# Patient Record
Sex: Female | Born: 1945
Health system: Southern US, Community
[De-identification: ages and names within clinical notes are randomized; demographics above are authoritative.]

## PROBLEM LIST (undated history)

## (undated) DIAGNOSIS — L309 Dermatitis, unspecified: Secondary | ICD-10-CM

## (undated) DIAGNOSIS — K219 Gastro-esophageal reflux disease without esophagitis: Secondary | ICD-10-CM

## (undated) DIAGNOSIS — A4151 Sepsis due to Escherichia coli [E. coli]: Secondary | ICD-10-CM

## (undated) DIAGNOSIS — Z86010 Personal history of colonic polyps: Secondary | ICD-10-CM

## (undated) DIAGNOSIS — K760 Fatty (change of) liver, not elsewhere classified: Secondary | ICD-10-CM

## (undated) DIAGNOSIS — Z9289 Personal history of other medical treatment: Secondary | ICD-10-CM

## (undated) DIAGNOSIS — E119 Type 2 diabetes mellitus without complications: Secondary | ICD-10-CM

## (undated) DIAGNOSIS — M1990 Unspecified osteoarthritis, unspecified site: Secondary | ICD-10-CM

## (undated) DIAGNOSIS — C801 Malignant (primary) neoplasm, unspecified: Secondary | ICD-10-CM

## (undated) DIAGNOSIS — I639 Cerebral infarction, unspecified: Secondary | ICD-10-CM

## (undated) DIAGNOSIS — I10 Essential (primary) hypertension: Secondary | ICD-10-CM

## (undated) HISTORY — DX: Fatty (change of) liver, not elsewhere classified: K76.0

## (undated) HISTORY — PX: CATARACT EXTRACTION: SUR2

## (undated) HISTORY — DX: Type 2 diabetes mellitus without complications: E11.9

## (undated) HISTORY — DX: Essential (primary) hypertension: I10

## (undated) HISTORY — DX: Malignant (primary) neoplasm, unspecified: C80.1

## (undated) HISTORY — PX: MASTECTOMY: SHX3

## (undated) HISTORY — DX: Cerebral infarction, unspecified: I63.9

## (undated) HISTORY — DX: Personal history of colonic polyps: Z86.010

## (undated) HISTORY — PX: TUBAL LIGATION: SHX77

## (undated) HISTORY — DX: Dermatitis, unspecified: L30.9

---

## 1999-10-29 ENCOUNTER — Encounter (INDEPENDENT_AMBULATORY_CARE_PROVIDER_SITE_OTHER): Payer: Self-pay | Admitting: Specialist

## 1999-10-29 ENCOUNTER — Other Ambulatory Visit: Admission: RE | Admit: 1999-10-29 | Discharge: 1999-10-29 | Payer: Self-pay | Admitting: Obstetrics and Gynecology

## 1999-10-31 ENCOUNTER — Encounter: Payer: Self-pay | Admitting: Obstetrics and Gynecology

## 1999-10-31 ENCOUNTER — Encounter: Admission: RE | Admit: 1999-10-31 | Discharge: 1999-10-31 | Payer: Self-pay | Admitting: Obstetrics and Gynecology

## 2002-03-22 ENCOUNTER — Other Ambulatory Visit: Admission: RE | Admit: 2002-03-22 | Discharge: 2002-03-22 | Payer: Self-pay | Admitting: Internal Medicine

## 2003-11-04 ENCOUNTER — Emergency Department (HOSPITAL_COMMUNITY): Admission: EM | Admit: 2003-11-04 | Discharge: 2003-11-05 | Payer: Self-pay | Admitting: Emergency Medicine

## 2004-04-09 ENCOUNTER — Encounter: Admission: RE | Admit: 2004-04-09 | Discharge: 2004-04-09 | Payer: Self-pay | Admitting: Internal Medicine

## 2004-06-14 ENCOUNTER — Ambulatory Visit: Payer: Self-pay | Admitting: Internal Medicine

## 2004-08-16 ENCOUNTER — Ambulatory Visit: Payer: Self-pay | Admitting: Internal Medicine

## 2004-12-13 ENCOUNTER — Ambulatory Visit: Payer: Self-pay | Admitting: Internal Medicine

## 2005-03-13 ENCOUNTER — Other Ambulatory Visit: Admission: RE | Admit: 2005-03-13 | Discharge: 2005-03-13 | Payer: Self-pay | Admitting: Internal Medicine

## 2005-03-13 ENCOUNTER — Ambulatory Visit: Payer: Self-pay | Admitting: Internal Medicine

## 2005-03-17 ENCOUNTER — Ambulatory Visit: Payer: Self-pay | Admitting: Internal Medicine

## 2005-03-20 ENCOUNTER — Ambulatory Visit: Payer: Self-pay | Admitting: Internal Medicine

## 2005-05-06 ENCOUNTER — Ambulatory Visit: Payer: Self-pay | Admitting: Internal Medicine

## 2005-06-17 ENCOUNTER — Ambulatory Visit: Payer: Self-pay | Admitting: Internal Medicine

## 2005-07-11 ENCOUNTER — Ambulatory Visit: Payer: Self-pay | Admitting: Internal Medicine

## 2005-07-28 LAB — HM COLONOSCOPY

## 2005-11-10 ENCOUNTER — Ambulatory Visit: Payer: Self-pay | Admitting: Internal Medicine

## 2006-02-10 ENCOUNTER — Ambulatory Visit: Payer: Self-pay | Admitting: Family Medicine

## 2006-04-02 ENCOUNTER — Encounter (INDEPENDENT_AMBULATORY_CARE_PROVIDER_SITE_OTHER): Payer: Self-pay | Admitting: Specialist

## 2006-04-02 ENCOUNTER — Other Ambulatory Visit: Admission: RE | Admit: 2006-04-02 | Discharge: 2006-04-02 | Payer: Self-pay | Admitting: Internal Medicine

## 2006-04-02 ENCOUNTER — Ambulatory Visit: Payer: Self-pay | Admitting: Internal Medicine

## 2006-04-02 LAB — CONVERTED CEMR LAB: Pap Smear: NORMAL

## 2006-04-02 LAB — HM PAP SMEAR: HM Pap smear: NORMAL

## 2006-04-13 ENCOUNTER — Ambulatory Visit: Payer: Self-pay | Admitting: Family Medicine

## 2006-07-02 ENCOUNTER — Ambulatory Visit: Payer: Self-pay | Admitting: Internal Medicine

## 2006-07-02 LAB — CONVERTED CEMR LAB
ALT: 27 units/L (ref 0–40)
AST: 27 units/L (ref 0–37)
Albumin: 3.9 g/dL (ref 3.5–5.2)
Alkaline Phosphatase: 87 units/L (ref 39–117)
BUN: 11 mg/dL (ref 6–23)
Bilirubin, Direct: 0.1 mg/dL (ref 0.0–0.3)
CO2: 27 meq/L (ref 19–32)
Calcium: 9.7 mg/dL (ref 8.4–10.5)
Chloride: 102 meq/L (ref 96–112)
Chol/HDL Ratio, serum: 3.2
Cholesterol: 170 mg/dL (ref 0–200)
Creatinine, Ser: 1.3 mg/dL — ABNORMAL HIGH (ref 0.4–1.2)
GFR calc non Af Amer: 45 mL/min
Glomerular Filtration Rate, Af Am: 54 mL/min/{1.73_m2}
Glucose, Bld: 328 mg/dL — ABNORMAL HIGH (ref 70–99)
HDL: 52.9 mg/dL (ref >39.0)
Hgb A1c MFr Bld: 8.5 % — ABNORMAL HIGH (ref 4.6–6.0)
LDL DIRECT: 95.8 mg/dL
Potassium: 4.5 meq/L (ref 3.5–5.1)
Sodium: 137 meq/L (ref 135–145)
Total Bilirubin: 0.9 mg/dL (ref 0.3–1.2)
Total Protein: 6.2 g/dL (ref 6.0–8.3)
Triglyceride fasting, serum: 202 mg/dL (ref 0–149)
VLDL: 40 mg/dL (ref 0–40)

## 2006-07-08 ENCOUNTER — Ambulatory Visit: Payer: Self-pay | Admitting: Internal Medicine

## 2006-08-19 ENCOUNTER — Ambulatory Visit: Payer: Self-pay | Admitting: Internal Medicine

## 2006-10-08 ENCOUNTER — Ambulatory Visit: Payer: Self-pay | Admitting: Internal Medicine

## 2006-10-08 LAB — CONVERTED CEMR LAB
ALT: 21 units/L (ref 0–40)
AST: 25 units/L (ref 0–37)
Albumin: 3.7 g/dL (ref 3.5–5.2)
Alkaline Phosphatase: 79 units/L (ref 39–117)
BUN: 10 mg/dL (ref 6–23)
Bilirubin, Direct: 0.1 mg/dL (ref 0.0–0.3)
CO2: 33 meq/L — ABNORMAL HIGH (ref 19–32)
Calcium: 9.6 mg/dL (ref 8.4–10.5)
Chloride: 105 meq/L (ref 96–112)
Cholesterol: 165 mg/dL (ref 0–200)
Creatinine, Ser: 1 mg/dL (ref 0.4–1.2)
GFR calc Af Amer: 73 mL/min
GFR calc non Af Amer: 60 mL/min
Glucose, Bld: 129 mg/dL — ABNORMAL HIGH (ref 70–99)
HDL: 62.6 mg/dL (ref >39.0)
Hgb A1c MFr Bld: 6.7 % — ABNORMAL HIGH (ref 4.6–6.0)
LDL Cholesterol: 77 mg/dL (ref 0–99)
Potassium: 4.5 meq/L (ref 3.5–5.1)
Sodium: 145 meq/L (ref 135–145)
Total Bilirubin: 0.9 mg/dL (ref 0.3–1.2)
Total CHOL/HDL Ratio: 2.6
Total Protein: 7 g/dL (ref 6.0–8.3)
Triglycerides: 128 mg/dL (ref 0–149)
VLDL: 26 mg/dL (ref 0–40)

## 2006-10-29 ENCOUNTER — Ambulatory Visit: Payer: Self-pay | Admitting: Internal Medicine

## 2006-12-10 ENCOUNTER — Encounter: Payer: Self-pay | Admitting: Internal Medicine

## 2006-12-10 ENCOUNTER — Ambulatory Visit: Payer: Self-pay | Admitting: Internal Medicine

## 2006-12-10 DIAGNOSIS — Z853 Personal history of malignant neoplasm of breast: Secondary | ICD-10-CM | POA: Insufficient documentation

## 2006-12-10 DIAGNOSIS — E1129 Type 2 diabetes mellitus with other diabetic kidney complication: Secondary | ICD-10-CM | POA: Insufficient documentation

## 2006-12-10 DIAGNOSIS — Z860100 Personal history of colon polyps, unspecified: Secondary | ICD-10-CM

## 2006-12-10 DIAGNOSIS — E119 Type 2 diabetes mellitus without complications: Secondary | ICD-10-CM

## 2006-12-10 DIAGNOSIS — I10 Essential (primary) hypertension: Secondary | ICD-10-CM | POA: Insufficient documentation

## 2006-12-10 HISTORY — DX: Personal history of colon polyps, unspecified: Z86.0100

## 2006-12-10 HISTORY — DX: Type 2 diabetes mellitus without complications: E11.9

## 2006-12-10 HISTORY — DX: Personal history of colonic polyps: Z86.010

## 2006-12-31 ENCOUNTER — Encounter: Payer: Self-pay | Admitting: Internal Medicine

## 2006-12-31 ENCOUNTER — Ambulatory Visit: Payer: Self-pay | Admitting: Internal Medicine

## 2007-02-01 ENCOUNTER — Encounter: Payer: Self-pay | Admitting: Internal Medicine

## 2007-03-09 ENCOUNTER — Ambulatory Visit: Payer: Self-pay | Admitting: Internal Medicine

## 2007-03-09 LAB — CONVERTED CEMR LAB
ALT: 24 units/L (ref 0–35)
AST: 25 units/L (ref 0–37)
Albumin: 3.7 g/dL (ref 3.5–5.2)
Alkaline Phosphatase: 72 units/L (ref 39–117)
BUN: 11 mg/dL (ref 6–23)
Bilirubin, Direct: 0.2 mg/dL (ref 0.0–0.3)
CO2: 29 meq/L (ref 19–32)
Calcium: 9.7 mg/dL (ref 8.4–10.5)
Chloride: 106 meq/L (ref 96–112)
Cholesterol: 163 mg/dL (ref 0–200)
Creatinine, Ser: 0.9 mg/dL (ref 0.4–1.2)
GFR calc Af Amer: 82 mL/min
GFR calc non Af Amer: 68 mL/min
Glucose, Bld: 172 mg/dL — ABNORMAL HIGH (ref 70–99)
HDL: 60.2 mg/dL (ref >39.0)
Hgb A1c MFr Bld: 6.3 % — ABNORMAL HIGH (ref 4.6–6.0)
LDL Cholesterol: 84 mg/dL (ref 0–99)
Potassium: 4.4 meq/L (ref 3.5–5.1)
Sodium: 141 meq/L (ref 135–145)
Total Bilirubin: 0.9 mg/dL (ref 0.3–1.2)
Total CHOL/HDL Ratio: 2.7
Total Protein: 6.5 g/dL (ref 6.0–8.3)
Triglycerides: 95 mg/dL (ref 0–149)
VLDL: 19 mg/dL (ref 0–40)

## 2007-03-16 ENCOUNTER — Ambulatory Visit: Payer: Self-pay | Admitting: Internal Medicine

## 2007-04-02 ENCOUNTER — Encounter: Payer: Self-pay | Admitting: Internal Medicine

## 2007-05-17 ENCOUNTER — Telehealth: Payer: Self-pay | Admitting: Internal Medicine

## 2007-05-27 ENCOUNTER — Telehealth: Payer: Self-pay | Admitting: Internal Medicine

## 2007-06-01 ENCOUNTER — Ambulatory Visit: Payer: Self-pay | Admitting: Internal Medicine

## 2007-06-17 ENCOUNTER — Encounter: Payer: Self-pay | Admitting: Internal Medicine

## 2007-06-21 ENCOUNTER — Telehealth: Payer: Self-pay | Admitting: Internal Medicine

## 2007-06-30 ENCOUNTER — Ambulatory Visit: Payer: Self-pay | Admitting: Internal Medicine

## 2007-06-30 LAB — CONVERTED CEMR LAB
ALT: 33 units/L (ref 0–35)
AST: 31 units/L (ref 0–37)
Albumin: 3.8 g/dL (ref 3.5–5.2)
Alkaline Phosphatase: 73 units/L (ref 39–117)
BUN: 8 mg/dL (ref 6–23)
Bilirubin, Direct: 0.2 mg/dL (ref 0.0–0.3)
CO2: 28 meq/L (ref 19–32)
Calcium: 9.9 mg/dL (ref 8.4–10.5)
Chloride: 104 meq/L (ref 96–112)
Cholesterol: 148 mg/dL (ref 0–200)
Creatinine, Ser: 0.9 mg/dL (ref 0.4–1.2)
Creatinine,U: 45.4 mg/dL
GFR calc Af Amer: 82 mL/min
GFR calc non Af Amer: 68 mL/min
Glucose, Bld: 126 mg/dL — ABNORMAL HIGH (ref 70–99)
HDL: 51.3 mg/dL (ref >39.0)
Hgb A1c MFr Bld: 7.4 % — ABNORMAL HIGH (ref 4.6–6.0)
LDL Cholesterol: 72 mg/dL (ref 0–99)
Microalb Creat Ratio: 13.2 mg/g (ref 0.0–30.0)
Microalb, Ur: 0.6 mg/dL (ref 0.0–1.9)
Potassium: 4.5 meq/L (ref 3.5–5.1)
Sodium: 141 meq/L (ref 135–145)
Total Bilirubin: 0.5 mg/dL (ref 0.3–1.2)
Total CHOL/HDL Ratio: 2.9
Total Protein: 6.6 g/dL (ref 6.0–8.3)
Triglycerides: 124 mg/dL (ref 0–149)
VLDL: 25 mg/dL (ref 0–40)

## 2007-07-07 ENCOUNTER — Ambulatory Visit: Payer: Self-pay | Admitting: Internal Medicine

## 2007-07-07 LAB — HM DIABETES FOOT EXAM

## 2007-10-18 ENCOUNTER — Ambulatory Visit: Payer: Self-pay | Admitting: Cardiology

## 2007-10-18 ENCOUNTER — Ambulatory Visit: Payer: Self-pay | Admitting: Internal Medicine

## 2007-10-18 ENCOUNTER — Inpatient Hospital Stay (HOSPITAL_COMMUNITY): Admission: EM | Admit: 2007-10-18 | Discharge: 2007-10-19 | Payer: Self-pay | Admitting: Emergency Medicine

## 2007-10-19 ENCOUNTER — Encounter: Payer: Self-pay | Admitting: Internal Medicine

## 2007-10-19 ENCOUNTER — Ambulatory Visit: Payer: Self-pay | Admitting: Vascular Surgery

## 2007-10-20 ENCOUNTER — Ambulatory Visit: Payer: Self-pay | Admitting: Internal Medicine

## 2007-10-20 DIAGNOSIS — Z8673 Personal history of transient ischemic attack (TIA), and cerebral infarction without residual deficits: Secondary | ICD-10-CM | POA: Insufficient documentation

## 2007-10-26 ENCOUNTER — Telehealth: Payer: Self-pay | Admitting: Internal Medicine

## 2007-10-27 ENCOUNTER — Encounter: Payer: Self-pay | Admitting: Internal Medicine

## 2007-11-03 ENCOUNTER — Ambulatory Visit: Payer: Self-pay | Admitting: Internal Medicine

## 2007-11-03 DIAGNOSIS — E785 Hyperlipidemia, unspecified: Secondary | ICD-10-CM | POA: Insufficient documentation

## 2007-11-03 LAB — CONVERTED CEMR LAB
ALT: 29 units/L (ref 0–35)
Calcium: 10.1 mg/dL (ref 8.4–10.5)
Creatinine, Ser: 1.1 mg/dL (ref 0.4–1.2)
GFR calc Af Amer: 65 mL/min
GFR calc non Af Amer: 54 mL/min
HDL: 57.5 mg/dL (ref >39.0)
Hgb A1c MFr Bld: 7.1 % — ABNORMAL HIGH (ref 4.6–6.0)
LDL Cholesterol: 85 mg/dL (ref 0–99)
Total Bilirubin: 1 mg/dL (ref 0.3–1.2)
Total CHOL/HDL Ratio: 2.8
Triglycerides: 101 mg/dL (ref 0–149)
VLDL: 20 mg/dL (ref 0–40)

## 2007-11-10 ENCOUNTER — Ambulatory Visit: Payer: Self-pay | Admitting: Internal Medicine

## 2008-02-02 ENCOUNTER — Telehealth: Payer: Self-pay | Admitting: Internal Medicine

## 2008-03-03 ENCOUNTER — Ambulatory Visit: Payer: Self-pay | Admitting: Internal Medicine

## 2008-03-03 LAB — CONVERTED CEMR LAB
Albumin: 3.9 g/dL (ref 3.5–5.2)
Alkaline Phosphatase: 70 units/L (ref 39–117)
BUN: 12 mg/dL (ref 6–23)
Cholesterol: 154 mg/dL (ref 0–200)
GFR calc Af Amer: 72 mL/min
Glucose, Bld: 164 mg/dL — ABNORMAL HIGH (ref 70–99)
HDL: 54.3 mg/dL (ref >39.0)
Potassium: 4.6 meq/L (ref 3.5–5.1)
Total Protein: 7 g/dL (ref 6.0–8.3)
Triglycerides: 100 mg/dL (ref 0–149)
VLDL: 20 mg/dL (ref 0–40)

## 2008-03-14 ENCOUNTER — Ambulatory Visit: Payer: Self-pay | Admitting: Internal Medicine

## 2008-04-27 ENCOUNTER — Ambulatory Visit: Payer: Self-pay | Admitting: Internal Medicine

## 2008-05-04 ENCOUNTER — Encounter: Payer: Self-pay | Admitting: Internal Medicine

## 2008-05-23 ENCOUNTER — Ambulatory Visit: Payer: Self-pay | Admitting: Family Medicine

## 2008-05-23 ENCOUNTER — Encounter: Payer: Self-pay | Admitting: Internal Medicine

## 2008-06-28 ENCOUNTER — Encounter: Payer: Self-pay | Admitting: Internal Medicine

## 2008-06-30 ENCOUNTER — Ambulatory Visit: Payer: Self-pay | Admitting: Internal Medicine

## 2008-07-03 LAB — CONVERTED CEMR LAB
ALT: 33 units/L (ref 0–35)
AST: 30 units/L (ref 0–37)
Alkaline Phosphatase: 71 units/L (ref 39–117)
Bilirubin, Direct: 0.1 mg/dL (ref 0.0–0.3)
CO2: 28 meq/L (ref 19–32)
Chloride: 104 meq/L (ref 96–112)
Creatinine, Ser: 0.9 mg/dL (ref 0.4–1.2)
Sodium: 140 meq/L (ref 135–145)
Total Bilirubin: 0.7 mg/dL (ref 0.3–1.2)
Total CHOL/HDL Ratio: 3.3
Triglycerides: 99 mg/dL (ref 0–149)

## 2008-07-07 ENCOUNTER — Ambulatory Visit: Payer: Self-pay | Admitting: Internal Medicine

## 2008-10-26 ENCOUNTER — Ambulatory Visit: Payer: Self-pay | Admitting: Internal Medicine

## 2008-10-26 LAB — CONVERTED CEMR LAB
BUN: 11 mg/dL (ref 6–23)
Bilirubin, Direct: 0 mg/dL (ref 0.0–0.3)
Chloride: 102 meq/L (ref 96–112)
Glucose, Bld: 234 mg/dL — ABNORMAL HIGH (ref 70–99)
Hgb A1c MFr Bld: 7.4 % — ABNORMAL HIGH (ref 4.6–6.5)
LDL Cholesterol: 92 mg/dL (ref 0–99)
Potassium: 4.2 meq/L (ref 3.5–5.1)
Total Bilirubin: 0.9 mg/dL (ref 0.3–1.2)
VLDL: 18.8 mg/dL (ref 0.0–40.0)

## 2008-11-23 ENCOUNTER — Ambulatory Visit: Payer: Self-pay | Admitting: Internal Medicine

## 2009-03-14 ENCOUNTER — Ambulatory Visit: Payer: Self-pay | Admitting: Internal Medicine

## 2009-03-14 LAB — CONVERTED CEMR LAB
ALT: 31 units/L (ref 0–35)
Albumin: 3.7 g/dL (ref 3.5–5.2)
GFR calc non Af Amer: 93.36 mL/min (ref >60)
Glucose, Bld: 179 mg/dL — ABNORMAL HIGH (ref 70–99)
HDL: 57.4 mg/dL (ref >39.00)
Potassium: 3.9 meq/L (ref 3.5–5.1)
Sodium: 146 meq/L — ABNORMAL HIGH (ref 135–145)
TSH: 0.96 microintl units/mL (ref 0.35–5.50)
Total Bilirubin: 0.7 mg/dL (ref 0.3–1.2)
Total CHOL/HDL Ratio: 3
VLDL: 27.6 mg/dL (ref 0.0–40.0)

## 2009-03-21 ENCOUNTER — Ambulatory Visit: Payer: Self-pay | Admitting: Internal Medicine

## 2009-05-07 ENCOUNTER — Encounter: Payer: Self-pay | Admitting: Internal Medicine

## 2009-06-04 ENCOUNTER — Encounter (INDEPENDENT_AMBULATORY_CARE_PROVIDER_SITE_OTHER): Payer: Self-pay | Admitting: *Deleted

## 2009-06-05 ENCOUNTER — Ambulatory Visit: Payer: Self-pay | Admitting: Internal Medicine

## 2009-07-05 ENCOUNTER — Ambulatory Visit: Payer: Self-pay | Admitting: Internal Medicine

## 2009-07-06 ENCOUNTER — Encounter: Payer: Self-pay | Admitting: Internal Medicine

## 2009-07-09 LAB — CONVERTED CEMR LAB
Alkaline Phosphatase: 65 units/L (ref 39–117)
Bilirubin, Direct: 0.2 mg/dL (ref 0.0–0.3)
Calcium: 9.6 mg/dL (ref 8.4–10.5)
Creatinine, Ser: 0.9 mg/dL (ref 0.4–1.2)
GFR calc non Af Amer: 81.41 mL/min (ref >60)
HDL: 57.6 mg/dL (ref >39.00)
LDL Cholesterol: 80 mg/dL (ref 0–99)
Sodium: 142 meq/L (ref 135–145)
Total Bilirubin: 0.9 mg/dL (ref 0.3–1.2)
Total CHOL/HDL Ratio: 3
Triglycerides: 119 mg/dL (ref 0.0–149.0)
VLDL: 23.8 mg/dL (ref 0.0–40.0)

## 2009-07-13 ENCOUNTER — Ambulatory Visit: Payer: Self-pay | Admitting: Internal Medicine

## 2009-07-13 DIAGNOSIS — R740 Nonspecific elevation of levels of transaminase and lactic acid dehydrogenase [LDH]: Secondary | ICD-10-CM

## 2009-07-13 DIAGNOSIS — R7401 Elevation of levels of liver transaminase levels: Secondary | ICD-10-CM | POA: Insufficient documentation

## 2009-08-08 ENCOUNTER — Encounter: Payer: Self-pay | Admitting: Internal Medicine

## 2009-08-10 ENCOUNTER — Encounter: Payer: Self-pay | Admitting: Internal Medicine

## 2009-11-01 ENCOUNTER — Ambulatory Visit: Payer: Self-pay | Admitting: Internal Medicine

## 2009-11-01 LAB — CONVERTED CEMR LAB
ALT: 50 units/L — ABNORMAL HIGH (ref 0–35)
AST: 52 units/L — ABNORMAL HIGH (ref 0–37)
Albumin: 4.3 g/dL (ref 3.5–5.2)
BUN: 14 mg/dL (ref 6–23)
Chloride: 104 meq/L (ref 96–112)
Cholesterol: 178 mg/dL (ref 0–200)
Creatinine, Ser: 1 mg/dL (ref 0.4–1.2)
GFR calc non Af Amer: 72.01 mL/min (ref >60)
Glucose, Bld: 152 mg/dL — ABNORMAL HIGH (ref 70–99)
HDL: 75.1 mg/dL (ref >39.00)
LDL Cholesterol: 79 mg/dL (ref 0–99)
Potassium: 4 meq/L (ref 3.5–5.1)
Total Bilirubin: 1.3 mg/dL — ABNORMAL HIGH (ref 0.3–1.2)
Total CHOL/HDL Ratio: 2
Triglycerides: 119 mg/dL (ref 0.0–149.0)

## 2009-11-09 ENCOUNTER — Ambulatory Visit: Payer: Self-pay | Admitting: Internal Medicine

## 2010-01-17 ENCOUNTER — Encounter: Payer: Self-pay | Admitting: Internal Medicine

## 2010-03-01 ENCOUNTER — Ambulatory Visit: Payer: Self-pay | Admitting: Internal Medicine

## 2010-03-01 LAB — CONVERTED CEMR LAB
ALT: 63 units/L — ABNORMAL HIGH (ref 0–35)
Albumin: 4.1 g/dL (ref 3.5–5.2)
Alkaline Phosphatase: 77 units/L (ref 39–117)
BUN: 12 mg/dL (ref 6–23)
Bilirubin, Direct: 0.2 mg/dL (ref 0.0–0.3)
Calcium: 9.7 mg/dL (ref 8.4–10.5)
Cholesterol: 174 mg/dL (ref 0–200)
Creatinine, Ser: 1 mg/dL (ref 0.4–1.2)
GFR calc non Af Amer: 73.64 mL/min (ref >60)
Hgb A1c MFr Bld: 7.1 % — ABNORMAL HIGH (ref 4.6–6.5)
LDL Cholesterol: 82 mg/dL (ref 0–99)
Total Protein: 7.2 g/dL (ref 6.0–8.3)
Triglycerides: 105 mg/dL (ref 0.0–149.0)

## 2010-03-08 ENCOUNTER — Ambulatory Visit: Payer: Self-pay | Admitting: Internal Medicine

## 2010-05-02 ENCOUNTER — Ambulatory Visit: Payer: Self-pay | Admitting: Internal Medicine

## 2010-07-02 ENCOUNTER — Ambulatory Visit: Payer: Self-pay | Admitting: Internal Medicine

## 2010-07-02 ENCOUNTER — Telehealth: Payer: Self-pay | Admitting: Internal Medicine

## 2010-07-02 DIAGNOSIS — L259 Unspecified contact dermatitis, unspecified cause: Secondary | ICD-10-CM | POA: Insufficient documentation

## 2010-07-05 ENCOUNTER — Ambulatory Visit: Payer: Self-pay | Admitting: Internal Medicine

## 2010-07-05 LAB — CONVERTED CEMR LAB
AST: 49 units/L — ABNORMAL HIGH (ref 0–37)
Albumin: 4.2 g/dL (ref 3.5–5.2)
CO2: 26 meq/L (ref 19–32)
Chloride: 105 meq/L (ref 96–112)
Glucose, Bld: 151 mg/dL — ABNORMAL HIGH (ref 70–99)
HDL: 72.7 mg/dL (ref >39.00)
LDL Cholesterol: 107 mg/dL — ABNORMAL HIGH (ref 0–99)
Sodium: 144 meq/L (ref 135–145)
Total Bilirubin: 0.7 mg/dL (ref 0.3–1.2)
Total CHOL/HDL Ratio: 3
Triglycerides: 96 mg/dL (ref 0.0–149.0)

## 2010-07-12 ENCOUNTER — Ambulatory Visit: Payer: Self-pay | Admitting: Internal Medicine

## 2010-08-27 NOTE — Assessment & Plan Note (Signed)
Summary: 4 month rov/njr   Vital Signs:  Patient profile:   65 year old female Weight:      180 pounds Temp:     98.9 degrees F oral Pulse rate:   78 / minute BP sitting:   100 / 60  (left arm) Cuff size:   regular  Vitals Entered By: Romualdo Bolk, CMA (AAMA) (March 08, 2010 11:03 AM) CC: follow-up visit   CC:  follow-up visit.  History of Present Illness:  Follow-Up Visit      This is a 65 year old woman who presents for Follow-up visit.  The patient denies chest pain and palpitations.  Since the last visit the patient notes no new problems or concerns.  The patient reports taking meds as prescribed, not monitoring BP, monitoring blood sugars, and dietary noncompliance.  home cbgs 120-140  All other systems reviewed and were negative   Preventive Screening-Counseling & Management  Alcohol-Tobacco     Alcohol drinks/day: 1     Smoking Status: current     Smoking Cessation Counseling: yes     Packs/Day: <0.25     Year Quit: 2009  Caffeine-Diet-Exercise     Does Patient Exercise: no  Current Problems (verified): 1)  Transaminases, Serum, Elevated  (ICD-790.4) 2)  Tobacco Use-  (ICD-305.1) 3)  Hyperlipidemia  (ICD-272.4) 4)  Cva  (ICD-434.91) 5)  Hypertension  (ICD-401.9) 6)  Diabetes Mellitus, Type II  (ICD-250.00) 7)  Colonic Polyps, Hx of  (ICD-V12.72) 8)  Breast Cancer, Hx of  (ICD-V10.3)  Current Medications (verified): 1)  Atenolol 100 Mg Tabs (Atenolol) .... Take 1 Tablet By Mouth Two Times A Day 2)  Betamethasone Dipropionate 0.05 % Oint (Betamethasone Dipropionate) .... Apply Two Times A Day As Needed To Affected Area 3)  Glipizide 5 Mg Tb24 (Glipizide) .... Take 1 Tablet By Mouth Two Times A Day 4)  Januvia 100 Mg Tabs (Sitagliptin Phosphate) .... Take 1 Tablet By Mouth Once A Day 5)  Micardis 80 Mg Tabs (Telmisartan) .... Take 1 Tablet By Mouth Once A Day 6)  Quinapril Hcl 20 Mg Tabs (Quinapril Hcl) .... One Bid 7)  Aspirin 325 Mg  Tabs (Aspirin)  .... Once Daily 8)  Onetouch Test   Strp (Glucose Blood) .... Two Times A Day As Directed 9)  Mucinex Dm 30-600 Mg Xr12h-Tab (Dextromethorphan-Guaifenesin) .... Two Times A Day As Needed 10)  Calcium Plus Vitamin D 600-100 Mg-Unit Caps (Calcium Carbonate-Vitamin D) .... Take One Tab Twice Daily 11)  Hydroxyzine Hcl 25 Mg Tabs (Hydroxyzine Hcl) .... Take 1 Tablet By Mouth At Bedtime As Needed Itching  Allergies (verified): 1)  Thalitone (Chlorthalidone) 2)  Metformin Hcl (Metformin Hcl) 3)  Penicillin G Potassium (Penicillin G Potassium) 4)  Sulfamethoxazole (Sulfamethoxazole)  Past History:  Past Medical History: Last updated: 10/20/2007 Breast cancer, hx of--no adjunctive Rx (no chemo, no RT) Colonic polyps, hx of Diabetes mellitus, type II Hypertension Cerebrovascular accident, hx of  Past Surgical History: Last updated: 02/22/2007 Mastectomy   ~1980  Family History: Last updated: 12/10/2006 mother-stroke father prostate CA Family History of Prostate CA 1st degree relative <65  Social History: Last updated: 12/10/2006 Married Current Smoker Alcohol use-yes  Risk Factors: Alcohol Use: 1 (03/08/2010) Exercise: no (03/08/2010)  Risk Factors: Smoking Status: current (03/08/2010) Packs/Day: <0.25 (03/08/2010)  Physical Exam  General:  alert and well-developed.   Head:  normocephalic and atraumatic.   Eyes:  pupils equal and pupils round.   Ears:  R ear normal and L  ear normal.   Neck:  No deformities, masses, or tenderness noted. Chest Wall:  No deformities, masses, or tenderness noted. Lungs:  normal respiratory effort and no intercostal retractions.   Heart:  normal rate and regular rhythm.   Abdomen:  Bowel sounds positive,abdomen soft and non-tender without masses, organomegaly or hernias noted. Msk:  No deformity or scoliosis noted of thoracic or lumbar spine.   Neurologic:  cranial nerves II-XII intact and gait normal.     Impression &  Recommendations:  Problem # 1:  DIABETES MELLITUS, TYPE II (ICD-250.00) controlled continue current medications  Her updated medication list for this problem includes:    Glipizide 5 Mg Tb24 (Glipizide) .[name]... Take 1 tablet by mouth two times a day    Januvia 100 Mg Tabs (Sitagliptin phosphate) .[name]... Take 1 tablet by mouth once a day    Micardis 80 Mg Tabs (Telmisartan) .[name]... Take 1 tablet by mouth once a day    Quinapril Hcl 20 Mg Tabs (Quinapril hcl) ..... One bid    Aspirin 325 Mg Tabs (Aspirin) ..... Once daily  Labs Reviewed: Creat: 1.0 (03/01/2010)     Last Eye Exam: normalpt's report (06/27/2009) Reviewed HgBA1c results: 7.1 (03/01/2010)  7.0 (11/01/2009)  Problem # 2:  TOBACCO USE- (ICD-305.1)  Encouraged smoking cessation and discussed different methods for smoking cessation.   Problem # 3:  HYPERLIPIDEMIA (ICD-272.4) not on any statin no need to treat Labs Reviewed: SGOT: 61 (03/01/2010)   SGPT: 63 (03/01/2010)  Prior 10 Yr Risk Heart Disease: Not enough information (11/10/2007)   HDL:70.80 (03/01/2010), 75.10 (11/01/2009)  LDL:82 (03/01/2010), 79 (11/01/2009)  Chol:174 (03/01/2010), 178 (11/01/2009)  Trig:105.0 (03/01/2010), 119.0 (11/01/2009)  Problem # 4:  TRANSAMINASES, SERUM, ELEVATED (ICD-790.4) minimally elevated weight loss is key  Problem # 5:  BREAST CANCER, HX OF (ICD-V10.3) no known recurrence  Complete Medication List: 1)  Atenolol 100 Mg Tabs (Atenolol) .... Take 1 tablet by mouth two times a day 2)  Betamethasone Dipropionate 0.05 % Oint (Betamethasone dipropionate) .... Apply two times a day as needed to affected area 3)  Glipizide 5 Mg Tb24 (Glipizide) .... Take 1 tablet by mouth two times a day 4)  Januvia 100 Mg Tabs (Sitagliptin phosphate) .... Take 1 tablet by mouth once a day 5)  Micardis 80 Mg Tabs (Telmisartan) .... Take 1 tablet by mouth once a day 6)  Quinapril Hcl 20 Mg Tabs (Quinapril hcl) .... One bid 7)  Aspirin 325 Mg Tabs  (Aspirin) .... Once daily 8)  Onetouch Test Strp (Glucose blood) .... Two times a day as directed 9)  Mucinex Dm 30-600 Mg Xr12h-tab (Dextromethorphan-guaifenesin) .... Two times a day as needed 10)  Calcium Plus Vitamin D 600-100 Mg-unit Caps (Calcium carbonate-vitamin d) .... Take one tab twice daily 11)  Hydroxyzine Hcl 25 Mg Tabs (Hydroxyzine hcl) .... Take 1 tablet by mouth at bedtime as needed itching  Patient Instructions: 1)  Please schedule a follow-up appointment in 4 months. 2)  labs one week prior to visit 3)  lipids---272.4 4)  lfts-995.2 5)  bmet-995.2 6)  A1C-250.02 7)

## 2010-08-27 NOTE — Assessment & Plan Note (Signed)
Summary: FLU-SHOT/RCD  Nurse Visit          Flu Vaccine Consent Questions     Do you have a history of severe allergic reactions to this vaccine? no    Any prior history of allergic reactions to egg and/or gelatin? no    Do you have a sensitivity to the preservative Thimersol? no    Do you have a past history of Guillan-Barre Syndrome? no    Do you currently have an acute febrile illness? no    Have you ever had a severe reaction to latex? no    Vaccine information given and explained to patient? yes    Are you currently pregnant? no    Lot Number:[id number]   Exp Date:01/25/2011   Site Given  Left Deltoid IM   Allergies: 1)  Thalitone (Chlorthalidone) 2)  Metformin Hcl (Metformin Hcl) 3)  Penicillin G Potassium (Penicillin G Potassium) 4)  Sulfamethoxazole (Sulfamethoxazole)  Orders Added: 1)  Admin 1st Vaccine [90471] 2)  Flu Vaccine 84yrs + [27253]

## 2010-08-27 NOTE — Letter (Signed)
Summary: [facility]   Imported By: Maryln Gottron 08/30/2009 14:37:52  _____________________________________________________________________  External Attachment:    Type:   Image     Comment:   External Document

## 2010-08-27 NOTE — Assessment & Plan Note (Signed)
Summary: 6 month rov/njr   Vital Signs:  Patient profile:   65 year old female Weight:      181 pounds Temp:     97.9 degrees F oral Pulse rate:   84 / minute Pulse rhythm:   regular Resp:     12 per minute BP sitting:   118 / 66  (right arm) Cuff size:   regular  Vitals Entered By: Gladis Riffle, RN (November 09, 2009 11:28 AM) CC: 6 month rov, labs done Is Patient Diabetic? No   CC:  6 month rov and labs done.  History of Present Illness:  Follow-Up Visit      This is a Anita Mccormick who presents for Follow-up visit.  The patient denies chest pain and palpitations.  Since the last visit the patient notes no new problems or concerns.  The patient reports taking meds as prescribed, dietary noncompliance, and not exercising.  When questioned about possible medication side effects, the patient notes none.    All other systems reviewed and were negative   Preventive Screening-Counseling & Management  Alcohol-Tobacco     Alcohol drinks/day: 1     Smoking Status: current     Smoking Cessation Counseling: yes     Packs/Day: <0.25  Current Problems (verified): 1)  Transaminases, Serum, Elevated  (ICD-790.4) 2)  Tobacco Use-  (ICD-305.1) 3)  Hyperlipidemia  (ICD-272.4) 4)  Cva  (ICD-434.91) 5)  Hypertension  (ICD-401.9) 6)  Diabetes Mellitus, Type II  (ICD-250.00) 7)  Colonic Polyps, Hx of  (ICD-V12.72) 8)  Breast Cancer, Hx of  (ICD-V10.3)  Current Medications (verified): 1)  Atenolol 100 Mg Tabs (Atenolol) .... Take 1 Tablet By Mouth Two Times A Day 2)  Betamethasone Dipropionate 0.05 % Oint (Betamethasone Dipropionate) .... Apply Two Times A Day As Needed To Affected Area 3)  Glipizide 5 Mg Tb24 (Glipizide) .... Take 1 Tablet By Mouth Two Times A Day 4)  Januvia 100 Mg Tabs (Sitagliptin Phosphate) .... Take 1 Tablet By Mouth Once A Day 5)  Micardis 80 Mg Tabs (Telmisartan) .... Take 1 Tablet By Mouth Once A Day 6)  Quinapril Hcl 20 Mg Tabs (Quinapril Hcl) .... One Bid 7)   Aspirin 325 Mg  Tabs (Aspirin) .... Once Daily 8)  Onetouch Test   Strp (Glucose Blood) .... Two Times A Day As Directed 9)  Mucinex Dm 30-600 Mg Xr12h-Tab (Dextromethorphan-Guaifenesin) .... Two Times A Day As Needed 10)  Calcium Plus Vitamin D 600-100 Mg-Unit Caps (Calcium Carbonate-Vitamin D) .... Take One Tab Twice Daily 11)  Hydroxyzine Hcl 25 Mg Tabs (Hydroxyzine Hcl) .... Take 1 Tablet By Mouth At Bedtime As Needed Itching  Allergies: 1)  Thalitone (Chlorthalidone) 2)  Metformin Hcl (Metformin Hcl) 3)  Penicillin G Potassium (Penicillin G Potassium) 4)  Sulfamethoxazole (Sulfamethoxazole)  Review of Systems       All other systems reviewed and were negative   Physical Exam  General:  alert and well-developed.   Head:  normocephalic and atraumatic.   Eyes:  pupils equal and pupils round.   Ears:  R ear normal and L ear normal.   Neck:  No deformities, masses, or tenderness noted. Chest Wall:  No deformities, masses, or tenderness noted. Lungs:  normal respiratory effort and no intercostal retractions.   Heart:  normal rate and regular rhythm.   Abdomen:  Bowel sounds positive,abdomen soft and non-tender without masses, organomegaly or hernias noted. Msk:  No deformity or scoliosis noted of thoracic or lumbar  spine.   Extremities:  No clubbing, cyanosis, edema, or deformity noted  Neurologic:  cranial nerves II-XII intact and gait normal.   Skin:  turgor normal and color normal.   Psych:  memory intact for recent and remote and good eye contact.     Impression & Recommendations:  Problem # 1:  TRANSAMINASES, SERUM, ELEVATED (ICD-790.4) Assessment New minor elevation weight loss is key  Problem # 2:  HYPERLIPIDEMIA (ICD-272.4) resolved continue current medications  Labs Reviewed: SGOT: 52 (11/01/2009)   SGPT: 50 (11/01/2009)  Prior 10 Yr Risk Heart Disease: Not enough information (11/10/2007)   HDL:75.10 (11/01/2009), 57.60 (07/05/2009)  LDL:79 (11/01/2009), 80  (56/21/3086)  Chol:178 (11/01/2009), 161 (07/05/2009)  Trig:119.0 (11/01/2009), 119.0 (07/05/2009)  Problem # 3:  DIABETES MELLITUS, TYPE II (ICD-250.00) fair control  continue current medications  Her updated medication list for this problem includes:    Glipizide 5 Mg Tb24 (Glipizide) .[name]... Take 1 tablet by mouth two times a day    Januvia 100 Mg Tabs (Sitagliptin phosphate) .[name]... Take 1 tablet by mouth once a day    Micardis 80 Mg Tabs (Telmisartan) .[name]... Take 1 tablet by mouth once a day    Quinapril Hcl 20 Mg Tabs (Quinapril hcl) ..... One bid    Aspirin 325 Mg Tabs (Aspirin) ..... Once daily  Labs Reviewed: Creat: 1.0 (11/01/2009)     Last Eye Exam: normalpt's report (06/27/2009) Reviewed HgBA1c results: 7.0 (11/01/2009)  6.9 (07/05/2009)  Problem # 4:  HYPERTENSION (ICD-401.9) controlled continue current medications  Her updated medication list for this problem includes:    Atenolol 100 Mg Tabs (Atenolol) .[name]... Take 1 tablet by mouth two times a day    Micardis 80 Mg Tabs (Telmisartan) .[name]... Take 1 tablet by mouth once a day    Quinapril Hcl 20 Mg Tabs (Quinapril hcl) ..... One bid  BP today: 118/66 Prior BP: 102/64 (07/13/2009)  Prior 10 Yr Risk Heart Disease: Not enough information (11/10/2007)  Labs Reviewed: K+: 4.0 (11/01/2009) Creat: : 1.0 (11/01/2009)   Chol: 178 (11/01/2009)   HDL: 75.10 (11/01/2009)   LDL: 79 (11/01/2009)   TG: 119.0 (11/01/2009)  Complete Medication List: 1)  Atenolol 100 Mg Tabs (Atenolol) .... Take 1 tablet by mouth two times a day 2)  Betamethasone Dipropionate 0.05 % Oint (Betamethasone dipropionate) .... Apply two times a day as needed to affected area 3)  Glipizide 5 Mg Tb24 (Glipizide) .... Take 1 tablet by mouth two times a day 4)  Januvia 100 Mg Tabs (Sitagliptin phosphate) .... Take 1 tablet by mouth once a day 5)  Micardis 80 Mg Tabs (Telmisartan) .... Take 1 tablet by mouth once a day 6)  Quinapril Hcl 20 Mg Tabs (Quinapril hcl)  .... One bid 7)  Aspirin 325 Mg Tabs (Aspirin) .... Once daily 8)  Onetouch Test Strp (Glucose blood) .... Two times a day as directed 9)  Mucinex Dm 30-600 Mg Xr12h-tab (Dextromethorphan-guaifenesin) .... Two times a day as needed 10)  Calcium Plus Vitamin D 600-100 Mg-unit Caps (Calcium carbonate-vitamin d) .... Take one tab twice daily 11)  Hydroxyzine Hcl 25 Mg Tabs (Hydroxyzine hcl) .... Take 1 tablet by mouth at bedtime as needed itching  Patient Instructions: 1)  Please schedule a follow-up appointment in 4 months. 2)  labs one week prior to visit 3)  lipids---272.4 4)  lfts-995.2 5)  bmet-995.2 6)  A1C-250.02 7)     Prescriptions: MICARDIS 80 MG TABS (TELMISARTAN) Take 1 tablet by mouth once a day  #  90 x 3   Entered and Authorized by:   Birdie Sons MD   Signed by:   Birdie Sons MD on 11/09/2009   Method used:   Electronically to        [facility] [address] [id number]* (retail)       [address]       [place], [place]  [address]       Ph: [phone] or [phone]       Fax: [phone]   RxID:   [id number] JANUVIA 100 MG TABS (SITAGLIPTIN PHOSPHATE) Take 1 tablet by mouth once a day  #90 x 3   Entered and Authorized by:   Birdie Sons MD   Signed by:   Birdie Sons MD on 11/09/2009   Method used:   [facility] to        [facility] [address] [id number]* (retail)       [address]       [place], [place]  [address]       Ph: [phone] or [phone]       Fax: [phone]   RxID:   [id number]

## 2010-08-27 NOTE — Assessment & Plan Note (Signed)
Summary: Rash/ok per debby/cb   Vital Signs:  Patient profile:   65 year old female Weight:      176 pounds Temp:     98.7 degrees F oral BP sitting:   120 / 80  (right arm)  Vitals Entered By: Duard Brady LPN (July 02, 2010 1:49 PM) CC: itchy rash    bs-197 Is Patient Diabetic? Yes   CC:  itchy rash    bs-197.  History of Present Illness: 65 -year-old patient who has a history of eczema and is being followed by dermatology.  She has been on topical steroids as well as hydroxyzine for itching.  For the past 3 days.  She has had increase in her rash involving the volar aspects of both arms.  She also describes some generalized itching.  She did see dermatology approximately 3 weeks ago.  She has type 2 diabetes and hypertension.  She has not been using her topical steroids  Allergies: 1)  Thalitone (Chlorthalidone) 2)  Metformin Hcl (Metformin Hcl) 3)  Penicillin G Potassium (Penicillin G Potassium) 4)  Sulfamethoxazole (Sulfamethoxazole)  Past History:  Past Medical History: Breast cancer, hx of--no adjunctive Rx (no chemo, no RT) Colonic polyps, hx of Diabetes mellitus, type II Hypertension Cerebrovascular accident, hx of eczema  Review of Systems       The patient complains of suspicious skin lesions.  The patient denies anorexia, fever, weight loss, weight gain, vision loss, decreased hearing, hoarseness, chest pain, syncope, dyspnea on exertion, peripheral edema, prolonged cough, headaches, hemoptysis, abdominal pain, melena, hematochezia, severe indigestion/heartburn, hematuria, incontinence, genital sores, muscle weakness, transient blindness, difficulty walking, depression, unusual weight change, abnormal bleeding, enlarged lymph nodes, angioedema, and breast masses.    Physical Exam  General:  overweight-appearing.  no distressoverweight-appearing.   Neck:  No deformities, masses, or tenderness noted. Lungs:  Normal respiratory effort, chest expands  symmetrically. Lungs are clear to auscultation, no crackles or wheezes. Heart:  Normal rate and regular rhythm. S1 and S2 normal without gallop, murmur, click, rub or other extra sounds. Skin:  erythematous slightly, dry rash involving the inner aspects of both arms.  She has scattered areas of facial hyperpigmentation as well as hyperpigmentation involving areas of the back and chronic that appear to be related to excoriations or postinflammatory   Impression & Recommendations:  Problem # 1:  ECZEMA (ICD-692.9)  Her updated medication list for this problem includes:    Betamethasone Dipropionate 0.05 % Oint (Betamethasone dipropionate) .[name]... Apply two times a day as needed to affected area  Her updated medication list for this problem includes:    Betamethasone Dipropionate 0.05 % Oint (Betamethasone dipropionate) .[name]... Apply two times a day as needed to affected area  Problem # 2:  HYPERTENSION (ICD-401.9)  Her updated medication list for this problem includes:    Atenolol 100 Mg Tabs (Atenolol) .[name]... Take 1 tablet by mouth two times a day    Micardis 80 Mg Tabs (Telmisartan) .[name]... Take 1 tablet by mouth once a day    Quinapril Hcl 20 Mg Tabs (Quinapril hcl) ..... One bid  Her updated medication list for this problem includes:    Atenolol 100 Mg Tabs (Atenolol) .[name]... Take 1 tablet by mouth two times a day    Micardis 80 Mg Tabs (Telmisartan) .[name]... Take 1 tablet by mouth once a day    Quinapril Hcl 20 Mg Tabs (Quinapril hcl) ..... One bid  Complete Medication List: 1)  Atenolol 100 Mg Tabs (Atenolol) .... Take  1 tablet by mouth two times a day 2)  Betamethasone Dipropionate 0.05 % Oint (Betamethasone dipropionate) .... Apply two times a day as needed to affected area 3)  Glipizide 5 Mg Tb24 (Glipizide) .... Take 1 tablet by mouth two times a day 4)  Januvia 100 Mg Tabs (Sitagliptin phosphate) .... Take 1 tablet by mouth once a day 5)  Micardis 80 Mg Tabs (Telmisartan) .... Take 1  tablet by mouth once a day 6)  Quinapril Hcl 20 Mg Tabs (Quinapril hcl) .... One bid 7)  Aspirin 325 Mg Tabs (Aspirin) .... Once daily 8)  Onetouch Test Strp (Glucose blood) .... Two times a day as directed 9)  Mucinex Dm 30-600 Mg Xr12h-tab (Dextromethorphan-guaifenesin) .... Two times a day as needed 10)  Calcium Plus Vitamin D 600-100 Mg-unit Caps (Calcium carbonate-vitamin d) .... Take one tab twice daily 11)  Hydroxyzine Hcl 25 Mg Tabs (Hydroxyzine hcl) .... Take 1 tablet by mouth at bedtime as needed itching  Patient Instructions: 1)  Please schedule a follow-up appointment in 3 months. 2)  Limit your Sodium (Salt) to less than 2 grams a day(slightly less than 1/2 a teaspoon) to prevent fluid retention, swelling, or worsening of symptoms. 3)  follow-up dermatology if unimproved   Orders Added: 1)  Est. Patient Level III [16109]

## 2010-08-27 NOTE — Medication Information (Signed)
Summary: Order for Mastectomy Supplies  Order for Mastectomy Supplies   Imported By: Maryln Gottron 01/21/2010 14:05:03  _____________________________________________________________________  External Attachment:    Type:   Image     Comment:   External Document

## 2010-08-27 NOTE — Progress Notes (Signed)
  Phone Note Call from Patient   Caller: Patient Call For: Birdie Sons MD Summary of Call: Pt walks in office with diffuse red, itchy rash.  Has been taking Benadryl for the itching, with little relief.  Will work in schedule. Initial call taken by: [facility] CMA AAMA,  July 02, 2010 1:54 PM

## 2010-08-29 NOTE — Assessment & Plan Note (Signed)
Summary: 4 MONTH ROV/NJR   Vital Signs:  Patient profile:   65 year old female Weight:      178 pounds Temp:     99.2 degrees F oral Pulse rate:   72 / minute Pulse rhythm:   regular BP sitting:   136 / 78  (right arm) Cuff size:   regular  Vitals Entered By: Alfred Levins, CMA (July 12, 2010 10:28 AM) CC: f/u   CC:  f/u.  History of Present Illness:  Follow-Up Visit      This is a 65 year old woman who presents for Follow-up visit.  The patient denies chest pain and palpitations.  Since the last visit the patient notes no new problems or concerns.  The patient reports taking meds as prescribed.  When questioned about possible medication side effects, the patient notes none.    All other systems reviewed and were negative   Current Medications (verified): 1)  Atenolol 100 Mg Tabs (Atenolol) .... Take 1 Tablet By Mouth Two Times A Day 2)  Glipizide 5 Mg Tb24 (Glipizide) .... Take 1 Tablet By Mouth Two Times A Day 3)  Januvia 100 Mg Tabs (Sitagliptin Phosphate) .... Take 1 Tablet By Mouth Once A Day 4)  Micardis 80 Mg Tabs (Telmisartan) .... Take 1 Tablet By Mouth Once A Day 5)  Quinapril Hcl 20 Mg Tabs (Quinapril Hcl) .... One Bid 6)  Aspirin 325 Mg  Tabs (Aspirin) .... Once Daily 7)  Onetouch Test   Strp (Glucose Blood) .... Two Times A Day As Directed 8)  Calcium Plus Vitamin D 600-100 Mg-Unit Caps (Calcium Carbonate-Vitamin D) .... Take One Tab Twice Daily 9)  Hydroxyzine Hcl 25 Mg Tabs (Hydroxyzine Hcl) .... Take 1 Tablet By Mouth At Bedtime As Needed Itching  Allergies (verified): 1)  Thalitone (Chlorthalidone) 2)  Metformin Hcl (Metformin Hcl) 3)  Penicillin G Potassium (Penicillin G Potassium) 4)  Sulfamethoxazole (Sulfamethoxazole)  Physical Exam  General:  overweight female in no acute distress. HEENT exam atraumatic, normocephalic symmetric her muscles are intact. Neck is supple without jugular venous distention. Chest her auscultation cardiac exam S1-S2 are  regular. Abdominal exam obese, to bowel sounds, soft her extremities no edema.   Impression & Recommendations:  Problem # 1:  ECZEMA (ICD-692.9) improved  Problem # 2:  TOBACCO USE- (ICD-305.1)  Encouraged smoking cessation and discussed different methods for smoking cessation.   Problem # 3:  HYPERLIPIDEMIA (ICD-272.4) note HDL Labs Reviewed: SGOT: 49 (07/05/2010)   SGPT: 58 (07/05/2010)  Prior 10 Yr Risk Heart Disease: Not enough information (11/10/2007)   HDL:72.70 (07/05/2010), 70.80 (03/01/2010)  LDL:107 (07/05/2010), 82 (21/30/8657)  Chol:199 (07/05/2010), 174 (03/01/2010)  Trig:96.0 (07/05/2010), 105.0 (03/01/2010)  Problem # 4:  DIABETES MELLITUS, TYPE II (ICD-250.00)  Her updated medication list for this problem includes:    Glipizide 5 Mg Tb24 (Glipizide) .[name]... Take 1 tablet by mouth two times a day    Januvia 100 Mg Tabs (Sitagliptin phosphate) .[name]... Take 1 tablet by mouth once a day    Micardis 80 Mg Tabs (Telmisartan) .[name]... Take 1 tablet by mouth once a day    Quinapril Hcl 20 Mg Tabs (Quinapril hcl) ..... One bid    Aspirin 325 Mg Tabs (Aspirin) ..... Once daily  Labs Reviewed: Creat: 1.0 (07/05/2010)     Last Eye Exam: normalpt's report (06/27/2009) Reviewed HgBA1c results: 6.5 (07/05/2010)  7.1 (03/01/2010)  Problem # 5:  HYPERTENSION (ICD-401.9) repeat bp 136/78 Her updated medication list for this problem includes:  Atenolol 100 Mg Tabs (Atenolol) .[name]... Take 1 tablet by mouth two times a day    Micardis 80 Mg Tabs (Telmisartan) .[name]... Take 1 tablet by mouth once a day    Quinapril Hcl 20 Mg Tabs (Quinapril hcl) ..... One bid  Complete Medication List: 1)  Atenolol 100 Mg Tabs (Atenolol) .... Take 1 tablet by mouth two times a day 2)  Glipizide 5 Mg Tb24 (Glipizide) .... Take 1 tablet by mouth two times a day 3)  Januvia 100 Mg Tabs (Sitagliptin phosphate) .... Take 1 tablet by mouth once a day 4)  Micardis 80 Mg Tabs (Telmisartan) .... Take 1 tablet  by mouth once a day 5)  Quinapril Hcl 20 Mg Tabs (Quinapril hcl) .... One bid 6)  Aspirin 325 Mg Tabs (Aspirin) .... Once daily 7)  Onetouch Test Strp (Glucose blood) .... Two times a day as directed 8)  Calcium Plus Vitamin D 600-100 Mg-unit Caps (Calcium carbonate-vitamin d) .... Take one tab twice daily 9)  Hydroxyzine Hcl 25 Mg Tabs (Hydroxyzine hcl) .... Take 1 tablet by mouth at bedtime as needed itching   Patient Instructions: 1)  Please schedule a follow-up appointment in 4 months. 2)  labs one week prior to visit 3)  lipids---272.4 4)  lfts-995.2 5)  bmet-995.2 6)  A1C-250.02 7)       Orders Added: 1)  Est. Patient Level IV [08657]

## 2010-09-02 ENCOUNTER — Other Ambulatory Visit: Payer: Self-pay | Admitting: Internal Medicine

## 2010-09-02 DIAGNOSIS — E119 Type 2 diabetes mellitus without complications: Secondary | ICD-10-CM

## 2010-09-18 ENCOUNTER — Encounter: Payer: Self-pay | Admitting: Internal Medicine

## 2010-09-19 ENCOUNTER — Ambulatory Visit (INDEPENDENT_AMBULATORY_CARE_PROVIDER_SITE_OTHER): Payer: 59 | Admitting: Internal Medicine

## 2010-09-19 ENCOUNTER — Encounter: Payer: Self-pay | Admitting: Internal Medicine

## 2010-09-19 DIAGNOSIS — M79609 Pain in unspecified limb: Secondary | ICD-10-CM

## 2010-09-19 DIAGNOSIS — M79605 Pain in left leg: Secondary | ICD-10-CM

## 2010-09-19 NOTE — Progress Notes (Signed)
  Subjective:    Patient ID: Anita Mccormick, female    DOB: 06/13/1946, 65 y.o.   MRN: [id number]  HPI  Left leg---she has noted some discoloration and aching of left leg.   duration: 2 weeks. She denies trauma. No associated symptoms. No significant swelling.  Past Medical History  Diagnosis Date  . Cancer     history of no adjunctive RX( no chemo no RT)  . Hx of colonic polyps   . Diabetes mellitus     Type 2  . Hypertension   . Cerebrovascular accident   . Eczema    Past Surgical History  Procedure Date  . Mastectomy     reports that she has been smoking Cigarettes.  She has been smoking about .5 packs per day. She does not have any smokeless tobacco history on file. She reports that she drinks alcohol. Her drug history not on file. family history includes Cancer in her father and Stroke in her mother.     Review of Systems Call patient. Laboratory work okay.     Objective:   Physical Exam  Well-developed female in no acute distress. HEENT exam atraumatic, normocephalic, neck supple. Chest clear to auscultation cardiac exam S1-S2 are regular. Extremities no clubbing cyanosis or edema. No Homans sign.       Assessment & Plan:  Left leg pain---ultrasound leg

## 2010-09-20 ENCOUNTER — Encounter (INDEPENDENT_AMBULATORY_CARE_PROVIDER_SITE_OTHER): Payer: 59

## 2010-09-20 DIAGNOSIS — M79609 Pain in unspecified limb: Secondary | ICD-10-CM

## 2010-09-24 NOTE — Miscellaneous (Signed)
Summary: Orders Update  Clinical Lists Changes  Problems: Added new problem of LEG PAIN, LEFT (ICD-729.5) Orders: Added new Test order of Venous Duplex Lower Extremity (Venous Duplex Lower) - Signed 

## 2010-11-06 ENCOUNTER — Other Ambulatory Visit (INDEPENDENT_AMBULATORY_CARE_PROVIDER_SITE_OTHER): Payer: 59 | Admitting: Internal Medicine

## 2010-11-06 DIAGNOSIS — E119 Type 2 diabetes mellitus without complications: Secondary | ICD-10-CM

## 2010-11-06 DIAGNOSIS — E785 Hyperlipidemia, unspecified: Secondary | ICD-10-CM

## 2010-11-06 DIAGNOSIS — T887XXA Unspecified adverse effect of drug or medicament, initial encounter: Secondary | ICD-10-CM

## 2010-11-06 LAB — LIPID PANEL
LDL Cholesterol: 75 mg/dL (ref 0–99)
Total CHOL/HDL Ratio: 2
VLDL: 21 mg/dL (ref 0.0–40.0)

## 2010-11-06 LAB — HEPATIC FUNCTION PANEL
Bilirubin, Direct: 0.1 mg/dL (ref 0.0–0.3)
Total Bilirubin: 0.7 mg/dL (ref 0.3–1.2)

## 2010-11-06 LAB — BASIC METABOLIC PANEL
BUN: 16 mg/dL (ref 6–23)
CO2: 29 mEq/L (ref 19–32)
Chloride: 108 mEq/L (ref 96–112)
Glucose, Bld: 75 mg/dL (ref 70–99)
Potassium: 4.4 mEq/L (ref 3.5–5.1)

## 2010-11-13 ENCOUNTER — Ambulatory Visit (INDEPENDENT_AMBULATORY_CARE_PROVIDER_SITE_OTHER): Payer: 59 | Admitting: Internal Medicine

## 2010-11-13 ENCOUNTER — Encounter: Payer: Self-pay | Admitting: Internal Medicine

## 2010-11-13 VITALS — BP 124/74 | HR 72 | Temp 98.3°F | Ht 62.0 in | Wt 174.0 lb

## 2010-11-13 DIAGNOSIS — M79609 Pain in unspecified limb: Secondary | ICD-10-CM

## 2010-11-13 DIAGNOSIS — E119 Type 2 diabetes mellitus without complications: Secondary | ICD-10-CM

## 2010-11-13 MED ORDER — GLIPIZIDE ER 5 MG PO TB24: 5.0000 mg | ORAL_TABLET | Freq: Every day | ORAL | Status: DC

## 2010-11-13 NOTE — Assessment & Plan Note (Signed)
Suspect radiculopathy Needs further eval given duration and severity

## 2010-11-13 NOTE — Progress Notes (Signed)
  Subjective:    Patient ID: Anita Mccormick, female    DOB: May 12, 1946, 65 y.o.   MRN: [id number]  HPI  Recent dx of folliculitis---treated by derm with doxycycline  Continues to have left leg discomfort and numbness.   HTN---tolerating meds  DM---tolerating meds. Home CBGs 120s generally  Past Medical History  Diagnosis Date  . Cancer     history of no adjunctive RX( no chemo no RT)  . Hx of colonic polyps   . Diabetes mellitus     Type 2  . Hypertension   . Cerebrovascular accident   . Eczema    Past Surgical History  Procedure Date  . Mastectomy     reports that she has been smoking Cigarettes.  She has been smoking about .5 packs per day. She does not have any smokeless tobacco history on file. She reports that she drinks alcohol. Her drug history not on file. family history includes Cancer in her father and Stroke in her mother. Allergies  Allergen Reactions  . Chlorthalidone     REACTION: unspecified  . Metformin     REACTION: gi side effects  . Penicillins     REACTION: urticaria (hives)  . Sulfamethoxazole     REACTION: questionable     Review of Systems  patient denies chest pain, shortness of breath, orthopnea. Denies lower extremity edema, abdominal pain, change in appetite, change in bowel movements. Patient denies rashes, musculoskeletal complaints (except for that listed in HPI). No other specific complaints in a complete review of systems.      Objective:   Physical Exam  Well-developed well-nourished female in no acute distress. HEENT exam atraumatic, normocephalic, extraocular muscles are intact. Neck is supple. No jugular venous distention no thyromegaly. Chest clear to auscultation without increased work of breathing. Cardiac exam S1 and S2 are regular. Abdominal exam active bowel sounds, soft, nontender, overweight. Extremities no edema. Neurologic exam she is alert without any motor sensory deficits. Gait is normal.        Assessment & Plan:

## 2010-11-19 ENCOUNTER — Ambulatory Visit
Admission: RE | Admit: 2010-11-19 | Discharge: 2010-11-19 | Disposition: A | Discharge status: Home / Self Care | Payer: 59 | Source: Ambulatory Visit | Attending: Internal Medicine | Admitting: Internal Medicine

## 2010-11-19 DIAGNOSIS — M79609 Pain in unspecified limb: Secondary | ICD-10-CM

## 2010-11-22 ENCOUNTER — Other Ambulatory Visit: Payer: Self-pay | Admitting: Internal Medicine

## 2010-11-22 DIAGNOSIS — M47816 Spondylosis without myelopathy or radiculopathy, lumbar region: Secondary | ICD-10-CM

## 2010-12-12 ENCOUNTER — Other Ambulatory Visit: Payer: Self-pay | Admitting: Internal Medicine

## 2010-12-12 MED ORDER — SITAGLIPTIN PHOSPHATE 100 MG PO TABS: 100.0000 mg | ORAL_TABLET | Freq: Every day | ORAL | Status: DC

## 2010-12-12 NOTE — Telephone Encounter (Signed)
Pt needs refill januvia 100mg  call into [facility] [phone]

## 2010-12-13 NOTE — Discharge Summary (Signed)
Anita Mccormick, Anita Mccormick                ACCOUNT NO.:  [id number]   MEDICAL RECORD NO.:  [id number]          PATIENT TYPE:  INP   LOCATION:  3018                         FACILITY:  [facility]   PHYSICIAN:  Willow Ora, MD           DATE OF BIRTH:  [birth year]   DATE OF ADMISSION:  10/18/2007  DATE OF DISCHARGE:  10/19/2007                               DISCHARGE SUMMARY   DISCHARGE DIAGNOSES:  1. Acute left thalamic infarct.  2. Tobacco abuse.  3. Urinary tract infection with positive UA on admit.  4. Hypertension.  5. Lower back pain with bilateral leg pain.   HISTORY OF PRESENT ILLNESS:  Ms. Stanfill is a 65 year old black female  who presented to the emergency room on day of admission, reporting  waking up on 10/16/2007, with right facial numbness, shoulder and right  arm numbness that have persisted at time of admission.  The patient also  noted later onset of subtle right facial weakness.  She denied any  slurred speech or other motor deficits and no mental status changes.  Evaluation in the ER revealed the patient to be alert and oriented  x3  in no acute distress and cooperative with mild right facial droop.  CT  of the head revealed hypodensity lesions at right central white matter  and left pons, and in addition a tiny area of remote with inner  infarction within the left thalamus.  The patient was admitted at that  time for complete stroke workup.   PAST MEDICAL HISTORY:  1. Type 2 diabetes.  2. Hypertension.  3. History of breast cancer.  4. Status post mastectomy in 1980s.  5. Tobacco abuse.   HOSPITALIZATION COURSE:  1. The patient admitted from ER to telemetry unit for complete stroke      workup.  MRI was obtained, which revealed an acute subcentimeter      left thalamic infarct, non hemorrhagic.  In addition, carotid      Dopplers were performed, which revealed no ICA stenosis      bilaterally.  A 2-D echo revealed  55-65% LVEF with mild mitral      calcification.  No  cardiac etiology of embolic event was noted.      The patient continued on full dose aspirin throughout her      hospitalization.  Importance of smoking cessation discussed with      the patient.  The patient's right facial droop had resolved upon      examination on October 19, 2007, with negative workup.  The patient      felt medically stable for discharge home.  2. UTI with positive UA on admit.  The patient started on Cipro during      this hospitalization.  She is to complete a total of 5 days      treatment.  Urine culture positive for greater than 100,000      colonies of E-coli, sensitive to Cipro.  3. Hypertension.  The patient with elevated blood pressure on admit      and throughout her hospitalization.  The patient's Micardis dose      was increased to b.i.d. with close outpatient followup.   MEDICATIONS AT THAT TIME OF DISCHARGE:  1. aspirin 325 mg p.o. daily.  2. Cipro 500 mg p.o. b.i.d. until gone.  3. Micardis 80 mg p.o. b.i.d.  4. Atenolol 100 mg p.o. b.i.d.  5. Glipizide 5 mg p.o. daily.  6. Januvia 100 mg p.o. daily.  7. Lisinopril 20 mg p.o. b.i.d.   PERTINENT LAB WORK AT THAT TIME OF DISCHARGE:  White cell count 7.3,  platelets 188, hemoglobin 13.9, and hematocrit 40.8.  Sodium 139,  potassium 5.2, BUN 17, creatinine 1.1, and A1c 6.6.   DISPOSITION:  The patient felt medically stable for discharge home at  that time.  She is instructed to follow up with her primary care  physician Dr. Birdie Sons as previously scheduled on March 25th.      Cordelia Pen, NP      Willow Ora, MD  Electronically Signed    LE/MEDQ  D:  11/19/2007  T:  11/20/2007  Job:  [id number]

## 2011-01-09 ENCOUNTER — Encounter: Payer: Self-pay | Admitting: Internal Medicine

## 2011-01-09 ENCOUNTER — Ambulatory Visit (INDEPENDENT_AMBULATORY_CARE_PROVIDER_SITE_OTHER): Payer: 59 | Admitting: Internal Medicine

## 2011-01-09 VITALS — BP 160/80 | HR 76 | Temp 98.2°F | Resp 16 | Ht 62.5 in | Wt 174.0 lb

## 2011-01-09 DIAGNOSIS — N390 Urinary tract infection, site not specified: Secondary | ICD-10-CM

## 2011-01-09 DIAGNOSIS — R928 Other abnormal and inconclusive findings on diagnostic imaging of breast: Secondary | ICD-10-CM

## 2011-01-09 DIAGNOSIS — E119 Type 2 diabetes mellitus without complications: Secondary | ICD-10-CM

## 2011-01-09 LAB — POCT URINALYSIS DIPSTICK
Bilirubin, UA: NEGATIVE
Blood, UA: NEGATIVE
Glucose, UA: NEGATIVE
Nitrite, UA: NEGATIVE
Spec Grav, UA: 1.025

## 2011-01-09 NOTE — Assessment & Plan Note (Signed)
[facility] 01/08/11 Axillary adenopathy - right Needs further evaluation

## 2011-01-09 NOTE — Progress Notes (Signed)
  Subjective:    Patient ID: Anita Mccormick, female    DOB: 10/16/1945, 65 y.o.   MRN: [id number]  HPI  Patient comes in for abnormal ultrasound. Patient has a history of breast cancer. She comes in after mammogram and ultrasound of her right breast documenting right axillary adenopathy. Patient has no concerns or complaints. She feels well. She denies any weight loss, change in appetite.  Past Medical History  Diagnosis Date  . Cancer     history of no adjunctive RX( no chemo no RT)  . Hx of colonic polyps   . Diabetes mellitus     Type 2  . Hypertension   . Cerebrovascular accident   . Eczema    Past Surgical History  Procedure Date  . Mastectomy     reports that she has been smoking Cigarettes.  She does not have any smokeless tobacco history on file. She reports that she drinks alcohol. Her drug history not on file. family history includes Cancer in her father and Stroke in her mother. Allergies  Allergen Reactions  . Chlorthalidone     REACTION: unspecified  . Metformin     REACTION: gi side effects  . Penicillins     REACTION: urticaria (hives)  . Sulfamethoxazole     REACTION: questionable     Review of Systems  patient denies chest pain, shortness of breath, orthopnea. Denies lower extremity edema, abdominal pain, change in appetite, change in bowel movements. Patient denies rashes, musculoskeletal complaints. No other specific complaints in a complete review of systems.      Objective:   Physical Exam Well-developed female in no acute distress. Neck is supple without lymphadenopathy or thyromegaly. Breast exam without masses. I am unable to palpate any axillary adenopathy. Abdominal exam bowel sounds, soft. Extremities no edema.       Assessment & Plan:

## 2011-01-09 NOTE — Patient Instructions (Signed)
We will call you about a biopsy of the lymph node seen on ultrasound

## 2011-01-10 ENCOUNTER — Encounter: Payer: Self-pay | Admitting: Internal Medicine

## 2011-01-13 ENCOUNTER — Other Ambulatory Visit: Payer: Self-pay | Admitting: Internal Medicine

## 2011-01-15 ENCOUNTER — Other Ambulatory Visit: Payer: Self-pay | Admitting: Radiology

## 2011-01-20 ENCOUNTER — Encounter: Payer: Self-pay | Admitting: Internal Medicine

## 2011-01-22 ENCOUNTER — Telehealth: Payer: Self-pay | Admitting: Internal Medicine

## 2011-01-22 MED ORDER — TELMISARTAN 80 MG PO TABS: 80.0000 mg | ORAL_TABLET | Freq: Every day | ORAL | Status: DC

## 2011-01-22 NOTE — Telephone Encounter (Signed)
rx sent in 

## 2011-01-22 NOTE — Telephone Encounter (Signed)
Requesting a new rx for Micardis with [facility]ills [facility].

## 2011-02-05 ENCOUNTER — Other Ambulatory Visit: Payer: Self-pay | Admitting: *Deleted

## 2011-02-05 MED ORDER — SITAGLIPTIN PHOSPHATE 100 MG PO TABS: 100.0000 mg | ORAL_TABLET | Freq: Every day | ORAL | Status: DC

## 2011-02-10 ENCOUNTER — Other Ambulatory Visit: Payer: Self-pay | Admitting: *Deleted

## 2011-02-10 DIAGNOSIS — E119 Type 2 diabetes mellitus without complications: Secondary | ICD-10-CM

## 2011-02-10 MED ORDER — GLIPIZIDE ER 5 MG PO TB24: 5.0000 mg | ORAL_TABLET | Freq: Every day | ORAL | Status: DC

## 2011-02-14 ENCOUNTER — Telehealth: Payer: Self-pay | Admitting: *Deleted

## 2011-02-14 ENCOUNTER — Other Ambulatory Visit: Payer: Self-pay | Admitting: *Deleted

## 2011-02-14 MED ORDER — ATENOLOL 100 MG PO TABS: 100.0000 mg | ORAL_TABLET | Freq: Every day | ORAL | Status: DC

## 2011-02-14 NOTE — Telephone Encounter (Signed)
done

## 2011-02-16 ENCOUNTER — Other Ambulatory Visit: Payer: Self-pay | Admitting: Internal Medicine

## 2011-02-17 ENCOUNTER — Encounter: Payer: Self-pay | Admitting: Podiatry

## 2011-03-12 ENCOUNTER — Other Ambulatory Visit (INDEPENDENT_AMBULATORY_CARE_PROVIDER_SITE_OTHER): Payer: 59

## 2011-03-12 DIAGNOSIS — E119 Type 2 diabetes mellitus without complications: Secondary | ICD-10-CM

## 2011-03-12 LAB — HEPATIC FUNCTION PANEL
ALT: 44 U/L — ABNORMAL HIGH (ref 0–35)
AST: 57 U/L — ABNORMAL HIGH (ref 0–37)
Total Bilirubin: 0.8 mg/dL (ref 0.3–1.2)

## 2011-03-12 LAB — BASIC METABOLIC PANEL
BUN: 14 mg/dL (ref 6–23)
Chloride: 110 mEq/L (ref 96–112)
GFR: 78.95 mL/min (ref >60.00)
Glucose, Bld: 152 mg/dL — ABNORMAL HIGH (ref 70–99)
Potassium: 4 mEq/L (ref 3.5–5.1)
Sodium: 146 mEq/L — ABNORMAL HIGH (ref 135–145)

## 2011-03-12 LAB — LIPID PANEL
Cholesterol: 169 mg/dL (ref 0–200)
LDL Cholesterol: 71 mg/dL (ref 0–99)
Total CHOL/HDL Ratio: 2

## 2011-03-12 LAB — HEMOGLOBIN A1C: Hgb A1c MFr Bld: 6 % (ref 4.6–6.5)

## 2011-03-19 ENCOUNTER — Encounter: Payer: Self-pay | Admitting: Internal Medicine

## 2011-03-19 ENCOUNTER — Ambulatory Visit (INDEPENDENT_AMBULATORY_CARE_PROVIDER_SITE_OTHER): Payer: 59 | Admitting: Internal Medicine

## 2011-03-19 DIAGNOSIS — E119 Type 2 diabetes mellitus without complications: Secondary | ICD-10-CM

## 2011-03-19 DIAGNOSIS — I6350 Cerebral infarction due to unspecified occlusion or stenosis of unspecified cerebral artery: Secondary | ICD-10-CM

## 2011-03-19 DIAGNOSIS — Z0000 Encounter for general adult medical examination without abnormal findings: Secondary | ICD-10-CM

## 2011-03-19 DIAGNOSIS — E785 Hyperlipidemia, unspecified: Secondary | ICD-10-CM

## 2011-03-19 DIAGNOSIS — Z2911 Encounter for prophylactic immunotherapy for respiratory syncytial virus (RSV): Secondary | ICD-10-CM

## 2011-03-19 MED ORDER — ATENOLOL 100 MG PO TABS: 100.0000 mg | ORAL_TABLET | Freq: Two times a day (BID) | ORAL | Status: DC

## 2011-03-19 MED ORDER — HYDROXYZINE HCL 25 MG PO TABS: 25.0000 mg | ORAL_TABLET | Freq: Every evening | ORAL | Status: DC | PRN

## 2011-03-19 NOTE — Assessment & Plan Note (Signed)
Lab Results  Component Value Date   HGBA1C 6.0 03/12/2011   Continue same meds DM is controlled

## 2011-03-19 NOTE — Assessment & Plan Note (Signed)
No recurrence. 

## 2011-03-19 NOTE — Assessment & Plan Note (Signed)
Controlled Continue current meds 

## 2011-03-19 NOTE — Progress Notes (Signed)
  Subjective:    Patient ID: Anita Mccormick, female    DOB: 03-05-1946, 65 y.o.   MRN: [id number]  HPI  patient comes in for followup of multiple medical problems including type 2 diabetes, hyperlipidemia, hypertension. The patient does not check blood sugar or blood pressure at home. The patetient does not follow an exercise or diet program. The patient denies any polyuria, polydipsia.  In the past the patient has gone to diabetic treatment center. The patient is tolerating medications  Without difficulty. The patient does admit to medication compliance.   Past Medical History  Diagnosis Date  . Cancer     history of no adjunctive RX( no chemo no RT)  . Hx of colonic polyps   . Diabetes mellitus     Type 2  . Hypertension   . Cerebrovascular accident   . Eczema    Past Surgical History  Procedure Date  . Mastectomy     reports that she has been smoking Cigarettes.  She has been smoking about .3 packs per day. She does not have any smokeless tobacco history on file. She reports that she drinks alcohol. She reports that she does not use illicit drugs. family history includes Cancer in her father and Stroke in her mother. Allergies  Allergen Reactions  . Chlorthalidone     REACTION: unspecified  . Metformin     REACTION: gi side effects  . Penicillins     REACTION: urticaria (hives)  . Sulfamethoxazole     REACTION: questionable   Review of Systems  patient denies chest pain, shortness of breath, orthopnea. Denies lower extremity edema, abdominal pain, change in appetite, change in bowel movements. Patient denies rashes, musculoskeletal complaints. No other specific complaints in a complete review of systems.      Objective:   Physical Exam  Well-developed well-nourished female in no acute distress. HEENT exam atraumatic, normocephalic, extraocular muscles are intact. Neck is supple. No jugular venous distention no thyromegaly. Chest clear to auscultation without increased work of  breathing. Cardiac exam S1 and S2 are regular. Abdominal exam active bowel sounds, soft, nontender. Extremities no edema. Neurologic exam she is alert without any motor sensory deficits. Gait is normal.        Assessment & Plan:

## 2011-04-18 ENCOUNTER — Telehealth: Payer: Self-pay | Admitting: *Deleted

## 2011-04-18 NOTE — Telephone Encounter (Signed)
Pt is asking RX for vaginal yeast infection....Marland Kitchenredness and itching.  No discharge.

## 2011-04-18 NOTE — Telephone Encounter (Signed)
Notified pt. 

## 2011-04-18 NOTE — Telephone Encounter (Signed)
Can use OTC monistat

## 2011-04-21 LAB — CBC
HCT: 40.8
Hemoglobin: 13.9
MCHC: 34.2
MCV: 104.4 — ABNORMAL HIGH
Platelets: 188
RBC: 3.91
RDW: 14.1
WBC: 7.3

## 2011-04-21 LAB — DIFFERENTIAL
Basophils Absolute: 0
Basophils Relative: 1
Eosinophils Absolute: 0.7
Eosinophils Relative: 10 — ABNORMAL HIGH
Lymphocytes Relative: 29
Lymphs Abs: 2.1
Monocytes Absolute: 0.6
Monocytes Relative: 9
Neutro Abs: 3.7
Neutrophils Relative %: 52

## 2011-04-21 LAB — URINE CULTURE: Colony Count: >=100000

## 2011-04-21 LAB — URINALYSIS, ROUTINE W REFLEX MICROSCOPIC
Bilirubin Urine: NEGATIVE
Glucose, UA: NEGATIVE
Hgb urine dipstick: NEGATIVE
Ketones, ur: NEGATIVE
Nitrite: POSITIVE — AB
Protein, ur: NEGATIVE
Specific Gravity, Urine: 1.017
Urobilinogen, UA: 0.2
pH: 6

## 2011-04-21 LAB — URINE MICROSCOPIC-ADD ON

## 2011-04-21 LAB — POCT CARDIAC MARKERS: Operator id: 151321

## 2011-04-21 LAB — POCT I-STAT, CHEM 8
BUN: 17
Calcium, Ion: 1.22
Glucose, Bld: 140 — ABNORMAL HIGH
HCT: 43
TCO2: 30

## 2011-06-05 ENCOUNTER — Other Ambulatory Visit: Payer: Self-pay | Admitting: *Deleted

## 2011-06-05 MED ORDER — QUINAPRIL HCL 20 MG PO TABS: 20.0000 mg | ORAL_TABLET | Freq: Every day | ORAL | Status: DC

## 2011-07-14 ENCOUNTER — Other Ambulatory Visit: Payer: Self-pay | Admitting: *Deleted

## 2011-07-14 MED ORDER — QUINAPRIL HCL 20 MG PO TABS: 20.0000 mg | ORAL_TABLET | Freq: Two times a day (BID) | ORAL | Status: DC

## 2011-07-20 ENCOUNTER — Other Ambulatory Visit: Payer: Self-pay | Admitting: Internal Medicine

## 2011-07-21 ENCOUNTER — Encounter: Payer: Self-pay | Admitting: Internal Medicine

## 2011-08-06 ENCOUNTER — Other Ambulatory Visit: Payer: Self-pay | Admitting: *Deleted

## 2011-08-06 MED ORDER — SITAGLIPTIN PHOSPHATE 100 MG PO TABS: 100.0000 mg | ORAL_TABLET | Freq: Every day | ORAL | Status: DC

## 2011-09-15 ENCOUNTER — Other Ambulatory Visit (INDEPENDENT_AMBULATORY_CARE_PROVIDER_SITE_OTHER): Payer: 59

## 2011-09-15 DIAGNOSIS — E119 Type 2 diabetes mellitus without complications: Secondary | ICD-10-CM

## 2011-09-15 LAB — HEPATIC FUNCTION PANEL
AST: 69 U/L — ABNORMAL HIGH (ref 0–37)
Albumin: 3.9 g/dL (ref 3.5–5.2)
Total Protein: 7.5 g/dL (ref 6.0–8.3)

## 2011-09-15 LAB — BASIC METABOLIC PANEL
CO2: 25 mEq/L (ref 19–32)
Glucose, Bld: 88 mg/dL (ref 70–99)
Potassium: 4.2 mEq/L (ref 3.5–5.1)
Sodium: 143 mEq/L (ref 135–145)

## 2011-09-15 LAB — LIPID PANEL
HDL: 86.9 mg/dL (ref >39.00)
Total CHOL/HDL Ratio: 2
Triglycerides: 71 mg/dL (ref 0.0–149.0)
VLDL: 14.2 mg/dL (ref 0.0–40.0)

## 2011-09-15 LAB — HEMOGLOBIN A1C: Hgb A1c MFr Bld: 6.5 % (ref 4.6–6.5)

## 2011-09-22 ENCOUNTER — Ambulatory Visit (INDEPENDENT_AMBULATORY_CARE_PROVIDER_SITE_OTHER): Payer: 59 | Admitting: Internal Medicine

## 2011-09-22 DIAGNOSIS — I10 Essential (primary) hypertension: Secondary | ICD-10-CM

## 2011-09-22 DIAGNOSIS — R7401 Elevation of levels of liver transaminase levels: Secondary | ICD-10-CM

## 2011-09-22 DIAGNOSIS — E119 Type 2 diabetes mellitus without complications: Secondary | ICD-10-CM

## 2011-09-22 DIAGNOSIS — Z23 Encounter for immunization: Secondary | ICD-10-CM

## 2011-09-22 MED ORDER — SITAGLIPTIN PHOSPHATE 100 MG PO TABS: 50.0000 mg | ORAL_TABLET | Freq: Every day | ORAL | Status: DC

## 2011-09-22 MED ORDER — LOSARTAN POTASSIUM 100 MG PO TABS: 100.0000 mg | ORAL_TABLET | Freq: Every day | ORAL | Status: DC

## 2011-09-22 NOTE — Assessment & Plan Note (Signed)
Chronic problem Likely weight related Ok to continue statin

## 2011-09-22 NOTE — Assessment & Plan Note (Signed)
BP Readings from Last 3 Encounters:  09/22/11 164/86  03/19/11 140/80  01/09/11 160/80   i've asked her to monitor bp at home Will change meds based on expense

## 2011-09-22 NOTE — Assessment & Plan Note (Signed)
Reasonable control Will decrease januvia in hopes of helping with cost

## 2011-09-22 NOTE — Progress Notes (Signed)
Patient ID: Anita Mccormick, female   DOB: July 31, 1945, 66 y.o.   MRN: [id number]  patient comes in for followup of multiple medical problems including type 2 diabetes, hyperlipidemia, hypertension. The patient does not check blood sugar or blood pressure at home. The patetient does not follow an exercise or diet program. The patient denies any polyuria, polydipsia.  In the past the patient has gone to diabetic treatment center. The patient is tolerating medications  Without difficulty. The patient does admit to medication compliance.  She has questions about generic meds (would like to switch micardis and [place])  Past Medical History  Diagnosis Date  . Cancer     history of no adjunctive RX( no chemo no RT)  . Hx of colonic polyps   . Diabetes mellitus     Type 2  . Hypertension   . Cerebrovascular accident   . Eczema     History   Social History  . Marital Status: Married    Spouse Name: N/A    Number of Children: N/A  . Years of Education: N/A   Occupational History  . Not on file.   Social History Main Topics  . Smoking status: Current Everyday Smoker -- 0.3 packs/day    Types: Cigarettes  . Smokeless tobacco: Not on file   Comment: 3 cigs a day  . Alcohol Use: Yes  . Drug Use: No  . Sexually Active: Not on file   Other Topics Concern  . Not on file   Social History Narrative  . No narrative on file    Past Surgical History  Procedure Date  . Mastectomy     Family History  Problem Relation Age of Onset  . Stroke Mother   . Cancer Father     prostate    Allergies  Allergen Reactions  . Chlorthalidone     REACTION: unspecified  . Metformin     REACTION: gi side effects  . Penicillins     REACTION: urticaria (hives)  . Sulfamethoxazole     REACTION: questionable    Current Outpatient Prescriptions on File Prior to Visit  Medication Sig Dispense Refill  . aspirin 325 MG tablet Take 325 mg by mouth daily.        [name] atenolol (TENORMIN) 100 MG tablet  Take 1 tablet (100 mg total) by mouth 2 (two) times daily.  180 tablet  3  . Calcium Carbonate-Vitamin D (CALCIUM PLUS VITAMIN D) 600-100 MG-UNIT CAPS Take by mouth 2 (two) times daily.        . diclofenac (VOLTAREN) 75 MG EC tablet Take 75 mg by mouth 2 (two) times daily.        [name] glipiZIDE (GLUCOTROL XL) 5 MG 24 hr tablet Take 1 tablet (5 mg total) by mouth daily.  60 tablet  5  . glucose blood test strip 1 each by Other route 2 (two) times daily. Use as instructed       . hydrOXYzine (ATARAX) 25 MG tablet Take 1 tablet (25 mg total) by mouth at bedtime as needed. For itching   30 tablet  1  . MICARDIS 80 MG tablet TAKE 1 TABLET BY MOUTH EVERY DAY  30 tablet  5  . quinapril (ACCUPRIL) 20 MG tablet Take 1 tablet (20 mg total) by mouth 2 (two) times daily.  60 tablet  4  . sitaGLIPtin (JANUVIA) 100 MG tablet Take 1 tablet (100 mg total) by mouth daily.  30 tablet  5  patient denies chest pain, shortness of breath, orthopnea. Denies lower extremity edema, abdominal pain, change in appetite, change in bowel movements. Patient denies rashes, musculoskeletal complaints. No other specific complaints in a complete review of systems.   BP 164/86  Pulse 84  Temp(Src) 97.8 F (36.6 C) (Oral)  Wt 170 lb (77.111 kg)  Well-developed well-nourished female in no acute distress. HEENT exam atraumatic, normocephalic, extraocular muscles are intact. Neck is supple. No jugular venous distention no thyromegaly. Chest clear to auscultation without increased work of breathing. Cardiac exam S1 and S2 are regular. Abdominal exam active bowel sounds, soft, nontender, overweight. Extremities no edema.

## 2011-12-01 ENCOUNTER — Other Ambulatory Visit: Payer: Self-pay | Admitting: *Deleted

## 2011-12-01 MED ORDER — QUINAPRIL HCL 20 MG PO TABS: 20.0000 mg | ORAL_TABLET | Freq: Two times a day (BID) | ORAL | Status: DC

## 2012-01-28 ENCOUNTER — Other Ambulatory Visit: Payer: Self-pay | Admitting: Internal Medicine

## 2012-02-26 ENCOUNTER — Encounter: Payer: Self-pay | Admitting: Internal Medicine

## 2012-03-07 ENCOUNTER — Other Ambulatory Visit: Payer: Self-pay | Admitting: Internal Medicine

## 2012-03-15 ENCOUNTER — Other Ambulatory Visit (INDEPENDENT_AMBULATORY_CARE_PROVIDER_SITE_OTHER): Payer: 59

## 2012-03-15 ENCOUNTER — Other Ambulatory Visit: Payer: 59

## 2012-03-15 DIAGNOSIS — E119 Type 2 diabetes mellitus without complications: Secondary | ICD-10-CM

## 2012-03-15 LAB — HEPATIC FUNCTION PANEL
ALT: 67 U/L — ABNORMAL HIGH (ref 0–35)
Albumin: 4 g/dL (ref 3.5–5.2)
Alkaline Phosphatase: 92 U/L (ref 39–117)
Bilirubin, Direct: 0.2 mg/dL (ref 0.0–0.3)
Total Protein: 7.4 g/dL (ref 6.0–8.3)

## 2012-03-15 LAB — LIPID PANEL
Cholesterol: 172 mg/dL (ref 0–200)
LDL Cholesterol: 77 mg/dL (ref 0–99)
Triglycerides: 94 mg/dL (ref 0.0–149.0)

## 2012-03-15 LAB — BASIC METABOLIC PANEL
CO2: 28 mEq/L (ref 19–32)
Calcium: 9.9 mg/dL (ref 8.4–10.5)
Chloride: 100 mEq/L (ref 96–112)
Sodium: 139 mEq/L (ref 135–145)

## 2012-03-15 LAB — HEMOGLOBIN A1C: Hgb A1c MFr Bld: 6.7 % — ABNORMAL HIGH (ref 4.6–6.5)

## 2012-03-22 ENCOUNTER — Ambulatory Visit (INDEPENDENT_AMBULATORY_CARE_PROVIDER_SITE_OTHER): Payer: 59 | Admitting: Internal Medicine

## 2012-03-22 ENCOUNTER — Encounter: Payer: Self-pay | Admitting: Internal Medicine

## 2012-03-22 ENCOUNTER — Ambulatory Visit: Payer: 59 | Admitting: Internal Medicine

## 2012-03-22 VITALS — BP 138/86 | HR 76 | Temp 98.5°F | Wt 168.0 lb

## 2012-03-22 DIAGNOSIS — I10 Essential (primary) hypertension: Secondary | ICD-10-CM

## 2012-03-22 DIAGNOSIS — I6350 Cerebral infarction due to unspecified occlusion or stenosis of unspecified cerebral artery: Secondary | ICD-10-CM

## 2012-03-22 DIAGNOSIS — E119 Type 2 diabetes mellitus without complications: Secondary | ICD-10-CM

## 2012-03-22 NOTE — Assessment & Plan Note (Signed)
fiar control Note ACE-I and ARB

## 2012-03-22 NOTE — Assessment & Plan Note (Signed)
No recurrence-  Continue same meds (risk factor modification)

## 2012-03-22 NOTE — Assessment & Plan Note (Signed)
Reasonable control Continue same meds Discussed need for weight loss and daily exercise

## 2012-03-22 NOTE — Progress Notes (Signed)
patient comes in for followup of multiple medical problems including type 2 diabetes, hyperlipidemia, hypertension. The patient does not check blood sugar or blood pressure at home. The patetient does not follow an exercise or diet program. The patient denies any polyuria, polydipsia.  In the past the patient has gone to diabetic treatment center. The patient is tolerating medications  Without difficulty. The patient does admit to medication compliance.  Past Medical History  Diagnosis Date  . Cancer     history of no adjunctive RX( no chemo no RT)  . Hx of colonic polyps   . Diabetes mellitus     Type 2  . Hypertension   . Cerebrovascular accident   . Eczema     History   Social History  . Marital Status: Married    Spouse Name: N/A    Number of Children: N/A  . Years of Education: N/A   Occupational History  . Not on file.   Social History Main Topics  . Smoking status: Current Everyday Smoker -- 0.3 packs/day    Types: Cigarettes  . Smokeless tobacco: Not on file   Comment: 3 cigs a day  . Alcohol Use: Yes  . Drug Use: No  . Sexually Active: Not on file   Other Topics Concern  . Not on file   Social History Narrative  . No narrative on file    Past Surgical History  Procedure Date  . Mastectomy     Family History  Problem Relation Age of Onset  . Stroke Mother   . Cancer Father     prostate    Allergies  Allergen Reactions  . Chlorthalidone     REACTION: unspecified  . Metformin     REACTION: gi side effects  . Penicillins     REACTION: urticaria (hives)  . Sulfamethoxazole     REACTION: questionable    Current Outpatient Prescriptions on File Prior to Visit  Medication Sig Dispense Refill  . aspirin 325 MG tablet Take 325 mg by mouth daily.        [name] atenolol (TENORMIN) 100 MG tablet TAKE 1 TABLET BY MOUTH TWICE A DAY  180 tablet  3  . Calcium Carbonate-Vitamin D (CALCIUM PLUS VITAMIN D) 600-100 MG-UNIT CAPS Take by mouth 2 (two) times daily.         [name] glipiZIDE (GLUCOTROL XL) 5 MG 24 hr tablet TAKE 1 TABLET EVERY DAY  60 tablet  5  . glucose blood test strip 1 each by Other route 2 (two) times daily. Use as instructed       . losartan (COZAAR) 100 MG tablet Take 1 tablet (100 mg total) by mouth daily.  90 tablet  3  . quinapril (ACCUPRIL) 20 MG tablet Take 1 tablet (20 mg total) by mouth 2 (two) times daily.  60 tablet  5  . sitaGLIPtin (JANUVIA) 100 MG tablet Take 0.5 tablets (50 mg total) by mouth daily.  30 tablet  5     patient denies chest pain, shortness of breath, orthopnea. Denies lower extremity edema, abdominal pain, change in appetite, change in bowel movements. Patient denies rashes, musculoskeletal complaints. No other specific complaints in a complete review of systems.   BP 138/86  Pulse 76  Temp 98.5 F (36.9 C) (Oral)  Wt 168 lb (76.204 kg)  Well-developed well-nourished female in no acute distress. HEENT exam atraumatic, normocephalic, extraocular muscles are intact. Neck is supple. No jugular venous distention no thyromegaly. Chest clear to auscultation without  increased work of breathing. Cardiac exam S1 and S2 are regular. Abdominal exam active bowel sounds, soft, nontender. Extremities no edema. Neurologic exam she is alert without any motor sensory deficits. Gait is normal.

## 2012-04-30 ENCOUNTER — Ambulatory Visit (INDEPENDENT_AMBULATORY_CARE_PROVIDER_SITE_OTHER): Payer: 59 | Admitting: *Deleted

## 2012-04-30 DIAGNOSIS — Z23 Encounter for immunization: Secondary | ICD-10-CM

## 2012-05-04 ENCOUNTER — Other Ambulatory Visit: Payer: Self-pay | Admitting: Internal Medicine

## 2012-05-11 ENCOUNTER — Ambulatory Visit: Payer: 59

## 2012-06-04 ENCOUNTER — Other Ambulatory Visit: Payer: Self-pay | Admitting: Internal Medicine

## 2012-06-07 ENCOUNTER — Other Ambulatory Visit: Payer: Self-pay | Admitting: Internal Medicine

## 2012-09-22 ENCOUNTER — Ambulatory Visit: Payer: 59 | Admitting: Internal Medicine

## 2012-09-23 ENCOUNTER — Ambulatory Visit (INDEPENDENT_AMBULATORY_CARE_PROVIDER_SITE_OTHER): Payer: Medicare Other | Admitting: Internal Medicine

## 2012-09-23 ENCOUNTER — Encounter: Payer: Self-pay | Admitting: Internal Medicine

## 2012-09-23 VITALS — BP 140/80 | Temp 98.2°F | Wt 166.0 lb

## 2012-09-23 DIAGNOSIS — I10 Essential (primary) hypertension: Secondary | ICD-10-CM

## 2012-09-23 DIAGNOSIS — E119 Type 2 diabetes mellitus without complications: Secondary | ICD-10-CM

## 2012-09-23 DIAGNOSIS — E785 Hyperlipidemia, unspecified: Secondary | ICD-10-CM

## 2012-09-23 LAB — BASIC METABOLIC PANEL
BUN: 11 mg/dL (ref 6–23)
Calcium: 10.3 mg/dL (ref 8.4–10.5)
Chloride: 103 mEq/L (ref 96–112)
Creatinine, Ser: 1 mg/dL (ref 0.4–1.2)
GFR: 71.36 mL/min (ref >60.00)

## 2012-09-23 LAB — HEPATIC FUNCTION PANEL
Bilirubin, Direct: 0.2 mg/dL (ref 0.0–0.3)
Total Bilirubin: 1 mg/dL (ref 0.3–1.2)

## 2012-09-23 LAB — LIPID PANEL
Cholesterol: 186 mg/dL (ref 0–200)
HDL: 74.9 mg/dL (ref >39.00)
LDL Cholesterol: 88 mg/dL (ref 0–99)
Total CHOL/HDL Ratio: 2
Triglycerides: 116 mg/dL (ref 0.0–149.0)
VLDL: 23.2 mg/dL (ref 0.0–40.0)

## 2012-09-26 NOTE — Assessment & Plan Note (Signed)
Will check labs today She needs to lose weight and exercise everyday  Needs to stop somking

## 2012-09-26 NOTE — Assessment & Plan Note (Signed)
BP Readings from Last 3 Encounters:  09/23/12 140/80  03/22/12 138/86  09/22/11 164/86   Fair control for now i've asked her to monitor home bps.  Goal bp< 130/80

## 2012-09-26 NOTE — Progress Notes (Signed)
Patient ID: Anita Mccormick, female   DOB: 1946-04-27, 67 y.o.   MRN: [id number]  patient comes in for followup of multiple medical problems including type 2 diabetes, hyperlipidemia, hypertension. The patient does not check blood sugar or blood pressure at home. The patetient does not follow an exercise or diet program. The patient denies any polyuria, polydipsia.  In the past the patient has gone to diabetic treatment center. The patient is tolerating medications  Without difficulty. The patient does admit to medication compliance.   Past Medical History  Diagnosis Date  . Cancer     history of no adjunctive RX( no chemo no RT)  . Hx of colonic polyps   . Diabetes mellitus     Type 2  . Hypertension   . Cerebrovascular accident   . Eczema   . COLONIC POLYPS, HX OF 12/10/2006    Qualifier: Diagnosis of  By: Cato Mulligan MD, Bruce      History   Social History  . Marital Status: Married    Spouse Name: N/A    Number of Children: N/A  . Years of Education: N/A   Occupational History  . Not on file.   Social History Main Topics  . Smoking status: Current Every Day Smoker -- 0.30 packs/day    Types: Cigarettes  . Smokeless tobacco: Not on file     Comment: 3 cigs a day  . Alcohol Use: Yes  . Drug Use: No  . Sexually Active: Not on file   Other Topics Concern  . Not on file   Social History Narrative  . No narrative on file    Past Surgical History  Procedure Laterality Date  . Mastectomy      Family History  Problem Relation Age of Onset  . Stroke Mother   . Cancer Father     prostate    Allergies  Allergen Reactions  . Chlorthalidone     REACTION: unspecified  . Metformin     REACTION: gi side effects  . Penicillins     REACTION: urticaria (hives)  . Sulfamethoxazole     REACTION: questionable    Current Outpatient Prescriptions on File Prior to Visit  Medication Sig Dispense Refill  . aspirin 325 MG tablet Take 325 mg by mouth daily.        [name] atenolol  (TENORMIN) 100 MG tablet TAKE 1 TABLET BY MOUTH TWICE A DAY  180 tablet  3  . Calcium Carbonate-Vitamin D (CALCIUM PLUS VITAMIN D) 600-100 MG-UNIT CAPS Take by mouth 2 (two) times daily.        [name] glipiZIDE (GLUCOTROL XL) 5 MG 24 hr tablet TAKE 1 TABLET EVERY DAY  60 tablet  5  . glucose blood test strip 1 each by Other route 2 (two) times daily. Use as instructed       . hydrOXYzine (ATARAX/VISTARIL) 10 MG tablet Take 1 tablet by mouth daily.      [name] JANUVIA 100 MG tablet TAKE 1 TABLET (100 MG TOTAL) BY MOUTH DAILY.  30 tablet  5  . quinapril (ACCUPRIL) 20 MG tablet TAKE 1 TABLET (20 MG TOTAL) BY MOUTH 2 (TWO) TIMES DAILY.  60 tablet  5  . quinapril (ACCUPRIL) 20 MG tablet TAKE 1 TABLET (20 MG TOTAL) BY MOUTH 2 (TWO) TIMES DAILY.  60 tablet  5  . sitaGLIPtin (JANUVIA) 100 MG tablet Take 0.5 tablets (50 mg total) by mouth daily.  30 tablet  5  . losartan (COZAAR) 100 MG tablet  Take 1 tablet (100 mg total) by mouth daily.  90 tablet  3  . triamcinolone cream (KENALOG) 0.1 % as needed.       No current facility-administered medications on file prior to visit.     patient denies chest pain, shortness of breath, orthopnea. Denies lower extremity edema, abdominal pain, change in appetite, change in bowel movements. Patient denies rashes, musculoskeletal complaints. No other specific complaints in a complete review of systems.   BP 140/80  Temp(Src) 98.2 F (36.8 C) (Oral)  Wt 166 lb (75.297 kg)  BMI 30.35 kg/m2  Well-developed well-nourished female in no acute distress. HEENT exam atraumatic, normocephalic, extraocular muscles are intact. Neck is supple. No jugular venous distention no thyromegaly. Chest clear to auscultation without increased work of breathing. Cardiac exam S1 and S2 are regular. Abdominal exam active bowel sounds, soft, nontender. Extremities no edema. Neurologic exam she is alert without any motor sensory deficits. Gait is normal.

## 2012-09-26 NOTE — Assessment & Plan Note (Signed)
Check [facility] today

## 2012-09-27 ENCOUNTER — Other Ambulatory Visit: Payer: Self-pay | Admitting: Internal Medicine

## 2012-09-27 DIAGNOSIS — R7989 Other specified abnormal findings of blood chemistry: Secondary | ICD-10-CM

## 2012-09-28 ENCOUNTER — Other Ambulatory Visit (INDEPENDENT_AMBULATORY_CARE_PROVIDER_SITE_OTHER): Payer: Medicare Other

## 2012-09-28 DIAGNOSIS — R7401 Elevation of levels of liver transaminase levels: Secondary | ICD-10-CM

## 2012-09-29 LAB — HEPATITIS C ANTIBODY: HCV Ab: NEGATIVE

## 2012-09-29 LAB — HEPATITIS B CORE ANTIBODY, TOTAL: Hep B Core Total Ab: NEGATIVE

## 2012-09-30 ENCOUNTER — Telehealth: Payer: Self-pay | Admitting: Internal Medicine

## 2012-09-30 NOTE — Telephone Encounter (Signed)
Caller: Anita Mccormick/Patient; Phone: [phone]; Reason for Call: Patient had to get her liver enzymes redrawn due to abnormal results.  She was also scheduled for an ultrasound for tomorrow 10/01/12 of her liver.  The repeat liver results were normal, and patient is now wanting to know if she still needs to have the ultrasound?  Please call her back and let her know.  It is scheduled for 9: 00am tomorrow morning.  You may leave a message with her husband if she is not at home.

## 2012-10-01 ENCOUNTER — Other Ambulatory Visit: Payer: Medicare Other

## 2012-10-02 NOTE — Telephone Encounter (Signed)
Pt did not go to ultrasound for the liver cause of the weather.  Pt wants to know if she should go Monday?

## 2012-10-03 NOTE — Telephone Encounter (Signed)
Ok to reschedule

## 2012-10-04 NOTE — Telephone Encounter (Signed)
appt was scheduled 

## 2012-10-06 ENCOUNTER — Ambulatory Visit
Admission: RE | Admit: 2012-10-06 | Discharge: 2012-10-06 | Disposition: A | Discharge status: Home / Self Care | Payer: Medicare Other | Source: Ambulatory Visit | Attending: Internal Medicine | Admitting: Internal Medicine

## 2012-10-06 DIAGNOSIS — R7989 Other specified abnormal findings of blood chemistry: Secondary | ICD-10-CM

## 2012-11-10 ENCOUNTER — Other Ambulatory Visit: Payer: Self-pay | Admitting: *Deleted

## 2012-11-10 MED ORDER — LOSARTAN POTASSIUM 100 MG PO TABS: 100.0000 mg | ORAL_TABLET | Freq: Every day | ORAL | Status: DC

## 2012-11-15 ENCOUNTER — Encounter: Payer: Self-pay | Admitting: *Deleted

## 2012-12-02 ENCOUNTER — Other Ambulatory Visit: Payer: Self-pay | Admitting: Internal Medicine

## 2013-01-29 ENCOUNTER — Other Ambulatory Visit: Payer: Self-pay | Admitting: Internal Medicine

## 2013-01-31 ENCOUNTER — Other Ambulatory Visit: Payer: Self-pay | Admitting: Internal Medicine

## 2013-02-23 ENCOUNTER — Other Ambulatory Visit: Payer: Self-pay | Admitting: Internal Medicine

## 2013-03-16 ENCOUNTER — Other Ambulatory Visit (INDEPENDENT_AMBULATORY_CARE_PROVIDER_SITE_OTHER): Payer: Medicare Other

## 2013-03-16 DIAGNOSIS — I10 Essential (primary) hypertension: Secondary | ICD-10-CM

## 2013-03-16 DIAGNOSIS — IMO0001 Reserved for inherently not codable concepts without codable children: Secondary | ICD-10-CM

## 2013-03-16 DIAGNOSIS — E785 Hyperlipidemia, unspecified: Secondary | ICD-10-CM

## 2013-03-16 LAB — HEPATIC FUNCTION PANEL
ALT: 65 U/L — ABNORMAL HIGH (ref 0–35)
AST: 142 U/L — ABNORMAL HIGH (ref 0–37)
Albumin: 3.7 g/dL (ref 3.5–5.2)
Alkaline Phosphatase: 73 U/L (ref 39–117)

## 2013-03-16 LAB — CBC WITH DIFFERENTIAL/PLATELET
Basophils Relative: 0.8 % (ref 0.0–3.0)
Eosinophils Relative: 7.4 % — ABNORMAL HIGH (ref 0.0–5.0)
HCT: 41.1 % (ref 36.0–46.0)
Hemoglobin: 14.1 g/dL (ref 12.0–15.0)
Lymphs Abs: 0.9 10*3/uL (ref 0.7–4.0)
Monocytes Relative: 9.2 % (ref 3.0–12.0)
Neutro Abs: 2.7 10*3/uL (ref 1.4–7.7)
RBC: 3.8 Mil/uL — ABNORMAL LOW (ref 3.87–5.11)
WBC: 4.4 10*3/uL — ABNORMAL LOW (ref 4.5–10.5)

## 2013-03-16 LAB — BASIC METABOLIC PANEL
GFR: 78.45 mL/min (ref >60.00)
Glucose, Bld: 147 mg/dL — ABNORMAL HIGH (ref 70–99)
Potassium: 3.8 mEq/L (ref 3.5–5.1)
Sodium: 140 mEq/L (ref 135–145)

## 2013-03-16 LAB — LIPID PANEL: Total CHOL/HDL Ratio: 2

## 2013-03-16 LAB — HEMOGLOBIN A1C: Hgb A1c MFr Bld: 7 % — ABNORMAL HIGH (ref 4.6–6.5)

## 2013-03-16 LAB — MICROALBUMIN / CREATININE URINE RATIO: Microalb, Ur: 0.9 mg/dL (ref 0.0–1.9)

## 2013-03-22 ENCOUNTER — Encounter: Payer: Self-pay | Admitting: Internal Medicine

## 2013-03-23 ENCOUNTER — Encounter: Payer: Self-pay | Admitting: Internal Medicine

## 2013-03-23 ENCOUNTER — Ambulatory Visit (INDEPENDENT_AMBULATORY_CARE_PROVIDER_SITE_OTHER): Payer: Medicare Other | Admitting: Internal Medicine

## 2013-03-23 VITALS — BP 154/86 | HR 68 | Temp 98.7°F | Wt 163.0 lb

## 2013-03-23 DIAGNOSIS — E785 Hyperlipidemia, unspecified: Secondary | ICD-10-CM

## 2013-03-23 DIAGNOSIS — I10 Essential (primary) hypertension: Secondary | ICD-10-CM

## 2013-03-23 DIAGNOSIS — I6350 Cerebral infarction due to unspecified occlusion or stenosis of unspecified cerebral artery: Secondary | ICD-10-CM

## 2013-03-23 DIAGNOSIS — E1159 Type 2 diabetes mellitus with other circulatory complications: Secondary | ICD-10-CM

## 2013-03-23 DIAGNOSIS — Z23 Encounter for immunization: Secondary | ICD-10-CM

## 2013-03-23 DIAGNOSIS — E119 Type 2 diabetes mellitus without complications: Secondary | ICD-10-CM

## 2013-03-23 MED ORDER — AMLODIPINE BESYLATE 5 MG PO TABS: 5.0000 mg | ORAL_TABLET | Freq: Every day | ORAL | Status: DC

## 2013-03-23 NOTE — Progress Notes (Signed)
patient comes in for followup of multiple medical problems including type 2 diabetes, hyperlipidemia, hypertension. The patient does not check blood sugar or blood pressure at home. The patetient does not follow an exercise or diet program. The patient denies any polyuria, polydipsia.  In the past the patient has gone to diabetic treatment center. The patient is tolerating medications  Without difficulty. The patient does admit to medication compliance.  She complains of cough and tickle in throat  Past Medical History  Diagnosis Date  . Cancer     history of no adjunctive RX( no chemo no RT)  . Hx of colonic polyps   . Diabetes mellitus     Type 2  . Hypertension   . Cerebrovascular accident   . Eczema   . COLONIC POLYPS, HX OF 12/10/2006    Qualifier: Diagnosis of  By: Cato Mulligan MD, Mamoru Takeshita      History   Social History  . Marital Status: Married    Spouse Name: N/A    Number of Children: N/A  . Years of Education: N/A   Occupational History  . Not on file.   Social History Main Topics  . Smoking status: Current Every Day Smoker -- 0.30 packs/day    Types: Cigarettes  . Smokeless tobacco: Not on file     Comment: 3 cigs a day  . Alcohol Use: Yes  . Drug Use: No  . Sexual Activity: Not on file   Other Topics Concern  . Not on file   Social History Narrative  . No narrative on file    Past Surgical History  Procedure Laterality Date  . Mastectomy      Family History  Problem Relation Age of Onset  . Stroke Mother   . Cancer Father     prostate    Allergies  Allergen Reactions  . Chlorthalidone     REACTION: unspecified  . Metformin     REACTION: gi side effects  . Penicillins     REACTION: urticaria (hives)  . Sulfamethoxazole     REACTION: questionable    Current Outpatient Prescriptions on File Prior to Visit  Medication Sig Dispense Refill  . aspirin 325 MG tablet Take 325 mg by mouth daily.        [name] atenolol (TENORMIN) 100 MG tablet TAKE 1 TABLET  BY MOUTH TWICE A DAY  180 tablet  3  . Calcium Carbonate-Vitamin D (CALCIUM PLUS VITAMIN D) 600-100 MG-UNIT CAPS Take by mouth 2 (two) times daily.        [name] glipiZIDE (GLUCOTROL XL) 5 MG 24 hr tablet TAKE 1 TABLET EVERY DAY  60 tablet  5  . glucose blood test strip 1 each by Other route 2 (two) times daily. Use as instructed       . hydrOXYzine (ATARAX/VISTARIL) 10 MG tablet Take 1 tablet by mouth daily.      [name] JANUVIA 100 MG tablet TAKE 1 TABLET (100 MG TOTAL) BY MOUTH DAILY.  30 tablet  5  . losartan (COZAAR) 100 MG tablet Take 1 tablet (100 mg total) by mouth daily.  90 tablet  3  . quinapril (ACCUPRIL) 20 MG tablet TAKE 1 TABLET (20 MG TOTAL) BY MOUTH 2 (TWO) TIMES DAILY.  60 tablet  5  . triamcinolone cream (KENALOG) 0.1 % as needed.       No current facility-administered medications on file prior to visit.     patient denies chest pain, shortness of breath, orthopnea. Denies lower extremity edema,  abdominal pain, change in appetite, change in bowel movements. Patient denies rashes, musculoskeletal complaints. No other specific complaints in a complete review of systems.   BP 154/86  Pulse 68  Temp(Src) 98.7 F (37.1 C) (Oral)  Wt 163 lb (73.936 kg)  BMI 29.81 kg/m2  Well-developed well-nourished female in no acute distress. HEENT exam atraumatic, normocephalic, extraocular muscles are intact. Neck is supple. No jugular venous distention no thyromegaly. Chest clear to auscultation without increased work of breathing. Cardiac exam S1 and S2 are regular. Abdominal exam active bowel sounds, soft, nontender. Extremities no edema. Neurologic exam she is alert without any motor sensory deficits. Gait is normal.

## 2013-03-23 NOTE — Assessment & Plan Note (Signed)
Continue meds- Note A1C

## 2013-03-23 NOTE — Assessment & Plan Note (Signed)
Not controlled and I'm concerned with side effect of ACE-I Also on AB Will d/c quinapril

## 2013-03-23 NOTE — Assessment & Plan Note (Signed)
Lipid Panel     Component Value Date/Time   CHOL 168 03/16/2013 0931   TRIG 121.0 03/16/2013 0931   HDL 72.00 03/16/2013 0931   CHOLHDL 2 03/16/2013 0931   VLDL 24.2 03/16/2013 0931   LDLCALC 72 03/16/2013 0931   Controlled Continue same meds-- note lfts

## 2013-03-23 NOTE — Assessment & Plan Note (Signed)
No recurrence- risk factor modification is key She needs to QUIT all smoking

## 2013-03-23 NOTE — Patient Instructions (Signed)
Monitor your blood pressure at home- if BP is over 140 consistently call for further instructions

## 2013-04-28 ENCOUNTER — Other Ambulatory Visit: Payer: Self-pay | Admitting: Internal Medicine

## 2013-06-29 ENCOUNTER — Ambulatory Visit: Payer: Self-pay | Admitting: Podiatry

## 2013-06-30 ENCOUNTER — Other Ambulatory Visit: Payer: Self-pay | Admitting: *Deleted

## 2013-06-30 MED ORDER — GLUCOSE BLOOD VI STRP: 1.0000 | ORAL_STRIP | Freq: Two times a day (BID) | Status: DC

## 2013-07-06 ENCOUNTER — Ambulatory Visit (INDEPENDENT_AMBULATORY_CARE_PROVIDER_SITE_OTHER): Payer: Medicare Other | Admitting: Podiatry

## 2013-07-06 ENCOUNTER — Encounter: Payer: Self-pay | Admitting: Podiatry

## 2013-07-06 VITALS — BP 124/68 | HR 74 | Resp 16 | Ht 62.0 in | Wt 175.0 lb

## 2013-07-06 DIAGNOSIS — B351 Tinea unguium: Secondary | ICD-10-CM

## 2013-07-06 DIAGNOSIS — M79609 Pain in unspecified limb: Secondary | ICD-10-CM

## 2013-07-06 NOTE — Progress Notes (Signed)
Patient ID: Anita Mccormick, female   DOB: [birth year], 67 y.o.   MRN: [id number]   Subjective: 67 year old black female presents for ongoing debridement of painful mycotic toenails. She is a type II diabetic.  Objective: Orientated x3 patient presents with hypertrophic, discolored, incurvated toenails x10 that are tender to palpation.  Assessment: Neglected symptomatic onychomycoses x10  Plan: All 10 toenails are debrided back without a bleeding. Reappoint at three-month intervals

## 2013-07-28 LAB — HM DIABETES EYE EXAM

## 2013-07-28 LAB — HM MAMMOGRAPHY

## 2013-07-28 LAB — HM DIABETES FOOT EXAM

## 2013-09-27 ENCOUNTER — Telehealth: Payer: Self-pay | Admitting: Internal Medicine

## 2013-09-27 ENCOUNTER — Ambulatory Visit (INDEPENDENT_AMBULATORY_CARE_PROVIDER_SITE_OTHER): Payer: Medicare Other | Admitting: Internal Medicine

## 2013-09-27 ENCOUNTER — Encounter: Payer: Self-pay | Admitting: Internal Medicine

## 2013-09-27 VITALS — BP 114/66 | HR 68 | Temp 98.7°F | Ht 62.0 in | Wt 165.0 lb

## 2013-09-27 DIAGNOSIS — E785 Hyperlipidemia, unspecified: Secondary | ICD-10-CM

## 2013-09-27 DIAGNOSIS — E1159 Type 2 diabetes mellitus with other circulatory complications: Secondary | ICD-10-CM

## 2013-09-27 DIAGNOSIS — I10 Essential (primary) hypertension: Secondary | ICD-10-CM

## 2013-09-27 DIAGNOSIS — E119 Type 2 diabetes mellitus without complications: Secondary | ICD-10-CM

## 2013-09-27 LAB — BASIC METABOLIC PANEL
BUN: 11 mg/dL (ref 6–23)
CO2: 27 meq/L (ref 19–32)
Calcium: 9.5 mg/dL (ref 8.4–10.5)
Chloride: 96 mEq/L (ref 96–112)
Creatinine, Ser: 1.1 mg/dL (ref 0.4–1.2)
GFR: 65.8 mL/min (ref >60.00)
Glucose, Bld: 289 mg/dL — ABNORMAL HIGH (ref 70–99)
POTASSIUM: 4 meq/L (ref 3.5–5.1)
SODIUM: 134 meq/L — AB (ref 135–145)

## 2013-09-27 LAB — HEPATIC FUNCTION PANEL
ALBUMIN: 3.4 g/dL — AB (ref 3.5–5.2)
ALT: 31 U/L (ref 0–35)
AST: 55 U/L — ABNORMAL HIGH (ref 0–37)
Alkaline Phosphatase: 104 U/L (ref 39–117)
Bilirubin, Direct: 0.1 mg/dL (ref 0.0–0.3)
TOTAL PROTEIN: 6.9 g/dL (ref 6.0–8.3)
Total Bilirubin: 0.7 mg/dL (ref 0.3–1.2)

## 2013-09-27 LAB — MICROALBUMIN / CREATININE URINE RATIO
CREATININE, U: 76 mg/dL
Microalb Creat Ratio: 0.1 mg/g (ref 0.0–30.0)
Microalb, Ur: 0.1 mg/dL (ref 0.0–1.9)

## 2013-09-27 LAB — LIPID PANEL
CHOL/HDL RATIO: 2
Cholesterol: 148 mg/dL (ref 0–200)
HDL: 60.8 mg/dL (ref >39.00)
LDL Cholesterol: 59 mg/dL (ref 0–99)
Triglycerides: 142 mg/dL (ref 0.0–149.0)
VLDL: 28.4 mg/dL (ref 0.0–40.0)

## 2013-09-27 LAB — HEMOGLOBIN A1C: Hgb A1c MFr Bld: 8 % — ABNORMAL HIGH (ref 4.6–6.5)

## 2013-09-27 NOTE — Telephone Encounter (Signed)
Relevant patient education mailed to patient.  

## 2013-09-27 NOTE — Progress Notes (Signed)
Pre visit review using our clinic review tool, if applicable. No additional management support is needed unless otherwise documented below in the visit note. 

## 2013-09-28 ENCOUNTER — Ambulatory Visit: Payer: Medicare Other | Admitting: Internal Medicine

## 2013-09-28 NOTE — Assessment & Plan Note (Signed)
Needs laboratory work. We'll check today.

## 2013-09-28 NOTE — Assessment & Plan Note (Signed)
BP Readings from Last 4 Encounters:  09/27/13 114/66  07/06/13 124/68  03/23/13 154/86  09/23/12 140/80   Adequate control. Continue current medications.

## 2013-09-28 NOTE — Progress Notes (Signed)
patient comes in for followup of multiple medical problems including type 2 diabetes, hyperlipidemia, hypertension. The patient does not check blood sugar or blood pressure at home. The patetient does not follow an exercise or diet program. The patient denies any polyuria, polydipsia.  In the past the patient has gone to diabetic treatment center. The patient is tolerating medications  Without difficulty. The patient does admit to medication compliance.   Past Medical History  Diagnosis Date  . Cancer     history of no adjunctive RX( no chemo no RT)  . Hx of colonic polyps   . Diabetes mellitus     Type 2  . Hypertension   . Cerebrovascular accident   . Eczema   . COLONIC POLYPS, HX OF 12/10/2006    Qualifier: Diagnosis of  By: Leanne Chang MD, Bruce      History   Social History  . Marital Status: Married    Spouse Name: N/A    Number of Children: N/A  . Years of Education: N/A   Occupational History  . Not on file.   Social History Main Topics  . Smoking status: Current Every Day Smoker -- 0.25 packs/day    Types: Cigarettes  . Smokeless tobacco: Never Used     Comment: 3 cigs a day  . Alcohol Use: Yes     Comment: a drink every now and again  . Drug Use: No  . Sexual Activity: Not on file   Other Topics Concern  . Not on file   Social History Narrative  . No narrative on file    Past Surgical History  Procedure Laterality Date  . Mastectomy      Family History  Problem Relation Age of Onset  . Stroke Mother   . Cancer Father     prostate    Allergies  Allergen Reactions  . Ace Inhibitors Cough    ????  . Chlorthalidone     REACTION: unspecified  . Metformin     REACTION: gi side effects  . Penicillins     REACTION: urticaria (hives)  . Sulfamethoxazole     REACTION: questionable    Current Outpatient Prescriptions on File Prior to Visit  Medication Sig Dispense Refill  . amLODipine (NORVASC) 5 MG tablet Take 1 tablet (5 mg total) by mouth daily.   90 tablet  3  . aspirin 325 MG tablet Take 325 mg by mouth daily.        [name] atenolol (TENORMIN) 100 MG tablet TAKE 1 TABLET BY MOUTH TWICE A DAY  180 tablet  3  . Calcium Carbonate-Vitamin D (CALCIUM PLUS VITAMIN D) 600-100 MG-UNIT CAPS Take by mouth 2 (two) times daily.        [name] glipiZIDE (GLUCOTROL XL) 5 MG 24 hr tablet TAKE 1 TABLET EVERY DAY  60 tablet  5  . glucose blood (ONE TOUCH ULTRA TEST) test strip 1 each by Other route 2 (two) times daily. Use as instructed  100 each  3  . losartan (COZAAR) 100 MG tablet Take 1 tablet (100 mg total) by mouth daily.  90 tablet  3  . triamcinolone cream (KENALOG) 0.1 % as needed.       No current facility-administered medications on file prior to visit.     patient denies chest pain, shortness of breath, orthopnea. Denies lower extremity edema, abdominal pain, change in appetite, change in bowel movements. Patient denies rashes, musculoskeletal complaints. No other specific complaints in a complete review of systems.  BP 114/66  Pulse 68  Temp(Src) 98.7 F (37.1 C) (Oral)  Ht 5\' 2"  (1.575 m)  Wt 165 lb (74.844 kg)  BMI 30.17 kg/m2  Well-developed well-nourished female in no acute distress. HEENT exam atraumatic, normocephalic, extraocular muscles are intact. Neck is supple. No jugular venous distention no thyromegaly. Chest clear to auscultation without increased work of breathing. Cardiac exam S1 and S2 are regular. Abdominal exam active bowel sounds, soft, nontender. Extremities no edema. Neurologic exam she is alert without any motor sensory deficits. Gait is normal.

## 2013-09-28 NOTE — Assessment & Plan Note (Signed)
Needs laboratory work. We'll check today. 

## 2013-10-03 ENCOUNTER — Encounter: Payer: Self-pay | Admitting: Podiatry

## 2013-10-03 ENCOUNTER — Ambulatory Visit (INDEPENDENT_AMBULATORY_CARE_PROVIDER_SITE_OTHER): Payer: Medicare Other | Admitting: Podiatry

## 2013-10-03 VITALS — BP 143/74 | HR 83 | Resp 16

## 2013-10-03 DIAGNOSIS — Q828 Other specified congenital malformations of skin: Secondary | ICD-10-CM

## 2013-10-03 DIAGNOSIS — B351 Tinea unguium: Secondary | ICD-10-CM

## 2013-10-03 DIAGNOSIS — M79609 Pain in unspecified limb: Secondary | ICD-10-CM

## 2013-10-03 NOTE — Patient Instructions (Signed)
Diabetes and Foot Care Diabetes may cause you to have problems because of poor blood supply (circulation) to your feet and legs. This may cause the skin on your feet to become thinner, break easier, and heal more slowly. Your skin may become dry, and the skin may peel and crack. You may also have nerve damage in your legs and feet causing decreased feeling in them. You may not notice minor injuries to your feet that could lead to infections or more serious problems. Taking care of your feet is one of the most important things you can do for yourself.  HOME CARE INSTRUCTIONS  Wear shoes at all times, even in the house. Do not go barefoot. Bare feet are easily injured.  Check your feet daily for blisters, cuts, and redness. If you cannot see the bottom of your feet, use a mirror or ask someone for help.  Wash your feet with warm water (do not use hot water) and mild soap. Then pat your feet and the areas between your toes until they are completely dry. Do not soak your feet as this can dry your skin.  Apply a moisturizing lotion or petroleum jelly (that does not contain alcohol and is unscented) to the skin on your feet and to dry, brittle toenails. Do not apply lotion between your toes.  Trim your toenails straight across. Do not dig under them or around the cuticle. File the edges of your nails with an emery board or nail file.  Do not cut corns or calluses or try to remove them with medicine.  Wear clean socks or stockings every day. Make sure they are not too tight. Do not wear knee-high stockings since they may decrease blood flow to your legs.  Wear shoes that fit properly and have enough cushioning. To break in new shoes, wear them for just a few hours a day. This prevents you from injuring your feet. Always look in your shoes before you put them on to be sure there are no objects inside.  Do not cross your legs. This may decrease the blood flow to your feet.  If you find a minor scrape,  cut, or break in the skin on your feet, keep it and the skin around it clean and dry. These areas may be cleansed with mild soap and water. Do not cleanse the area with peroxide, alcohol, or iodine.  When you remove an adhesive bandage, be sure not to damage the skin around it.  If you have a wound, look at it several times a day to make sure it is healing.  Do not use heating pads or hot water bottles. They may burn your skin. If you have lost feeling in your feet or legs, you may not know it is happening until it is too late.  Make sure your health care provider performs a complete foot exam at least annually or more often if you have foot problems. Report any cuts, sores, or bruises to your health care provider immediately. SEEK MEDICAL CARE IF:   You have an injury that is not healing.  You have cuts or breaks in the skin.  You have an ingrown nail.  You notice redness on your legs or feet.  You feel burning or tingling in your legs or feet.  You have pain or cramps in your legs and feet.  Your legs or feet are numb.  Your feet always feel cold. SEEK IMMEDIATE MEDICAL CARE IF:   There is increasing redness,   swelling, or pain in or around a wound.  There is a red line that goes up your leg.  Pus is coming from a wound.  You develop a fever or as directed by your health care provider.  You notice a bad smell coming from an ulcer or wound. Document Released: 07/11/2000 Document Revised: 03/16/2013 Document Reviewed: 12/21/2012 [employer] Patient Information 2014 [employer]  

## 2013-10-04 NOTE — Progress Notes (Signed)
Patient ID: Anita Mccormick, female   DOB: 1946/05/24, 68 y.o.   MRN: [id number]  Subjective: This diabetic female presents for ongoing debridement of painful mycotic toenails and keratoses.  Objective: Elongated, hypertrophic, discolored toenails x10. Hyperkeratoses on fifth digits bilaterally and nucleated plantar keratoses bilaterally.  Assessment: Symptomatic onychomycoses x10 Keratoses and porokeratoses total of 4 lesions  Plan: Nails and keratoses debrided back without any leading. Reappoint at three-month intervals.

## 2013-10-25 ENCOUNTER — Ambulatory Visit (INDEPENDENT_AMBULATORY_CARE_PROVIDER_SITE_OTHER): Payer: Medicare Other | Admitting: Family

## 2013-10-25 ENCOUNTER — Encounter: Payer: Self-pay | Admitting: Family

## 2013-10-25 VITALS — BP 132/64 | HR 82 | Temp 99.0°F | Wt 161.0 lb

## 2013-10-25 DIAGNOSIS — J309 Allergic rhinitis, unspecified: Secondary | ICD-10-CM

## 2013-10-25 DIAGNOSIS — R059 Cough, unspecified: Secondary | ICD-10-CM

## 2013-10-25 DIAGNOSIS — R05 Cough: Secondary | ICD-10-CM

## 2013-10-25 MED ORDER — GUAIFENESIN-CODEINE 100-10 MG/5ML PO SYRP: 5.0000 mL | ORAL_SOLUTION | Freq: Three times a day (TID) | ORAL | Status: DC | PRN

## 2013-10-25 NOTE — Progress Notes (Signed)
Pre visit review using our clinic review tool, if applicable. No additional management support is needed unless otherwise documented below in the visit note. 

## 2013-10-25 NOTE — Progress Notes (Signed)
Subjective:    Patient ID: Anita Mccormick, female    DOB: 19-Jun-1946, 68 y.o.   MRN: [id number]  HPI  68 year old African American female, nonsmoker is in today with complaints of cough, tickle in her throat, scratchy throat, itchy ears x2 weeks. She's been taking Mucinex DM without much relief. Cough is productive of clear phlegm. Denies fever muscle aches or pain.  Review of Systems  Constitutional: Negative.   HENT: Positive for congestion and postnasal drip.   Respiratory: Positive for cough. Negative for shortness of breath and wheezing.   Cardiovascular: Negative.   Gastrointestinal: Negative.   Musculoskeletal: Negative.   Skin: Negative.   Allergic/Immunologic: Negative.   Psychiatric/Behavioral: Negative.    Past Medical History  Diagnosis Date  . Cancer     history of no adjunctive RX( no chemo no RT)  . Hx of colonic polyps   . Diabetes mellitus     Type 2  . Hypertension   . Cerebrovascular accident   . Eczema   . COLONIC POLYPS, HX OF 12/10/2006    Qualifier: Diagnosis of  By: Leanne Chang MD, Bruce      History   Social History  . Marital Status: Married    Spouse Name: N/A    Number of Children: N/A  . Years of Education: N/A   Occupational History  . Not on file.   Social History Main Topics  . Smoking status: Current Every Day Smoker -- 0.25 packs/day    Types: Cigarettes  . Smokeless tobacco: Never Used     Comment: 3 cigs a day  . Alcohol Use: Yes     Comment: a drink every now and again  . Drug Use: No  . Sexual Activity: Not on file   Other Topics Concern  . Not on file   Social History Narrative  . No narrative on file    Past Surgical History  Procedure Laterality Date  . Mastectomy      Family History  Problem Relation Age of Onset  . Stroke Mother   . Cancer Father     prostate    Allergies  Allergen Reactions  . Ace Inhibitors Cough    ????  . Chlorthalidone     REACTION: unspecified  . Metformin     REACTION: gi side  effects  . Penicillins     REACTION: urticaria (hives)  . Sulfamethoxazole     REACTION: questionable    Current Outpatient Prescriptions on File Prior to Visit  Medication Sig Dispense Refill  . amLODipine (NORVASC) 5 MG tablet Take 1 tablet (5 mg total) by mouth daily.  90 tablet  3  . aspirin 325 MG tablet Take 325 mg by mouth daily.        [name] atenolol (TENORMIN) 100 MG tablet TAKE 1 TABLET BY MOUTH TWICE A DAY  180 tablet  3  . Calcium Carbonate-Vitamin D (CALCIUM PLUS VITAMIN D) 600-100 MG-UNIT CAPS Take by mouth 2 (two) times daily.        [name] glipiZIDE (GLUCOTROL XL) 5 MG 24 hr tablet TAKE 1 TABLET EVERY DAY  60 tablet  5  . glucose blood (ONE TOUCH ULTRA TEST) test strip 1 each by Other route 2 (two) times daily. Use as instructed  100 each  3  . losartan (COZAAR) 100 MG tablet Take 1 tablet (100 mg total) by mouth daily.  90 tablet  3  . sitaGLIPtin (JANUVIA) 100 MG tablet TAKE 1/2 TABLET (50 MG TOTAL) BY  MOUTH DAILY.      [name] triamcinolone cream (KENALOG) 0.1 % as needed.       No current facility-administered medications on file prior to visit.    BP 132/64  Pulse 82  Temp(Src) 99 F (37.2 C) (Oral)  Wt 161 lb (73.029 kg)  SpO2 99%chart    Objective:   Physical Exam  Constitutional: She appears well-developed and well-nourished.  HENT:  Right Ear: External ear normal.  Left Ear: External ear normal.  Nose: Nose normal.  Mouth/Throat: Oropharynx is clear and moist.  Neck: Normal range of motion.  Cardiovascular: Normal rate, regular rhythm and normal heart sounds.   Pulmonary/Chest: Effort normal.  Musculoskeletal: Normal range of motion.  Neurological: She is alert.  Skin: Skin is warm and dry.  Psychiatric: She has a normal mood and affect.          Assessment & Plan:  Anita Mccormick was seen today for cough.  Diagnoses and associated orders for this visit:  Cough  Allergic rhinitis  Other Orders - guaiFENesin-codeine (CHERATUSSIN AC) 100-10 MG/5ML syrup; Take  5 mLs by mouth 3 (three) times daily as needed.    Zyrtec 10 mg once daily. Call the office if symptoms worsen or persist.

## 2013-10-25 NOTE — Patient Instructions (Signed)
Zyrtec 10 mg Once a day   Hay Fever Hay fever is an allergic reaction to particles in the air. It cannot be passed from person to person. It cannot be cured, but it can be controlled. CAUSES  Hay fever is caused by something that triggers an allergic reaction (allergens). The following are examples of allergens:  Ragweed.  Feathers.  Animal dander.  Grass and tree pollens.  Cigarette smoke.  House dust.  Pollution. SYMPTOMS   Sneezing.  Runny or stuffy nose.  Tearing eyes.  Itchy eyes, nose, mouth, throat, skin, or other area.  Sore throat.  Headache.  Decreased sense of smell or taste. DIAGNOSIS Your caregiver will perform a physical exam and ask questions about the symptoms you are having.Allergy testing may be done to determine exactly what triggers your hay fever.  TREATMENT   Over-the-counter medicines may help symptoms. These include:  Antihistamines.  Decongestants. These may help with nasal congestion.  Your caregiver may prescribe medicines if over-the-counter medicines do not work.  Some people benefit from allergy shots when other medicines are not helpful. HOME CARE INSTRUCTIONS   Avoid the allergen that is causing your symptoms, if possible.  Take all medicine as told by your caregiver. SEEK MEDICAL CARE IF:   You have severe allergy symptoms and your current medicines are not helping.  Your treatment was working at one time, but you are now experiencing symptoms.  You have sinus congestion and pressure.  You develop a fever or headache.  You have thick nasal discharge.  You have asthma and have a worsening cough and wheezing. SEEK IMMEDIATE MEDICAL CARE IF:   You have swelling of your tongue or lips.  You have trouble breathing.  You feel lightheaded or like you are going to faint.  You have cold sweats.  You have a fever. Document Released: 07/14/2005 Document Revised: 10/06/2011 Document Reviewed: 10/09/2010 [facility]  Patient Information 2014 [place].

## 2013-11-02 ENCOUNTER — Other Ambulatory Visit: Payer: Self-pay | Admitting: Internal Medicine

## 2013-11-04 ENCOUNTER — Other Ambulatory Visit: Payer: Self-pay | Admitting: Internal Medicine

## 2014-01-11 ENCOUNTER — Ambulatory Visit: Payer: Medicare Other | Admitting: Podiatry

## 2014-02-06 ENCOUNTER — Encounter: Payer: Self-pay | Admitting: Podiatry

## 2014-02-06 ENCOUNTER — Ambulatory Visit (INDEPENDENT_AMBULATORY_CARE_PROVIDER_SITE_OTHER): Payer: Medicare Other | Admitting: Podiatry

## 2014-02-06 VITALS — BP 146/76 | HR 78 | Resp 18

## 2014-02-06 DIAGNOSIS — B351 Tinea unguium: Secondary | ICD-10-CM

## 2014-02-06 DIAGNOSIS — M79673 Pain in unspecified foot: Secondary | ICD-10-CM

## 2014-02-06 DIAGNOSIS — M79609 Pain in unspecified limb: Secondary | ICD-10-CM

## 2014-02-06 DIAGNOSIS — Q828 Other specified congenital malformations of skin: Secondary | ICD-10-CM

## 2014-02-06 NOTE — Patient Instructions (Signed)
Remove the bandage on the fifth left toe in 24 hours Apply topical antibiotic ointment to the fifth toe daily, cover with gauze until a scab forms

## 2014-02-07 NOTE — Progress Notes (Signed)
Patient ID: Anita Mccormick, female   DOB: 10/06/1945, 68 y.o.   MRN: [id number]  Subjective: This patient presents for ongoing debridement of painful toenails and keratoses  Objective: Hypertrophic, elongated, discolored toenails 6-10 Keratoses lateral fifth nail groove  Assessment: Symptomatic onychomycoses 6-10 Keratoses x1  Plan: Nails x10 debrided without a bleeding Keratoses lateral fifth toe debrided with slight bleeding. The area was dressed with about ointment and gauze dressing  Patient was advised to apply topical antibiotic ointment to the fifth toe daily until skin forms  Reappoint at three-month intervals or sooner if patient has concern

## 2014-02-24 ENCOUNTER — Other Ambulatory Visit: Payer: Self-pay | Admitting: Internal Medicine

## 2014-02-24 DIAGNOSIS — E119 Type 2 diabetes mellitus without complications: Secondary | ICD-10-CM

## 2014-03-02 ENCOUNTER — Other Ambulatory Visit: Payer: Self-pay | Admitting: Internal Medicine

## 2014-03-13 ENCOUNTER — Other Ambulatory Visit: Payer: Self-pay | Admitting: Internal Medicine

## 2014-03-15 ENCOUNTER — Other Ambulatory Visit: Payer: Self-pay | Admitting: Internal Medicine

## 2014-03-27 ENCOUNTER — Other Ambulatory Visit: Payer: Self-pay | Admitting: Internal Medicine

## 2014-03-28 ENCOUNTER — Other Ambulatory Visit: Payer: Self-pay | Admitting: Internal Medicine

## 2014-03-29 ENCOUNTER — Ambulatory Visit: Payer: Medicare Other | Admitting: Internal Medicine

## 2014-04-04 ENCOUNTER — Encounter: Payer: Self-pay | Admitting: Family

## 2014-04-11 ENCOUNTER — Ambulatory Visit (INDEPENDENT_AMBULATORY_CARE_PROVIDER_SITE_OTHER): Payer: Medicare Other | Admitting: Family

## 2014-04-11 ENCOUNTER — Telehealth: Payer: Self-pay | Admitting: Family

## 2014-04-11 ENCOUNTER — Encounter: Payer: Self-pay | Admitting: Family

## 2014-04-11 ENCOUNTER — Ambulatory Visit: Payer: Medicare Other | Admitting: Internal Medicine

## 2014-04-11 VITALS — BP 112/60 | HR 61 | Ht 62.0 in | Wt 157.9 lb

## 2014-04-11 DIAGNOSIS — I10 Essential (primary) hypertension: Secondary | ICD-10-CM

## 2014-04-11 DIAGNOSIS — E7800 Pure hypercholesterolemia, unspecified: Secondary | ICD-10-CM

## 2014-04-11 DIAGNOSIS — E119 Type 2 diabetes mellitus without complications: Secondary | ICD-10-CM

## 2014-04-11 LAB — HEMOGLOBIN A1C: Hgb A1c MFr Bld: 8.5 % — ABNORMAL HIGH (ref 4.6–6.5)

## 2014-04-11 LAB — HEPATIC FUNCTION PANEL
ALT: 54 U/L — AB (ref 0–35)
AST: 78 U/L — ABNORMAL HIGH (ref 0–37)
Albumin: 3.8 g/dL (ref 3.5–5.2)
Alkaline Phosphatase: 93 U/L (ref 39–117)
BILIRUBIN TOTAL: 1.3 mg/dL — AB (ref 0.2–1.2)
Bilirubin, Direct: 0.3 mg/dL (ref 0.0–0.3)
Total Protein: 7.9 g/dL (ref 6.0–8.3)

## 2014-04-11 LAB — LIPID PANEL
Cholesterol: 175 mg/dL (ref 0–200)
HDL: 73 mg/dL (ref >39.00)
LDL CALC: 80 mg/dL (ref 0–99)
NonHDL: 102
Total CHOL/HDL Ratio: 2
Triglycerides: 111 mg/dL (ref 0.0–149.0)
VLDL: 22.2 mg/dL (ref 0.0–40.0)

## 2014-04-11 LAB — BASIC METABOLIC PANEL
BUN: 13 mg/dL (ref 6–23)
CALCIUM: 10.1 mg/dL (ref 8.4–10.5)
CO2: 24 mEq/L (ref 19–32)
Chloride: 99 mEq/L (ref 96–112)
Creatinine, Ser: 1 mg/dL (ref 0.4–1.2)
GFR: 73.57 mL/min (ref >60.00)
GLUCOSE: 300 mg/dL — AB (ref 70–99)
Potassium: 4 mEq/L (ref 3.5–5.1)
Sodium: 137 mEq/L (ref 135–145)

## 2014-04-11 LAB — TSH: TSH: 1.32 u[IU]/mL (ref 0.35–4.50)

## 2014-04-11 MED ORDER — ATENOLOL 100 MG PO TABS: ORAL_TABLET | ORAL | Status: DC

## 2014-04-11 MED ORDER — GLIPIZIDE ER 5 MG PO TB24: ORAL_TABLET | ORAL | Status: DC

## 2014-04-11 MED ORDER — SITAGLIPTIN PHOSPHATE 100 MG PO TABS: ORAL_TABLET | ORAL | Status: DC

## 2014-04-11 NOTE — Progress Notes (Signed)
Subjective:    Patient ID: Anita Mccormick, female    DOB: Jan 29, 1946, 68 y.o.   MRN: [id number]  HPI 68 year old African American female, nonsmoker with a history of type 2 diabetes, hypertension, hyperlipidemia 10 today to establish with me. She is an active patient. Does not routinely exercise.   Review of Systems  Constitutional: Negative.   HENT: Negative.   Respiratory: Negative.   Cardiovascular: Negative.   Gastrointestinal: Negative.   Endocrine: Negative.   Genitourinary: Negative.   Musculoskeletal: Negative.   Skin: Negative.   Allergic/Immunologic: Negative.   Neurological: Negative.   Hematological: Negative.   Psychiatric/Behavioral: Negative.    Past Medical History  Diagnosis Date  . Cancer     history of no adjunctive RX( no chemo no RT)  . Hx of colonic polyps   . Diabetes mellitus     Type 2  . Hypertension   . Cerebrovascular accident   . Eczema   . COLONIC POLYPS, HX OF 12/10/2006    Qualifier: Diagnosis of  By: Leanne Chang MD, Bruce      History   Social History  . Marital Status: Married    Spouse Name: N/A    Number of Children: N/A  . Years of Education: N/A   Occupational History  . Not on file.   Social History Main Topics  . Smoking status: Current Every Day Smoker -- 0.25 packs/day    Types: Cigarettes  . Smokeless tobacco: Never Used     Comment: 3 cigs a day  . Alcohol Use: Yes     Comment: a drink every now and again  . Drug Use: No  . Sexual Activity: Not on file   Other Topics Concern  . Not on file   Social History Narrative  . No narrative on file    Past Surgical History  Procedure Laterality Date  . Mastectomy      Family History  Problem Relation Age of Onset  . Stroke Mother   . Cancer Father     prostate    Allergies  Allergen Reactions  . Ace Inhibitors Cough    ????  . Chlorthalidone     REACTION: unspecified  . Metformin     REACTION: gi side effects  . Penicillins     REACTION: urticaria  (hives)  . Sulfamethoxazole     REACTION: questionable    Current Outpatient Prescriptions on File Prior to Visit  Medication Sig Dispense Refill  . aspirin 325 MG tablet Take 325 mg by mouth daily.        . Calcium Carbonate-Vitamin D (CALCIUM PLUS VITAMIN D) 600-100 MG-UNIT CAPS Take by mouth 2 (two) times daily.        [name] glucose blood (ONE TOUCH ULTRA TEST) test strip 1 each by Other route 2 (two) times daily. Use as instructed  100 each  3  . losartan (COZAAR) 100 MG tablet TAKE 1 TABLET (100 MG TOTAL) BY MOUTH DAILY.  90 tablet  3  . triamcinolone cream (KENALOG) 0.1 % as needed.      [name] amLODipine (NORVASC) 5 MG tablet TAKE 1 TABLET (5 MG TOTAL) BY MOUTH DAILY.  90 tablet  1   No current facility-administered medications on file prior to visit.    BP 112/60  Pulse 61  Ht 5\' 2"  (1.575 m)  Wt 157 lb 14.4 oz (71.623 kg)  BMI 28.87 kg/m2  SpO2 96%chart    Objective:   Physical Exam  Constitutional: She is  oriented to person, place, and time. She appears well-developed and well-nourished.  HENT:  Right Ear: External ear normal.  Left Ear: External ear normal.  Nose: Nose normal.  Mouth/Throat: Oropharynx is clear and moist.  Neck: Normal range of motion. Neck supple.  Cardiovascular: Normal rate, regular rhythm and normal heart sounds.   Pulmonary/Chest: Effort normal and breath sounds normal.  Abdominal: Soft. Bowel sounds are normal.  Musculoskeletal: Normal range of motion.  Neurological: She is alert and oriented to person, place, and time.  Skin: Skin is warm and dry.  Psychiatric: She has a normal mood and affect.          Assessment & Plan:  [name] was seen today for establish care.  Diagnoses and associated orders for this visit:  DIABETES MELLITUS, TYPE II - Hemoglobin X2K - Basic metabolic panel - Microalbumin/Creatinine Ratio, Urine - TSH  Unspecified essential hypertension - Lipid Panel - Hepatic Function Panel - TSH  Pure  hypercholesterolemia - Lipid Panel - Hepatic Function Panel - TSH  Other Orders - sitaGLIPtin (JANUVIA) 100 MG tablet; TAKE 1/2 TABLET (50 MG TOTAL) BY MOUTH DAILY. - glipiZIDE (GLUCOTROL XL) 5 MG 24 hr tablet; TAKE 1 TABLET EVERY DAY - atenolol (TENORMIN) 100 MG tablet; TAKE 1 TABLET BY MOUTH TWICE A DAY   Call the office with any questions or concerns. Recheck as scheduled and as needed.

## 2014-04-11 NOTE — Telephone Encounter (Signed)
Pt is calling back to let tamesha know her med list is correct

## 2014-04-12 ENCOUNTER — Other Ambulatory Visit: Payer: Self-pay | Admitting: Family

## 2014-04-12 LAB — MICROALBUMIN / CREATININE URINE RATIO
Creatinine,U: 92.9 mg/dL
Microalb Creat Ratio: 0.2 mg/g (ref 0.0–30.0)
Microalb, Ur: 0.2 mg/dL (ref 0.0–1.9)

## 2014-04-12 MED ORDER — GLIPIZIDE ER 10 MG PO TB24: ORAL_TABLET | ORAL | Status: DC

## 2014-04-12 NOTE — Telephone Encounter (Signed)
Noted  

## 2014-05-08 ENCOUNTER — Ambulatory Visit: Payer: Medicare Other | Admitting: Podiatry

## 2014-05-20 ENCOUNTER — Telehealth: Payer: Self-pay

## 2014-05-20 NOTE — Telephone Encounter (Signed)
LVM for pt to call back.    RE: scheduling AWV for 2015 with NP or PA if pt allows.  

## 2014-06-17 ENCOUNTER — Telehealth: Payer: Self-pay

## 2014-06-17 NOTE — Telephone Encounter (Signed)
Spoke to pt to schedule AWV and pt stated that she would call back.

## 2014-06-30 ENCOUNTER — Other Ambulatory Visit (HOSPITAL_COMMUNITY)
Admission: RE | Admit: 2014-06-30 | Discharge: 2014-06-30 | Disposition: A | Discharge status: Home / Self Care | Payer: Medicare Other | Source: Ambulatory Visit | Attending: Family | Admitting: Family

## 2014-06-30 ENCOUNTER — Ambulatory Visit (INDEPENDENT_AMBULATORY_CARE_PROVIDER_SITE_OTHER): Payer: Medicare Other | Admitting: Family

## 2014-06-30 ENCOUNTER — Encounter: Payer: Self-pay | Admitting: Family

## 2014-06-30 VITALS — BP 112/60 | HR 71 | Ht 62.0 in | Wt 160.0 lb

## 2014-06-30 DIAGNOSIS — Z113 Encounter for screening for infections with a predominantly sexual mode of transmission: Secondary | ICD-10-CM | POA: Insufficient documentation

## 2014-06-30 DIAGNOSIS — I10 Essential (primary) hypertension: Secondary | ICD-10-CM

## 2014-06-30 DIAGNOSIS — N760 Acute vaginitis: Secondary | ICD-10-CM | POA: Diagnosis present

## 2014-06-30 DIAGNOSIS — Z124 Encounter for screening for malignant neoplasm of cervix: Secondary | ICD-10-CM | POA: Insufficient documentation

## 2014-06-30 DIAGNOSIS — Z0000 Encounter for general adult medical examination without abnormal findings: Secondary | ICD-10-CM

## 2014-06-30 DIAGNOSIS — E1165 Type 2 diabetes mellitus with hyperglycemia: Secondary | ICD-10-CM

## 2014-06-30 DIAGNOSIS — IMO0002 Reserved for concepts with insufficient information to code with codable children: Secondary | ICD-10-CM

## 2014-06-30 DIAGNOSIS — Z23 Encounter for immunization: Secondary | ICD-10-CM

## 2014-06-30 NOTE — Progress Notes (Signed)
Pre visit review using our clinic review tool, if applicable. No additional management support is needed unless otherwise documented below in the visit note. 

## 2014-06-30 NOTE — Patient Instructions (Signed)
Exercise to Stay Healthy Exercise helps you become and stay healthy. EXERCISE IDEAS AND TIPS Choose exercises that:  You enjoy.  Fit into your day. You do not need to exercise really hard to be healthy. You can do exercises at a slow or medium level and stay healthy. You can:  Stretch before and after working out.  Try yoga, Pilates, or tai chi.  Lift weights.  Walk fast, swim, jog, run, climb stairs, bicycle, dance, or rollerskate.  Take aerobic classes. Exercises that burn about 150 calories:  Running 1  miles in 15 minutes.  Playing volleyball for 45 to 60 minutes.  Washing and waxing a car for 45 to 60 minutes.  Playing touch football for 45 minutes.  Walking 1  miles in 35 minutes.  Pushing a stroller 1  miles in 30 minutes.  Playing basketball for 30 minutes.  Raking leaves for 30 minutes.  Bicycling 5 miles in 30 minutes.  Walking 2 miles in 30 minutes.  Dancing for 30 minutes.  Shoveling snow for 15 minutes.  Swimming laps for 20 minutes.  Walking up stairs for 15 minutes.  Bicycling 4 miles in 15 minutes.  Gardening for 30 to 45 minutes.  Jumping rope for 15 minutes.  Washing windows or floors for 45 to 60 minutes. Document Released: 08/16/2010 Document Revised: 10/06/2011 Document Reviewed: 08/16/2010 [employer] Patient Information 2015 [employer]. This information is not intended to replace advice given to you by your health care provider. Make sure you discuss any questions you have with your health care provider.  

## 2014-06-30 NOTE — Progress Notes (Signed)
Subjective:    Patient ID: Anita Mccormick, female    DOB: Dec 12, 1945, 67 y.o.   MRN: [id number]  HPI 68 year old African-American female, smoker is in today for complete physical exam. She has a history of type 2 diabetes, hypertension, hyperlipidemia, history of breast cancer. Had a left mastectomy. Reports doing well. Stable on all medications. Does not routinely check blood glucose.   I reviewed all health maintenance protocols including mammography, colonoscopy, bone density Needed referrals were placed. Age and diagnosis  appropriate screening labs were ordered. Her immunization history was reviewed and appropriate vaccinations were ordered. Her current medications and allergies were reviewed and needed refills of her chronic medications were ordered. The plan for yearly health maintenance was discussed all orders and referrals were made as appropriate.   Review of Systems  Constitutional: Negative.   HENT: Negative.   Eyes: Negative.   Respiratory: Negative.   Cardiovascular: Negative.   Gastrointestinal: Negative.   Endocrine: Negative.   Genitourinary: Negative.   Musculoskeletal: Negative.   Skin: Negative.   Allergic/Immunologic: Negative.   Neurological: Negative.   Hematological: Negative.   Psychiatric/Behavioral: Negative.    Past Medical History  Diagnosis Date  . Cancer     history of no adjunctive RX( no chemo no RT)  . Hx of colonic polyps   . Diabetes mellitus     Type 2  . Hypertension   . Cerebrovascular accident   . Eczema   . COLONIC POLYPS, HX OF 12/10/2006    Qualifier: Diagnosis of  By: Leanne Chang MD, Bruce      History   Social History  . Marital Status: Married    Spouse Name: N/A    Number of Children: N/A  . Years of Education: N/A   Occupational History  . Not on file.   Social History Main Topics  . Smoking status: Current Every Day Smoker -- 0.25 packs/day    Types: Cigarettes  . Smokeless tobacco: Never Used     Comment: 3 cigs a  day  . Alcohol Use: Yes     Comment: a drink every now and again  . Drug Use: No  . Sexual Activity: Not on file   Other Topics Concern  . Not on file   Social History Narrative    Past Surgical History  Procedure Laterality Date  . Mastectomy      Family History  Problem Relation Age of Onset  . Stroke Mother   . Cancer Father     prostate    Allergies  Allergen Reactions  . Ace Inhibitors Cough    ????  . Chlorthalidone     REACTION: unspecified  . Metformin     REACTION: gi side effects  . Penicillins     REACTION: urticaria (hives)  . Sulfamethoxazole     REACTION: questionable    Current Outpatient Prescriptions on File Prior to Visit  Medication Sig Dispense Refill  . amLODipine (NORVASC) 5 MG tablet TAKE 1 TABLET (5 MG TOTAL) BY MOUTH DAILY. 90 tablet 1  . aspirin 325 MG tablet Take 325 mg by mouth daily.      [name] atenolol (TENORMIN) 100 MG tablet TAKE 1 TABLET BY MOUTH TWICE A DAY 180 tablet 1  . Calcium Carbonate-Vitamin D (CALCIUM PLUS VITAMIN D) 600-100 MG-UNIT CAPS Take by mouth 2 (two) times daily.      [name] glipiZIDE (GLUCOTROL XL) 10 MG 24 hr tablet TAKE 1 TABLET EVERY DAY 90 tablet 1  . glucose blood (  ONE TOUCH ULTRA TEST) test strip 1 each by Other route 2 (two) times daily. Use as instructed 100 each 3  . losartan (COZAAR) 100 MG tablet TAKE 1 TABLET (100 MG TOTAL) BY MOUTH DAILY. 90 tablet 3  . sitaGLIPtin (JANUVIA) 100 MG tablet TAKE 1/2 TABLET (50 MG TOTAL) BY MOUTH DAILY. 45 tablet 1  . triamcinolone cream (KENALOG) 0.1 % as needed.     No current facility-administered medications on file prior to visit.    BP 112/60 mmHg  Pulse 71  Ht 5\' 2"  (1.575 m)  Wt 160 lb (72.576 kg)  BMI 29.26 kg/m2chart    Objective:   Physical Exam  Constitutional: She is oriented to person, place, and time. She appears well-developed and well-nourished.  HENT:  Head: Normocephalic.  Right Ear: External ear normal.  Left Ear: External ear normal.  Nose: Nose  normal.  Mouth/Throat: Oropharynx is clear and moist.  Eyes: Conjunctivae are normal. Pupils are equal, round, and reactive to light.  Neck: Normal range of motion. Neck supple. No thyromegaly present.  Cardiovascular: Normal rate, regular rhythm and normal heart sounds.   Pulmonary/Chest: Effort normal and breath sounds normal.    Abdominal: Soft. Bowel sounds are normal.  Musculoskeletal: Normal range of motion.  Neurological: She is alert and oriented to person, place, and time.  Skin: Skin is warm and dry.  Psychiatric: She has a normal mood and affect.          Assessment & Plan:  Anita Mccormick was seen today for annual exam.  Diagnoses and associated orders for this visit:  Diabetes type 2, uncontrolled - Basic Metabolic Panel; Future - Hepatic Function Panel; Future - CBC with Differential; Future - POC Urinalysis Dipstick - Lipid Panel; Future - Hemoglobin A1c; Future  Essential hypertension - EKG 99-IPJA - Basic Metabolic Panel; Future - Hepatic Function Panel; Future - CBC with Differential; Future - POC Urinalysis Dipstick - Lipid Panel; Future - Hemoglobin A1c; Future  Need for prophylactic vaccination against Streptococcus pneumoniae (pneumococcus) - Pneumococcal conjugate vaccine 13-valent  Screening for malignant neoplasm of cervix - Cancel: PAP [[place]] - Cytology - PAP - Basic Metabolic Panel; Future - Hepatic Function Panel; Future - CBC with Differential; Future - POC Urinalysis Dipstick - Lipid Panel; Future - Hemoglobin A1c; Future  Medicare annual wellness visit, subsequent - Basic Metabolic Panel; Future - Hepatic Function Panel; Future - CBC with Differential; Future - POC Urinalysis Dipstick - Lipid Panel; Future - Hemoglobin A1c; Future   encouraged healthy diet, exercise and monthly self breast exams. Colonoscopy screening currently up-to-date. Mammogram as scheduled next year. Recheck in 3-4 months and sooner as needed.

## 2014-07-03 ENCOUNTER — Telehealth: Payer: Self-pay | Admitting: Family

## 2014-07-03 LAB — CYTOLOGY - PAP

## 2014-07-03 NOTE — Telephone Encounter (Signed)
emmi emailed °

## 2014-07-04 LAB — CERVICOVAGINAL ANCILLARY ONLY
Bacterial vaginitis: POSITIVE — AB
Candida vaginitis: POSITIVE — AB

## 2014-07-10 ENCOUNTER — Ambulatory Visit (INDEPENDENT_AMBULATORY_CARE_PROVIDER_SITE_OTHER): Payer: Medicare Other | Admitting: Podiatry

## 2014-07-10 DIAGNOSIS — B351 Tinea unguium: Secondary | ICD-10-CM

## 2014-07-10 DIAGNOSIS — M79676 Pain in unspecified toe(s): Secondary | ICD-10-CM

## 2014-07-11 NOTE — Progress Notes (Signed)
Patient ID: Anita Mccormick, female   DOB: 1945-11-07, 68 y.o.   MRN: [id number]  Subjective: Patient presents again complaining of painful toenails and walking wearing shoes and painful listers corns  Objective: The toenails are elongated, hypertrophic, discolored 6-10 Callusing lateral nail grooves fifth digits bilaterally  Assessment: Symptomatic onychomycoses 6-10 Keratoses 2  Plan: Debrided toenails 10 keratoses 2 without a bleeding  Reappoint 3 months

## 2014-07-14 ENCOUNTER — Other Ambulatory Visit (INDEPENDENT_AMBULATORY_CARE_PROVIDER_SITE_OTHER): Payer: Medicare Other

## 2014-07-14 ENCOUNTER — Encounter: Payer: Self-pay | Admitting: Family

## 2014-07-14 DIAGNOSIS — K760 Fatty (change of) liver, not elsewhere classified: Secondary | ICD-10-CM

## 2014-07-14 DIAGNOSIS — E1165 Type 2 diabetes mellitus with hyperglycemia: Secondary | ICD-10-CM

## 2014-07-14 DIAGNOSIS — Z124 Encounter for screening for malignant neoplasm of cervix: Secondary | ICD-10-CM

## 2014-07-14 DIAGNOSIS — Z0000 Encounter for general adult medical examination without abnormal findings: Secondary | ICD-10-CM

## 2014-07-14 DIAGNOSIS — IMO0002 Reserved for concepts with insufficient information to code with codable children: Secondary | ICD-10-CM

## 2014-07-14 DIAGNOSIS — I10 Essential (primary) hypertension: Secondary | ICD-10-CM

## 2014-07-14 HISTORY — DX: Fatty (change of) liver, not elsewhere classified: K76.0

## 2014-07-14 LAB — LIPID PANEL
CHOLESTEROL: 167 mg/dL (ref 0–200)
HDL: 62.9 mg/dL (ref >39.00)
LDL CALC: 81 mg/dL (ref 0–99)
NonHDL: 104.1
Total CHOL/HDL Ratio: 3
Triglycerides: 115 mg/dL (ref 0.0–149.0)
VLDL: 23 mg/dL (ref 0.0–40.0)

## 2014-07-14 LAB — CBC WITH DIFFERENTIAL/PLATELET
Basophils Absolute: 0.1 10*3/uL (ref 0.0–0.1)
Basophils Relative: 1.4 % (ref 0.0–3.0)
EOS PCT: 8.5 % — AB (ref 0.0–5.0)
Eosinophils Absolute: 0.4 10*3/uL (ref 0.0–0.7)
HCT: 42.7 % (ref 36.0–46.0)
Hemoglobin: 14.2 g/dL (ref 12.0–15.0)
Lymphocytes Relative: 27.2 % (ref 12.0–46.0)
Lymphs Abs: 1.3 10*3/uL (ref 0.7–4.0)
MCHC: 33.3 g/dL (ref 30.0–36.0)
MCV: 108.7 fl — ABNORMAL HIGH (ref 78.0–100.0)
MONO ABS: 0.4 10*3/uL (ref 0.1–1.0)
MONOS PCT: 9.4 % (ref 3.0–12.0)
NEUTROS PCT: 53.5 % (ref 43.0–77.0)
Neutro Abs: 2.5 10*3/uL (ref 1.4–7.7)
PLATELETS: 151 10*3/uL (ref 150.0–400.0)
RBC: 3.93 Mil/uL (ref 3.87–5.11)
RDW: 13.7 % (ref 11.5–15.5)
WBC: 4.6 10*3/uL (ref 4.0–10.5)

## 2014-07-14 LAB — BASIC METABOLIC PANEL
BUN: 11 mg/dL (ref 6–23)
CALCIUM: 9.8 mg/dL (ref 8.4–10.5)
CHLORIDE: 101 meq/L (ref 96–112)
CO2: 28 mEq/L (ref 19–32)
CREATININE: 1 mg/dL (ref 0.4–1.2)
GFR: 72.64 mL/min (ref >60.00)
Glucose, Bld: 196 mg/dL — ABNORMAL HIGH (ref 70–99)
Potassium: 4 mEq/L (ref 3.5–5.1)
SODIUM: 139 meq/L (ref 135–145)

## 2014-07-14 LAB — HEMOGLOBIN A1C: Hgb A1c MFr Bld: 9 % — ABNORMAL HIGH (ref 4.6–6.5)

## 2014-07-14 LAB — HEPATIC FUNCTION PANEL
ALBUMIN: 3.5 g/dL (ref 3.5–5.2)
ALT: 54 U/L — AB (ref 0–35)
AST: 104 U/L — ABNORMAL HIGH (ref 0–37)
Alkaline Phosphatase: 94 U/L (ref 39–117)
Bilirubin, Direct: 0.2 mg/dL (ref 0.0–0.3)
Total Bilirubin: 1 mg/dL (ref 0.2–1.2)
Total Protein: 7.4 g/dL (ref 6.0–8.3)

## 2014-07-17 ENCOUNTER — Other Ambulatory Visit: Payer: Self-pay

## 2014-07-17 MED ORDER — SITAGLIPTIN PHOSPHATE 100 MG PO TABS: ORAL_TABLET | ORAL | Status: DC

## 2014-07-17 MED ORDER — SITAGLIPTIN PHOSPHATE 100 MG PO TABS: 100.0000 mg | ORAL_TABLET | Freq: Every day | ORAL | Status: DC

## 2014-08-23 DIAGNOSIS — L309 Dermatitis, unspecified: Secondary | ICD-10-CM | POA: Diagnosis not present

## 2014-10-09 ENCOUNTER — Encounter: Payer: Self-pay | Admitting: Podiatry

## 2014-10-09 ENCOUNTER — Ambulatory Visit (INDEPENDENT_AMBULATORY_CARE_PROVIDER_SITE_OTHER): Payer: Medicare Other | Admitting: Podiatry

## 2014-10-09 DIAGNOSIS — M79676 Pain in unspecified toe(s): Secondary | ICD-10-CM | POA: Diagnosis not present

## 2014-10-09 DIAGNOSIS — B351 Tinea unguium: Secondary | ICD-10-CM | POA: Diagnosis not present

## 2014-10-09 NOTE — Patient Instructions (Signed)
Diabetes and Foot Care Diabetes may cause you to have problems because of poor blood supply (circulation) to your feet and legs. This may cause the skin on your feet to become thinner, break easier, and heal more slowly. Your skin may become dry, and the skin may peel and crack. You may also have nerve damage in your legs and feet causing decreased feeling in them. You may not notice minor injuries to your feet that could lead to infections or more serious problems. Taking care of your feet is one of the most important things you can do for yourself.  HOME CARE INSTRUCTIONS  Wear shoes at all times, even in the house. Do not go barefoot. Bare feet are easily injured.  Check your feet daily for blisters, cuts, and redness. If you cannot see the bottom of your feet, use a mirror or ask someone for help.  Wash your feet with warm water (do not use hot water) and mild soap. Then pat your feet and the areas between your toes until they are completely dry. Do not soak your feet as this can dry your skin.  Apply a moisturizing lotion or petroleum jelly (that does not contain alcohol and is unscented) to the skin on your feet and to dry, brittle toenails. Do not apply lotion between your toes.  Trim your toenails straight across. Do not dig under them or around the cuticle. File the edges of your nails with an emery board or nail file.  Do not cut corns or calluses or try to remove them with medicine.  Wear clean socks or stockings every day. Make sure they are not too tight. Do not wear knee-high stockings since they may decrease blood flow to your legs.  Wear shoes that fit properly and have enough cushioning. To break in new shoes, wear them for just a few hours a day. This prevents you from injuring your feet. Always look in your shoes before you put them on to be sure there are no objects inside.  Do not cross your legs. This may decrease the blood flow to your feet.  If you find a minor scrape,  cut, or break in the skin on your feet, keep it and the skin around it clean and dry. These areas may be cleansed with mild soap and water. Do not cleanse the area with peroxide, alcohol, or iodine.  When you remove an adhesive bandage, be sure not to damage the skin around it.  If you have a wound, look at it several times a day to make sure it is healing.  Do not use heating pads or hot water bottles. They may burn your skin. If you have lost feeling in your feet or legs, you may not know it is happening until it is too late.  Make sure your health care provider performs a complete foot exam at least annually or more often if you have foot problems. Report any cuts, sores, or bruises to your health care provider immediately. SEEK MEDICAL CARE IF:   You have an injury that is not healing.  You have cuts or breaks in the skin.  You have an ingrown nail.  You notice redness on your legs or feet.  You feel burning or tingling in your legs or feet.  You have pain or cramps in your legs and feet.  Your legs or feet are numb.  Your feet always feel cold. SEEK IMMEDIATE MEDICAL CARE IF:   There is increasing redness,   swelling, or pain in or around a wound.  There is a red line that goes up your leg.  Pus is coming from a wound.  You develop a fever or as directed by your health care provider.  You notice a bad smell coming from an ulcer or wound. Document Released: 07/11/2000 Document Revised: 03/16/2013 Document Reviewed: 12/21/2012 [employer] Patient Information 2015 [employer]. This information is not intended to replace advice given to you by your health care provider. Make sure you discuss any questions you have with your health care provider.  

## 2014-10-10 NOTE — Progress Notes (Signed)
Patient ID: Anita Mccormick, female   DOB: Sep 02, 1945, 69 y.o.   MRN: [id number]  Subjective: Patient presents again complaining of painful toenails and requests nail debridement  Objective: The toenails are hypertrophic, elongated, discolored and tender to palpation 6-10 Keratoses lateral fifth nail grooves bilaterally  Assessment: Symptomatic onychomycoses 6-10 Listers corns fifth toes bilaterally Type 2 diabetes  Plan: Debridement of toenails 10 and keratoses 2 without any bleeding  Reappoint 3 months

## 2014-10-17 DIAGNOSIS — E119 Type 2 diabetes mellitus without complications: Secondary | ICD-10-CM | POA: Diagnosis not present

## 2014-10-23 ENCOUNTER — Other Ambulatory Visit: Payer: Self-pay | Admitting: Family

## 2014-10-30 ENCOUNTER — Ambulatory Visit: Payer: Medicare Other | Admitting: Family

## 2014-11-02 ENCOUNTER — Ambulatory Visit (INDEPENDENT_AMBULATORY_CARE_PROVIDER_SITE_OTHER): Payer: Medicare Other | Admitting: Family

## 2014-11-02 ENCOUNTER — Encounter: Payer: Self-pay | Admitting: Family

## 2014-11-02 VITALS — BP 102/62 | HR 74 | Temp 98.4°F | Ht 62.0 in | Wt 159.9 lb

## 2014-11-02 DIAGNOSIS — E119 Type 2 diabetes mellitus without complications: Secondary | ICD-10-CM

## 2014-11-02 DIAGNOSIS — J309 Allergic rhinitis, unspecified: Secondary | ICD-10-CM

## 2014-11-02 DIAGNOSIS — E785 Hyperlipidemia, unspecified: Secondary | ICD-10-CM | POA: Diagnosis not present

## 2014-11-02 DIAGNOSIS — I10 Essential (primary) hypertension: Secondary | ICD-10-CM | POA: Diagnosis not present

## 2014-11-02 LAB — BASIC METABOLIC PANEL
BUN: 9 mg/dL (ref 6–23)
CO2: 31 mEq/L (ref 19–32)
Calcium: 10.5 mg/dL (ref 8.4–10.5)
Chloride: 98 mEq/L (ref 96–112)
Creatinine, Ser: 0.89 mg/dL (ref 0.40–1.20)
GFR: 81.11 mL/min (ref >60.00)
Glucose, Bld: 163 mg/dL — ABNORMAL HIGH (ref 70–99)
POTASSIUM: 4.1 meq/L (ref 3.5–5.1)
Sodium: 137 mEq/L (ref 135–145)

## 2014-11-02 LAB — CBC WITH DIFFERENTIAL/PLATELET
Basophils Absolute: 0.1 10*3/uL (ref 0.0–0.1)
Basophils Relative: 0.9 % (ref 0.0–3.0)
EOS PCT: 6.8 % — AB (ref 0.0–5.0)
Eosinophils Absolute: 0.4 10*3/uL (ref 0.0–0.7)
HCT: 38.7 % (ref 36.0–46.0)
HEMOGLOBIN: 13.1 g/dL (ref 12.0–15.0)
Lymphocytes Relative: 15.2 % (ref 12.0–46.0)
Lymphs Abs: 1 10*3/uL (ref 0.7–4.0)
MCHC: 33.9 g/dL (ref 30.0–36.0)
MCV: 107.2 fl — AB (ref 78.0–100.0)
Monocytes Absolute: 0.7 10*3/uL (ref 0.1–1.0)
Monocytes Relative: 11 % (ref 3.0–12.0)
Neutro Abs: 4.3 10*3/uL (ref 1.4–7.7)
Neutrophils Relative %: 66.1 % (ref 43.0–77.0)
PLATELETS: 150 10*3/uL (ref 150.0–400.0)
RBC: 3.61 Mil/uL — AB (ref 3.87–5.11)
RDW: 14.6 % (ref 11.5–15.5)
WBC: 6.5 10*3/uL (ref 4.0–10.5)

## 2014-11-02 LAB — HEPATIC FUNCTION PANEL
ALBUMIN: 3.7 g/dL (ref 3.5–5.2)
ALT: 31 U/L (ref 0–35)
AST: 57 U/L — ABNORMAL HIGH (ref 0–37)
Alkaline Phosphatase: 104 U/L (ref 39–117)
Bilirubin, Direct: 0.4 mg/dL — ABNORMAL HIGH (ref 0.0–0.3)
Total Bilirubin: 1.1 mg/dL (ref 0.2–1.2)
Total Protein: 7.6 g/dL (ref 6.0–8.3)

## 2014-11-02 LAB — LIPID PANEL
CHOL/HDL RATIO: 2
CHOLESTEROL: 153 mg/dL (ref 0–200)
HDL: 67.1 mg/dL (ref >39.00)
LDL CALC: 68 mg/dL (ref 0–99)
NonHDL: 85.9
Triglycerides: 89 mg/dL (ref 0.0–149.0)
VLDL: 17.8 mg/dL (ref 0.0–40.0)

## 2014-11-02 LAB — HEMOGLOBIN A1C: Hgb A1c MFr Bld: 8.3 % — ABNORMAL HIGH (ref 4.6–6.5)

## 2014-11-02 MED ORDER — SITAGLIPTIN PHOSPHATE 100 MG PO TABS: 100.0000 mg | ORAL_TABLET | Freq: Every day | ORAL | Status: DC

## 2014-11-02 MED ORDER — LOSARTAN POTASSIUM 100 MG PO TABS
ORAL_TABLET | ORAL | Status: DC
Start: 2014-11-02 — End: 2015-01-25

## 2014-11-02 MED ORDER — GLIPIZIDE ER 10 MG PO TB24: 10.0000 mg | ORAL_TABLET | Freq: Every day | ORAL | Status: DC

## 2014-11-02 MED ORDER — ATENOLOL 100 MG PO TABS: ORAL_TABLET | ORAL | Status: DC

## 2014-11-02 MED ORDER — GUAIFENESIN-CODEINE 100-10 MG/5ML PO SYRP: 5.0000 mL | ORAL_SOLUTION | Freq: Three times a day (TID) | ORAL | Status: DC | PRN

## 2014-11-02 MED ORDER — AMLODIPINE BESYLATE 5 MG PO TABS: ORAL_TABLET | ORAL | Status: DC

## 2014-11-02 NOTE — Progress Notes (Signed)
Pre visit review using our clinic review tool, if applicable. No additional management support is needed unless otherwise documented below in the visit note. 

## 2014-11-02 NOTE — Progress Notes (Signed)
Subjective:    Patient ID: Anita Mccormick, female    DOB: 07-26-1946, 69 y.o.   MRN: [id number]  HPI  69 year old African-American female, smoker with a history of type 2 diabetes, hyperlipidemia, hypertension. Reports doing well. Denies any concerns. Has not had a diabetic eye exam this year. Does not routinely check her blood glucose. Continues to smoke about a half a pack of cigarettes per day.  Review of Systems  Constitutional: Negative.   HENT: Negative.   Respiratory: Negative.   Cardiovascular: Negative.   Gastrointestinal: Negative.   Endocrine: Negative.   Genitourinary: Negative.   Musculoskeletal: Negative.   Skin: Negative.   Allergic/Immunologic: Negative.   Hematological: Negative.   Psychiatric/Behavioral: Negative.    Past Medical History  Diagnosis Date  . Cancer     history of no adjunctive RX( no chemo no RT)  . Hx of colonic polyps   . Diabetes mellitus     Type 2  . Hypertension   . Cerebrovascular accident   . Eczema   . COLONIC POLYPS, HX OF 12/10/2006    Qualifier: Diagnosis of  By: Leanne Chang MD, Bruce      History   Social History  . Marital Status: Married    Spouse Name: N/A  . Number of Children: N/A  . Years of Education: N/A   Occupational History  . Not on file.   Social History Main Topics  . Smoking status: Current Every Day Smoker -- 0.25 packs/day    Types: Cigarettes  . Smokeless tobacco: Never Used     Comment: 3 cigs a day  . Alcohol Use: Yes     Comment: a drink every now and again  . Drug Use: No  . Sexual Activity: Not on file   Other Topics Concern  . Not on file   Social History Narrative    Past Surgical History  Procedure Laterality Date  . Mastectomy      Family History  Problem Relation Age of Onset  . Stroke Mother   . Cancer Father     prostate    Allergies  Allergen Reactions  . Ace Inhibitors Cough    ????  . Chlorthalidone     REACTION: unspecified  . Metformin     REACTION: gi side  effects  . Penicillins     REACTION: urticaria (hives)  . Sulfamethoxazole     REACTION: questionable    Current Outpatient Prescriptions on File Prior to Visit  Medication Sig Dispense Refill  . aspirin 325 MG tablet Take 325 mg by mouth daily.      . Calcium Carbonate-Vitamin D (CALCIUM PLUS VITAMIN D) 600-100 MG-UNIT CAPS Take by mouth 2 (two) times daily.      [name] glucose blood (ONE TOUCH ULTRA TEST) test strip 1 each by Other route 2 (two) times daily. Use as instructed 100 each 3  . hydrOXYzine (ATARAX/VISTARIL) 10 MG tablet Take 10 mg by mouth as needed.   2  . fluocinonide cream (LIDEX) 0.05 %   2  . triamcinolone cream (KENALOG) 0.1 % as needed.     No current facility-administered medications on file prior to visit.    BP 102/62 mmHg  Pulse 74  Temp(Src) 98.4 F (36.9 C) (Oral)  Ht 5\' 2"  (1.575 m)  Wt 159 lb 14.4 oz (72.53 kg)  BMI 29.24 kg/m2chart    Objective:   Physical Exam  Constitutional: She is oriented to person, place, and time. She appears well-developed and well-nourished.  HENT:  Right Ear: External ear normal.  Left Ear: External ear normal.  Nose: Nose normal.  Mouth/Throat: Oropharynx is clear and moist.  Neck: Normal range of motion. Neck supple. No thyromegaly present.  Cardiovascular: Normal rate, regular rhythm and normal heart sounds.   Pulmonary/Chest: Effort normal and breath sounds normal.  Abdominal: Soft. Bowel sounds are normal.  Musculoskeletal: Normal range of motion.  Neurological: She is alert and oriented to person, place, and time.  Skin: Skin is warm and dry.  Psychiatric: She has a normal mood and affect.          Assessment & Plan:  Dereonna was seen today for follow-up.  Diagnoses and all orders for this visit:  Essential hypertension Orders: -     Basic Metabolic Panel -     Hepatic Function Panel  Type 2 diabetes mellitus without complication Orders: -     Hemoglobin A1c  Allergic rhinitis, unspecified allergic  rhinitis type Orders: -     CBC with Differential  Hyperlipemia Orders: -     Lipid Panel  Other orders -     sitaGLIPtin (JANUVIA) 100 MG tablet; Take 1 tablet (100 mg total) by mouth daily. -     losartan (COZAAR) 100 MG tablet; TAKE 1 TABLET (100 MG TOTAL) BY MOUTH DAILY. -     glipiZIDE (GLUCOTROL XL) 10 MG 24 hr tablet; Take 1 tablet (10 mg total) by mouth daily. -     atenolol (TENORMIN) 100 MG tablet; TAKE 1 TABLET BY MOUTH TWICE A DAY -     amLODipine (NORVASC) 5 MG tablet; TAKE 1 TABLET (5 MG TOTAL) BY MOUTH DAILY. -     guaiFENesin-codeine (CHERATUSSIN AC) 100-10 MG/5ML syrup; Take 5 mLs by mouth 3 (three) times daily as needed.   Strongly encourage smoking cessation. Diabetic eye exam. Follow up with podiatrist as scheduled. Continue current medication regimen. Exercise 3-4 days a week. Recheck here in 4 months and sooner as needed.

## 2014-11-02 NOTE — Patient Instructions (Signed)
Diabetes and Standards of Medical Care Diabetes is complicated. You may find that your diabetes team includes a dietitian, nurse, diabetes educator, eye doctor, and more. To help everyone know what is going on and to help you get the care you deserve, the following schedule of care was developed to help keep you on track. Below are the tests, exams, vaccines, medicines, education, and plans you will need. HbA1c test This test shows how well you have controlled your glucose over the past 2-3 months. It is used to see if your diabetes management plan needs to be adjusted.   It is performed at least 2 times a year if you are meeting treatment goals.  It is performed 4 times a year if therapy has changed or if you are not meeting treatment goals. Blood pressure test  This test is performed at every routine medical visit. The goal is less than 140/90 mm Hg for most people, but 130/80 mm Hg in some cases. Ask your health care provider about your goal. Dental exam  Follow up with the dentist regularly. Eye exam  If you are diagnosed with type 1 diabetes as a child, get an exam upon reaching the age of 37 years or older and have had diabetes for 3-5 years. Yearly eye exams are recommended after that initial eye exam.  If you are diagnosed with type 1 diabetes as an adult, get an exam within 5 years of diagnosis and then yearly.  If you are diagnosed with type 2 diabetes, get an exam as soon as possible after the diagnosis and then yearly. Foot care exam  Visual foot exams are performed at every routine medical visit. The exams check for cuts, injuries, or other problems with the feet.  A comprehensive foot exam should be done yearly. This includes visual inspection as well as assessing foot pulses and testing for loss of sensation.  Check your feet nightly for cuts, injuries, or other problems with your feet. Tell your health care provider if anything is not healing. Kidney function test (urine  microalbumin)  This test is performed once a year.  Type 1 diabetes: The first test is performed 5 years after diagnosis.  Type 2 diabetes: The first test is performed at the time of diagnosis.  A serum creatinine and estimated glomerular filtration rate (eGFR) test is done once a year to assess the level of chronic kidney disease (CKD), if present. Lipid profile (cholesterol, HDL, LDL, triglycerides)  Performed every 5 years for most people.  The goal for LDL is less than 100 mg/dL. If you are at high risk, the goal is less than 70 mg/dL.  The goal for HDL is 40 mg/dL-50 mg/dL for men and 50 mg/dL-60 mg/dL for women. An HDL cholesterol of 60 mg/dL or higher gives some protection against heart disease.  The goal for triglycerides is less than 150 mg/dL. Influenza vaccine, pneumococcal vaccine, and hepatitis B vaccine  The influenza vaccine is recommended yearly.  It is recommended that people with diabetes who are over 24 years old get the pneumonia vaccine. In some cases, two separate shots may be given. Ask your health care provider if your pneumonia vaccination is up to date.  The hepatitis B vaccine is also recommended for adults with diabetes. Diabetes self-management education  Education is recommended at diagnosis and ongoing as needed. Treatment plan  Your treatment plan is reviewed at every medical visit. Document Released: 05/11/2009 Document Revised: 11/28/2013 Document Reviewed: 12/14/2012 [facility] Patient Information 2015 [place],  [employer]. This information is not intended to replace advice given to you by your health care provider. Make sure you discuss any questions you have with your health care provider.  

## 2015-01-01 ENCOUNTER — Ambulatory Visit: Payer: Medicare Other | Admitting: Podiatry

## 2015-01-25 ENCOUNTER — Other Ambulatory Visit: Payer: Self-pay | Admitting: Internal Medicine

## 2015-02-06 ENCOUNTER — Encounter: Payer: Self-pay | Admitting: Podiatry

## 2015-02-06 ENCOUNTER — Ambulatory Visit (INDEPENDENT_AMBULATORY_CARE_PROVIDER_SITE_OTHER): Payer: Medicare Other | Admitting: Podiatry

## 2015-02-06 DIAGNOSIS — M79676 Pain in unspecified toe(s): Secondary | ICD-10-CM

## 2015-02-06 DIAGNOSIS — B351 Tinea unguium: Secondary | ICD-10-CM | POA: Diagnosis not present

## 2015-02-06 NOTE — Progress Notes (Signed)
Patient ID: Anita Mccormick, female   DOB: [birth year], 69 y.o.   MRN: [id number]  Subjective: This patient presents for a scheduled visit complaining of painful toenails  Objective: The toenails are brittle, incurvated, discolored, hypertrophic and tender to direct palpation 6-10 Keratoses lateral nail groove fifth toes bilaterally  Assessment: Symptomatic onychomycoses 6-10 Listers corn fifth toes bilaterally Type II diabetic  Plan: Debridement of toenails mechanically and electrically without any bleeding Debrided keratoses 2 without a bleeding  Reappoint 3 months

## 2015-04-24 ENCOUNTER — Other Ambulatory Visit: Payer: Self-pay | Admitting: Family

## 2015-04-26 ENCOUNTER — Other Ambulatory Visit: Payer: Self-pay | Admitting: Family

## 2015-04-30 ENCOUNTER — Other Ambulatory Visit: Payer: Self-pay | Admitting: Family

## 2015-05-01 ENCOUNTER — Telehealth: Payer: Self-pay | Admitting: Family

## 2015-05-01 ENCOUNTER — Other Ambulatory Visit: Payer: Self-pay | Admitting: *Deleted

## 2015-05-01 MED ORDER — LOSARTAN POTASSIUM 100 MG PO TABS: ORAL_TABLET | ORAL | Status: DC

## 2015-05-01 NOTE — Telephone Encounter (Signed)
Rx sent in. Called and left voicemail to call office back to make patient aware.

## 2015-05-01 NOTE — Telephone Encounter (Signed)
Rx sent in. Called and left voicemail for patient to call back to make aware.

## 2015-05-01 NOTE — Telephone Encounter (Signed)
Need refill on losartan potassium 100

## 2015-05-09 ENCOUNTER — Ambulatory Visit: Payer: Medicare Other | Admitting: Podiatry

## 2015-05-13 ENCOUNTER — Other Ambulatory Visit: Payer: Self-pay | Admitting: Family

## 2015-05-15 ENCOUNTER — Other Ambulatory Visit: Payer: Self-pay | Admitting: Family

## 2015-05-21 ENCOUNTER — Ambulatory Visit (INDEPENDENT_AMBULATORY_CARE_PROVIDER_SITE_OTHER): Payer: Medicare Other | Admitting: Family Medicine

## 2015-05-21 ENCOUNTER — Encounter: Payer: Self-pay | Admitting: Family Medicine

## 2015-05-21 VITALS — BP 118/60 | HR 72 | Temp 98.2°F | Ht 62.0 in | Wt 159.5 lb

## 2015-05-21 DIAGNOSIS — I10 Essential (primary) hypertension: Secondary | ICD-10-CM

## 2015-05-21 DIAGNOSIS — Z720 Tobacco use: Secondary | ICD-10-CM

## 2015-05-21 DIAGNOSIS — E119 Type 2 diabetes mellitus without complications: Secondary | ICD-10-CM | POA: Diagnosis not present

## 2015-05-21 DIAGNOSIS — K760 Fatty (change of) liver, not elsewhere classified: Secondary | ICD-10-CM | POA: Diagnosis not present

## 2015-05-21 DIAGNOSIS — L309 Dermatitis, unspecified: Secondary | ICD-10-CM | POA: Insufficient documentation

## 2015-05-21 DIAGNOSIS — F17200 Nicotine dependence, unspecified, uncomplicated: Secondary | ICD-10-CM | POA: Insufficient documentation

## 2015-05-21 LAB — COMPREHENSIVE METABOLIC PANEL
ALK PHOS: 118 U/L — AB (ref 39–117)
ALT: 38 U/L — ABNORMAL HIGH (ref 0–35)
AST: 58 U/L — ABNORMAL HIGH (ref 0–37)
Albumin: 3.7 g/dL (ref 3.5–5.2)
BUN: 10 mg/dL (ref 6–23)
CO2: 27 mEq/L (ref 19–32)
Calcium: 10 mg/dL (ref 8.4–10.5)
Chloride: 99 mEq/L (ref 96–112)
Creatinine, Ser: 0.94 mg/dL (ref 0.40–1.20)
GFR: 76.03 mL/min (ref >60.00)
GLUCOSE: 267 mg/dL — AB (ref 70–99)
POTASSIUM: 4.1 meq/L (ref 3.5–5.1)
Sodium: 136 mEq/L (ref 135–145)
TOTAL PROTEIN: 7.5 g/dL (ref 6.0–8.3)
Total Bilirubin: 0.7 mg/dL (ref 0.2–1.2)

## 2015-05-21 LAB — HEMOGLOBIN A1C: HEMOGLOBIN A1C: 7.7 % — AB (ref 4.6–6.5)

## 2015-05-21 NOTE — Assessment & Plan Note (Signed)
S: controlled. On Amlodipine 5mg , atenolol 100mg  BID, losartan 100mg  A/P:Continue current meds

## 2015-05-21 NOTE — Assessment & Plan Note (Signed)
S: [place] c/w fatty liver in 2014. Hep C negative. Not immunized for Hep B on labs. Denies history Hep A immunization. Has lost 10 lbs over 4 years and LFTs have improved-  Lab Results  Component Value Date   ALT 31 11/02/2014   AST 57* 11/02/2014   ALKPHOS 104 11/02/2014   BILITOT 1.1 11/02/2014  A/P: check LFTs today, advised continued weight loss efforts, increase exercise

## 2015-05-21 NOTE — Progress Notes (Signed)
Pre visit review using our clinic review tool, if applicable. No additional management support is needed unless otherwise documented below in the visit note. 

## 2015-05-21 NOTE — Assessment & Plan Note (Addendum)
S: prior good control, worsening over last 1.5 years. Titrated up to Januvia 100mg  and glipizide 10mg  XL. Rare checks. No lows.  Lab Results  Component Value Date   HGBA1C 8.3* 11/02/2014  A/P: check a1c today, suspect titrate up glipizide if >8. If <8, lifestyle change only. Exercising once a week- advised at least 5x a week. Discussed diabetes education if 7-8 and cannot get below 7 with exercise changes

## 2015-05-21 NOTE — Patient Instructions (Addendum)
Sign release of information at the front desk for your eye doctor for your summer eye exam  Labs before you go. If a1c is below 8, we will plan on not adding medication. We did make a plan to start exercising 5 days a week for at least 30 minutes as this can reduce your need for another medicine. If a1c above 8, may need to increase medicine AND add exercise  Quit smoking- will reduce risk of having another stroke  Return in 3-4 months

## 2015-05-21 NOTE — Assessment & Plan Note (Signed)
S: discussed increased risk of cancer and stroke with smoking, advised cessation A/P: patient states had not thought about those risks- this increases her desire to stop the 1-2 cigarettes a day she is using

## 2015-05-21 NOTE — Progress Notes (Signed)
Garret Reddish, MD  Subjective:  Anita Mccormick is a 69 y.o. year old very pleasant female patient who presents for/with See problem oriented charting ROS- no chest pain, shortness of breath, headache, blurry vision  Past Medical History-  Patient Active Problem List   Diagnosis Date Noted  . Smoker 05/21/2015    Priority: High  . Fatty liver disease, nonalcoholic 98/92/1194    Priority: High  . History of CVA (cerebrovascular accident) 10/20/2007    Priority: High  . Diabetes mellitus type II, controlled ([place]) 12/10/2006    Priority: High  . BREAST CANCER, HX OF 12/10/2006    Priority: High  . Hyperlipidemia 11/03/2007    Priority: Medium  . Essential hypertension 12/10/2006    Priority: Medium  . Eczema 05/21/2015    Priority: Low   Medications- reviewed and updated Current Outpatient Prescriptions  Medication Sig Dispense Refill  . amLODipine (NORVASC) 5 MG tablet TAKE 1 TABLET (5 MG TOTAL) BY MOUTH DAILY. 90 tablet 1  . aspirin 325 MG tablet Take 325 mg by mouth daily.      [name] atenolol (TENORMIN) 100 MG tablet TAKE 1 TABLET BY MOUTH TWICE A DAY 180 tablet 1  . Calcium Carbonate-Vitamin D (CALCIUM PLUS VITAMIN D) 600-100 MG-UNIT CAPS Take by mouth 2 (two) times daily.      . fluocinonide cream (LIDEX) 0.05 %   2  . glipiZIDE (GLUCOTROL XL) 10 MG 24 hr tablet Take 1 tablet (10 mg total) by mouth daily. 90 tablet 1  . glucose blood (ONE TOUCH ULTRA TEST) test strip 1 each by Other route 2 (two) times daily. Use as instructed 100 each 3  . guaiFENesin-codeine (CHERATUSSIN AC) 100-10 MG/5ML syrup Take 5 mLs by mouth 3 (three) times daily as needed. 120 mL 0  . hydrOXYzine (ATARAX/VISTARIL) 10 MG tablet Take 10 mg by mouth as needed.   2  . JANUVIA 100 MG tablet TAKE 1 TABLET BY MOUTH EVERY DAY 90 tablet 1  . losartan (COZAAR) 100 MG tablet TAKE 1 TABLET (100 MG TOTAL) BY MOUTH DAILY. 90 tablet 0  . triamcinolone cream (KENALOG) 0.1 % as needed.     No current  facility-administered medications for this visit.    Objective: BP 118/60 mmHg  Pulse 72  Temp(Src) 98.2 F (36.8 C) (Oral)  Ht 5\' 2"  (1.575 m)  Wt 159 lb 8 oz (72.349 kg)  BMI 29.17 kg/m2  SpO2 92% Gen: NAD, resting comfortably CV: RRR no murmurs rubs or gallops Lungs: CTAB no crackles, wheeze, rhonchi Abdomen: soft/nontender/nondistended/normal bowel sounds. No rebound or guarding.  Ext: no edema Skin: warm, dry Neuro: grossly normal, moves all extremities  Assessment/Plan:  Diabetes mellitus type II, controlled ([place]) S: prior good control, worsening over last 1.5 years. Titrated up to Januvia 100mg  and glipizide 10mg  XL. Rare checks. No lows.  Lab Results  Component Value Date   HGBA1C 8.3* 11/02/2014  A/P: check a1c today, suspect titrate up glipizide if >8. If <8, lifestyle change only. Exercising once a week- advised at least 5x a week. Discussed diabetes education if 7-8 and cannot get below 7 with exercise changes   Smoker S: discussed increased risk of cancer and stroke with smoking, advised cessation A/P: patient states had not thought about those risks- this increases her desire to stop the 1-2 cigarettes a day she is using   Fatty liver disease, nonalcoholic S: [place] c/w fatty liver in 2014. Hep C negative. Not immunized for Hep B on labs. Denies  history Hep A immunization. Has lost 10 lbs over 4 years and LFTs have improved-  Lab Results  Component Value Date   ALT 31 11/02/2014   AST 57* 11/02/2014   ALKPHOS 104 11/02/2014   BILITOT 1.1 11/02/2014  A/P: check LFTs today, advised continued weight loss efforts, increase exercise   Essential hypertension S: controlled. On Amlodipine 5mg , atenolol 100mg  BID, losartan 100mg  A/P:Continue current meds   3-4 months  Orders Placed This Encounter  Procedures  . Hemoglobin A1c    [place]  . Comprehensive metabolic panel    [place]

## 2015-06-05 ENCOUNTER — Ambulatory Visit: Payer: Medicare Other | Admitting: Podiatry

## 2015-06-06 ENCOUNTER — Other Ambulatory Visit: Payer: Self-pay | Admitting: Family

## 2015-07-04 ENCOUNTER — Ambulatory Visit (INDEPENDENT_AMBULATORY_CARE_PROVIDER_SITE_OTHER): Payer: Medicare Other | Admitting: Podiatry

## 2015-07-04 ENCOUNTER — Encounter: Payer: Self-pay | Admitting: Podiatry

## 2015-07-04 DIAGNOSIS — M79676 Pain in unspecified toe(s): Secondary | ICD-10-CM | POA: Diagnosis not present

## 2015-07-04 DIAGNOSIS — B351 Tinea unguium: Secondary | ICD-10-CM | POA: Diagnosis not present

## 2015-07-04 NOTE — Patient Instructions (Signed)
Diabetes and Foot Care Diabetes may cause you to have problems because of poor blood supply (circulation) to your feet and legs. This may cause the skin on your feet to become thinner, break easier, and heal more slowly. Your skin may become dry, and the skin may peel and crack. You may also have nerve damage in your legs and feet causing decreased feeling in them. You may not notice minor injuries to your feet that could lead to infections or more serious problems. Taking care of your feet is one of the most important things you can do for yourself.  HOME CARE INSTRUCTIONS  Wear shoes at all times, even in the house. Do not go barefoot. Bare feet are easily injured.  Check your feet daily for blisters, cuts, and redness. If you cannot see the bottom of your feet, use a mirror or ask someone for help.  Wash your feet with warm water (do not use hot water) and mild soap. Then pat your feet and the areas between your toes until they are completely dry. Do not soak your feet as this can dry your skin.  Apply a moisturizing lotion or petroleum jelly (that does not contain alcohol and is unscented) to the skin on your feet and to dry, brittle toenails. Do not apply lotion between your toes.  Trim your toenails straight across. Do not dig under them or around the cuticle. File the edges of your nails with an emery board or nail file.  Do not cut corns or calluses or try to remove them with medicine.  Wear clean socks or stockings every day. Make sure they are not too tight. Do not wear knee-high stockings since they may decrease blood flow to your legs.  Wear shoes that fit properly and have enough cushioning. To break in new shoes, wear them for just a few hours a day. This prevents you from injuring your feet. Always look in your shoes before you put them on to be sure there are no objects inside.  Do not cross your legs. This may decrease the blood flow to your feet.  If you find a minor scrape,  cut, or break in the skin on your feet, keep it and the skin around it clean and dry. These areas may be cleansed with mild soap and water. Do not cleanse the area with peroxide, alcohol, or iodine.  When you remove an adhesive bandage, be sure not to damage the skin around it.  If you have a wound, look at it several times a day to make sure it is healing.  Do not use heating pads or hot water bottles. They may burn your skin. If you have lost feeling in your feet or legs, you may not know it is happening until it is too late.  Make sure your health care provider performs a complete foot exam at least annually or more often if you have foot problems. Report any cuts, sores, or bruises to your health care provider immediately. SEEK MEDICAL CARE IF:   You have an injury that is not healing.  You have cuts or breaks in the skin.  You have an ingrown nail.  You notice redness on your legs or feet.  You feel burning or tingling in your legs or feet.  You have pain or cramps in your legs and feet.  Your legs or feet are numb.  Your feet always feel cold. SEEK IMMEDIATE MEDICAL CARE IF:   There is increasing redness,   swelling, or pain in or around a wound.  There is a red line that goes up your leg.  Pus is coming from a wound.  You develop a fever or as directed by your health care provider.  You notice a bad smell coming from an ulcer or wound.   This information is not intended to replace advice given to you by your health care provider. Make sure you discuss any questions you have with your health care provider.   Document Released: 07/11/2000 Document Revised: 03/16/2013 Document Reviewed: 12/21/2012 Elsevier Interactive Patient Education 2016 [employer]  

## 2015-07-05 NOTE — Progress Notes (Signed)
Patient ID: Anita Mccormick, female   DOB: 07/16/1946, 69 y.o.   MRN: [id number]  Subjective: This patient presents again for scheduled visit complaining thick and long toenails which uncomfortable walking wearing shoes. Also, patient complaining of painful callus in the lateral fifth nail grooves bilaterally  Objective: No open skin lesions bilaterally The toenails are elongated, brittle, incurvated, deformed and tender direct palpation 6-10 Adductovarus rotated fifth toes with callus lateral nail grooves, bilaterally  Assessment: Type II diabetic Symptomatic onychomycoses 6-10 Listers corn is fifth toes bilaterally  Plan: Debridement toenails 10 mechanically electrically without any bleeding Debrided keratoses fifth toes bilaterally without any bleeding  Reappoint 3 months

## 2015-07-21 ENCOUNTER — Other Ambulatory Visit: Payer: Self-pay | Admitting: Family

## 2015-07-26 ENCOUNTER — Other Ambulatory Visit: Payer: Self-pay | Admitting: Family

## 2015-07-28 ENCOUNTER — Other Ambulatory Visit: Payer: Self-pay | Admitting: Family Medicine

## 2015-07-31 ENCOUNTER — Other Ambulatory Visit: Payer: Self-pay | Admitting: Family Medicine

## 2015-08-17 LAB — HM MAMMOGRAPHY

## 2015-08-20 ENCOUNTER — Encounter: Payer: Self-pay | Admitting: Family Medicine

## 2015-08-21 ENCOUNTER — Ambulatory Visit (INDEPENDENT_AMBULATORY_CARE_PROVIDER_SITE_OTHER): Payer: Medicare Other | Admitting: Family Medicine

## 2015-08-21 ENCOUNTER — Encounter: Payer: Self-pay | Admitting: Family Medicine

## 2015-08-21 VITALS — BP 110/64 | HR 70 | Temp 98.7°F | Wt 160.0 lb

## 2015-08-21 DIAGNOSIS — G4700 Insomnia, unspecified: Secondary | ICD-10-CM | POA: Insufficient documentation

## 2015-08-21 DIAGNOSIS — Z720 Tobacco use: Secondary | ICD-10-CM

## 2015-08-21 DIAGNOSIS — I10 Essential (primary) hypertension: Secondary | ICD-10-CM

## 2015-08-21 DIAGNOSIS — E119 Type 2 diabetes mellitus without complications: Secondary | ICD-10-CM | POA: Diagnosis not present

## 2015-08-21 DIAGNOSIS — D7589 Other specified diseases of blood and blood-forming organs: Secondary | ICD-10-CM

## 2015-08-21 DIAGNOSIS — F17200 Nicotine dependence, unspecified, uncomplicated: Secondary | ICD-10-CM

## 2015-08-21 DIAGNOSIS — Z23 Encounter for immunization: Secondary | ICD-10-CM

## 2015-08-21 DIAGNOSIS — K760 Fatty (change of) liver, not elsewhere classified: Secondary | ICD-10-CM

## 2015-08-21 LAB — CBC
HCT: 39.1 % (ref 36.0–46.0)
Hemoglobin: 13.1 g/dL (ref 12.0–15.0)
MCHC: 33.5 g/dL (ref 30.0–36.0)
MCV: 107.3 fl — AB (ref 78.0–100.0)
PLATELETS: 134 10*3/uL — AB (ref 150.0–400.0)
RBC: 3.65 Mil/uL — AB (ref 3.87–5.11)
RDW: 14.1 % (ref 11.5–15.5)
WBC: 4.3 10*3/uL (ref 4.0–10.5)

## 2015-08-21 LAB — COMPREHENSIVE METABOLIC PANEL
ALT: 34 U/L (ref 0–35)
AST: 62 U/L — AB (ref 0–37)
Albumin: 3.6 g/dL (ref 3.5–5.2)
Alkaline Phosphatase: 105 U/L (ref 39–117)
BILIRUBIN TOTAL: 0.7 mg/dL (ref 0.2–1.2)
BUN: 16 mg/dL (ref 6–23)
CHLORIDE: 99 meq/L (ref 96–112)
CO2: 25 meq/L (ref 19–32)
CREATININE: 1.11 mg/dL (ref 0.40–1.20)
Calcium: 9.9 mg/dL (ref 8.4–10.5)
GFR: 62.71 mL/min (ref >60.00)
GLUCOSE: 242 mg/dL — AB (ref 70–99)
Potassium: 4 mEq/L (ref 3.5–5.1)
SODIUM: 136 meq/L (ref 135–145)
Total Protein: 7.2 g/dL (ref 6.0–8.3)

## 2015-08-21 LAB — HEMOGLOBIN A1C: Hgb A1c MFr Bld: 8.9 % — ABNORMAL HIGH (ref 4.6–6.5)

## 2015-08-21 LAB — VITAMIN B12: VITAMIN B 12: 458 pg/mL (ref 211–911)

## 2015-08-21 MED ORDER — AMLODIPINE BESYLATE 5 MG PO TABS: ORAL_TABLET | ORAL | Status: DC

## 2015-08-21 MED ORDER — SITAGLIPTIN PHOSPHATE 100 MG PO TABS: 100.0000 mg | ORAL_TABLET | Freq: Every day | ORAL | Status: DC

## 2015-08-21 MED ORDER — TRAZODONE HCL 50 MG PO TABS: 25.0000 mg | ORAL_TABLET | Freq: Every evening | ORAL | Status: DC | PRN

## 2015-08-21 MED ORDER — ATENOLOL 100 MG PO TABS: 100.0000 mg | ORAL_TABLET | Freq: Two times a day (BID) | ORAL | Status: DC

## 2015-08-21 NOTE — Progress Notes (Signed)
Anita Reddish, MD  Subjective:  Anita Mccormick is a 70 y.o. year old very pleasant female patient who presents for/with See problem oriented charting ROS- No chest pain or shortness of breath. No headache or blurry vision. No hypoglycemia.   Past Medical History-  Patient Active Problem List   Diagnosis Date Noted  . Smoker 05/21/2015    Priority: High  . Fatty liver disease, nonalcoholic 123456    Priority: High  . History of CVA (cerebrovascular accident) 10/20/2007    Priority: High  . Diabetes mellitus type II, controlled ([place]) 12/10/2006    Priority: High  . BREAST CANCER, HX OF 12/10/2006    Priority: High  . Hyperlipidemia 11/03/2007    Priority: Medium  . Essential hypertension 12/10/2006    Priority: Medium  . Eczema 05/21/2015    Priority: Low  . Insomnia 08/21/2015    Medications- reviewed and updated Current Outpatient Prescriptions  Medication Sig Dispense Refill  . amLODipine (NORVASC) 5 MG tablet TAKE 1 TABLET (5 MG TOTAL) BY MOUTH DAILY. 90 tablet 3  . aspirin 325 MG tablet Take 325 mg by mouth daily.      [name] atenolol (TENORMIN) 100 MG tablet Take 1 tablet (100 mg total) by mouth 2 (two) times daily. 180 tablet 3  . Calcium Carbonate-Vitamin D (CALCIUM PLUS VITAMIN D) 600-100 MG-UNIT CAPS Take by mouth 2 (two) times daily.      . fluocinonide cream (LIDEX) 0.05 %   2  . glipiZIDE (GLUCOTROL XL) 10 MG 24 hr tablet TAKE 1 TABLET BY MOUTH EVERY DAY 90 tablet 1  . glucose blood (ONE TOUCH ULTRA TEST) test strip 1 each by Other route 2 (two) times daily. Use as instructed 100 each 3  . hydrOXYzine (ATARAX/VISTARIL) 10 MG tablet Take 10 mg by mouth as needed.   2  . losartan (COZAAR) 100 MG tablet TAKE 1 TABLET (100 MG TOTAL) BY MOUTH DAILY. 90 tablet 3  . sitaGLIPtin (JANUVIA) 100 MG tablet Take 1 tablet (100 mg total) by mouth daily. 90 tablet 3  . triamcinolone cream (KENALOG) 0.1 % as needed.    . traZODone (DESYREL) 50 MG tablet Take 0.5-1 tablets (25-50  mg total) by mouth at bedtime as needed for sleep. 30 tablet 3   No current facility-administered medications for this visit.    Objective: BP 110/64 mmHg  Pulse 70  Temp(Src) 98.7 F (37.1 C)  Wt 160 lb (72.576 kg) Gen: NAD, resting comfortably CV: RRR no murmurs rubs or gallops Lungs: CTAB no crackles, wheeze, rhonchi Abdomen: soft/nontender/nondistended/normal bowel sounds. No rebound or guarding.  Ext: no edema Skin: warm, dry Neuro: grossly normal, moves all extremities  Assessment/Plan:  Diabetes mellitus type II, controlled ([place]) S: poorly controlled. On januvia 100mg  and glipizide 10mg  XL previously but had improving control CBGs- denies hypoglycemia Exercise and diet- increased from once a week to 2-3x a week  Lab Results  Component Value Date   HGBA1C 7.7* 05/21/2015   HGBA1C 8.3* 11/02/2014   HGBA1C 9.0* 07/14/2014   A/P: hopeful trend continues to improve. Discussed if >8 increase glipizide to BID and continue [place]. If 7.5-8 add diabetes education. If 7-7.5 continue current course and just try to bump exercise.    Smoker Advised complete cessation. Patient not ready.   Fatty liver disease, nonalcoholic S: last LFTs showed mild worsening. Has gained 1 lb over last 7 months. execise improved but lacking. Not makign best dietary choices A/P: Encouraged need for healthy eating, regular exercise,  weight loss. Plan weight in 130-140 range long run. Check LFTs= if ast/alt progress to >100 then consider deeper investigation and vaccinations.    Essential hypertension S: controlled. On Amlodipine 5mg , atenolol 100mg  BID, losartan 100mg   BP Readings from Last 3 Encounters:  08/21/15 110/64  05/21/15 118/60  11/02/14 102/62  A/P:Continue current meds:  No change   Insomnia S: patient has been having trouble sleeping for several months. She has been having an alcoholic beverage before bed which helps her get to sleep but does not have restful sleep.  A/P: trial  trazodone 25-50mg  instead- advised no alcohol before bed  for macrocytosis- also check b12, folate, cbc  Return in about 4 months (around 12/19/2015). Return precautions advised.   Orders Placed This Encounter  Procedures  . Pneumococcal polysaccharide vaccine 23-valent greater than or equal to 2yo subcutaneous/IM  . Comprehensive metabolic panel    [place]  . Hemoglobin A1c    [place]  . Vitamin B12  . Folate RBC  . CBC    [place]    Meds ordered this encounter  Medications  . traZODone (DESYREL) 50 MG tablet    Sig: Take 0.5-1 tablets (25-50 mg total) by mouth at bedtime as needed for sleep.    Dispense:  30 tablet    Refill:  3  . atenolol (TENORMIN) 100 MG tablet    Sig: Take 1 tablet (100 mg total) by mouth 2 (two) times daily.    Dispense:  180 tablet    Refill:  3  . amLODipine (NORVASC) 5 MG tablet    Sig: TAKE 1 TABLET (5 MG TOTAL) BY MOUTH DAILY.    Dispense:  90 tablet    Refill:  3  . sitaGLIPtin (JANUVIA) 100 MG tablet    Sig: Take 1 tablet (100 mg total) by mouth daily.    Dispense:  90 tablet    Refill:  3

## 2015-08-21 NOTE — Assessment & Plan Note (Signed)
Advised complete cessation. Patient not ready.

## 2015-08-21 NOTE — Assessment & Plan Note (Signed)
S: poorly controlled. On januvia 100mg  and glipizide 10mg  XL previously but had improving control CBGs- denies hypoglycemia Exercise and diet- increased from once a week to 2-3x a week  Lab Results  Component Value Date   HGBA1C 7.7* 05/21/2015   HGBA1C 8.3* 11/02/2014   HGBA1C 9.0* 07/14/2014   A/P: hopeful trend continues to improve. Discussed if >8 increase glipizide to BID and continue [place]. If 7.5-8 add diabetes education. If 7-7.5 continue current course and just try to bump exercise.

## 2015-08-21 NOTE — Assessment & Plan Note (Signed)
S: last LFTs showed mild worsening. Has gained 1 lb over last 7 months. execise improved but lacking. Not makign best dietary choices A/P: Encouraged need for healthy eating, regular exercise, weight loss. Plan weight in 130-140 range long run. Check LFTs= if ast/alt progress to >100 then consider deeper investigation and vaccinations.

## 2015-08-21 NOTE — Patient Instructions (Addendum)
Final pneumonia shot received today Pneumovax23.  Get eye exam scheduled and have record faxed to [place] at [phone].  Meds ordered this encounter  Medications  . traZODone (DESYREL) 50 MG tablet NEW MEDICINE FOR SLEEP    Sig: Take 0.5-1 tablets (25-50 mg total) by mouth at bedtime as needed for sleep.    Dispense:  30 tablet    Refill:  3  . atenolol (TENORMIN) 100 MG tablet    Sig: Take 1 tablet (100 mg total) by mouth 2 (two) times daily.    Dispense:  180 tablet    Refill:  3  . amLODipine (NORVASC) 5 MG tablet    Sig: TAKE 1 TABLET (5 MG TOTAL) BY MOUTH DAILY.    Dispense:  90 tablet    Refill:  3  . sitaGLIPtin (JANUVIA) 100 MG tablet    Sig: Take 1 tablet (100 mg total) by mouth daily.    Dispense:  90 tablet    Refill:  3   Quit smoking! Best thing you can do for your health  Update Labs  Work on getting weight down to 140 over next year. Bump up exercise if you can. If a1c above 7.5- refer to diabetes education

## 2015-08-21 NOTE — Assessment & Plan Note (Signed)
S: patient has been having trouble sleeping for several months. She has been having an alcoholic beverage before bed which helps her get to sleep but does not have restful sleep.  A/P: trial trazodone 25-50mg  instead- advised no alcohol before bed

## 2015-08-21 NOTE — Assessment & Plan Note (Signed)
S: controlled. On Amlodipine 5mg , atenolol 100mg  BID, losartan 100mg   BP Readings from Last 3 Encounters:  08/21/15 110/64  05/21/15 118/60  11/02/14 102/62  A/P:Continue current meds:  No change

## 2015-08-22 ENCOUNTER — Other Ambulatory Visit: Payer: Self-pay

## 2015-08-22 DIAGNOSIS — E119 Type 2 diabetes mellitus without complications: Secondary | ICD-10-CM

## 2015-08-22 LAB — FOLATE RBC: RBC Folate: 457 ng/mL (ref >280)

## 2015-08-22 MED ORDER — GLIPIZIDE ER 10 MG PO TB24: 10.0000 mg | ORAL_TABLET | Freq: Two times a day (BID) | ORAL | Status: DC

## 2015-09-14 ENCOUNTER — Encounter: Payer: Medicare Other | Attending: Family Medicine | Admitting: Dietician

## 2015-09-14 ENCOUNTER — Encounter: Payer: Self-pay | Admitting: Dietician

## 2015-09-14 VITALS — Ht 62.0 in | Wt 160.0 lb

## 2015-09-14 DIAGNOSIS — E119 Type 2 diabetes mellitus without complications: Secondary | ICD-10-CM | POA: Insufficient documentation

## 2015-09-14 DIAGNOSIS — E118 Type 2 diabetes mellitus with unspecified complications: Secondary | ICD-10-CM

## 2015-09-14 NOTE — Patient Instructions (Signed)
Plan:  Avoid snacking in front of the TV and if not hungry.  Reduce fat intake.  Choose whole wheat English Muffins or bread rather than Croissants or biscuits. Resume the exercise habit.  Start with 5 minutes a couple times per day and increase slowly to goal of 30 minutes most days. Aim for 2-3 Carb Choices per meal (30-45 grams) +/- 1 either way  Aim for 0-1 Carbs per snack if hungry  Include protein in moderation with your meals and snacks Consider reading food labels for Total Carbohydrate and Fat Grams of foods Consider checking BG at alternate times per day as directed by MD  Consider taking medication as directed by MD

## 2015-09-14 NOTE — Progress Notes (Signed)
Diabetes Self-Management Education  Visit Type: First/Initial  Appt. Start Time: 1115 Appt. End Time: 1215 Late appointment due to epic not arriving patient.  09/14/2015  [name], identified by name and date of birth, is a 70 y.o. female with a diagnosis of Diabetes: Type 2.   Primary concerns today: Patient is here  alone.  She has been referred for type 2 diabetes which she has had about 10 years.  She has never seen an RD. Hx includes CVA, HTN, h/o breast cancer with prosthesis (1980), smokes, sleep problems which is improved currently.    HgbA1C 8.9% 08/21/15 which has increased from 7.7% 05/21/15.  GFR 62.7.  160 lbs currently down from 170 lbs 7 months ago.    Patient lives with her husband and son.  She does the shopping and cooking.  ASSESSMENT  Height 5\' 2"  (1.575 m), weight 160 lb (72.576 kg). Body mass index is 29.26 kg/(m^2).      Diabetes Self-Management Education - 09/14/15 1134    Visit Information   Visit Type First/Initial   Initial Visit   Diabetes Type Type 2   Are you currently following a meal plan? Yes   What type of meal plan do you follow? modified carbohydrate   Are you taking your medications as prescribed? Yes   Date Diagnosed around 10 years ago   Health Coping   How would you rate your overall health? Good   Psychosocial Assessment   Patient Belief/Attitude about Diabetes Motivated to manage diabetes   Self-care barriers None   Self-management support Doctor's office;Family   Other persons present Patient   Patient Concerns Glycemic Control;Nutrition/Meal planning;Weight Control   Special Needs None   Preferred Learning Style No preference indicated   Learning Readiness Ready   How often do you need to have someone help you when you read instructions, pamphlets, or other written materials from your doctor or pharmacy? 1 - Never   What is the last grade level you completed in school? [id number] grade   Complications   Last HgB A1C per  patient/outside source 8.9 %  08/01/15 increased from 7.7% 08/20/14   How often do you check your blood sugar? 0 times/day (not testing)  not checking blood sugar regularly   Have you had a dilated eye exam in the past 12 months? Yes   Have you had a dental exam in the past 12 months? Yes   Are you checking your feet? Yes   How many days per week are you checking your feet? 1   Dietary Intake   Breakfast decaf coffee with splenda and non dairy creamer, crousant, egg, cheese, and bacon sandwich from [employer]  10   Snack (morning) none   Lunch salad without protein or italina sausage in lite bread  1:00   Snack (afternoon) none   Dinner cube steak, rice, gravy, and peas  5:00   Snack (evening) doritoes, potatoe chips even when not hungry   Beverage(s) diet caffeine free pepsi or diet coke (12 ounce), water, coffee with splenda and non dairy creamer   Exercise   Exercise Type ADL's  has a treadmill   Patient Education   Previous Diabetes Education No   Disease state  Definition of diabetes, type 1 and 2, and the diagnosis of diabetes   Nutrition management  Role of diet in the treatment of diabetes and the relationship between the three main macronutrients and blood glucose level;Food label reading, portion sizes and measuring food.;Information on  hints to eating out and maintain blood glucose control.;Meal options for control of blood glucose level and chronic complications.   Physical activity and exercise  Role of exercise on diabetes management, blood pressure control and cardiac health.;Helped patient identify appropriate exercises in relation to his/her diabetes, diabetes complications and other health issue.   Monitoring Purpose and frequency of SMBG.;Identified appropriate SMBG and/or A1C goals.;Yearly dilated eye exam;Daily foot exams   Chronic complications Assessed and discussed foot care and prevention of foot problems;Retinopathy and reason for yearly dilated eye  exams;Relationship between chronic complications and blood glucose control;Dental care   Psychosocial adjustment Worked with patient to identify barriers to care and solutions;Role of stress on diabetes;Identified and addressed patients feelings and concerns about diabetes   Individualized Goals (developed by patient)   Nutrition General guidelines for healthy choices and portions discussed   Physical Activity Exercise 5-7 days per week;15 minutes per day   Medications take my medication as prescribed   Monitoring  test my blood glucose as discussed   Reducing Risk stop smoking   Outcomes   Expected Outcomes Demonstrated interest in learning. Expect positive outcomes   Future DMSE PRN   Program Status Completed      Individualized Plan for Diabetes Self-Management Training:   Learning Objective:  Patient will have a greater understanding of diabetes self-management. Patient education plan is to attend individual and/or group sessions per assessed needs and concerns.   Plan:   Patient Instructions  Plan:  Avoid snacking in front of the TV and if not hungry.  Reduce fat intake.  Choose whole wheat English Muffins or bread rather than Croissants or biscuits. Resume the exercise habit.  Start with 5 minutes a couple times per day and increase slowly to goal of 30 minutes most days. Aim for 2-3 Carb Choices per meal (30-45 grams) +/- 1 either way  Aim for 0-1 Carbs per snack if hungry  Include protein in moderation with your meals and snacks Consider reading food labels for Total Carbohydrate and Fat Grams of foods Consider checking BG at alternate times per day as directed by MD  Consider taking medication as directed by MD    Expected Outcomes:  Demonstrated interest in learning. Expect positive outcomes  Education material provided: Living Well with Diabetes, Food label handouts, A1C conversion sheet, Meal plan card, My Plate and Snack sheet  If problems or questions, patient to  contact team via:  Phone and Email  Future DSME appointment: PRN

## 2015-09-26 ENCOUNTER — Telehealth: Payer: Self-pay | Admitting: Family Medicine

## 2015-09-26 NOTE — Telephone Encounter (Signed)
Pt has deep cough, runny nose, would like an appointment thurs or Friday with Dr Yong Channel. Ok to work in?

## 2015-09-26 NOTE — Telephone Encounter (Signed)
yes

## 2015-09-27 NOTE — Telephone Encounter (Signed)
Pt has been scheduled.  °

## 2015-09-28 ENCOUNTER — Ambulatory Visit (INDEPENDENT_AMBULATORY_CARE_PROVIDER_SITE_OTHER): Payer: Medicare Other | Admitting: Family Medicine

## 2015-09-28 ENCOUNTER — Encounter: Payer: Self-pay | Admitting: Family Medicine

## 2015-09-28 VITALS — BP 118/60 | HR 78 | Temp 99.2°F | Wt 161.0 lb

## 2015-09-28 DIAGNOSIS — J069 Acute upper respiratory infection, unspecified: Secondary | ICD-10-CM

## 2015-09-28 MED ORDER — GUAIFENESIN-CODEINE 100-10 MG/5ML PO SOLN: 5.0000 mL | Freq: Four times a day (QID) | ORAL | Status: DC | PRN

## 2015-09-28 NOTE — Patient Instructions (Signed)
Upper Respiratory infection History and exam today are suggestive of viral URI.  We discussed that a serious infection or illness is unlikely. We also discussed reasons why current illness does not meet criteria for bacterial illness and therefore no antibiotics indicated. Also educated on signs that bacterial infection may have developed (fever, shortness of breath, sinus pressure, symptoms longer than another week). We discussed treatment side effects, likely course, antibiotic misuse, transmission, and signs of developing a serious illness.  Symptomatic treatment with: codeine cough syrup. Hold off on mucinex dm while taking. Do not drive for 8 hours after codeine cough syrup  Finally, we reviewed reasons to return to care including if symptoms worsen or persist or new concerns arise.  Meds ordered this encounter  Medications  . guaiFENesin-codeine 100-10 MG/5ML syrup    Sig: Take 5 mLs by mouth every 6 (six) hours as needed for cough.    Dispense:  180 mL    Refill:  0

## 2015-09-28 NOTE — Progress Notes (Signed)
PCP: Garret Reddish, MD  Subjective:  Anita Mccormick is a 70 y.o. year old very pleasant female patient who presents with Upper Respiratory infection symptoms including nasal congestion, runny nose, dry cough which feels deep. Sometimes coughs until her back gets sore. Has responded well in past with codeine cough syrup -started: 1 week ago - symptoms show no change -previous treatments: mucinex DM.  -sick contacts/travel/risks: denies flu exposure.   ROS-denies fever, SOB, NVD, tooth pain  Pertinent Past Medical History- smoker 1-2 cigarrettes per day, history of CVA, diabetes  Poor control last check, history breast cancer, HTN, HLD  Medications- reviewed  Current Outpatient Prescriptions  Medication Sig Dispense Refill  . amLODipine (NORVASC) 5 MG tablet TAKE 1 TABLET (5 MG TOTAL) BY MOUTH DAILY. 90 tablet 3  . aspirin 325 MG tablet Take 325 mg by mouth daily.      [name] atenolol (TENORMIN) 100 MG tablet Take 1 tablet (100 mg total) by mouth 2 (two) times daily. 180 tablet 3  . Calcium Carbonate-Vitamin D (CALCIUM PLUS VITAMIN D) 600-100 MG-UNIT CAPS Take by mouth 2 (two) times daily.      [name] glipiZIDE (GLUCOTROL XL) 10 MG 24 hr tablet Take 1 tablet (10 mg total) by mouth 2 (two) times daily. 90 tablet 1  . glucose blood (ONE TOUCH ULTRA TEST) test strip 1 each by Other route 2 (two) times daily. Use as instructed 100 each 3  . hydrOXYzine (ATARAX/VISTARIL) 10 MG tablet Take 10 mg by mouth as needed.   2  . losartan (COZAAR) 100 MG tablet TAKE 1 TABLET (100 MG TOTAL) BY MOUTH DAILY. 90 tablet 3  . sitaGLIPtin (JANUVIA) 100 MG tablet Take 1 tablet (100 mg total) by mouth daily. 90 tablet 3  . fluocinonide cream (LIDEX) 0.05 % Reported on 09/28/2015  2  . traZODone (DESYREL) 50 MG tablet Take 0.5-1 tablets (25-50 mg total) by mouth at bedtime as needed for sleep. (Patient not taking: Reported on 09/14/2015) 30 tablet 3  . triamcinolone cream (KENALOG) 0.1 % as needed. Reported on 09/28/2015      Objective: BP 118/60 mmHg  Pulse 78  Temp(Src) 99.2 F (37.3 C)  Wt 161 lb (73.029 kg)  SpO2 94% Gen: NAD, resting comfortably, intermittent cough, well appearing otherwise HEENT: Turbinates erythematous with clear rhinorrhea, TM normal, pharynx mildly erythematous with no tonsilar exudate or edema, no sinus tenderness CV: RRR no murmurs rubs or gallops Lungs: CTAB no crackles, wheeze, rhonchi Abdomen: soft/nontender/nondistended/normal bowel sounds. No rebound or guarding.  Ext: no edema Skin: warm, dry, no rash Neuro: grossly normal, moves all extremities  Assessment/Plan:  Upper Respiratory infection History and exam today are suggestive of viral URI.  We discussed that a serious infection or illness is unlikely. We also discussed reasons why current illness does not meet criteria for bacterial illness and therefore no antibiotics indicated. Also educated on signs that bacterial infection may have developed (fever, shortness of breath, sinus pressure, symptoms longer than another week). We discussed treatment side effects, likely course, antibiotic misuse, transmission, and signs of developing a serious illness.  Symptomatic treatment with: codeine cough syrup. Hold off on mucinex dm while taking. Do not drive for 8 hours after codeine cough syrup  Strongly advised quitting smoking as well  Finally, we reviewed reasons to return to care including if symptoms worsen or persist or new concerns arise.  Meds ordered this encounter  Medications  . guaiFENesin-codeine 100-10 MG/5ML syrup    Sig: Take 5 mLs  by mouth every 6 (six) hours as needed for cough.    Dispense:  180 mL    Refill:  0

## 2015-10-03 ENCOUNTER — Encounter: Payer: Self-pay | Admitting: Podiatry

## 2015-10-03 ENCOUNTER — Ambulatory Visit (INDEPENDENT_AMBULATORY_CARE_PROVIDER_SITE_OTHER): Payer: Medicare Other | Admitting: Podiatry

## 2015-10-03 DIAGNOSIS — M79676 Pain in unspecified toe(s): Secondary | ICD-10-CM | POA: Diagnosis not present

## 2015-10-03 DIAGNOSIS — B351 Tinea unguium: Secondary | ICD-10-CM

## 2015-10-03 NOTE — Patient Instructions (Signed)
Diabetes and Foot Care Diabetes may cause you to have problems because of poor blood supply (circulation) to your feet and legs. This may cause the skin on your feet to become thinner, break easier, and heal more slowly. Your skin may become dry, and the skin may peel and crack. You may also have nerve damage in your legs and feet causing decreased feeling in them. You may not notice minor injuries to your feet that could lead to infections or more serious problems. Taking care of your feet is one of the most important things you can do for yourself.  HOME CARE INSTRUCTIONS  Wear shoes at all times, even in the house. Do not go barefoot. Bare feet are easily injured.  Check your feet daily for blisters, cuts, and redness. If you cannot see the bottom of your feet, use a mirror or ask someone for help.  Wash your feet with warm water (do not use hot water) and mild soap. Then pat your feet and the areas between your toes until they are completely dry. Do not soak your feet as this can dry your skin.  Apply a moisturizing lotion or petroleum jelly (that does not contain alcohol and is unscented) to the skin on your feet and to dry, brittle toenails. Do not apply lotion between your toes.  Trim your toenails straight across. Do not dig under them or around the cuticle. File the edges of your nails with an emery board or nail file.  Do not cut corns or calluses or try to remove them with medicine.  Wear clean socks or stockings every day. Make sure they are not too tight. Do not wear knee-high stockings since they may decrease blood flow to your legs.  Wear shoes that fit properly and have enough cushioning. To break in new shoes, wear them for just a few hours a day. This prevents you from injuring your feet. Always look in your shoes before you put them on to be sure there are no objects inside.  Do not cross your legs. This may decrease the blood flow to your feet.  If you find a minor scrape,  cut, or break in the skin on your feet, keep it and the skin around it clean and dry. These areas may be cleansed with mild soap and water. Do not cleanse the area with peroxide, alcohol, or iodine.  When you remove an adhesive bandage, be sure not to damage the skin around it.  If you have a wound, look at it several times a day to make sure it is healing.  Do not use heating pads or hot water bottles. They may burn your skin. If you have lost feeling in your feet or legs, you may not know it is happening until it is too late.  Make sure your health care provider performs a complete foot exam at least annually or more often if you have foot problems. Report any cuts, sores, or bruises to your health care provider immediately. SEEK MEDICAL CARE IF:   You have an injury that is not healing.  You have cuts or breaks in the skin.  You have an ingrown nail.  You notice redness on your legs or feet.  You feel burning or tingling in your legs or feet.  You have pain or cramps in your legs and feet.  Your legs or feet are numb.  Your feet always feel cold. SEEK IMMEDIATE MEDICAL CARE IF:   There is increasing redness,   swelling, or pain in or around a wound.  There is a red line that goes up your leg.  Pus is coming from a wound.  You develop a fever or as directed by your health care provider.  You notice a bad smell coming from an ulcer or wound.   This information is not intended to replace advice given to you by your health care provider. Make sure you discuss any questions you have with your health care provider.   Document Released: 07/11/2000 Document Revised: 03/16/2013 Document Reviewed: 12/21/2012 Elsevier Interactive Patient Education 2016 [employer]  

## 2015-10-04 NOTE — Progress Notes (Signed)
Patient ID: Anita Mccormick, female   DOB: 09-02-1945, 70 y.o.   MRN: [id number]  Subjective: This patient presents again for scheduled visit complaining thick and long toenails which uncomfortable walking wearing shoes. Also, patient complaining of painful callus in the lateral fifth nail grooves bilaterally  Objective: No open skin lesions bilaterally The toenails are elongated, brittle, incurvated, deformed and tender direct palpation 6-10 Adductovarus rotated fifth toes with callus lateral nail grooves, bilaterally  Assessment: Type II diabetic Symptomatic onychomycoses 6-10 Listers corn is fifth toes bilaterally  Plan: Debridement toenails 10 mechanically electrically without any bleeding Debrided keratoses fifth toes bilaterally without any bleeding  Reappoint 3 months

## 2015-11-26 ENCOUNTER — Ambulatory Visit: Payer: Self-pay | Admitting: Family Medicine

## 2015-12-19 ENCOUNTER — Encounter: Payer: Self-pay | Admitting: Family Medicine

## 2015-12-19 ENCOUNTER — Ambulatory Visit (INDEPENDENT_AMBULATORY_CARE_PROVIDER_SITE_OTHER): Payer: Medicare Other | Admitting: Family Medicine

## 2015-12-19 VITALS — BP 120/70 | HR 70 | Temp 97.8°F | Wt 158.0 lb

## 2015-12-19 DIAGNOSIS — I10 Essential (primary) hypertension: Secondary | ICD-10-CM

## 2015-12-19 DIAGNOSIS — F17200 Nicotine dependence, unspecified, uncomplicated: Secondary | ICD-10-CM

## 2015-12-19 DIAGNOSIS — K760 Fatty (change of) liver, not elsewhere classified: Secondary | ICD-10-CM

## 2015-12-19 DIAGNOSIS — D696 Thrombocytopenia, unspecified: Secondary | ICD-10-CM

## 2015-12-19 DIAGNOSIS — E119 Type 2 diabetes mellitus without complications: Secondary | ICD-10-CM | POA: Diagnosis not present

## 2015-12-19 DIAGNOSIS — Z720 Tobacco use: Secondary | ICD-10-CM

## 2015-12-19 LAB — COMPREHENSIVE METABOLIC PANEL
ALBUMIN: 3.6 g/dL (ref 3.5–5.2)
ALK PHOS: 127 U/L — AB (ref 39–117)
ALT: 31 U/L (ref 0–35)
AST: 66 U/L — ABNORMAL HIGH (ref 0–37)
BILIRUBIN TOTAL: 0.7 mg/dL (ref 0.2–1.2)
BUN: 9 mg/dL (ref 6–23)
CALCIUM: 9.5 mg/dL (ref 8.4–10.5)
CO2: 27 mEq/L (ref 19–32)
Chloride: 103 mEq/L (ref 96–112)
Creatinine, Ser: 0.82 mg/dL (ref 0.40–1.20)
GFR: 88.86 mL/min (ref >60.00)
Glucose, Bld: 237 mg/dL — ABNORMAL HIGH (ref 70–99)
Potassium: 4.6 mEq/L (ref 3.5–5.1)
Sodium: 139 mEq/L (ref 135–145)
TOTAL PROTEIN: 6.7 g/dL (ref 6.0–8.3)

## 2015-12-19 LAB — LIPID PANEL
CHOLESTEROL: 160 mg/dL (ref 0–200)
HDL: 56.9 mg/dL (ref >39.00)
LDL Cholesterol: 82 mg/dL (ref 0–99)
NonHDL: 103.09
TRIGLYCERIDES: 107 mg/dL (ref 0.0–149.0)
Total CHOL/HDL Ratio: 3
VLDL: 21.4 mg/dL (ref 0.0–40.0)

## 2015-12-19 LAB — CBC
HCT: 38.7 % (ref 36.0–46.0)
HEMOGLOBIN: 13.1 g/dL (ref 12.0–15.0)
MCHC: 33.9 g/dL (ref 30.0–36.0)
MCV: 106.3 fl — ABNORMAL HIGH (ref 78.0–100.0)
PLATELETS: 137 10*3/uL — AB (ref 150.0–400.0)
RBC: 3.64 Mil/uL — ABNORMAL LOW (ref 3.87–5.11)
RDW: 13.8 % (ref 11.5–15.5)
WBC: 4.7 10*3/uL (ref 4.0–10.5)

## 2015-12-19 LAB — HEMOGLOBIN A1C: Hgb A1c MFr Bld: 8.1 % — ABNORMAL HIGH (ref 4.6–6.5)

## 2015-12-19 NOTE — Assessment & Plan Note (Signed)
Smoker- not ready to quit now. Son's significant other left the family with 3 kids 3,4,5 and having to help grandkids. She had been considering quitting before this change.

## 2015-12-19 NOTE — Assessment & Plan Note (Signed)
S: controlled. On amlodipine 5mg , atenolol 100mg  BID, losartan 100mg   BP Readings from Last 3 Encounters:  12/19/15 120/70  09/28/15 118/60  08/21/15 110/64  A/P:Continue current medications

## 2015-12-19 NOTE — Assessment & Plan Note (Signed)
S: patient has lost 3 lbs- goal weight still 130-140 and far off from that. Her AST is up slightly and alk phos is up slightly as well A/P: continue to stress need for weight loss and follow up each visit- already ordered for next visit

## 2015-12-19 NOTE — Progress Notes (Signed)
Subjective:  Anita Mccormick is a 70 y.o. year old very pleasant female patient who presents for/with See problem oriented charting ROS- No chest pain or shortness of breath. No headache or blurry vision. .see any ROS included in HPI as well.   Past Medical History-  Patient Active Problem List   Diagnosis Date Noted  . Smoker 05/21/2015    Priority: High  . Fatty liver disease, nonalcoholic 27/78/2423    Priority: High  . History of CVA (cerebrovascular accident) 10/20/2007    Priority: High  . Diabetes mellitus type II, controlled ([place]) 12/10/2006    Priority: High  . BREAST CANCER, HX OF 12/10/2006    Priority: High  . Thrombocytopenia ([place]) 12/19/2015    Priority: Medium  . Insomnia 08/21/2015    Priority: Medium  . Hyperlipidemia 11/03/2007    Priority: Medium  . Essential hypertension 12/10/2006    Priority: Medium  . Eczema 05/21/2015    Priority: Low    Medications- reviewed and updated Current Outpatient Prescriptions  Medication Sig Dispense Refill  . amLODipine (NORVASC) 5 MG tablet TAKE 1 TABLET (5 MG TOTAL) BY MOUTH DAILY. 90 tablet 3  . aspirin 325 MG tablet Take 325 mg by mouth daily.      [name] atenolol (TENORMIN) 100 MG tablet Take 1 tablet (100 mg total) by mouth 2 (two) times daily. 180 tablet 3  . Calcium Carbonate-Vitamin D (CALCIUM PLUS VITAMIN D) 600-100 MG-UNIT CAPS Take by mouth 2 (two) times daily.      . fluocinonide cream (LIDEX) 0.05 % Reported on 09/28/2015  2  . glucose blood (ONE TOUCH ULTRA TEST) test strip 1 each by Other route 2 (two) times daily. Use as instructed 100 each 3  . losartan (COZAAR) 100 MG tablet TAKE 1 TABLET (100 MG TOTAL) BY MOUTH DAILY. 90 tablet 3  . sitaGLIPtin (JANUVIA) 100 MG tablet Take 1 tablet (100 mg total) by mouth daily. 90 tablet 3  . traZODone (DESYREL) 50 MG tablet Take 0.5-1 tablets (25-50 mg total) by mouth at bedtime as needed for sleep. 30 tablet 3  . triamcinolone cream (KENALOG) 0.1 % as needed. Reported on  09/28/2015    . glipiZIDE (GLUCOTROL XL) 10 MG 24 hr tablet Take 1 tablet (10 mg total) by mouth 2 (two) times daily. (Patient not taking: Reported on 12/19/2015) 90 tablet 1  . hydrOXYzine (ATARAX/VISTARIL) 10 MG tablet Take 10 mg by mouth as needed. Reported on 12/19/2015  2   No current facility-administered medications for this visit.    Objective: BP 120/70 mmHg  Pulse 70  Temp(Src) 97.8 F (36.6 C) (Oral)  Wt 158 lb (71.668 kg)  SpO2 96% Gen: NAD, resting comfortably CV: RRR no murmurs rubs or gallops Lungs: CTAB no crackles, wheeze, rhonchi Ext: no edema Skin: warm, dry  Wants to defer foot exam to next visit  Assessment/Plan:  Diabetes mellitus type II, controlled ([place]) S: poorly controlled last visit. Increased glipizide to 83m twice a day but patient is only taking once a day- states her bottle says this, referred to diabetes education which she found veyr helpful. GI side effects on metformin in past so off Already on januvia 1058meducation.  CBGs- last check 145 in the morning- pretty typical Lab Results  Component Value Date   HGBA1C 8.1* 12/19/2015   HGBA1C 8.9* 08/21/2015   HGBA1C 7.7* 05/21/2015   A/P: a1c returned after visit. Patient has made some significant changes with diet alone. We will increase her glipizide  to twice a day and hope for a1c under 7.5. Next step would be metformin XL potentially- may not be able to tolerate GI wise. eye exam next month- will send records.   Fatty liver disease, nonalcoholic S: patient has lost 3 lbs- goal weight still 130-140 and far off from that. Her AST is up slightly and alk phos is up slightly as well A/P: continue to stress need for weight loss and follow up each visit- already ordered for next visit   Smoker Smoker- not ready to quit now. Son's significant other left the family with 3 kids 3,4,5 and having to help grandkids. She had been considering quitting before this change.   Essential hypertension S:  controlled. On amlodipine 33m, atenolol 1055mBID, losartan 10078mBP Readings from Last 3 Encounters:  12/19/15 120/70  09/28/15 118/60  08/21/15 110/64  A/P:Continue current medications  Thrombocytopenia ([facility]) S: also with macrocytosis. b12 normal. Folate too. Seems to fluctuate but not progressive A/P: consider hematology referral if any worsening. Could consider peripheral smear in future as well    Return in about 3 months (around 03/28/2016) for schedule labs 3 months and a day after today. . Return precautions advised.   Orders Placed This Encounter  Procedures  . CBC    [place]  . Comprehensive metabolic panel    [place]  . Hemoglobin A1c    [place]  . CBC    Standing Status: Future     Number of Occurrences:      Standing Expiration Date: 12/18/2016  . Comprehensive metabolic panel    [place]    Standing Status: Future     Number of Occurrences:      Standing Expiration Date: 12/18/2016  . Hemoglobin A1c    [place]    Standing Status: Future     Number of Occurrences:      Standing Expiration Date: 12/18/2016  . Lipid panel    Standing Status: Future     Number of Occurrences: 1     Standing Expiration Date: 12/18/2016    No orders of the defined types were placed in this encounter.    SteGarret ReddishD

## 2015-12-19 NOTE — Assessment & Plan Note (Signed)
S: also with macrocytosis. b12 normal. Folate too. Seems to fluctuate but not progressive A/P: consider hematology referral if any worsening. Could consider peripheral smear in future as well

## 2015-12-19 NOTE — Patient Instructions (Signed)
Labs today  Likely we will reconfirm need to take 2 glipizide a day and I may add a low dose extended release metformin  Great job with weight loss down 3 lbs. Let's get another 3 lbs or more by next visit  As always, quit smoking

## 2015-12-19 NOTE — Assessment & Plan Note (Signed)
S: poorly controlled last visit. Increased glipizide to 10mg  twice a day but patient is only taking once a day- states her bottle says this, referred to diabetes education which she found veyr helpful. GI side effects on metformin in past so off Already on januvia 100mg  education.  CBGs- last check 145 in the morning- pretty typical Lab Results  Component Value Date   HGBA1C 8.1* 12/19/2015   HGBA1C 8.9* 08/21/2015   HGBA1C 7.7* 05/21/2015   A/P: a1c returned after visit. Patient has made some significant changes with diet alone. We will increase her glipizide to twice a day and hope for a1c under 7.5. Next step would be metformin XL potentially- may not be able to tolerate GI wise. eye exam next month- will send records.

## 2015-12-20 ENCOUNTER — Other Ambulatory Visit: Payer: Self-pay | Admitting: Pediatrics

## 2015-12-20 MED ORDER — GLIPIZIDE ER 10 MG PO TB24: 10.0000 mg | ORAL_TABLET | Freq: Two times a day (BID) | ORAL | Status: DC

## 2015-12-20 NOTE — Telephone Encounter (Signed)
Pt states current gliizide bottle says take one tab daily, the rx sent does state BID. I am sending a new rx in w/ BID instruction per Dr. Ansel Bong order.

## 2016-01-09 ENCOUNTER — Encounter: Payer: Self-pay | Admitting: Podiatry

## 2016-01-09 ENCOUNTER — Ambulatory Visit (INDEPENDENT_AMBULATORY_CARE_PROVIDER_SITE_OTHER): Payer: Medicare Other | Admitting: Podiatry

## 2016-01-09 DIAGNOSIS — B351 Tinea unguium: Secondary | ICD-10-CM

## 2016-01-09 DIAGNOSIS — M79676 Pain in unspecified toe(s): Secondary | ICD-10-CM | POA: Diagnosis not present

## 2016-01-09 NOTE — Progress Notes (Signed)
Patient ID: Anita Mccormick, female   DOB: [birth year], 70 y.o.   MRN: [id number]  Subjective: This patient presents again for scheduled visit complaining thick and long toenails which uncomfortable walking wearing shoes. Also, patient complaining of painful callus in the lateral fifth nail grooves bilaterally  Objective: No open skin lesions bilaterally The toenails are elongated, brittle, incurvated, deformed and tender direct palpation 6-10 Adductovarus rotated fifth toes with callus lateral nail grooves, bilaterally  Assessment: Type II diabetic Symptomatic onychomycoses 6-10 Listers corn is fifth toes bilaterally  Plan: Debridement toenails 10 mechanically electrically without any bleeding Debrided keratoses fifth toes bilaterally with slight bleeding fifth left nail bed. Area was treated with antibiotic ointment and Band-Aid. Patient advised removed Band-Aid 1-3 days and to continue applying topical antibiotic ointment and Band-Aid daily until a scab forms  Reappoint 3 months

## 2016-01-09 NOTE — Patient Instructions (Signed)
Removed Band-Aid on the fifth left toe 1-3 days and continue to apply topical antibiotic ointment and a Band-Aid daily until a scab forms   Diabetes and Foot Care Diabetes may cause you to have problems because of poor blood supply (circulation) to your feet and legs. This may cause the skin on your feet to become thinner, break easier, and heal more slowly. Your skin may become dry, and the skin may peel and crack. You may also have nerve damage in your legs and feet causing decreased feeling in them. You may not notice minor injuries to your feet that could lead to infections or more serious problems. Taking care of your feet is one of the most important things you can do for yourself.  HOME CARE INSTRUCTIONS  Wear shoes at all times, even in the house. Do not go barefoot. Bare feet are easily injured.  Check your feet daily for blisters, cuts, and redness. If you cannot see the bottom of your feet, use a mirror or ask someone for help.  Wash your feet with warm water (do not use hot water) and mild soap. Then pat your feet and the areas between your toes until they are completely dry. Do not soak your feet as this can dry your skin.  Apply a moisturizing lotion or petroleum jelly (that does not contain alcohol and is unscented) to the skin on your feet and to dry, brittle toenails. Do not apply lotion between your toes.  Trim your toenails straight across. Do not dig under them or around the cuticle. File the edges of your nails with an emery board or nail file.  Do not cut corns or calluses or try to remove them with medicine.  Wear clean socks or stockings every day. Make sure they are not too tight. Do not wear knee-high stockings since they may decrease blood flow to your legs.  Wear shoes that fit properly and have enough cushioning. To break in new shoes, wear them for just a few hours a day. This prevents you from injuring your feet. Always look in your shoes before you put them on to  be sure there are no objects inside.  Do not cross your legs. This may decrease the blood flow to your feet.  If you find a minor scrape, cut, or break in the skin on your feet, keep it and the skin around it clean and dry. These areas may be cleansed with mild soap and water. Do not cleanse the area with peroxide, alcohol, or iodine.  When you remove an adhesive bandage, be sure not to damage the skin around it.  If you have a wound, look at it several times a day to make sure it is healing.  Do not use heating pads or hot water bottles. They may burn your skin. If you have lost feeling in your feet or legs, you may not know it is happening until it is too late.  Make sure your health care provider performs a complete foot exam at least annually or more often if you have foot problems. Report any cuts, sores, or bruises to your health care provider immediately. SEEK MEDICAL CARE IF:   You have an injury that is not healing.  You have cuts or breaks in the skin.  You have an ingrown nail.  You notice redness on your legs or feet.  You feel burning or tingling in your legs or feet.  You have pain or cramps in your legs  and feet.  Your legs or feet are numb.  Your feet always feel cold. SEEK IMMEDIATE MEDICAL CARE IF:   There is increasing redness, swelling, or pain in or around a wound.  There is a red line that goes up your leg.  Pus is coming from a wound.  You develop a fever or as directed by your health care provider.  You notice a bad smell coming from an ulcer or wound.   This information is not intended to replace advice given to you by your health care provider. Make sure you discuss any questions you have with your health care provider.   Document Released: 07/11/2000 Document Revised: 03/16/2013 Document Reviewed: 12/21/2012 Elsevier Interactive Patient Education [employer].

## 2016-01-14 LAB — HM DIABETES EYE EXAM

## 2016-01-17 ENCOUNTER — Other Ambulatory Visit: Payer: Self-pay | Admitting: Family Medicine

## 2016-01-24 ENCOUNTER — Encounter: Payer: Self-pay | Admitting: Family Medicine

## 2016-03-20 ENCOUNTER — Ambulatory Visit: Payer: Medicare Other | Admitting: Family Medicine

## 2016-03-24 ENCOUNTER — Other Ambulatory Visit (INDEPENDENT_AMBULATORY_CARE_PROVIDER_SITE_OTHER): Payer: Medicare Other

## 2016-03-24 DIAGNOSIS — E119 Type 2 diabetes mellitus without complications: Secondary | ICD-10-CM | POA: Diagnosis not present

## 2016-03-24 LAB — CBC
HCT: 39.3 % (ref 36.0–46.0)
Hemoglobin: 13.6 g/dL (ref 12.0–15.0)
MCHC: 34.7 g/dL (ref 30.0–36.0)
MCV: 107.1 fl — ABNORMAL HIGH (ref 78.0–100.0)
Platelets: 137 10*3/uL — ABNORMAL LOW (ref 150.0–400.0)
RBC: 3.67 Mil/uL — AB (ref 3.87–5.11)
RDW: 14.4 % (ref 11.5–15.5)
WBC: 5 10*3/uL (ref 4.0–10.5)

## 2016-03-24 LAB — COMPREHENSIVE METABOLIC PANEL
ALBUMIN: 3.6 g/dL (ref 3.5–5.2)
ALT: 36 U/L — AB (ref 0–35)
AST: 69 U/L — AB (ref 0–37)
Alkaline Phosphatase: 135 U/L — ABNORMAL HIGH (ref 39–117)
BUN: 9 mg/dL (ref 6–23)
CO2: 29 mEq/L (ref 19–32)
CREATININE: 0.91 mg/dL (ref 0.40–1.20)
Calcium: 9.3 mg/dL (ref 8.4–10.5)
Chloride: 102 mEq/L (ref 96–112)
GFR: 78.74 mL/min (ref >60.00)
GLUCOSE: 142 mg/dL — AB (ref 70–99)
Potassium: 3.6 mEq/L (ref 3.5–5.1)
SODIUM: 140 meq/L (ref 135–145)
TOTAL PROTEIN: 7.5 g/dL (ref 6.0–8.3)
Total Bilirubin: 0.7 mg/dL (ref 0.2–1.2)

## 2016-03-24 LAB — HEMOGLOBIN A1C: HEMOGLOBIN A1C: 7.4 % — AB (ref 4.6–6.5)

## 2016-03-28 ENCOUNTER — Ambulatory Visit: Payer: Medicare Other | Admitting: Family Medicine

## 2016-04-04 ENCOUNTER — Encounter: Payer: Self-pay | Admitting: Family Medicine

## 2016-04-04 ENCOUNTER — Ambulatory Visit (INDEPENDENT_AMBULATORY_CARE_PROVIDER_SITE_OTHER): Payer: Medicare Other | Admitting: Family Medicine

## 2016-04-04 VITALS — BP 124/70 | HR 84 | Temp 98.4°F | Wt 156.8 lb

## 2016-04-04 DIAGNOSIS — I10 Essential (primary) hypertension: Secondary | ICD-10-CM

## 2016-04-04 DIAGNOSIS — Z23 Encounter for immunization: Secondary | ICD-10-CM | POA: Diagnosis not present

## 2016-04-04 DIAGNOSIS — E119 Type 2 diabetes mellitus without complications: Secondary | ICD-10-CM | POA: Diagnosis not present

## 2016-04-04 DIAGNOSIS — D696 Thrombocytopenia, unspecified: Secondary | ICD-10-CM | POA: Diagnosis not present

## 2016-04-04 DIAGNOSIS — K760 Fatty (change of) liver, not elsewhere classified: Secondary | ICD-10-CM | POA: Diagnosis not present

## 2016-04-04 DIAGNOSIS — Z720 Tobacco use: Secondary | ICD-10-CM

## 2016-04-04 DIAGNOSIS — F17200 Nicotine dependence, unspecified, uncomplicated: Secondary | ICD-10-CM

## 2016-04-04 MED ORDER — METFORMIN HCL ER 500 MG PO TB24: 500.0000 mg | ORAL_TABLET | Freq: Every day | ORAL | 5 refills | Status: DC

## 2016-04-04 NOTE — Assessment & Plan Note (Signed)
S: improved controlled. On Januvia 100mg  and glipizide 10mg  XL BID Lab Results  Component Value Date   HGBA1C 7.4 (H) 03/24/2016   HGBA1C 8.1 (H) 12/19/2015   HGBA1C 8.9 (H) 08/21/2015   A/P: try to add in metformin XR if she can tolerate and affordable- hopeful this would get her to a1c 7 or less

## 2016-04-04 NOTE — Progress Notes (Signed)
Pre visit review using our clinic review tool, if applicable. No additional management support is needed unless otherwise documented below in the visit note. 

## 2016-04-04 NOTE — Assessment & Plan Note (Signed)
S: stable on labs A/P: get cbc before next visit and add peripheral smear especially with macrocytosis but suspect macrocytosis is due to smoking

## 2016-04-04 NOTE — Progress Notes (Signed)
Subjective:  Anita Mccormick is a 70 y.o. year old very pleasant female patient who presents for/with See problem oriented charting ROS- No chest pain or shortness of breath. No headache or blurry vision. No hypoglycemia.see any ROS included in HPI as well.   Past Medical History-  Patient Active Problem List   Diagnosis Date Noted  . Smoker 05/21/2015    Priority: High  . Fatty liver disease, nonalcoholic 123456    Priority: High  . History of CVA (cerebrovascular accident) 10/20/2007    Priority: High  . Diabetes mellitus type II, controlled ([place]) 12/10/2006    Priority: High  . BREAST CANCER, HX OF 12/10/2006    Priority: High  . Thrombocytopenia ([place]) 12/19/2015    Priority: Medium  . Insomnia 08/21/2015    Priority: Medium  . Hyperlipidemia 11/03/2007    Priority: Medium  . Essential hypertension 12/10/2006    Priority: Medium  . Eczema 05/21/2015    Priority: Low    Medications- reviewed and updated Current Outpatient Prescriptions  Medication Sig Dispense Refill  . amLODipine (NORVASC) 5 MG tablet TAKE 1 TABLET (5 MG TOTAL) BY MOUTH DAILY. 90 tablet 3  . aspirin 325 MG tablet Take 325 mg by mouth daily.      [name] atenolol (TENORMIN) 100 MG tablet Take 1 tablet (100 mg total) by mouth 2 (two) times daily. 180 tablet 3  . Calcium Carbonate-Vitamin D (CALCIUM PLUS VITAMIN D) 600-100 MG-UNIT CAPS Take by mouth 2 (two) times daily.      . fluocinonide cream (LIDEX) 0.05 % Reported on 09/28/2015  2  . glipiZIDE (GLUCOTROL XL) 10 MG 24 hr tablet Take 1 tablet (10 mg total) by mouth 2 (two) times daily. 60 tablet 5  . glipiZIDE (GLUCOTROL XL) 10 MG 24 hr tablet Take 1 tablet (10 mg total) by mouth 2 (two) times daily. 90 tablet 3  . glucose blood (ONE TOUCH ULTRA TEST) test strip 1 each by Other route 2 (two) times daily. Use as instructed 100 each 3  . hydrOXYzine (ATARAX/VISTARIL) 10 MG tablet Take 10 mg by mouth as needed. Reported on 12/19/2015  2  . losartan (COZAAR) 100 MG  tablet TAKE 1 TABLET (100 MG TOTAL) BY MOUTH DAILY. 90 tablet 3  . sitaGLIPtin (JANUVIA) 100 MG tablet Take 1 tablet (100 mg total) by mouth daily. 90 tablet 3  . traZODone (DESYREL) 50 MG tablet Take 0.5-1 tablets (25-50 mg total) by mouth at bedtime as needed for sleep. 30 tablet 3  . triamcinolone cream (KENALOG) 0.1 % as needed. Reported on 09/28/2015    . metFORMIN (GLUCOPHAGE-XR) 500 MG 24 hr tablet Take 1 tablet (500 mg total) by mouth daily with breakfast. 30 tablet 5   No current facility-administered medications for this visit.     Objective: BP 124/70 (BP Location: Right Arm, Patient Position: Sitting, Cuff Size: Normal)   Pulse 84   Temp 98.4 F (36.9 C) (Oral)   Wt 156 lb 12.8 oz (71.1 kg)   SpO2 99%   BMI 28.68 kg/m  Gen: NAD, resting comfortably CV: RRR no murmurs rubs or gallops Lungs: CTAB no crackles, wheeze, rhonchi Abdomen: soft/nontender/nondistended/normal bowel sounds. Overweight but improving. No obvious hepatomegaly Ext: no edema Skin: warm, dry Neuro: grossly normal, moves all extremities  Assessment/Plan:   Smoker 5 cigarettes a day- trying to quit completely. Encouraged complete cessation -she wants to continue to just try to cut down. needs root canal- they want no smoking and that is motivation  for her  Diabetes mellitus type II, controlled ([place]) S: improved controlled. On Januvia 100mg  and glipizide 10mg  XL BID Lab Results  Component Value Date   HGBA1C 7.4 (H) 03/24/2016   HGBA1C 8.1 (H) 12/19/2015   HGBA1C 8.9 (H) 08/21/2015   A/P: try to add in metformin XR if she can tolerate and affordable- hopeful this would get her to a1c 7 or less   Fatty liver disease, nonalcoholic S: surprised she lost 2 lbs but very pleased. Weight loss is goal A/P: LFTs stable- will repeat before follow up to trend.    Essential hypertension S: controlled on Amlodipine 5mg , atenolol 100mg  BID, losartan 100mg  BP Readings from Last 3 Encounters:  04/04/16 124/70   12/19/15 120/70  09/28/15 118/60  A/P:Continue current meds:  Well controlled   Thrombocytopenia ([place]) S: stable on labs A/P: get cbc before next visit and add peripheral smear especially with macrocytosis but suspect macrocytosis is due to smoking    Return in about 14 weeks (around 07/11/2016).  Orders Placed This Encounter  Procedures  . Flu vaccine HIGH DOSE PF (Fluzone High dose)  . Comprehensive metabolic panel    [place]    Standing Status:   Future    Standing Expiration Date:   04/04/2017  . Hemoglobin A1c    [employer]    Standing Status:   Future    Standing Expiration Date:   04/04/2017  . CBC    Standing Status:   Future    Standing Expiration Date:   04/04/2017  . Pathologist smear review    Standing Status:   Future    Standing Expiration Date:   04/04/2017    Meds ordered this encounter  Medications  . metFORMIN (GLUCOPHAGE-XR) 500 MG 24 hr tablet    Sig: Take 1 tablet (500 mg total) by mouth daily with breakfast.    Dispense:  30 tablet    Refill:  5    Return precautions advised.  Garret Reddish, MD

## 2016-04-04 NOTE — Patient Instructions (Addendum)
High dose flu shot  If affordable and does not cause stomach effects- want you to take metformin 500mg  exteneded release. This has much lower chance of causing diarrhea than previous metformin pill but it is still possible.   Schedule lab visit before next visit

## 2016-04-04 NOTE — Assessment & Plan Note (Signed)
S: controlled on Amlodipine 5mg , atenolol 100mg  BID, losartan 100mg  BP Readings from Last 3 Encounters:  04/04/16 124/70  12/19/15 120/70  09/28/15 118/60  A/P:Continue current meds:  Well controlled

## 2016-04-04 NOTE — Assessment & Plan Note (Signed)
S: surprised she lost 2 lbs but very pleased. Weight loss is goal A/P: LFTs stable- will repeat before follow up to trend.

## 2016-04-04 NOTE — Assessment & Plan Note (Signed)
5 cigarettes a day- trying to quit completely. Encouraged complete cessation -she wants to continue to just try to cut down. needs root canal- they want no smoking and that is motivation for her

## 2016-04-09 ENCOUNTER — Ambulatory Visit (INDEPENDENT_AMBULATORY_CARE_PROVIDER_SITE_OTHER): Payer: Medicare Other | Admitting: Podiatry

## 2016-04-09 ENCOUNTER — Encounter: Payer: Self-pay | Admitting: Podiatry

## 2016-04-09 VITALS — BP 122/65 | HR 79 | Resp 18

## 2016-04-09 DIAGNOSIS — B351 Tinea unguium: Secondary | ICD-10-CM | POA: Diagnosis not present

## 2016-04-09 DIAGNOSIS — M79676 Pain in unspecified toe(s): Secondary | ICD-10-CM

## 2016-04-09 NOTE — Patient Instructions (Signed)
Diabetes and Foot Care Diabetes may cause you to have problems because of poor blood supply (circulation) to your feet and legs. This may cause the skin on your feet to become thinner, break easier, and heal more slowly. Your skin may become dry, and the skin may peel and crack. You may also have nerve damage in your legs and feet causing decreased feeling in them. You may not notice minor injuries to your feet that could lead to infections or more serious problems. Taking care of your feet is one of the most important things you can do for yourself.  HOME CARE INSTRUCTIONS  Wear shoes at all times, even in the house. Do not go barefoot. Bare feet are easily injured.  Check your feet daily for blisters, cuts, and redness. If you cannot see the bottom of your feet, use a mirror or ask someone for help.  Wash your feet with warm water (do not use hot water) and mild soap. Then pat your feet and the areas between your toes until they are completely dry. Do not soak your feet as this can dry your skin.  Apply a moisturizing lotion or petroleum jelly (that does not contain alcohol and is unscented) to the skin on your feet and to dry, brittle toenails. Do not apply lotion between your toes.  Trim your toenails straight across. Do not dig under them or around the cuticle. File the edges of your nails with an emery board or nail file.  Do not cut corns or calluses or try to remove them with medicine.  Wear clean socks or stockings every day. Make sure they are not too tight. Do not wear knee-high stockings since they may decrease blood flow to your legs.  Wear shoes that fit properly and have enough cushioning. To break in new shoes, wear them for just a few hours a day. This prevents you from injuring your feet. Always look in your shoes before you put them on to be sure there are no objects inside.  Do not cross your legs. This may decrease the blood flow to your feet.  If you find a minor scrape,  cut, or break in the skin on your feet, keep it and the skin around it clean and dry. These areas may be cleansed with mild soap and water. Do not cleanse the area with peroxide, alcohol, or iodine.  When you remove an adhesive bandage, be sure not to damage the skin around it.  If you have a wound, look at it several times a day to make sure it is healing.  Do not use heating pads or hot water bottles. They may burn your skin. If you have lost feeling in your feet or legs, you may not know it is happening until it is too late.  Make sure your health care provider performs a complete foot exam at least annually or more often if you have foot problems. Report any cuts, sores, or bruises to your health care provider immediately. SEEK MEDICAL CARE IF:   You have an injury that is not healing.  You have cuts or breaks in the skin.  You have an ingrown nail.  You notice redness on your legs or feet.  You feel burning or tingling in your legs or feet.  You have pain or cramps in your legs and feet.  Your legs or feet are numb.  Your feet always feel cold. SEEK IMMEDIATE MEDICAL CARE IF:   There is increasing redness,   swelling, or pain in or around a wound.  There is a red line that goes up your leg.  Pus is coming from a wound.  You develop a fever or as directed by your health care provider.  You notice a bad smell coming from an ulcer or wound.   This information is not intended to replace advice given to you by your health care provider. Make sure you discuss any questions you have with your health care provider.   Document Released: 07/11/2000 Document Revised: 03/16/2013 Document Reviewed: 12/21/2012 Elsevier Interactive Patient Education 2016 [employer]  

## 2016-04-10 NOTE — Progress Notes (Addendum)
Patient ID: Anita Mccormick, female   DOB: [birth year], 70 y.o.   MRN: [id number]    Subjective: This patient presents again for scheduled visit complaining thick and long toenails which uncomfortable walking wearing shoes. Also, patient complaining of painful callus in the lateral fifth nail grooves bilaterally  Objective: No open skin lesions bilaterally DP and PT pulses 2/4 bilaterally Sensation to 10 g monofilament wire intact 3/5 right 2/5 left Vibratory sensation reactive bilaterally Ankle reflexes reactive bilaterally Small corn distal fourth right toe The toenails are elongated, brittle, incurvated, deformed and tender direct palpation 6-10 Adductovarus rotated fifth toes with callus lateral nail grooves, bilaterally  Assessment: Type II diabetic Symptomatic onychomycoses 6-10 Listers corn  fifth toes bilaterally  Plan: Debridement toenails 10 mechanically electrically without any bleeding Debrided keratoses fifth toes bilaterally without any bleeding  Reappoint 3 months

## 2016-07-11 ENCOUNTER — Encounter: Payer: Self-pay | Admitting: Family Medicine

## 2016-07-11 ENCOUNTER — Ambulatory Visit (INDEPENDENT_AMBULATORY_CARE_PROVIDER_SITE_OTHER): Payer: Medicare Other | Admitting: Family Medicine

## 2016-07-11 VITALS — BP 128/68 | HR 80 | Temp 98.4°F | Ht 62.0 in | Wt 153.4 lb

## 2016-07-11 DIAGNOSIS — E119 Type 2 diabetes mellitus without complications: Secondary | ICD-10-CM | POA: Diagnosis not present

## 2016-07-11 DIAGNOSIS — I10 Essential (primary) hypertension: Secondary | ICD-10-CM | POA: Diagnosis not present

## 2016-07-11 DIAGNOSIS — F17200 Nicotine dependence, unspecified, uncomplicated: Secondary | ICD-10-CM

## 2016-07-11 DIAGNOSIS — D696 Thrombocytopenia, unspecified: Secondary | ICD-10-CM | POA: Diagnosis not present

## 2016-07-11 DIAGNOSIS — K760 Fatty (change of) liver, not elsewhere classified: Secondary | ICD-10-CM

## 2016-07-11 LAB — COMPREHENSIVE METABOLIC PANEL
ALBUMIN: 3.7 g/dL (ref 3.6–5.1)
ALK PHOS: 130 U/L (ref 33–130)
ALT: 28 U/L (ref 6–29)
AST: 58 U/L — ABNORMAL HIGH (ref 10–35)
BUN: 12 mg/dL (ref 7–25)
CALCIUM: 9.7 mg/dL (ref 8.6–10.4)
CHLORIDE: 103 mmol/L (ref 98–110)
CO2: 25 mmol/L (ref 20–31)
Creat: 0.85 mg/dL (ref 0.50–0.99)
Glucose, Bld: 262 mg/dL — ABNORMAL HIGH (ref 65–99)
POTASSIUM: 4.3 mmol/L (ref 3.5–5.3)
Sodium: 137 mmol/L (ref 135–146)
TOTAL PROTEIN: 7.2 g/dL (ref 6.1–8.1)
Total Bilirubin: 0.9 mg/dL (ref 0.2–1.2)

## 2016-07-11 LAB — CBC
HCT: 37.6 % (ref 35.0–45.0)
Hemoglobin: 12.7 g/dL (ref 11.7–15.5)
MCH: 36.8 pg — ABNORMAL HIGH (ref 27.0–33.0)
MCHC: 33.8 g/dL (ref 32.0–36.0)
MCV: 109 fL — AB (ref 80.0–100.0)
MPV: 10.9 fL (ref 7.5–12.5)
PLATELETS: 132 10*3/uL — AB (ref 140–400)
RBC: 3.45 MIL/uL — AB (ref 3.80–5.10)
RDW: 13.4 % (ref 11.0–15.0)
WBC: 3.7 10*3/uL — AB (ref 3.8–10.8)

## 2016-07-11 LAB — HEMOGLOBIN A1C
HEMOGLOBIN A1C: 6.6 % — AB (ref <5.7)
MEAN PLASMA GLUCOSE: 143 mg/dL

## 2016-07-11 NOTE — Assessment & Plan Note (Signed)
S: controlled on amlodipine 5, atenolol 100 BID, losartan 100. HR ok as well.  BP Readings from Last 3 Encounters:  07/11/16 128/68  04/09/16 122/65  04/04/16 124/70  A/P:Continue current medications

## 2016-07-11 NOTE — Progress Notes (Signed)
Subjective:  Anita Mccormick is a 70 y.o. year old very pleasant female patient who presents for/with See problem oriented charting ROS-  Did have one hypoglycemia episode. No chest pain or shortness of breath. No fever.   Past Medical History-  Patient Active Problem List   Diagnosis Date Noted  . Smoker 05/21/2015    Priority: High  . Fatty liver disease, nonalcoholic 123456    Priority: High  . History of CVA (cerebrovascular accident) 10/20/2007    Priority: High  . Diabetes mellitus type II, controlled ([place]) 12/10/2006    Priority: High  . BREAST CANCER, HX OF 12/10/2006    Priority: High  . Thrombocytopenia ([place]) 12/19/2015    Priority: Medium  . Insomnia 08/21/2015    Priority: Medium  . Hyperlipidemia 11/03/2007    Priority: Medium  . Essential hypertension 12/10/2006    Priority: Medium  . Eczema 05/21/2015    Priority: Low    Medications- reviewed and updated Current Outpatient Prescriptions  Medication Sig Dispense Refill  . amLODipine (NORVASC) 5 MG tablet TAKE 1 TABLET (5 MG TOTAL) BY MOUTH DAILY. 90 tablet 3  . aspirin 325 MG tablet Take 325 mg by mouth daily.      [name] atenolol (TENORMIN) 100 MG tablet Take 1 tablet (100 mg total) by mouth 2 (two) times daily. 180 tablet 3  . Calcium Carbonate-Vitamin D (CALCIUM PLUS VITAMIN D) 600-100 MG-UNIT CAPS Take by mouth 2 (two) times daily.      . fluocinonide cream (LIDEX) 0.05 % Reported on 09/28/2015  2  . glipiZIDE (GLUCOTROL XL) 10 MG 24 hr tablet Take 1 tablet (10 mg total) by mouth 2 (two) times daily. 60 tablet 5  . glipiZIDE (GLUCOTROL XL) 10 MG 24 hr tablet Take 1 tablet (10 mg total) by mouth 2 (two) times daily. 90 tablet 3  . glucose blood (ONE TOUCH ULTRA TEST) test strip 1 each by Other route 2 (two) times daily. Use as instructed 100 each 3  . hydrOXYzine (ATARAX/VISTARIL) 10 MG tablet Take 10 mg by mouth as needed. Reported on 12/19/2015  2  . losartan (COZAAR) 100 MG tablet TAKE 1 TABLET (100 MG TOTAL)  BY MOUTH DAILY. 90 tablet 3  . metFORMIN (GLUCOPHAGE-XR) 500 MG 24 hr tablet Take 1 tablet (500 mg total) by mouth daily with breakfast. 30 tablet 5  . sitaGLIPtin (JANUVIA) 100 MG tablet Take 1 tablet (100 mg total) by mouth daily. 90 tablet 3  . traZODone (DESYREL) 50 MG tablet Take 0.5-1 tablets (25-50 mg total) by mouth at bedtime as needed for sleep. 30 tablet 3  . triamcinolone cream (KENALOG) 0.1 % as needed. Reported on 09/28/2015     No current facility-administered medications for this visit.     Objective: BP 128/68 (BP Location: Right Arm, Patient Position: Sitting, Cuff Size: Normal)   Pulse 80   Temp 98.4 F (36.9 C) (Oral)   Ht 5\' 2"  (1.575 m)   Wt 153 lb 6.4 oz (69.6 kg)   SpO2 98%   BMI 28.06 kg/m  Gen: NAD, resting comfortably in chair CV: RRR no murmurs rubs or gallops Lungs: CTAB no crackles, wheeze, rhonchi Abdomen: overweight but improved Ext: no edema Skin: warm, dry  Assessment/Plan:  Essential hypertension S: controlled on amlodipine 5, atenolol 100 BID, losartan 100. HR ok as well.  BP Readings from Last 3 Encounters:  07/11/16 128/68  04/09/16 122/65  04/04/16 124/70  A/P:Continue current medications   Fatty liver disease, nonalcoholic S:  lost another 3 lbs. Couldn't eat during thanksgiving due to dental work A/P: update LFTs today  Diabetes mellitus type II, controlled ([place]) S: reasonably controlled on last check with a1c 7.4 on januvia 100mg  and glipizide 10mg  Xl BID. Tried to add metformin 500mg  XR for goal less than 7.  CBGs- did have one low into 40s and EMS called but quickly responded and not hospitalized Lab Results  Component Value Date   HGBA1C 7.4 (H) 03/24/2016   HGBA1C 8.1 (H) 12/19/2015   HGBA1C 8.9 (H) 08/21/2015   A/P:  check a1c, did foot exam, stop glipizide in the evening. Get new battery so can check morning sugars and let me know if any below 80  Smoker 5 cigs a day, down to 4. Continue current efforts to cut down-  encouraged complete cessation  Return in about 4 months (around 11/09/2016) for follow up- or sooner if needed.  Return precautions advised.  Garret Reddish, MD

## 2016-07-11 NOTE — Addendum Note (Signed)
Addended by: Elmer Picker on: 07/11/2016 03:31 PM   Modules accepted: Orders

## 2016-07-11 NOTE — Assessment & Plan Note (Signed)
S: reasonably controlled on last check with a1c 7.4 on januvia 100mg  and glipizide 10mg  Xl BID. Tried to add metformin 500mg  XR for goal less than 7.  CBGs- did have one low into 40s and EMS called but quickly responded and not hospitalized Lab Results  Component Value Date   HGBA1C 7.4 (H) 03/24/2016   HGBA1C 8.1 (H) 12/19/2015   HGBA1C 8.9 (H) 08/21/2015   A/P:  check a1c, did foot exam, stop glipizide in the evening. Get new battery so can check morning sugars and let me know if any below 80

## 2016-07-11 NOTE — Assessment & Plan Note (Signed)
S: lost another 3 lbs. Couldn't eat during thanksgiving due to dental work A/P: update LFTs today

## 2016-07-11 NOTE — Progress Notes (Signed)
Pre visit review using our clinic review tool, if applicable. No additional management support is needed unless otherwise documented below in the visit note. 

## 2016-07-11 NOTE — Addendum Note (Signed)
Addended by: Elmer Picker on: 07/11/2016 03:32 PM   Modules accepted: Orders

## 2016-07-11 NOTE — Patient Instructions (Addendum)
Stop nighttime glipizide- continue all other medications. New a1c goal <7.5 in hopes to avoid low blood sugars  Great job cutting down on cigarettes! Advise quitting completely  Labs before you leave

## 2016-07-11 NOTE — Assessment & Plan Note (Signed)
5 cigs a day, down to 4. Continue current efforts to cut down- encouraged complete cessation

## 2016-07-14 LAB — PATHOLOGIST SMEAR REVIEW

## 2016-07-16 ENCOUNTER — Encounter: Payer: Self-pay | Admitting: Podiatry

## 2016-07-16 ENCOUNTER — Ambulatory Visit (INDEPENDENT_AMBULATORY_CARE_PROVIDER_SITE_OTHER): Payer: Medicare Other | Admitting: Podiatry

## 2016-07-16 VITALS — BP 141/71 | HR 80

## 2016-07-16 DIAGNOSIS — B351 Tinea unguium: Secondary | ICD-10-CM

## 2016-07-16 DIAGNOSIS — M79676 Pain in unspecified toe(s): Secondary | ICD-10-CM

## 2016-07-16 NOTE — Patient Instructions (Signed)

## 2016-07-17 NOTE — Progress Notes (Signed)
Patient ID: Anita Mccormick, female   DOB: [birth year], 70 y.o.   MRN: [id number]    Subjective: This patient presents again for scheduled visit complaining thick and long toenails which uncomfortable walking wearing shoes. Also, patient complaining of painful callus in the lateral fifth nail grooves bilaterally  Objective: No open skin lesions bilaterally DP and PT pulses 2/4 bilaterally Sensation to 10 g monofilament wire intact 3/5 right 2/5 left Vibratory sensation reactive bilaterally Ankle reflexes reactive bilaterally Small corn distal fourth right toe The toenails are elongated, brittle, incurvated, deformed and tender direct palpation 6-10 Adductovarus rotated fifth toes with callus lateral nail grooves, bilaterally Manual more of your testing dorsi flexion, plantar flexion, inversion, eversion 5/5 bilaterally  Assessment: Type II diabetic Mild Decrease of protective sensation Symptomatic onychomycoses 6-10 Listers corn  fifth toes bilaterally  Plan: Debridement toenails 10 mechanically electrically without any bleeding Debrided keratoses fifth toes bilaterally without any bleeding  Reappoint 3 months

## 2016-08-25 DIAGNOSIS — E119 Type 2 diabetes mellitus without complications: Secondary | ICD-10-CM | POA: Diagnosis not present

## 2016-08-26 ENCOUNTER — Other Ambulatory Visit: Payer: Self-pay | Admitting: Family Medicine

## 2016-09-02 ENCOUNTER — Other Ambulatory Visit: Payer: Self-pay | Admitting: Family Medicine

## 2016-09-03 ENCOUNTER — Other Ambulatory Visit: Payer: Self-pay

## 2016-09-03 MED ORDER — METFORMIN HCL ER 500 MG PO TB24: 500.0000 mg | ORAL_TABLET | Freq: Every day | ORAL | 1 refills | Status: DC

## 2016-09-04 ENCOUNTER — Other Ambulatory Visit: Payer: Self-pay | Admitting: Family Medicine

## 2016-09-10 DIAGNOSIS — Z1231 Encounter for screening mammogram for malignant neoplasm of breast: Secondary | ICD-10-CM | POA: Diagnosis not present

## 2016-09-10 DIAGNOSIS — Z853 Personal history of malignant neoplasm of breast: Secondary | ICD-10-CM | POA: Diagnosis not present

## 2016-09-10 LAB — HM MAMMOGRAPHY

## 2016-09-15 ENCOUNTER — Encounter: Payer: Self-pay | Admitting: Family Medicine

## 2016-10-15 ENCOUNTER — Ambulatory Visit: Payer: Medicare Other | Admitting: Podiatry

## 2016-10-20 ENCOUNTER — Other Ambulatory Visit: Payer: Self-pay | Admitting: Family Medicine

## 2016-10-23 ENCOUNTER — Other Ambulatory Visit: Payer: Self-pay | Admitting: Family Medicine

## 2016-10-23 ENCOUNTER — Ambulatory Visit: Payer: Medicare Other | Admitting: Podiatry

## 2016-10-27 ENCOUNTER — Encounter: Payer: Self-pay | Admitting: Podiatry

## 2016-10-27 ENCOUNTER — Ambulatory Visit (INDEPENDENT_AMBULATORY_CARE_PROVIDER_SITE_OTHER): Payer: Medicare Other | Admitting: Podiatry

## 2016-10-27 ENCOUNTER — Telehealth: Payer: Self-pay | Admitting: *Deleted

## 2016-10-27 DIAGNOSIS — B351 Tinea unguium: Secondary | ICD-10-CM | POA: Diagnosis not present

## 2016-10-27 DIAGNOSIS — R0989 Other specified symptoms and signs involving the circulatory and respiratory systems: Secondary | ICD-10-CM | POA: Diagnosis not present

## 2016-10-27 DIAGNOSIS — M79676 Pain in unspecified toe(s): Secondary | ICD-10-CM | POA: Diagnosis not present

## 2016-10-27 DIAGNOSIS — L608 Other nail disorders: Secondary | ICD-10-CM

## 2016-10-27 DIAGNOSIS — R238 Other skin changes: Secondary | ICD-10-CM | POA: Diagnosis not present

## 2016-10-27 DIAGNOSIS — L609 Nail disorder, unspecified: Secondary | ICD-10-CM | POA: Diagnosis not present

## 2016-10-27 NOTE — Telephone Encounter (Addendum)
-----   Message from [name], DPM sent at 10/27/2016  1:10 PM EDT ----- Can you please order arterial duplex for decreased pulses bilateral? Thanks. Orders faxed to [facility].

## 2016-10-27 NOTE — Progress Notes (Signed)
Subjective: 71 y.o. returns the office today for painful, elongated, thickened toenails which she cannot trim herself. Denies any redness or drainage around the nails. Denies any acute changes since last appointment and no new complaints today. Denies any systemic complaints such as fevers, chills, nausea, vomiting.   She also had ABIs performed by home health nurse. On the left side was 0.84 on the right 0.93. She denies any claudication symptoms.  Objective: AAO 3, NAD DP/PT pulses palpable with mild decrease, CRT less than 3 seconds Nails hypertrophic, dystrophic, elongated, brittle, discolored 10. There is tenderness overlying the nails 1-5 bilaterally. There is no surrounding erythema or drainage along the nail sites. On the right hallux toenail is a longitudinal hyperpigmented lesion. She is unsure calluses been there for probably a couple months she states. There is no extension of hyperpigmentation into the surrounding skin and upon debridement did not appear to be entering the nailbed. No open lesions or pre-ulcerative lesions are identified. No other areas of tenderness bilateral lower extremities. No overlying edema, erythema, increased warmth. No pain with calf compression, swelling, warmth, erythema.  Assessment: Patient presents with symptomatic onychomycosis; hyperpigmented toenail lesion; PVD  Plan: -Treatment options including alternatives, risks, complications were discussed -Nails sharply debrided 10 without complication/bleeding. -I sent a biopsy for the right fourth toenail after debridement this was sent to [facility] labs -Will order arterial duplex to further evaluate circulation.  -Discussed daily foot inspection. If there are any changes, to call the office immediately.  -Follow-up in 3 months or sooner if any problems are to arise. In the meantime, encouraged to call the office with any questions, concerns, changes symptoms.  Celesta Gentile, DPM

## 2016-10-28 ENCOUNTER — Other Ambulatory Visit: Payer: Self-pay | Admitting: Family Medicine

## 2016-10-28 NOTE — Addendum Note (Signed)
Addended by: Cranford Mon R on: 10/28/2016 11:41 AM   Modules accepted: Orders

## 2016-10-31 ENCOUNTER — Other Ambulatory Visit: Payer: Self-pay | Admitting: Family Medicine

## 2016-10-31 MED ORDER — LOSARTAN POTASSIUM 100 MG PO TABS: 100.0000 mg | ORAL_TABLET | Freq: Every day | ORAL | 2 refills | Status: DC

## 2016-11-10 ENCOUNTER — Other Ambulatory Visit: Payer: Self-pay | Admitting: Podiatry

## 2016-11-10 DIAGNOSIS — R0989 Other specified symptoms and signs involving the circulatory and respiratory systems: Secondary | ICD-10-CM

## 2016-11-13 ENCOUNTER — Ambulatory Visit: Payer: Medicare Other | Admitting: Family Medicine

## 2016-11-17 ENCOUNTER — Ambulatory Visit (HOSPITAL_COMMUNITY)
Admission: RE | Admit: 2016-11-17 | Discharge: 2016-11-17 | Disposition: A | Discharge status: Home / Self Care | Payer: Medicare Other | Source: Ambulatory Visit | Attending: Cardiology | Admitting: Cardiology

## 2016-11-17 DIAGNOSIS — R0989 Other specified symptoms and signs involving the circulatory and respiratory systems: Secondary | ICD-10-CM

## 2016-11-17 DIAGNOSIS — Z87891 Personal history of nicotine dependence: Secondary | ICD-10-CM | POA: Insufficient documentation

## 2016-11-17 DIAGNOSIS — I10 Essential (primary) hypertension: Secondary | ICD-10-CM | POA: Diagnosis not present

## 2016-11-17 DIAGNOSIS — E785 Hyperlipidemia, unspecified: Secondary | ICD-10-CM | POA: Diagnosis not present

## 2016-11-17 DIAGNOSIS — E119 Type 2 diabetes mellitus without complications: Secondary | ICD-10-CM | POA: Diagnosis not present

## 2016-11-21 ENCOUNTER — Ambulatory Visit (INDEPENDENT_AMBULATORY_CARE_PROVIDER_SITE_OTHER): Payer: Medicare Other | Admitting: Family Medicine

## 2016-11-21 ENCOUNTER — Encounter: Payer: Self-pay | Admitting: Family Medicine

## 2016-11-21 VITALS — BP 122/60 | HR 78 | Temp 98.5°F | Ht 62.0 in | Wt 163.2 lb

## 2016-11-21 DIAGNOSIS — I10 Essential (primary) hypertension: Secondary | ICD-10-CM

## 2016-11-21 DIAGNOSIS — F17200 Nicotine dependence, unspecified, uncomplicated: Secondary | ICD-10-CM

## 2016-11-21 DIAGNOSIS — D696 Thrombocytopenia, unspecified: Secondary | ICD-10-CM | POA: Diagnosis not present

## 2016-11-21 DIAGNOSIS — E119 Type 2 diabetes mellitus without complications: Secondary | ICD-10-CM

## 2016-11-21 DIAGNOSIS — K760 Fatty (change of) liver, not elsewhere classified: Secondary | ICD-10-CM | POA: Diagnosis not present

## 2016-11-21 LAB — CBC WITH DIFFERENTIAL/PLATELET
BASOS ABS: 0.1 10*3/uL (ref 0.0–0.1)
Basophils Relative: 1.6 % (ref 0.0–3.0)
EOS ABS: 0.2 10*3/uL (ref 0.0–0.7)
Eosinophils Relative: 6.4 % — ABNORMAL HIGH (ref 0.0–5.0)
HCT: 37.7 % (ref 36.0–46.0)
Hemoglobin: 12.8 g/dL (ref 12.0–15.0)
LYMPHS ABS: 0.9 10*3/uL (ref 0.7–4.0)
Lymphocytes Relative: 24 % (ref 12.0–46.0)
MCHC: 33.8 g/dL (ref 30.0–36.0)
MCV: 110.5 fl — ABNORMAL HIGH (ref 78.0–100.0)
MONO ABS: 0.6 10*3/uL (ref 0.1–1.0)
Monocytes Relative: 15.3 % — ABNORMAL HIGH (ref 3.0–12.0)
NEUTROS PCT: 52.7 % (ref 43.0–77.0)
Neutro Abs: 2 10*3/uL (ref 1.4–7.7)
Platelets: 136 10*3/uL — ABNORMAL LOW (ref 150.0–400.0)
RBC: 3.41 Mil/uL — AB (ref 3.87–5.11)
RDW: 14.3 % (ref 11.5–15.5)
WBC: 3.8 10*3/uL — ABNORMAL LOW (ref 4.0–10.5)

## 2016-11-21 LAB — COMPREHENSIVE METABOLIC PANEL
ALBUMIN: 3.5 g/dL (ref 3.5–5.2)
ALT: 26 U/L (ref 0–35)
AST: 52 U/L — ABNORMAL HIGH (ref 0–37)
Alkaline Phosphatase: 140 U/L — ABNORMAL HIGH (ref 39–117)
BILIRUBIN TOTAL: 0.8 mg/dL (ref 0.2–1.2)
BUN: 9 mg/dL (ref 6–23)
CO2: 27 mEq/L (ref 19–32)
CREATININE: 0.89 mg/dL (ref 0.40–1.20)
Calcium: 9.6 mg/dL (ref 8.4–10.5)
Chloride: 105 mEq/L (ref 96–112)
GFR: 80.62 mL/min (ref >60.00)
Glucose, Bld: 263 mg/dL — ABNORMAL HIGH (ref 70–99)
Potassium: 4.1 mEq/L (ref 3.5–5.1)
SODIUM: 140 meq/L (ref 135–145)
TOTAL PROTEIN: 6.6 g/dL (ref 6.0–8.3)

## 2016-11-21 LAB — HEMOGLOBIN A1C: Hgb A1c MFr Bld: 7.3 % — ABNORMAL HIGH (ref 4.6–6.5)

## 2016-11-21 NOTE — Assessment & Plan Note (Signed)
S:  4 cigarettes a day last visit. Stable today A/P: not ready to quit- strongly encouraged. We discussed follow up each visit

## 2016-11-21 NOTE — Assessment & Plan Note (Signed)
S: suspect mild poorly controlled- home check was 7.1 up from 6.6 with insurance. On januvia 100mg , glipizide 10mg  AM only, metformin 500mg  XR CBGs- last visit had evening low into 40s- we stopped glipizide.  Lab Results  Component Value Date   HGBA1C 6.6 (H) 07/11/2016   HGBA1C 7.4 (H) 03/24/2016   HGBA1C 8.1 (H) 12/19/2015   A/P: will update a1c, discussed importance of weight loss. Thrilled no low blood sugars

## 2016-11-21 NOTE — Progress Notes (Signed)
Pre visit review using our clinic review tool, if applicable. No additional management support is needed unless otherwise documented below in the visit note. 

## 2016-11-21 NOTE — Assessment & Plan Note (Signed)
S: weight trending up. Discussed risks to her with fatty liver and fact could turn into cirrhosis of liver.  Wt Readings from Last 3 Encounters:  11/21/16 163 lb 3.2 oz (74 kg)  07/11/16 153 lb 6.4 oz (69.6 kg)  04/04/16 156 lb 12.8 oz (71.1 kg)  A/P: Encouraged need for healthy eating, regular exercise, weight loss.  Update LFTs

## 2016-11-21 NOTE — Assessment & Plan Note (Signed)
Update cbc. Slightly low but stable. Continue to update every 6 months Lab Results  Component Value Date   WBC 3.7 (L) 07/11/2016   HGB 12.7 07/11/2016   HCT 37.6 07/11/2016   MCV 109.0 (H) 07/11/2016   PLT 132 (L) 07/11/2016

## 2016-11-21 NOTE — Patient Instructions (Addendum)
Weight has crept up. Have to get restarted walking. And would consider weight watchers. I could also refer you to diabetes education if you would like.   Please stop by lab before you go  ______________________________________________________________________  Starting October 1st 2018, I will be transferring to our new location: [place] (corner of [address] from [employer]) [place], [place] Phone: [phone]  I would love to have you remain my patient at this new location as long as it remains convenient for you. I am excited about the opportunity to have x-ray and sports medicine in the new building but will really miss the awesome staff and physicians at [facility]. Continue to schedule appointments at [facility] and we will automatically transfer them to the [place] location starting October 1st.

## 2016-11-21 NOTE — Progress Notes (Signed)
Subjective:  Anita Mccormick is a 71 y.o. year old very pleasant female patient who presents for/with See problem oriented charting ROS- No chest pain or shortness of breath. No headache or blurry vision.  No low blodo sugars   Past Medical History-  Patient Active Problem List   Diagnosis Date Noted  . Smoker 05/21/2015    Priority: High  . Fatty liver disease, nonalcoholic 96/10/5407    Priority: High  . History of CVA (cerebrovascular accident) 10/20/2007    Priority: High  . Diabetes mellitus type II, controlled ([place]) 12/10/2006    Priority: High  . BREAST CANCER, HX OF 12/10/2006    Priority: High  . Thrombocytopenia ([place]) 12/19/2015    Priority: Medium  . Insomnia 08/21/2015    Priority: Medium  . Hyperlipidemia 11/03/2007    Priority: Medium  . Essential hypertension 12/10/2006    Priority: Medium  . Eczema 05/21/2015    Priority: Low    Medications- reviewed and updated Current Outpatient Prescriptions  Medication Sig Dispense Refill  . amLODipine (NORVASC) 5 MG tablet TAKE 1 TABLET (5 MG TOTAL) BY MOUTH DAILY. 90 tablet 3  . aspirin 325 MG tablet Take 325 mg by mouth daily.      [name] atenolol (TENORMIN) 100 MG tablet TAKE 1 TABLET BY MOUTH TWICE A DAY 180 tablet 3  . Calcium Carbonate-Vitamin D (CALCIUM PLUS VITAMIN D) 600-100 MG-UNIT CAPS Take by mouth 2 (two) times daily.      . fluocinonide cream (LIDEX) 0.05 % Reported on 09/28/2015  2  . glipiZIDE (GLUCOTROL XL) 10 MG 24 hr tablet Take 1 tablet (10 mg total) by mouth 2 (two) times daily. 90 tablet 3  . glucose blood (ONE TOUCH ULTRA TEST) test strip 1 each by Other route 2 (two) times daily. Use as instructed 100 each 3  . hydrOXYzine (ATARAX/VISTARIL) 10 MG tablet Take 10 mg by mouth as needed. Reported on 12/19/2015  2  . JANUVIA 100 MG tablet TAKE 1 TABLET BY MOUTH EVERY DAY 90 tablet 3  . losartan (COZAAR) 100 MG tablet Take 1 tablet (100 mg total) by mouth daily. 90 tablet 2  . metFORMIN (GLUCOPHAGE-XR) 500 MG 24  hr tablet Take 1 tablet (500 mg total) by mouth daily with breakfast. 90 tablet 1  . traZODone (DESYREL) 50 MG tablet Take 0.5-1 tablets (25-50 mg total) by mouth at bedtime as needed for sleep. 30 tablet 3  . triamcinolone cream (KENALOG) 0.1 % as needed. Reported on 09/28/2015     No current facility-administered medications for this visit.     Objective: BP 122/60 (BP Location: Right Arm, Patient Position: Sitting, Cuff Size: Normal)   Pulse 78   Temp 98.5 F (36.9 C) (Oral)   Ht 5\' 2"  (1.575 m)   Wt 163 lb 3.2 oz (74 kg)   SpO2 97%   BMI 29.85 kg/m  Gen: NAD, resting comfortably Slight amount of wax left ear canal CV: RRR no murmurs rubs or gallops Lungs: CTAB no crackles, wheeze, rhonchi Abdomen: soft/nontender/nondistended/normal bowel sounds. overweight  Ext: trace edema Skin: warm, dry  Assessment/Plan:  Hypertension S: controlled on amlodipine 5mg , atenolol 100 BID, losartan 100mg .  BP Readings from Last 3 Encounters:  11/21/16 122/60  07/16/16 (!) 141/71  07/11/16 128/68  A/P:Continue current meds  Smoker S:  4 cigarettes a day last visit. Stable today A/P: not ready to quit- strongly encouraged. We discussed follow up each visit  Fatty liver disease, nonalcoholic S: weight trending up.  Discussed risks to her with fatty liver and fact could turn into cirrhosis of liver.  Wt Readings from Last 3 Encounters:  11/21/16 163 lb 3.2 oz (74 kg)  07/11/16 153 lb 6.4 oz (69.6 kg)  04/04/16 156 lb 12.8 oz (71.1 kg)  A/P: Encouraged need for healthy eating, regular exercise, weight loss.  Update LFTs  Diabetes mellitus type II, controlled ([place]) S: suspect mild poorly controlled- home check was 7.1 up from 6.6 with insurance. On januvia 100mg , glipizide 10mg  AM only, metformin 500mg  XR CBGs- last visit had evening low into 40s- we stopped glipizide.  Lab Results  Component Value Date   HGBA1C 6.6 (H) 07/11/2016   HGBA1C 7.4 (H) 03/24/2016   HGBA1C 8.1 (H) 12/19/2015    A/P: will update a1c, discussed importance of weight loss. Thrilled no low blood sugars  Thrombocytopenia ([facility]) Update cbc. Slightly low but stable. Continue to update every 6 months Lab Results  Component Value Date   WBC 3.7 (L) 07/11/2016   HGB 12.7 07/11/2016   HCT 37.6 07/11/2016   MCV 109.0 (H) 07/11/2016   PLT 132 (L) 07/11/2016     Return in about 14 weeks (around 02/27/2017) for physical.  Orders Placed This Encounter  Procedures  . CBC with Differential/Platelet  . Comprehensive metabolic panel    [place]  . Hemoglobin A1c    [place]   Return precautions advised.  Garret Reddish, MD

## 2016-11-24 ENCOUNTER — Ambulatory Visit (INDEPENDENT_AMBULATORY_CARE_PROVIDER_SITE_OTHER): Payer: Medicare Other | Admitting: Podiatry

## 2016-11-24 ENCOUNTER — Encounter: Payer: Self-pay | Admitting: Podiatry

## 2016-11-24 DIAGNOSIS — L608 Other nail disorders: Secondary | ICD-10-CM | POA: Diagnosis not present

## 2016-11-24 DIAGNOSIS — L603 Nail dystrophy: Secondary | ICD-10-CM | POA: Diagnosis not present

## 2016-11-28 NOTE — Progress Notes (Signed)
Subjective: 71 year old female presents the office status discussed culture results as well as vascular studies. She states that she is doing well she has no concerns today. Denies any pain to the toenails denies any redness or swelling or any drainage. Denies any systemic complaints such as fevers, chills, nausea, vomiting. No acute changes since last appointment, and no other complaints at this time.   Objective: AAO x3, NAD DP/PT pulses palpable bilaterally, CRT less than 3 seconds Nails appear to be dystrophic, discolored, hypertrophic. There is no pain in the toenails because redness or drainage. On the right fourth toe on the biopsy site there does appear to be some dark discoloration of the nail but there is no surrounding hyperpigmentation into the skin and upon debridement of the nail there is no underlying hyperpigmentation into the nail bed. Remainder the toes also have a darkened discoloration as well and they all appear to be similar. She states this been ongoing for some time and has not changed and she has not noticed any worsening. She states that she's had a nail trims done previously that this is also been mentioned to her. No open lesions or pre-ulcerative lesions.  No pain with calf compression, swelling, warmth, erythema  Assessment: Onychodystrophy, melanin debridement  Plan: -All treatment options discussed with the patient including all alternatives, risks, complications.  -I discussed the nail culture results of the patient which reveal melanin pigment within the nail. This is unchanged has been ongoing for some time. Discussed with her this is likely due to a normal in her case however we'll continue to monitor the area closely. There is no extension of any hyperpigmentation appears to be localized only within the toenail. Although the right fourth toe appears to be mildly worse in the other toenails she does have the same color change the other nails as well. No continue to  monitor this any worsening or further biopsy but we'll hold off for now. -Discussed the vascular studies as well as the patient. -Patient encouraged to call the office with any questions, concerns, change in symptoms.   Celesta Gentile, DPM

## 2017-01-17 ENCOUNTER — Ambulatory Visit (INDEPENDENT_AMBULATORY_CARE_PROVIDER_SITE_OTHER): Payer: Medicare Other | Admitting: Family Medicine

## 2017-01-17 ENCOUNTER — Encounter: Payer: Self-pay | Admitting: Family Medicine

## 2017-01-17 DIAGNOSIS — J069 Acute upper respiratory infection, unspecified: Secondary | ICD-10-CM | POA: Diagnosis not present

## 2017-01-17 DIAGNOSIS — B9789 Other viral agents as the cause of diseases classified elsewhere: Secondary | ICD-10-CM

## 2017-01-17 MED ORDER — GUAIFENESIN-CODEINE 100-10 MG/5ML PO SOLN: 10.0000 mL | Freq: Three times a day (TID) | ORAL | 0 refills | Status: DC | PRN

## 2017-01-17 NOTE — Progress Notes (Signed)
  Tommi Rumps, MD Phone: [phone]  Anita Mccormick is a 71 y.o. female who presents today for same-day visit.  Patient reports a couple of weeks of symptoms. Had upper respiratory congestion in her sinuses and nose that has started to improve and clear out. Has had some postnasal drip and chest congestion. Has had some cough. Coughing up clear mucus. No fevers. No shortness of breath. Notes some soreness related to the cough. She's tried Mucinex DM. Overall feels as though she is improving. She notes the cough is thing that is bothering her the most.  PMH: Smoker though no history of COPD. ROS see history of present illness  Objective  Physical Exam Vitals:   01/17/17 1034  BP: 128/62  Pulse: 70  Temp: 98.8 F (37.1 C)    BP Readings from Last 3 Encounters:  01/17/17 128/62  11/21/16 122/60  07/16/16 (!) 141/71   Wt Readings from Last 3 Encounters:  01/17/17 158 lb 12 oz (72 kg)  11/21/16 163 lb 3.2 oz (74 kg)  07/11/16 153 lb 6.4 oz (69.6 kg)    Physical Exam  Constitutional: No distress.  HENT:  Head: Normocephalic and atraumatic.  Mouth/Throat: Oropharynx is clear and moist. No oropharyngeal exudate.  Normal TMs  Eyes: Conjunctivae are normal. Pupils are equal, round, and reactive to light.  Neck: Neck supple.  Cardiovascular: Normal rate, regular rhythm and normal heart sounds.   Pulmonary/Chest: Effort normal and breath sounds normal.  Lymphadenopathy:    She has no cervical adenopathy.  Skin: She is not diaphoretic.     Assessment/Plan: Please see individual problem list.  Viral URI with cough Symptoms most consistent with viral upper respiratory illness given that she has been improving. Doubt bacterial illness. Benign exam with no focal findings indicate bacterial illness. Discussed no need for an antibiotic at this time. She has responded well to codeine cough syrup in the past. This was prescribed to her today. Advised this could make her drowsy and  she should not drive while taking this. She was given return precautions.   No orders of the defined types were placed in this encounter.   Meds ordered this encounter  Medications  . guaiFENesin-codeine 100-10 MG/5ML syrup    Sig: Take 10 mLs by mouth 3 (three) times daily as needed for cough.    Dispense:  120 mL    Refill:  0   Tommi Rumps, MD [place]

## 2017-01-17 NOTE — Patient Instructions (Signed)
Nice to meet you. You likely have a viral illness given that you're improving. Your lungs sound great. We will treat your cough with codeine cough syrup. Please be aware as this may make you drowsy so do not drive with this. If you develop fevers, cough productive of blood, shortness of breath, or any new or changing symptoms please seek medical attention immediately.

## 2017-01-17 NOTE — Assessment & Plan Note (Signed)
Symptoms most consistent with viral upper respiratory illness given that she has been improving. Doubt bacterial illness. Benign exam with no focal findings indicate bacterial illness. Discussed no need for an antibiotic at this time. She has responded well to codeine cough syrup in the past. This was prescribed to her today. Advised this could make her drowsy and she should not drive while taking this. She was given return precautions.

## 2017-02-03 ENCOUNTER — Ambulatory Visit: Payer: Medicare Other | Admitting: Podiatry

## 2017-03-10 ENCOUNTER — Ambulatory Visit: Payer: Medicare Other | Admitting: Podiatry

## 2017-03-18 ENCOUNTER — Ambulatory Visit (INDEPENDENT_AMBULATORY_CARE_PROVIDER_SITE_OTHER): Payer: Medicare Other | Admitting: Family Medicine

## 2017-03-18 ENCOUNTER — Encounter: Payer: Self-pay | Admitting: Family Medicine

## 2017-03-18 VITALS — BP 104/52 | HR 71 | Temp 98.1°F | Ht 62.0 in | Wt 157.2 lb

## 2017-03-18 DIAGNOSIS — Z853 Personal history of malignant neoplasm of breast: Secondary | ICD-10-CM

## 2017-03-18 DIAGNOSIS — Z0000 Encounter for general adult medical examination without abnormal findings: Secondary | ICD-10-CM

## 2017-03-18 DIAGNOSIS — F17200 Nicotine dependence, unspecified, uncomplicated: Secondary | ICD-10-CM | POA: Diagnosis not present

## 2017-03-18 DIAGNOSIS — I10 Essential (primary) hypertension: Secondary | ICD-10-CM

## 2017-03-18 DIAGNOSIS — E785 Hyperlipidemia, unspecified: Secondary | ICD-10-CM | POA: Diagnosis not present

## 2017-03-18 DIAGNOSIS — Z86010 Personal history of colonic polyps: Secondary | ICD-10-CM | POA: Diagnosis not present

## 2017-03-18 DIAGNOSIS — E119 Type 2 diabetes mellitus without complications: Secondary | ICD-10-CM | POA: Diagnosis not present

## 2017-03-18 DIAGNOSIS — D696 Thrombocytopenia, unspecified: Secondary | ICD-10-CM

## 2017-03-18 DIAGNOSIS — K760 Fatty (change of) liver, not elsewhere classified: Secondary | ICD-10-CM

## 2017-03-18 LAB — COMPREHENSIVE METABOLIC PANEL
ALBUMIN: 3.4 g/dL — AB (ref 3.5–5.2)
ALK PHOS: 130 U/L — AB (ref 39–117)
ALT: 36 U/L — ABNORMAL HIGH (ref 0–35)
AST: 87 U/L — ABNORMAL HIGH (ref 0–37)
BUN: 7 mg/dL (ref 6–23)
CALCIUM: 10.1 mg/dL (ref 8.4–10.5)
CHLORIDE: 99 meq/L (ref 96–112)
CO2: 28 mEq/L (ref 19–32)
Creatinine, Ser: 0.99 mg/dL (ref 0.40–1.20)
GFR: 71.24 mL/min (ref >60.00)
Glucose, Bld: 126 mg/dL — ABNORMAL HIGH (ref 70–99)
POTASSIUM: 3.9 meq/L (ref 3.5–5.1)
SODIUM: 138 meq/L (ref 135–145)
TOTAL PROTEIN: 7.3 g/dL (ref 6.0–8.3)
Total Bilirubin: 1 mg/dL (ref 0.2–1.2)

## 2017-03-18 LAB — CBC
HEMATOCRIT: 37.3 % (ref 36.0–46.0)
HEMOGLOBIN: 12.3 g/dL (ref 12.0–15.0)
MCHC: 33 g/dL (ref 30.0–36.0)
PLATELETS: 175 10*3/uL (ref 150.0–400.0)
RBC: 3.32 Mil/uL — AB (ref 3.87–5.11)
RDW: 14.1 % (ref 11.5–15.5)
WBC: 5.6 10*3/uL (ref 4.0–10.5)

## 2017-03-18 LAB — LIPID PANEL
CHOLESTEROL: 146 mg/dL (ref 0–200)
HDL: 49.8 mg/dL (ref >39.00)
LDL CALC: 65 mg/dL (ref 0–99)
NonHDL: 96.07
TRIGLYCERIDES: 154 mg/dL — AB (ref 0.0–149.0)
Total CHOL/HDL Ratio: 3
VLDL: 30.8 mg/dL (ref 0.0–40.0)

## 2017-03-18 LAB — HEMOGLOBIN A1C: Hgb A1c MFr Bld: 6.8 % — ABNORMAL HIGH (ref 4.6–6.5)

## 2017-03-18 NOTE — Progress Notes (Signed)
Phone: [phone]  Subjective:  Patient presents today for their annual physical. Chief complaint-noted.   See problem oriented charting- ROS- full  review of systems was completed and negative including No chest pain or shortness of breath. No headache or blurry vision. No low blood sugars recently  The following were reviewed and entered/updated in epic: Past Medical History:  Diagnosis Date  . Cancer [facility] [place])    history of no adjunctive RX( no chemo no RT)  . Cerebrovascular accident ([place])   . COLONIC POLYPS, HX OF 12/10/2006   Qualifier: Diagnosis of  By: Leanne Chang MD, Bruce    . Diabetes mellitus    Type 2  . Diabetes mellitus type II, controlled ([place]) 12/10/2006   Poor control on Januvia 100mg  and glipizide 10mg  XL Lab Results  Component Value Date   HGBA1C 8.3* 11/02/2014       . Eczema   . Fatty liver disease, nonalcoholic 82/50/5397   Per Dr. Leanne Chang Lab Results  Component Value Date   ALT 31 11/02/2014   AST 57* 11/02/2014   ALKPHOS 104 11/02/2014   BILITOT 1.1 11/02/2014      . Hypertension    Patient Active Problem List   Diagnosis Date Noted  . Smoker 05/21/2015    Priority: High  . Fatty liver disease, nonalcoholic 67/34/1937    Priority: High  . History of CVA (cerebrovascular accident) 10/20/2007    Priority: High  . Diabetes mellitus type II, controlled ([place]) 12/10/2006    Priority: High  . BREAST CANCER, HX OF 12/10/2006    Priority: High  . Thrombocytopenia ([place]) 12/19/2015    Priority: Medium  . Insomnia 08/21/2015    Priority: Medium  . Hyperlipidemia 11/03/2007    Priority: Medium  . Essential hypertension 12/10/2006    Priority: Medium  . Eczema 05/21/2015    Priority: Low  . History of adenomatous polyp of colon 03/18/2017  . Viral URI with cough 01/17/2017   Past Surgical History:  Procedure Laterality Date  . MASTECTOMY    . TUBAL LIGATION      Family History  Problem Relation Age of Onset  . Stroke Mother   . Cancer Father    prostate  . Pancreatic cancer Sister     Medications- reviewed and updated Current Outpatient Prescriptions  Medication Sig Dispense Refill  . amLODipine (NORVASC) 5 MG tablet TAKE 1 TABLET (5 MG TOTAL) BY MOUTH DAILY. 90 tablet 3  . aspirin 325 MG tablet Take 325 mg by mouth daily.      [name] atenolol (TENORMIN) 100 MG tablet TAKE 1 TABLET BY MOUTH TWICE A DAY 180 tablet 3  . Calcium Carbonate-Vitamin D (CALCIUM PLUS VITAMIN D) 600-100 MG-UNIT CAPS Take by mouth 2 (two) times daily.      . fluocinonide cream (LIDEX) 0.05 % Reported on 09/28/2015  2  . glipiZIDE (GLUCOTROL XL) 10 MG 24 hr tablet Take 1 tablet (10 mg total) by mouth 2 (two) times daily. 90 tablet 3  . glucose blood (ONE TOUCH ULTRA TEST) test strip 1 each by Other route 2 (two) times daily. Use as instructed 100 each 3  . hydrOXYzine (ATARAX/VISTARIL) 10 MG tablet Take 10 mg by mouth as needed. Reported on 12/19/2015  2  . JANUVIA 100 MG tablet TAKE 1 TABLET BY MOUTH EVERY DAY 90 tablet 3  . losartan (COZAAR) 100 MG tablet Take 1 tablet (100 mg total) by mouth daily. 90 tablet 2  . metFORMIN (GLUCOPHAGE-XR) 500 MG 24 hr tablet Take  1 tablet (500 mg total) by mouth daily with breakfast. 90 tablet 1  . traZODone (DESYREL) 50 MG tablet Take 0.5-1 tablets (25-50 mg total) by mouth at bedtime as needed for sleep. 30 tablet 3  . triamcinolone cream (KENALOG) 0.1 % as needed. Reported on 09/28/2015     No current facility-administered medications for this visit.     Allergies-reviewed and updated Allergies  Allergen Reactions  . Ace Inhibitors Cough    ????  . Chlorthalidone     REACTION: unspecified  . Metformin     REACTION: gi side effects  . Penicillins     REACTION: urticaria (hives)  . Sulfamethoxazole     REACTION: questionable    Social History   Social History  . Marital status: Married    Spouse name: N/A  . Number of children: N/A  . Years of education: N/A   Social History Main Topics  . Smoking status:  Current Every Day Smoker    Packs/day: 0.10    Types: Cigarettes  . Smokeless tobacco: Never Used     Comment: 3 cigs a day :not smoking due to illness (01/17/17)  . Alcohol use 0.0 oz/week     Comment: a drink every now and again  . Drug use: No  . Sexual activity: Not Asked   Other Topics Concern  . None   Social History Narrative   Married (husband Lynann Bologna). 4 children. 11 grandkids. 1 greatgrandchild.       Still working as Radiation protection practitioner: playing cards, board games, cooking, entertaning    Objective: BP (!) 104/52 (BP Location: Right Arm, Patient Position: Sitting, Cuff Size: Large)   Pulse 71   Temp 98.1 F (36.7 C) (Oral)   Ht 5\' 2"  (1.575 m)   Wt 157 lb 3.2 oz (71.3 kg)   SpO2 99%   BMI 28.75 kg/m  Gen: NAD, resting comfortably HEENT: Mucous membranes are moist. Oropharynx normal Neck: no thyromegaly CV: RRR no murmurs rubs or gallops Lungs: CTAB no crackles, wheeze, rhonchi Abdomen: soft/nontender/nondistended/normal bowel sounds. No rebound or guarding.  Ext: no edema Skin: warm, dry Neuro: grossly normal, moves all extremities, PERRLA  Assessment/Plan:  71 y.o. female presenting for annual physical.  Health Maintenance counseling: 1. Anticipatory guidance: Patient counseled regarding regular dental exams -q6 months at least- sometimes q3, eye exams needs to get updated, wearing seatbelts.  2. Risk factor reduction:  Advised patient of need for regular exercise and diet rich and fruits and vegetables to reduce risk of heart attack and stroke. Exercise- moved clothes off home exercise equipment- now just hast to use it. Diet- has cut down on portion size and down 6 lbs Wt Readings from Last 3 Encounters:  03/18/17 157 lb 3.2 oz (71.3 kg)  01/17/17 158 lb 12 oz (72 kg)  11/21/16 163 lb 3.2 oz (74 kg)  3. Immunizations/screenings/ancillary studies- flu shot - plans to do in October or november Immunization History  Administered Date(s) Administered  .  Influenza Split 04/30/2012  . Influenza Whole 06/01/2007, 04/27/2008, 06/05/2009, 05/02/2010, 04/28/2011  . Influenza, High Dose Seasonal PF 03/14/2015, 04/04/2016  . Influenza,inj,Quad PF,6+ Mos 03/23/2013  . Influenza-Unspecified 04/11/2014  . Pneumococcal Conjugate-13 06/30/2014  . Pneumococcal Polysaccharide-23 03/21/2009, 08/21/2015  . Tdap 09/22/2011  . Zoster 03/19/2011  4. Cervical cancer screening- aged out 63. Breast cancer screening (history mastectomy 2009 left)-  breast exam declined- does all follow up with breast center- and mammogram 09/10/16 6. Colon cancer screening -  06/26/11 with 5 year follow up per Dr. Earlean Shawl due to adenoma history  Status of chronic or acute concerns   HTN- controlled on amlodipine 5mg  and atenolol 100mg , losartan 100mg   DM- reasonable control with a1c <7.5 on glipizide 10mg  BID , metformin XR 500mg  only d/t loose stools, januvia 100mg . Needs updated eye exam. Down 6 lbs.  Lab Results  Component Value Date   HGBA1C 7.3 (H) 11/21/2016   INsomnia- controlled with prn trazodone  Smoking- advised cessation  from 1-2 cigarettes a day. Down to 1 a day.   Fatty liver- advised weight loss, update LFTs. We have not used statin for Hyperlipidemia due to LFT elevation Lab Results  Component Value Date   ALT 26 11/21/2016   AST 52 (H) 11/21/2016   ALKPHOS 140 (H) 11/21/2016   BILITOT 0.8 11/21/2016   History CVA- on aspirin, minimal residual from stroke 2009- right shoulder droop  Eczema- prn triamcinolone  Thrombocytopenia- update CBC  Macrocytosis- likely due to smoking  4 months follow up  Labs:  Preventative health care  Controlled type 2 diabetes mellitus without complication, without long-term current use of insulin ([place]) - Plan: CBC, Comprehensive metabolic panel, Lipid panel, Hemoglobin A1c  Essential hypertension - Plan: CBC, Comprehensive metabolic panel  Hyperlipidemia, unspecified hyperlipidemia type - Plan: CBC, Comprehensive  metabolic panel, Lipid panel  BREAST CANCER, HX OF  Smoker  Fatty liver disease, nonalcoholic - Plan: Comprehensive metabolic panel  History of adenomatous polyp of colon - Plan: Ambulatory referral to Gastroenterology  Thrombocytopenia ([place]) - Plan: CBC  Return precautions advised.  Garret Reddish, MD

## 2017-03-18 NOTE — Patient Instructions (Addendum)
If you have had your eye exam within the last year, please sign release of information at check out desk. If you have not had an eye exam within a year, please get one at this time as this is important for your diabetes care  Call in for nurse visit in October or November for flu shot  Please stop by lab before you go

## 2017-03-22 ENCOUNTER — Other Ambulatory Visit: Payer: Self-pay | Admitting: Family Medicine

## 2017-03-25 ENCOUNTER — Other Ambulatory Visit: Payer: Self-pay | Admitting: Family Medicine

## 2017-04-10 ENCOUNTER — Ambulatory Visit: Payer: Medicare Other

## 2017-04-28 ENCOUNTER — Other Ambulatory Visit: Payer: Self-pay | Admitting: Family Medicine

## 2017-04-30 ENCOUNTER — Ambulatory Visit (INDEPENDENT_AMBULATORY_CARE_PROVIDER_SITE_OTHER): Payer: Medicare Other

## 2017-04-30 DIAGNOSIS — Z23 Encounter for immunization: Secondary | ICD-10-CM

## 2017-06-12 ENCOUNTER — Ambulatory Visit: Payer: Medicare Other | Admitting: Family Medicine

## 2017-06-12 ENCOUNTER — Telehealth: Payer: Self-pay | Admitting: Family Medicine

## 2017-06-12 ENCOUNTER — Encounter: Payer: Self-pay | Admitting: Family Medicine

## 2017-06-12 VITALS — BP 110/58 | HR 76 | Temp 98.2°F | Ht 62.0 in | Wt 161.1 lb

## 2017-06-12 DIAGNOSIS — E119 Type 2 diabetes mellitus without complications: Secondary | ICD-10-CM | POA: Diagnosis not present

## 2017-06-12 DIAGNOSIS — I10 Essential (primary) hypertension: Secondary | ICD-10-CM

## 2017-06-12 DIAGNOSIS — K719 Toxic liver disease, unspecified: Secondary | ICD-10-CM | POA: Insufficient documentation

## 2017-06-12 DIAGNOSIS — Z720 Tobacco use: Secondary | ICD-10-CM | POA: Diagnosis not present

## 2017-06-12 DIAGNOSIS — R600 Localized edema: Secondary | ICD-10-CM

## 2017-06-12 DIAGNOSIS — T466X5A Adverse effect of antihyperlipidemic and antiarteriosclerotic drugs, initial encounter: Secondary | ICD-10-CM | POA: Insufficient documentation

## 2017-06-12 DIAGNOSIS — Z5309 Procedure and treatment not carried out because of other contraindication: Secondary | ICD-10-CM | POA: Insufficient documentation

## 2017-06-12 NOTE — Telephone Encounter (Signed)
[employer] stating patient failing metric for statin use in diabetic Lab Results  Component Value Date   ALT 36 (H) 03/18/2017   AST 87 (H) 03/18/2017   ALKPHOS 130 (H) 03/18/2017   BILITOT 1.0 03/18/2017   Patient has had LFT elevation and thus is not a candidate for statin at present due to potential hepatotoxicity

## 2017-06-12 NOTE — Progress Notes (Signed)
Patient presents to clinic today to establish care.  SUBJECTIVE: Pt seen for f/u on HTN and DM.  Had a CPE in August 2018.  HTN: -Taking Norvasc 5 mg and losartan 100 mg daily -Patient does not check BP at home currently she needs a new battery for her blood pressure monitor -Patient denies headaches, chest pain, changes in vision -Patient has been eating more salt  DM type II: -Patient taking glipizide extended release 10 mg, Januvia 100 mg, metformin extended release 500 mg -Patient is not checking blood sugar at home.  Also needs a new battery for meter and does not like sticking herself as her fingers get sore -Patient endorses one episode of low blood sugar in the a.m.  EMS was called FSBS was 46.  Tobacco use: -Patient endorses smoking 2 cigarettes/day. -Patient states she has been trying to quit.  States her husband does anyone know she still smokes.  Lower extremity edema: -Times a few months or more -Patient endorses being on her feet/doing a lot of sitting. -Patient does have a small stool she will use to elevate her feet -Patient wearing compression socks. -States right leg is always worse than left.   Past Medical History:  Diagnosis Date  . Cancer [facility])    history of no adjunctive RX( no chemo no RT)  . Cerebrovascular accident ([place])   . COLONIC POLYPS, HX OF 12/10/2006   Qualifier: Diagnosis of  By: Leanne Chang MD, Bruce    . Diabetes mellitus    Type 2  . Diabetes mellitus type II, controlled ([place]) 12/10/2006   Poor control on Januvia 100mg  and glipizide 10mg  XL Lab Results  Component Value Date   HGBA1C 8.3* 11/02/2014       . Eczema   . Fatty liver disease, nonalcoholic 16/04/9603   Per Dr. Leanne Chang Lab Results  Component Value Date   ALT 31 11/02/2014   AST 57* 11/02/2014   ALKPHOS 104 11/02/2014   BILITOT 1.1 11/02/2014      . Hypertension     Past Surgical History:  Procedure Laterality Date  . CATARACT EXTRACTION Left   . MASTECTOMY    . TUBAL  LIGATION      Current Outpatient Medications on File Prior to Visit  Medication Sig Dispense Refill  . aspirin 325 MG tablet Take 325 mg by mouth daily.      [name] atenolol (TENORMIN) 100 MG tablet TAKE 1 TABLET BY MOUTH TWICE A DAY 180 tablet 3  . Calcium Carbonate-Vitamin D (CALCIUM PLUS VITAMIN D) 600-100 MG-UNIT CAPS Take by mouth 2 (two) times daily.      . fluocinonide cream (LIDEX) 0.05 % Reported on 09/28/2015  2  . glipiZIDE (GLUCOTROL XL) 10 MG 24 hr tablet TAKE 1 TABLET (10 MG TOTAL) BY MOUTH 2 (TWO) TIMES DAILY. 90 tablet 2  . hydrOXYzine (ATARAX/VISTARIL) 10 MG tablet Take 10 mg by mouth as needed. Reported on 12/19/2015  2  . JANUVIA 100 MG tablet TAKE 1 TABLET BY MOUTH EVERY DAY 90 tablet 3  . losartan (COZAAR) 100 MG tablet Take 1 tablet (100 mg total) by mouth daily. 90 tablet 2  . metFORMIN (GLUCOPHAGE-XR) 500 MG 24 hr tablet TAKE 1 TABLET (500 MG TOTAL) BY MOUTH DAILY WITH BREAKFAST. 90 tablet 1  . traZODone (DESYREL) 50 MG tablet Take 0.5-1 tablets (25-50 mg total) by mouth at bedtime as needed for sleep. 30 tablet 3  . triamcinolone cream (KENALOG) 0.1 % as needed. Reported on 09/28/2015    .  glucose blood (ONE TOUCH ULTRA TEST) test strip 1 each by Other route 2 (two) times daily. Use as instructed (Patient not taking: Reported on 06/12/2017) 100 each 3   No current facility-administered medications on file prior to visit.     Allergies  Allergen Reactions  . Ace Inhibitors Cough    ????  . Chlorthalidone     REACTION: unspecified  . Metformin     REACTION: gi side effects  . Penicillins     REACTION: urticaria (hives)  . Sulfamethoxazole     REACTION: questionable    Family History  Problem Relation Age of Onset  . Stroke Mother   . Cancer Father        prostate  . Pancreatic cancer Sister     Social History   Socioeconomic History  . Marital status: Married    Spouse name: Not on file  . Number of children: Not on file  . Years of education: Not on file   . Highest education level: Not on file  Social Needs  . Financial resource strain: Not on file  . Food insecurity - worry: Not on file  . Food insecurity - inability: Not on file  . Transportation needs - medical: Not on file  . Transportation needs - non-medical: Not on file  Occupational History  . Not on file  Tobacco Use  . Smoking status: Current Every Day Smoker    Packs/day: 0.10    Types: Cigarettes  . Smokeless tobacco: Never Used  . Tobacco comment: 3 cigs a day :not smoking due to illness (01/17/17)  Substance and Sexual Activity  . Alcohol use: Yes    Alcohol/week: 0.0 oz    Comment: a drink every now and again  . Drug use: No  . Sexual activity: Not on file  Other Topics Concern  . Not on file  Social History Narrative   Married (husband Lynann Bologna). 4 children. 11 grandkids. 1 greatgrandchild.       Still working as Radiation protection practitioner: playing cards, board games, cooking, entertaning    ROS General: Denies fever, chills, night sweats, changes in weight, changes in appetite HEENT: Denies headaches, ear pain, changes in vision, rhinorrhea, sore throat CV: Denies CP, palpitations, SOB, orthopnea Pulm: Denies SOB, cough, wheezing GI: Denies abdominal pain, nausea, vomiting, diarrhea, constipation GU: Denies dysuria, hematuria, frequency, vaginal discharge Msk: Denies muscle cramps, joint pains Neuro: Denies weakness, numbness, tingling Skin: Denies rashes, bruising.  + H/o eczema Psych: Denies depression, anxiety, hallucinations  BP (!) 110/58 (BP Location: Right Arm, Patient Position: Sitting, Cuff Size: Normal)   Pulse 76   Temp 98.2 F (36.8 C) (Oral)   Ht 5\' 2"  (1.575 m)   Wt 161 lb 1.6 oz (73.1 kg)   BMI 29.47 kg/m   Physical Exam Gen. Pleasant, well developed, well-nourished, in NAD HEENT - [place]/AT, PERRL, conjunctive clear, no scleral icterus, no nasal drainage, pharynx without erythema or exudate. Lungs: no use of accessory muscles, CTAB, no  wheezes, rales or rhonchi Cardiovascular: RRR, No r/g/m, 1+ pitting edema in LEs Abdomen: BS present, soft, nontender,nondistended Musculoskeletal: No deformities, moves all four extremities, no cyanosis or clubbing, normal tone Neuro:  A&Ox3, CN II-XII intact, normal gait Skin:  Warm, dry, intact, no lesions Psych: normal affect, mood appropriate  Recent Results (from the past 2160 hour(s))  CBC     Status: Abnormal   Collection Time: 03/18/17  8:51 AM  Result Value Ref Range  WBC 5.6 4.0 - 10.5 K/uL   RBC 3.32 (L) 3.87 - 5.11 Mil/uL   Platelets 175.0 150.0 - 400.0 K/uL   Hemoglobin 12.3 12.0 - 15.0 g/dL   HCT 37.3 36.0 - 46.0 %   MCV 112.4 Repeated and verified X2. (H) 78.0 - 100.0 fl   MCHC 33.0 30.0 - 36.0 g/dL   RDW 14.1 11.5 - 15.5 %  Comprehensive metabolic panel     Status: Abnormal   Collection Time: 03/18/17  8:51 AM  Result Value Ref Range   Sodium 138 135 - 145 mEq/L   Potassium 3.9 3.5 - 5.1 mEq/L   Chloride 99 96 - 112 mEq/L   CO2 28 19 - 32 mEq/L   Glucose, Bld 126 (H) 70 - 99 mg/dL   BUN 7 6 - 23 mg/dL   Creatinine, Ser 0.99 0.40 - 1.20 mg/dL   Total Bilirubin 1.0 0.2 - 1.2 mg/dL   Alkaline Phosphatase 130 (H) 39 - 117 U/L   AST 87 (H) 0 - 37 U/L   ALT 36 (H) 0 - 35 U/L   Total Protein 7.3 6.0 - 8.3 g/dL   Albumin 3.4 (L) 3.5 - 5.2 g/dL   Calcium 10.1 8.4 - 10.5 mg/dL   GFR 71.24 >60.00 mL/min  Lipid panel     Status: Abnormal   Collection Time: 03/18/17  8:51 AM  Result Value Ref Range   Cholesterol 146 0 - 200 mg/dL    Comment: ATP III Classification       Desirable:  < 200 mg/dL               Borderline High:  200 - 239 mg/dL          High:  > = 240 mg/dL   Triglycerides 154.0 (H) 0.0 - 149.0 mg/dL    Comment: Normal:  <150 mg/dLBorderline High:  150 - 199 mg/dL   HDL 49.80 >39.00 mg/dL   VLDL 30.8 0.0 - 40.0 mg/dL   LDL Cholesterol 65 0 - 99 mg/dL   Total CHOL/HDL Ratio 3     Comment:                Men          Women1/2 Average Risk     3.4           3.3Average Risk          5.0          4.42X Average Risk          9.6          7.13X Average Risk          15.0          11.0                       NonHDL 96.07     Comment: NOTE:  Non-HDL goal should be 30 mg/dL higher than patient's LDL goal (i.e. LDL goal of < 70 mg/dL, would have non-HDL goal of < 100 mg/dL)  Hemoglobin A1c     Status: Abnormal   Collection Time: 03/18/17  8:51 AM  Result Value Ref Range   Hgb A1c MFr Bld 6.8 (H) 4.6 - 6.5 %    Comment: Glycemic Control Guidelines for People with Diabetes:Non Diabetic:  <6%Goal of Therapy: <7%Additional Action Suggested:  >8%    BP Readings from Last 3 Encounters:  06/12/17 (!) 110/58  03/18/17 (!) 104/52  01/17/17  128/62    Assessment/Plan: Essential hypertension -Currently controlled -We will DC Norvasc 5 mg daily to see lower extremity edema improves -We will continue taking losartan 100 mg, atenolol 100 mg -Patient encouraged to obtain battery for a blood pressure cuff and to check daily.  Controlled type 2 diabetes mellitus without complication, without long-term current use of insulin ([facility]) -Currently at goal -Last hemoglobin A1c 03/18/17 was 6.8%. -Given handouts on signs of hypo-and hyper glycemia. -Patient encouraged to check fingerstick blood sugar in a.m. -Patient encouraged to get battery for glucometer.  Tobacco use -Smoking cessation counseling greater than 3 minutes, less than 10 minutes. -Patient smoking 2 cigarettes/day -Not ready to quit -Discussed that each visit  Lower extremity edema: -Ongoing concern -Discussed elevating feet while sitting and decreasing sodium intake -Also discussed Norvasc may be a contributor. -We will discontinue Norvasc 5 mg daily to see if lower extremity edema improves. -Patient to continue wearing compression stockings.    Follow-up in 1 month for BP and DM

## 2017-06-12 NOTE — Patient Instructions (Addendum)
DASH Eating Plan DASH stands for "Dietary Approaches to Stop Hypertension." The DASH eating plan is a healthy eating plan that has been shown to reduce high blood pressure (hypertension). It may also reduce your risk for type 2 diabetes, heart disease, and stroke. The DASH eating plan may also help with weight loss. What are tips for following this plan? General guidelines  Avoid eating more than 2,300 mg (milligrams) of salt (sodium) a day. If you have hypertension, you may need to reduce your sodium intake to 1,500 mg a day.  Limit alcohol intake to no more than 1 drink a day for nonpregnant women and 2 drinks a day for men. One drink equals 12 oz of beer, 5 oz of wine, or 1 oz of hard liquor.  Work with your health care provider to maintain a healthy body weight or to lose weight. Ask what an ideal weight is for you.  Get at least 30 minutes of exercise that causes your heart to beat faster (aerobic exercise) most days of the week. Activities may include walking, swimming, or biking.  Work with your health care provider or diet and nutrition specialist (dietitian) to adjust your eating plan to your individual calorie needs. Reading food labels  Check food labels for the amount of sodium per serving. Choose foods with less than 5 percent of the Daily Value of sodium. Generally, foods with less than 300 mg of sodium per serving fit into this eating plan.  To find whole grains, look for the word "whole" as the first word in the ingredient list. Shopping  Buy products labeled as "low-sodium" or "no salt added."  Buy fresh foods. Avoid canned foods and premade or frozen meals. Cooking  Avoid adding salt when cooking. Use salt-free seasonings or herbs instead of table salt or sea salt. Check with your health care provider or pharmacist before using salt substitutes.  Do not fry foods. Cook foods using healthy methods such as baking, boiling, grilling, and broiling instead.  Cook with  heart-healthy oils, such as olive, canola, soybean, or sunflower oil. Meal planning   Eat a balanced diet that includes: ? 5 or more servings of fruits and vegetables each day. At each meal, try to fill half of your plate with fruits and vegetables. ? Up to 6-8 servings of whole grains each day. ? Less than 6 oz of lean meat, poultry, or fish each day. A 3-oz serving of meat is about the same size as a deck of cards. One egg equals 1 oz. ? 2 servings of low-fat dairy each day. ? A serving of nuts, seeds, or beans 5 times each week. ? Heart-healthy fats. Healthy fats called Omega-3 fatty acids are found in foods such as flaxseeds and coldwater fish, like sardines, salmon, and mackerel.  Limit how much you eat of the following: ? Canned or prepackaged foods. ? Food that is high in trans fat, such as fried foods. ? Food that is high in saturated fat, such as fatty meat. ? Sweets, desserts, sugary drinks, and other foods with added sugar. ? Full-fat dairy products.  Do not salt foods before eating.  Try to eat at least 2 vegetarian meals each week.  Eat more home-cooked food and less restaurant, buffet, and fast food.  When eating at a restaurant, ask that your food be prepared with less salt or no salt, if possible. What foods are recommended? The items listed may not be a complete list. Talk with your dietitian about what   dietary choices are best for you. Grains Whole-grain or whole-wheat bread. Whole-grain or whole-wheat pasta. Brown rice. Oatmeal. Quinoa. Bulgur. Whole-grain and low-sodium cereals. Pita bread. Low-fat, low-sodium crackers. Whole-wheat flour tortillas. Vegetables Fresh or frozen vegetables (raw, steamed, roasted, or grilled). Low-sodium or reduced-sodium tomato and vegetable juice. Low-sodium or reduced-sodium tomato sauce and tomato paste. Low-sodium or reduced-sodium canned vegetables. Fruits All fresh, dried, or frozen fruit. Canned fruit in natural juice (without  added sugar). Meat and other protein foods Skinless chicken or turkey. Ground chicken or turkey. Pork with fat trimmed off. Fish and seafood. Egg whites. Dried beans, peas, or lentils. Unsalted nuts, nut butters, and seeds. Unsalted canned beans. Lean cuts of beef with fat trimmed off. Low-sodium, lean deli meat. Dairy Low-fat (1%) or fat-free (skim) milk. Fat-free, low-fat, or reduced-fat cheeses. Nonfat, low-sodium ricotta or cottage cheese. Low-fat or nonfat yogurt. Low-fat, low-sodium cheese. Fats and oils Soft margarine without trans fats. Vegetable oil. Low-fat, reduced-fat, or light mayonnaise and salad dressings (reduced-sodium). Canola, safflower, olive, soybean, and sunflower oils. Avocado. Seasoning and other foods Herbs. Spices. Seasoning mixes without salt. Unsalted popcorn and pretzels. Fat-free sweets. What foods are not recommended? The items listed may not be a complete list. Talk with your dietitian about what dietary choices are best for you. Grains Baked goods made with fat, such as croissants, muffins, or some breads. Dry pasta or rice meal packs. Vegetables Creamed or fried vegetables. Vegetables in a cheese sauce. Regular canned vegetables (not low-sodium or reduced-sodium). Regular canned tomato sauce and paste (not low-sodium or reduced-sodium). Regular tomato and vegetable juice (not low-sodium or reduced-sodium). Pickles. Olives. Fruits Canned fruit in a light or heavy syrup. Fried fruit. Fruit in cream or butter sauce. Meat and other protein foods Fatty cuts of meat. Ribs. Fried meat. Bacon. Sausage. Bologna and other processed lunch meats. Salami. Fatback. Hotdogs. Bratwurst. Salted nuts and seeds. Canned beans with added salt. Canned or smoked fish. Whole eggs or egg yolks. Chicken or turkey with skin. Dairy Whole or 2% milk, cream, and half-and-half. Whole or full-fat cream cheese. Whole-fat or sweetened yogurt. Full-fat cheese. Nondairy creamers. Whipped toppings.  Processed cheese and cheese spreads. Fats and oils Butter. Stick margarine. Lard. Shortening. Ghee. Bacon fat. Tropical oils, such as coconut, palm kernel, or palm oil. Seasoning and other foods Salted popcorn and pretzels. Onion salt, garlic salt, seasoned salt, table salt, and sea salt. Worcestershire sauce. Tartar sauce. Barbecue sauce. Teriyaki sauce. Soy sauce, including reduced-sodium. Steak sauce. Canned and packaged gravies. Fish sauce. Oyster sauce. Cocktail sauce. Horseradish that you find on the shelf. Ketchup. Mustard. Meat flavorings and tenderizers. Bouillon cubes. Hot sauce and Tabasco sauce. Premade or packaged marinades. Premade or packaged taco seasonings. Relishes. Regular salad dressings. Where to find more information:  National Heart, Lung, and Blood Institute: www.nhlbi.nih.gov  American Heart Association: www.heart.org Summary  The DASH eating plan is a healthy eating plan that has been shown to reduce high blood pressure (hypertension). It may also reduce your risk for type 2 diabetes, heart disease, and stroke.  With the DASH eating plan, you should limit salt (sodium) intake to 2,300 mg a day. If you have hypertension, you may need to reduce your sodium intake to 1,500 mg a day.  When on the DASH eating plan, aim to eat more fresh fruits and vegetables, whole grains, lean proteins, low-fat dairy, and heart-healthy fats.  Work with your health care provider or diet and nutrition specialist (dietitian) to adjust your eating plan to your individual   calorie needs. This information is not intended to replace advice given to you by your health care provider. Make sure you discuss any questions you have with your health care provider. Document Released: 07/03/2011 Document Revised: 07/07/2016 Document Reviewed: 07/07/2016 Elsevier Interactive Patient Education  2017 [employer].  How to Avoid Diabetes Mellitus Problems You can take action to prevent or slow down problems  that are caused by diabetes (diabetes mellitus). Following your diabetes plan and taking care of yourself can reduce your risk of serious or life-threatening complications. Manage your diabetes  Follow instructions from your health care providers about managing your diabetes. Your diabetes may be managed by a team of health care providers who can teach you how to care for yourself and can answer questions that you have.  Educate yourself about your condition so you can make healthy choices about eating and physical activity.  Check your blood sugar (glucose) levels as often as directed. Your health care provider will help you decide how often to check your blood glucose level depending on your treatment goals and how well you are meeting them.  Ask your health care provider if you should take low-dose aspirin daily and what dose is recommended for you. Taking low-dose aspirin daily is recommended to help prevent cardiovascular disease. Do not use nicotine or tobacco Do not use any products that contain nicotine or tobacco, such as cigarettes and e-cigarettes. If you need help quitting, ask your health care provider. Nicotine raises your risk for diabetes problems. If you quit using nicotine:  You will lower your risk for heart attack, stroke, nerve disease, and kidney disease.  Your cholesterol and blood pressure may improve.  Your blood circulation will improve.  Keep your blood pressure under control To control your blood pressure:  Follow instructions from your health care provider about meal planning, exercise, and medicines.  Make sure your health care provider checks your blood pressure at every medical visit.  A blood pressure reading consists of two numbers. Generally, the goal is to keep your top number (systolic pressure) at or below 130, and your bottom number (diastolic pressure) at or below 80. Your health care provider may recommend a lower target blood pressure. Your  individualized target blood pressure is determined based on:  Your age.  Your medicines.  How long you have had diabetes.  Any other medical conditions you have.  Keep your cholesterol under control To control your cholesterol:  Follow instructions from your health care provider about meal planning, exercise, and medicines.  Have your cholesterol checked at least once a year.  You may be prescribed medicine to lower cholesterol (statin). If you are not taking a statin, ask your health care provider if you should be.  Controlling your cholesterol may:  Help prevent heart disease and stroke. These are the most common health problems for people with diabetes.  Improve your blood flow.  Schedule and keep yearly physical exams and eye exams Your health care provider will tell you how often you need medical visits depending on your diabetes management plan. Keep all follow-up visits as directed. This is important so possible problems can be identified early and complications can be avoided or treated.  Every visit with your health care provider should include measuring your: ? Weight. ? Blood pressure. ? Blood glucose control.  Your A1c (hemoglobin A1c) level should be checked: ? At least 2 times a year, if you are meeting your treatment goals. ? 4 times a year, if you  are not meeting treatment goals or if your treatment goals have changed.  Your blood lipids (lipid profile) should be checked yearly. You should also be checked yearly for protein in your urine (urine microalbumin).  If you have type 1 diabetes, get an eye exam 3-5 years after you are diagnosed, and then once a year after your first exam.  If you have type 2 diabetes, get an eye exam as soon as you are diagnosed, and then once a year after your first exam.  Keep your vaccines current It is recommended that you receive:  pneumonia (pneumococcal) vaccine and a hepatitis B vaccine. If you are age 33 or older, you  may get the pneumonia vaccine as a series of two separate shots.  Ask your health care provider which other vaccines may be recommended. Take care of your feet Diabetes may cause you to have poor blood circulation to your legs and feet. Because of this, taking care of your feet is very important. Diabetes can cause:  The skin on the feet to get thinner, break more easily, and heal more slowly.  Nerve damage in your legs and feet, which results in decreased feeling. You may not notice minor injuries that could lead to serious problems.  To avoid foot problems:  Check your skin and feet every day for cuts, bruises, redness, blisters, or sores.  Schedule a foot exam with your health care provider once every year. This exam includes: ? Inspecting of the structure and skin of your feet. ? Checking the pulses and sensation in your feet.  Make sure that your health care provider performs a visual foot exam at every medical visit.  Take care of your teeth People with poorly controlled diabetes are more likely to have gum (periodontal) disease. Diabetes can make periodontal diseases harder to control. If not treated, periodontal diseases can lead to tooth loss. To prevent this:  Brush your teeth twice a day.  Floss at least once a day.  Visit your dentist 2 times a year.  Drink responsibly Limit alcohol intake to no more than 1 drink a day for nonpregnant women and 2 drinks a day for men. One drink equals 12 oz of beer, 5 oz of wine, or 1 oz of hard liquor. It is important to eat food when you drink alcohol to avoid low blood glucose (hypoglycemia). Avoid alcohol if you:  Have a history of alcohol abuse or dependence.  Are pregnant.  Have liver disease, pancreatitis, advanced neuropathy, or severe hypertriglyceridemia.  Lessen stress Living with diabetes can be stressful. When you are experiencing stress, your blood glucose may be affected in two ways:  Stress hormones may cause your  blood glucose to rise.  You may be distracted from taking good care of yourself.  Be aware of your stress level and make changes to help you manage challenging situations. To lower your stress levels:  Consider joining a support group.  Do planned relaxation or meditation.  Do a hobby that you enjoy.  Maintain healthy relationships.  Exercise regularly.  Work with your health care provider or a mental health professional.  Summary  You can take action to prevent or slow down problems that are caused by diabetes (diabetes mellitus). Following your diabetes plan and taking care of yourself can reduce your risk of serious or life-threatening complications.  Follow instructions from your health care providers about managing your diabetes. Your diabetes may be managed by a team of health care providers who can teach  you how to care for yourself and can answer questions that you have.  Your health care provider will tell you how often you need medical visits depending on your diabetes management plan. Keep all follow-up visits as directed. This is important so possible problems can be identified early and complications can be avoided or treated. This information is not intended to replace advice given to you by your health care provider. Make sure you discuss any questions you have with your health care provider. Document Released: 04/01/2011 Document Revised: 04/12/2016 Document Reviewed: 04/12/2016 Elsevier Interactive Patient Education  [employer].

## 2017-06-12 NOTE — Assessment & Plan Note (Signed)
[employer] stating patient failing metric for statin use in diabetic Lab Results  Component Value Date   ALT 36 (H) 03/18/2017   AST 87 (H) 03/18/2017   ALKPHOS 130 (H) 03/18/2017   BILITOT 1.0 03/18/2017   Patient has had LFT elevation and thus is not a candidate for statin at present due to potential hepatotoxicity

## 2017-07-16 ENCOUNTER — Ambulatory Visit: Payer: Medicare Other | Admitting: Family Medicine

## 2017-08-04 ENCOUNTER — Encounter: Payer: Self-pay | Admitting: Family Medicine

## 2017-08-04 ENCOUNTER — Ambulatory Visit: Payer: Medicare Other | Admitting: Family Medicine

## 2017-08-04 VITALS — BP 150/68 | HR 88 | Temp 99.3°F | Wt 167.9 lb

## 2017-08-04 DIAGNOSIS — R600 Localized edema: Secondary | ICD-10-CM

## 2017-08-04 MED ORDER — POTASSIUM CHLORIDE CRYS ER 20 MEQ PO TBCR: 20.0000 meq | EXTENDED_RELEASE_TABLET | Freq: Every day | ORAL | 0 refills | Status: DC

## 2017-08-04 MED ORDER — FUROSEMIDE 20 MG PO TABS: 20.0000 mg | ORAL_TABLET | Freq: Every day | ORAL | 0 refills | Status: DC

## 2017-08-04 NOTE — Progress Notes (Signed)
Subjective:    Patient ID: Anita Mccormick, female    DOB: [birth year], 72 y.o.   MRN: [id number]  Chief Complaint  Patient presents with  . Leg Swelling    both    HPI Patient was seen today for acute concern. B/l LE edema,  worse than normal, duration several days.  Pt endorses slight pain and tingling in legs.  Pt has been trying to wear compression hose, but notices the swelling above the point the compression hose stops.  Pt has been elevating legs at home and decreasing intake of sodium.  Pt is not on a diuretic.  She denies dizziness, SOB, CP, abdominal tightness.  Pt does endorse BP being elevated (systolic [id number]) at home but attributes it to her grandchildren.  Past Medical History:  Diagnosis Date  . Cancer [facility])    history of no adjunctive RX( no chemo no RT)  . Cerebrovascular accident ([place])   . COLONIC POLYPS, HX OF 12/10/2006   Qualifier: Diagnosis of  By: Leanne Chang MD, Bruce    . Diabetes mellitus    Type 2  . Diabetes mellitus type II, controlled ([place]) 12/10/2006   Poor control on Januvia 100mg  and glipizide 10mg  XL Lab Results  Component Value Date   HGBA1C 8.3* 11/02/2014       . Eczema   . Fatty liver disease, nonalcoholic 33/00/7622   Per Dr. Leanne Chang Lab Results  Component Value Date   ALT 31 11/02/2014   AST 57* 11/02/2014   ALKPHOS 104 11/02/2014   BILITOT 1.1 11/02/2014      . Hypertension     Allergies  Allergen Reactions  . Ace Inhibitors Cough    ????  . Chlorthalidone     REACTION: unspecified  . Metformin     REACTION: gi side effects  . Penicillins     REACTION: urticaria (hives)  . Sulfamethoxazole     REACTION: questionable    ROS General: Denies fever, chills, night sweats, changes in weight, changes in appetite HEENT: Denies headaches, ear pain, changes in vision, rhinorrhea, sore throat CV: Denies CP, palpitations, SOB, orthopnea  +LE edema Pulm: Denies SOB, cough, wheezing GI: Denies abdominal pain, nausea, vomiting, diarrhea, constipation GU:  Denies dysuria, hematuria, frequency, vaginal discharge Msk: Denies muscle cramps, joint pains Neuro: Denies weakness, numbness, tingling Skin: Denies rashes, bruising Psych: Denies depression, anxiety, hallucinations     Objective:    Blood pressure (!) 150/68, pulse 88, temperature 99.3 F (37.4 C), temperature source Oral, weight 167 lb 14.4 oz (76.2 kg).   Gen. Pleasant, well-nourished, in no distress, normal affect  HEENT: /AT, face symmetric, conjunctiva clear, no scleral icterus, PERRLA, EOMI, nares patent without drainage, pharynx without erythema or exudate. Neck: No JVD, no thyromegaly, no carotid bruits Lungs: no accessory muscle use, CTAB, no wheezes or rales Cardiovascular: RRR, no m/r/g.  B/l LEs with 2+ pitting edema in feet and ankles, 1+ to trace at knees. Abdomen: BS present, soft, NT/ND, no hepatosplenomegaly. Musculoskeletal: No deformities, no cyanosis or clubbing, normal tone Neuro:  A&Ox3, CN II-XII intact, normal gait Skin:  Warm, skin of lower extremities bilateral tight and shiny in appearance.   Wt Readings from Last 3 Encounters:  08/04/17 167 lb 14.4 oz (76.2 kg)  06/12/17 161 lb 1.6 oz (73.1 kg)  03/18/17 157 lb 3.2 oz (71.3 kg)    Lab Results  Component Value Date   WBC 5.6 03/18/2017   HGB 12.3 03/18/2017   HCT 37.3 03/18/2017   PLT  175.0 03/18/2017   GLUCOSE 126 (H) 03/18/2017   CHOL 146 03/18/2017   TRIG 154.0 (H) 03/18/2017   HDL 49.80 03/18/2017   LDLDIRECT 95.8 07/02/2006   LDLCALC 65 03/18/2017   ALT 36 (H) 03/18/2017   AST 87 (H) 03/18/2017   NA 138 03/18/2017   K 3.9 03/18/2017   CL 99 03/18/2017   CREATININE 0.99 03/18/2017   BUN 7 03/18/2017   CO2 28 03/18/2017   TSH 1.32 04/11/2014   INR 1.0 10/18/2007   HGBA1C 6.8 (H) 03/18/2017   MICROALBUR 0.2 04/11/2014    Assessment/Plan:  Lower extremity edema  -worse than typical edema. -Continue elevating extremities. -We will start Lasix 20 mg  -We will obtain labs.   Depending on labs will consider need for ECHO.  After edema stabilized will consider adding HCTZ or chlorthalidone to help with blood pressure and history of edema. - Plan: Brain Natriuretic Peptide, Comprehensive metabolic panel, Urinalysis, furosemide (LASIX) 20 MG tablet, potassium chloride SA (K-DUR,KLOR-CON) 20 MEQ tablet -Patient to follow-up in 2 days to reassess.  Patient given RTC or ED precautions.    [name], MD

## 2017-08-05 LAB — COMPREHENSIVE METABOLIC PANEL
ALT: 25 U/L (ref 0–35)
AST: 65 U/L — ABNORMAL HIGH (ref 0–37)
Albumin: 3 g/dL — ABNORMAL LOW (ref 3.5–5.2)
Alkaline Phosphatase: 150 U/L — ABNORMAL HIGH (ref 39–117)
BUN: 9 mg/dL (ref 6–23)
CO2: 26 mEq/L (ref 19–32)
Calcium: 9.3 mg/dL (ref 8.4–10.5)
Chloride: 104 mEq/L (ref 96–112)
Creatinine, Ser: 0.87 mg/dL (ref 0.40–1.20)
GFR: 82.6 mL/min (ref >60.00)
Glucose, Bld: 252 mg/dL — ABNORMAL HIGH (ref 70–99)
Potassium: 4.5 mEq/L (ref 3.5–5.1)
Sodium: 139 mEq/L (ref 135–145)
Total Bilirubin: 1 mg/dL (ref 0.2–1.2)
Total Protein: 6.6 g/dL (ref 6.0–8.3)

## 2017-08-05 LAB — URINALYSIS
Bilirubin Urine: NEGATIVE
Hgb urine dipstick: NEGATIVE
Ketones, ur: NEGATIVE
LEUKOCYTES UA: NEGATIVE
Nitrite: NEGATIVE
Specific Gravity, Urine: 1.015 (ref 1.000–1.030)
Total Protein, Urine: NEGATIVE
UROBILINOGEN UA: 0.2 (ref 0.0–1.0)
Urine Glucose: >=1000 — AB
pH: 7 (ref 5.0–8.0)

## 2017-08-05 LAB — BRAIN NATRIURETIC PEPTIDE: Pro B Natriuretic peptide (BNP): 257 pg/mL — ABNORMAL HIGH (ref 0.0–100.0)

## 2017-08-06 ENCOUNTER — Telehealth: Payer: Self-pay | Admitting: Emergency Medicine

## 2017-08-06 NOTE — Telephone Encounter (Signed)
Spoke with patient regarding leg swelling and patient states that she is doing good and the swelling has gone down. Explained to patient that if the swelling gets worse Dr. Volanda Napoleon would like her to go to the ED. Patient states that she has an appointment tomorrow with PCP and she will be in. Nothing further needed.

## 2017-08-07 ENCOUNTER — Encounter: Payer: Self-pay | Admitting: Family Medicine

## 2017-08-07 ENCOUNTER — Ambulatory Visit: Payer: Medicare Other | Admitting: Family Medicine

## 2017-08-07 VITALS — BP 110/80 | HR 68 | Temp 98.6°F | Wt 161.0 lb

## 2017-08-07 DIAGNOSIS — R600 Localized edema: Secondary | ICD-10-CM

## 2017-08-07 NOTE — Progress Notes (Signed)
Subjective:    Patient ID: Anita Mccormick, female    DOB: 25-Aug-1945, 72 y.o.   MRN: [id number]  No chief complaint on file.   HPI Patient was seen today for follow-up on bilateral lower extremity edema.  Patient was seen earlier this week for lower extremity edema.  She was started on Lasix 20 mg daily and K Dur 20 mEq daily.  Labs were obtained prior to starting this.  Since last visit patient states her lower extremity edema has been improving.  She states her legs do not feel as tight/heavy as they did on 08/04/17.  Patient denies headaches, dizziness, nausea vomiting, shortness of breath, chest pain, myalgias.  Patient states she does not want to have to go to the hospital.  Of note BMP obtained was elevated at 257.  Past Medical History:  Diagnosis Date  . Cancer [facility])    history of no adjunctive RX( no chemo no RT)  . Cerebrovascular accident ([place])   . COLONIC POLYPS, HX OF 12/10/2006   Qualifier: Diagnosis of  By: Leanne Chang MD, [place]    . Diabetes mellitus    Type 2  . Diabetes mellitus type II, controlled ([place]) 12/10/2006   Poor control on Januvia 100mg  and glipizide 10mg  XL Lab Results  Component Value Date   HGBA1C 8.3* 11/02/2014       . Eczema   . Fatty liver disease, nonalcoholic 57/32/2025   Per Dr. Leanne Chang Lab Results  Component Value Date   ALT 31 11/02/2014   AST 57* 11/02/2014   ALKPHOS 104 11/02/2014   BILITOT 1.1 11/02/2014      . Hypertension     Allergies  Allergen Reactions  . Ace Inhibitors Cough    ????  . Chlorthalidone     REACTION: unspecified  . Metformin     REACTION: gi side effects  . Penicillins     REACTION: urticaria (hives)  . Sulfamethoxazole     REACTION: questionable    ROS General: Denies fever, chills, night sweats, changes in weight, changes in appetite HEENT: Denies headaches, ear pain, changes in vision, rhinorrhea, sore throat CV: Denies CP, palpitations, SOB, orthopnea + lower extremity edema Pulm: Denies SOB, cough, wheezing GI:  Denies abdominal pain, nausea, vomiting, diarrhea, constipation GU: Denies dysuria, hematuria, frequency, vaginal discharge Msk: Denies muscle cramps, joint pains Neuro: Denies weakness, numbness, tingling Skin: Denies rashes, bruising Psych: Denies depression, anxiety, hallucinations     Objective:    Blood pressure 110/80, pulse 68, temperature 98.6 F (37 C), temperature source Oral, weight 161 lb (73 kg).   Gen. Pleasant, well-nourished, in no distress, normal affect   HEENT: [place]/AT, face symmetric, conjunctiva clear, no scleral icterus, PERRLA,  nares patent without drainage,  Neck: No JVD, no thyromegaly, no carotid bruits Lungs: no accessory muscle use, CTAB, no wheezes or rales Cardiovascular: RRR, no m/r/g, trace pitting edema at the knees and 1+ pitting edema at ankle/foot Neuro:  A&Ox3, CN II-XII intact, normal gait Skin:  Warm, no lesions/ rash   Wt Readings from Last 3 Encounters:  08/07/17 161 lb (73 kg)  08/04/17 167 lb 14.4 oz (76.2 kg)  06/12/17 161 lb 1.6 oz (73.1 kg)    Lab Results  Component Value Date   WBC 5.6 03/18/2017   HGB 12.3 03/18/2017   HCT 37.3 03/18/2017   PLT 175.0 03/18/2017   GLUCOSE 252 (H) 08/04/2017   CHOL 146 03/18/2017   TRIG 154.0 (H) 03/18/2017   HDL 49.80 03/18/2017  LDLDIRECT 95.8 07/02/2006   LDLCALC 65 03/18/2017   ALT 25 08/04/2017   AST 65 (H) 08/04/2017   NA 139 08/04/2017   K 4.5 08/04/2017   CL 104 08/04/2017   CREATININE 0.87 08/04/2017   BUN 9 08/04/2017   CO2 26 08/04/2017   TSH 1.32 04/11/2014   INR 1.0 10/18/2007   HGBA1C 6.8 (H) 03/18/2017   MICROALBUR 0.2 04/11/2014    Assessment/Plan:  Bilateral lower extremity edema  -Continue home management.  Patient given strict precautions but would like to avoid the ED if possible. -We will obtain repeat stat BMP and CMP -If creatinine and potassium appropriate we will continue diuresis at home. -Given elevated BNP will order echo -Patient to follow-up next  Monday or Tuesday. - Plan: ECHOCARDIOGRAM COMPLETE, Comprehensive metabolic panel, Brain natriuretic peptide, CANCELED: Comprehensive metabolic panel, CANCELED: Brain Natriuretic Peptide  [name], MD

## 2017-08-07 NOTE — Patient Instructions (Addendum)
Edema Edema is an abnormal buildup of fluids in your bodytissues. Edema is somewhatdependent on gravity to pull the fluid to the lowest place in your body. That makes the condition more common in the legs and thighs (lower extremities). Painless swelling of the feet and ankles is common and becomes more likely as you get older. It is also common in looser tissues, like around your eyes. When the affected area is squeezed, the fluid may move out of that spot and leave a dent for a few moments. This dent is called pitting. What are the causes? There are many possible causes of edema. Eating too much salt and being on your feet or sitting for a long time can cause edema in your legs and ankles. Hot weather may make edema worse. Common medical causes of edema include:  Heart failure.  Liver disease.  Kidney disease.  Weak blood vessels in your legs.  Cancer.  An injury.  Pregnancy.  Some medications.  Obesity.  What are the signs or symptoms? Edema is usually painless.Your skin may look swollen or shiny. How is this diagnosed? Your health care provider may be able to diagnose edema by asking about your medical history and doing a physical exam. You may need to have tests such as X-rays, an electrocardiogram, or blood tests to check for medical conditions that may cause edema. How is this treated? Edema treatment depends on the cause. If you have heart, liver, or kidney disease, you need the treatment appropriate for these conditions. General treatment may include:  Elevation of the affected body part above the level of your heart.  Compression of the affected body part. Pressure from elastic bandages or support stockings squeezes the tissues and forces fluid back into the blood vessels. This keeps fluid from entering the tissues.  Restriction of fluid and salt intake.  Use of a water pill (diuretic). These medications are appropriate only for some types of edema. They pull fluid  out of your body and make you urinate more often. This gets rid of fluid and reduces swelling, but diuretics can have side effects. Only use diuretics as directed by your health care provider.  Follow these instructions at home:  Keep the affected body part above the level of your heart when you are lying down.  Do not sit still or stand for prolonged periods.  Do not put anything directly under your knees when lying down.  Do not wear constricting clothing or garters on your upper legs.  Exercise your legs to work the fluid back into your blood vessels. This may help the swelling go down.  Wear elastic bandages or support stockings to reduce ankle swelling as directed by your health care provider.  Eat a low-salt diet to reduce fluid if your health care provider recommends it.  Only take medicines as directed by your health care provider. Contact a health care provider if:  Your edema is not responding to treatment.  You have heart, liver, or kidney disease and notice symptoms of edema.  You have edema in your legs that does not improve after elevating them.  You have sudden and unexplained weight gain. Get help right away if:  You develop shortness of breath or chest pain.  You cannot breathe when you lie down.  You develop pain, redness, or warmth in the swollen areas.  You have heart, liver, or kidney disease and suddenly get edema.  You have a fever and your symptoms suddenly get worse. This information is   not intended to replace advice given to you by your health care provider. Make sure you discuss any questions you have with your health care provider. Document Released: 07/14/2005 Document Revised: 12/20/2015 Document Reviewed: 05/06/2013 Elsevier Interactive Patient Education  2017 Elsevier Inc.  

## 2017-08-08 ENCOUNTER — Other Ambulatory Visit: Payer: Self-pay | Admitting: Family Medicine

## 2017-08-08 DIAGNOSIS — R600 Localized edema: Secondary | ICD-10-CM

## 2017-08-08 LAB — COMPREHENSIVE METABOLIC PANEL
AG Ratio: 0.9 (calc) — ABNORMAL LOW (ref 1.0–2.5)
ALBUMIN MSPROF: 3.5 g/dL — AB (ref 3.6–5.1)
ALT: 25 U/L (ref 6–29)
AST: 60 U/L — ABNORMAL HIGH (ref 10–35)
Alkaline phosphatase (APISO): 192 U/L — ABNORMAL HIGH (ref 33–130)
BUN: 12 mg/dL (ref 7–25)
CALCIUM: 9.6 mg/dL (ref 8.6–10.4)
CO2: 28 mmol/L (ref 20–32)
CREATININE: 0.91 mg/dL (ref 0.60–0.93)
Chloride: 102 mmol/L (ref 98–110)
GLUCOSE: 248 mg/dL — AB (ref 65–99)
Globulin: 4 g/dL (calc) — ABNORMAL HIGH (ref 1.9–3.7)
POTASSIUM: 4.4 mmol/L (ref 3.5–5.3)
SODIUM: 137 mmol/L (ref 135–146)
TOTAL PROTEIN: 7.5 g/dL (ref 6.1–8.1)
Total Bilirubin: 1 mg/dL (ref 0.2–1.2)

## 2017-08-08 LAB — EXTRA LAV TOP TUBE

## 2017-08-08 LAB — BRAIN NATRIURETIC PEPTIDE: BRAIN NATRIURETIC PEPTIDE: 160 pg/mL — AB (ref <100)

## 2017-08-08 MED ORDER — FUROSEMIDE 20 MG PO TABS: 20.0000 mg | ORAL_TABLET | Freq: Every day | ORAL | 0 refills | Status: DC

## 2017-08-08 MED ORDER — POTASSIUM CHLORIDE CRYS ER 20 MEQ PO TBCR: 20.0000 meq | EXTENDED_RELEASE_TABLET | Freq: Every day | ORAL | 0 refills | Status: DC

## 2017-08-08 NOTE — Progress Notes (Signed)
Patient called.  Patient aware of results from [facility].  As creatinine is still within pt's normal range will continue with lasix 20 mg for 3 more days (takes last dose on Monday). Pt is to continue elevating her legs/wearing compression hose.  Pt has an appt on Thursday, however will attempt to have this appt moved up to earlier in the wk.    Also discussed pt's h/o NAFLD.  Wt loss advised.  Will continue to monitor.   Pt given ED precautions if feeling SOB, CP, increased LE edema, etc.

## 2017-08-11 ENCOUNTER — Ambulatory Visit (INDEPENDENT_AMBULATORY_CARE_PROVIDER_SITE_OTHER): Payer: Medicare Other | Admitting: Family Medicine

## 2017-08-11 ENCOUNTER — Other Ambulatory Visit: Payer: Self-pay

## 2017-08-11 ENCOUNTER — Ambulatory Visit (HOSPITAL_COMMUNITY): Payer: Medicare Other | Attending: Cardiology

## 2017-08-11 ENCOUNTER — Encounter: Payer: Self-pay | Admitting: Family Medicine

## 2017-08-11 VITALS — BP 112/50 | HR 85 | Temp 98.1°F | Wt 161.9 lb

## 2017-08-11 DIAGNOSIS — R600 Localized edema: Secondary | ICD-10-CM

## 2017-08-11 DIAGNOSIS — I5031 Acute diastolic (congestive) heart failure: Secondary | ICD-10-CM | POA: Diagnosis not present

## 2017-08-11 DIAGNOSIS — I5030 Unspecified diastolic (congestive) heart failure: Secondary | ICD-10-CM | POA: Diagnosis not present

## 2017-08-11 DIAGNOSIS — I420 Dilated cardiomyopathy: Secondary | ICD-10-CM | POA: Diagnosis not present

## 2017-08-11 NOTE — Progress Notes (Signed)
Subjective:    Patient ID: Anita Mccormick, female    DOB: April 02, 1946, 72 y.o.   MRN: [id number]  No chief complaint on file.   HPI Patient was seen today for f/u on b/l LE edema.  Pt states she is continuing to improve.  She still have LE edema but not as much as before.  Pt had an ECHO done this am.  Pt took a dose of lasix and K-dur this afternoon.  Pt denies SOB, CP, HAs, abdominal swelling.  Pt states she has not really been checking her bp at home like she should.  Past Medical History:  Diagnosis Date  . Cancer [facility])    history of no adjunctive RX( no chemo no RT)  . Cerebrovascular accident ([place])   . COLONIC POLYPS, HX OF 12/10/2006   Qualifier: Diagnosis of  By: Leanne Chang MD, Bruce    . Diabetes mellitus    Type 2  . Diabetes mellitus type II, controlled ([place]) 12/10/2006   Poor control on Januvia 100mg  and glipizide 10mg  XL Lab Results  Component Value Date   HGBA1C 8.3* 11/02/2014       . Eczema   . Fatty liver disease, nonalcoholic 09/73/5329   Per Dr. Leanne Chang Lab Results  Component Value Date   ALT 31 11/02/2014   AST 57* 11/02/2014   ALKPHOS 104 11/02/2014   BILITOT 1.1 11/02/2014      . Hypertension     Allergies  Allergen Reactions  . Ace Inhibitors Cough    ????  . Chlorthalidone     REACTION: unspecified  . Metformin     REACTION: gi side effects  . Penicillins     REACTION: urticaria (hives)  . Sulfamethoxazole     REACTION: questionable    ROS General: Denies fever, chills, night sweats, changes in weight, changes in appetite HEENT: Denies headaches, ear pain, changes in vision, rhinorrhea, sore throat CV: Denies CP, palpitations, SOB, orthopnea Pulm: Denies SOB, cough, wheezing GI: Denies abdominal pain, nausea, vomiting, diarrhea, constipation GU: Denies dysuria, hematuria, frequency, vaginal discharge Msk: Denies muscle cramps, joint pains   +b/l LE edema Neuro: Denies weakness, numbness, tingling Skin: Denies rashes, bruising Psych: Denies depression,  anxiety, hallucinations     Objective:    Blood pressure (!) 112/50, pulse 85, temperature 98.1 F (36.7 C), temperature source Oral, weight 161 lb 14.4 oz (73.4 kg).   Gen. Pleasant, well-nourished, in no distress, normal affect   HEENT: [place]/AT, face symmetric, conjunctiva clear, no scleral icterus, PERRLA, EOMI, nares patent without drainage, pharynx without erythema or exudate. Neck: No JVD, no thyromegaly, no carotid bruits Lungs: no accessory muscle use, CTAB, no wheezes or rales Cardiovascular: RRR, no m/r/g,  1 +pitting edema in LEs b/l below knee. Neuro:  A&Ox3, CN II-XII intact, normal gait   Wt Readings from Last 3 Encounters:  08/07/17 161 lb (73 kg)  08/04/17 167 lb 14.4 oz (76.2 kg)  06/12/17 161 lb 1.6 oz (73.1 kg)    Lab Results  Component Value Date   WBC 5.6 03/18/2017   HGB 12.3 03/18/2017   HCT 37.3 03/18/2017   PLT 175.0 03/18/2017   GLUCOSE 248 (H) 08/07/2017   CHOL 146 03/18/2017   TRIG 154.0 (H) 03/18/2017   HDL 49.80 03/18/2017   LDLDIRECT 95.8 07/02/2006   LDLCALC 65 03/18/2017   ALT 25 08/07/2017   AST 60 (H) 08/07/2017   NA 137 08/07/2017   K 4.4 08/07/2017   CL 102 08/07/2017   CREATININE  0.91 08/07/2017   BUN 12 08/07/2017   CO2 28 08/07/2017   TSH 1.32 04/11/2014   INR 1.0 10/18/2007   HGBA1C 6.8 (H) 03/18/2017   MICROALBUR 0.2 04/11/2014    Assessment/Plan:  Bilateral lower extremity edema -Will recheck BMP to assess K+ and creatinine. -Given continued edema and ECHO results will refer to Cardiology. -No medications added to pt's regimen this visit given hypotension. -Pt advised to check bp at home -Will make med adjustments based on results of labs today.  If creatinine elevated above baseline will d/c lasix daily. - Plan: Ambulatory referral to Cardiology, Basic metabolic panel, Brain Natriuretic Peptide, CBC with Differential/Platelet  Acute diastolic heart failure (HCC)  -BNP continues to improve on lasix 20 mg  daily -Reviewed ECHO results with pt.  EF 49-67% Grade I diastolic dysfunction, abnormal  LV relaxation and mild LVH. - Plan: Ambulatory referral to Cardiology -Pt given RTC or ED precautions including SOB or increased edema.  F/u in 1 wk.   [name], MD

## 2017-08-11 NOTE — Patient Instructions (Addendum)
Heart Failure Heart failure is a condition in which the heart has trouble pumping blood because it has become weak or stiff. This means that the heart does not pump blood efficiently for the body to work well. For some people with heart failure, fluid may back up into the lungs and there may be swelling (edema) in the lower legs. Heart failure is usually a long-term (chronic) condition. It is important for you to take good care of yourself and follow the treatment plan from your health care provider. What are the causes? This condition is caused by some health problems, including:  High blood pressure (hypertension). Hypertension causes the heart muscle to work harder than normal. High blood pressure eventually causes the heart to become stiff and weak.  Coronary artery disease (CAD). CAD is the buildup of cholesterol and fat (plaques) in the arteries of the heart.  Heart attack (myocardial infarction). Injured tissue, which is caused by the heart attack, does not contract as well and the heart's ability to pump blood is weakened.  Abnormal heart valves. When the heart valves do not open and close properly, the heart muscle must pump harder to keep the blood flowing.  Heart muscle disease (cardiomyopathy or myocarditis). Heart muscle disease is damage to the heart muscle from a variety of causes, such as drug or alcohol abuse, infections, or unknown causes. These can increase the risk of heart failure.  Lung disease. When the lungs do not work properly, the heart must work harder.  What increases the risk? Risk of heart failure increases as a person ages. This condition is also more likely to develop in people who:  Are overweight.  Are female.  Smoke or chew tobacco.  Abuse alcohol or illegal drugs.  Have taken medicines that can damage the heart, such as chemotherapy drugs.  Have diabetes. ? High blood sugar (glucose) is associated with high fat (lipid) levels in the blood. ? Diabetes  can also damage tiny blood vessels that carry nutrients to the heart muscle.  Have abnormal heart rhythms.  Have thyroid problems.  Have low blood counts (anemia).  What are the signs or symptoms? Symptoms of this condition include:  Shortness of breath with activity, such as when climbing stairs.  Persistent cough.  Swelling of the feet, ankles, legs, or abdomen.  Unexplained weight gain.  Difficulty breathing when lying flat (orthopnea).  Waking from sleep because of the need to sit up and get more air.  Rapid heartbeat.  Fatigue and loss of energy.  Feeling light-headed, dizzy, or close to fainting.  Loss of appetite.  Nausea.  Increased urination during the night (nocturia).  Confusion.  How is this diagnosed? This condition is diagnosed based on:  Medical history, symptoms, and a physical exam.  Diagnostic tests, which may include: ? Echocardiogram. ? Electrocardiogram (ECG). ? Chest X-ray. ? Blood tests. ? Exercise stress test. ? Radionuclide scans. ? Cardiac catheterization and angiogram.  How is this treated? Treatment for this condition is aimed at managing the symptoms of heart failure. Medicines, behavioral changes, or other treatments may be necessary to treat heart failure. Medicines These may include:  Angiotensin-converting enzyme (ACE) inhibitors. This type of medicine blocks the effects of a blood protein called angiotensin-converting enzyme. ACE inhibitors relax (dilate) the blood vessels and help to lower blood pressure.  Angiotensin receptor blockers (ARBs). This type of medicine blocks the actions of a blood protein called angiotensin. ARBs dilate the blood vessels and help to lower blood pressure.  Water   pills (diuretics). Diuretics cause the kidneys to remove salt and water from the blood. The extra fluid is removed through urination, leaving a lower volume of blood that the heart has to pump.  Beta blockers. These improve heart  muscle strength and they prevent the heart from beating too quickly.  Digoxin. This increases the force of the heartbeat.  Healthy behavior changes These may include:  Reaching and maintaining a healthy weight.  Stopping smoking or chewing tobacco.  Eating heart-healthy foods.  Limiting or avoiding alcohol.  Stopping use of street drugs (illegal drugs).  Physical activity.  Other treatments These may include:  Surgery to open blocked coronary arteries or repair damaged heart valves.  Placement of a biventricular pacemaker to improve heart muscle function (cardiac resynchronization therapy). This device paces both the right ventricle and left ventricle.  Placement of a device to treat serious abnormal heart rhythms (implantable cardioverter defibrillator, or ICD).  Placement of a device to improve the pumping ability of the heart (left ventricular assist device, or LVAD).  Heart transplant. This can cure heart failure, and it is considered for certain patients who do not improve with other therapies.  Follow these instructions at home: Medicines  Take over-the-counter and prescription medicines only as told by your health care provider. Medicines are important in reducing the workload of your heart, slowing the progression of heart failure, and improving your symptoms. ? Do not stop taking your medicine unless your health care provider told you to do that. ? Do not skip any dose of medicine. ? Refill your prescriptions before you run out of medicine. You need your medicines every day. Eating and drinking   Eat heart-healthy foods. Talk with a dietitian to make an eating plan that is right for you. ? Choose foods that contain no trans fat and are low in saturated fat and cholesterol. Healthy choices include fresh or frozen fruits and vegetables, fish, lean meats, legumes, fat-free or low-fat dairy products, and whole-grain or high-fiber foods. ? Limit salt (sodium) if  directed by your health care provider. Sodium restriction may reduce symptoms of heart failure. Ask a dietitian to recommend heart-healthy seasonings. ? Use healthy cooking methods instead of frying. Healthy methods include roasting, grilling, broiling, baking, poaching, steaming, and stir-frying.  Limit your fluid intake if directed by your health care provider. Fluid restriction may reduce symptoms of heart failure. Lifestyle  Stop smoking or using chewing tobacco. Nicotine and tobacco can damage your heart and your blood vessels. Do not use nicotine gum or patches before talking to your health care provider.  Limit alcohol intake to no more than 1 drink per day for non-pregnant women and 2 drinks per day for men. One drink equals 12 oz of beer, 5 oz of wine, or 1 oz of hard liquor. ? Drinking more than that is harmful to your heart. Tell your health care provider if you drink alcohol several times a week. ? Talk with your health care provider about whether any level of alcohol use is safe for you. ? If your heart has already been damaged by alcohol or you have severe heart failure, drinking alcohol should be stopped completely.  Stop use of illegal drugs.  Lose weight if directed by your health care provider. Weight loss may reduce symptoms of heart failure.  Do moderate physical activity if directed by your health care provider. People who are elderly and people with severe heart failure should consult with a health care provider for physical activity recommendations.   Monitor important information  Weigh yourself every day. Keeping track of your weight daily helps you to notice excess fluid sooner. ? Weigh yourself every morning after you urinate and before you eat breakfast. ? Wear the same amount of clothing each time you weigh yourself. ? Record your daily weight. Provide your health care provider with your weight record.  Monitor and record your blood pressure as told by your health  care provider.  Check your pulse as told by your health care provider. Dealing with extreme temperatures  If the weather is extremely hot: ? Avoid vigorous physical activity. ? Use air conditioning or fans or seek a cooler location. ? Avoid caffeine and alcohol. ? Wear loose-fitting, lightweight, and light-colored clothing.  If the weather is extremely cold: ? Avoid vigorous physical activity. ? Layer your clothes. ? Wear mittens or gloves, a hat, and a scarf when you go outside. ? Avoid alcohol. General instructions  Manage other health conditions such as hypertension, diabetes, thyroid disease, or abnormal heart rhythms as told by your health care provider.  Learn to manage stress. If you need help to do this, ask your health care provider.  Plan rest periods when fatigued.  Get ongoing education and support as needed.  Participate in or seek rehabilitation as needed to maintain or improve independence and quality of life.  Stay up to date with immunizations. Keeping current on pneumococcal and influenza immunizations is especially important to prevent respiratory infections.  Keep all follow-up visits as told by your health care provider. This is important. Contact a health care provider if:  You have a rapid weight gain.  You have increasing shortness of breath that is unusual for you.  You are unable to participate in your usual physical activities.  You tire easily.  You cough more than normal, especially with physical activity.  You have any swelling or more swelling in areas such as your hands, feet, ankles, or abdomen.  You are unable to sleep because it is hard to breathe.  You feel like your heart is beating quickly (palpitations).  You become dizzy or light-headed when you stand up. Get help right away if:  You have difficulty breathing.  You notice or your family notices a change in your awareness, such as having trouble staying awake or having  difficulty with concentration.  You have pain or discomfort in your chest.  You have an episode of fainting (syncope). This information is not intended to replace advice given to you by your health care provider. Make sure you discuss any questions you have with your health care provider. Document Released: 07/14/2005 Document Revised: 03/18/2016 Document Reviewed: 02/06/2016 Elsevier Interactive Patient Education  2018 Elsevier Inc.  

## 2017-08-12 LAB — CBC WITH DIFFERENTIAL/PLATELET
Basophils Absolute: 0 10*3/uL (ref 0.0–0.1)
Basophils Relative: 0.9 % (ref 0.0–3.0)
EOS PCT: 6 % — AB (ref 0.0–5.0)
Eosinophils Absolute: 0.3 10*3/uL (ref 0.0–0.7)
HCT: 25.7 % — ABNORMAL LOW (ref 36.0–46.0)
Hemoglobin: 8.1 g/dL — ABNORMAL LOW (ref 12.0–15.0)
Lymphocytes Relative: 18 % (ref 12.0–46.0)
Lymphs Abs: 0.9 10*3/uL (ref 0.7–4.0)
MCHC: 31.6 g/dL (ref 30.0–36.0)
MCV: 101.6 fl — AB (ref 78.0–100.0)
MONO ABS: 0.8 10*3/uL (ref 0.1–1.0)
Monocytes Relative: 15.5 % — ABNORMAL HIGH (ref 3.0–12.0)
NEUTROS ABS: 3 10*3/uL (ref 1.4–7.7)
NEUTROS PCT: 59.6 % (ref 43.0–77.0)
PLATELETS: 137 10*3/uL — AB (ref 150.0–400.0)
RDW: 17.5 % — AB (ref 11.5–15.5)
WBC: 5 10*3/uL (ref 4.0–10.5)

## 2017-08-12 LAB — BASIC METABOLIC PANEL
BUN: 13 mg/dL (ref 6–23)
CHLORIDE: 99 meq/L (ref 96–112)
CO2: 27 mEq/L (ref 19–32)
CREATININE: 1.16 mg/dL (ref 0.40–1.20)
Calcium: 9.2 mg/dL (ref 8.4–10.5)
GFR: 59.26 mL/min — ABNORMAL LOW (ref >60.00)
GLUCOSE: 280 mg/dL — AB (ref 70–99)
Potassium: 4.9 mEq/L (ref 3.5–5.1)
Sodium: 134 mEq/L — ABNORMAL LOW (ref 135–145)

## 2017-08-12 LAB — BRAIN NATRIURETIC PEPTIDE: PRO B NATRI PEPTIDE: 137 pg/mL — AB (ref 0.0–100.0)

## 2017-08-13 ENCOUNTER — Telehealth: Payer: Self-pay | Admitting: Emergency Medicine

## 2017-08-13 ENCOUNTER — Ambulatory Visit: Payer: Medicare Other | Admitting: Family Medicine

## 2017-08-13 NOTE — Telephone Encounter (Signed)
Spoke with patient regarding appointment being schedule with  Cardiology Monday morning at 9:40am with Domenick Gong. Patient was given address, time, and phone number. Nothing further needed.

## 2017-08-14 ENCOUNTER — Encounter: Payer: Self-pay | Admitting: *Deleted

## 2017-08-14 ENCOUNTER — Telehealth: Payer: Self-pay | Admitting: Family Medicine

## 2017-08-14 NOTE — Telephone Encounter (Signed)
Copied from [place] [id number]. Topic: Inquiry >> Aug 14, 2017  3:31 PM Oliver Pila B wrote: Reason for CRM: pt called and asked to be called b/c she wanted to be informed about the appt she has on Monday w/ a specialist and wants her daughter informed about it as well

## 2017-08-14 NOTE — Telephone Encounter (Signed)
Spoke with patient and she wanted to know what was going to happen at her appointment with Beckie Busing Monday. I explain to the patient that I wasn't sure and that NP would break everything down and tell her what was going to happen. Patient states she wants to add her daughter to her DPR and she will come in  Monday to fill out the paperwork.

## 2017-08-17 ENCOUNTER — Ambulatory Visit (INDEPENDENT_AMBULATORY_CARE_PROVIDER_SITE_OTHER): Payer: Medicare Other | Admitting: Adult Health

## 2017-08-17 ENCOUNTER — Encounter: Payer: Self-pay | Admitting: Adult Health

## 2017-08-17 VITALS — BP 98/50 | HR 78 | Ht 62.0 in | Wt 160.2 lb

## 2017-08-17 DIAGNOSIS — R0600 Dyspnea, unspecified: Secondary | ICD-10-CM | POA: Diagnosis not present

## 2017-08-17 DIAGNOSIS — R0609 Other forms of dyspnea: Secondary | ICD-10-CM

## 2017-08-17 DIAGNOSIS — R0989 Other specified symptoms and signs involving the circulatory and respiratory systems: Secondary | ICD-10-CM

## 2017-08-17 DIAGNOSIS — M79606 Pain in leg, unspecified: Secondary | ICD-10-CM | POA: Diagnosis not present

## 2017-08-17 NOTE — Patient Instructions (Signed)
Medication Instructions:  HOLD LASIX FOR NOW  If you need a refill on your cardiac medications before your next appointment, please call your pharmacy.  Testing/Procedures: Your physician has requested that you have a lexiscan myoview. Carlton Adam is a prescription medication used in a cardiac nuclear stress test. ... Carlton Adam is given by IV in preparation for a myocardial perfusion imaging (MPI) test.   For further information please visit [email or web address]. Please follow instruction sheet, as given.  Your physician has requested that you have an bilateral ankle brachial index (ABI). During this test an ultrasound and blood pressure cuff are used to evaluate the arteries that supply the arms and legs with blood. Allow thirty minutes for this exam. There are no restrictions or special instructions.  Follow-Up: Your physician wants you to follow-up in: 1 [place].   Thank you for choosing [facility] at [facility]!!

## 2017-08-17 NOTE — Progress Notes (Signed)
Cardiology Office Note   Date:  08/17/2017   ID:  Anita Mccormick, DOB 1946-06-21, MRN [id number]  PCP:  Billie Ruddy, MD  Cardiologist:  New Established with Dr. Martinique   Chief Complaint  Patient presents with  . Edema  . Shortness of Breath  . Claudication     History of Present Illness: Anita Mccormick is a 72 y.o. female who presents for evaluation of CHF, with chronic lower extremity edema, abdominal distention.  At the request of Dr. [name].  The patient has a history of CVA, hypertension, type 2 diabetes, eczema, she is allergic to ACE inhibitors, chlorthalidone, metformin, penicillins, and sulfa.  The patient did have an echocardiogram on 08/11/2017 which revealed normal LV systolic function EF of 38% to 65%, wall thickness was increased with mild LVH, with grade 1 diastolic dysfunction.  The patient had high ventricular filling pressures.  Mild LAE.  The mitral valve had a calcified annulus.  The patient had been placed on Lasix 20 mg daily along with potassium replacement.  This is caused her blood pressure to be very low.  She is symptomatic with dizziness.  Her main complaint is pain and swelling in her lower extremities and dyspnea on exertion.  She denies chest pain.  Past Medical History:  Diagnosis Date  . Cancer [facility])    history of no adjunctive RX( no chemo no RT)  . Cerebrovascular accident ([place])   . COLONIC POLYPS, HX OF 12/10/2006   Qualifier: Diagnosis of  By: Leanne Chang MD, Bruce    . Diabetes mellitus    Type 2  . Diabetes mellitus type II, controlled ([place]) 12/10/2006   Poor control on Januvia 100mg  and glipizide 10mg  XL Lab Results  Component Value Date   HGBA1C 8.3* 11/02/2014       . Eczema   . Fatty liver disease, nonalcoholic 18/29/9371   Per Dr. Leanne Chang Lab Results  Component Value Date   ALT 31 11/02/2014   AST 57* 11/02/2014   ALKPHOS 104 11/02/2014   BILITOT 1.1 11/02/2014      . Hypertension     Past Surgical History:  Procedure Laterality Date   . CATARACT EXTRACTION Left   . MASTECTOMY Left   . TUBAL LIGATION       Current Outpatient Medications  Medication Sig Dispense Refill  . aspirin 325 MG tablet Take 325 mg by mouth daily.      [name] atenolol (TENORMIN) 100 MG tablet TAKE 1 TABLET BY MOUTH TWICE A DAY 180 tablet 3  . Calcium Carbonate-Vitamin D (CALCIUM PLUS VITAMIN D) 600-100 MG-UNIT CAPS Take by mouth 2 (two) times daily.      . fluocinonide cream (LIDEX) 0.05 % Reported on 09/28/2015  2  . furosemide (LASIX) 20 MG tablet Take 1 tablet (20 mg total) by mouth daily. 30 tablet 0  . glipiZIDE (GLUCOTROL XL) 10 MG 24 hr tablet TAKE 1 TABLET (10 MG TOTAL) BY MOUTH 2 (TWO) TIMES DAILY. 90 tablet 2  . glucose blood (ONE TOUCH ULTRA TEST) test strip 1 each by Other route 2 (two) times daily. Use as instructed 100 each 3  . hydrOXYzine (ATARAX/VISTARIL) 10 MG tablet Take 10 mg by mouth as needed. Reported on 12/19/2015  2  . JANUVIA 100 MG tablet TAKE 1 TABLET BY MOUTH EVERY DAY 90 tablet 3  . losartan (COZAAR) 100 MG tablet Take 1 tablet (100 mg total) by mouth daily. 90 tablet 2  . potassium chloride SA (K-DUR,KLOR-CON) 20  MEQ tablet Take 1 tablet (20 mEq total) by mouth daily. 30 tablet 0  . traZODone (DESYREL) 50 MG tablet Take 0.5-1 tablets (25-50 mg total) by mouth at bedtime as needed for sleep. 30 tablet 3  . triamcinolone cream (KENALOG) 0.1 % as needed. Reported on 09/28/2015     No current facility-administered medications for this visit.     Allergies:   Ace inhibitors; Chlorthalidone; Metformin; Penicillins; and Sulfamethoxazole    Social History:  The patient  reports that she has been smoking cigarettes.  She has been smoking about 0.10 packs per day. she has never used smokeless tobacco. She reports that she drinks alcohol. She reports that she does not use drugs.   Family History:  The patient's family history includes Cancer in her father; Pancreatic cancer in her sister; Stroke in her mother.    ROS: All other  systems are reviewed and negative. Unless otherwise mentioned in H&P    PHYSICAL EXAM: VS:  BP (!) 98/50   Pulse 78   Ht 5\' 2"  (1.575 m)   Wt 160 lb 3.2 oz (72.7 kg)   BMI 29.30 kg/m  , BMI Body mass index is 29.3 kg/m. GEN: Well nourished, well developed, in no acute distress  HEENT: normal  Neck: no JVD, carotid bruits, or masses Cardiac: RRR; soft systolic murmurs, rubs, or gallops,no edema  Respiratory:  clear to auscultation bilaterally, normal work of breathing GI: soft, nontender, nondistended, + BS MS: no deformity or atrophy absent pulses posterior tibial, dorsalis pedis, diminished popliteal.  No femoral bruits are auscultated. Skin: warm and dry, no rash Neuro:  Strength and sensation are intact Psych: euthymic mood, full affect   EKG: Normal sinus rhythm heart rate of 78 bpm.  Recent Labs: 08/07/2017: ALT 25; Brain Natriuretic Peptide 160 08/11/2017: BUN 13; Creatinine, Ser 1.16; Hemoglobin 8.1; Platelets 137.0; Potassium 4.9; Pro B Natriuretic peptide (BNP) 137.0; Sodium 134    Lipid Panel    Component Value Date/Time   CHOL 146 03/18/2017 0851   TRIG 154.0 (H) 03/18/2017 0851   TRIG 202 (HH) 07/02/2006 1110   HDL 49.80 03/18/2017 0851   CHOLHDL 3 03/18/2017 0851   VLDL 30.8 03/18/2017 0851   LDLCALC 65 03/18/2017 0851   LDLDIRECT 95.8 07/02/2006 1110      Wt Readings from Last 3 Encounters:  08/17/17 160 lb 3.2 oz (72.7 kg)  08/11/17 161 lb 14.4 oz (73.4 kg)  08/07/17 161 lb (73 kg)      ASSESSMENT AND PLAN:  1.  Chronic leg pain with lower extremity edema: She appears to have evidence of PAD, with possible venous insufficiency.  She does have some mild 1+ edema in the dependent position.  She complains of pain in her legs when she walks along with neuropathy pain with numbness and tingling in the bottoms of her feet.  I am going to order bilateral ABIs to evaluate further as I am unable to palpate pulses below the knee.  With her history of  hypertension and diabetes she has high likelihood of peripheral vascular disease.  Due to hypotension, I have asked her not to take the Lasix daily.  She is having some dizziness as well after taking it.  2.  Dyspnea: The patient often has dyspnea on exertion.  She denies any chest pressure however she has noticed that her energy has decreased somewhat.  I am going to schedule her for a [place] with cardiovascular risk factors of age, diabetes, hypertension, and unknown  lipid status.  3.  History of hypertension: She is on 2 antihypertensive medications now.  Stopping Lasix will help to normalize her blood pressure currently she is hypotensive.  4.  Diabetes: Followed by PCP.  Current medicines are reviewed at length with the patient today.    Labs/ tests ordered today include: Bilateral ABIs, Lexiscan Myoview.  I have discussed this plan with Dr. Martinique, he has reviewed the chart and EKG along with medications.  He agrees with my assessment and plan.  Phill Myron. West Pugh, ANP, AACC   08/17/2017 11:03 AM    [facility] [address] [place], [place] [address] Phone: [phone]; Fax: [phone]

## 2017-08-20 ENCOUNTER — Other Ambulatory Visit: Payer: Self-pay | Admitting: Adult Health

## 2017-08-20 DIAGNOSIS — R0989 Other specified symptoms and signs involving the circulatory and respiratory systems: Secondary | ICD-10-CM

## 2017-08-20 DIAGNOSIS — M79606 Pain in leg, unspecified: Secondary | ICD-10-CM

## 2017-08-21 ENCOUNTER — Telehealth (HOSPITAL_COMMUNITY): Payer: Self-pay

## 2017-08-21 ENCOUNTER — Other Ambulatory Visit: Payer: Self-pay | Admitting: Adult Health

## 2017-08-21 DIAGNOSIS — R0989 Other specified symptoms and signs involving the circulatory and respiratory systems: Secondary | ICD-10-CM

## 2017-08-21 DIAGNOSIS — M79606 Pain in leg, unspecified: Secondary | ICD-10-CM

## 2017-08-21 NOTE — Telephone Encounter (Signed)
Encounter complete. 

## 2017-08-24 ENCOUNTER — Other Ambulatory Visit: Payer: Self-pay | Admitting: Family Medicine

## 2017-08-26 ENCOUNTER — Ambulatory Visit (HOSPITAL_COMMUNITY)
Admission: RE | Admit: 2017-08-26 | Discharge: 2017-08-26 | Disposition: A | Discharge status: Home / Self Care | Payer: Medicare Other | Source: Ambulatory Visit | Attending: Cardiovascular Disease | Admitting: Cardiovascular Disease

## 2017-08-26 DIAGNOSIS — R0600 Dyspnea, unspecified: Secondary | ICD-10-CM | POA: Diagnosis not present

## 2017-08-26 DIAGNOSIS — R0609 Other forms of dyspnea: Secondary | ICD-10-CM | POA: Diagnosis not present

## 2017-08-26 LAB — MYOCARDIAL PERFUSION IMAGING
CHL CUP NUCLEAR SDS: 1
LV dias vol: 76 mL (ref 46–106)
LVSYSVOL: 22 mL
NUC STRESS TID: 1.28
Peak HR: 89 {beats}/min
Rest HR: 78 {beats}/min
SRS: 1
SSS: 2

## 2017-08-26 MED ORDER — REGADENOSON 0.4 MG/5ML IV SOLN
0.4000 mg | Freq: Once | INTRAVENOUS | Status: AC
  Administered 2017-08-26: 0.4 mg via INTRAVENOUS

## 2017-08-26 MED ORDER — TECHNETIUM TC 99M TETROFOSMIN IV KIT
32.0000 | PACK | Freq: Once | INTRAVENOUS | Status: AC | PRN
  Administered 2017-08-26: 32 via INTRAVENOUS
  Filled 2017-08-26: qty 32

## 2017-08-26 MED ORDER — TECHNETIUM TC 99M TETROFOSMIN IV KIT
10.6000 | PACK | Freq: Once | INTRAVENOUS | Status: AC | PRN
  Administered 2017-08-26: 10.6 via INTRAVENOUS
  Filled 2017-08-26: qty 11

## 2017-08-28 ENCOUNTER — Ambulatory Visit (HOSPITAL_COMMUNITY)
Admission: RE | Admit: 2017-08-28 | Payer: Medicare Other | Source: Ambulatory Visit | Attending: Adult Health | Admitting: Adult Health

## 2017-09-02 ENCOUNTER — Other Ambulatory Visit: Payer: Self-pay | Admitting: Family Medicine

## 2017-09-04 ENCOUNTER — Ambulatory Visit (HOSPITAL_COMMUNITY)
Admission: RE | Admit: 2017-09-04 | Discharge: 2017-09-04 | Disposition: A | Discharge status: Home / Self Care | Payer: Medicare Other | Source: Ambulatory Visit | Attending: Cardiology | Admitting: Cardiology

## 2017-09-04 DIAGNOSIS — E119 Type 2 diabetes mellitus without complications: Secondary | ICD-10-CM | POA: Diagnosis not present

## 2017-09-04 DIAGNOSIS — R9389 Abnormal findings on diagnostic imaging of other specified body structures: Secondary | ICD-10-CM | POA: Insufficient documentation

## 2017-09-04 DIAGNOSIS — I10 Essential (primary) hypertension: Secondary | ICD-10-CM | POA: Diagnosis not present

## 2017-09-04 DIAGNOSIS — R0989 Other specified symptoms and signs involving the circulatory and respiratory systems: Secondary | ICD-10-CM | POA: Diagnosis not present

## 2017-09-04 DIAGNOSIS — M79606 Pain in leg, unspecified: Secondary | ICD-10-CM | POA: Insufficient documentation

## 2017-09-04 DIAGNOSIS — Z8673 Personal history of transient ischemic attack (TIA), and cerebral infarction without residual deficits: Secondary | ICD-10-CM | POA: Diagnosis not present

## 2017-09-04 DIAGNOSIS — E785 Hyperlipidemia, unspecified: Secondary | ICD-10-CM | POA: Diagnosis not present

## 2017-09-07 ENCOUNTER — Encounter: Payer: Self-pay | Admitting: Adult Health

## 2017-09-07 ENCOUNTER — Ambulatory Visit (INDEPENDENT_AMBULATORY_CARE_PROVIDER_SITE_OTHER): Payer: Medicare Other | Admitting: Adult Health

## 2017-09-07 VITALS — BP 102/50 | HR 87 | Ht 62.0 in | Wt 167.0 lb

## 2017-09-07 DIAGNOSIS — Z79899 Other long term (current) drug therapy: Secondary | ICD-10-CM | POA: Diagnosis not present

## 2017-09-07 DIAGNOSIS — R600 Localized edema: Secondary | ICD-10-CM | POA: Diagnosis not present

## 2017-09-07 DIAGNOSIS — E118 Type 2 diabetes mellitus with unspecified complications: Secondary | ICD-10-CM | POA: Diagnosis not present

## 2017-09-07 DIAGNOSIS — I10 Essential (primary) hypertension: Secondary | ICD-10-CM | POA: Diagnosis not present

## 2017-09-07 MED ORDER — FUROSEMIDE 20 MG PO TABS
20.0000 mg | ORAL_TABLET | ORAL | 3 refills | Status: DC
Start: 2017-09-07 — End: 2017-11-04

## 2017-09-07 MED ORDER — LOSARTAN POTASSIUM 50 MG PO TABS: 50.0000 mg | ORAL_TABLET | Freq: Every day | ORAL | 2 refills | Status: DC

## 2017-09-07 NOTE — Progress Notes (Signed)
Cardiology Office Note   Date:  09/07/2017   ID:  Anita Mccormick, DOB Jul 20, 1946, MRN [id number]  PCP:  Billie Ruddy, MD  Cardiologist: Dr. Martinique (has not actually seen patient yet). Chief Complaint  Patient presents with  . Follow-up    Post testing.  . Edema    Goes down but swells again.  [name] Shortness of Breath    Since she has been swelling.     History of Present Illness: Anita Mccormick is a 72 y.o. female who presents for ongoing assessment and management of CHF, chronic lower extremity edema, and abdominal distention.  Note, the patient is allergic to ACE inhibitors chlorthalidone metformin penicillins and sulfa.   When seen last on 08/17/2017 as a new patient, she complained of symptomatic dizziness, lower extremity edema and dyspnea on exertion.  She denied any chest pain.  We reviewed her echocardiogram on 08/11/2017 which revealed normal LV systolic function EF of 35% to 65%, wall thickness was increased with mild LVH, with grade 1 diastolic dysfunction.  The patient had high ventricular filling pressures.  Mild LAE.  The mitral valve had a calcified annulus  On that last office visit due to diminished pulses in her legs and pain with walking, we ordered ABIs.  We reduced her Lasix to as needed as opposed to daily due to hypotension, and dizziness after taking diuretic she is planned for a Lexiscan Myoview with cardiovascular risk factors of age diabetes hypertension and unknown lipid status.  Nuclear medicine stress test dated 08/26/2017:  The left ventricular ejection fraction is hyperdynamic (>65%).  Nuclear stress EF: 70%.  There was no ST segment deviation noted during stress.  No T wave inversion was noted during stress.  The study is normal.  This is a low risk study.  ABIs were completed on 09/04/2017: This revealed normal indices with no further testing planned.   Past Medical History:  Diagnosis Date  . Cancer [facility])    history of no adjunctive RX( no  chemo no RT)  . Cerebrovascular accident ([place])   . COLONIC POLYPS, HX OF 12/10/2006   Qualifier: Diagnosis of  By: Leanne Chang MD, Bruce    . Diabetes mellitus    Type 2  . Diabetes mellitus type II, controlled ([place]) 12/10/2006   Poor control on Januvia 100mg  and glipizide 10mg  XL Lab Results  Component Value Date   HGBA1C 8.3* 11/02/2014       . Eczema   . Fatty liver disease, nonalcoholic 46/56/8127   Per Dr. Leanne Chang Lab Results  Component Value Date   ALT 31 11/02/2014   AST 57* 11/02/2014   ALKPHOS 104 11/02/2014   BILITOT 1.1 11/02/2014      . Hypertension     Past Surgical History:  Procedure Laterality Date  . CATARACT EXTRACTION Left   . MASTECTOMY Left   . TUBAL LIGATION       Current Outpatient Medications  Medication Sig Dispense Refill  . aspirin 325 MG tablet Take 325 mg by mouth daily.      [name] atenolol (TENORMIN) 100 MG tablet TAKE 1 TABLET BY MOUTH TWICE A DAY 180 tablet 3  . Calcium Carbonate-Vitamin D (CALCIUM PLUS VITAMIN D) 600-100 MG-UNIT CAPS Take by mouth 2 (two) times daily.      . fluocinonide cream (LIDEX) 0.05 % Reported on 09/28/2015  2  . furosemide (LASIX) 20 MG tablet Take 1 tablet (20 mg total) by mouth every other day. AND HOLD IF NO SWELLING  30 tablet 3  . glipiZIDE (GLUCOTROL XL) 10 MG 24 hr tablet TAKE 1 TABLET (10 MG TOTAL) BY MOUTH 2 (TWO) TIMES DAILY. 90 tablet 2  . glucose blood (ONE TOUCH ULTRA TEST) test strip 1 each by Other route 2 (two) times daily. Use as instructed 100 each 3  . hydrOXYzine (ATARAX/VISTARIL) 10 MG tablet Take 10 mg by mouth as needed. Reported on 12/19/2015  2  . JANUVIA 100 MG tablet TAKE 1 TABLET BY MOUTH EVERY DAY 90 tablet 1  . losartan (COZAAR) 50 MG tablet Take 1 tablet (50 mg total) by mouth daily. 30 tablet 2  . potassium chloride SA (K-DUR,KLOR-CON) 20 MEQ tablet Take 1 tablet (20 mEq total) by mouth daily. 30 tablet 0  . traZODone (DESYREL) 50 MG tablet Take 0.5-1 tablets (25-50 mg total) by mouth at bedtime as needed for  sleep. 30 tablet 3  . triamcinolone cream (KENALOG) 0.1 % as needed. Reported on 09/28/2015     No current facility-administered medications for this visit.     Allergies:   Ace inhibitors; Chlorthalidone; Metformin; Penicillins; and Sulfamethoxazole    Social History:  The patient  reports that she has been smoking cigarettes.  She has been smoking about 0.10 packs per day. she has never used smokeless tobacco. She reports that she drinks alcohol. She reports that she does not use drugs.   Family History:  The patient's family history includes Cancer in her father; Pancreatic cancer in her sister; Stroke in her mother.    ROS: All other systems are reviewed and negative. Unless otherwise mentioned in H&P    PHYSICAL EXAM: VS:  BP (!) 102/50   Pulse 87   Ht 5\' 2"  (1.575 m)   Wt 167 lb (75.8 kg)   BMI 30.54 kg/m  , BMI Body mass index is 30.54 kg/m. GEN: Well nourished, well developed, in no acute distress  HEENT: normal  Neck: no JVD, carotid bruits, or masses Cardiac: RRR; no significant murmurs, rubs, or gallops, 1+ dependent pretibial edema bilateral lower extremities Respiratory:  clear to auscultation bilaterally, normal work of breathing GI: soft, nontender, nondistended, + BS MS: no deformity or atrophy  Skin: warm and dry, no rash Neuro:  Strength and sensation are intact Psych: euthymic mood, full affect   Recent Labs: 08/07/2017: ALT 25; Brain Natriuretic Peptide 160 08/11/2017: BUN 13; Creatinine, Ser 1.16; Hemoglobin 8.1; Platelets 137.0; Potassium 4.9; Pro B Natriuretic peptide (BNP) 137.0; Sodium 134    Lipid Panel    Component Value Date/Time   CHOL 146 03/18/2017 0851   TRIG 154.0 (H) 03/18/2017 0851   TRIG 202 (HH) 07/02/2006 1110   HDL 49.80 03/18/2017 0851   CHOLHDL 3 03/18/2017 0851   VLDL 30.8 03/18/2017 0851   LDLCALC 65 03/18/2017 0851   LDLDIRECT 95.8 07/02/2006 1110      Wt Readings from Last 3 Encounters:  09/07/17 167 lb (75.8 kg)    08/26/17 160 lb (72.6 kg)  08/17/17 160 lb 3.2 oz (72.7 kg)    ASSESSMENT AND PLAN:  1. Chronic lower extremity edema: The patient does not taken any of her medications today. I've advised her to begin Lasix every other day until edema subsides and then taking when necessary. She is to weigh herself daily and avoid salty foods.  2. Leg pain: ABIs are normal. May be related to neuralgia in the setting of diabetes.  3. Hypertension: I'm going to decrease losartan to 50 mg daily from 100 mg daily as  she is beginning back on Lasix. I want to avoid hypotension in this elderly woman.   Current medicines are reviewed at length with the patient today.    Labs/ tests ordered today include: BMET  Phill Myron. West Pugh, ANP, AACC   09/07/2017 12:08 PM    [facility] [address], [place], [place] [address] Phone: [phone]; Fax: [phone]

## 2017-09-07 NOTE — Patient Instructions (Signed)
Medication Instructions:  DECREASE LOSARTAN 50MG  DAILY  TAKE LASIX EVER-OTHER-DAY AND HOLD IF NO SWELLING  If you need a refill on your cardiac medications before your next appointment, please call your pharmacy.  Labwork: BMET IN 2 WEEKS (~09-21-2017) HERE IN OUR OFFICE AT [facility]  Take the provided lab slips for you to take with you to the lab for you blood draw.   You will NOT need to fast   You may go to any [facility] lab that is convenient for you however, we do have a lab in our office that is able to assist you. You do NOT need an appointment for our lab. Once in our office lobby there is a podium to the right of the check-in desk where you are to sign-in and ring a doorbell to alert [place] you are here. Lab is open Monday-Friday from 8:00am to 4:00pm; and is closed for lunch from 12:45p-1:45pm   Follow-Up: Your physician wants you to follow-up in: 1 MONTH WITH DR Martinique -OR- KATHRYN LAWRENCE, [place].   Thank you for choosing [facility] at [facility]!!

## 2017-09-14 DIAGNOSIS — E119 Type 2 diabetes mellitus without complications: Secondary | ICD-10-CM | POA: Diagnosis not present

## 2017-09-16 ENCOUNTER — Other Ambulatory Visit: Payer: Self-pay

## 2017-09-16 MED ORDER — GLIPIZIDE ER 10 MG PO TB24: 10.0000 mg | ORAL_TABLET | Freq: Two times a day (BID) | ORAL | 2 refills | Status: DC

## 2017-09-18 ENCOUNTER — Ambulatory Visit: Payer: Medicare Other | Admitting: Podiatry

## 2017-09-19 ENCOUNTER — Other Ambulatory Visit: Payer: Self-pay

## 2017-09-19 ENCOUNTER — Encounter (HOSPITAL_COMMUNITY): Payer: Self-pay | Admitting: Emergency Medicine

## 2017-09-19 ENCOUNTER — Inpatient Hospital Stay (HOSPITAL_COMMUNITY)
Admission: EM | Admit: 2017-09-19 | Discharge: 2017-09-23 | DRG: 872 | Disposition: A | Discharge status: Home / Self Care | Payer: Medicare Other | Attending: Internal Medicine | Admitting: Internal Medicine

## 2017-09-19 ENCOUNTER — Emergency Department (HOSPITAL_COMMUNITY): Payer: Medicare Other

## 2017-09-19 DIAGNOSIS — Z9842 Cataract extraction status, left eye: Secondary | ICD-10-CM

## 2017-09-19 DIAGNOSIS — Z8673 Personal history of transient ischemic attack (TIA), and cerebral infarction without residual deficits: Secondary | ICD-10-CM

## 2017-09-19 DIAGNOSIS — I130 Hypertensive heart and chronic kidney disease with heart failure and stage 1 through stage 4 chronic kidney disease, or unspecified chronic kidney disease: Secondary | ICD-10-CM | POA: Diagnosis not present

## 2017-09-19 DIAGNOSIS — E1129 Type 2 diabetes mellitus with other diabetic kidney complication: Secondary | ICD-10-CM | POA: Diagnosis present

## 2017-09-19 DIAGNOSIS — D649 Anemia, unspecified: Secondary | ICD-10-CM

## 2017-09-19 DIAGNOSIS — I5032 Chronic diastolic (congestive) heart failure: Secondary | ICD-10-CM | POA: Diagnosis not present

## 2017-09-19 DIAGNOSIS — Z79899 Other long term (current) drug therapy: Secondary | ICD-10-CM | POA: Diagnosis not present

## 2017-09-19 DIAGNOSIS — F17210 Nicotine dependence, cigarettes, uncomplicated: Secondary | ICD-10-CM | POA: Diagnosis present

## 2017-09-19 DIAGNOSIS — Z7984 Long term (current) use of oral hypoglycemic drugs: Secondary | ICD-10-CM

## 2017-09-19 DIAGNOSIS — E785 Hyperlipidemia, unspecified: Secondary | ICD-10-CM | POA: Diagnosis present

## 2017-09-19 DIAGNOSIS — N179 Acute kidney failure, unspecified: Secondary | ICD-10-CM

## 2017-09-19 DIAGNOSIS — K649 Unspecified hemorrhoids: Secondary | ICD-10-CM | POA: Diagnosis present

## 2017-09-19 DIAGNOSIS — I10 Essential (primary) hypertension: Secondary | ICD-10-CM | POA: Diagnosis present

## 2017-09-19 DIAGNOSIS — Z7982 Long term (current) use of aspirin: Secondary | ICD-10-CM

## 2017-09-19 DIAGNOSIS — Z880 Allergy status to penicillin: Secondary | ICD-10-CM

## 2017-09-19 DIAGNOSIS — N390 Urinary tract infection, site not specified: Secondary | ICD-10-CM | POA: Diagnosis present

## 2017-09-19 DIAGNOSIS — Z9012 Acquired absence of left breast and nipple: Secondary | ICD-10-CM | POA: Diagnosis not present

## 2017-09-19 DIAGNOSIS — A419 Sepsis, unspecified organism: Secondary | ICD-10-CM | POA: Diagnosis not present

## 2017-09-19 DIAGNOSIS — D703 Neutropenia due to infection: Secondary | ICD-10-CM | POA: Diagnosis not present

## 2017-09-19 DIAGNOSIS — N182 Chronic kidney disease, stage 2 (mild): Secondary | ICD-10-CM | POA: Diagnosis present

## 2017-09-19 DIAGNOSIS — R5081 Fever presenting with conditions classified elsewhere: Secondary | ICD-10-CM | POA: Diagnosis not present

## 2017-09-19 DIAGNOSIS — R000 Tachycardia, unspecified: Secondary | ICD-10-CM | POA: Diagnosis not present

## 2017-09-19 DIAGNOSIS — D72819 Decreased white blood cell count, unspecified: Secondary | ICD-10-CM

## 2017-09-19 DIAGNOSIS — Z1611 Resistance to penicillins: Secondary | ICD-10-CM | POA: Diagnosis present

## 2017-09-19 DIAGNOSIS — Z853 Personal history of malignant neoplasm of breast: Secondary | ICD-10-CM

## 2017-09-19 DIAGNOSIS — I5033 Acute on chronic diastolic (congestive) heart failure: Secondary | ICD-10-CM | POA: Diagnosis present

## 2017-09-19 DIAGNOSIS — N3000 Acute cystitis without hematuria: Secondary | ICD-10-CM

## 2017-09-19 DIAGNOSIS — F17200 Nicotine dependence, unspecified, uncomplicated: Secondary | ICD-10-CM | POA: Diagnosis present

## 2017-09-19 DIAGNOSIS — E118 Type 2 diabetes mellitus with unspecified complications: Secondary | ICD-10-CM | POA: Diagnosis not present

## 2017-09-19 DIAGNOSIS — D696 Thrombocytopenia, unspecified: Secondary | ICD-10-CM | POA: Diagnosis not present

## 2017-09-19 DIAGNOSIS — E1122 Type 2 diabetes mellitus with diabetic chronic kidney disease: Secondary | ICD-10-CM | POA: Diagnosis present

## 2017-09-19 DIAGNOSIS — A4151 Sepsis due to Escherichia coli [E. coli]: Secondary | ICD-10-CM | POA: Diagnosis not present

## 2017-09-19 DIAGNOSIS — K760 Fatty (change of) liver, not elsewhere classified: Secondary | ICD-10-CM | POA: Diagnosis not present

## 2017-09-19 DIAGNOSIS — D61818 Other pancytopenia: Secondary | ICD-10-CM | POA: Diagnosis present

## 2017-09-19 HISTORY — DX: Sepsis, unspecified organism: A41.9

## 2017-09-19 HISTORY — DX: Urinary tract infection, site not specified: N39.0

## 2017-09-19 LAB — SAVE SMEAR

## 2017-09-19 LAB — URINALYSIS, ROUTINE W REFLEX MICROSCOPIC
Bilirubin Urine: NEGATIVE
Ketones, ur: NEGATIVE mg/dL
NITRITE: POSITIVE — AB
PH: 5 (ref 5.0–8.0)
PROTEIN: NEGATIVE mg/dL
SPECIFIC GRAVITY, URINE: 1.006 (ref 1.005–1.030)

## 2017-09-19 LAB — COMPREHENSIVE METABOLIC PANEL
ALK PHOS: 194 U/L — AB (ref 38–126)
ALT: 25 U/L (ref 14–54)
AST: 68 U/L — AB (ref 15–41)
Albumin: 2.7 g/dL — ABNORMAL LOW (ref 3.5–5.0)
Anion gap: 14 (ref 5–15)
BUN: 11 mg/dL (ref 6–20)
CALCIUM: 9.6 mg/dL (ref 8.9–10.3)
CHLORIDE: 102 mmol/L (ref 101–111)
CO2: 20 mmol/L — AB (ref 22–32)
CREATININE: 1.12 mg/dL — AB (ref 0.44–1.00)
GFR calc non Af Amer: 49 mL/min — ABNORMAL LOW (ref >60)
GFR, EST AFRICAN AMERICAN: 56 mL/min — AB (ref >60)
Glucose, Bld: 301 mg/dL — ABNORMAL HIGH (ref 65–99)
Potassium: 4.5 mmol/L (ref 3.5–5.1)
SODIUM: 136 mmol/L (ref 135–145)
Total Bilirubin: 1.1 mg/dL (ref 0.3–1.2)
Total Protein: 7.2 g/dL (ref 6.5–8.1)

## 2017-09-19 LAB — CBC WITH DIFFERENTIAL/PLATELET
BASOS PCT: 1 %
Basophils Absolute: 0 10*3/uL (ref 0.0–0.1)
EOS ABS: 0 10*3/uL (ref 0.0–0.7)
EOS PCT: 1 %
HCT: 25.4 % — ABNORMAL LOW (ref 36.0–46.0)
Hemoglobin: 7.6 g/dL — ABNORMAL LOW (ref 12.0–15.0)
LYMPHS ABS: 0.3 10*3/uL — AB (ref 0.7–4.0)
Lymphocytes Relative: 13 %
MCH: 28.6 pg (ref 26.0–34.0)
MCHC: 29.9 g/dL — AB (ref 30.0–36.0)
MCV: 95.5 fL (ref 78.0–100.0)
MONOS PCT: 8 %
Monocytes Absolute: 0.2 10*3/uL (ref 0.1–1.0)
Neutro Abs: 1.6 10*3/uL — ABNORMAL LOW (ref 1.7–7.7)
Neutrophils Relative %: 77 %
PLATELETS: 113 10*3/uL — AB (ref 150–400)
RBC: 2.66 MIL/uL — ABNORMAL LOW (ref 3.87–5.11)
RDW: 17.2 % — AB (ref 11.5–15.5)
WBC: 2 10*3/uL — ABNORMAL LOW (ref 4.0–10.5)

## 2017-09-19 LAB — PROCALCITONIN: PROCALCITONIN: 1.58 ng/mL

## 2017-09-19 LAB — INFLUENZA PANEL BY PCR (TYPE A & B)
Influenza A By PCR: NEGATIVE
Influenza B By PCR: NEGATIVE

## 2017-09-19 LAB — I-STAT CG4 LACTIC ACID, ED
LACTIC ACID, VENOUS: 5.49 mmol/L — AB (ref 0.5–1.9)
Lactic Acid, Venous: 7.28 mmol/L (ref 0.5–1.9)

## 2017-09-19 LAB — BRAIN NATRIURETIC PEPTIDE: B NATRIURETIC PEPTIDE 5: 334.9 pg/mL — AB (ref 0.0–100.0)

## 2017-09-19 LAB — CBG MONITORING, ED: Glucose-Capillary: 269 mg/dL — ABNORMAL HIGH (ref 65–99)

## 2017-09-19 MED ORDER — ONDANSETRON HCL 4 MG PO TABS: 4.0000 mg | ORAL_TABLET | Freq: Four times a day (QID) | ORAL | Status: DC | PRN

## 2017-09-19 MED ORDER — SODIUM CHLORIDE 0.9 % IV BOLUS (SEPSIS)
1000.0000 mL | Freq: Once | INTRAVENOUS | Status: AC
  Administered 2017-09-19: 1000 mL via INTRAVENOUS

## 2017-09-19 MED ORDER — ATENOLOL 100 MG PO TABS
100.0000 mg | ORAL_TABLET | Freq: Two times a day (BID) | ORAL | Status: DC
  Administered 2017-09-20 – 2017-09-23 (×7): 100 mg via ORAL
  Filled 2017-09-19 (×5): qty 1
  Filled 2017-09-19: qty 2
  Filled 2017-09-19 (×2): qty 1

## 2017-09-19 MED ORDER — LEVOFLOXACIN IN D5W 750 MG/150ML IV SOLN
750.0000 mg | Freq: Once | INTRAVENOUS | Status: AC
  Administered 2017-09-19: 750 mg via INTRAVENOUS
  Filled 2017-09-19: qty 150

## 2017-09-19 MED ORDER — ACETAMINOPHEN 325 MG PO TABS
650.0000 mg | ORAL_TABLET | Freq: Once | ORAL | Status: AC
  Administered 2017-09-19: 650 mg via ORAL
  Filled 2017-09-19: qty 2

## 2017-09-19 MED ORDER — SODIUM CHLORIDE 0.9 % IV BOLUS (SEPSIS): 1000.0000 mL | Freq: Once | INTRAVENOUS | Status: DC

## 2017-09-19 MED ORDER — SODIUM CHLORIDE 0.9 % IV SOLN
1.0000 g | Freq: Three times a day (TID) | INTRAVENOUS | Status: DC
  Administered 2017-09-20: 1 g via INTRAVENOUS
  Filled 2017-09-19 (×2): qty 1

## 2017-09-19 MED ORDER — ONDANSETRON HCL 4 MG/2ML IJ SOLN: 4.0000 mg | Freq: Four times a day (QID) | INTRAMUSCULAR | Status: DC | PRN

## 2017-09-19 MED ORDER — SODIUM CHLORIDE 0.9 % IV SOLN
2.0000 g | Freq: Once | INTRAVENOUS | Status: AC
  Administered 2017-09-19: 2 g via INTRAVENOUS
  Filled 2017-09-19: qty 2

## 2017-09-19 MED ORDER — NICOTINE 21 MG/24HR TD PT24
21.0000 mg | MEDICATED_PATCH | Freq: Every day | TRANSDERMAL | Status: DC
  Administered 2017-09-19 – 2017-09-23 (×5): 21 mg via TRANSDERMAL
  Filled 2017-09-19 (×5): qty 1

## 2017-09-19 MED ORDER — INSULIN ASPART 100 UNIT/ML ~~LOC~~ SOLN
0.0000 [IU] | Freq: Three times a day (TID) | SUBCUTANEOUS | Status: DC
  Administered 2017-09-20: 5 [IU] via SUBCUTANEOUS
  Administered 2017-09-20: 7 [IU] via SUBCUTANEOUS
  Administered 2017-09-20 – 2017-09-21 (×2): 3 [IU] via SUBCUTANEOUS

## 2017-09-19 MED ORDER — INSULIN ASPART 100 UNIT/ML ~~LOC~~ SOLN
0.0000 [IU] | Freq: Every day | SUBCUTANEOUS | Status: DC
  Administered 2017-09-19: 3 [IU] via SUBCUTANEOUS
  Administered 2017-09-20: 2 [IU] via SUBCUTANEOUS
  Filled 2017-09-19: qty 1

## 2017-09-19 MED ORDER — SODIUM CHLORIDE 0.9 % IV SOLN
INTRAVENOUS | Status: DC
  Administered 2017-09-19 – 2017-09-20 (×2): via INTRAVENOUS

## 2017-09-19 MED ORDER — ZOLPIDEM TARTRATE 5 MG PO TABS
5.0000 mg | ORAL_TABLET | Freq: Every evening | ORAL | Status: DC | PRN
  Administered 2017-09-19 – 2017-09-22 (×3): 5 mg via ORAL
  Filled 2017-09-19 (×3): qty 1

## 2017-09-19 MED ORDER — VANCOMYCIN HCL IN DEXTROSE 750-5 MG/150ML-% IV SOLN
750.0000 mg | Freq: Two times a day (BID) | INTRAVENOUS | Status: DC
  Filled 2017-09-19: qty 150

## 2017-09-19 MED ORDER — VANCOMYCIN HCL IN DEXTROSE 1-5 GM/200ML-% IV SOLN
1000.0000 mg | Freq: Once | INTRAVENOUS | Status: DC
  Filled 2017-09-19: qty 200

## 2017-09-19 MED ORDER — VANCOMYCIN HCL 10 G IV SOLR
1500.0000 mg | Freq: Once | INTRAVENOUS | Status: AC
  Administered 2017-09-19: 1500 mg via INTRAVENOUS
  Filled 2017-09-19: qty 1500

## 2017-09-19 MED ORDER — ACETAMINOPHEN 325 MG PO TABS
650.0000 mg | ORAL_TABLET | Freq: Four times a day (QID) | ORAL | Status: DC | PRN
  Administered 2017-09-21: 650 mg via ORAL
  Filled 2017-09-19 (×2): qty 2

## 2017-09-19 MED ORDER — LEVOFLOXACIN IN D5W 750 MG/150ML IV SOLN: 750.0000 mg | INTRAVENOUS | Status: DC

## 2017-09-19 MED ORDER — CALCIUM CARBONATE-VITAMIN D 500-200 MG-UNIT PO TABS
1.0000 | ORAL_TABLET | Freq: Two times a day (BID) | ORAL | Status: DC
  Administered 2017-09-20 – 2017-09-23 (×8): 1 via ORAL
  Filled 2017-09-19 (×8): qty 1

## 2017-09-19 MED ORDER — HYDRALAZINE HCL 20 MG/ML IJ SOLN: 5.0000 mg | INTRAMUSCULAR | Status: DC | PRN

## 2017-09-19 MED ORDER — ENOXAPARIN SODIUM 40 MG/0.4ML ~~LOC~~ SOLN
40.0000 mg | SUBCUTANEOUS | Status: DC
  Administered 2017-09-20: 40 mg via SUBCUTANEOUS
  Filled 2017-09-19 (×2): qty 0.4

## 2017-09-19 MED ORDER — ASPIRIN 325 MG PO TABS
325.0000 mg | ORAL_TABLET | Freq: Every day | ORAL | Status: DC
  Administered 2017-09-20 – 2017-09-23 (×4): 325 mg via ORAL
  Filled 2017-09-19 (×4): qty 1

## 2017-09-19 MED ORDER — SODIUM CHLORIDE 0.9 % IV BOLUS (SEPSIS)
500.0000 mL | Freq: Once | INTRAVENOUS | Status: AC
Start: 2017-09-19 — End: 2017-09-19
  Administered 2017-09-19: 500 mL via INTRAVENOUS

## 2017-09-19 NOTE — ED Notes (Signed)
Alex-PA notified of elevated CG-4

## 2017-09-19 NOTE — ED Notes (Signed)
Pt placed on pure wick at this time.  

## 2017-09-19 NOTE — H&P (Signed)
History and Physical    Anita Mccormick SFK:[id number] DOB: 1946/02/23 DOA: 09/19/2017  Referring MD/NP/PA:   PCP: Billie Ruddy, MD   Patient coming from:  The patient is coming from home.  At baseline, pt is independent for most of ADL.  Chief Complaint: Vver, chills, dysuria and urinary frequency.  HPI: Anita Mccormick is a 72 y.o. female with medical history significant of Hypertension, hyperlipidemia, diabetes mellitus, stroke, breast cancer in remission, tobacco abuse, CKD-2, dCHF, who presents with fever, chills, dysuria and urinary frequency.  Pt state that she has been feeling bad in the past several days. She has fever and chill. She reports dysuria, urgency and frequency. No flank pain. She also reports a headache. Denies neck rigidity. No unilateral weakness. Patient has nausea and vomited twice, but no diarrhea or abdominal pain. Patient does not have chest pain, shortness breath, cough, runny nose or sore throat. Patient states that her blood pressure has also been elevated recently. She has bilateral leg edema.  ED Course: pt was found to have positive urinalysis with large amount of leukocytes and positive nitrite, pancytopenia (WBC 2.0, hemoglobin 7.6<--8.1 on 08/14/17, platelets 113), lactic acid 7.28-->5.49, negative flu PCR, slightly worsening renal function, temperature 103.1, tachycardia, tachypnea, oxygen saturation 100% on room air, negative chest x-ray. Patient is admitted to telemetry bed as inpatient.  Review of Systems:   General: has fevers, chills, no body weight gain, has poor appetite, has fatigue HEENT: no blurry vision, hearing changes or sore throat Respiratory: no dyspnea, coughing, wheezing CV: no chest pain, no palpitations GI: has nausea, vomiting, no abdominal pain, diarrhea, constipation GU: no dysuria, burning on urination, increased urinary frequency, hematuria  Ext: has leg edema Neuro: no unilateral weakness, numbness, or tingling, no vision  change or hearing loss Skin: no rash, no skin tear. MSK: No muscle spasm, no deformity, no limitation of range of movement in spin Heme: No easy bruising.  Travel history: No recent long distant travel.  Allergy:  Allergies  Allergen Reactions  . Ace Inhibitors Cough    ????  . Chlorthalidone     REACTION: unspecified  . Metformin     REACTION: gi side effects  . Penicillins     REACTION: urticaria (hives)  . Sulfamethoxazole     REACTION: questionable    Past Medical History:  Diagnosis Date  . Cancer [facility])    history of no adjunctive RX( no chemo no RT)  . Cerebrovascular accident ([place])   . COLONIC POLYPS, HX OF 12/10/2006   Qualifier: Diagnosis of  By: Leanne Chang MD, Bruce    . Diabetes mellitus    Type 2  . Diabetes mellitus type II, controlled ([place]) 12/10/2006   Poor control on Januvia 100mg  and glipizide 10mg  XL Lab Results  Component Value Date   HGBA1C 8.3* 11/02/2014       . Eczema   . Fatty liver disease, nonalcoholic 17/49/4496   Per Dr. Leanne Chang Lab Results  Component Value Date   ALT 31 11/02/2014   AST 57* 11/02/2014   ALKPHOS 104 11/02/2014   BILITOT 1.1 11/02/2014      . Hypertension     Past Surgical History:  Procedure Laterality Date  . CATARACT EXTRACTION Left   . MASTECTOMY Left   . TUBAL LIGATION      Social History:  reports that she has been smoking cigarettes.  She has been smoking about 0.10 packs per day. she has never used smokeless tobacco. She reports that she  drinks alcohol. She reports that she does not use drugs.  Family History:  Family History  Problem Relation Age of Onset  . Stroke Mother   . Cancer Father        prostate  . Pancreatic cancer Sister      Prior to Admission medications   Medication Sig Start Date End Date Taking? Authorizing Provider  aspirin 325 MG tablet Take 325 mg by mouth daily.     Yes [provider]  atenolol (TENORMIN) 100 MG tablet TAKE 1 TABLET BY MOUTH TWICE A DAY Patient taking differently:  TAKE 1 TABLET (100 MG) BY MOUTH TWICE A DAY 10/20/16  Yes Marin Olp, MD  Calcium Carbonate-Vitamin D (CALCIUM PLUS VITAMIN D) 600-100 MG-UNIT CAPS Take by mouth 2 (two) times daily.     Yes [provider]  furosemide (LASIX) 20 MG tablet Take 1 tablet (20 mg total) by mouth every other day. AND HOLD IF NO SWELLING 09/07/17  Yes Lendon Colonel, NP  glipiZIDE (GLUCOTROL XL) 10 MG 24 hr tablet Take 1 tablet (10 mg total) by mouth 2 (two) times daily. 09/16/17  Yes Marin Olp, MD  JANUVIA 100 MG tablet TAKE 1 TABLET BY MOUTH EVERY DAY Patient taking differently: TAKE 1 TABLET (100 MG)  BY MOUTH EVERY DAY 09/02/17  Yes Billie Ruddy, MD  losartan (COZAAR) 50 MG tablet Take 1 tablet (50 mg total) by mouth daily. 09/07/17  Yes Lendon Colonel, NP  naproxen sodium (ALEVE) 220 MG tablet Take 220 mg by mouth daily as needed (for pain).   Yes [provider]  glucose blood (ONE TOUCH ULTRA TEST) test strip 1 each by Other route 2 (two) times daily. Use as instructed 06/30/13   Swords, Darrick Penna, MD  potassium chloride SA (K-DUR,KLOR-CON) 20 MEQ tablet Take 1 tablet (20 mEq total) by mouth daily. Patient not taking: Reported on 09/19/2017 08/08/17   Billie Ruddy, MD  traZODone (DESYREL) 50 MG tablet Take 0.5-1 tablets (25-50 mg total) by mouth at bedtime as needed for sleep. Patient not taking: Reported on 09/19/2017 08/21/15   Marin Olp, MD    Physical Exam: Vitals:   09/19/17 1823 09/19/17 2000 09/19/17 2002 09/19/17 2030  BP:  (!) 112/51  (!) 103/54  Pulse:  98  96  Resp:  16  (!) 25  Temp:   100.1 F (37.8 C)   TempSrc:   Oral   SpO2:  100%  100%  Weight: 77.1 kg (170 lb)     Height: 5\' 2"  (1.575 m)      General: Not in acute distress HEENT:       Eyes: PERRL, EOMI, no scleral icterus.       ENT: No discharge from the ears and nose, no pharynx injection, no tonsillar enlargement.        Neck: No JVD, no bruit, no mass felt. Heme: No neck lymph  node enlargement. Cardiac: S1/S2, RRR, No murmurs, No gallops or rubs. Respiratory: No rales, wheezing, rhonchi or rubs. GI: Soft, nondistended, nontender, no rebound pain, no organomegaly, BS present. GU: No hematuria Ext: 2+ pitting leg edema bilaterally. 2+DP/PT pulse bilaterally. Musculoskeletal: No joint deformities, No joint redness or warmth, no limitation of ROM in spin. Skin: No rashes.  Neuro: Alert, oriented X3, cranial nerves II-XII grossly intact, moves all extremities normally.   Psych: Patient is not psychotic, no suicidal or hemocidal ideation.  Labs on Admission: I have personally reviewed following labs  and imaging studies  CBC: Recent Labs  Lab 09/19/17 1650  WBC 2.0*  NEUTROABS 1.6*  HGB 7.6*  HCT 25.4*  MCV 95.5  PLT 409*   Basic Metabolic Panel: Recent Labs  Lab 09/19/17 1650  NA 136  K 4.5  CL 102  CO2 20*  GLUCOSE 301*  BUN 11  CREATININE 1.12*  CALCIUM 9.6   GFR: Estimated Creatinine Clearance: 44.9 mL/min (A) (by C-G formula based on SCr of 1.12 mg/dL (H)). Liver Function Tests: Recent Labs  Lab 09/19/17 1650  AST 68*  ALT 25  ALKPHOS 194*  BILITOT 1.1  PROT 7.2  ALBUMIN 2.7*   No results for input(s): LIPASE, AMYLASE in the last 168 hours. No results for input(s): AMMONIA in the last 168 hours. Coagulation Profile: No results for input(s): INR, PROTIME in the last 168 hours. Cardiac Enzymes: No results for input(s): CKTOTAL, CKMB, CKMBINDEX, TROPONINI in the last 168 hours. BNP (last 3 results) Recent Labs    08/04/17 1622 08/11/17 1604  PROBNP 257.0* 137.0*   HbA1C: No results for input(s): HGBA1C in the last 72 hours. CBG: Recent Labs  Lab 09/19/17 2115  GLUCAP 269*   Lipid Profile: No results for input(s): CHOL, HDL, LDLCALC, TRIG, CHOLHDL, LDLDIRECT in the last 72 hours. Thyroid Function Tests: No results for input(s): TSH, T4TOTAL, FREET4, T3FREE, THYROIDAB in the last 72 hours. Anemia Panel: No results for  input(s): VITAMINB12, FOLATE, FERRITIN, TIBC, IRON, RETICCTPCT in the last 72 hours. Urine analysis:    Component Value Date/Time   COLORURINE YELLOW 09/19/2017 1753   APPEARANCEUR HAZY (A) 09/19/2017 1753   LABSPEC 1.006 09/19/2017 1753   PHURINE 5.0 09/19/2017 1753   GLUCOSEU >=500 (A) 09/19/2017 1753   GLUCOSEU >=1000 (A) 08/04/2017 1622   HGBUR SMALL (A) 09/19/2017 1753   BILIRUBINUR NEGATIVE 09/19/2017 1753   BILIRUBINUR n 01/09/2011 1012   KETONESUR NEGATIVE 09/19/2017 1753   PROTEINUR NEGATIVE 09/19/2017 1753   UROBILINOGEN 0.2 08/04/2017 1622   NITRITE POSITIVE (A) 09/19/2017 1753   LEUKOCYTESUR LARGE (A) 09/19/2017 1753   Sepsis Labs: @LABRCNTIP (procalcitonin:4,lacticidven:4) )No results found for this or any previous visit (from the past 240 hour(s)).   Radiological Exams on Admission: Dg Chest 2 View  Result Date: 09/19/2017 CLINICAL DATA:  Fever, hypertension and headache. EXAM: CHEST  2 VIEW COMPARISON:  None. FINDINGS: The cardiomediastinal silhouette is unremarkable. There is no evidence of focal airspace disease, pulmonary edema, suspicious pulmonary nodule/mass, pleural effusion, or pneumothorax. No acute bony abnormalities are identified. IMPRESSION: No active cardiopulmonary disease. Electronically Signed   By: Margarette Canada M.D.   On: 09/19/2017 17:58     EKG: Independently reviewed.  Sinus rhythm, tachycardia, QTC 413.   Assessment/Plan Principal Problem:   UTI (urinary tract infection) Active Problems:   Type II diabetes mellitus with renal manifestations (HCC)   Essential hypertension   History of CVA (cerebrovascular accident)   Smoker   Sepsis ([place])   Chronic kidney disease (CKD), stage II (mild)   Pancytopenia (HCC)   Chronic diastolic CHF (congestive heart failure) ([place])    Sepsis due to UTI: Patient is septic with neutropenia, fever, tachycardia and tachypnea. Lactic acid is elevated 7.28, 5.49, currently hemodynamically stable.   - Admit to  telemetry bed as inpt -  Pt was initially started with vancomycin, Levaquin and aztreonam. Will d/c vancomycin and Levaquin. Continue aztreonam - Follow up results of urine and blood cx and amend antibiotic regimen if needed per sensitivity results - prn Zofran for  nausea - will get Procalcitonin and trend lactic acid levels per sepsis protocol. - IVF: 2.5 L of NS bolus in ED, followed by  75 cc/h  - f/u Bx and Ux  Type II diabetes mellitus with renal manifestations-CKD-II ([place]): Last A1c 6.8 on 03/18/17, well controled. Patient is taking  glipizide and Januvia at home -SSI  Essential hypertension: -hold Cozaar and Lasix due to worsening renal function and high risk of developing hypotension -Continue atenolol -When necessary IV hydralazine  History of CVA (cerebrovascular accident): -ASA  Tobacco abuse: -Did counseling about importance of quitting smoking -Nicotine patch  Chronic kidney disease (CKD), stage II (mild): Baseline creatinine 1.0. Currently creatinine 1.12, BUN 11, slightly worsening -Follow up renal function. BMP  Chronic diastolic CHF: 2-D echo 8/46/65 showed EF 60-65% with grade 1 diastolic dysfunction. Patient has 2+ leg edema, but no JVD. Cxr has no pulmonary edema. Patient does not have SOB. CHF seems to be compensated. -Hold her Lasix due to sepsis  -Check BNP  Pancytopenia ([place]): WBC 2.0, hemoglobin 7.6<--8.1 on 08/14/17, platelets 113. Partially related to the ongoing infection and sepsis. -anemia panel -Peripheral smear   DVT ppx:  SQ Lovenox Code Status: Full code Family Communication: None at bed side.     Disposition Plan:  Anticipate discharge back to previous home environment Consults called:  none Admission status:  Inpatient/tele      Date of Service 09/19/2017    Ivor Costa [facility] Pager [phone]  If 7PM-7AM, please contact night-coverage [email or web address] Password [facility] 09/19/2017, 9:55 PM

## 2017-09-19 NOTE — Progress Notes (Signed)
Pharmacy Antibiotic Note  Anita Mccormick is a 72 y.o. female admitted on 09/19/2017 with sepsis.  Pharmacy has been consulted for Aztreonam, Levaquin and Vancomycin dosing. WBC low at 2. LA 7.28>5.49. SCr 1.12. CrCl ~ 40 mL/min   Plan: -Vancomycin 1500 mg IV once, then vancomycin 750 mg IV Q 12 hours -Levaquin 750 mg IV Q 48 hours -Aztreonam 1 gm IV Q 8 hours  -Monitor CBC, renal fx, cultures and clinical progress -VT at SS    Temp (24hrs), Avg:103.2 F (39.6 C), Min:103.2 F (39.6 C), Max:103.2 F (39.6 C)  Recent Labs  Lab 09/19/17 1650 09/19/17 1706  WBC 2.0*  --   LATICACIDVEN  --  7.28*    CrCl cannot be calculated (Patient's most recent lab result is older than the maximum 21 days allowed.).    Allergies  Allergen Reactions  . Ace Inhibitors Cough    ????  . Chlorthalidone     REACTION: unspecified  . Metformin     REACTION: gi side effects  . Penicillins     REACTION: urticaria (hives)  . Sulfamethoxazole     REACTION: questionable    Antimicrobials this admission: Aztreonam 2/23 >>  Vancomycin 2/23 >>  Levaquin 2/23>>  Dose adjustments this admission: None   Microbiology results: 2/23 BCx:   Thank you for allowing pharmacy to be a part of this patient's care.  Albertina Parr, PharmD., BCPS Clinical Pharmacist Pager [phone]

## 2017-09-19 NOTE — ED Provider Notes (Signed)
[facility] Provider Note   CSN: [id number] Arrival date & time: 09/19/17  1607     History   Chief Complaint Chief Complaint  Patient presents with  . Headache  . Hypertension  . Fever    103.2    HPI Anita Mccormick is a 72 y.o. female with history of type 2 diabetes, hypertension, distant history of breast cancer in remission who presents with a 2-day history of mild headache as well as about 1 week history of urinary frequency and low back pain.  She reports she thinks her blood pressure has been elevated.  Patient has had about 1 month of lower extremity edema.  Patient saw her cardiologist for this and changed her Lasix dose to every other day and decreased her losartan dose for her hypertension.  Patient denies any chest pain, but has had some shortness of breath.  She denies any cough, abdominal pain, nausea, vomiting or skin changes or wounds.  She was noted to have a fever in triage, however patient was unaware and is unsure how long this is been going on.  HPI  Past Medical History:  Diagnosis Date  . Cancer [facility])    history of no adjunctive RX( no chemo no RT)  . Cerebrovascular accident ([place])   . COLONIC POLYPS, HX OF 12/10/2006   Qualifier: Diagnosis of  By: Leanne Chang MD, Bruce    . Diabetes mellitus    Type 2  . Diabetes mellitus type II, controlled ([place]) 12/10/2006   Poor control on Januvia 178m and glipizide 146mXL Lab Results  Component Value Date   HGBA1C 8.3* 11/02/2014       . Eczema   . Fatty liver disease, nonalcoholic 1240/81/4481 Per Dr. SwLeanne Changab Results  Component Value Date   ALT 31 11/02/2014   AST 57* 11/02/2014   ALKPHOS 104 11/02/2014   BILITOT 1.1 11/02/2014      . Hypertension     Patient Active Problem List   Diagnosis Date Noted  . Sepsis ([facility]02/23/2019  . UTI (urinary tract infection) 09/19/2017  . Chronic kidney disease (CKD), stage II (mild) 09/19/2017  . Pancytopenia ([facility]02/23/2019  . Chronic diastolic  CHF (congestive heart failure) ([facility]02/23/2019  . Statins contraindicated 06/12/2017  . Hepatotoxicity due to statin drug 06/12/2017  . History of adenomatous polyp of colon 03/18/2017  . Viral URI with cough 01/17/2017  . Thrombocytopenia ([facility]CE[facility]05/24/2017  . Insomnia 08/21/2015  . Smoker 05/21/2015  . Eczema 05/21/2015  . Fatty liver disease, nonalcoholic 1285/63/1497. Hyperlipidemia 11/03/2007  . History of CVA (cerebrovascular accident) 10/20/2007  . Type II diabetes mellitus with renal manifestations ([facility]05/15/2008  . Essential hypertension 12/10/2006  . BREAST CANCER, HX OF 12/10/2006    Past Surgical History:  Procedure Laterality Date  . CATARACT EXTRACTION Left   . MASTECTOMY Left   . TUBAL LIGATION      OB History    No data available       Home Medications    Prior to Admission medications   Medication Sig Start Date End Date Taking? Authorizing Provider  aspirin 325 MG tablet Take 325 mg by mouth daily.     Yes [provider]  atenolol (TENORMIN) 100 MG tablet TAKE 1 TABLET BY MOUTH TWICE A DAY Patient taking differently: TAKE 1 TABLET (100 MG) BY MOUTH TWICE A DAY 10/20/16  Yes HuMarin OlpMD  Calcium Carbonate-Vitamin D (CALCIUM PLUS VITAMIN  D) 600-100 MG-UNIT CAPS Take by mouth 2 (two) times daily.     Yes [provider]  furosemide (LASIX) 20 MG tablet Take 1 tablet (20 mg total) by mouth every other day. AND HOLD IF NO SWELLING 09/07/17  Yes Lendon Colonel, NP  glipiZIDE (GLUCOTROL XL) 10 MG 24 hr tablet Take 1 tablet (10 mg total) by mouth 2 (two) times daily. 09/16/17  Yes Marin Olp, MD  JANUVIA 100 MG tablet TAKE 1 TABLET BY MOUTH EVERY DAY Patient taking differently: TAKE 1 TABLET (100 MG)  BY MOUTH EVERY DAY 09/02/17  Yes Billie Ruddy, MD  losartan (COZAAR) 50 MG tablet Take 1 tablet (50 mg total) by mouth daily. 09/07/17  Yes Lendon Colonel, NP  naproxen sodium (ALEVE) 220 MG tablet Take 220 mg by mouth  daily as needed (for pain).   Yes [provider]  glucose blood (ONE TOUCH ULTRA TEST) test strip 1 each by Other route 2 (two) times daily. Use as instructed 06/30/13   Swords, Darrick Penna, MD  potassium chloride SA (K-DUR,KLOR-CON) 20 MEQ tablet Take 1 tablet (20 mEq total) by mouth daily. Patient not taking: Reported on 09/19/2017 08/08/17   Billie Ruddy, MD  traZODone (DESYREL) 50 MG tablet Take 0.5-1 tablets (25-50 mg total) by mouth at bedtime as needed for sleep. Patient not taking: Reported on 09/19/2017 08/21/15   Marin Olp, MD    Family History Family History  Problem Relation Age of Onset  . Stroke Mother   . Cancer Father        prostate  . Pancreatic cancer Sister     Social History Social History   Tobacco Use  . Smoking status: Current Every Day Smoker    Packs/day: 0.10    Types: Cigarettes  . Smokeless tobacco: Never Used  . Tobacco comment: 3 cigs a day :not smoking due to illness (01/17/17)  Substance Use Topics  . Alcohol use: Yes    Alcohol/week: 0.0 oz    Comment: a drink every now and again  . Drug use: No     Allergies   Ace inhibitors; Chlorthalidone; Metformin; Penicillins; and Sulfamethoxazole   Review of Systems Review of Systems  Constitutional: Positive for fatigue and fever. Negative for chills.  HENT: Negative for facial swelling and sore throat.   Respiratory: Negative for shortness of breath.   Cardiovascular: Negative for chest pain.  Gastrointestinal: Negative for abdominal pain, nausea and vomiting.  Genitourinary: Positive for frequency. Negative for dysuria.  Musculoskeletal: Positive for back pain (lumbar). Negative for neck pain.  Skin: Negative for rash and wound.  Neurological: Positive for headaches.  Psychiatric/Behavioral: The patient is not nervous/anxious.      Physical Exam Updated Vital Signs BP (!) 110/49 (BP Location: Right Arm)   Pulse 92   Temp 99.3 F (37.4 C) (Oral)   Resp 20   Ht 5' 2"   (1.575 m)   Wt 74.4 kg (164 lb 0.4 oz)   SpO2 100%   BMI 30.00 kg/m   Physical Exam  Constitutional: She appears well-developed and well-nourished. No distress.  HENT:  Head: Normocephalic and atraumatic.  Mouth/Throat: Oropharynx is clear and moist. No oropharyngeal exudate.  Eyes: Conjunctivae and EOM are normal. Pupils are equal, round, and reactive to light. Right eye exhibits no discharge. Left eye exhibits no discharge. No scleral icterus.  Neck: Normal range of motion. Neck supple. No thyromegaly present.  Cardiovascular: Normal rate, regular rhythm, normal heart sounds  and intact distal pulses. Exam reveals no gallop and no friction rub.  No murmur heard. Pulmonary/Chest: Effort normal and breath sounds normal. No stridor. No respiratory distress. She has no wheezes. She has no rales.  Abdominal: Soft. Bowel sounds are normal. She exhibits no distension. There is no tenderness. There is no rebound and no guarding.  Musculoskeletal: She exhibits edema (LE edema 2+).  Lymphadenopathy:    She has no cervical adenopathy.  Neurological: She is alert. Coordination normal.  CN 3-12 intact; normal sensation throughout; 5/5 strength in all 4 extremities; equal bilateral grip strength  Skin: Skin is warm and dry. No rash noted. She is not diaphoretic. No pallor.  On full skin exam, no wounds or rashes noted  Psychiatric: She has a normal mood and affect.  Nursing note and vitals reviewed.    ED Treatments / Results  Labs (all labs ordered are listed, but only abnormal results are displayed) Labs Reviewed  COMPREHENSIVE METABOLIC PANEL - Abnormal; Notable for the following components:      Result Value   CO2 20 (*)    Glucose, Bld 301 (*)    Creatinine, Ser 1.12 (*)    Albumin 2.7 (*)    AST 68 (*)    Alkaline Phosphatase 194 (*)    GFR calc non Af Amer 49 (*)    GFR calc Af Amer 56 (*)    All other components within normal limits  CBC WITH DIFFERENTIAL/PLATELET - Abnormal;  Notable for the following components:   WBC 2.0 (*)    RBC 2.66 (*)    Hemoglobin 7.6 (*)    HCT 25.4 (*)    MCHC 29.9 (*)    RDW 17.2 (*)    Platelets 113 (*)    Neutro Abs 1.6 (*)    Lymphs Abs 0.3 (*)    All other components within normal limits  URINALYSIS, ROUTINE W REFLEX MICROSCOPIC - Abnormal; Notable for the following components:   APPearance HAZY (*)    Glucose, UA >=500 (*)    Hgb urine dipstick SMALL (*)    Nitrite POSITIVE (*)    Leukocytes, UA LARGE (*)    Bacteria, UA FEW (*)    Squamous Epithelial / LPF 0-5 (*)    All other components within normal limits  BRAIN NATRIURETIC PEPTIDE - Abnormal; Notable for the following components:   B Natriuretic Peptide 334.9 (*)    All other components within normal limits  I-STAT CG4 LACTIC ACID, ED - Abnormal; Notable for the following components:   Lactic Acid, Venous 7.28 (*)    All other components within normal limits  I-STAT CG4 LACTIC ACID, ED - Abnormal; Notable for the following components:   Lactic Acid, Venous 5.49 (*)    All other components within normal limits  CBG MONITORING, ED - Abnormal; Notable for the following components:   Glucose-Capillary 269 (*)    All other components within normal limits  CULTURE, BLOOD (ROUTINE X 2)  CULTURE, BLOOD (ROUTINE X 2)  URINE CULTURE  INFLUENZA PANEL BY PCR (TYPE A & B)  PROCALCITONIN  SAVE SMEAR  LACTIC ACID, PLASMA  LACTIC ACID, PLASMA  VITAMIN B12  FOLATE  IRON AND TIBC  FERRITIN  RETICULOCYTES  PATHOLOGIST SMEAR REVIEW  BASIC METABOLIC PANEL  CBC    EKG  EKG Interpretation  Date/Time:  Saturday September 19 2017 17:24:48 EST Ventricular Rate:  104 PR Interval:    QRS Duration: 69 QT Interval:  314 QTC Calculation: 413 R  Axis:   75 Text Interpretation:  Sinus tachycardia Nonspecific repol abnormality, diffuse leads Abnormal ekg Confirmed by Noemi Chapel [phone]) on 09/19/2017 5:33:34 PM       Radiology Dg Chest 2 View  Result Date:  09/19/2017 CLINICAL DATA:  Fever, hypertension and headache. EXAM: CHEST  2 VIEW COMPARISON:  None. FINDINGS: The cardiomediastinal silhouette is unremarkable. There is no evidence of focal airspace disease, pulmonary edema, suspicious pulmonary nodule/mass, pleural effusion, or pneumothorax. No acute bony abnormalities are identified. IMPRESSION: No active cardiopulmonary disease. Electronically Signed   By: Margarette Canada M.D.   On: 09/19/2017 17:58    Procedures Procedures (including critical care time)  CRITICAL CARE Performed by: Frederica Kuster   Total critical care time: 30 minutes  Critical care time was exclusive of separately billable procedures and treating other patients.  Critical care was necessary to treat or prevent imminent or life-threatening deterioration.  Critical care was time spent personally by me on the following activities: development of treatment plan with patient and/or surrogate as well as nursing, discussions with consultants, evaluation of patient's response to treatment, examination of patient, obtaining history from patient or surrogate, ordering and performing treatments and interventions, ordering and review of laboratory studies, ordering and review of radiographic studies, pulse oximetry and re-evaluation of patient's condition.  Sepsis - Repeat Assessment  Performed at:    [place]     Blood pressure (!) 145/58, pulse (!) 103, temperature (!) 103.2 F (39.6 C), temperature source Oral, resp. rate (!) 22, height 5' 2"  (1.575 m), weight 77.1 kg (170 lb), SpO2 99 %.  Heart:     Tachycardic  Lungs:    CTA  Capillary Refill:   <2 sec  Peripheral Pulse:   Radial pulse palpable  Skin:     Normal Color     Medications Ordered in ED Medications  aztreonam (AZACTAM) 1 g in sodium chloride 0.9 % 100 mL IVPB (not administered)  acetaminophen (TYLENOL) tablet 650 mg (not administered)  aspirin tablet 325 mg (not administered)  atenolol (TENORMIN)  tablet 100 mg (0 mg Oral Hold 09/19/17 2118)  calcium-vitamin D (OSCAL WITH D) 500-200 MG-UNIT per tablet 1 tablet (not administered)  nicotine (NICODERM CQ - dosed in mg/24 hours) patch 21 mg (21 mg Transdermal Patch Applied 09/19/17 2118)  insulin aspart (novoLOG) injection 0-9 Units (not administered)  insulin aspart (novoLOG) injection 0-5 Units (3 Units Subcutaneous Given 09/19/17 2117)  enoxaparin (LOVENOX) injection 40 mg (not administered)  ondansetron (ZOFRAN) tablet 4 mg (not administered)    Or  ondansetron (ZOFRAN) injection 4 mg (not administered)  zolpidem (AMBIEN) tablet 5 mg (5 mg Oral Given 09/19/17 2118)  hydrALAZINE (APRESOLINE) injection 5 mg (not administered)  0.9 %  sodium chloride infusion ( Intravenous Transfusing/Transfer 09/19/17 2140)  acetaminophen (TYLENOL) tablet 650 mg (650 mg Oral Given 09/19/17 1643)  sodium chloride 0.9 % bolus 1,000 mL (0 mLs Intravenous Stopped 09/19/17 1854)    And  sodium chloride 0.9 % bolus 1,000 mL (0 mLs Intravenous Stopped 09/19/17 2002)    And  sodium chloride 0.9 % bolus 500 mL (0 mLs Intravenous Stopped 09/19/17 2002)  levofloxacin (LEVAQUIN) IVPB 750 mg (0 mg Intravenous Stopped 09/19/17 1916)  aztreonam (AZACTAM) 2 g in sodium chloride 0.9 % 100 mL IVPB (0 g Intravenous Stopped 09/19/17 2002)  vancomycin (VANCOCIN) 1,500 mg in sodium chloride 0.9 % 500 mL IVPB (0 mg Intravenous Stopped 09/19/17 2024)     Initial Impression / Assessment and Plan /  ED Course  I have reviewed the triage vital signs and the nursing notes.  Pertinent labs & imaging results that were available during my care of the patient were reviewed by me and considered in my medical decision making (see chart for details).     Patient with suspected urosepsis.  Patient also with leukopenia (WBC 2.0 ), anemia (hemoglobin 7.6 ), and thrombocytopenia (platelet 113K ). Patient presenting with mild headache, fever, urinary frequency and low back pain.  Initial lactate  7.28.  Weightbase fluids initiated as well as broad-spectrum antibiotics.  Patient allergic to penicillin.  Aztreonam, vancomycin, Levaquin initiated prior to return of UA.  UA shows large leukocytes, positive nitrites, too numerous to count WBCs.  Influenza negative.  CMP shows AST 68, alk phos 194, creatinine 1.12.  Chest x-ray is negative.  Abdomen nontender.  My attending physician, Dr. Sabra Heck, consulted [facility] and spoke with Dr. Blaine Hamper who will admit the patient for further treatment.  Patient also evaluated by Dr. Sabra Heck who guided the patient's management and agrees with plan.  Final Clinical Impressions(s) / ED Diagnoses   Final diagnoses:  Sepsis, due to unspecified organism ([place])  Leukopenia, unspecified type  Anemia, unspecified type  Thrombocytopenia [facility])    ED Discharge Orders    None       Frederica Kuster, PA-C 09/19/17 2230    Noemi Chapel, MD 09/20/17 [phone]

## 2017-09-19 NOTE — ED Notes (Signed)
Patient transported to X-ray 

## 2017-09-19 NOTE — ED Provider Notes (Signed)
The patient is a 72 year old female, she presents to the hospital today because of a headache and feeling generally weak.  She denies having any other symptoms including abdominal pain back pain chest pain coughing or shortness of breath however she does endorse some urinary frequency and mild dysuria.  On exam the patient is in fact borderline tachycardic, she has a minimally tender suprapubic abdomen but no other abdominal tenderness, clear lungs without rales, bilateral peripheral symmetrical pitting edema of the legs to above the knees.  Of note the patient has a normal ejection fraction but is followed by cardiology and has been recently treated with Lasix at a higher dose than previously treated.  The patient does have a fever, she has an elevated lactic acid that is over 7 and qualifies for a code sepsis.  The source is not very clear however she has no pulmonary symptoms, no flulike symptoms, she will be evaluated with labs, blood cultures, broad-spectrum antibiotics and 30 cc/kg of IV fluids as the patient is critically ill with sepsis.  I do not see any other reason for the patient to have an elevated lactic acid based on her story or her exam.  The patient will need to be admitted to the hospital.  Of note the patient has multiple critical lab values including being thrombocytopenic, severely anemic, having sepsis from a urinary tract infection.  I discussed the care with the hospitalist who will admit.  .Critical Care Performed by: Noemi Chapel, MD Authorized by: Noemi Chapel, MD   Critical care provider statement:    Critical care time (minutes):  35   Critical care time was exclusive of:  Separately billable procedures and treating other patients and teaching time   Critical care was necessary to treat or prevent imminent or life-threatening deterioration of the following conditions:  Sepsis   Critical care was time spent personally by me on the following activities:  Blood draw for  specimens, development of treatment plan with patient or surrogate, discussions with consultants, evaluation of patient's response to treatment, examination of patient, obtaining history from patient or surrogate, ordering and performing treatments and interventions, ordering and review of laboratory studies, ordering and review of radiographic studies, pulse oximetry, re-evaluation of patient's condition and review of old charts    Medical screening examination/treatment/procedure(s) were conducted as a shared visit with non-physician practitioner(s) and myself.  I personally evaluated the patient during the encounter.  Clinical Impression:   Final diagnoses:  Sepsis, due to unspecified organism ([place])  Leukopenia, unspecified type  Anemia, unspecified type  Thrombocytopenia ([facility])         Noemi Chapel, MD 09/20/17 1506

## 2017-09-19 NOTE — ED Triage Notes (Signed)
Pt presents with high BP and headache since yesterday; pt comes with AVS stating BP medication doses were decreased on 09/07/17

## 2017-09-20 ENCOUNTER — Encounter (HOSPITAL_COMMUNITY): Payer: Self-pay | Admitting: *Deleted

## 2017-09-20 DIAGNOSIS — R7881 Bacteremia: Secondary | ICD-10-CM

## 2017-09-20 DIAGNOSIS — D696 Thrombocytopenia, unspecified: Secondary | ICD-10-CM

## 2017-09-20 DIAGNOSIS — N179 Acute kidney failure, unspecified: Secondary | ICD-10-CM

## 2017-09-20 DIAGNOSIS — D649 Anemia, unspecified: Secondary | ICD-10-CM

## 2017-09-20 DIAGNOSIS — Z8673 Personal history of transient ischemic attack (TIA), and cerebral infarction without residual deficits: Secondary | ICD-10-CM

## 2017-09-20 DIAGNOSIS — D72819 Decreased white blood cell count, unspecified: Secondary | ICD-10-CM

## 2017-09-20 DIAGNOSIS — A4151 Sepsis due to Escherichia coli [E. coli]: Principal | ICD-10-CM

## 2017-09-20 DIAGNOSIS — E118 Type 2 diabetes mellitus with unspecified complications: Secondary | ICD-10-CM

## 2017-09-20 LAB — BASIC METABOLIC PANEL
Anion gap: 12 (ref 5–15)
Anion gap: 11 (ref 5–15)
BUN: 17 mg/dL (ref 6–20)
BUN: 20 mg/dL (ref 6–20)
CHLORIDE: 105 mmol/L (ref 101–111)
CO2: 19 mmol/L — ABNORMAL LOW (ref 22–32)
CO2: 21 mmol/L — ABNORMAL LOW (ref 22–32)
CREATININE: 1.49 mg/dL — AB (ref 0.44–1.00)
CREATININE: 1.41 mg/dL — AB (ref 0.44–1.00)
Calcium: 8.6 mg/dL — ABNORMAL LOW (ref 8.9–10.3)
Calcium: 8.8 mg/dL — ABNORMAL LOW (ref 8.9–10.3)
Chloride: 105 mmol/L (ref 101–111)
GFR calc Af Amer: 43 mL/min — ABNORMAL LOW (ref >60)
GFR calc non Af Amer: 34 mL/min — ABNORMAL LOW (ref >60)
GFR calc non Af Amer: 37 mL/min — ABNORMAL LOW (ref >60)
GFR, EST AFRICAN AMERICAN: 40 mL/min — AB (ref >60)
Glucose, Bld: 345 mg/dL — ABNORMAL HIGH (ref 65–99)
Glucose, Bld: 237 mg/dL — ABNORMAL HIGH (ref 65–99)
POTASSIUM: 3.8 mmol/L (ref 3.5–5.1)
Potassium: 4.4 mmol/L (ref 3.5–5.1)
SODIUM: 136 mmol/L (ref 135–145)
Sodium: 137 mmol/L (ref 135–145)

## 2017-09-20 LAB — CBC WITH DIFFERENTIAL/PLATELET
Basophils Absolute: 0 10*3/uL (ref 0.0–0.1)
Basophils Relative: 0 %
Eosinophils Absolute: 0.1 10*3/uL (ref 0.0–0.7)
Eosinophils Relative: 0 %
HCT: 33.1 % — ABNORMAL LOW (ref 36.0–46.0)
HEMOGLOBIN: 10.4 g/dL — AB (ref 12.0–15.0)
LYMPHS ABS: 1.2 10*3/uL (ref 0.7–4.0)
LYMPHS PCT: 8 %
MCH: 28.7 pg (ref 26.0–34.0)
MCHC: 31.4 g/dL (ref 30.0–36.0)
MCV: 91.2 fL (ref 78.0–100.0)
MONOS PCT: 11 %
Monocytes Absolute: 1.8 10*3/uL — ABNORMAL HIGH (ref 0.1–1.0)
NEUTROS PCT: 81 %
Neutro Abs: 12.9 10*3/uL — ABNORMAL HIGH (ref 1.7–7.7)
Platelets: 118 10*3/uL — ABNORMAL LOW (ref 150–400)
RBC: 3.63 MIL/uL — AB (ref 3.87–5.11)
RDW: 16.3 % — ABNORMAL HIGH (ref 11.5–15.5)
WBC: 16 10*3/uL — AB (ref 4.0–10.5)

## 2017-09-20 LAB — BLOOD CULTURE ID PANEL (REFLEXED)

## 2017-09-20 LAB — RETICULOCYTES
RBC.: 2.38 MIL/uL — ABNORMAL LOW (ref 3.87–5.11)
Retic Count, Absolute: 90.4 10*3/uL (ref 19.0–186.0)
Retic Ct Pct: 3.8 % — ABNORMAL HIGH (ref 0.4–3.1)

## 2017-09-20 LAB — FOLATE: FOLATE: 4.3 ng/mL — AB (ref >5.9)

## 2017-09-20 LAB — GLUCOSE, CAPILLARY
GLUCOSE-CAPILLARY: 237 mg/dL — AB (ref 65–99)
Glucose-Capillary: 290 mg/dL — ABNORMAL HIGH (ref 65–99)
Glucose-Capillary: 342 mg/dL — ABNORMAL HIGH (ref 65–99)
Glucose-Capillary: 214 mg/dL — ABNORMAL HIGH (ref 65–99)

## 2017-09-20 LAB — CBC
HCT: 21.3 % — ABNORMAL LOW (ref 36.0–46.0)
Hemoglobin: 6.1 g/dL — CL (ref 12.0–15.0)
MCH: 27.4 pg (ref 26.0–34.0)
MCHC: 28.6 g/dL — ABNORMAL LOW (ref 30.0–36.0)
MCV: 95.5 fL (ref 78.0–100.0)
PLATELETS: 106 10*3/uL — AB (ref 150–400)
RBC: 2.23 MIL/uL — AB (ref 3.87–5.11)
RDW: 17.4 % — AB (ref 11.5–15.5)
WBC: 17.1 10*3/uL — AB (ref 4.0–10.5)

## 2017-09-20 LAB — IRON AND TIBC
IRON: 19 ug/dL — AB (ref 28–170)
Saturation Ratios: 5 % — ABNORMAL LOW (ref 10.4–31.8)
TIBC: 351 ug/dL (ref 250–450)
UIBC: 332 ug/dL

## 2017-09-20 LAB — ABO/RH: ABO/RH(D): B POS

## 2017-09-20 LAB — LACTIC ACID, PLASMA
LACTIC ACID, VENOUS: 4.6 mmol/L — AB (ref 0.5–1.9)
LACTIC ACID, VENOUS: 4.7 mmol/L — AB (ref 0.5–1.9)

## 2017-09-20 LAB — VITAMIN B12: VITAMIN B 12: 497 pg/mL (ref 180–914)

## 2017-09-20 LAB — PREPARE RBC (CROSSMATCH)

## 2017-09-20 LAB — FERRITIN: Ferritin: 14 ng/mL (ref 11–307)

## 2017-09-20 MED ORDER — SODIUM CHLORIDE 0.9 % IV SOLN
2.0000 g | INTRAVENOUS | Status: DC
  Filled 2017-09-20: qty 2

## 2017-09-20 MED ORDER — SODIUM CHLORIDE 0.9 % IV BOLUS (SEPSIS)
500.0000 mL | Freq: Once | INTRAVENOUS | Status: AC
  Administered 2017-09-20: 500 mL via INTRAVENOUS

## 2017-09-20 MED ORDER — SODIUM CHLORIDE 0.9 % IV SOLN
Freq: Once | INTRAVENOUS | Status: AC
  Administered 2017-09-20: 16:00:00 via INTRAVENOUS

## 2017-09-20 MED ORDER — SODIUM CHLORIDE 0.9 % IV SOLN
2.0000 g | INTRAVENOUS | Status: DC
  Administered 2017-09-20 – 2017-09-22 (×3): 2 g via INTRAVENOUS
  Filled 2017-09-20 (×4): qty 2

## 2017-09-20 MED ORDER — SODIUM CHLORIDE 0.9 % IV SOLN
Freq: Once | INTRAVENOUS | Status: AC
  Administered 2017-09-20: 11:00:00 via INTRAVENOUS

## 2017-09-20 NOTE — Progress Notes (Signed)
Lab reported that patient has a Hgb of 6.1. This result was texted to MD on call.

## 2017-09-20 NOTE — Progress Notes (Signed)
Dr Kennon Holter was notified via text of most recent lactic acid (4.7) Patient remains on  N/S @ 75 Ml/Hr.

## 2017-09-20 NOTE — Progress Notes (Signed)
Pharmacy Antibiotic Note  Anita Mccormick is a 72 y.o. female admitted on 09/19/2017 with bacteremia.  Pharmacy has been consulted for Cefepime dosing. Patient is currently on IV aztreonam but patient seems to have worsening leukocytosis. LA remains elevated at 4.7. CrCl ~ 35-40 mL/min    Plan: -Stop aztreonam and start cefepime 2 gm IV Q 24 hours  -Monitor CBC, renal fx, cultures and clinical progress -Patient has hives listed to PCN. Will make RN aware to monitor for adverse drug reaction. MD aware   Height: 5\' 2"  (157.5 cm) Weight: 166 lb 10.7 oz (75.6 kg) IBW/kg (Calculated) : 50.1  Temp (24hrs), Avg:100.2 F (37.9 C), Min:99.1 F (37.3 C), Max:103.2 F (39.6 C)  Recent Labs  Lab 09/19/17 1650 09/19/17 1706 09/19/17 1923 09/19/17 2047 09/20/17 0023 09/20/17 0355  WBC 2.0*  --   --   --   --  17.1*  CREATININE 1.12*  --   --   --   --  1.49*  LATICACIDVEN  --  7.28* 5.49* 4.6* 4.7*  --     Estimated Creatinine Clearance: 33.4 mL/min (A) (by C-G formula based on SCr of 1.49 mg/dL (H)).    Allergies  Allergen Reactions  . Ace Inhibitors Cough    ????  . Chlorthalidone     REACTION: unspecified  . Metformin     REACTION: gi side effects  . Penicillins     REACTION: urticaria (hives)  . Sulfamethoxazole     REACTION: questionable    Antimicrobials this admission: Aztreonam 2/23 >> 2/24 Cefepime 2/24 >>   Dose adjustments this admission: None  Microbiology results: 2/23 BCx: 1/2 E.coli 2/24 UCx:     Thank you for allowing pharmacy to be a part of this patient's care.  Albertina Parr, PharmD., BCPS Clinical Pharmacist Pager [phone]

## 2017-09-20 NOTE — Plan of Care (Signed)
  Skin Integrity: Risk for impaired skin integrity will decrease 09/20/2017 0405 - Completed/Met by Evert Kohl, RN   Pain Managment: General experience of comfort will improve 09/20/2017 0405 - Completed/Met by Evert Kohl, RN   Elimination: Will not experience complications related to bowel motility 09/20/2017 0405 - Completed/Met by Evert Kohl, RN   Coping: Level of anxiety will decrease 09/20/2017 0405 - Completed/Met by Evert Kohl, RN

## 2017-09-20 NOTE — Progress Notes (Signed)
PROGRESS NOTE    Anita GROSECLOSE  CHE:[id number] DOB: August 08, 1945 DOA: 09/19/2017 PCP: Billie Ruddy, MD   Brief Narrative:  HPI On 09/19/2017 by Dr. Ivor Costa Anita Mccormick is a 72 y.o. female with medical history significant of Hypertension, hyperlipidemia, diabetes mellitus, stroke, breast cancer in remission, tobacco abuse, CKD-2, dCHF, who presents with fever, chills, dysuria and urinary frequency.  Pt state that she has been feeling bad in the past several days. She has fever and chill. She reports dysuria, urgency and frequency. No flank pain. She also reports a headache. Denies neck rigidity. No unilateral weakness. Patient has nausea and vomited twice, but no diarrhea or abdominal pain. Patient does not have chest pain, shortness breath, cough, runny nose or sore throat. Patient states that her blood pressure has also been elevated recently. She has bilateral leg edema.  Assessment & Plan   Sepsis secondary to urinary tract infection/Ecoli bacteremia -Patient presented with fever, tachycardic, tachypnea, leukopenia with elevated lactic acid level -Currently patient's vital signs are improving however leukocytosis now worsening to 17.1 -UA showed few bacteria, TNTC WBC, positive nitrites, large leukocytes -Urine culture pending, was added on after patient received antibiotics -Blood culture showed gram-negative rods, BCID positive for enterobacteriaceae and E. Coli -Patient initially placed on Bactrim due to penicillin allergy -Will transition patient to cefepime and continue to monitor closely -Continue IV fluid, monitor CBC and leukocytosis  Normocytic Anemia/Pancytopenia -Hemoglobin currently 6.1 baseline hemoglobin 12.8 back in 2018, was 8.1 in January 2019 -Will transfuse 2 units PRBC and monitor hemoglobin -Anemia panel: Iron 19, Ferritin 14, saturation ratio5 -FOBT ordered -as above, patient was leukopenic on admission, now with leukocytosis- suspect secondary to sepsis    -platelets currently 106, baseline 130s -Continue to monitor CBC -will discontinue lovenox  Acute kidney injury  -Upon review of patient's chart, Baseline creatinine approximately 0.8-0.9, currently 1.49 -Suspect secondary to sepsis as well as anemia -Continue IV fluids as well as blood transfusion -Monitor BMP  Diabetes mellitus, type II -Januvia, glipizide held -Placed on insulin sliding scale and CBG monitoring  Essential hypertension -Lasix/Cozaar held due to worsening renal function -Continue atenolol, IV hydralazine as needed  Lower extremity edema/grade 1 diastolic dysfunction -Patient has been following with cardiology for this chronic lower extremity edema and leg pain.  She has been on Lasix as an outpatient. -Echocardiogram 08/11/2017 showed an EF of 53-61%, grade 1 diastolic dysfunction with mild LVH -Cardiology not referring to this as CHF -Lasix currently held due to sepsis and AKI  History of CVA  -Currently no neurological deficits, continue aspirin  Tobacco abuse -Discussed smoking cessation -Continue nicotine patch  DVT Prophylaxis  SCDs  Code Status: Full  Family Communication: Daughter at bedside  Disposition Plan: Admitted.  Continue to treat sepsis, suspect home within 48-72hours  Consultants None  Procedures  None  Antibiotics   Anti-infectives (From admission, onward)   Start     Dose/Rate Route Frequency Ordered Stop   09/21/17 0800  levofloxacin (LEVAQUIN) IVPB 750 mg  Status:  Discontinued     750 mg 100 mL/hr over 90 Minutes Intravenous Every 48 hours 09/19/17 1941 09/19/17 2035   09/20/17 0800  vancomycin (VANCOCIN) IVPB 750 mg/150 ml premix  Status:  Discontinued     750 mg 150 mL/hr over 60 Minutes Intravenous Every 12 hours 09/19/17 1941 09/19/17 2035   09/20/17 0200  aztreonam (AZACTAM) 1 g in sodium chloride 0.9 % 100 mL IVPB     1 g 200 mL/hr  over 30 Minutes Intravenous Every 8 hours 09/19/17 1941     09/19/17 1800   vancomycin (VANCOCIN) 1,500 mg in sodium chloride 0.9 % 500 mL IVPB     1,500 mg 250 mL/hr over 120 Minutes Intravenous  Once 09/19/17 1758 09/19/17 2024   09/19/17 1745  levofloxacin (LEVAQUIN) IVPB 750 mg     750 mg 100 mL/hr over 90 Minutes Intravenous  Once 09/19/17 1739 09/19/17 1916   09/19/17 1745  aztreonam (AZACTAM) 2 g in sodium chloride 0.9 % 100 mL IVPB     2 g 200 mL/hr over 30 Minutes Intravenous  Once 09/19/17 1739 09/19/17 2002   09/19/17 1745  vancomycin (VANCOCIN) IVPB 1000 mg/200 mL premix  Status:  Discontinued     1,000 mg 200 mL/hr over 60 Minutes Intravenous  Once 09/19/17 1739 09/19/17 1758      Subjective:   [name] seen and examined today.  Complains of feeling mildly short of breath as well as tired.  Denies any current abdominal pain, nausea vomiting, chest pain, dizziness or headache.  Objective:   Vitals:   09/19/17 2205 09/20/17 0054 09/20/17 0552 09/20/17 0806  BP: (!) 110/49 (!) 118/47 (!) 105/37 (!) 110/42  Pulse: 92 84 89 87  Resp: 20 18 18 18   Temp: 99.3 F (37.4 C) 99.1 F (37.3 C) 99.9 F (37.7 C) 99.8 F (37.7 C)  TempSrc: Oral Oral Oral Oral  SpO2: 100% 100% 100% 100%  Weight: 74.4 kg (164 lb 0.4 oz)  75.6 kg (166 lb 10.7 oz)   Height: 5\' 2"  (1.575 m)       Intake/Output Summary (Last 24 hours) at 09/20/2017 1050 Last data filed at 09/20/2017 0730 Gross per 24 hour  Intake 928.75 ml  Output 300 ml  Net 628.75 ml   Filed Weights   09/19/17 1823 09/19/17 2205 09/20/17 0552  Weight: 77.1 kg (170 lb) 74.4 kg (164 lb 0.4 oz) 75.6 kg (166 lb 10.7 oz)    Exam  General: Well developed, well nourished, NAD, appears stated age  HEENT: NCAT, PERRLA, EOMI, Anicteic Sclera, mucous membranes moist.   Neck: Supple, no JVD  Cardiovascular: S1 S2 auscultated, soft SEM. Regular rate and rhythm.  Respiratory: Clear to auscultation bilaterally with equal chest rise  Abdomen: Soft, nontender, nondistended, + bowel  sounds  Extremities: warm dry without cyanosis clubbing. ++Lower extremity edema  Neuro: AAOx3, nonfocal, normal strength  Skin: Without rashes exudates or nodules  Psych: Normal affect and demeanor with intact judgement and insight   Data Reviewed: I have personally reviewed following labs and imaging studies  CBC: Recent Labs  Lab 09/19/17 1650 09/20/17 0355  WBC 2.0* 17.1*  NEUTROABS 1.6*  --   HGB 7.6* 6.1*  HCT 25.4* 21.3*  MCV 95.5 95.5  PLT 113* 347*   Basic Metabolic Panel: Recent Labs  Lab 09/19/17 1650 09/20/17 0355  NA 136 136  K 4.5 4.4  CL 102 105  CO2 20* 19*  GLUCOSE 301* 345*  BUN 11 17  CREATININE 1.12* 1.49*  CALCIUM 9.6 8.6*   GFR: Estimated Creatinine Clearance: 33.4 mL/min (A) (by C-G formula based on SCr of 1.49 mg/dL (H)). Liver Function Tests: Recent Labs  Lab 09/19/17 1650  AST 68*  ALT 25  ALKPHOS 194*  BILITOT 1.1  PROT 7.2  ALBUMIN 2.7*   No results for input(s): LIPASE, AMYLASE in the last 168 hours. No results for input(s): AMMONIA in the last 168 hours. Coagulation Profile: No results for  input(s): INR, PROTIME in the last 168 hours. Cardiac Enzymes: No results for input(s): CKTOTAL, CKMB, CKMBINDEX, TROPONINI in the last 168 hours. BNP (last 3 results) Recent Labs    08/04/17 1622 08/11/17 1604  PROBNP 257.0* 137.0*   HbA1C: No results for input(s): HGBA1C in the last 72 hours. CBG: Recent Labs  Lab 09/19/17 2115 09/20/17 0759  GLUCAP 269* 290*   Lipid Profile: No results for input(s): CHOL, HDL, LDLCALC, TRIG, CHOLHDL, LDLDIRECT in the last 72 hours. Thyroid Function Tests: No results for input(s): TSH, T4TOTAL, FREET4, T3FREE, THYROIDAB in the last 72 hours. Anemia Panel: Recent Labs    09/20/17 0023  VITAMINB12 497  FOLATE 4.3*  FERRITIN 14  TIBC 351  IRON 19*  RETICCTPCT 3.8*   Urine analysis:    Component Value Date/Time   COLORURINE YELLOW 09/19/2017 1753   APPEARANCEUR HAZY (A)  09/19/2017 1753   LABSPEC 1.006 09/19/2017 1753   PHURINE 5.0 09/19/2017 1753   GLUCOSEU >=500 (A) 09/19/2017 1753   GLUCOSEU >=1000 (A) 08/04/2017 1622   HGBUR SMALL (A) 09/19/2017 1753   BILIRUBINUR NEGATIVE 09/19/2017 1753   BILIRUBINUR n 01/09/2011 1012   KETONESUR NEGATIVE 09/19/2017 1753   PROTEINUR NEGATIVE 09/19/2017 1753   UROBILINOGEN 0.2 08/04/2017 1622   NITRITE POSITIVE (A) 09/19/2017 1753   LEUKOCYTESUR LARGE (A) 09/19/2017 1753   Sepsis Labs: @LABRCNTIP (procalcitonin:4,lacticidven:4)  ) Recent Results (from the past 240 hour(s))  Blood Culture (routine x 2)     Status: None (Preliminary result)   Collection Time: 09/19/17  5:20 PM  Result Value Ref Range Status   Specimen Description BLOOD BLOOD LEFT FOREARM  Final   Special Requests   Final    BOTTLES DRAWN AEROBIC AND ANAEROBIC Blood Culture adequate volume   Culture  Setup Time   Final    GRAM NEGATIVE RODS ANAEROBIC BOTTLE ONLY CRITICAL RESULT CALLED TO, READ BACK BY AND VERIFIED WITH: B MANCHERIL,PHARMD AT 1004 09/20/17 BY L BENFIELD Performed at [facility], [place] [address] [place], [place] [address]    Culture GRAM NEGATIVE RODS  Final   Report Status PENDING  Incomplete  Blood Culture ID Panel (Reflexed)     Status: Abnormal   Collection Time: 09/19/17  5:20 PM  Result Value Ref Range Status   Enterococcus species NOT DETECTED NOT DETECTED Final   Listeria monocytogenes NOT DETECTED NOT DETECTED Final   Staphylococcus species NOT DETECTED NOT DETECTED Final   Staphylococcus aureus NOT DETECTED NOT DETECTED Final   Streptococcus species NOT DETECTED NOT DETECTED Final   Streptococcus agalactiae NOT DETECTED NOT DETECTED Final   Streptococcus pneumoniae NOT DETECTED NOT DETECTED Final   Streptococcus pyogenes NOT DETECTED NOT DETECTED Final   Acinetobacter baumannii NOT DETECTED NOT DETECTED Final   Enterobacteriaceae species DETECTED (A) NOT DETECTED Final    Comment: Enterobacteriaceae  represent a large family of gram-negative bacteria, not a single organism. CRITICAL RESULT CALLED TO, READ BACK BY AND VERIFIED WITH: B MANCHERIL,PHARMD AT 1004 09/20/17 BY L BENFIELD    Enterobacter cloacae complex NOT DETECTED NOT DETECTED Final   Escherichia coli DETECTED (A) NOT DETECTED Final    Comment: CRITICAL RESULT CALLED TO, READ BACK BY AND VERIFIED WITH: B MANCHERIL,PHARMD AT 1004 09/20/17 BY L BENFIELD    Klebsiella oxytoca NOT DETECTED NOT DETECTED Final   Klebsiella pneumoniae NOT DETECTED NOT DETECTED Final   Proteus species NOT DETECTED NOT DETECTED Final   Serratia marcescens NOT DETECTED NOT DETECTED Final   Carbapenem resistance  NOT DETECTED NOT DETECTED Final   Haemophilus influenzae NOT DETECTED NOT DETECTED Final   Neisseria meningitidis NOT DETECTED NOT DETECTED Final   Pseudomonas aeruginosa NOT DETECTED NOT DETECTED Final   Candida albicans NOT DETECTED NOT DETECTED Final   Candida glabrata NOT DETECTED NOT DETECTED Final   Candida krusei NOT DETECTED NOT DETECTED Final   Candida parapsilosis NOT DETECTED NOT DETECTED Final   Candida tropicalis NOT DETECTED NOT DETECTED Final    Comment: Performed at [facility], [place] [address], [place], [place] [address]      Radiology Studies: Dg Chest 2 View  Result Date: 09/19/2017 CLINICAL DATA:  Fever, hypertension and headache. EXAM: CHEST  2 VIEW COMPARISON:  None. FINDINGS: The cardiomediastinal silhouette is unremarkable. There is no evidence of focal airspace disease, pulmonary edema, suspicious pulmonary nodule/mass, pleural effusion, or pneumothorax. No acute bony abnormalities are identified. IMPRESSION: No active cardiopulmonary disease. Electronically Signed   By: Margarette Canada M.D.   On: 09/19/2017 17:58     Scheduled Meds: . aspirin  325 mg Oral Daily  . atenolol  100 mg Oral BID  . calcium-vitamin D  1 tablet Oral BID  . enoxaparin (LOVENOX) injection  40 mg Subcutaneous Q24H  . insulin aspart   0-5 Units Subcutaneous QHS  . insulin aspart  0-9 Units Subcutaneous TID WC  . nicotine  21 mg Transdermal Daily   Continuous Infusions: . sodium chloride 75 mL/hr at 09/20/17 0415  . sodium chloride    . sodium chloride    . aztreonam Stopped (09/20/17 9767)     LOS: 1 day   Time Spent in minutes   45 minutes  Shloima Clinch D.O. on 09/20/2017 at 10:50 AM  Between 7am to 7pm - Pager - [phone]  After 7pm go to [email or web address] - password [email or web address]  And look for the night coverage person covering for me after hours  [facility] Office  [phone]

## 2017-09-20 NOTE — Psychosocial Assessment (Signed)
Patient is alert and oriented with weakness and leg swelling SCD on however patient does not want on the left leg explained to benefit of improving circulation. I removed for comfort, also asked for a sleep aid an PRN was given.

## 2017-09-20 NOTE — Progress Notes (Addendum)
PHARMACY - PHYSICIAN COMMUNICATION CRITICAL VALUE ALERT - BLOOD CULTURE IDENTIFICATION (BCID)  Anita Mccormick is an 72 y.o. female who presented to [facility] on 09/19/2017 with a chief complaint of sepsis.   Assessment:  E.coli bacteremia and likely UTI as well  Name of physician (or Provider) Contacted: Dr. Ree Kida  Current antibiotics: Aztreonam 1 gm IV Q 8  Changes to prescribed antibiotics recommended:  Patient is on recommended antibiotics - No changes needed  Results for orders placed or performed during the hospital encounter of 09/19/17  Blood Culture ID Panel (Reflexed) (Collected: 09/19/2017  5:20 PM)  Result Value Ref Range   Enterococcus species NOT DETECTED NOT DETECTED   Listeria monocytogenes NOT DETECTED NOT DETECTED   Staphylococcus species NOT DETECTED NOT DETECTED   Staphylococcus aureus NOT DETECTED NOT DETECTED   Streptococcus species NOT DETECTED NOT DETECTED   Streptococcus agalactiae NOT DETECTED NOT DETECTED   Streptococcus pneumoniae NOT DETECTED NOT DETECTED   Streptococcus pyogenes NOT DETECTED NOT DETECTED   Acinetobacter baumannii NOT DETECTED NOT DETECTED   Enterobacteriaceae species DETECTED (A) NOT DETECTED   Enterobacter cloacae complex NOT DETECTED NOT DETECTED   Escherichia coli DETECTED (A) NOT DETECTED   Klebsiella oxytoca NOT DETECTED NOT DETECTED   Klebsiella pneumoniae NOT DETECTED NOT DETECTED   Proteus species NOT DETECTED NOT DETECTED   Serratia marcescens NOT DETECTED NOT DETECTED   Carbapenem resistance NOT DETECTED NOT DETECTED   Haemophilus influenzae NOT DETECTED NOT DETECTED   Neisseria meningitidis NOT DETECTED NOT DETECTED   Pseudomonas aeruginosa NOT DETECTED NOT DETECTED   Candida albicans NOT DETECTED NOT DETECTED   Candida glabrata NOT DETECTED NOT DETECTED   Candida krusei NOT DETECTED NOT DETECTED   Candida parapsilosis NOT DETECTED NOT DETECTED   Candida tropicalis NOT DETECTED NOT DETECTED    Albertina Parr,  PharmD., BCPS Clinical Pharmacist Pager [phone]  Addendum: Micro lab called with updated results. Now 2/2 BCx with E.coli. Abx were changed to cefepime for better gram negative coverage.   Albertina Parr, PharmD., BCPS Clinical Pharmacist Pager [phone]

## 2017-09-21 DIAGNOSIS — D649 Anemia, unspecified: Secondary | ICD-10-CM

## 2017-09-21 DIAGNOSIS — A4151 Sepsis due to Escherichia coli [E. coli]: Secondary | ICD-10-CM

## 2017-09-21 DIAGNOSIS — N179 Acute kidney failure, unspecified: Secondary | ICD-10-CM

## 2017-09-21 LAB — GLUCOSE, CAPILLARY
Glucose-Capillary: 209 mg/dL — ABNORMAL HIGH (ref 65–99)
Glucose-Capillary: 207 mg/dL — ABNORMAL HIGH (ref 65–99)
Glucose-Capillary: 218 mg/dL — ABNORMAL HIGH (ref 65–99)
Glucose-Capillary: 158 mg/dL — ABNORMAL HIGH (ref 65–99)

## 2017-09-21 LAB — BPAM RBC
BLOOD PRODUCT EXPIRATION DATE: 201903202359
BLOOD PRODUCT EXPIRATION DATE: 201903222359
ISSUE DATE / TIME: 201902241119
ISSUE DATE / TIME: 201902241528
UNIT TYPE AND RH: 7300
UNIT TYPE AND RH: 7300

## 2017-09-21 LAB — TYPE AND SCREEN
ABO/RH(D): B POS
ANTIBODY SCREEN: NEGATIVE
UNIT DIVISION: 0
UNIT DIVISION: 0

## 2017-09-21 LAB — BASIC METABOLIC PANEL
Anion gap: 9 (ref 5–15)
BUN: 20 mg/dL (ref 6–20)
CHLORIDE: 107 mmol/L (ref 101–111)
CO2: 21 mmol/L — AB (ref 22–32)
CREATININE: 1.25 mg/dL — AB (ref 0.44–1.00)
Calcium: 8.5 mg/dL — ABNORMAL LOW (ref 8.9–10.3)
GFR calc Af Amer: 49 mL/min — ABNORMAL LOW (ref >60)
GFR calc non Af Amer: 43 mL/min — ABNORMAL LOW (ref >60)
Glucose, Bld: 212 mg/dL — ABNORMAL HIGH (ref 65–99)
POTASSIUM: 4.3 mmol/L (ref 3.5–5.1)
Sodium: 137 mmol/L (ref 135–145)

## 2017-09-21 LAB — URINE CULTURE

## 2017-09-21 LAB — CBC
HEMATOCRIT: 30.8 % — AB (ref 36.0–46.0)
HEMOGLOBIN: 9.7 g/dL — AB (ref 12.0–15.0)
MCH: 28.8 pg (ref 26.0–34.0)
MCHC: 31.5 g/dL (ref 30.0–36.0)
MCV: 91.4 fL (ref 78.0–100.0)
Platelets: 111 10*3/uL — ABNORMAL LOW (ref 150–400)
RBC: 3.37 MIL/uL — ABNORMAL LOW (ref 3.87–5.11)
RDW: 16.7 % — ABNORMAL HIGH (ref 11.5–15.5)
WBC: 14.7 10*3/uL — ABNORMAL HIGH (ref 4.0–10.5)

## 2017-09-21 LAB — LACTIC ACID, PLASMA: Lactic Acid, Venous: 1.9 mmol/L (ref 0.5–1.9)

## 2017-09-21 LAB — PATHOLOGIST SMEAR REVIEW

## 2017-09-21 MED ORDER — INSULIN ASPART 100 UNIT/ML ~~LOC~~ SOLN: 0.0000 [IU] | Freq: Every day | SUBCUTANEOUS | Status: DC

## 2017-09-21 MED ORDER — INSULIN ASPART 100 UNIT/ML ~~LOC~~ SOLN
0.0000 [IU] | Freq: Three times a day (TID) | SUBCUTANEOUS | Status: DC
  Administered 2017-09-21 (×2): 5 [IU] via SUBCUTANEOUS
  Administered 2017-09-22 (×3): 3 [IU] via SUBCUTANEOUS
  Administered 2017-09-23: 5 [IU] via SUBCUTANEOUS
  Administered 2017-09-23: 3 [IU] via SUBCUTANEOUS

## 2017-09-21 NOTE — Plan of Care (Signed)
  Clinical Measurements: Ability to maintain clinical measurements within normal limits will improve 09/21/2017 2351 - Progressing by Rafeef Lau, Roma Kayser, RN   Clinical Measurements: Will remain free from infection 09/21/2017 2351 - Progressing by Cem Kosman A, RN   Nutrition: Adequate nutrition will be maintained 09/21/2017 2351 - Progressing by Dail Meece, Roma Kayser, RN

## 2017-09-21 NOTE — Progress Notes (Signed)
Inpatient Diabetes Program Recommendations  AACE/ADA: New Consensus Statement on Inpatient Glycemic Control (2015)  Target Ranges:  Prepandial:   less than 140 mg/dL      Peak postprandial:   less than 180 mg/dL (1-2 hours)      Critically ill patients:  140 - 180 mg/dL   Lab Results  Component Value Date   GLUCAP 209 (H) 09/21/2017   HGBA1C 6.8 (H) 03/18/2017    Review of Glycemic Control   Diabetes history: DM2 Outpatient Diabetes medications: glipizide 10 mg bid, januvia 100 mg QD Current orders for Inpatient glycemic control: Novolog 0-15 units tidwc and hs  Inpatient Diabetes Program Recommendations:     Please order HgbA1C to assess glycemic control - last one 03/18/2017  Add low dose basal - FBS - 212 mg/dL May need meal coverage insulin if post-prandials > 180 mg/dL.  Will follow closely.  Thank you. Lorenda Peck, RD, LDN, CDE Inpatient Diabetes Coordinator [phone]

## 2017-09-21 NOTE — Progress Notes (Signed)
PROGRESS NOTE    Anita Mccormick  UYQ:[id number] DOB: [birth year] DOA: 09/19/2017 PCP: Billie Ruddy, MD   Brief Narrative:  HPI On 09/19/2017 by Dr. Ivor Costa Anita Mccormick is a 72 y.o. female with medical history significant of Hypertension, hyperlipidemia, diabetes mellitus, stroke, breast cancer in remission, tobacco abuse, CKD-2, dCHF, who presents with fever, chills, dysuria and urinary frequency.  Pt state that she has been feeling bad in the past several days. She has fever and chill. She reports dysuria, urgency and frequency. No flank pain. She also reports a headache. Denies neck rigidity. No unilateral weakness. Patient has nausea and vomited twice, but no diarrhea or abdominal pain. Patient does not have chest pain, shortness breath, cough, runny nose or sore throat. Patient states that her blood pressure has also been elevated recently. She has bilateral leg edema.  Interim history Patient admitted for urinary tract infection, found to have sepsis and E. coli bacteremia.  Currently placed on cefepime.  Also had a hemoglobin of 6.1 on admission, was given 2 units PRBC.  Assessment & Plan   Sepsis secondary to urinary tract infection/Ecoli bacteremia -Patient presented with fever, tachycardic, tachypnea, leukopenia with elevated lactic acid level -Currently patient's vital signs are improving however leukocytosis now worsening to 17.1 -UA showed few bacteria, TNTC WBC, positive nitrites, large leukocytes -Urine culture pending, was added on after patient received antibiotics -Blood culture showed gram-negative rods, BCID positive for enterobacteriaceae and E. Coli -Patient initially placed on Bactrim due to penicillin allergy -Continue cefepime -Will discontinue IV fluid given the patient does have history of lower extremity edema and Lasix is currently being held -Leukocytosis and vital signs appear to be improving.  Lactic acid currently 1.9 (also improved) -Continue to  monitor CBC  Normocytic Anemia/Pancytopenia -Hemoglobin currently 6.1 baseline hemoglobin 12.8 back in 2018, was 8.1 in January 2019 -Transfused 2 units PRBC, hemoglobin today 9.7 -Anemia panel: Iron 19, Ferritin 14, saturation ratio5 -We will place patient on iron supplementation -FOBT ordered -as above, patient was leukopenic on admission, now with leukocytosis- suspect secondary to sepsis  -platelets currently 106, baseline 130s -Continue to monitor CBC -Lovenox discontinued until FOBT is collected.  Continue SCDs  Acute kidney injury  -Upon review of patient's chart, Baseline creatinine approximately 0.8-0.9 -Creatinine was elevated at 1.49, currently down to 1.25 -Suspect secondary to sepsis as well as anemia -Continue IV fluids as well as blood transfusion -Monitor BMP  Diabetes mellitus, type II -Januvia, glipizide held -Continue insulin sliding scale and CBG monitoring, will increase sliding scale to moderate as blood sugars appear to be uncontrolled  Essential hypertension -Lasix/Cozaar held due to worsening renal function -Continue atenolol, IV hydralazine as needed  Lower extremity edema/grade 1 diastolic dysfunction -Patient has been following with cardiology for this chronic lower extremity edema and leg pain.  She has been on Lasix as an outpatient. -Echocardiogram 08/11/2017 showed an EF of 63-87%, grade 1 diastolic dysfunction with mild LVH -Cardiology not referring to this as CHF -Lasix currently held due to sepsis and AKI  History of CVA  -Currently no neurological deficits, continue aspirin  Tobacco abuse -Discussed smoking cessation -Continue nicotine patch  DVT Prophylaxis  SCDs  Code Status: Full  Family Communication: None at bedside  Disposition Plan: Admitted.  Continue current treatment, pending sensitivities.  Expect possible discharge to home within 1-2 days  Consultants None  Procedures  None  Antibiotics   Anti-infectives (From  admission, onward)   Start     Dose/Rate  Route Frequency Ordered Stop   09/21/17 0800  levofloxacin (LEVAQUIN) IVPB 750 mg  Status:  Discontinued     750 mg 100 mL/hr over 90 Minutes Intravenous Every 48 hours 09/19/17 1941 09/19/17 2035   09/20/17 1800  ceFEPIme (MAXIPIME) 2 g in sodium chloride 0.9 % 100 mL IVPB     2 g 200 mL/hr over 30 Minutes Intravenous Every 24 hours 09/20/17 1520     09/20/17 1200  ceFEPIme (MAXIPIME) 2 g in sodium chloride 0.9 % 100 mL IVPB  Status:  Discontinued     2 g 200 mL/hr over 30 Minutes Intravenous Every 24 hours 09/20/17 1056 09/20/17 1520   09/20/17 0800  vancomycin (VANCOCIN) IVPB 750 mg/150 ml premix  Status:  Discontinued     750 mg 150 mL/hr over 60 Minutes Intravenous Every 12 hours 09/19/17 1941 09/19/17 2035   09/20/17 0200  aztreonam (AZACTAM) 1 g in sodium chloride 0.9 % 100 mL IVPB  Status:  Discontinued     1 g 200 mL/hr over 30 Minutes Intravenous Every 8 hours 09/19/17 1941 09/20/17 1051   09/19/17 1800  vancomycin (VANCOCIN) 1,500 mg in sodium chloride 0.9 % 500 mL IVPB     1,500 mg 250 mL/hr over 120 Minutes Intravenous  Once 09/19/17 1758 09/19/17 2024   09/19/17 1745  levofloxacin (LEVAQUIN) IVPB 750 mg     750 mg 100 mL/hr over 90 Minutes Intravenous  Once 09/19/17 1739 09/19/17 1916   09/19/17 1745  aztreonam (AZACTAM) 2 g in sodium chloride 0.9 % 100 mL IVPB     2 g 200 mL/hr over 30 Minutes Intravenous  Once 09/19/17 1739 09/19/17 2002   09/19/17 1745  vancomycin (VANCOCIN) IVPB 1000 mg/200 mL premix  Status:  Discontinued     1,000 mg 200 mL/hr over 60 Minutes Intravenous  Once 09/19/17 1739 09/19/17 1758      Subjective:   Anita Mccormick seen and examined today.  Complains of feeling weak.  Denies current shortness of breath, chest pain, abdominal pain, nausea or vomiting, diarrhea constipation, headache or dizziness.  Afraid to eat due to her blood sugars.  Objective:   Vitals:   09/20/17 2010 09/21/17 0429 09/21/17  0543 09/21/17 0818  BP: 133/63 (!) 150/56 124/68 (!) 148/64  Pulse: 83 85 96 80  Resp: 20 18 18    Temp: 98.2 F (36.8 C) 100.2 F (37.9 C) (!) 97.5 F (36.4 C)   TempSrc: Oral Oral Oral   SpO2: 97% 96% 94% 98%  Weight:  79.2 kg (174 lb 8 oz)    Height:        Intake/Output Summary (Last 24 hours) at 09/21/2017 1034 Last data filed at 09/21/2017 0957 Gross per 24 hour  Intake 2915 ml  Output 1100 ml  Net 1815 ml   Exam  General: Well developed, well nourished, NAD, appears stated age  HEENT: NCAT, mucous membranes moist.   Neck: Supple  Cardiovascular: S1 S2 auscultated, 1/6SEM, RRR  Respiratory: Clear to auscultation bilaterally with equal chest rise, no wheezing or rhonchi  Abdomen: Soft, nontender, nondistended, + bowel sounds  Extremities: warm dry without cyanosis clubbing. +LE edema B/L  Neuro: AAOx3, nonfocal  Skin: Without rashes exudates or nodules  Psych: appropriate mood and affect, pleasant   Data Reviewed: I have personally reviewed following labs and imaging studies  CBC: Recent Labs  Lab 09/19/17 1650 09/20/17 0355 09/20/17 2113 09/21/17 0629  WBC 2.0* 17.1* 16.0* 14.7*  NEUTROABS 1.6*  --  12.9*  --  HGB 7.6* 6.1* 10.4* 9.7*  HCT 25.4* 21.3* 33.1* 30.8*  MCV 95.5 95.5 91.2 91.4  PLT 113* 106* 118* 409*   Basic Metabolic Panel: Recent Labs  Lab 09/19/17 1650 09/20/17 0355 09/20/17 2113 09/21/17 0629  NA 136 136 137 137  K 4.5 4.4 3.8 4.3  CL 102 105 105 107  CO2 20* 19* 21* 21*  GLUCOSE 301* 345* 237* 212*  BUN 11 17 20 20   CREATININE 1.12* 1.49* 1.41* 1.25*  CALCIUM 9.6 8.6* 8.8* 8.5*   GFR: Estimated Creatinine Clearance: 40.8 mL/min (A) (by C-G formula based on SCr of 1.25 mg/dL (H)). Liver Function Tests: Recent Labs  Lab 09/19/17 1650  AST 68*  ALT 25  ALKPHOS 194*  BILITOT 1.1  PROT 7.2  ALBUMIN 2.7*   No results for input(s): LIPASE, AMYLASE in the last 168 hours. No results for input(s): AMMONIA in the last  168 hours. Coagulation Profile: No results for input(s): INR, PROTIME in the last 168 hours. Cardiac Enzymes: No results for input(s): CKTOTAL, CKMB, CKMBINDEX, TROPONINI in the last 168 hours. BNP (last 3 results) Recent Labs    08/04/17 1622 08/11/17 1604  PROBNP 257.0* 137.0*   HbA1C: No results for input(s): HGBA1C in the last 72 hours. CBG: Recent Labs  Lab 09/20/17 0759 09/20/17 1209 09/20/17 1644 09/20/17 2118 09/21/17 0736  GLUCAP 290* 237* 342* 214* 209*   Lipid Profile: No results for input(s): CHOL, HDL, LDLCALC, TRIG, CHOLHDL, LDLDIRECT in the last 72 hours. Thyroid Function Tests: No results for input(s): TSH, T4TOTAL, FREET4, T3FREE, THYROIDAB in the last 72 hours. Anemia Panel: Recent Labs    09/20/17 0023  VITAMINB12 497  FOLATE 4.3*  FERRITIN 14  TIBC 351  IRON 19*  RETICCTPCT 3.8*   Urine analysis:    Component Value Date/Time   COLORURINE YELLOW 09/19/2017 1753   APPEARANCEUR HAZY (A) 09/19/2017 1753   LABSPEC 1.006 09/19/2017 1753   PHURINE 5.0 09/19/2017 1753   GLUCOSEU >=500 (A) 09/19/2017 1753   GLUCOSEU >=1000 (A) 08/04/2017 1622   HGBUR SMALL (A) 09/19/2017 1753   BILIRUBINUR NEGATIVE 09/19/2017 1753   BILIRUBINUR n 01/09/2011 1012   KETONESUR NEGATIVE 09/19/2017 1753   PROTEINUR NEGATIVE 09/19/2017 1753   UROBILINOGEN 0.2 08/04/2017 1622   NITRITE POSITIVE (A) 09/19/2017 1753   LEUKOCYTESUR LARGE (A) 09/19/2017 1753   Sepsis Labs: @LABRCNTIP (procalcitonin:4,lacticidven:4)  ) Recent Results (from the past 240 hour(s))  Blood Culture (routine x 2)     Status: Abnormal (Preliminary result)   Collection Time: 09/19/17  5:20 PM  Result Value Ref Range Status   Specimen Description BLOOD BLOOD LEFT FOREARM  Final   Special Requests   Final    BOTTLES DRAWN AEROBIC AND ANAEROBIC Blood Culture adequate volume   Culture  Setup Time   Final    GRAM NEGATIVE RODS ANAEROBIC BOTTLE ONLY CRITICAL RESULT CALLED TO, READ BACK BY AND  VERIFIED WITH: B MANCHERIL,PHARMD AT 1004 09/20/17 BY L BENFIELD    Culture (A)  Final    ESCHERICHIA COLI SUSCEPTIBILITIES TO FOLLOW Performed at [facility], [place] [address] [place], [place] [address]    Report Status PENDING  Incomplete  Blood Culture ID Panel (Reflexed)     Status: Abnormal   Collection Time: 09/19/17  5:20 PM  Result Value Ref Range Status   Enterococcus species NOT DETECTED NOT DETECTED Final   Listeria monocytogenes NOT DETECTED NOT DETECTED Final   Staphylococcus species NOT DETECTED NOT DETECTED Final  Staphylococcus aureus NOT DETECTED NOT DETECTED Final   Streptococcus species NOT DETECTED NOT DETECTED Final   Streptococcus agalactiae NOT DETECTED NOT DETECTED Final   Streptococcus pneumoniae NOT DETECTED NOT DETECTED Final   Streptococcus pyogenes NOT DETECTED NOT DETECTED Final   Acinetobacter baumannii NOT DETECTED NOT DETECTED Final   Enterobacteriaceae species DETECTED (A) NOT DETECTED Final    Comment: Enterobacteriaceae represent a large family of gram-negative bacteria, not a single organism. CRITICAL RESULT CALLED TO, READ BACK BY AND VERIFIED WITH: B MANCHERIL,PHARMD AT 1004 09/20/17 BY L BENFIELD    Enterobacter cloacae complex NOT DETECTED NOT DETECTED Final   Escherichia coli DETECTED (A) NOT DETECTED Final    Comment: CRITICAL RESULT CALLED TO, READ BACK BY AND VERIFIED WITH: B MANCHERIL,PHARMD AT 1004 09/20/17 BY L BENFIELD    Klebsiella oxytoca NOT DETECTED NOT DETECTED Final   Klebsiella pneumoniae NOT DETECTED NOT DETECTED Final   Proteus species NOT DETECTED NOT DETECTED Final   Serratia marcescens NOT DETECTED NOT DETECTED Final   Carbapenem resistance NOT DETECTED NOT DETECTED Final   Haemophilus influenzae NOT DETECTED NOT DETECTED Final   Neisseria meningitidis NOT DETECTED NOT DETECTED Final   Pseudomonas aeruginosa NOT DETECTED NOT DETECTED Final   Candida albicans NOT DETECTED NOT DETECTED Final   Candida glabrata NOT  DETECTED NOT DETECTED Final   Candida krusei NOT DETECTED NOT DETECTED Final   Candida parapsilosis NOT DETECTED NOT DETECTED Final   Candida tropicalis NOT DETECTED NOT DETECTED Final    Comment: Performed at [facility], [place]. [address], [place], [place] [address]  Blood Culture (routine x 2)     Status: None (Preliminary result)   Collection Time: 09/19/17  6:07 PM  Result Value Ref Range Status   Specimen Description BLOOD BLOOD LEFT ARM  Final   Special Requests   Final    BOTTLES DRAWN AEROBIC AND ANAEROBIC Blood Culture adequate volume   Culture  Setup Time   Final    GRAM NEGATIVE RODS IN BOTH AEROBIC AND ANAEROBIC BOTTLES CRITICAL RESULT CALLED TO, READ BACK BY AND VERIFIED WITH: B MANCHERIL,PHARMD AT 1110 09/20/17 BY L BENFIELD Performed at [facility], [place] [address] [place], [place] [address]    Culture GRAM NEGATIVE RODS  Final   Report Status PENDING  Incomplete  Urine Culture     Status: None (Preliminary result)   Collection Time: 09/20/17  8:09 AM  Result Value Ref Range Status   Specimen Description URINE, CLEAN CATCH  Final   Special Requests NONE  Final   Culture   Final    CULTURE REINCUBATED FOR BETTER GROWTH Performed at [facility], [place] [address], [place],  [address]    Report Status PENDING  Incomplete      Radiology Studies: Dg Chest 2 View  Result Date: 09/19/2017 CLINICAL DATA:  Fever, hypertension and headache. EXAM: CHEST  2 VIEW COMPARISON:  None. FINDINGS: The cardiomediastinal silhouette is unremarkable. There is no evidence of focal airspace disease, pulmonary edema, suspicious pulmonary nodule/mass, pleural effusion, or pneumothorax. No acute bony abnormalities are identified. IMPRESSION: No active cardiopulmonary disease. Electronically Signed   By: Margarette Canada M.D.   On: 09/19/2017 17:58     Scheduled Meds: . aspirin  325 mg Oral Daily  . atenolol  100 mg Oral BID  . calcium-vitamin D  1 tablet Oral BID  .  insulin aspart  0-5 Units Subcutaneous QHS  . insulin aspart  0-9 Units Subcutaneous  TID WC  . nicotine  21 mg Transdermal Daily   Continuous Infusions: . sodium chloride 75 mL/hr at 09/20/17 2359  . ceFEPime (MAXIPIME) IV Stopped (09/20/17 2140)     LOS: 2 days   Time Spent in minutes   35 minutes  Melicia Esqueda D.O. on 09/21/2017 at 10:34 AM  Between 7am to 7pm - Pager - [phone]  After 7pm go to [email or web address] - password [email or web address]  And look for the night coverage person covering for me after hours  [facility] Office  [phone]

## 2017-09-22 LAB — BASIC METABOLIC PANEL
Anion gap: 8 (ref 5–15)
BUN: 18 mg/dL (ref 6–20)
CALCIUM: 8.6 mg/dL — AB (ref 8.9–10.3)
CO2: 20 mmol/L — ABNORMAL LOW (ref 22–32)
CREATININE: 1.13 mg/dL — AB (ref 0.44–1.00)
Chloride: 110 mmol/L (ref 101–111)
GFR, EST AFRICAN AMERICAN: 56 mL/min — AB (ref >60)
GFR, EST NON AFRICAN AMERICAN: 48 mL/min — AB (ref >60)
Glucose, Bld: 182 mg/dL — ABNORMAL HIGH (ref 65–99)
Potassium: 4.2 mmol/L (ref 3.5–5.1)
SODIUM: 138 mmol/L (ref 135–145)

## 2017-09-22 LAB — CBC
HCT: 32.2 % — ABNORMAL LOW (ref 36.0–46.0)
Hemoglobin: 10 g/dL — ABNORMAL LOW (ref 12.0–15.0)
MCH: 28.4 pg (ref 26.0–34.0)
MCHC: 31.1 g/dL (ref 30.0–36.0)
MCV: 91.5 fL (ref 78.0–100.0)
PLATELETS: 121 10*3/uL — AB (ref 150–400)
RBC: 3.52 MIL/uL — ABNORMAL LOW (ref 3.87–5.11)
RDW: 16.7 % — AB (ref 11.5–15.5)
WBC: 11.4 10*3/uL — ABNORMAL HIGH (ref 4.0–10.5)

## 2017-09-22 LAB — GLUCOSE, CAPILLARY
GLUCOSE-CAPILLARY: 167 mg/dL — AB (ref 65–99)
Glucose-Capillary: 185 mg/dL — ABNORMAL HIGH (ref 65–99)
Glucose-Capillary: 186 mg/dL — ABNORMAL HIGH (ref 65–99)
Glucose-Capillary: 198 mg/dL — ABNORMAL HIGH (ref 65–99)

## 2017-09-22 LAB — CULTURE, BLOOD (ROUTINE X 2)
Special Requests: ADEQUATE
Special Requests: ADEQUATE

## 2017-09-22 LAB — OCCULT BLOOD X 1 CARD TO LAB, STOOL: FECAL OCCULT BLD: POSITIVE — AB

## 2017-09-22 NOTE — Progress Notes (Signed)
PROGRESS NOTE    Anita Mccormick  IZT:[id number] DOB: 09/05/1945 DOA: 09/19/2017 PCP: Billie Ruddy, MD   Brief Narrative:  HPI On 09/19/2017 by Dr. Ivor Costa Anita Mccormick is a 72 y.o. female with medical history significant of Hypertension, hyperlipidemia, diabetes mellitus, stroke, breast cancer in remission, tobacco abuse, CKD-2, dCHF, who presents with fever, chills, dysuria and urinary frequency.  Pt state that she has been feeling bad in the past several days. She has fever and chill. She reports dysuria, urgency and frequency. No flank pain. She also reports a headache. Denies neck rigidity. No unilateral weakness. Patient has nausea and vomited twice, but no diarrhea or abdominal pain. Patient does not have chest pain, shortness breath, cough, runny nose or sore throat. Patient states that her blood pressure has also been elevated recently. She has bilateral leg edema.  Interim history Patient admitted for urinary tract infection, found to have sepsis and E. coli bacteremia.  Currently placed on cefepime.  Also had a hemoglobin of 6.1 on admission, was given 2 units PRBC.  Assessment & Plan   Sepsis secondary to urinary tract infection/Ecoli bacteremia -Patient presented with fever, tachycardic, tachypnea, leukopenia with elevated lactic acid level -Currently patient's vital signs are improving however leukocytosis now worsening to 17.1 -UA showed few bacteria, TNTC WBC, positive nitrites, large leukocytes -Urine culture <10K colonies (was added on after patient received antibiotics) -Blood culture showed gram-negative rods, BCID positive for enterobacteriaceae and E. Coli- pending sensitivities -Patient initially placed on Bactrim due to penicillin allergy -Continue cefepime -Discontinued IV fluid given the patient does have history of lower extremity edema and Lasix is currently being held -Leukocytosis and vital signs appear to be improving.  Lactic acid also  improved -Continue to monitor CBC  Normocytic Anemia/Pancytopenia -Hemoglobin currently 6.1 baseline hemoglobin 12.8 back in 2018, was 8.1 in January 2019 -Transfused 2 units PRBC, hemoglobin today 9.7 -Anemia panel: Iron 19, Ferritin 14, saturation ratio5 -We will place patient on iron supplementation -FOBT positive, discussed with patient, she does follow with Dr. Earlean Shawl and has a colonscopy in 2012 which showed hemorrhoids and polyps. Will call and discuss with Dr. Earlean Shawl.  -as above, patient was leukopenic on admission, now with leukocytosis- suspect secondary to sepsis  -platelets currently 121, baseline 130s -Continue to monitor CBC -Lovenox discontinued until FOBT is collected.  Continue SCDs  Acute kidney injury  -Upon review of patient's chart, Baseline creatinine approximately 0.8-0.9 -Creatinine was elevated at 1.49, currently down to 1.13 -Suspect secondary to sepsis as well as anemia -discontinue IVF -Monitor BMP  Diabetes mellitus, type II -Januvia, glipizide held -Continue insulin sliding scale and CBG monitoring, will increase sliding scale to moderate as blood sugars appear to be uncontrolled  Essential hypertension -Lasix/Cozaar held due to worsening renal function -Continue atenolol, IV hydralazine as needed  Lower extremity edema/grade 1 diastolic dysfunction -Patient has been following with cardiology for this chronic lower extremity edema and leg pain.  She has been on Lasix as an outpatient. -Echocardiogram 08/11/2017 showed an EF of 38-25%, grade 1 diastolic dysfunction with mild LVH -Cardiology not referring to this as CHF -Lasix currently held due to sepsis and AKI  History of CVA  -Currently no neurological deficits, continue aspirin  Tobacco abuse -Discussed smoking cessation -Continue nicotine patch  Physical deconditioning -will consult PT  DVT Prophylaxis  SCDs  Code Status: Full  Family Communication: daugther at bedside  Disposition  Plan: Admitted.  Continue current treatment, pending sensitivities.  Expect possible discharge  to home within 1-2 days  Consultants None  Procedures  None  Antibiotics   Anti-infectives (From admission, onward)   Start     Dose/Rate Route Frequency Ordered Stop   09/21/17 0800  levofloxacin (LEVAQUIN) IVPB 750 mg  Status:  Discontinued     750 mg 100 mL/hr over 90 Minutes Intravenous Every 48 hours 09/19/17 1941 09/19/17 2035   09/20/17 1800  ceFEPIme (MAXIPIME) 2 g in sodium chloride 0.9 % 100 mL IVPB     2 g 200 mL/hr over 30 Minutes Intravenous Every 24 hours 09/20/17 1520     09/20/17 1200  ceFEPIme (MAXIPIME) 2 g in sodium chloride 0.9 % 100 mL IVPB  Status:  Discontinued     2 g 200 mL/hr over 30 Minutes Intravenous Every 24 hours 09/20/17 1056 09/20/17 1520   09/20/17 0800  vancomycin (VANCOCIN) IVPB 750 mg/150 ml premix  Status:  Discontinued     750 mg 150 mL/hr over 60 Minutes Intravenous Every 12 hours 09/19/17 1941 09/19/17 2035   09/20/17 0200  aztreonam (AZACTAM) 1 g in sodium chloride 0.9 % 100 mL IVPB  Status:  Discontinued     1 g 200 mL/hr over 30 Minutes Intravenous Every 8 hours 09/19/17 1941 09/20/17 1051   09/19/17 1800  vancomycin (VANCOCIN) 1,500 mg in sodium chloride 0.9 % 500 mL IVPB     1,500 mg 250 mL/hr over 120 Minutes Intravenous  Once 09/19/17 1758 09/19/17 2024   09/19/17 1745  levofloxacin (LEVAQUIN) IVPB 750 mg     750 mg 100 mL/hr over 90 Minutes Intravenous  Once 09/19/17 1739 09/19/17 1916   09/19/17 1745  aztreonam (AZACTAM) 2 g in sodium chloride 0.9 % 100 mL IVPB     2 g 200 mL/hr over 30 Minutes Intravenous  Once 09/19/17 1739 09/19/17 2002   09/19/17 1745  vancomycin (VANCOCIN) IVPB 1000 mg/200 mL premix  Status:  Discontinued     1,000 mg 200 mL/hr over 60 Minutes Intravenous  Once 09/19/17 1739 09/19/17 1758      Subjective:   Anita Mccormick seen and examined today.  Complains of lower back pain which has been ongoing for quite  some time.  Denies any current chest pain, shortness of breath, abdominal pain, nausea or vomiting, diarrhea or constipation.  Objective:   Vitals:   09/21/17 1206 09/21/17 2100 09/22/17 0516 09/22/17 1037  BP: (!) 160/67 (!) 145/57 (!) 156/72 (!) 148/66  Pulse: 77 74 79 75  Resp: 18 20 18 16   Temp: 98 F (36.7 C) 98.7 F (37.1 C) 99.7 F (37.6 C)   TempSrc: Oral Oral Oral   SpO2: 98% 100% 100% 100%  Weight:   77.7 kg (171 lb 3.2 oz)   Height:        Intake/Output Summary (Last 24 hours) at 09/22/2017 1110 Last data filed at 09/22/2017 9485 Gross per 24 hour  Intake 600 ml  Output 1940 ml  Net -1340 ml   Exam  General: Well developed, well nourished, NAD, appears stated age  HEENT: NCAT, mucous membranes moist.   Neck: Supple  Cardiovascular: S1 S2 auscultated, 1/6 SEM,  Respiratory: Clear to auscultation bilaterally with equal chest rise  Abdomen: Soft, nontender, nondistended, + bowel sounds  Extremities: warm dry without cyanosis clubbing or edema  Neuro: AAOx3, nonfocal  Psych: appropriate mood and affect, pleasant   Data Reviewed: I have personally reviewed following labs and imaging studies  CBC: Recent Labs  Lab 09/19/17 1650 09/20/17 0355 09/20/17  2113 09/21/17 0629 09/22/17 0527  WBC 2.0* 17.1* 16.0* 14.7* 11.4*  NEUTROABS 1.6*  --  12.9*  --   --   HGB 7.6* 6.1* 10.4* 9.7* 10.0*  HCT 25.4* 21.3* 33.1* 30.8* 32.2*  MCV 95.5 95.5 91.2 91.4 91.5  PLT 113* 106* 118* 111* 503*   Basic Metabolic Panel: Recent Labs  Lab 09/19/17 1650 09/20/17 0355 09/20/17 2113 09/21/17 0629 09/22/17 0527  NA 136 136 137 137 138  K 4.5 4.4 3.8 4.3 4.2  CL 102 105 105 107 110  CO2 20* 19* 21* 21* 20*  GLUCOSE 301* 345* 237* 212* 182*  BUN 11 17 20 20 18   CREATININE 1.12* 1.49* 1.41* 1.25* 1.13*  CALCIUM 9.6 8.6* 8.8* 8.5* 8.6*   GFR: Estimated Creatinine Clearance: 44.7 mL/min (A) (by C-G formula based on SCr of 1.13 mg/dL (H)). Liver Function  Tests: Recent Labs  Lab 09/19/17 1650  AST 68*  ALT 25  ALKPHOS 194*  BILITOT 1.1  PROT 7.2  ALBUMIN 2.7*   No results for input(s): LIPASE, AMYLASE in the last 168 hours. No results for input(s): AMMONIA in the last 168 hours. Coagulation Profile: No results for input(s): INR, PROTIME in the last 168 hours. Cardiac Enzymes: No results for input(s): CKTOTAL, CKMB, CKMBINDEX, TROPONINI in the last 168 hours. BNP (last 3 results) Recent Labs    08/04/17 1622 08/11/17 1604  PROBNP 257.0* 137.0*   HbA1C: No results for input(s): HGBA1C in the last 72 hours. CBG: Recent Labs  Lab 09/21/17 0736 09/21/17 1207 09/21/17 1619 09/21/17 2049 09/22/17 0745  GLUCAP 209* 207* 218* 158* 185*   Lipid Profile: No results for input(s): CHOL, HDL, LDLCALC, TRIG, CHOLHDL, LDLDIRECT in the last 72 hours. Thyroid Function Tests: No results for input(s): TSH, T4TOTAL, FREET4, T3FREE, THYROIDAB in the last 72 hours. Anemia Panel: Recent Labs    09/20/17 0023  VITAMINB12 497  FOLATE 4.3*  FERRITIN 14  TIBC 351  IRON 19*  RETICCTPCT 3.8*   Urine analysis:    Component Value Date/Time   COLORURINE YELLOW 09/19/2017 1753   APPEARANCEUR HAZY (A) 09/19/2017 1753   LABSPEC 1.006 09/19/2017 1753   PHURINE 5.0 09/19/2017 1753   GLUCOSEU >=500 (A) 09/19/2017 1753   GLUCOSEU >=1000 (A) 08/04/2017 1622   HGBUR SMALL (A) 09/19/2017 1753   BILIRUBINUR NEGATIVE 09/19/2017 1753   BILIRUBINUR n 01/09/2011 1012   KETONESUR NEGATIVE 09/19/2017 1753   PROTEINUR NEGATIVE 09/19/2017 1753   UROBILINOGEN 0.2 08/04/2017 1622   NITRITE POSITIVE (A) 09/19/2017 1753   LEUKOCYTESUR LARGE (A) 09/19/2017 1753   Sepsis Labs: @LABRCNTIP (procalcitonin:4,lacticidven:4)  ) Recent Results (from the past 240 hour(s))  Blood Culture (routine x 2)     Status: Abnormal   Collection Time: 09/19/17  5:20 PM  Result Value Ref Range Status   Specimen Description BLOOD BLOOD LEFT FOREARM  Final   Special  Requests   Final    BOTTLES DRAWN AEROBIC AND ANAEROBIC Blood Culture adequate volume   Culture  Setup Time   Final    GRAM NEGATIVE RODS ANAEROBIC BOTTLE ONLY CRITICAL RESULT CALLED TO, READ BACK BY AND VERIFIED WITH: B MANCHERIL,PHARMD AT 1004 09/20/17 BY L BENFIELD Performed at [facility], [place] [address] [place], [place] [address]    Culture ESCHERICHIA COLI (A)  Final   Report Status 09/22/2017 FINAL  Final   Organism ID, Bacteria ESCHERICHIA COLI  Final      Susceptibility   Escherichia coli - MIC*  AMPICILLIN >=32 RESISTANT Resistant     CEFAZOLIN <=4 SENSITIVE Sensitive     CEFEPIME <=1 SENSITIVE Sensitive     CEFTAZIDIME <=1 SENSITIVE Sensitive     CEFTRIAXONE <=1 SENSITIVE Sensitive     CIPROFLOXACIN <=0.25 SENSITIVE Sensitive     GENTAMICIN 2 SENSITIVE Sensitive     IMIPENEM 0.5 SENSITIVE Sensitive     TRIMETH/SULFA <=20 SENSITIVE Sensitive     AMPICILLIN/SULBACTAM 16 INTERMEDIATE Intermediate     PIP/TAZO <=4 SENSITIVE Sensitive     Extended ESBL NEGATIVE Sensitive     * ESCHERICHIA COLI  Blood Culture ID Panel (Reflexed)     Status: Abnormal   Collection Time: 09/19/17  5:20 PM  Result Value Ref Range Status   Enterococcus species NOT DETECTED NOT DETECTED Final   Listeria monocytogenes NOT DETECTED NOT DETECTED Final   Staphylococcus species NOT DETECTED NOT DETECTED Final   Staphylococcus aureus NOT DETECTED NOT DETECTED Final   Streptococcus species NOT DETECTED NOT DETECTED Final   Streptococcus agalactiae NOT DETECTED NOT DETECTED Final   Streptococcus pneumoniae NOT DETECTED NOT DETECTED Final   Streptococcus pyogenes NOT DETECTED NOT DETECTED Final   Acinetobacter baumannii NOT DETECTED NOT DETECTED Final   Enterobacteriaceae species DETECTED (A) NOT DETECTED Final    Comment: Enterobacteriaceae represent a large family of gram-negative bacteria, not a single organism. CRITICAL RESULT CALLED TO, READ BACK BY AND VERIFIED WITH: B MANCHERIL,PHARMD  AT 1004 09/20/17 BY L BENFIELD    Enterobacter cloacae complex NOT DETECTED NOT DETECTED Final   Escherichia coli DETECTED (A) NOT DETECTED Final    Comment: CRITICAL RESULT CALLED TO, READ BACK BY AND VERIFIED WITH: B MANCHERIL,PHARMD AT 1004 09/20/17 BY L BENFIELD    Klebsiella oxytoca NOT DETECTED NOT DETECTED Final   Klebsiella pneumoniae NOT DETECTED NOT DETECTED Final   Proteus species NOT DETECTED NOT DETECTED Final   Serratia marcescens NOT DETECTED NOT DETECTED Final   Carbapenem resistance NOT DETECTED NOT DETECTED Final   Haemophilus influenzae NOT DETECTED NOT DETECTED Final   Neisseria meningitidis NOT DETECTED NOT DETECTED Final   Pseudomonas aeruginosa NOT DETECTED NOT DETECTED Final   Candida albicans NOT DETECTED NOT DETECTED Final   Candida glabrata NOT DETECTED NOT DETECTED Final   Candida krusei NOT DETECTED NOT DETECTED Final   Candida parapsilosis NOT DETECTED NOT DETECTED Final   Candida tropicalis NOT DETECTED NOT DETECTED Final    Comment: Performed at [facility], [place]. [address], [place], [place] [address]  Blood Culture (routine x 2)     Status: Abnormal   Collection Time: 09/19/17  6:07 PM  Result Value Ref Range Status   Specimen Description BLOOD BLOOD LEFT ARM  Final   Special Requests   Final    BOTTLES DRAWN AEROBIC AND ANAEROBIC Blood Culture adequate volume   Culture  Setup Time   Final    GRAM NEGATIVE RODS IN BOTH AEROBIC AND ANAEROBIC BOTTLES CRITICAL RESULT CALLED TO, READ BACK BY AND VERIFIED WITH: B MANCHERIL,PHARMD AT 1110 09/20/17 BY L BENFIELD    Culture (A)  Final    ESCHERICHIA COLI SUSCEPTIBILITIES PERFORMED ON PREVIOUS CULTURE WITHIN THE LAST 5 DAYS. Performed at [facility], [place] [address], [place], [place] [address]    Report Status 09/22/2017 FINAL  Final  Urine Culture     Status: Abnormal   Collection Time: 09/20/17  8:09 AM  Result Value Ref Range Status   Specimen Description URINE, CLEAN CATCH  Final  Special Requests NONE  Final   Culture (A)  Final    <10,000 COLONIES/mL Performed at [facility], [place] [address] [place], [place] [address]    Report Status 09/21/2017 FINAL  Final      Radiology Studies: No results found.   Scheduled Meds: . aspirin  325 mg Oral Daily  . atenolol  100 mg Oral BID  . calcium-vitamin D  1 tablet Oral BID  . insulin aspart  0-15 Units Subcutaneous TID WC  . insulin aspart  0-5 Units Subcutaneous QHS  . nicotine  21 mg Transdermal Daily   Continuous Infusions: . ceFEPime (MAXIPIME) IV 2 g (09/21/17 1813)     LOS: 3 days   Time Spent in minutes   35 minutes  Ajai Terhaar D.O. on 09/22/2017 at 11:10 AM  Between 7am to 7pm - Pager - [phone]  After 7pm go to [email or web address] - password [email or web address]  And look for the night coverage person covering for me after hours  [facility] Office  [phone]

## 2017-09-22 NOTE — Evaluation (Signed)
Physical Therapy Evaluation Patient Details Name: Anita Mccormick MRN: [id number] DOB: Aug 28, 1945 Today's Date: 09/22/2017   History of Present Illness  72yo female presenting with chills, dysuria, increased urinary frequency, and B LE edema. Diagnosed with sepsis secondary to UTI. PMH CA, DM, HTN, hx mastectomy   Clinical Impression   Patient received in bed, very pleasant and motivated to work with skilled PT services, stating "I want to go home and get back to my regular routine!". She is able to perform functional bed mobility with Mod(I) and extended time/effort; attempted standing marches at bedside with no UE support, patient unsteady and required MinA to maintain balance today. Utilized RW for gait today, able to ambulate approximately 148f this afternoon with general S and cues for navigation as she tended to drift to the right today. She declines getting up to the chair and was left in bed with all needs met this afternoon. Moving forward she will likely benefit from skilled OP PT services to further address functional deficits, improve balance, and reduce fall risk.     Follow Up Recommendations Outpatient PT    Equipment Recommendations  Rolling walker with 5" wheels    Recommendations for Other Services       Precautions / Restrictions Precautions Precautions: Fall Restrictions Weight Bearing Restrictions: No      Mobility  Bed Mobility Overal bed mobility: Modified Independent             General bed mobility comments: increased time and effort   Transfers Overall transfer level: Needs assistance Equipment used: Rolling walker (2 wheeled) Transfers: Sit to/from Stand Sit to Stand: Min guard         General transfer comment: min guard for safety due to generalized unsteadiness; performed standing marches at bedside to evaluate balance with patient requiring MinA to maintain balance   Ambulation/Gait Ambulation/Gait assistance: Supervision Ambulation  Distance (Feet): 140 Feet Assistive device: Rolling walker (2 wheeled) Gait Pattern/deviations: Step-through pattern;Decreased step length - right;Decreased step length - left;Trunk flexed;Drifts right/left   Gait velocity interpretation: Below normal speed for age/gender General Gait Details: slow but steady with RW, requires cues for navigation due to tendency to drift to R   Stairs            Wheelchair Mobility    Modified Rankin (Stroke Patients Only)       Balance Overall balance assessment: Needs assistance Sitting-balance support: Bilateral upper extremity supported;Feet supported Sitting balance-Leahy Scale: Good Sitting balance - Comments: able to maintain midline sitting easily    Standing balance support: During functional activity Standing balance-Leahy Scale: Poor Standing balance comment: unsteadiness noted during dynamic activities with no UE support; slow but steady with UE support on RW                              Pertinent Vitals/Pain Pain Assessment: No/denies pain    Home Living Family/patient expects to be discharged to:: Private residence Living Arrangements: Spouse/significant other Available Help at Discharge: Family Type of Home: House Home Access: Stairs to enter Entrance Stairs-Rails: None Entrance Stairs-Number of Steps: 2 Home Layout: One level Home Equipment: None      Prior Function Level of Independence: Independent         Comments: patient reports she is very independent and active at baseline, helps to take care of her 3 grandsons      Hand Dominance        Extremity/Trunk  Assessment        Lower Extremity Assessment Lower Extremity Assessment: Generalized weakness    Cervical / Trunk Assessment Cervical / Trunk Assessment: Kyphotic  Communication   Communication: No difficulties  Cognition Arousal/Alertness: Awake/alert Behavior During Therapy: WFL for tasks assessed/performed Overall  Cognitive Status: Within Functional Limits for tasks assessed                                        General Comments      Exercises     Assessment/Plan    PT Assessment Patient needs continued PT services  PT Problem List Decreased strength;Decreased mobility;Decreased safety awareness;Decreased balance;Decreased coordination       PT Treatment Interventions DME instruction;Therapeutic activities;Gait training;Therapeutic exercise;Patient/family education;Stair training;Balance training;Functional mobility training;Neuromuscular re-education    PT Goals (Current goals can be found in the Care Plan section)  Acute Rehab PT Goals Patient Stated Goal: to go home, "get back to my regular routine"  PT Goal Formulation: With patient Time For Goal Achievement: 10/06/17 Potential to Achieve Goals: Good    Frequency Min 3X/week   Barriers to discharge        Co-evaluation               AM-PAC PT "6 Clicks" Daily Activity  Outcome Measure Difficulty turning over in bed (including adjusting bedclothes, sheets and blankets)?: None Difficulty moving from lying on back to sitting on the side of the bed? : None Difficulty sitting down on and standing up from a chair with arms (e.g., wheelchair, bedside commode, etc,.)?: None Help needed moving to and from a bed to chair (including a wheelchair)?: A Little Help needed walking in hospital room?: A Little Help needed climbing 3-5 steps with a railing? : A Little 6 Click Score: 21    End of Session Equipment Utilized During Treatment: Gait belt Activity Tolerance: Patient tolerated treatment well Patient left: in bed;with call bell/phone within reach   PT Visit Diagnosis: Unsteadiness on feet (R26.81);Muscle weakness (generalized) (M62.81);History of falling (Z91.81)    Time: 2300-9794 PT Time Calculation (min) (ACUTE ONLY): 19 min   Charges:   PT Evaluation $PT Eval Low Complexity: 1 Low     PT G  Codes:        Deniece Ree PT, DPT, CBIS  Supplemental Physical Therapist [place]   Pager [phone]

## 2017-09-22 NOTE — Plan of Care (Signed)
  Urinary Elimination: Signs and symptoms of infection will decrease 09/22/2017 2338 - Progressing by Tristan Schroeder, RN   Clinical Measurements: Ability to maintain clinical measurements within normal limits will improve 09/22/2017 2338 - Progressing by Tristan Schroeder, RN   Clinical Measurements: Will remain free from infection 09/22/2017 2338 - Progressing by Tristan Schroeder, RN   Clinical Measurements: Diagnostic test results will improve 09/22/2017 2338 - Progressing by Tristan Schroeder, RN   Clinical Measurements: Cardiovascular complication will be avoided 09/22/2017 2338 - Progressing by Tristan Schroeder, RN

## 2017-09-23 ENCOUNTER — Ambulatory Visit: Payer: Medicare Other | Admitting: Podiatry

## 2017-09-23 DIAGNOSIS — E1129 Type 2 diabetes mellitus with other diabetic kidney complication: Secondary | ICD-10-CM

## 2017-09-23 LAB — BASIC METABOLIC PANEL
ANION GAP: 6 (ref 5–15)
BUN: 16 mg/dL (ref 6–20)
CHLORIDE: 107 mmol/L (ref 101–111)
CO2: 22 mmol/L (ref 22–32)
Calcium: 8.6 mg/dL — ABNORMAL LOW (ref 8.9–10.3)
Creatinine, Ser: 0.98 mg/dL (ref 0.44–1.00)
GFR calc Af Amer: >60 mL/min (ref >60)
GFR calc non Af Amer: 57 mL/min — ABNORMAL LOW (ref >60)
GLUCOSE: 171 mg/dL — AB (ref 65–99)
POTASSIUM: 4 mmol/L (ref 3.5–5.1)
Sodium: 135 mmol/L (ref 135–145)

## 2017-09-23 LAB — GLUCOSE, CAPILLARY
GLUCOSE-CAPILLARY: 229 mg/dL — AB (ref 65–99)
Glucose-Capillary: 165 mg/dL — ABNORMAL HIGH (ref 65–99)

## 2017-09-23 LAB — CBC
HEMATOCRIT: 29.8 % — AB (ref 36.0–46.0)
HEMOGLOBIN: 9.5 g/dL — AB (ref 12.0–15.0)
MCH: 29.5 pg (ref 26.0–34.0)
MCHC: 31.9 g/dL (ref 30.0–36.0)
MCV: 92.5 fL (ref 78.0–100.0)
PLATELETS: 111 10*3/uL — AB (ref 150–400)
RBC: 3.22 MIL/uL — AB (ref 3.87–5.11)
RDW: 16.7 % — ABNORMAL HIGH (ref 11.5–15.5)
WBC: 7.3 10*3/uL (ref 4.0–10.5)

## 2017-09-23 MED ORDER — NICOTINE 21 MG/24HR TD PT24: 21.0000 mg | MEDICATED_PATCH | Freq: Every day | TRANSDERMAL | 0 refills | Status: DC

## 2017-09-23 MED ORDER — CEFUROXIME AXETIL 500 MG PO TABS: 500.0000 mg | ORAL_TABLET | Freq: Two times a day (BID) | ORAL | 0 refills | Status: DC

## 2017-09-23 NOTE — Progress Notes (Signed)
Case manager aware pt discharged and working on outpt PT per MD

## 2017-09-23 NOTE — Care Management Important Message (Signed)
Important Message  Patient Details  Name: Anita Mccormick MRN: [id number] Date of Birth: 19-Mar-1946   Medicare Important Message Given:  Yes    Orbie Pyo 09/23/2017, 3:59 PM

## 2017-09-23 NOTE — Discharge Summary (Signed)
Physician Discharge Summary  DEAUNDRA Mccormick QMG:[id number] DOB: 26-Nov-1945 DOA: 09/19/2017  PCP: Anita Ruddy, MD  Admit date: 09/19/2017 Discharge date: 09/23/2017  Time spent: 45 minutes  Recommendations for Outpatient Follow-up:  Patient will be discharged to home with outpatient physical therapy.  Patient will need to follow up with primary care provider within one week of discharge, repeat CBC.  Follow-up with Dr. Earlean Mccormick, gastroenterology within 1-2 weeks of discharge, discussed repeat colonoscopy.  Patient should continue medications as prescribed.  Patient should follow a heart healthy/carb modified diet.   Discharge Diagnoses:  Sepsis secondary to urinary tract infection/Ecoli bacteremia Normocytic Anemia/Pancytopenia Acute kidney injury  Diabetes mellitus, type II Essential hypertension Lower extremity edema/grade 1 diastolic dysfunction History of CVA  Tobacco abuse Physical deconditioning  Discharge Condition: stable  Diet recommendation: Heart healthy/carb modified  Filed Weights   09/21/17 0429 09/22/17 0516 09/23/17 0509  Weight: 79.2 kg (174 lb 8 oz) 77.7 kg (171 lb 3.2 oz) 79.2 kg (174 lb 8 oz)    History of present illness:  On 09/19/2017 by Dr. Ermalinda Memos Emersonis a 72 y.o.femalewith medical history significant ofHypertension, hyperlipidemia, diabetes mellitus, stroke, breast cancer in remission, tobacco abuse, CKD-2,dCHF, who presents with fever, chills, dysuria and urinary frequency.  Pt state that she has been feeling bad in thepast several days. She has feverand chill. She reportsdysuria, urgency and frequency. No flank pain.She also reports a headache.Denies neck rigidity. No unilateral weakness. Patient has nausea and vomited twice, but no diarrhea or abdominal pain. Patient does not have chest pain, shortness breath, cough, runny nose or sore throat. Patient states that her blood pressure has also been elevated recently.She hasbilateral  leg edema.  Hospital Course:  Sepsis secondary to urinary tract infection/Ecoli bacteremia -Patient presented with fever, tachycardic, tachypnea, leukopenia with elevated lactic acid level -Sepsis morphology appears to be improving -UA showed few bacteria, TNTC WBC, positive nitrites, large leukocytes -Urine culture <10K colonies (was added on after patient received antibiotics) -Blood culture showed gram-negative rods, BCID positive for enterobacteriaceae and E. Coli, resistant to ampicillin -Patient initially placed on Bactrim due to penicillin allergy -Discontinued IV fluid given the patient does have history of lower extremity edema and Lasix is currently being held -Was placed on cefepime, will discharge on ceftin 500mg  BID  Normocytic Anemia/Pancytopenia -Hemoglobin currently 6.1 baseline hemoglobin 12.8 back in 2018, was 8.1 in January 2019 -Transfused 2 units PRBC, hemoglobin today 9.5 -Anemia panel: Iron 19, Ferritin 14, saturation ratio5 -Continue iron supplementation -FOBT positive, discussed with patient, she does follow with Dr. Earlean Mccormick and has a colonscopy in 2012 which showed hemorrhoids and polyps.  Attempted to reach patient's gastroenterologist, Dr. Jaci Mccormick was unable to do so.f, however  -Leukopenia resolved -platelets currently 111, baseline 130s -Repeat CBC in 1 week, follow-up with Dr. Earlean Mccormick -Suspect FOBT positive from hemorrhoids.  Patient hemodynamically stable, denies bright red blood per rectum, melena or hematochezia.  Acute kidney injury  -Resolved -Upon review of patient's chart, Baseline creatinine approximately 0.8-0.9 -Creatinine was elevated at 1.49, currently down to 0.98 -Suspect secondary to sepsis as well as anemia  Diabetes mellitus, type II -Januvia, glipizide held-resume upon discharge -Patient was placed on insulin sliding scale during hospitalization -Last hemoglobin A1c was 6.8 on 03/18/2018  Essential hypertension -Lasix/Cozaar held  due to worsening renal function-May resume on discharge -Continue atenolol  Lower extremity edema/grade 1 diastolic dysfunction -Patient has been following with cardiology for this chronic lower extremity edema and leg pain.  She has  been on Lasix as an outpatient. -Echocardiogram 08/11/2017 showed an EF of 53-61%, grade 1 diastolic dysfunction with mild LVH -Cardiology not referring to this as CHF -Lasix currently held due to sepsis and AKI-however may resume on discharge  History of CVA  -Currently no neurological deficits, continue aspirin  Tobacco abuse -Discussed smoking cessation -Continue nicotine patch  Physical deconditioning -PT consulted, recommended outpatient physical therapy -Case management consulted  Procedures: None  Consultations: None  Discharge Exam: Vitals:   09/22/17 2120 09/23/17 0445  BP: (!) 172/65 (!) 139/52  Pulse:  70  Resp:  18  Temp:  98.5 F (36.9 C)  SpO2:  100%   Patient seen and examined on day of discharge.  Currently denies any chest pain, shortness breath, abdominal pain, nausea or vomiting, diarrhea or constipation.  States she is feeling better and stronger than previous days.   General: Well developed, well nourished, NAD, appears stated age  HEENT: NCAT, mucous membranes moist.  Neck: Supple  Cardiovascular: S1 S2 auscultated, 1/6SEM, RRR  Respiratory: Clear to auscultation bilaterally with equal chest rise  Abdomen: Soft, nontender, nondistended, + bowel sounds  Extremities: warm dry without cyanosis clubbing. +LE edema B/L  Neuro: AAOx3, nonfocal  Psych: pleasant, appropriate mood and affect  Discharge Instructions Discharge Instructions    Discharge instructions   Complete by:  As directed    Patient will be discharged to home with outpatient physical therapy.  Patient will need to follow up with primary care provider within one week of discharge, repeat CBC.  Follow-up with Dr. Earlean Mccormick, gastroenterology within  1-2 weeks of discharge, discussed repeat colonoscopy.  Patient should continue medications as prescribed.  Patient should follow a heart healthy/carb modified diet.     Allergies as of 09/23/2017      Reactions   Ace Inhibitors Cough   ????   Chlorthalidone    REACTION: unspecified   Metformin    REACTION: gi side effects   Penicillins    REACTION: urticaria (hives)   Sulfamethoxazole    REACTION: questionable      Medication List    STOP taking these medications   naproxen sodium 220 MG tablet Commonly known as:  ALEVE   potassium chloride SA 20 MEQ tablet Commonly known as:  K-DUR,KLOR-CON   traZODone 50 MG tablet Commonly known as:  DESYREL     TAKE these medications   aspirin 325 MG tablet Take 325 mg by mouth daily.   atenolol 100 MG tablet Commonly known as:  TENORMIN TAKE 1 TABLET BY MOUTH TWICE A DAY What changed:    how much to take  how to take this  when to take this   Calcium Plus Vitamin D 600-100 MG-UNIT Caps Take by mouth 2 (two) times daily.   cefUROXime 500 MG tablet Commonly known as:  CEFTIN Take 1 tablet (500 mg total) by mouth 2 (two) times daily for 12 days.   furosemide 20 MG tablet Commonly known as:  LASIX Take 1 tablet (20 mg total) by mouth every other day. AND HOLD IF NO SWELLING   glipiZIDE 10 MG 24 hr tablet Commonly known as:  GLUCOTROL XL Take 1 tablet (10 mg total) by mouth 2 (two) times daily.   glucose blood test strip Commonly known as:  ONE TOUCH ULTRA TEST 1 each by Other route 2 (two) times daily. Use as instructed   JANUVIA 100 MG tablet Generic drug:  sitaGLIPtin TAKE 1 TABLET BY MOUTH EVERY DAY What changed:  how much to take  how to take this  when to take this   losartan 50 MG tablet Commonly known as:  COZAAR Take 1 tablet (50 mg total) by mouth daily.   nicotine 21 mg/24hr patch Commonly known as:  NICODERM CQ - dosed in mg/24 hours Place 1 patch (21 mg total) onto the skin daily. Start  taking on:  09/24/2017      Allergies  Allergen Reactions  . Ace Inhibitors Cough    ????  . Chlorthalidone     REACTION: unspecified  . Metformin     REACTION: gi side effects  . Penicillins     REACTION: urticaria (hives)  . Sulfamethoxazole     REACTION: questionable   Follow-up Information    Anita Ruddy, MD. Schedule an appointment as soon as possible for a visit in 1 week(s).   Specialty:  Family Medicine Why:  Hospital follow up  Contact information: [place] [address] [phone]            The results of significant diagnostics from this hospitalization (including imaging, microbiology, ancillary and laboratory) are listed below for reference.    Significant Diagnostic Studies: Dg Chest 2 View  Result Date: 09/19/2017 CLINICAL DATA:  Fever, hypertension and headache. EXAM: CHEST  2 VIEW COMPARISON:  None. FINDINGS: The cardiomediastinal silhouette is unremarkable. There is no evidence of focal airspace disease, pulmonary edema, suspicious pulmonary nodule/mass, pleural effusion, or pneumothorax. No acute bony abnormalities are identified. IMPRESSION: No active cardiopulmonary disease. Electronically Signed   By: Margarette Canada M.D.   On: 09/19/2017 17:58    Microbiology: Recent Results (from the past 240 hour(s))  Blood Culture (routine x 2)     Status: Abnormal   Collection Time: 09/19/17  5:20 PM  Result Value Ref Range Status   Specimen Description BLOOD BLOOD LEFT FOREARM  Final   Special Requests   Final    BOTTLES DRAWN AEROBIC AND ANAEROBIC Blood Culture adequate volume   Culture  Setup Time   Final    GRAM NEGATIVE RODS ANAEROBIC BOTTLE ONLY CRITICAL RESULT CALLED TO, READ BACK BY AND VERIFIED WITH: B MANCHERIL,PHARMD AT 1004 09/20/17 BY L BENFIELD Performed at [facility], [place] [address], [place], [place] [address]    Culture ESCHERICHIA COLI (A)  Final   Report Status 09/22/2017 FINAL  Final   Organism ID,  Bacteria ESCHERICHIA COLI  Final      Susceptibility   Escherichia coli - MIC*    AMPICILLIN >=32 RESISTANT Resistant     CEFAZOLIN <=4 SENSITIVE Sensitive     CEFEPIME <=1 SENSITIVE Sensitive     CEFTAZIDIME <=1 SENSITIVE Sensitive     CEFTRIAXONE <=1 SENSITIVE Sensitive     CIPROFLOXACIN <=0.25 SENSITIVE Sensitive     GENTAMICIN 2 SENSITIVE Sensitive     IMIPENEM 0.5 SENSITIVE Sensitive     TRIMETH/SULFA <=20 SENSITIVE Sensitive     AMPICILLIN/SULBACTAM 16 INTERMEDIATE Intermediate     PIP/TAZO <=4 SENSITIVE Sensitive     Extended ESBL NEGATIVE Sensitive     * ESCHERICHIA COLI  Blood Culture ID Panel (Reflexed)     Status: Abnormal   Collection Time: 09/19/17  5:20 PM  Result Value Ref Range Status   Enterococcus species NOT DETECTED NOT DETECTED Final   Listeria monocytogenes NOT DETECTED NOT DETECTED Final   Staphylococcus species NOT DETECTED NOT DETECTED Final   Staphylococcus aureus NOT DETECTED NOT DETECTED Final   Streptococcus species NOT  DETECTED NOT DETECTED Final   Streptococcus agalactiae NOT DETECTED NOT DETECTED Final   Streptococcus pneumoniae NOT DETECTED NOT DETECTED Final   Streptococcus pyogenes NOT DETECTED NOT DETECTED Final   Acinetobacter baumannii NOT DETECTED NOT DETECTED Final   Enterobacteriaceae species DETECTED (A) NOT DETECTED Final    Comment: Enterobacteriaceae represent a large family of gram-negative bacteria, not a single organism. CRITICAL RESULT CALLED TO, READ BACK BY AND VERIFIED WITH: B MANCHERIL,PHARMD AT 1004 09/20/17 BY L BENFIELD    Enterobacter cloacae complex NOT DETECTED NOT DETECTED Final   Escherichia coli DETECTED (A) NOT DETECTED Final    Comment: CRITICAL RESULT CALLED TO, READ BACK BY AND VERIFIED WITH: B MANCHERIL,PHARMD AT 1004 09/20/17 BY L BENFIELD    Klebsiella oxytoca NOT DETECTED NOT DETECTED Final   Klebsiella pneumoniae NOT DETECTED NOT DETECTED Final   Proteus species NOT DETECTED NOT DETECTED Final   Serratia  marcescens NOT DETECTED NOT DETECTED Final   Carbapenem resistance NOT DETECTED NOT DETECTED Final   Haemophilus influenzae NOT DETECTED NOT DETECTED Final   Neisseria meningitidis NOT DETECTED NOT DETECTED Final   Pseudomonas aeruginosa NOT DETECTED NOT DETECTED Final   Candida albicans NOT DETECTED NOT DETECTED Final   Candida glabrata NOT DETECTED NOT DETECTED Final   Candida krusei NOT DETECTED NOT DETECTED Final   Candida parapsilosis NOT DETECTED NOT DETECTED Final   Candida tropicalis NOT DETECTED NOT DETECTED Final    Comment: Performed at [facility], [place]. [address] [place], [place] [address]  Blood Culture (routine x 2)     Status: Abnormal   Collection Time: 09/19/17  6:07 PM  Result Value Ref Range Status   Specimen Description BLOOD BLOOD LEFT ARM  Final   Special Requests   Final    BOTTLES DRAWN AEROBIC AND ANAEROBIC Blood Culture adequate volume   Culture  Setup Time   Final    GRAM NEGATIVE RODS IN BOTH AEROBIC AND ANAEROBIC BOTTLES CRITICAL RESULT CALLED TO, READ BACK BY AND VERIFIED WITH: B MANCHERIL,PHARMD AT 1110 09/20/17 BY L BENFIELD    Culture (A)  Final    ESCHERICHIA COLI SUSCEPTIBILITIES PERFORMED ON PREVIOUS CULTURE WITHIN THE LAST 5 DAYS. Performed at [facility], [place] [address] [place], [place] [address]    Report Status 09/22/2017 FINAL  Final  Urine Culture     Status: Abnormal   Collection Time: 09/20/17  8:09 AM  Result Value Ref Range Status   Specimen Description URINE, CLEAN CATCH  Final   Special Requests NONE  Final   Culture (A)  Final    <10,000 COLONIES/mL Performed at [facility], [place] [address], [place], [place] [address]    Report Status 09/21/2017 FINAL  Final     Labs: Basic Metabolic Panel: Recent Labs  Lab 09/20/17 0355 09/20/17 2113 09/21/17 0629 09/22/17 0527 09/23/17 0424  NA 136 137 137 138 135  K 4.4 3.8 4.3 4.2 4.0  CL 105 105 107 110 107  CO2 19* 21* 21* 20* 22  GLUCOSE 345* 237* 212*  182* 171*  BUN 17 20 20 18 16   CREATININE 1.49* 1.41* 1.25* 1.13* 0.98  CALCIUM 8.6* 8.8* 8.5* 8.6* 8.6*   Liver Function Tests: Recent Labs  Lab 09/19/17 1650  AST 68*  ALT 25  ALKPHOS 194*  BILITOT 1.1  PROT 7.2  ALBUMIN 2.7*   No results for input(s): LIPASE, AMYLASE in the last 168 hours. No results for input(s): AMMONIA in the last  168 hours. CBC: Recent Labs  Lab 09/19/17 1650 09/20/17 0355 09/20/17 2113 09/21/17 0629 09/22/17 0527 09/23/17 0424  WBC 2.0* 17.1* 16.0* 14.7* 11.4* 7.3  NEUTROABS 1.6*  --  12.9*  --   --   --   HGB 7.6* 6.1* 10.4* 9.7* 10.0* 9.5*  HCT 25.4* 21.3* 33.1* 30.8* 32.2* 29.8*  MCV 95.5 95.5 91.2 91.4 91.5 92.5  PLT 113* 106* 118* 111* 121* 111*   Cardiac Enzymes: No results for input(s): CKTOTAL, CKMB, CKMBINDEX, TROPONINI in the last 168 hours. BNP: BNP (last 3 results) Recent Labs    08/07/17 1545 09/19/17 2047  BNP 160* 334.9*    ProBNP (last 3 results) Recent Labs    08/04/17 1622 08/11/17 1604  PROBNP 257.0* 137.0*    CBG: Recent Labs  Lab 09/22/17 0745 09/22/17 1135 09/22/17 1619 09/22/17 2034 09/23/17 0733  GLUCAP 185* 186* 198* 167* 165*       Signed:  Lisabeth Mian  [facility] 09/23/2017, 11:02 AM

## 2017-09-23 NOTE — Progress Notes (Signed)
Pt IVs discontinued, catheters intact and telemetry removed. Pt has all equipment at bedside. Awaiting pt family to review discharge paper work

## 2017-09-23 NOTE — Discharge Instructions (Signed)
Bacteremia °Bacteremia is the presence of bacteria in the blood. When bacteria enter the bloodstream, they can cause a life-threatening reaction called sepsis, which is a medical emergency. Bacteremia can spread to other parts of the body, including the heart, joints, and brain. °What are the causes? °This condition is caused by bacteria that get into the blood. Bacteria can enter the blood: °· From a skin infection or a cut on your skin. °· During an episode of pneumonia. °· From an infection in your stomach or intestine (gastrointestinal infection). °· From an infection in your bladder or urinary system (urinary tract infection). °· During a dental or medical procedure. °· After you brush your teeth so hard that your gums bleed. °· When a bacterial infection in another part of the body spreads to the blood. °· Through a dirty needle. ° °What increases the risk? °This condition is more likely to develop in: °· Children. °· Elderly adults. °· People who have a long-lasting (chronic) disease or medical condition. °· People who have an artificial joint or heart valve. °· People who have heart valve disease. °· People who have a tube, such as a catheter or IV tube, that has been inserted for a medical treatment. °· People who have a weak body defense system (immune system). °· People who use IV drugs. ° °What are the signs or symptoms? °Symptoms of this condition include: °· Fever. °· Chills. °· A racing heart. °· Shortness of breath. °· Dizziness. °· Weakness. °· Confusion. °· Nausea or vomiting. °· Diarrhea. ° °In some cases, there are no symptoms. Bacteremia that has spread to the other parts of the body may cause symptoms in those areas. °How is this diagnosed? °This condition may be diagnosed with a physical exam and tests, such as: °· A complete blood count (CBC). This test looks for signs of infection. °· Blood cultures. These look for bacteria in your blood. °· Tests of any tubes that you may have inserted into  your body, such as an IV tube or urinary catheter. These tests look for a source of infection. °· Urine tests including urine cultures. These look for bacteria in the urine that could be a source of infection. °· Imaging tests, such as an X-ray, CT scan, MRI, or heart ultrasound. These look for a source of infection in other parts of the body, such as the lungs, heart valves, or joints. ° °How is this treated? °This condition may be treated with: °· Antibiotic medicines given through an IV infusion. Depending on the source of infection, antibiotics may be needed for several weeks. At first, an antibiotic may be given to kill most types of blood bacteria. If your test results show that a certain kind of bacteria is causing problems, the antibiotic may be changed to kill only the bacteria that are causing problems. °· Antibiotics taken by mouth. °· IV fluids to support the body as you fight the infection. °· Removing any catheter or device that could be a source of infection. °· Blood pressure and breathing support, if you have sepsis. °· Surgery to control the source or spread of infection, such as: °? Removing an infected implanted device. °? Removing infected tissue or an abscess. ° °This condition is usually treated at a hospital. If you are treated at home, you may need to come back for medicines, blood tests, and evaluation. This is important. °Follow these instructions at home: °· Take over-the-counter and prescription medicines only as told by your health care provider. °·   If you were prescribed an antibiotic, take it as told by your health care provider. Do not stop taking the antibiotic even if you start to feel better.  Rest until your condition is under control.  Drink enough fluid to keep your urine clear or pale yellow.  Do not smoke. If you need help quitting, ask your health care provider.  Keep all follow-up visits as told by your health care provider. This is important. How is this  prevented?  Get the vaccinations that your health care provider recommends.  Clean and cover any scrapes or cuts.  Take good care of your skin. This includes regular bathing and moisturizing.  Wash your hands often.  Practice good oral hygiene. Brush your teeth two times a day and floss regularly. Get help right away if:  You have pain.  You have a fever.  You have trouble breathing.  Your skin becomes blotchy, pale, or clammy.  You develop confusion, dizziness, or weakness.  You develop diarrhea.  You develop any new symptoms after treatment. Summary  Bacteremia is the presence of bacteria in the blood. When bacteria enter the bloodstream, they can cause a life- threatening reaction called sepsis.  Children and elderly adults are at increased risk of bacteremia. Other risk factors include having a long-lasting (chronic) disease or a weak immune system, having an artificial joint or heart valve, having heart valve disease, having tubes that were inserted in the body for medical treatment, or using IV drugs.  Some symptoms of bacteremia include fever, chills, shortness of breath, confusion, nausea or vomiting, and diarrhea.  Tests may be done to diagnose a source of infection that led to bacteremia. These tests may include blood tests, urine tests, and imaging tests.  Bacteremia is usually treated with antibiotics, usually in a hospital. This information is not intended to replace advice given to you by your health care provider. Make sure you discuss any questions you have with your health care provider. Document Released: 04/27/2006 Document Revised: 06/10/2016 Document Reviewed: 06/10/2016 Elsevier Interactive Patient Education  2018 [employer].  Urinary Tract Infection, Adult A urinary tract infection (UTI) is an infection of any part of the urinary tract, which includes the kidneys, ureters, bladder, and urethra. These organs make, store, and get rid of urine in the  body. UTI can be a bladder infection (cystitis) or kidney infection (pyelonephritis). What are the causes? This infection may be caused by fungi, viruses, or bacteria. Bacteria are the most common cause of UTIs. This condition can also be caused by repeated incomplete emptying of the bladder during urination. What increases the risk? This condition is more likely to develop if:  You ignore your need to urinate or hold urine for long periods of time.  You do not empty your bladder completely during urination.  You wipe back to front after urinating or having a bowel movement, if you are female.  You are uncircumcised, if you are female.  You are constipated.  You have a urinary catheter that stays in place (indwelling).  You have a weak defense (immune) system.  You have a medical condition that affects your bowels, kidneys, or bladder.  You have diabetes.  You take antibiotic medicines frequently or for long periods of time, and the antibiotics no longer work well against certain types of infections (antibiotic resistance).  You take medicines that irritate your urinary tract.  You are exposed to chemicals that irritate your urinary tract.  You are female.  What are the  signs or symptoms? Symptoms of this condition include:  Fever.  Frequent urination or passing small amounts of urine frequently.  Needing to urinate urgently.  Pain or burning with urination.  Urine that smells bad or unusual.  Cloudy urine.  Pain in the lower abdomen or back.  Trouble urinating.  Blood in the urine.  Vomiting or being less hungry than normal.  Diarrhea or abdominal pain.  Vaginal discharge, if you are female.  How is this diagnosed? This condition is diagnosed with a medical history and physical exam. You will also need to provide a urine sample to test your urine. Other tests may be done, including:  Blood tests.  Sexually transmitted disease (STD) testing.  If you  have had more than one UTI, a cystoscopy or imaging studies may be done to determine the cause of the infections. How is this treated? Treatment for this condition often includes a combination of two or more of the following:  Antibiotic medicine.  Other medicines to treat less common causes of UTI.  Over-the-counter medicines to treat pain.  Drinking enough water to stay hydrated.  Follow these instructions at home:  Take over-the-counter and prescription medicines only as told by your health care provider.  If you were prescribed an antibiotic, take it as told by your health care provider. Do not stop taking the antibiotic even if you start to feel better.  Avoid alcohol, caffeine, tea, and carbonated beverages. They can irritate your bladder.  Drink enough fluid to keep your urine clear or pale yellow.  Keep all follow-up visits as told by your health care provider. This is important.  Make sure to: ? Empty your bladder often and completely. Do not hold urine for long periods of time. ? Empty your bladder before and after sex. ? Wipe from front to back after a bowel movement if you are female. Use each tissue one time when you wipe. Contact a health care provider if:  You have back pain.  You have a fever.  You feel nauseous or vomit.  Your symptoms do not get better after 3 days.  Your symptoms go away and then return. Get help right away if:  You have severe back pain or lower abdominal pain.  You are vomiting and cannot keep down any medicines or water. This information is not intended to replace advice given to you by your health care provider. Make sure you discuss any questions you have with your health care provider. Document Released: 04/23/2005 Document Revised: 12/26/2015 Document Reviewed: 06/04/2015 Elsevier Interactive Patient Education  [employer].

## 2017-09-23 NOTE — Care Management Note (Signed)
Case Management Note  Patient Details  Name: Anita Mccormick MRN: [id number] Date of Birth: 27-Aug-1945  Subjective/Objective:   UTI                Action/Plan: Patient lives at home with spouse; PCP: Billie Ruddy, MD; has private insurance with [facility] with prescription drug coverage; pharmacy of choice is [facility]; she request a rolling walker at discharge; she is also agreeable to Outpatient Physical Therapy; referral sent to [facility] as requested.  Expected Discharge Date:  09/23/17               Expected Discharge Plan:  Home/Self Care  Discharge planning Services  CM Consult  Status of Service:  In process, will continue to follow  Sherrilyn Rist [phone] 09/23/2017, 11:21 AM

## 2017-09-23 NOTE — Progress Notes (Signed)
Discharge education provided at bedside with pt and pt husband  Pt discharged via wheelchair with nurse tech  Pt has all belongings including rolling walker

## 2017-09-24 ENCOUNTER — Telehealth: Payer: Self-pay | Admitting: Family Medicine

## 2017-09-24 NOTE — Telephone Encounter (Signed)
Transition Care Management Follow-up Telephone Call  Anita Mccormick QPR:[id number] DOB: Mar 30, 1946 DOA: 09/19/2017  PCP: Billie Ruddy, MD  Admit date: 09/19/2017 Discharge date: 09/23/2017  Time spent: 45 minutes  Recommendations for Outpatient Follow-up:  Patient will be discharged to home with outpatient physical therapy.  Patient will need to follow up with primary care provider within one week of discharge, repeat CBC.  Follow-up with Dr. Earlean Shawl, gastroenterology within 1-2 weeks of discharge, discussed repeat colonoscopy.  Patient should continue medications as prescribed.  Patient should follow a heart healthy/carb modified diet.   Discharge Diagnoses:  Sepsis secondary to urinary tract infection/Ecoli bacteremia Normocytic Anemia/Pancytopenia Acute kidney injury Diabetes mellitus, type II Essential hypertension Lower extremity edema/grade 1 diastolic dysfunction History of CVA  Tobacco abuse Physical deconditioning  Discharge Condition: stable    How have you been since you were released from the hospital? "better"   Do you understand why you were in the hospital? yes   Do you understand the discharge instructions? yes   Where were you discharged to? Home   Items Reviewed:  Medications reviewed: yes  Allergies reviewed: yes  Dietary changes reviewed: yes  Referrals reviewed: yes   Functional Questionnaire:   Activities of Daily Living (ADLs):   She states they are independent in the following: ambulation, bathing and hygiene, feeding, continence, grooming, toileting and dressing States they require assistance with the following: none   Any transportation issues/concerns?: no   Any patient concerns? no   Confirmed importance and date/time of follow-up visits scheduled yes  Provider Appointment booked with Dr. Volanda Napoleon on 09/30/2017 Wednesday at 11:00 am  Confirmed with patient if condition begins to worsen call PCP or go to the ER.  Patient  was given the office number and encouraged to call back with question or concerns.  : yes

## 2017-09-25 ENCOUNTER — Encounter: Payer: Self-pay | Admitting: Podiatry

## 2017-09-25 ENCOUNTER — Ambulatory Visit: Payer: Medicare Other | Admitting: Podiatry

## 2017-09-25 DIAGNOSIS — L84 Corns and callosities: Secondary | ICD-10-CM | POA: Diagnosis not present

## 2017-09-25 DIAGNOSIS — M79675 Pain in left toe(s): Secondary | ICD-10-CM | POA: Diagnosis not present

## 2017-09-25 DIAGNOSIS — B351 Tinea unguium: Secondary | ICD-10-CM | POA: Diagnosis not present

## 2017-09-25 DIAGNOSIS — M79674 Pain in right toe(s): Secondary | ICD-10-CM | POA: Diagnosis not present

## 2017-09-25 NOTE — Progress Notes (Signed)
Complaint:  Visit Type: Patient returns to my office for continued preventative foot care services. Complaint: Patient states" my nails have grown long and thick and become painful to walk and wear shoes" Patient has been diagnosed with DM with no foot complications. The patient presents for preventative foot care services. Patient states that she has painful corns noted on the fifth toes of both feet.  She presents to the office for preventative foot care services.  Podiatric Exam: Vascular: dorsalis pedis and posterior tibial pulses are palpable bilateral. Capillary return is immediate. Temperature gradient is WNL. Skin turgor WNL  Sensorium: Normal Semmes Weinstein monofilament test. Normal tactile sensation bilaterally. Nail Exam: Pt has thick disfigured discolored nails with subungual debris noted bilateral entire nail hallux through fifth toenails Ulcer Exam: There is no evidence of ulcer or pre-ulcerative changes or infection. Orthopedic Exam: Muscle tone and strength are WNL. No limitations in general ROM. No crepitus or effusions noted. Adducto-varus fifth digits  both fifth toes. Skin: No Porokeratosis. No infection or ulcers.  Lister corn  B/L  Diagnosis:  Onychomycosis, , Pain in right toe, pain in left toes,  Lister corns  Treatment & Plan Procedures and Treatment: Consent by patient was obtained for treatment procedures.   Debridement of mycotic and hypertrophic toenails, 1 through 5 bilateral and clearing of subungual debris. No ulceration, no infection noted. Debridement of lister corn. Return Visit-Office Procedure: Patient instructed to return to the office for a follow up visit 3 months for continued evaluation and treatment.    Gardiner Barefoot DPM

## 2017-09-28 ENCOUNTER — Inpatient Hospital Stay: Payer: Medicare Other | Admitting: Family Medicine

## 2017-09-30 ENCOUNTER — Encounter: Payer: Self-pay | Admitting: Family Medicine

## 2017-09-30 ENCOUNTER — Ambulatory Visit: Payer: Medicare Other | Admitting: Family Medicine

## 2017-09-30 VITALS — BP 130/50 | HR 78 | Temp 98.9°F | Wt 173.8 lb

## 2017-09-30 DIAGNOSIS — B9620 Unspecified Escherichia coli [E. coli] as the cause of diseases classified elsewhere: Secondary | ICD-10-CM | POA: Diagnosis not present

## 2017-09-30 DIAGNOSIS — J069 Acute upper respiratory infection, unspecified: Secondary | ICD-10-CM | POA: Diagnosis not present

## 2017-09-30 DIAGNOSIS — B9789 Other viral agents as the cause of diseases classified elsewhere: Secondary | ICD-10-CM

## 2017-09-30 DIAGNOSIS — Z09 Encounter for follow-up examination after completed treatment for conditions other than malignant neoplasm: Secondary | ICD-10-CM

## 2017-09-30 DIAGNOSIS — N390 Urinary tract infection, site not specified: Secondary | ICD-10-CM

## 2017-09-30 DIAGNOSIS — E1129 Type 2 diabetes mellitus with other diabetic kidney complication: Secondary | ICD-10-CM | POA: Diagnosis not present

## 2017-09-30 DIAGNOSIS — B373 Candidiasis of vulva and vagina: Secondary | ICD-10-CM | POA: Diagnosis not present

## 2017-09-30 DIAGNOSIS — B3731 Acute candidiasis of vulva and vagina: Secondary | ICD-10-CM

## 2017-09-30 DIAGNOSIS — H6123 Impacted cerumen, bilateral: Secondary | ICD-10-CM | POA: Diagnosis not present

## 2017-09-30 LAB — BASIC METABOLIC PANEL
BUN: 12 mg/dL (ref 6–23)
CHLORIDE: 101 meq/L (ref 96–112)
CO2: 32 meq/L (ref 19–32)
Calcium: 9.4 mg/dL (ref 8.4–10.5)
Creatinine, Ser: 0.95 mg/dL (ref 0.40–1.20)
GFR: 74.59 mL/min (ref >60.00)
GLUCOSE: 225 mg/dL — AB (ref 70–99)
POTASSIUM: 4 meq/L (ref 3.5–5.1)
Sodium: 138 mEq/L (ref 135–145)

## 2017-09-30 LAB — CBC WITH DIFFERENTIAL/PLATELET
Basophils Absolute: 0.1 10*3/uL (ref 0.0–0.1)
Basophils Relative: 0.9 % (ref 0.0–3.0)
Eosinophils Absolute: 0.2 10*3/uL (ref 0.0–0.7)
Eosinophils Relative: 2.9 % (ref 0.0–5.0)
HEMATOCRIT: 33.4 % — AB (ref 36.0–46.0)
Hemoglobin: 10.6 g/dL — ABNORMAL LOW (ref 12.0–15.0)
LYMPHS ABS: 0.9 10*3/uL (ref 0.7–4.0)
LYMPHS PCT: 13.8 % (ref 12.0–46.0)
MCHC: 31.7 g/dL (ref 30.0–36.0)
MCV: 92.8 fl (ref 78.0–100.0)
MONOS PCT: 11.3 % (ref 3.0–12.0)
Monocytes Absolute: 0.8 10*3/uL (ref 0.1–1.0)
NEUTROS ABS: 4.7 10*3/uL (ref 1.4–7.7)
NEUTROS PCT: 71.1 % (ref 43.0–77.0)
PLATELETS: 171 10*3/uL (ref 150.0–400.0)
RBC: 3.6 Mil/uL — ABNORMAL LOW (ref 3.87–5.11)
RDW: 17.6 % — ABNORMAL HIGH (ref 11.5–15.5)
WBC: 6.7 10*3/uL (ref 4.0–10.5)

## 2017-09-30 LAB — POCT GLYCOSYLATED HEMOGLOBIN (HGB A1C): Hemoglobin A1C: 7.2

## 2017-09-30 MED ORDER — FLUCONAZOLE 150 MG PO TABS: 150.0000 mg | ORAL_TABLET | Freq: Once | ORAL | 1 refills | Status: AC

## 2017-09-30 MED ORDER — BENZONATATE 100 MG PO CAPS: 100.0000 mg | ORAL_CAPSULE | Freq: Two times a day (BID) | ORAL | 0 refills | Status: DC | PRN

## 2017-09-30 NOTE — Patient Instructions (Addendum)

## 2017-09-30 NOTE — Progress Notes (Signed)
Subjective:    Patient ID: Anita Mccormick, female    DOB: [birth year], 72 y.o.   MRN: [id number]  Chief Complaint  Patient presents with  . Hospitalization Follow-up    Swelling in legs/ fever/ Bladder infection that spreaded to the patients blood "per patient"    HPI Patient was seen today for HFU.  Pt was admitted 2/23-2/27/19 for sepsis 2/2 E. Coli bacteremia 2/2 UTI and AKI.  UCx was obtained after abx started.  Bld Cx had gram negative rods, BCID positive for Enterobacteriaceae and E. Coli resistant to ampicillin.  Pt initially treated with Bactrim, then cefepime.  Was d/c'd home on ceftin 500 mg BID.  Pt was txf 2 u pRBCs for hgb of 6.1.  FOBT was positive, pt has a h/o hemorrhoids.  Pt followed by Dr. Earlean Shawl in the past.  Pt d/c'd home with PT.  Since d/c pt has been doing ok.  She states h/h was suppose to contact her, but has not done so yet.  Pt states she has been taking Cefuroxime 500 mg BID.  Pt has been using a walker to ambulate around the house.  She is trying to get her strength back.  Pt also endorses having a cold.  Symptoms include rhinorrhea, sneezing, slight cough which started on Sunday.  Pt denies sore throat, HA, or fever.  Pt was given nicotine patches in hospital.  She states insurance would not pay for them, so she is no longer using them.  States she may consider quitting smoking, but she wants to do it cold-turkey.  Pt also mentions she is having a yeast infection 2/2 abx use.    Past Medical History:  Diagnosis Date  . Cancer [facility])    history of no adjunctive RX( no chemo no RT)  . Cerebrovascular accident ([place])   . COLONIC POLYPS, HX OF 12/10/2006   Qualifier: Diagnosis of  By: Leanne Chang MD, Bruce    . Diabetes mellitus    Type 2  . Diabetes mellitus type II, controlled ([place]) 12/10/2006   Poor control on Januvia 100mg  and glipizide 10mg  XL Lab Results  Component Value Date   HGBA1C 8.3* 11/02/2014       . Eczema   . Fatty liver disease, nonalcoholic 70/62/3762   Per Dr. Leanne Chang Lab Results  Component Value Date   ALT 31 11/02/2014   AST 57* 11/02/2014   ALKPHOS 104 11/02/2014   BILITOT 1.1 11/02/2014      . Hypertension     Allergies  Allergen Reactions  . Ace Inhibitors Cough    ????  . Chlorthalidone     REACTION: unspecified  . Metformin     REACTION: gi side effects  . Penicillins     REACTION: urticaria (hives)  . Sulfamethoxazole     REACTION: questionable    ROS General: Denies fever, chills, night sweats, changes in weight, changes in appetite HEENT: Denies headaches, ear pain, changes in vision, sore throat  +rhinorrhea, sneezing CV: Denies CP, palpitations, SOB, orthopnea Pulm: Denies SOB, wheezing  +slight cough GI: Denies abdominal pain, nausea, vomiting, diarrhea, constipation GU: Denies dysuria, hematuria, frequency, vaginal discharge Msk: Denies muscle cramps, joint pains Neuro: Denies weakness, numbness, tingling Skin: Denies rashes, bruising Psych: Denies depression, anxiety, hallucinations     Objective:    Blood pressure (!) 130/50, pulse 78, temperature 98.9 F (37.2 C), temperature source Oral, weight 173 lb 12.8 oz (78.8 kg), SpO2 98 %.   Gen. Pleasant, well-nourished, in no distress, normal  affect   HEENT: [place]/AT, face symmetric,  no scleral icterus, PERRLA, nares patent with clear drainage, pharynx without erythema or exudate.  TMs occluded with cerumen bilaterally.  TMs normal appearing after irrigation. Lungs: no accessory muscle use, CTAB, no wheezes or rales Cardiovascular: RRR, no m/r/g, trace edema in b/l feet Abdomen: BS present, soft, NT/ND Neuro:  A&Ox3, CN II-XII intact, normal gait   Wt Readings from Last 3 Encounters:  09/30/17 173 lb 12.8 oz (78.8 kg)  09/23/17 174 lb 8 oz (79.2 kg)  09/07/17 167 lb (75.8 kg)    Lab Results  Component Value Date   WBC 7.3 09/23/2017   HGB 9.5 (L) 09/23/2017   HCT 29.8 (L) 09/23/2017   PLT 111 (L) 09/23/2017   GLUCOSE 171 (H) 09/23/2017   CHOL 146  03/18/2017   TRIG 154.0 (H) 03/18/2017   HDL 49.80 03/18/2017   LDLDIRECT 95.8 07/02/2006   LDLCALC 65 03/18/2017   ALT 25 09/19/2017   AST 68 (H) 09/19/2017   NA 135 09/23/2017   K 4.0 09/23/2017   CL 107 09/23/2017   CREATININE 0.98 09/23/2017   BUN 16 09/23/2017   CO2 22 09/23/2017   TSH 1.32 04/11/2014   INR 1.0 10/18/2007   HGBA1C 6.8 (H) 03/18/2017   MICROALBUR 0.2 04/11/2014    Assessment/Plan:  Hospital discharge follow-up -transitional care phone call reviewed.  E. coli UTI  -pt advised to continue cefuroxime 500 mg BID x 12 days -pt encouraged to increase po intake of fluids. - Plan: CBC with Differential/Platelet, Basic metabolic panel, CBC with Differential/Platelet  Bilateral impacted cerumen -informed consent given.  Pt tolerated procedure well.  TMs normal after irrigation. -suggested using Debrox ear gtts prn  Viral URI with cough  -supportive care - Plan: benzonatate (TESSALON) 100 MG capsule  Type 2 diabetes mellitus with other diabetic kidney complication, without long-term current use of insulin (HCC)  -Hgb A1C 7.2% this visit -continue glipizide 10 mg , januvia 100 mg daily -continue checking fsbs at home - Plan: POCT glycosylated hemoglobin (Hb A1C)  Candidiasis, vagina  - Plan: fluconazole (DIFLUCAN) 150 MG tablet  F/u in 4-6 wks, sooner if needed.  [name], MD

## 2017-10-01 NOTE — Progress Notes (Signed)
Cardiology Office Note   Date:  10/05/2017   ID:  Anita Mccormick, DOB 1945/08/24, MRN [id number]  PCP:  Billie Ruddy, MD  Cardiologist:  Dr. Martinique (has not seen patient yet). Chief Complaint  Patient presents with  . Follow-up     History of Present Illness: Anita Mccormick is a 72 y.o. female who presents for posthospitalization follow-up after admission for sepsis, UTI etiology, with known history of hypertension, hyperlipidemia, ongoing tobacco abuse, but other history to include diabetes, CVA breast cancer in remission.  She was treated for gram-negative, enterobacteriaceae and E-Coli noted on blood cultures. Found to be resistant to ampicillin. On discharge  09/23/2017, the patient's Lasix was held, and placed on Ceftin 500 mg twice a day hospitalization. She was noted to have acute kidney injury on admission, but on discharge and was 0.98. PT was ordered for home, as outpatient deconditioning improvement was necessary.  Has been released from the hospital, the patient has restarted her Lasix at 20 mg every other day, losartan has been discontinued. She taking her blood pressures at home and has brought recordings with her. Blood pressure is been running 178/86 on average. Compared to our blood pressure recordings, she is about 15 mmHg higher.  She's feeling better try to get her energy back. She denies any chest pain or abdominal pain, dizziness, or dyspnea.   Past Medical History:  Diagnosis Date  . Cancer [facility])    history of no adjunctive RX( no chemo no RT)  . Cerebrovascular accident ([place])   . COLONIC POLYPS, HX OF 12/10/2006   Qualifier: Diagnosis of  By: Leanne Chang MD, Bruce    . Diabetes mellitus    Type 2  . Diabetes mellitus type II, controlled ([place]) 12/10/2006   Poor control on Januvia 100mg  and glipizide 10mg  XL Lab Results  Component Value Date   HGBA1C 8.3* 11/02/2014       . Eczema   . Fatty liver disease, nonalcoholic 25/11/3974   Per Dr. Leanne Chang Lab Results   Component Value Date   ALT 31 11/02/2014   AST 57* 11/02/2014   ALKPHOS 104 11/02/2014   BILITOT 1.1 11/02/2014      . Hypertension     Past Surgical History:  Procedure Laterality Date  . CATARACT EXTRACTION Left   . MASTECTOMY Left   . TUBAL LIGATION       Current Outpatient Medications  Medication Sig Dispense Refill  . aspirin 325 MG tablet Take 325 mg by mouth daily.      [name] atenolol (TENORMIN) 100 MG tablet TAKE 1 TABLET BY MOUTH TWICE A DAY (Patient taking differently: TAKE 1 TABLET (100 MG) BY MOUTH TWICE A DAY) 180 tablet 3  . Calcium Carbonate-Vitamin D (CALCIUM PLUS VITAMIN D) 600-100 MG-UNIT CAPS Take by mouth 2 (two) times daily.      . furosemide (LASIX) 20 MG tablet Take 1 tablet (20 mg total) by mouth every other day. AND HOLD IF NO SWELLING 30 tablet 3  . glipiZIDE (GLUCOTROL XL) 10 MG 24 hr tablet Take 1 tablet (10 mg total) by mouth 2 (two) times daily. 90 tablet 2  . glucose blood (ONE TOUCH ULTRA TEST) test strip 1 each by Other route 2 (two) times daily. Use as instructed 100 each 3  . JANUVIA 100 MG tablet TAKE 1 TABLET BY MOUTH EVERY DAY (Patient taking differently: TAKE 1 TABLET (100 MG)  BY MOUTH EVERY DAY) 90 tablet 1   No current facility-administered medications for  this visit.     Allergies:   Ace inhibitors; Chlorthalidone; Metformin; Penicillins; and Sulfamethoxazole    Social History:  The patient  reports that she has been smoking cigarettes.  She has been smoking about 0.10 packs per day. she has never used smokeless tobacco. She reports that she drinks alcohol. She reports that she does not use drugs.   Family History:  The patient's family history includes Cancer in her father; Pancreatic cancer in her sister; Stroke in her mother.    ROS: All other systems are reviewed and negative. Unless otherwise mentioned in H&P    PHYSICAL EXAM: VS:  BP (!) 153/69   Pulse 73   Ht 5\' 2"  (1.575 m)   Wt 165 lb (74.8 kg)   BMI 30.18 kg/m  , BMI Body mass  index is 30.18 kg/m. GEN: Well nourished, well developed, in no acute distress  HEENT: normal  Neck: no JVD, carotid bruits, or masses Cardiac: RRR; no murmurs, rubs, or gallops,no edema  Respiratory:  clear to auscultation bilaterally, normal work of breathing GI: soft, nontender, nondistended, + BS MS: no deformity or atrophy  Skin: warm and dry, no rash Neuro:  Strength and sensation are intact Psych: euthymic mood, full affect  Recent Labs: 2017/09/10: Pro B Natriuretic peptide (BNP) 137.0 09/19/2017: ALT 25; B Natriuretic Peptide 334.9 09/30/2017: BUN 12; Creatinine, Ser 0.95; Hemoglobin 10.6; Platelets 171.0; Potassium 4.0; Sodium 138    Lipid Panel    Component Value Date/Time   CHOL 146 03/18/2017 0851   TRIG 154.0 (H) 03/18/2017 0851   TRIG 202 (HH) 07/02/2006 1110   HDL 49.80 03/18/2017 0851   CHOLHDL 3 03/18/2017 0851   VLDL 30.8 03/18/2017 0851   LDLCALC 65 03/18/2017 0851   LDLDIRECT 95.8 07/02/2006 1110      Wt Readings from Last 3 Encounters:  10/05/17 165 lb (74.8 kg)  09/30/17 173 lb 12.8 oz (78.8 kg)  09/23/17 174 lb 8 oz (79.2 kg)    Other studies Reviewed: Echocardiogram 2017/09/10 Left ventricle: The cavity size was normal. Wall thickness was   increased in a pattern of mild LVH. Systolic function was normal.   The estimated ejection fraction was in the range of 60% to 65%.   Wall motion was normal; there were no regional wall motion   abnormalities. Doppler parameters are consistent with abnormal   left ventricular relaxation (grade 1 diastolic dysfunction).   Doppler parameters are consistent with high ventricular filling   pressure. - Mitral valve: Calcified annulus. - Left atrium: The atrium was mildly dilated.  Impressions:  - Normal LV systolic function; mild LVH; mild diastolic   dysfunction; mild LAE.  ASSESSMENT AND PLAN:  1. Hypertension: Blood pressure recordings are higher at home on her machine compared to our recording. She is  advised to avoid salt, and bring her blood pressure she with her next office visit for comparison. Has not yet been seen by Dr. Martinique, and will need to be seen by him at least once, on follow-up. I will not restart losartan at this time until I look at follow-up labs, BMET.  2. Chronic diastolic heart failure: She has significant lower extremity edema pretibial 2+ to the knees. She is taking Lasix 20 mg every other day. Didn't have her take Lasix 20 mg daily for the next 3 days. She'll follow-up on Thursday, 10/08/2017 for blood pressure check, weight, and BMET.  3. Diabetes: She requests a fingerstick blood sugar here in the office. Reading was 280.  She is advised to follow-up with PCP for better control.   Current medicines are reviewed at length with the patient today.    Labs/ tests ordered today include: BMET  Phill Myron. [place] Pugh, ANP, AACC   10/05/2017 1:56 PM    [facility] [address] [place], [place] [address] Phone: [phone]; Fax: [phone]

## 2017-10-05 ENCOUNTER — Encounter: Payer: Self-pay | Admitting: Adult Health

## 2017-10-05 ENCOUNTER — Ambulatory Visit (INDEPENDENT_AMBULATORY_CARE_PROVIDER_SITE_OTHER): Payer: Medicare Other | Admitting: Adult Health

## 2017-10-05 VITALS — BP 153/69 | HR 73 | Ht 62.0 in | Wt 165.0 lb

## 2017-10-05 DIAGNOSIS — I10 Essential (primary) hypertension: Secondary | ICD-10-CM | POA: Diagnosis not present

## 2017-10-05 DIAGNOSIS — Z79899 Other long term (current) drug therapy: Secondary | ICD-10-CM | POA: Diagnosis not present

## 2017-10-05 DIAGNOSIS — I5032 Chronic diastolic (congestive) heart failure: Secondary | ICD-10-CM | POA: Diagnosis not present

## 2017-10-05 NOTE — Patient Instructions (Addendum)
Medication Instructions:  TAKE EXTRA LASIX X2 DAYS (TUE AND WED)  If you need a refill on your cardiac medications before your next appointment, please call your pharmacy.  Labwork: BMET THURSDAY HERE IN OUR OFFICE AT [facility]  Special Instructions: [place] BP CHECK ON Thursday AM WITH PHARMACIST  Follow-Up: Your physician wants you to follow-up in: 3 MONTHS WITH DR Martinique.   Thank you for choosing [facility] at [facility]!!

## 2017-10-06 ENCOUNTER — Telehealth: Payer: Self-pay

## 2017-10-06 NOTE — Telephone Encounter (Signed)
Error

## 2017-10-08 DIAGNOSIS — Z79899 Other long term (current) drug therapy: Secondary | ICD-10-CM | POA: Diagnosis not present

## 2017-10-08 LAB — BASIC METABOLIC PANEL
BUN/Creatinine Ratio: 11 — ABNORMAL LOW (ref 12–28)
BUN: 10 mg/dL (ref 8–27)
CALCIUM: 9.2 mg/dL (ref 8.7–10.3)
CO2: 27 mmol/L (ref 20–29)
Chloride: 101 mmol/L (ref 96–106)
Creatinine, Ser: 0.89 mg/dL (ref 0.57–1.00)
GFR, EST AFRICAN AMERICAN: 76 mL/min/{1.73_m2} (ref >59)
GFR, EST NON AFRICAN AMERICAN: 66 mL/min/{1.73_m2} (ref >59)
Glucose: 242 mg/dL — ABNORMAL HIGH (ref 65–99)
Potassium: 3.8 mmol/L (ref 3.5–5.2)
Sodium: 141 mmol/L (ref 134–144)

## 2017-10-13 ENCOUNTER — Ambulatory Visit: Payer: Medicare Other | Admitting: Pharmacist

## 2017-10-13 VITALS — BP 138/56 | HR 76

## 2017-10-13 DIAGNOSIS — I10 Essential (primary) hypertension: Secondary | ICD-10-CM | POA: Diagnosis not present

## 2017-10-13 MED ORDER — LOSARTAN POTASSIUM 100 MG PO TABS: 100.0000 mg | ORAL_TABLET | Freq: Every day | ORAL | 3 refills | Status: DC

## 2017-10-13 NOTE — Assessment & Plan Note (Addendum)
Patient's blood pressure today in clinic was 138/56 mmHg. Patient reported her home readings have remained elevated in the 170/60s mmHg. She is tolerating her current regimen well. Her most recent labs show her Scr has trended down since her recent hospitalization in February at 0.89.  However, she is still experiencing some lower extremity edema.  Will increase losartan dose to 100 mg daily. Continue furosemide 20 mg daily (every other day if edema is absent). Provided patient with a log to record blood pressure at home. Instructed her to bring her BP log and home BP cuff to the next office visit. She was also educated on the importance of a healthy low sodium diet.

## 2017-10-13 NOTE — Progress Notes (Signed)
Patient ID: Anita Mccormick                 DOB: [birth year]                      MRN: [id number]     HPI: Anita Mccormick is a 72 y.o. female referred to [place] by Jory Sims, NP. She is a patient of Dr. Martinique. PMH is significant for hypertension, chronic diastolic heart failure, hyperlipidemia, diabetes, breast cancer in remission, and CVA. She was recently hospitalized in 08/2017 for sepsis, UTI, and an AKI. Her losartan was discontinued during this hospitalization and her lasix was switched to every other day. She presents today for blood pressure follow-up. Patient denies chest pain, SOB, and dizziness. However, she has noticed swelling of ankles/feet daily.   Current HTN meds:   Furosemide 20 mg daily   Losartan 50mg  daily  Atenolol 100 mg twice daily  Previously tried:   Losartan 100 mg (since 2013)  Quinapril 20 mg  Micardis 80 mg   Telmisartan 80 mg   BP goal: 130/80 mmHg  Family History:   Mother had a history of a stroke and hypertension.  Father died from prostate cancer.  Sister has a history of pancreatic cancer.   Sister and brother have a history of high blood pressure.    Social History:   Tobacco: 2 cigarettes a day. Smoked for 12 years.    Alcohol: ~ 2 drinks/week (Mixed drinks)  Caffeine: Denies any caffeine use.  Diet:   Cooks most of meals. She eats out occasionally especially after church on Sundays.   Does not cook with salt generally, if she does she puts it in the food before it is cooked.   Eats vegetables mostly fresh (when they're in season). If vegetables aren't in season she  uses canned vegetables.   Exercise:   Patient does not exercise due to weakness in her legs.    Home BP readings:   Patient checks blood pressure twice daily at home.   She forgot to bring her readings today, but she reports  Her readings have been in the  170/60s mmHg.  Labs:   10/08/2017: Scr 0.89, Na 141, K 3.8  BNP 2/19: 334.9   DG Chest 2 view: No active  cardiopulmonary disease  Echo 08/11/17: Normal LV systolic function, mild LVH, mild diastolic dysfunction, mild LAE    Wt Readings from Last 3 Encounters:  10/05/17 165 lb (74.8 kg)  09/30/17 173 lb 12.8 oz (78.8 kg)  09/23/17 174 lb 8 oz (79.2 kg)   BP Readings from Last 3 Encounters:  10/13/17 (!) 138/56  10/05/17 (!) 153/69  09/30/17 (!) 130/50   Pulse Readings from Last 3 Encounters:  10/13/17 76  10/05/17 73  09/30/17 78   Past Medical History:  Diagnosis Date  . Cancer [facility])    history of no adjunctive RX( no chemo no RT)  . Cerebrovascular accident ([place])   . COLONIC POLYPS, HX OF 12/10/2006   Qualifier: Diagnosis of  By: Leanne Chang MD, Bruce    . Diabetes mellitus    Type 2  . Diabetes mellitus type II, controlled ([place]) 12/10/2006   Poor control on Januvia 100mg  and glipizide 10mg  XL Lab Results  Component Value Date   HGBA1C 8.3* 11/02/2014       . Eczema   . Fatty liver disease, nonalcoholic 73/22/0254   Per Dr. Leanne Chang Lab Results  Component Value Date   ALT  31 11/02/2014   AST 57* 11/02/2014   ALKPHOS 104 11/02/2014   BILITOT 1.1 11/02/2014      . Hypertension     Current Outpatient Medications on File Prior to Visit  Medication Sig Dispense Refill  . aspirin 325 MG tablet Take 325 mg by mouth daily.      [name] atenolol (TENORMIN) 100 MG tablet TAKE 1 TABLET BY MOUTH TWICE A DAY (Patient taking differently: TAKE 1 TABLET (100 MG) BY MOUTH TWICE A DAY) 180 tablet 3  . Calcium Carbonate-Vitamin D (CALCIUM PLUS VITAMIN D) 600-100 MG-UNIT CAPS Take by mouth 2 (two) times daily.      . furosemide (LASIX) 20 MG tablet Take 1 tablet (20 mg total) by mouth every other day. AND HOLD IF NO SWELLING 30 tablet 3  . glipiZIDE (GLUCOTROL XL) 10 MG 24 hr tablet Take 1 tablet (10 mg total) by mouth 2 (two) times daily. 90 tablet 2  . glucose blood (ONE TOUCH ULTRA TEST) test strip 1 each by Other route 2 (two) times daily. Use as instructed 100 each 3  . JANUVIA 100 MG tablet TAKE 1 TABLET  BY MOUTH EVERY DAY (Patient taking differently: TAKE 1 TABLET (100 MG)  BY MOUTH EVERY DAY) 90 tablet 1   No current facility-administered medications on file prior to visit.     Allergies  Allergen Reactions  . Ace Inhibitors Cough    ????  . Chlorthalidone     REACTION: unspecified  . Metformin     REACTION: gi side effects  . Penicillins     REACTION: urticaria (hives)  . Sulfamethoxazole     REACTION: questionable    Blood pressure (!) 138/56, pulse 76.  Essential hypertension Patient's blood pressure today in clinic was 138/56 mmHg. Patient reported her home readings have remained elevated in the 170/60s mmHg. She is tolerating her current regimen well. Her most recent labs show her Scr has trended down since her recent hospitalization in February at 0.89.  However, she is still experiencing some lower extremity edema.  Will increase losartan dose to 100 mg daily. Continue furosemide 20 mg daily (every other day if edema is absent). Provided patient with a log to record blood pressure at home. Instructed her to bring her BP log and home BP cuff to the next office visit. She was also educated on the importance of a healthy low sodium diet.   Note written by: Jerilynn Mages, PharmD Candidate.   Present during OV. Assessment and plan discussed and approved by Tahirah Sara Rodriguez-Guzman PharmD, BCPS, [place] [id number] 10/14/2017 12:21 PM

## 2017-10-13 NOTE — Patient Instructions (Signed)
Return for a follow up appointment in 5 weeks (11/17/17)  Check your blood pressure at home daily (if able) and keep record of the readings.  Take your BP meds as follows: Increase losartan to 100 mg daily (1 tablet) Continue taking furosemide 20 mg daily  Continue taking atenolol 100 mg twice daily  Bring all of your meds, your BP cuff and your record of home blood pressures to your next appointment.  Exercise as you're able, try to walk approximately 30 minutes per day.  Keep salt intake to a minimum, especially watch canned and prepared boxed foods.  Eat more fresh fruits and vegetables and fewer canned items.  Avoid eating in fast food restaurants.    HOW TO TAKE YOUR BLOOD PRESSURE: . Rest 5 minutes before taking your blood pressure. .  Don't smoke or drink caffeinated beverages for at least 30 minutes before. . Take your blood pressure before (not after) you eat. . Sit comfortably with your back supported and both feet on the floor (don't cross your legs). . Elevate your arm to heart level on a table or a desk. . Use the proper sized cuff. It should fit smoothly and snugly around your bare upper arm. There should be enough room to slip a fingertip under the cuff. The bottom edge of the cuff should be 1 inch above the crease of the elbow. . Ideally, take 3 measurements at one sitting and record the average.

## 2017-10-14 ENCOUNTER — Encounter: Payer: Self-pay | Admitting: Pharmacist

## 2017-10-17 ENCOUNTER — Other Ambulatory Visit: Payer: Self-pay | Admitting: Family Medicine

## 2017-10-17 DIAGNOSIS — R600 Localized edema: Secondary | ICD-10-CM

## 2017-10-19 ENCOUNTER — Other Ambulatory Visit: Payer: Self-pay

## 2017-10-19 ENCOUNTER — Telehealth: Payer: Self-pay | Admitting: Family Medicine

## 2017-10-19 MED ORDER — ATENOLOL 100 MG PO TABS: ORAL_TABLET | ORAL | 3 refills | Status: DC

## 2017-10-19 NOTE — Telephone Encounter (Signed)
Please advise sometimes insurance pays for it sometimes they don't.  Please send in rx for bp cuff to pt's pharmacy.

## 2017-10-19 NOTE — Telephone Encounter (Signed)
Please advise 

## 2017-10-19 NOTE — Telephone Encounter (Signed)
Copied from [place] [phone]. Topic: Quick Communication - See Telephone Encounter >> Oct 19, 2017 12:20 PM Boyd Kerbs wrote: CRM for notification.   Pt. Is asking for a monitor BP Cup -  [facility] [phone]  will need PA  See Telephone encounter for: 10/19/17.

## 2017-10-20 ENCOUNTER — Other Ambulatory Visit: Payer: Self-pay | Admitting: Family Medicine

## 2017-10-20 DIAGNOSIS — I10 Essential (primary) hypertension: Secondary | ICD-10-CM

## 2017-10-20 NOTE — Telephone Encounter (Signed)
Pt was called and made aware that Rx was sent to the pharmacy.

## 2017-11-04 ENCOUNTER — Encounter: Payer: Self-pay | Admitting: Family Medicine

## 2017-11-04 ENCOUNTER — Telehealth: Payer: Self-pay | Admitting: Family Medicine

## 2017-11-04 ENCOUNTER — Ambulatory Visit: Payer: Medicare Other | Admitting: Family Medicine

## 2017-11-04 ENCOUNTER — Other Ambulatory Visit: Payer: Self-pay | Admitting: Family Medicine

## 2017-11-04 VITALS — BP 84/50 | HR 78 | Temp 98.5°F | Wt 158.5 lb

## 2017-11-04 DIAGNOSIS — I959 Hypotension, unspecified: Secondary | ICD-10-CM | POA: Diagnosis not present

## 2017-11-04 DIAGNOSIS — R600 Localized edema: Secondary | ICD-10-CM | POA: Diagnosis not present

## 2017-11-04 DIAGNOSIS — E871 Hypo-osmolality and hyponatremia: Secondary | ICD-10-CM

## 2017-11-04 LAB — BASIC METABOLIC PANEL
BUN: 14 mg/dL (ref 6–23)
CO2: 26 meq/L (ref 19–32)
Calcium: 9.2 mg/dL (ref 8.4–10.5)
Chloride: 93 mEq/L — ABNORMAL LOW (ref 96–112)
Creatinine, Ser: 1.2 mg/dL (ref 0.40–1.20)
GFR: 56.95 mL/min — ABNORMAL LOW (ref >60.00)
Glucose, Bld: 350 mg/dL — ABNORMAL HIGH (ref 70–99)
POTASSIUM: 4 meq/L (ref 3.5–5.1)
SODIUM: 129 meq/L — AB (ref 135–145)

## 2017-11-04 LAB — BRAIN NATRIURETIC PEPTIDE: PRO B NATRI PEPTIDE: 82 pg/mL (ref 0.0–100.0)

## 2017-11-04 MED ORDER — BLOOD PRESSURE CUFF MISC: 0 refills | Status: DC

## 2017-11-04 MED ORDER — ONETOUCH ULTRALINK W/DEVICE KIT: PACK | 0 refills | Status: DC

## 2017-11-04 NOTE — Patient Instructions (Addendum)
-  Obtain the bp cuff so that you can start checking your blood pressure at home. -Hold off on taking your Atenolol.  Do not take your second dose of Atenolol 100 mg this afternoon.   I would like your blood pressure to come back up and to get your lab results back before continuing this medication as the dose may need to be decreased. -You can continue taking lasix 20 mg for now. -Please check and see if you have been taking losartan and which dose if you have been. -I would like to see you back in clinic on Friday. -Your cardiology appointment has been moved.  Next week with Dr. Martinique.

## 2017-11-04 NOTE — Progress Notes (Signed)
Subjective:    Patient ID: Anita Mccormick, female    DOB: Jul 30, 1945, 72 y.o.   MRN: [id number]  Chief Complaint  Patient presents with  . Leg Swelling    HPI Patient was seen today for LE edema.  Pt endorses continued LE edema with pain and "knots" in her legs.  Pt is taking lasix 20 mg daily.  Pt states she was told to continue taking lasix until the edema goes away, but it never went away.   Pt denies SOB, CP, HA, dizziness, extreme changes in wt.  Patient's blood pressure medications reviewed.  She is taking atenolol 100 mg twice daily and Lasix 20 mg daily.  Patient does not think she is taking losartan at all.  Past Medical History:  Diagnosis Date  . Cancer [facility])    history of no adjunctive RX( no chemo no RT)  . Cerebrovascular accident ([place])   . COLONIC POLYPS, HX OF 12/10/2006   Qualifier: Diagnosis of  By: Leanne Chang MD, Bruce    . Diabetes mellitus    Type 2  . Diabetes mellitus type II, controlled ([place]) 12/10/2006   Poor control on Januvia 100mg  and glipizide 10mg  XL Lab Results  Component Value Date   HGBA1C 8.3* 11/02/2014       . Eczema   . Fatty liver disease, nonalcoholic 83/15/1761   Per Dr. Leanne Chang Lab Results  Component Value Date   ALT 31 11/02/2014   AST 57* 11/02/2014   ALKPHOS 104 11/02/2014   BILITOT 1.1 11/02/2014      . Hypertension     Allergies  Allergen Reactions  . Ace Inhibitors Cough    ????  . Chlorthalidone     REACTION: unspecified  . Metformin     REACTION: gi side effects  . Penicillins     REACTION: urticaria (hives)  . Sulfamethoxazole     REACTION: questionable    ROS General: Denies fever, chills, night sweats, changes in weight, changes in appetite HEENT: Denies headaches, ear pain, changes in vision, rhinorrhea, sore throat CV: Denies CP, palpitations, SOB, orthopnea Pulm: Denies SOB, cough, wheezing GI: Denies abdominal pain, nausea, vomiting, diarrhea, constipation GU: Denies dysuria, hematuria, frequency, vaginal discharge Msk:  Denies muscle cramps, joint pains  + LE edema b/l Neuro: Denies weakness, numbness, tingling Skin: Denies rashes, bruising Psych: Denies depression, anxiety, hallucinations     Objective:    Blood pressure (!) 84/50, pulse 78, temperature 98.5 F (36.9 C), temperature source Oral, weight 158 lb 8 oz (71.9 kg), SpO2 97 %. BP recheck 84/38  Gen. Pleasant, well-nourished, in no distress, normal affect   HEENT: [place]/AT, face symmetric, no scleral icterus, PERRLA, nares patent without drainage Lungs: no accessory muscle use, CTAB, no wheezes or rales Cardiovascular: RRR, no m/r/g, peripheral edema Neuro:  A&Ox3, CN II-XII intact, normal gait Skin:  Warm, no lesions/ rash.  1+ pitting edema in b/l LEs, trace at knees.  Mild ecchymosis of L ankle, skin taught.   Wt Readings from Last 3 Encounters:  11/04/17 158 lb 8 oz (71.9 kg)  10/05/17 165 lb (74.8 kg)  09/30/17 173 lb 12.8 oz (78.8 kg)    Lab Results  Component Value Date   WBC 6.7 09/30/2017   HGB 10.6 (L) 09/30/2017   HCT 33.4 (L) 09/30/2017   PLT 171.0 09/30/2017   GLUCOSE 242 (H) 10/08/2017   CHOL 146 03/18/2017   TRIG 154.0 (H) 03/18/2017   HDL 49.80 03/18/2017   LDLDIRECT 95.8 07/02/2006  LDLCALC 65 03/18/2017   ALT 25 09/19/2017   AST 68 (H) 09/19/2017   NA 141 10/08/2017   K 3.8 10/08/2017   CL 101 10/08/2017   CREATININE 0.89 10/08/2017   BUN 10 10/08/2017   CO2 27 10/08/2017   TSH 1.32 04/11/2014   INR 1.0 10/18/2007   HGBA1C 7.2 09/30/2017   MICROALBUR 0.2 04/11/2014    Assessment/Plan:  Bilateral lower extremity edema  -Continue Lasix 20 mg daily. -We will adjust medications as needed based on labs. -Contacted heart care in regards to getting pt a sooner appointment.  Pt now has an appt next with Dr. Martinique. - Plan: Brain Natriuretic Peptide, Basic metabolic panel  Hypotension, unspecified hypotension type -bp recheck 84/38 -pt asymptomatic -pt given crackers and water to drink while in  clinic. -pt encouraged to pick up bp monitor today and check bp a few times this afternoon.  Given rx for monitor. -Pt to hold Atenolol (this afternoon's dose and for the next 2 days until f/u). -pt to f/u in clinic on Friday. -pt encouraged to check her meds at home to see if she has been taking losartan.  Per chart review,  conflicting notes regarding the dosage of the medication/ if it was to be taken. -Given ED precautions.  [name], MD

## 2017-11-04 NOTE — Telephone Encounter (Signed)
Copied from [place]. Topic: Quick Communication - See Telephone Encounter >> Nov 04, 2017  2:10 PM Arletha Grippe wrote: CRM for notification. See Telephone encounter for: 11/04/17. Pcp wanted to know- pt is on losartan 100 mg.  Please let pt know if you want to make any changes  [phone]

## 2017-11-05 ENCOUNTER — Other Ambulatory Visit: Payer: Self-pay | Admitting: Family Medicine

## 2017-11-05 LAB — BASIC METABOLIC PANEL
BUN: 11 mg/dL (ref 6–23)
CALCIUM: 9.4 mg/dL (ref 8.4–10.5)
CO2: 28 mEq/L (ref 19–32)
CREATININE: 1.07 mg/dL (ref 0.40–1.20)
Chloride: 97 mEq/L (ref 96–112)
GFR: 65.01 mL/min (ref >60.00)
Glucose, Bld: 473 mg/dL — ABNORMAL HIGH (ref 70–99)
Potassium: 4.3 mEq/L (ref 3.5–5.1)
SODIUM: 134 meq/L — AB (ref 135–145)

## 2017-11-05 NOTE — Addendum Note (Signed)
Addended by: Elmer Picker on: 11/05/2017 03:14 PM   Modules accepted: Orders

## 2017-11-05 NOTE — Telephone Encounter (Signed)
Please note

## 2017-11-05 NOTE — Progress Notes (Signed)
Pt returned to clinic today for repeat BMP, as Na was 129 yesterday.  This provider was notified pt's blood sugar was 473 on BMP today.  Pt asymptomatic.  Pt seen briefly by this provider.  Pt endorses just taking her glipizide XR prior to arrival at clinic.  Pt given 5 units Humalog while in the clinic.  Pt observed for 30 minutes.  FSBS rechecked and decreased to 401.  Will have pt to check fsbs this evening.  Pt is adamant about not being willing to go to the hospital.  Pt has an upcoming appointment tomorrow.  Discussed warning signs and given ED precautions.  Pt expressed understanding.  Pt given a glucometer and testing supplies while in clinic.  Pt to bring all her meds with her to [facility] tomorrow.  Pt to continue to hold off on bp meds as was hypotensive in clinic yesterday.  Will likely restart a few of her bp meds during tomorrow's OFV.  BP in office today was 120s/70s.  [name], MD

## 2017-11-05 NOTE — Telephone Encounter (Signed)
Noted. Pt also seen briefly this afternoon while having labs drawn.

## 2017-11-06 ENCOUNTER — Ambulatory Visit (INDEPENDENT_AMBULATORY_CARE_PROVIDER_SITE_OTHER): Payer: Medicare Other | Admitting: Family Medicine

## 2017-11-06 ENCOUNTER — Encounter: Payer: Self-pay | Admitting: Family Medicine

## 2017-11-06 VITALS — BP 126/68 | HR 81 | Temp 97.9°F | Wt 155.5 lb

## 2017-11-06 DIAGNOSIS — I10 Essential (primary) hypertension: Secondary | ICD-10-CM

## 2017-11-06 DIAGNOSIS — E1129 Type 2 diabetes mellitus with other diabetic kidney complication: Secondary | ICD-10-CM | POA: Diagnosis not present

## 2017-11-06 DIAGNOSIS — R600 Localized edema: Secondary | ICD-10-CM | POA: Diagnosis not present

## 2017-11-06 MED ORDER — GLUCOSE BLOOD VI STRP: ORAL_STRIP | 11 refills | Status: DC

## 2017-11-06 NOTE — Progress Notes (Signed)
Subjective:    Patient ID: Anita Mccormick, female    DOB: 10-30-1945, 72 y.o.   MRN: [id number]  Chief Complaint  Patient presents with  . Diabetes    HPI Patient was seen today for follow-up.  Pt was seen 4/10 for LE edema.  Pt was hypotensive 84/50 and 84/38.  BNP obtained, pt was hyponatremic (128) with slight increase in creatinine (1.2).    Repeat BMP on 4/11, with blood sugar 471.  Pt was given 5 units of Humalog in clinic.  Pt's bp improved.  Pt held blood pressure medications including atenolol.  There was some confusion whether or not patient was taking losartan and the dose.  Pt stated she was taking losartan 100 mg daily, atenolol 25 BID and Lasix 20 mg daily.  Pt has FSBS readings at various times: 272, 293, 244, 412, 340, 383, 290, 180.  Pt has been taking glipizide 10 mg and januvia 100 mg x yrs.  Pt states she does not have much of an appetite during the day.  Pt endorses a habit of eating peanut butter crackers at night, as in the past she had issues with hypoglycemia overnight while on another DM med.  Pt also had a "smal piece of molases cake".  LE edema is improving some. Pt has continued Lasix 20 mg daily.  She was previously told to continue the med daily until her edema resolved.  It has not.  Past Medical History:  Diagnosis Date  . Cancer [facility]    history of no adjunctive RX( no chemo no RT)  . Cerebrovascular accident ([place])   . COLONIC POLYPS, HX OF 12/10/2006   Qualifier: Diagnosis of  By: Leanne Chang MD, Bruce    . Diabetes mellitus    Type 2  . Diabetes mellitus type II, controlled ([place]) 12/10/2006   Poor control on Januvia 100mg  and glipizide 10mg  XL Lab Results  Component Value Date   HGBA1C 8.3* 11/02/2014       . Eczema   . Fatty liver disease, nonalcoholic 43/32/9518   Per Dr. Leanne Chang Lab Results  Component Value Date   ALT 31 11/02/2014   AST 57* 11/02/2014   ALKPHOS 104 11/02/2014   BILITOT 1.1 11/02/2014      . Hypertension     Allergies  Allergen  Reactions  . Ace Inhibitors Cough    ????  . Chlorthalidone     REACTION: unspecified  . Metformin     REACTION: gi side effects  . Penicillins     REACTION: urticaria (hives)  . Sulfamethoxazole     REACTION: questionable    ROS General: Denies fever, chills, night sweats, changes in weight, changes in appetite HEENT: Denies headaches, ear pain, changes in vision, rhinorrhea, sore throat CV: Denies CP, palpitations, SOB, orthopnea Pulm: Denies SOB, cough, wheezing GI: Denies abdominal pain, nausea, vomiting, diarrhea, constipation GU: Denies dysuria, hematuria, frequency, vaginal discharge Msk: Denies muscle cramps, joint pains  +LE edema, b/l Neuro: Denies weakness, numbness, tingling Skin: Denies rashes, bruising Psych: Denies depression, anxiety, hallucinations     Objective:    Blood pressure 126/68, pulse 81, temperature 97.9 F (36.6 C), temperature source Oral, weight 155 lb 8 oz (70.5 kg), SpO2 92 %.   Gen. Pleasant, well-nourished, in no distress, normal affect   HEENT: [place]/AT, face symmetric, no scleral icterus, PERRLA, nares patent without drainage Lungs: no accessory muscle use, CTAB Cardiovascular: RRR, trace edema at mid calf, 1 + at ankle. Neuro:  A&Ox3, CN II-XII  intact, normal gait Skin:  Warm, no lesions/ rash   Wt Readings from Last 3 Encounters:  11/06/17 155 lb 8 oz (70.5 kg)  11/04/17 158 lb 8 oz (71.9 kg)  10/05/17 165 lb (74.8 kg)    Lab Results  Component Value Date   WBC 6.7 09/30/2017   HGB 10.6 (L) 09/30/2017   HCT 33.4 (L) 09/30/2017   PLT 171.0 09/30/2017   GLUCOSE 473 (H) 11/05/2017   CHOL 146 03/18/2017   TRIG 154.0 (H) 03/18/2017   HDL 49.80 03/18/2017   LDLDIRECT 95.8 07/02/2006   LDLCALC 65 03/18/2017   ALT 25 09/19/2017   AST 68 (H) 09/19/2017   NA 134 (L) 11/05/2017   K 4.3 11/05/2017   CL 97 11/05/2017   CREATININE 1.07 11/05/2017   BUN 11 11/05/2017   CO2 28 11/05/2017   TSH 1.32 04/11/2014   INR 1.0 10/18/2007    HGBA1C 7.2 09/30/2017   MICROALBUR 0.2 04/11/2014    Assessment/Plan:  Type 2 diabetes mellitus with other diabetic kidney complication, without long-term current use of insulin (HCC) -Uncontrolled daily FSBS, though Hgb A1C was 7.2% on 09/30/17 -consider checking fructosamine  -Discussed the need for additional medication to help control daily blood sugars as pt on max dose of glipizide XL and Januvia for yrs.  Discussed likely addition of a GLP 1 agonist vs insulin. -Also stressed the importance of eating   - Plan: glucose blood test strip, Ambulatory referral to Endocrinology  Essential hypertension -controlled this visit.  126/68 -We will continue to have patient hold losartan 100 mg daily, and atenolol 100 mg twice daily. -Okay to continue taking Lasix 20 mg daily -We will likely need to restart 1 of the medications at a lower dose in the future. -Patient to continue checking blood pressure at home and keeping a log to take with her to each OFV.  Lower extremity edema -Improving -BNP on 11/04/17 was 82.  Potassium on 4/10 and 4/11 was 4 and 4.3 respectively. -Continue taking Lasix 20 mg daily -Patient has Cardiology appointment on 11/13/17. -continue elevating lower extremities -Patient did not tolerate TED hose in the past.  Follow-up in 1 month, sooner if needed  [name], MD

## 2017-11-12 NOTE — Progress Notes (Signed)
Cardiology Office Note   Date:  11/13/2017   ID:  Anita Mccormick, DOB 02/27/1946, MRN [id number]  PCP:  Billie Ruddy, MD  Cardiologist:  Dr. Martinique  Chief Complaint  Patient presents with  . Chest Pain    pt states that sometimes her heart feels like its going to beat out of her chest      History of Present Illness: Anita Mccormick is a 72 y.o. female who presents for evaluation of edema and low BP.  She has a history of hypertension, hyperlipidemia, ongoing tobacco abuse, diabetes, CVA, and breast cancer in remission.  She was admitted in February with sepsis. She was treated for gram-negative, enterobacteriaceae and E-Coli noted on blood cultures. Found to be resistant to ampicillin. On discharge  09/23/2017, the patient's Lasix was held, and placed on Ceftin 500 mg twice a day hospitalization. She was noted to have acute kidney injury on admission, but on discharge and was 0.98. PT was ordered for home, as outpatient deconditioning improvement was necessary.  When seen in March weight was up to 165. Recently noted increase in edema. Was started on lasix 20 mg every other day. BP was noted to be low and atenolol and losartan were held. Since then she notes significant improvement in edema. Weight is down to 150 lbs. She has been monitoring BP which has increased since above changes. It runs 115-726 systolic. HR has also increased into the 115 range. She denies any SOB or chest pain.    Past Medical History:  Diagnosis Date  . Cancer [facility])    history of no adjunctive RX( no chemo no RT)  . Cerebrovascular accident ([place])   . COLONIC POLYPS, HX OF 12/10/2006   Qualifier: Diagnosis of  By: Leanne Chang MD, Bruce    . Diabetes mellitus    Type 2  . Diabetes mellitus type II, controlled ([place]) 12/10/2006   Poor control on Januvia 190m and glipizide 126mXL Lab Results  Component Value Date   HGBA1C 8.3* 11/02/2014       . Eczema   . Fatty liver disease, nonalcoholic 1220/35/5974 Per Dr. SwLeanne ChangLab Results  Component Value Date   ALT 31 11/02/2014   AST 57* 11/02/2014   ALKPHOS 104 11/02/2014   BILITOT 1.1 11/02/2014      . Hypertension     Past Surgical History:  Procedure Laterality Date  . CATARACT EXTRACTION Left   . MASTECTOMY Left   . TUBAL LIGATION       Current Outpatient Medications  Medication Sig Dispense Refill  . aspirin 325 MG tablet Take 325 mg by mouth daily.      . Blood Glucose Monitoring Suppl (ONETOUCH ULTRALINK) w/Device KIT Test twice daily (onetouch ultra) 1 each 0  . Blood Pressure Monitoring (BLOOD PRESSURE CUFF) MISC Use daily to monitor blood pressue 1 each 0  . Calcium Carbonate-Vitamin D (CALCIUM PLUS VITAMIN D) 600-100 MG-UNIT CAPS Take by mouth 2 (two) times daily.      . furosemide (LASIX) 20 MG tablet Take 1 tablet (20 mg total) by mouth every other day. 30 tablet 11  . glipiZIDE (GLUCOTROL XL) 10 MG 24 hr tablet Take 1 tablet (10 mg total) by mouth 2 (two) times daily. 90 tablet 2  . glucose blood test strip Use as instructed.  For TID testing, more for hypo or hyperglycemia. 200 each 11  . JANUVIA 100 MG tablet TAKE 1 TABLET BY MOUTH EVERY DAY (Patient taking differently:  TAKE 1 TABLET (100 MG)  BY MOUTH EVERY DAY) 90 tablet 1  . atenolol (TENORMIN) 50 MG tablet Take 1 tablet (50 mg total) by mouth daily. 90 tablet 3  . losartan (COZAAR) 100 MG tablet Take 1 tablet (100 mg total) by mouth daily. (Patient not taking: Reported on 11/13/2017) 30 tablet 3   No current facility-administered medications for this visit.     Allergies:   Ace inhibitors; Chlorthalidone; Metformin; Penicillins; and Sulfamethoxazole    Social History:  The patient  reports that she has been smoking cigarettes.  She has been smoking about 0.10 packs per day. She has never used smokeless tobacco. She reports that she drinks alcohol. She reports that she does not use drugs.   Family History:  The patient's family history includes Cancer in her father; Pancreatic cancer  in her sister; Stroke in her mother.    ROS: All other systems are reviewed and negative. Unless otherwise mentioned in H&P    PHYSICAL EXAM: VS:  BP (!) 143/64   Pulse (!) 107   Ht 5' 2" (1.575 m)   Wt 150 lb 3.2 oz (68.1 kg)   SpO2 100%   BMI 27.47 kg/m  , BMI Body mass index is 27.47 kg/m. GENERAL:  Well appearing BF in NAD HEENT:  PERRL, EOMI, sclera are clear. Oropharynx is clear. NECK:  No jugular venous distention, carotid upstroke brisk and symmetric, no bruits, no thyromegaly or adenopathy LUNGS:  Clear to auscultation bilaterally CHEST:  Unremarkable HEART:  RRR,  PMI not displaced or sustained,S1 and S2 within normal limits, no S3, no S4: no clicks, no rubs, no murmurs ABD:  Soft, nontender. BS +, no masses or bruits. No hepatomegaly, no splenomegaly EXT:  2 + pulses throughout, 1+ edema, no cyanosis no clubbing SKIN:  Warm and dry.  No rashes NEURO:  Alert and oriented x 3. Cranial nerves II through XII intact. PSYCH:  Cognitively intact    Recent Labs: 09/19/2017: ALT 25; B Natriuretic Peptide 334.9 09/30/2017: Hemoglobin 10.6; Platelets 171.0 11/04/2017: Pro B Natriuretic peptide (BNP) 82.0 11/05/2017: BUN 11; Creatinine, Ser 1.07; Potassium 4.3; Sodium 134    Lipid Panel    Component Value Date/Time   CHOL 146 03/18/2017 0851   TRIG 154.0 (H) 03/18/2017 0851   TRIG 202 (HH) 07/02/2006 1110   HDL 49.80 03/18/2017 0851   CHOLHDL 3 03/18/2017 0851   VLDL 30.8 03/18/2017 0851   LDLCALC 65 03/18/2017 0851   LDLDIRECT 95.8 07/02/2006 1110      Wt Readings from Last 3 Encounters:  11/13/17 150 lb 3.2 oz (68.1 kg)  11/06/17 155 lb 8 oz (70.5 kg)  11/04/17 158 lb 8 oz (71.9 kg)    Other studies Reviewed:  Myoview 08/26/17: Study Highlights    The left ventricular ejection fraction is hyperdynamic (>65%).  Nuclear stress EF: 70%.  There was no ST segment deviation noted during stress.  No T wave inversion was noted during stress.  The study is  normal.  This is a low risk study.    Echocardiogram 08/11/2017 Left ventricle: The cavity size was normal. Wall thickness was   increased in a pattern of mild LVH. Systolic function was normal.   The estimated ejection fraction was in the range of 60% to 65%.   Wall motion was normal; there were no regional wall motion   abnormalities. Doppler parameters are consistent with abnormal   left ventricular relaxation (grade 1 diastolic dysfunction).   Doppler parameters are consistent  with high ventricular filling   pressure. - Mitral valve: Calcified annulus. - Left atrium: The atrium was mildly dilated.  Impressions:  - Normal LV systolic function; mild LVH; mild diastolic   dysfunction; mild LAE.  ASSESSMENT AND PLAN:  1. Hypertension: Blood pressure recordings are higher now that BP medications held for hypotension. Will renew Atenolol at lower dose of 50 mg daily. Monitor BP. If it remains high would resume losartan 25 mg daily. HR increased due to stopping beta blocker. Was on atenolol 100 mg bid which I think is too high a dose for her.   2. Chronic diastolic heart failure: I am not convinced all her swelling is due to CHF. Her edema has improved significantly with lasix qod. Continue current therapy.  3. Diabetes: per primary care.    I will follow up in 3 months.   Martinique MD, [facility]     11/13/2017 11:21 AM    [place]

## 2017-11-13 ENCOUNTER — Ambulatory Visit (INDEPENDENT_AMBULATORY_CARE_PROVIDER_SITE_OTHER): Payer: Medicare Other | Admitting: Cardiology

## 2017-11-13 ENCOUNTER — Encounter: Payer: Self-pay | Admitting: Cardiology

## 2017-11-13 VITALS — BP 143/64 | HR 107 | Ht 62.0 in | Wt 150.2 lb

## 2017-11-13 DIAGNOSIS — R600 Localized edema: Secondary | ICD-10-CM

## 2017-11-13 DIAGNOSIS — I5032 Chronic diastolic (congestive) heart failure: Secondary | ICD-10-CM | POA: Diagnosis not present

## 2017-11-13 DIAGNOSIS — I10 Essential (primary) hypertension: Secondary | ICD-10-CM

## 2017-11-13 MED ORDER — ATENOLOL 50 MG PO TABS: 50.0000 mg | ORAL_TABLET | Freq: Every day | ORAL | 3 refills | Status: DC

## 2017-11-13 MED ORDER — FUROSEMIDE 20 MG PO TABS: 20.0000 mg | ORAL_TABLET | ORAL | 11 refills | Status: DC

## 2017-11-13 NOTE — Patient Instructions (Signed)
Resume atenolol at 50 mg daily  Continue lasix 20 mg every other day  I will see you in 3 months

## 2017-11-15 ENCOUNTER — Other Ambulatory Visit: Payer: Self-pay | Admitting: Family Medicine

## 2017-11-15 DIAGNOSIS — R600 Localized edema: Secondary | ICD-10-CM

## 2017-11-16 ENCOUNTER — Encounter (HOSPITAL_COMMUNITY): Payer: Self-pay

## 2017-11-16 ENCOUNTER — Ambulatory Visit: Payer: Self-pay

## 2017-11-16 ENCOUNTER — Emergency Department (HOSPITAL_COMMUNITY)
Admission: EM | Admit: 2017-11-16 | Discharge: 2017-11-17 | Disposition: A | Discharge status: Home / Self Care | Payer: Medicare Other | Attending: Emergency Medicine | Admitting: Emergency Medicine

## 2017-11-16 DIAGNOSIS — Z7984 Long term (current) use of oral hypoglycemic drugs: Secondary | ICD-10-CM | POA: Diagnosis not present

## 2017-11-16 DIAGNOSIS — D649 Anemia, unspecified: Secondary | ICD-10-CM | POA: Insufficient documentation

## 2017-11-16 DIAGNOSIS — E1165 Type 2 diabetes mellitus with hyperglycemia: Secondary | ICD-10-CM | POA: Diagnosis not present

## 2017-11-16 DIAGNOSIS — I5032 Chronic diastolic (congestive) heart failure: Secondary | ICD-10-CM | POA: Diagnosis not present

## 2017-11-16 DIAGNOSIS — I130 Hypertensive heart and chronic kidney disease with heart failure and stage 1 through stage 4 chronic kidney disease, or unspecified chronic kidney disease: Secondary | ICD-10-CM | POA: Insufficient documentation

## 2017-11-16 DIAGNOSIS — F17210 Nicotine dependence, cigarettes, uncomplicated: Secondary | ICD-10-CM | POA: Insufficient documentation

## 2017-11-16 DIAGNOSIS — Z79899 Other long term (current) drug therapy: Secondary | ICD-10-CM | POA: Insufficient documentation

## 2017-11-16 DIAGNOSIS — Z7982 Long term (current) use of aspirin: Secondary | ICD-10-CM | POA: Diagnosis not present

## 2017-11-16 DIAGNOSIS — E1122 Type 2 diabetes mellitus with diabetic chronic kidney disease: Secondary | ICD-10-CM | POA: Insufficient documentation

## 2017-11-16 DIAGNOSIS — R739 Hyperglycemia, unspecified: Secondary | ICD-10-CM

## 2017-11-16 DIAGNOSIS — N182 Chronic kidney disease, stage 2 (mild): Secondary | ICD-10-CM | POA: Diagnosis not present

## 2017-11-16 DIAGNOSIS — Z853 Personal history of malignant neoplasm of breast: Secondary | ICD-10-CM | POA: Insufficient documentation

## 2017-11-16 DIAGNOSIS — R42 Dizziness and giddiness: Secondary | ICD-10-CM | POA: Diagnosis present

## 2017-11-16 LAB — CBG MONITORING, ED
GLUCOSE-CAPILLARY: 363 mg/dL — AB (ref 65–99)
GLUCOSE-CAPILLARY: 245 mg/dL — AB (ref 65–99)

## 2017-11-16 LAB — CBC
HEMATOCRIT: 25 % — AB (ref 36.0–46.0)
HEMOGLOBIN: 7.5 g/dL — AB (ref 12.0–15.0)
MCH: 28.6 pg (ref 26.0–34.0)
MCHC: 30 g/dL (ref 30.0–36.0)
MCV: 95.4 fL (ref 78.0–100.0)
Platelets: 125 10*3/uL — ABNORMAL LOW (ref 150–400)
RBC: 2.62 MIL/uL — AB (ref 3.87–5.11)
RDW: 16.2 % — ABNORMAL HIGH (ref 11.5–15.5)
WBC: 4.5 10*3/uL (ref 4.0–10.5)

## 2017-11-16 LAB — URINALYSIS, ROUTINE W REFLEX MICROSCOPIC
BACTERIA UA: NONE SEEN
BILIRUBIN URINE: NEGATIVE
Glucose, UA: >=500 mg/dL — AB
Hgb urine dipstick: NEGATIVE
KETONES UR: NEGATIVE mg/dL
LEUKOCYTES UA: NEGATIVE
NITRITE: NEGATIVE
PROTEIN: NEGATIVE mg/dL
SPECIFIC GRAVITY, URINE: 1.01 (ref 1.005–1.030)
pH: 7 (ref 5.0–8.0)

## 2017-11-16 LAB — BASIC METABOLIC PANEL
ANION GAP: 11 (ref 5–15)
BUN: 11 mg/dL (ref 6–20)
CO2: 23 mmol/L (ref 22–32)
Calcium: 9.5 mg/dL (ref 8.9–10.3)
Chloride: 101 mmol/L (ref 101–111)
Creatinine, Ser: 0.98 mg/dL (ref 0.44–1.00)
GFR, EST NON AFRICAN AMERICAN: 57 mL/min — AB (ref >60)
GLUCOSE: 351 mg/dL — AB (ref 65–99)
POTASSIUM: 4 mmol/L (ref 3.5–5.1)
Sodium: 135 mmol/L (ref 135–145)

## 2017-11-16 NOTE — ED Triage Notes (Addendum)
Pt reports glucose and blood pressure have been high at home over the last several days and she is experiencing dizziness at times. Pt reports she's been taking [place] as prescribed. Pt reports dry mouth and increased thirst.  Pt stated cbg has been 300-500 at home.

## 2017-11-16 NOTE — Progress Notes (Deleted)
Patient ID: Anita Mccormick                 DOB: 1946-04-14                      MRN: [id number]     HPI: Anita Mccormick is a 72 y.o. female referred to [place] by Jory Sims, NP. She is a patient of Dr. Martinique. PMH is significant for hypertension, chronic diastolic heart failure, hyperlipidemia, diabetes, breast cancer in remission, and CVA.  She presents today for blood pressure follow-up. Patient denies chest pain, SOB, and dizziness. However, she has noticed swelling of ankles/feet daily.   Current HTN meds:   Furosemide 20 mg every day  Losartan 19m daily  Atenolol 547mdaily  Previously tried:   Losartan 100 mg   Quinapril 20 mg  Micardis 80 mg   Telmisartan 80 mg   BP goal: 130/80 mmHg  Family History:   Mother had a history of a stroke and hypertension.  Father died from prostate cancer.  Sister has a history of pancreatic cancer.   Sister and brother have a history of high blood pressure.    Social History:   Tobacco: 2 cigarettes a day. Smoked for 12 years.    Alcohol: ~ 2 drinks/week (Mixed drinks)  Caffeine: Denies any caffeine use.  Diet:   Cooks most of meals. She eats out occasionally especially after church on Sundays.   Does not cook with salt generally, if she does she puts it in the food before it is cooked.   Eats vegetables mostly fresh (when they're in season). If vegetables aren't in season she  uses canned vegetables.   Exercise:   Patient does not exercise due to weakness in her legs.    Home BP readings:   Patient checks blood pressure twice daily at home.   She forgot to bring her readings today, but she reports  Her readings have been in the  170/60s mmHg.  Labs:   10/08/2017: Scr 0.89, Na 141, K 3.8  BNP 2/19: 334.9   DG Chest 2 view: No active cardiopulmonary disease  Echo 08/11/17: Normal LV systolic function, mild LVH, mild diastolic dysfunction, mild LAE    Wt Readings from Last 3 Encounters:  11/13/17 150 lb 3.2 oz (68.1 kg)    11/06/17 155 lb 8 oz (70.5 kg)  11/04/17 158 lb 8 oz (71.9 kg)   BP Readings from Last 3 Encounters:  11/13/17 (!) 143/64  11/06/17 126/68  11/04/17 (!) 84/50   Pulse Readings from Last 3 Encounters:  11/13/17 (!) 107  11/06/17 81  11/04/17 78   Past Medical History:  Diagnosis Date  [facility]   history of no adjunctive RX( no chemo no RT)  . [place]  . COLONIC POLYPS, HX OF 12/10/2006   Qualifier: Diagnosis of  By: SwLeanne ChangD, Bruce    . Diabetes mellitus    Type 2  . Diabetes [place]   Poor control on Januvia 10084mnd glipizide 39m5m Lab Results  Component Value Date   HGBA1C 8.3* 11/02/2014       . Eczema   . Fatty liver disease, nonalcoholic 12/174/25/9563er Dr. SworLeanne Chang Results  Component Value Date   ALT 31 11/02/2014   AST 57* 11/02/2014   ALKPHOS 104 11/02/2014   BILITOT 1.1 11/02/2014      . Hypertension  Current Outpatient Medications on File Prior to Visit  Medication Sig Dispense Refill  . aspirin 325 MG tablet Take 325 mg by mouth daily.      [name] atenolol (TENORMIN) 50 MG tablet Take 1 tablet (50 mg total) by mouth daily. 90 tablet 3  . Blood Glucose Monitoring Suppl (ONETOUCH ULTRALINK) w/Device KIT Test twice daily (onetouch ultra) 1 each 0  . Blood Pressure Monitoring (BLOOD PRESSURE CUFF) MISC Use daily to monitor blood pressue 1 each 0  . Calcium Carbonate-Vitamin D (CALCIUM PLUS VITAMIN D) 600-100 MG-UNIT CAPS Take by mouth 2 (two) times daily.      . furosemide (LASIX) 20 MG tablet Take 1 tablet (20 mg total) by mouth every other day. 30 tablet 11  . furosemide (LASIX) 20 MG tablet TAKE 1 TABLET BY MOUTH EVERY DAY 30 tablet 0  . glipiZIDE (GLUCOTROL XL) 10 MG 24 hr tablet Take 1 tablet (10 mg total) by mouth 2 (two) times daily. 90 tablet 2  . glucose blood test strip Use as instructed.  For TID testing, more for hypo or hyperglycemia. 200 each 11  . JANUVIA 100 MG tablet TAKE 1  TABLET BY MOUTH EVERY DAY (Patient taking differently: TAKE 1 TABLET (100 MG)  BY MOUTH EVERY DAY) 90 tablet 1  . losartan (COZAAR) 100 MG tablet Take 1 tablet (100 mg total) by mouth daily. (Patient not taking: Reported on 11/13/2017) 30 tablet 3   No current facility-administered medications on file prior to visit.     Allergies  Allergen Reactions  . Ace Inhibitors Cough    ????  . Chlorthalidone     REACTION: unspecified  . Metformin     REACTION: gi side effects  . Penicillins     REACTION: urticaria (hives)  . Sulfamethoxazole     REACTION: questionable    There were no vitals taken for this visit.  No problem-specific Assessment & Plan notes found for this encounter.   Obdulio Mash Rodriguez-Guzman PharmD, BCPS, [place] [id number] 11/16/2017 4:09 PM

## 2017-11-16 NOTE — Telephone Encounter (Signed)
Pt calling with blood sugars ranging 490- 550 for the past 5 days.  Per chart review she had blood glucose in the upper 473 on 11/05/17 Pt stated her usual range is 138. Pt takes glipizide and Januvia. Pt having frequent urination, increased SOB with exertion, mild dizziness, leg weakness and dry mouth. During assessment NT noted some confusion. Per protocol advised pt to go to ED. Called PCP office to notify and triage pt. Spoke with Hoyle Sauer who agrees with disposition.  Discussed with pt the ramifications of not seeking medical attention ASAP. Pt stated she will go to [facility] ED for medical treatment. Pt stated her husband will drive her. Reason for Disposition . Blood glucose > 500 mg/dl (27.5 mmol/l)  Answer Assessment - Initial Assessment Questions 1. BLOOD GLUCOSE: "What is your blood glucose level?"      1:27: 550  2:31 CBG 490 2. ONSET: "When did you check the blood glucose?"     1:27 (pt had just eaten) 2:31 (1 hour after eating) running 400-500's past  3. USUAL RANGE: "What is your glucose level usually?" (e.g., usual fasting morning value, usual evening value)  138 4. KETONES: "Do you check for ketones (urine or blood test strips)?" If yes, ask: "What does the test show now?"      no 5. TYPE 1 or 2:  "Do you know what type of diabetes you have?"  (e.g., Type 1, Type 2, Gestational; doesn't know)      Type 2 diabetes 6. INSULIN: "Do you take insulin?" If yes, ask: "Have you missed any shots recently?"     No-  7. DIABETES PILLS: "Do you take any pills for your diabetes?" If yes, ask: "Have you missed taking any pills recently?"     Takes glucophage and januvia 8. OTHER SYMPTOMS: "Do you have any symptoms?" (e.g., fever, frequent urination, difficulty breathing, dizziness, weakness, vomiting)     No fever, frequent urination, gets SOB with exertion, dizziness (mild), legs more than her baseline weakness, dry mouth 9. PREGNANCY: "Is there any chance you are pregnant?" "When was your  last menstrual period?"     n/a  Protocols used: DIABETES - HIGH BLOOD SUGAR-A-AH

## 2017-11-16 NOTE — Telephone Encounter (Signed)
Pt arrived at [facility] ED.

## 2017-11-16 NOTE — Telephone Encounter (Signed)
Will monitor for ED arrival.  

## 2017-11-17 ENCOUNTER — Ambulatory Visit: Payer: Medicare Other

## 2017-11-17 ENCOUNTER — Ambulatory Visit: Payer: Self-pay | Admitting: *Deleted

## 2017-11-17 ENCOUNTER — Telehealth: Payer: Self-pay | Admitting: Family Medicine

## 2017-11-17 LAB — CBC
HEMATOCRIT: 27.1 % — AB (ref 36.0–46.0)
Hemoglobin: 8.5 g/dL — ABNORMAL LOW (ref 12.0–15.0)
MCH: 29.9 pg (ref 26.0–34.0)
MCHC: 31.4 g/dL (ref 30.0–36.0)
MCV: 95.4 fL (ref 78.0–100.0)
PLATELETS: 130 10*3/uL — AB (ref 150–400)
RBC: 2.84 MIL/uL — ABNORMAL LOW (ref 3.87–5.11)
RDW: 16.1 % — AB (ref 11.5–15.5)
WBC: 5.7 10*3/uL (ref 4.0–10.5)

## 2017-11-17 LAB — POC OCCULT BLOOD, ED: Fecal Occult Bld: POSITIVE — AB

## 2017-11-17 LAB — PREPARE RBC (CROSSMATCH)

## 2017-11-17 MED ORDER — SODIUM CHLORIDE 0.9 % IV SOLN: 10.0000 mL/h | Freq: Once | INTRAVENOUS | Status: DC

## 2017-11-17 MED ORDER — OMEPRAZOLE 20 MG PO CPDR: 40.0000 mg | DELAYED_RELEASE_CAPSULE | Freq: Every day | ORAL | 0 refills | Status: DC

## 2017-11-17 MED ORDER — FERROUS SULFATE 325 (65 FE) MG PO TABS: 325.0000 mg | ORAL_TABLET | Freq: Every day | ORAL | 1 refills | Status: DC

## 2017-11-17 NOTE — Telephone Encounter (Signed)
Pt questioning if she should be taking ferrous sulfate recently RX for her as she has a sulfur allergy. Advised pt the sulfites and sulfur were not related but to check with pharmacist for further information.

## 2017-11-17 NOTE — ED Notes (Signed)
Resting quietly on stretcher - no complaints at this time - conversant

## 2017-11-17 NOTE — ED Notes (Signed)
Patient given discharge instructions and verbalized understanding.  Patient stable to discharge at this time.  Patient is alert and oriented to baseline.  No distressed noted at this time.  All belongings taken with the patient at discharge.   

## 2017-11-17 NOTE — Discharge Instructions (Addendum)
Your hemoglobin today was 7.5 on initial workup.  This is below your normal hemoglobin range of 10.5-12.  You did have evidence of blood on your stool exam. This could be from your known hemorrhoids or a bleeding ulcer. For this reason, we advise you to take Prilosec as prescribed until you see your primary care doctor. You may also benefit from taking daily iron tablets to prevent your hemoglobin from dropping further.  See a gastroenterologist as you need to schedule a colonoscopy in the near future.  Continue your diabetic medications as prescribed by your primary doctor.  Follow-up with your primary care doctor as soon as you are able to further discuss any potential changes to your current diabetes regimen.  Also continue your atenolol as prescribed by Dr. Martinique.  You may return to the emergency department for new or concerning symptoms.

## 2017-11-17 NOTE — ED Provider Notes (Signed)
[place] EMERGENCY DEPARTMENT Provider Note   CSN: [id number] Arrival date & time: 11/16/17  1601     History   Chief Complaint Chief Complaint  Patient presents with  . Hyperglycemia    HPI Anita Mccormick is a 72 y.o. female.  72 year old female with history of diabetes, hypertension, CVA presents to the emergency department over concern for hypertension and hyperglycemia.  She has been checking her blood sugar, specifically, over the past few days.  Her sugar reached as high as 550 prior to arrival.  She states that she has "never been right" since discharge from the hospital after a stay in February.  She has continued taking her glipizide as well as her Januvia and denies any changes to this diabetic regimen despite visits with her primary care doctor.  She states that she is unsure of why this regimen has not been changed.  She noticed increased dry mouth as well as polydipsia prior to arrival.  Patient also reporting an episode of dizziness with slight confusion.  She denies any of these symptoms at the present time.  She self-administered her Januvia prior to arrival.  The patient also has a history of hypotension.  She was taken off of her atenolol as well as her losartan following her hospital admission.  After her visit with her cardiologist recently, she was placed back on atenolol.  She has been compliant with this medication.  She denies any recent fevers or other viral illness.  No associated nausea or vomiting, hematemesis, hematochezia, increased bruising, syncope.  She has noted some darker stool over the past 2 days.     Past Medical History:  Diagnosis Date  . Cancer [facility])    history of no adjunctive RX( no chemo no RT)  . Cerebrovascular accident ([place])   . COLONIC POLYPS, HX OF 12/10/2006   Qualifier: Diagnosis of  By: Leanne Chang MD, Bruce    . Diabetes mellitus    Type 2  . Diabetes mellitus type II, controlled ([place]) 12/10/2006   Poor control on  Januvia 165m and glipizide 161mXL Lab Results  Component Value Date   HGBA1C 8.3* 11/02/2014       . Eczema   . Fatty liver disease, nonalcoholic 1209/98/3382 Per Dr. SwLeanne Changab Results  Component Value Date   ALT 31 11/02/2014   AST 57* 11/02/2014   ALKPHOS 104 11/02/2014   BILITOT 1.1 11/02/2014      . Hypertension     Patient Active Problem List   Diagnosis Date Noted  . Anemia   . Sepsis due to Escherichia coli (E. coli) ([facility]  . AKI (acute kidney injury) ([facility]  . Sepsis ([facility]02/23/2019  . UTI (urinary tract infection) 09/19/2017  . Chronic kidney disease (CKD), stage II (mild) 09/19/2017  . Leukopenia 09/19/2017  . Chronic diastolic CHF (congestive heart failure) ([facility]02/23/2019  . Statins contraindicated 06/12/2017  . Hepatotoxicity due to statin drug 06/12/2017  . History of adenomatous polyp of colon 03/18/2017  . Viral URI with cough 01/17/2017  . Thrombocytopenia ([facility]05/24/2017  . Insomnia 08/21/2015  . Smoker 05/21/2015  . Eczema 05/21/2015  . Fatty liver disease, nonalcoholic 1250/53/9767. Hyperlipidemia 11/03/2007  . History of CVA (cerebrovascular accident) 10/20/2007  . Type II diabetes mellitus with renal manifestations ([facility]05/15/2008  . Essential hypertension 12/10/2006  . BREAST CANCER, HX OF 12/10/2006    Past Surgical History:  Procedure Laterality Date  . CATARACT EXTRACTION Left   .  MASTECTOMY Left   . TUBAL LIGATION       OB History   None      Home Medications    Prior to Admission medications   Medication Sig Start Date End Date Taking? Authorizing Provider  aspirin 325 MG tablet Take 325 mg by mouth at bedtime.    Yes [provider]  atenolol (TENORMIN) 50 MG tablet Take 1 tablet (50 mg total) by mouth daily. Patient taking differently: Take 50 mg by mouth 2 (two) times daily.  11/13/17 11/08/18 Yes Martinique, Peter M, MD  Calcium Carbonate-Vitamin D (CALCIUM PLUS VITAMIN D) 600-100 MG-UNIT CAPS Take 1 capsule by mouth 2  (two) times daily.    Yes [provider]  furosemide (LASIX) 20 MG tablet Take 1 tablet (20 mg total) by mouth every other day. 11/13/17  Yes Martinique, Peter M, MD  glipiZIDE (GLUCOTROL XL) 10 MG 24 hr tablet Take 1 tablet (10 mg total) by mouth 2 (two) times daily. 09/16/17  Yes Marin Olp, MD  JANUVIA 100 MG tablet TAKE 1 TABLET BY MOUTH EVERY DAY Patient taking differently: TAKE 1 TABLET (100 MG)  BY MOUTH EVERY DAY 09/02/17  Yes Billie Ruddy, MD  Blood Glucose Monitoring Suppl (ONETOUCH ULTRALINK) w/Device KIT Test twice daily (onetouch ultra) Patient not taking: Reported on 11/16/2017 11/04/17   Billie Ruddy, MD  Blood Pressure Monitoring (BLOOD PRESSURE CUFF) MISC Use daily to monitor blood pressue Patient not taking: Reported on 11/16/2017 11/04/17   Billie Ruddy, MD  ferrous sulfate 325 (65 FE) MG tablet Take 1 tablet (325 mg total) by mouth daily. 11/17/17   Antonietta Breach, PA-C  furosemide (LASIX) 20 MG tablet TAKE 1 TABLET BY MOUTH EVERY DAY Patient not taking: Reported on 11/16/2017 11/16/17   Billie Ruddy, MD  glucose blood test strip Use as instructed.  For TID testing, more for hypo or hyperglycemia. Patient not taking: Reported on 11/16/2017 11/06/17   Billie Ruddy, MD  omeprazole (PRILOSEC) 20 MG capsule Take 2 capsules (40 mg total) by mouth daily. 11/17/17   Antonietta Breach, PA-C    Family History Family History  Problem Relation Age of Onset  . Stroke Mother   . Cancer Father        prostate  . Pancreatic cancer Sister     Social History Social History   Tobacco Use  . Smoking status: Current Every Day Smoker    Packs/day: 0.10    Types: Cigarettes  . Smokeless tobacco: Never Used  . Tobacco comment: 3 cigs a day :not smoking due to illness (01/17/17)  Substance Use Topics  . Alcohol use: Yes    Alcohol/week: 0.0 oz    Comment: a drink every now and again  . Drug use: No     Allergies   Ace inhibitors; Chlorthalidone; Metformin;  Penicillins; and Sulfamethoxazole   Review of Systems Review of Systems Ten systems reviewed and are negative for acute change, except as noted in the HPI.    Physical Exam Updated Vital Signs BP (!) 151/73   Pulse 76   Temp 98.5 F (36.9 C) (Oral)   Resp 16   SpO2 100%   Physical Exam  Constitutional: She is oriented to person, place, and time. She appears well-developed and well-nourished. No distress.  Nontoxic appearing and in NAD  HENT:  Head: Normocephalic and atraumatic.  Eyes: Conjunctivae and EOM are normal. No scleral icterus.  Neck: Normal range of motion.  Cardiovascular: Normal  rate, regular rhythm and intact distal pulses.  Pulmonary/Chest: Effort normal. No stridor. No respiratory distress. She has no wheezes. She has no rales.  Lungs CTAB  Abdominal: Soft. She exhibits no distension. There is no tenderness. There is no guarding.  Soft, nontender abdomen  Genitourinary:  Genitourinary Comments: Brown stool on DRE. Normal rectal tone. No melena or hematochezia noted.  Musculoskeletal: Normal range of motion.  Neurological: She is alert and oriented to person, place, and time. She exhibits normal muscle tone. Coordination normal.  GCS 15. Speech is goal oriented. No cranial nerve deficits appreciated; symmetric eyebrow raise, no facial drooping, tongue midline. Patient with 5/5 strength against resistance in all major muscle groups bilaterally. Patient moves extremities without ataxia.  Skin: Skin is warm and dry. No rash noted. She is not diaphoretic. No erythema. No pallor.  Psychiatric: She has a normal mood and affect. Her behavior is normal.  Nursing note and vitals reviewed.    ED Treatments / Results  Labs (all labs ordered are listed, but only abnormal results are displayed) Labs Reviewed  BASIC METABOLIC PANEL - Abnormal; Notable for the following components:      Result Value   Glucose, Bld 351 (*)    GFR calc non Af Amer 57 (*)    All other  components within normal limits  CBC - Abnormal; Notable for the following components:   RBC 2.62 (*)    Hemoglobin 7.5 (*)    HCT 25.0 (*)    RDW 16.2 (*)    Platelets 125 (*)    All other components within normal limits  URINALYSIS, ROUTINE W REFLEX MICROSCOPIC - Abnormal; Notable for the following components:   Color, Urine STRAW (*)    Glucose, UA >=500 (*)    Squamous Epithelial / LPF 0-5 (*)    All other components within normal limits  CBC - Abnormal; Notable for the following components:   RBC 2.84 (*)    Hemoglobin 8.5 (*)    HCT 27.1 (*)    RDW 16.1 (*)    Platelets 130 (*)    All other components within normal limits  CBG MONITORING, ED - Abnormal; Notable for the following components:   Glucose-Capillary 363 (*)    All other components within normal limits  CBG MONITORING, ED - Abnormal; Notable for the following components:   Glucose-Capillary 245 (*)    All other components within normal limits  POC OCCULT BLOOD, ED - Abnormal; Notable for the following components:   Fecal Occult Bld POSITIVE (*)    All other components within normal limits  TYPE AND SCREEN  PREPARE RBC (CROSSMATCH)    EKG None  Radiology No results found.  Procedures Procedures (including critical care time)  Medications Ordered in ED Medications  0.9 %  sodium chloride infusion (has no administration in time range)    CRITICAL CARE Performed by: Antonietta Breach   Total critical care time: 35 minutes  Critical care time was exclusive of separately billable procedures and treating other patients.  Critical care was necessary to treat or prevent imminent or life-threatening deterioration.  Critical care was time spent personally by me on the following activities: development of treatment plan with patient and/or surrogate as well as nursing, discussions with consultants, evaluation of patient's response to treatment, examination of patient, obtaining history from patient or surrogate,  ordering and performing treatments and interventions, ordering and review of laboratory studies, ordering and review of radiographic studies, pulse oximetry and re-evaluation of patient's  condition.   Initial Impression / Assessment and Plan / ED Course  I have reviewed the triage vital signs and the nursing notes.  Pertinent labs & imaging results that were available during my care of the patient were reviewed by me and considered in my medical decision making (see chart for details).  Clinical Course as of Nov 18 643  Tue Nov 17, 2017  0643 Hemoglobin(!): 8.5 [LB]    Clinical Course User Index [LB] Gwenyth Allegra    72 year old female presents to the emergency department over concerns for hyperglycemia.  She reports sugar of 550 at home.  In the emergency department, glucose 351.  No evidence of diabetic ketoacidosis.  Sugar continued to improve without intervention with repeat CBG of 245.  Patient reports taking her Januvia prior to coming to the emergency department.  Patient also expresses concern over hypertension.  She has been compliant with her atenolol as prescribed by her cardiologist, Dr. Martinique.  Her blood pressure today has been stable, most recently 151/73.  This is similar to prior outpatient readings.  I do not see indication for emergent management of the patient's hypertension.  No indication to deviate from current regimen.  On laboratory workup, hemoglobin 7.5.  This is down from the patient's baseline of 10.5-12.  She was transfused during her last hospital admission in February.  Anemia at this time was thought secondary to hemorrhoids.  She has a positive Hemoccult, but no evidence of melena or gross hematochezia on digital rectal exam.  She has no elevated BUN, tachycardia, hypotension to suggest concerning acute GI bleed.  Repeat hemoglobin is actually improved which may be secondary to hemoconcentration.  Plan to transfuse with 1 unit PRBCs.  Upon  completion of transfusion, I believe the patient is stable for discharge from the emergency department with instruction to follow-up with her primary care doctor.  Will start patient on omeprazole as a precaution and prescribed iron tablets.  Patient signed out to Dalia Heading, PA-C at change of shift pending transfusion completion.  Vitals:   11/17/17 0345 11/17/17 0400 11/17/17 0415 11/17/17 0530  BP:  (!) 141/55 (!) 149/59 (!) 151/73  Pulse: 77 77 79 76  Resp:      Temp:      TempSrc:      SpO2: 100% 100% 99% 100%    Final Clinical Impressions(s) / ED Diagnoses   Final diagnoses:  Anemia, unspecified type  Hyperglycemia    ED Discharge Orders        Ordered    ferrous sulfate 325 (65 FE) MG tablet  Daily     11/17/17 0636    omeprazole (PRILOSEC) 20 MG capsule  Daily     11/17/17 0639       Antonietta Breach, PA-C 11/17/17 3710    Rolland Porter, MD 11/17/17 [phone]

## 2017-11-17 NOTE — ED Provider Notes (Signed)
Patient states for the past 5 nights her blood sugars been high, up to 550.  She states she has had polydipsia and polyuria for the past 5 days.  She states she feels weak and lightheaded and sometimes she sees sparkles.  She has had an extremely mild headache the past 2 to 3 days that she states is diffuse and states it is a "1 out of 10.  She states it is just nagging.  She denies melena or gross blood in her bowel movements or when she wipes.  She denies any abdominal pain.  She says her gastroenterologist is Dr. Earlean Shawl.  She states however if everything is going on recently she feels like she is not able to get a colonoscopy done yet.  Patient is pleasant and cooperative.  She does appear to be a little pale with pale palms.  We discussed her anemia.  She is agreeable to getting a unit of blood in the ED and following up with her gastroenterologist.  Medical screening examination/treatment/procedure(s) were conducted as a shared visit with non-physician practitioner(s) and myself.  I personally evaluated the patient during the encounter.  None   Rolland Porter, MD, Barbette Or, MD 11/17/17 [phone]

## 2017-11-17 NOTE — Progress Notes (Signed)
Inpatient Diabetes Program Recommendations  AACE/ADA: New Consensus Statement on Inpatient Glycemic Control (2015)  Target Ranges:  Prepandial:   less than 140 mg/dL      Peak postprandial:   less than 180 mg/dL (1-2 hours)      Critically ill patients:  140 - 180 mg/dL   Lab Results  Component Value Date   GLUCAP 245 (H) 11/16/2017   HGBA1C 7.2 09/30/2017   Review of Glycemic Control  Diabetes history: DM 2 Outpatient Diabetes medications: Glipizide 10 mg BID, Januvia 100 mg Daily  Inpatient Diabetes Program Recommendations:    Last A1c on file 7.2% on 09/30/17  Patient reports elevated glucose levels. WHile here glucose in 300's. Patient also had a low hemoglobin. Patient will need to follow up 3 months after transfusion for updated HgbA1c level to determine glucose control. Can also still check her glucose at home and follow trends and call her PCP office for glucose consistently in the 200's or less than 70 mg/dl.  Thanks,  Tama Headings RN, MSN, BC-ADM, [facility] Inpatient Diabetes Coordinator Team Pager [phone] (8a-5p)

## 2017-11-18 LAB — TYPE AND SCREEN
ABO/RH(D): B POS
Antibody Screen: NEGATIVE
UNIT DIVISION: 0

## 2017-11-18 LAB — BPAM RBC
BLOOD PRODUCT EXPIRATION DATE: 201905132359
ISSUE DATE / TIME: 201904230744
Unit Type and Rh: 7300

## 2017-11-19 ENCOUNTER — Ambulatory Visit: Payer: Medicare Other | Admitting: Family Medicine

## 2017-11-19 ENCOUNTER — Ambulatory Visit: Payer: Self-pay

## 2017-11-19 ENCOUNTER — Encounter: Payer: Self-pay | Admitting: Family Medicine

## 2017-11-19 VITALS — BP 142/58 | HR 86 | Temp 99.2°F | Wt 152.7 lb

## 2017-11-19 DIAGNOSIS — R600 Localized edema: Secondary | ICD-10-CM | POA: Diagnosis not present

## 2017-11-19 DIAGNOSIS — E1129 Type 2 diabetes mellitus with other diabetic kidney complication: Secondary | ICD-10-CM

## 2017-11-19 DIAGNOSIS — I10 Essential (primary) hypertension: Secondary | ICD-10-CM

## 2017-11-19 DIAGNOSIS — D500 Iron deficiency anemia secondary to blood loss (chronic): Secondary | ICD-10-CM

## 2017-11-19 MED ORDER — DULAGLUTIDE 0.75 MG/0.5ML ~~LOC~~ SOAJ: 0.5000 mL | SUBCUTANEOUS | 11 refills | Status: DC

## 2017-11-19 NOTE — Progress Notes (Signed)
Subjective:    Patient ID: Anita Mccormick, female    DOB: 1946/02/04, 72 y.o.   MRN: [id number]  Chief Complaint  Patient presents with  . Diabetes    HPI Patient was seen today for for follow-up.  Pt states she recently went to the emergency department because her fingerstick blood sugar at home was in the 500s.  Pt endorses several weeks of increased blood sugars.  Pt has been taking glipizide XL 10 mg and Januvia 100 mg daily.  Pt endorses increased urination and feeling tired.  Patient endorses improvement in her lower extremity edema.  Patient states her legs are almost back to normal.  Patient endorses some burning/stinging in her legs.  While at the ED, pt was also noted to be anemic, hgb 8.5.  Pt was hemoccult positive.  She was transfused 1 u pRBCs and advised to f/u with GI, Dr. Earlean Shawl.  Pt states she is taking ferrous sulfate 325 mg daily.  Patient notes slightly dark as starting the iron, denies current patient.  Patient states she knows she wishes to wait until she is feeling better overall.  Patient denies seeing any gross blood in stools.  Past Medical History:  Diagnosis Date  . Cancer [facility])    history of no adjunctive RX( no chemo no RT)  . Cerebrovascular accident ([place])   . COLONIC POLYPS, HX OF 12/10/2006   Qualifier: Diagnosis of  By: Leanne Chang MD, Bruce    . Diabetes mellitus    Type 2  . Diabetes mellitus type II, controlled ([place]) 12/10/2006   Poor control on Januvia 100mg  and glipizide 10mg  XL Lab Results  Component Value Date   HGBA1C 8.3* 11/02/2014       . Eczema   . Fatty liver disease, nonalcoholic 46/65/9935   Per Dr. Leanne Chang Lab Results  Component Value Date   ALT 31 11/02/2014   AST 57* 11/02/2014   ALKPHOS 104 11/02/2014   BILITOT 1.1 11/02/2014      . Hypertension     Allergies  Allergen Reactions  . Ace Inhibitors Cough    ????  . Chlorthalidone     REACTION: unspecified  . Metformin     REACTION: gi side effects  . Penicillins     REACTION:  urticaria (hives)  . Sulfamethoxazole     REACTION: questionable    ROS General: Denies fever, chills, night sweats, changes in weight, changes in appetite   +feeling tired HEENT: Denies headaches, ear pain, changes in vision, rhinorrhea, sore throat CV: Denies CP, palpitations, SOB, orthopnea Pulm: Denies SOB, cough, wheezing GI: Denies abdominal pain, nausea, vomiting, diarrhea, constipation GU: Denies dysuria, hematuria, vaginal discharge  +increased urination Msk: Denies muscle cramps, joint pains Neuro: Denies weakness, numbness, tingling Skin: Denies rashes, bruising Psych: Denies depression, anxiety, hallucinations     Objective:    Blood pressure (!) 142/58, pulse 86, temperature 99.2 F (37.3 C), temperature source Oral, weight 152 lb 11.2 oz (69.3 kg), SpO2 96 %.   Gen. Pleasant, well-nourished, in no distress, normal affect   HEENT: [place]/AT, face symmetric, no scleral icterus, PERRLA,  nares patent without drainage Lungs: no accessory muscle use, CTAB, no wheezes or rales Cardiovascular: RRR, no m/r/g, trace peripheral edema Neuro:  A&Ox3, CN II-XII intact, normal gait Skin:  Warm, no lesions/ rash   Wt Readings from Last 3 Encounters:  11/19/17 152 lb 11.2 oz (69.3 kg)  11/13/17 150 lb 3.2 oz (68.1 kg)  11/06/17 155 lb 8 oz (70.5 kg)  Lab Results  Component Value Date   WBC 5.7 11/17/2017   HGB 8.5 (L) 11/17/2017   HCT 27.1 (L) 11/17/2017   PLT 130 (L) 11/17/2017   GLUCOSE 351 (H) 11/16/2017   CHOL 146 03/18/2017   TRIG 154.0 (H) 03/18/2017   HDL 49.80 03/18/2017   LDLDIRECT 95.8 07/02/2006   LDLCALC 65 03/18/2017   ALT 25 09/19/2017   AST 68 (H) 09/19/2017   NA 135 11/16/2017   K 4.0 11/16/2017   CL 101 11/16/2017   CREATININE 0.98 11/16/2017   BUN 11 11/16/2017   CO2 23 11/16/2017   TSH 1.32 04/11/2014   INR 1.0 10/18/2007   HGBA1C 7.2 09/30/2017   MICROALBUR 0.2 04/11/2014    Assessment/Plan:  Type 2 diabetes mellitus with other  diabetic kidney complication, without long-term current use of insulin ([place])  -Patient to continue taking glipizide XL 10 mg and Januvia 100 mg daily.   -We will add Trulicity .75 mg weekly.  Patient given samples.  One pen given today one clinic.  Pt to hold off on picking up rx from pharmacy as she has 1 pen for next wks use. -lifestyle modifications encouraged. -Patient to follow-up next week and continue checking FSBS at home to see if there is any improvement in her blood sugars. - Plan: Dulaglutide (TRULICITY) 9.48 NI/6.2VO SOPN  Iron deficiency anemia -Continue ferrous sulfate 325 mg daily -Patient seen dark stool and have issues with constipation from the medication.  HTN -stable -no episodes of hypotension -continue atenolol 50 mg  -continue f/u with cardiology prn  LE edema -improved.  Trace edema on exam b/l -pt encouraged to decrease sodium intake, elevate LEs when sitting.  Did not tolerate TED hose in the past. -continue lasix 20 mg qod  Follow-up next week for DM  [name], MD

## 2017-11-19 NOTE — Telephone Encounter (Signed)
Pt calling with elevated blood sugars. Last blood sugar taken was 0022 last night and was 353. Pt CBG's are usually 400-500. Pt is on Glipizide and Januvia. Pt having frequent urination and weakness.  Appt previously made by agent to today at 2:30 pm. Care advice given.  Reason for Disposition . [1] Blood glucose > 300 mg/dl (16.5 mmol/l) AND [2] two or more times in a row  Additional Information . Commented on: Blood glucose > 400 mg/dl (22 mmol/l)    Pt's CBG's  400-500's  Answer Assessment - Initial Assessment Questions 1. BLOOD GLUCOSE: "What is your blood glucose level?"  11/19/17 0022: 353 2. ONSET: "When did you check the blood glucose?"     0022 this am  3. USUAL RANGE: "What is your glucose level usually?" (e.g., usual fasting morning value, usual evening value)     400-500's 4. KETONES: "Do you check for ketones (urine or blood test strips)?" If yes, ask: "What does the test show now?"      no 5. TYPE 1 or 2:  "Do you know what type of diabetes you have?"  (e.g., Type 1, Type 2, Gestational; doesn't know)      Type 2  6. INSULIN: "Do you take insulin?" If yes, ask: "Have you missed any shots recently?"     Glipizide an Januvia 7. DIABETES PILLS: "Do you take any pills for your diabetes?" If yes, ask: "Have you missed taking any pills recently? Glipizide 8. OTHER SYMPTOMS: "Do you have any symptoms?" (e.g., fever, frequent urination, difficulty breathing, dizziness, weakness, vomiting)     Frequent urination, weakness in legs 9. PREGNANCY: "Is there any chance you are pregnant?" "When was your last menstrual period?"     n/a  Protocols used: DIABETES - HIGH BLOOD SUGAR-A-AH

## 2017-11-26 ENCOUNTER — Encounter: Payer: Self-pay | Admitting: Family Medicine

## 2017-11-26 ENCOUNTER — Ambulatory Visit: Payer: Medicare Other | Admitting: Family Medicine

## 2017-11-26 VITALS — BP 150/62 | HR 88 | Temp 99.0°F | Wt 151.0 lb

## 2017-11-26 DIAGNOSIS — E1129 Type 2 diabetes mellitus with other diabetic kidney complication: Secondary | ICD-10-CM

## 2017-11-26 NOTE — Patient Instructions (Signed)
Next Thursday we will have you come back for a nurse's visit so that you can give yourself the Trulicity injection in clinic.  This way if you have any questions about how to give yourself the injection we can go over it with you.  This will also give [place] a chance to see if your blood sugars continue to improve on this dose of the Trulicity.  When you come back to clinic next week do not forget to bring your Trulicity pen with you.  You should also bring your blood sugar log.

## 2017-11-26 NOTE — Progress Notes (Signed)
Subjective:    Patient ID: Anita Mccormick, female    DOB: [birth year], 72 y.o.   MRN: [id number]  No chief complaint on file.   HPI Patient was seen today for f/u on DM.  Pt was seen last week, started on Trulicity for continue hyperglycemia.  Pt is also taking Januvia 100 mg in the evening and Glipizide 10 mg in am.  Pt states she has been feeling better since starting Trulicity.  Pt's fsbs have improved, readings have been 311, 227, 205, 275, 400,260.  Pt checks fsbs BID.  Pt did not take her injection of trulicity yesterday as she states she was afraid she did not remember what to do.  Pt forgot to bring her pen with her this visit.  Patient is intolerant to metformin.  Past Medical History:  Diagnosis Date  . Cancer [facility])    history of no adjunctive RX( no chemo no RT)  . Cerebrovascular accident ([place])   . COLONIC POLYPS, HX OF 12/10/2006   Qualifier: Diagnosis of  By: Leanne Chang MD, Bruce    . Diabetes mellitus    Type 2  . Diabetes mellitus type II, controlled ([place]) 12/10/2006   Poor control on Januvia 100mg  and glipizide 10mg  XL Lab Results  Component Value Date   HGBA1C 8.3* 11/02/2014       . Eczema   . Fatty liver disease, nonalcoholic 53/29/9242   Per Dr. Leanne Chang Lab Results  Component Value Date   ALT 31 11/02/2014   AST 57* 11/02/2014   ALKPHOS 104 11/02/2014   BILITOT 1.1 11/02/2014      . Hypertension     Allergies  Allergen Reactions  . Ace Inhibitors Cough    ????  . Chlorthalidone     REACTION: unspecified  . Metformin     REACTION: gi side effects  . Penicillins     REACTION: urticaria (hives)  . Sulfamethoxazole     REACTION: questionable    ROS General: Denies fever, chills, night sweats, changes in weight, changes in appetite HEENT: Denies headaches, ear pain, changes in vision, rhinorrhea, sore throat CV: Denies CP, palpitations, SOB, orthopnea Pulm: Denies SOB, cough, wheezing GI: Denies abdominal pain, nausea, vomiting, diarrhea, constipation GU: Denies  dysuria, hematuria, frequency, vaginal discharge Msk: Denies muscle cramps, joint pains Neuro: Denies weakness, numbness, tingling Skin: Denies rashes, bruising Psych: Denies depression, anxiety, hallucinations     Objective:    Blood pressure (!) 150/62, pulse 88, temperature 99 F (37.2 C), temperature source Oral, weight 151 lb (68.5 kg), SpO2 98 %.   Gen. Pleasant, well-nourished, in no distress, normal affect   HEENT: [place]/AT, face symmetric, no scleral icterus, PERRLA, nares patent without drainage Lungs: no accessory muscle use Cardiovascular: RRR, no peripheral edema Neuro:  A&Ox3, CN II-XII intact, normal gait Skin:  Warm, no lesions/ rash   Wt Readings from Last 3 Encounters:  11/26/17 151 lb (68.5 kg)  11/19/17 152 lb 11.2 oz (69.3 kg)  11/13/17 150 lb 3.2 oz (68.1 kg)    Lab Results  Component Value Date   WBC 5.7 11/17/2017   HGB 8.5 (L) 11/17/2017   HCT 27.1 (L) 11/17/2017   PLT 130 (L) 11/17/2017   GLUCOSE 351 (H) 11/16/2017   CHOL 146 03/18/2017   TRIG 154.0 (H) 03/18/2017   HDL 49.80 03/18/2017   LDLDIRECT 95.8 07/02/2006   LDLCALC 65 03/18/2017   ALT 25 09/19/2017   AST 68 (H) 09/19/2017   NA 135 11/16/2017   K 4.0  11/16/2017   CL 101 11/16/2017   CREATININE 0.98 11/16/2017   BUN 11 11/16/2017   CO2 23 11/16/2017   TSH 1.32 04/11/2014   INR 1.0 10/18/2007   HGBA1C 7.2 09/30/2017   MICROALBUR 0.2 04/11/2014    Assessment/Plan:  Type 2 diabetes mellitus with other diabetic kidney complication, without long-term current use of insulin (HCC) -Continue glipizide 10 mg, Januvia 100 mg daily. -Continue Trulicity 9.03 mg per 0.5 mL weekly -We will see how patient's blood sugars are next week. -Patient to return to clinic for nurse's visit in 1 week.  We will review patient's blood sugar log.  If they remain elevated we will increase the dose of Trulicity.  At that time patient will give herself the Trulicity injection/review proper use. -Patient  given RTC or ED precautions  [name], MD

## 2017-11-27 ENCOUNTER — Telehealth: Payer: Self-pay | Admitting: *Deleted

## 2017-11-27 NOTE — Telephone Encounter (Signed)
Copied from [place] [id number]. Topic: Inquiry >> Nov 26, 2017  2:28 PM Oliver Pila B wrote: Reason for CRM: pt called to let pcp know that she was suppose to let her know that she is suppose to have a dentist pull her tooth out, but since her bp and blood sugar is out of whack she needed to run this by her pcp before the procedure was done, call pt to advise

## 2017-11-27 NOTE — Telephone Encounter (Signed)
Please advise 

## 2017-12-01 NOTE — Telephone Encounter (Signed)
Pt has an appt in a few days.  We will see how her bs is at that time.  Her bp has been stable per last few visits if I remember correctly.

## 2017-12-02 NOTE — Telephone Encounter (Signed)
Pt notified of results/instructions and verbalized understanding. She will bring CBG log to next appointment.

## 2017-12-03 ENCOUNTER — Ambulatory Visit (INDEPENDENT_AMBULATORY_CARE_PROVIDER_SITE_OTHER): Payer: Medicare Other | Admitting: Family Medicine

## 2017-12-03 DIAGNOSIS — E1129 Type 2 diabetes mellitus with other diabetic kidney complication: Secondary | ICD-10-CM | POA: Diagnosis not present

## 2017-12-03 NOTE — Progress Notes (Signed)
Per Dr. Volanda Napoleon patient here today for Trulicity injection trainng. Patient demonstrated that she can safely give herself the injection. She cleaned area properly, knows the correct site, correct dose will rotate sites, injection 1 X a week-same day and time each week.

## 2017-12-07 ENCOUNTER — Ambulatory Visit: Payer: Medicare Other | Admitting: Family Medicine

## 2017-12-15 ENCOUNTER — Encounter: Payer: Self-pay | Admitting: Endocrinology

## 2017-12-15 ENCOUNTER — Ambulatory Visit (INDEPENDENT_AMBULATORY_CARE_PROVIDER_SITE_OTHER): Payer: Medicare Other | Admitting: Endocrinology

## 2017-12-15 VITALS — BP 134/64 | HR 89 | Wt 145.4 lb

## 2017-12-15 DIAGNOSIS — E1129 Type 2 diabetes mellitus with other diabetic kidney complication: Secondary | ICD-10-CM

## 2017-12-15 LAB — POCT GLYCOSYLATED HEMOGLOBIN (HGB A1C): HEMOGLOBIN A1C: 6.6 % — AB (ref 4.0–5.6)

## 2017-12-15 MED ORDER — DULAGLUTIDE 1.5 MG/0.5ML ~~LOC~~ SOAJ: 1.5000 mg | SUBCUTANEOUS | 11 refills | Status: DC

## 2017-12-15 MED ORDER — GLIPIZIDE ER 10 MG PO TB24: 10.0000 mg | ORAL_TABLET | ORAL | 2 refills | Status: DC

## 2017-12-15 NOTE — Progress Notes (Signed)
Subjective:    Patient ID: Anita Mccormick, female    DOB: 15-May-1946, 72 y.o.   MRN: [id number]  HPI pt is referred by Dr Volanda Napoleon, for diabetes.  Pt states DM was dx'ed in 1994; she has mild neuropathy of the lower extremities, and assoc pain; she has also had CVA; she has never been on insulin; pt says her diet and exercise are fair; she has never had GDM, pancreatitis, pancreatic surgery, severe hypoglycemia or DKA.  He takes trulicty and 2 oral meds.  No recent steroids.  she brings a record of her cbg's which I have reviewed today.  It varies from 58-292.  It is in general higher as the day goes on.   Past Medical History:  Diagnosis Date  . Cancer [facility])    history of no adjunctive RX( no chemo no RT)  . Cerebrovascular accident ([place])   . COLONIC POLYPS, HX OF 12/10/2006   Qualifier: Diagnosis of  By: Leanne Chang MD, Bruce    . Diabetes mellitus    Type 2  . Diabetes mellitus type II, controlled ([place]) 12/10/2006   Poor control on Januvia 131m and glipizide 153mXL Lab Results  Component Value Date   HGBA1C 8.3* 11/02/2014       . Eczema   . Fatty liver disease, nonalcoholic 1241/32/4401 Per Dr. SwLeanne Changab Results  Component Value Date   ALT 31 11/02/2014   AST 57* 11/02/2014   ALKPHOS 104 11/02/2014   BILITOT 1.1 11/02/2014      . Hypertension     Past Surgical History:  Procedure Laterality Date  . CATARACT EXTRACTION Left   . MASTECTOMY Left   . TUBAL LIGATION      Social History   Socioeconomic History  . Marital status: Married    Spouse name: Not on file  . Number of children: Not on file  . Years of education: Not on file  . Highest education level: Not on file  Occupational History  . Not on file  Social Needs  . Financial resource strain: Not on file  . Food insecurity:    Worry: Not on file    Inability: Not on file  . Transportation needs:    Medical: Not on file    Non-medical: Not on file  Tobacco Use  . Smoking status: Current Every Day Smoker    Packs/day:  0.10    Types: Cigarettes  . Smokeless tobacco: Never Used  . Tobacco comment: 3 cigs a day :not smoking due to illness (01/17/17)  Substance and Sexual Activity  . Alcohol use: Yes    Alcohol/week: 0.0 oz    Comment: a drink every now and again  . Drug use: No  . Sexual activity: Not on file  Lifestyle  . Physical activity:    Days per week: Not on file    Minutes per session: Not on file  . Stress: Not on file  Relationships  . Social connections:    Talks on phone: Not on file    Gets together: Not on file    Attends religious service: Not on file    Active member of club or organization: Not on file    Attends meetings of clubs or organizations: Not on file    Relationship status: Not on file  . Intimate partner violence:    Fear of current or ex partner: Not on file    Emotionally abused: Not on file    Physically abused: Not  on file    Forced sexual activity: Not on file  Other Topics Concern  . Not on file  Social History Narrative   Married (husband Lynann Bologna). 4 children. 11 grandkids. 1 greatgrandchild.       Still working as homemaker      Hobbies: playing cards, board games, cooking, entertaning    Current Outpatient Medications on File Prior to Visit  Medication Sig Dispense Refill  . aspirin 325 MG tablet Take 325 mg by mouth at bedtime.     [name] atenolol (TENORMIN) 50 MG tablet Take 1 tablet (50 mg total) by mouth daily. (Patient taking differently: Take 50 mg by mouth 2 (two) times daily. ) 90 tablet 3  . Blood Glucose Monitoring Suppl (ONETOUCH ULTRALINK) w/Device KIT Test twice daily (onetouch ultra) 1 each 0  . Blood Pressure Monitoring (BLOOD PRESSURE CUFF) MISC Use daily to monitor blood pressue 1 each 0  . Calcium Carbonate-Vitamin D (CALCIUM PLUS VITAMIN D) 600-100 MG-UNIT CAPS Take 1 capsule by mouth 2 (two) times daily.     . furosemide (LASIX) 20 MG tablet Take 1 tablet (20 mg total) by mouth every other day. 30 tablet 11  . glucose blood test strip Use  as instructed.  For TID testing, more for hypo or hyperglycemia. 200 each 11  . omeprazole (PRILOSEC) 20 MG capsule Take 2 capsules (40 mg total) by mouth daily. 60 capsule 0  . ferrous sulfate 325 (65 FE) MG tablet Take 1 tablet (325 mg total) by mouth daily. (Patient not taking: Reported on 12/15/2017) 15 tablet 1   No current facility-administered medications on file prior to visit.     Allergies  Allergen Reactions  . Ace Inhibitors Cough    ????  . Chlorthalidone     REACTION: unspecified  . Metformin     REACTION: gi side effects  . Penicillins     REACTION: urticaria (hives)  . Sulfamethoxazole     REACTION: questionable    Family History  Problem Relation Age of Onset  . Stroke Mother   . Cancer Father        prostate  . Diabetes Sister   . Pancreatic cancer Sister     BP 134/64   Pulse 89   Wt 145 lb 6.4 oz (66 kg)   SpO2 95%   BMI 26.59 kg/m   Review of Systems denies weight loss, diplopia, headache, chest pain, n/v, urinary frequency, muscle cramps, excessive diaphoresis, memory loss, depression, and rhinorrhea.  She has chronic doe, easy bruising, and cold intolerance.     Objective:   Physical Exam VS: see vs page GEN: no distress HEAD: head: no deformity eyes: no periorbital swelling, no proptosis external nose and ears are normal mouth: no lesion seen NECK: supple, thyroid is not enlarged CHEST WALL: no deformity LUNGS: clear to auscultation CV: reg rate and rhythm, no murmur ABD: abdomen is soft, nontender.  no hepatosplenomegaly.  not distended.  no hernia MUSCULOSKELETAL: muscle bulk and strength are grossly normal.  no obvious joint swelling.  gait is normal and steady EXTEMITIES: no deformity.  no ulcer on the feet.  feet are of normal color and temp.  no edema PULSES: dorsalis pedis intact bilat.  no carotid bruit NEURO:  cn 2-12 grossly intact.   readily moves all 4's.  sensation is intact to touch on the feet.  SKIN:  Normal texture and  temperature.  No rash or suspicious lesion is visible.   NODES:  None palpable at  the neck.  PSYCH: alert, well-oriented.  Does not appear anxious nor depressed.    Lab Results  Component Value Date   CREATININE 0.98 11/16/2017   BUN 11 11/16/2017   NA 135 11/16/2017   K 4.0 11/16/2017   CL 101 11/16/2017   CO2 23 11/16/2017   Lab Results  Component Value Date   HGBA1C 6.6 (A) 12/15/2017   I have reviewed outside records, and summarized: Pt was noted to have elevated a1c, and referred here.  She was seen in ER last month, with glucose of 550, but pt says no cause was found.  She had a blood transfusion then.      Assessment & Plan:  Type 2 DM, with polyneuropathy: glycemic control is apparently improved. Anemia.  Transfusions may be lowering a1c. Hypoglycemia: we'll de-emphasize SU rx.  Patient Instructions  good diet and exercise significantly improve the control of your diabetes.  please let me know if you wish to be referred to a dietician.  high blood sugar is very risky to your health.  you should see an eye doctor and dentist every year.  It is very important to get all recommended vaccinations.  Controlling your blood pressure and cholesterol drastically reduces the damage diabetes does to your body.  Those who smoke should quit.  Please discuss these with your doctor.  check your blood sugar once a day.  vary the time of day when you check, between before the 3 meals, and at bedtime.  also check if you have symptoms of your blood sugar being too high or too low.  please keep a record of the readings and bring it to your next appointment here (or you can bring the meter itself).  You can write it on any piece of paper.  please call us sooner if your blood sugar goes below 70, or if you have a lot of readings over 200. We will need to take this complex situation in stages For now, please: Reduce the glipizide to 10 mg each morning, and: stop taking the [place], and: I have  sent a prescription to your pharmacy, to double the trulicity.   Please come back for a follow-up appointment in 2 months.

## 2017-12-15 NOTE — Patient Instructions (Addendum)
good diet and exercise significantly improve the control of your diabetes.  please let me know if you wish to be referred to a dietician.  high blood sugar is very risky to your health.  you should see an eye doctor and dentist every year.  It is very important to get all recommended vaccinations.  Controlling your blood pressure and cholesterol drastically reduces the damage diabetes does to your body.  Those who smoke should quit.  Please discuss these with your doctor.  check your blood sugar once a day.  vary the time of day when you check, between before the 3 meals, and at bedtime.  also check if you have symptoms of your blood sugar being too high or too low.  please keep a record of the readings and bring it to your next appointment here (or you can bring the meter itself).  You can write it on any piece of paper.  please call us sooner if your blood sugar goes below 70, or if you have a lot of readings over 200. We will need to take this complex situation in stages For now, please: Reduce the glipizide to 10 mg each morning, and: stop taking the [place], and: I have sent a prescription to your pharmacy, to double the trulicity.   Please come back for a follow-up appointment in 2 months.

## 2017-12-23 ENCOUNTER — Ambulatory Visit: Payer: Medicare Other | Admitting: Podiatry

## 2017-12-23 ENCOUNTER — Encounter: Payer: Self-pay | Admitting: Podiatry

## 2017-12-23 DIAGNOSIS — E119 Type 2 diabetes mellitus without complications: Secondary | ICD-10-CM | POA: Diagnosis not present

## 2017-12-23 DIAGNOSIS — B351 Tinea unguium: Secondary | ICD-10-CM | POA: Diagnosis not present

## 2017-12-23 DIAGNOSIS — M79675 Pain in left toe(s): Secondary | ICD-10-CM

## 2017-12-23 DIAGNOSIS — M79674 Pain in right toe(s): Secondary | ICD-10-CM

## 2017-12-23 NOTE — Progress Notes (Signed)
Complaint:  Visit Type: Patient returns to my office for continued preventative foot care services. Complaint: Patient states" my nails have grown long and thick and become painful to walk and wear shoes" Patient has been diagnosed with DM with no foot complications. The patient presents for preventative foot care services. Patient states that she has  corns noted on the fifth toes of both feet have improved. She presents to the office for preventative foot care services.  Podiatric Exam: Vascular: dorsalis pedis and posterior tibial pulses are palpable bilateral. Capillary return is immediate. Temperature gradient is WNL. Skin turgor WNL  Sensorium: Normal Semmes Weinstein monofilament test. Normal tactile sensation bilaterally. Nail Exam: Pt has thick disfigured discolored nails with subungual debris noted bilateral entire nail hallux through fifth toenails Ulcer Exam: There is no evidence of ulcer or pre-ulcerative changes or infection. Orthopedic Exam: Muscle tone and strength are WNL. No limitations in general ROM. No crepitus or effusions noted. Adducto-varus fifth digits  both fifth toes. Skin: No Porokeratosis. No infection or ulcers.  Lister corn  B/L  Diagnosis:  Onychomycosis, , Pain in right toe, pain in left toes,    Treatment & Plan Procedures and Treatment: Consent by patient was obtained for treatment procedures.   Debridement of mycotic and hypertrophic toenails, 1 through 5 bilateral and clearing of subungual debris. No ulceration, no infection noted. Return Visit-Office Procedure: Patient instructed to return to the office for a follow up visit 3 months for continued evaluation and treatment.    Gardiner Barefoot DPM

## 2018-01-02 NOTE — Progress Notes (Deleted)
Cardiology Office Note   Date:  01/02/2018   ID:  Anita Mccormick, DOB Anita Mccormick, MRN [id number]  PCP:  Anita Ruddy, Mccormick  Cardiologist:  Anita Mccormick  No chief complaint on file.    History of Present Illness: Anita Mccormick is a 72 y.o. female who presents for evaluation of edema and low BP.  She has a history of hypertension, hyperlipidemia, ongoing tobacco abuse, diabetes, CVA, and breast cancer in remission.  She was evaluated in January 2019 with Echo and Myoview studies which were normal.  She was admitted in February with sepsis. She was treated for gram-negative, enterobacteriaceae and E-Coli noted on blood cultures. Found to be resistant to ampicillin. On discharge  09/23/2017, the patient's Lasix was held, and placed on Ceftin 500 mg twice a day hospitalization. She was noted to have acute kidney injury on admission, but on discharge and was 0.98. PT was ordered for home, as outpatient deconditioning improvement was necessary.  When seen in March weight was up to 165. Recently noted increase in edema. Was started on lasix 20 mg every other day. BP was noted to be low and atenolol and losartan were held. On her last visit atenolol was resumed at a lower dose of 50 mg daily.  She was seen in the ED in April with hyperglycemia and anemia with Hgb 7.5. She was transfused 1 unit PRBCs. Felt to have bleeding hemorrhoids. Follow up with GI recommended. She is followed by Dr. Loanne Mccormick for her DM.   Since then she notes significant improvement in edema. Weight is down to 150 lbs. She has been monitoring BP which has increased since above changes. It runs 100-712 systolic. HR has also increased into the 115 range. She denies any SOB or chest pain.    Past Medical History:  Diagnosis Date  . Cancer [facility])    history of no adjunctive RX( no chemo no RT)  . Cerebrovascular accident ([place])   . COLONIC POLYPS, HX OF 12/10/2006   Qualifier: Diagnosis of  By: Anita Mccormick, Anita Mccormick    . Diabetes mellitus     Type 2  . Diabetes mellitus type II, controlled ([place]) 12/10/2006   Poor control on Januvia 162m and glipizide 197mXL Lab Results  Component Value Date   HGBA1C 8.3* 11/02/2014       . Eczema   . Fatty liver disease, nonalcoholic 1219/75/8832 Per Dr. SwLeanne Changab Results  Component Value Date   ALT 31 11/02/2014   AST 57* 11/02/2014   ALKPHOS 104 11/02/2014   BILITOT 1.1 11/02/2014      . Hypertension     Past Surgical History:  Procedure Laterality Date  . CATARACT EXTRACTION Left   . MASTECTOMY Left   . TUBAL LIGATION       Current Outpatient Medications  Medication Sig Dispense Refill  . aspirin 325 MG tablet Take 325 mg by mouth at bedtime.     . Marland Kitchentenolol (TENORMIN) 50 MG tablet Take 1 tablet (50 mg total) by mouth daily. (Patient taking differently: Take 50 mg by mouth 2 (two) times daily. ) 90 tablet 3  . Blood Glucose Monitoring Suppl (ONETOUCH ULTRALINK) w/Device KIT Test twice daily (onetouch ultra) 1 each 0  . Blood Pressure Monitoring (BLOOD PRESSURE CUFF) MISC Use daily to monitor blood pressue 1 each 0  . Calcium Carbonate-Vitamin D (CALCIUM PLUS VITAMIN D) 600-100 MG-UNIT CAPS Take 1 capsule by mouth 2 (two) times daily.     . Dulaglutide (  TRULICITY) 1.5 HY/8.6VH SOPN Inject 1.5 mg into the skin once a week. 4 pen 11  . ferrous sulfate 325 (65 FE) MG tablet Take 1 tablet (325 mg total) by mouth daily. 15 tablet 1  . furosemide (LASIX) 20 MG tablet Take 1 tablet (20 mg total) by mouth every other day. 30 tablet 11  . glipiZIDE (GLUCOTROL XL) 10 MG 24 hr tablet Take 1 tablet (10 mg total) by mouth every morning. 90 tablet 2  . glucose blood test strip Use as instructed.  For TID testing, more for hypo or hyperglycemia. 200 each 11  . omeprazole (PRILOSEC) 20 MG capsule Take 2 capsules (40 mg total) by mouth daily. 60 capsule 0   No current facility-administered medications for this visit.     Allergies:   Ace inhibitors; Chlorthalidone; Metformin; Penicillins; and  Sulfamethoxazole    Social History:  The patient  reports that she has been smoking cigarettes.  She has been smoking about 0.10 packs per day. She has never used smokeless tobacco. She reports that she drinks alcohol. She reports that she does not use drugs.   Family History:  The patient's family history includes Cancer in her father; Diabetes in her sister; Pancreatic cancer in her sister; Stroke in her mother.    ROS: All other systems are reviewed and negative. Unless otherwise mentioned in H&P    PHYSICAL EXAM: VS:  There were no vitals taken for this visit. , BMI There is no height or weight on file to calculate BMI. GENERAL:  Well appearing BF in NAD HEENT:  PERRL, EOMI, sclera are clear. Oropharynx is clear. NECK:  No jugular venous distention, carotid upstroke brisk and symmetric, no bruits, no thyromegaly or adenopathy LUNGS:  Clear to auscultation bilaterally CHEST:  Unremarkable HEART:  RRR,  PMI not displaced or sustained,S1 and S2 within normal limits, no S3, no S4: no clicks, no rubs, no murmurs ABD:  Soft, nontender. BS +, no masses or bruits. No hepatomegaly, no splenomegaly EXT:  2 + pulses throughout, 1+ edema, no cyanosis no clubbing SKIN:  Warm and dry.  No rashes NEURO:  Alert and oriented x 3. Cranial nerves II through XII intact. PSYCH:  Cognitively intact    Recent Labs: 09/19/2017: ALT 25; B Natriuretic Peptide 334.9 11/04/2017: Pro B Natriuretic peptide (BNP) 82.0 11/16/2017: BUN 11; Creatinine, Ser 0.98; Potassium 4.0; Sodium 135 11/17/2017: Hemoglobin 8.5; Platelets 130    Lipid Panel    Component Value Date/Time   CHOL 146 03/18/2017 0851   TRIG 154.0 (H) 03/18/2017 0851   TRIG 202 (HH) 07/02/2006 1110   HDL 49.80 03/18/2017 0851   CHOLHDL 3 03/18/2017 0851   VLDL 30.8 03/18/2017 0851   LDLCALC 65 03/18/2017 0851   LDLDIRECT 95.8 07/02/2006 1110      Wt Readings from Last 3 Encounters:  12/15/17 145 lb 6.4 oz (66 kg)  11/26/17 151 lb (68.5  kg)  11/19/17 152 lb 11.2 oz (69.3 kg)    Other studies Reviewed:  Myoview 08/26/17: Study Highlights    The left ventricular ejection fraction is hyperdynamic (>65%).  Nuclear stress EF: 70%.  There was no ST segment deviation noted during stress.  No T wave inversion was noted during stress.  The study is normal.  This is a low risk study.    Echocardiogram 08/11/2017 Left ventricle: The cavity size was normal. Wall thickness was   increased in a pattern of mild LVH. Systolic function was normal.   The estimated ejection  fraction was in the range of 60% to 65%.   Wall motion was normal; there were no regional wall motion   abnormalities. Doppler parameters are consistent with abnormal   left ventricular relaxation (grade 1 diastolic dysfunction).   Doppler parameters are consistent with high ventricular filling   pressure. - Mitral valve: Calcified annulus. - Left atrium: The atrium was mildly dilated.  Impressions:  - Normal LV systolic function; mild LVH; mild diastolic   dysfunction; mild LAE.  ASSESSMENT AND PLAN:  1. Hypertension: Blood pressure recordings are higher now that BP medications held for hypotension. Will renew Atenolol at lower dose of 50 mg daily. Monitor BP. If it remains high would resume losartan 25 mg daily. HR increased due to stopping beta blocker. Was on atenolol 100 mg bid which I think is too high a dose for her.   2. Chronic diastolic heart failure: I am not convinced all her swelling is due to CHF. Her edema has improved significantly with lasix qod. Continue current therapy.  3. Diabetes: per primary care.   4. Anemia s/p transfusion in April.    I will follow up in 3 months.  Caroleena Paolini [place] Mccormick, [facility]     01/02/2018 9:57 AM    [facility]

## 2018-01-06 ENCOUNTER — Ambulatory Visit: Payer: Medicare Other | Admitting: Cardiology

## 2018-02-04 ENCOUNTER — Ambulatory Visit: Payer: Medicare Other | Admitting: Cardiology

## 2018-02-18 ENCOUNTER — Ambulatory Visit: Payer: Medicare Other | Admitting: Endocrinology

## 2018-02-18 ENCOUNTER — Ambulatory Visit: Payer: Medicare Other | Admitting: Family Medicine

## 2018-02-18 ENCOUNTER — Encounter: Payer: Self-pay | Admitting: Family Medicine

## 2018-02-18 VITALS — BP 124/66 | HR 86 | Temp 98.7°F | Wt 145.0 lb

## 2018-02-18 DIAGNOSIS — E118 Type 2 diabetes mellitus with unspecified complications: Secondary | ICD-10-CM

## 2018-02-18 MED ORDER — ONETOUCH ULTRASOFT LANCETS MISC: 12 refills | Status: DC

## 2018-02-18 NOTE — Progress Notes (Signed)
Subjective:    Patient ID: Anita Mccormick, female    DOB: [birth year], 72 y.o.   MRN: [id number]  No chief complaint on file.   HPI Patient was seen today for follow-up.  DM type II: -taking Trulicity 1.5 mg per 0.5 mL weekly, glipizide XL 10 mg daily. -Pt was seeing endocrinology, Dr. Loanne Drilling -After last visit pt instructed to "double up" on Trulicity?  pt did not increase the dose.  She states blood sugar was 60 that next morning.  Pt was asymptomatic ate a peppermint. -This episode made pt hesitant to go back to endocrinology. -Pt's blood sugars typically 1 teens to 170s.  One fsbs reading of 343. -pt states she has been feeling much better since being on trulicity, but does note bowel "soreness" like she needs to have a BM the day she takes the med.  The feeling resolves the next day. -request refills on lancets.  Past Medical History:  Diagnosis Date  . Cancer [facility])    history of no adjunctive RX( no chemo no RT)  . Cerebrovascular accident ([place])   . COLONIC POLYPS, HX OF 12/10/2006   Qualifier: Diagnosis of  By: Leanne Chang MD, Bruce    . Diabetes mellitus    Type 2  . Diabetes mellitus type II, controlled ([place]) 12/10/2006   Poor control on Januvia 100mg  and glipizide 10mg  XL Lab Results  Component Value Date   HGBA1C 8.3* 11/02/2014       . Eczema   . Fatty liver disease, nonalcoholic 71/69/6789   Per Dr. Leanne Chang Lab Results  Component Value Date   ALT 31 11/02/2014   AST 57* 11/02/2014   ALKPHOS 104 11/02/2014   BILITOT 1.1 11/02/2014      . Hypertension     Allergies  Allergen Reactions  . Ace Inhibitors Cough    ????  . Chlorthalidone     REACTION: unspecified  . Metformin     REACTION: gi side effects  . Penicillins     REACTION: urticaria (hives)  . Sulfamethoxazole     REACTION: questionable    ROS General: Denies fever, chills, night sweats, changes in weight, changes in appetite HEENT: Denies headaches, ear pain, changes in vision, rhinorrhea, sore throat CV:  Denies CP, palpitations, SOB, orthopnea Pulm: Denies SOB, cough, wheezing GI: Denies abdominal pain, nausea, vomiting, diarrhea, constipation  +"bowel soreness" GU: Denies dysuria, hematuria, frequency, vaginal discharge Msk: Denies muscle cramps, joint pains Neuro: Denies weakness, numbness, tingling Skin: Denies rashes, bruising Psych: Denies depression, anxiety, hallucinations     Objective:    Blood pressure 124/66, pulse 86, temperature 98.7 F (37.1 C), temperature source Oral, weight 145 lb (65.8 kg), SpO2 98 %.   Gen. Pleasant, well-nourished, in no distress, normal affect  HEENT: [place]/AT, face symmetric, no scleral icterus, PERRLA, nares patent without drainage Lungs: no accessory muscle use, CTAB, no wheezes or rales Cardiovascular: RRR, no m/r/g, no peripheral edema Abdomen: BS present, soft, NT/ND Neuro:  A&Ox3, CN II-XII intact, normal gait Skin:  Warm, no lesions/ rash   Wt Readings from Last 3 Encounters:  02/18/18 145 lb (65.8 kg)  12/15/17 145 lb 6.4 oz (66 kg)  11/26/17 151 lb (68.5 kg)    Lab Results  Component Value Date   WBC 5.7 11/17/2017   HGB 8.5 (L) 11/17/2017   HCT 27.1 (L) 11/17/2017   PLT 130 (L) 11/17/2017   GLUCOSE 351 (H) 11/16/2017   CHOL 146 03/18/2017   TRIG 154.0 (H) 03/18/2017  HDL 49.80 03/18/2017   LDLDIRECT 95.8 07/02/2006   LDLCALC 65 03/18/2017   ALT 25 09/19/2017   AST 68 (H) 09/19/2017   NA 135 11/16/2017   K 4.0 11/16/2017   CL 101 11/16/2017   CREATININE 0.98 11/16/2017   BUN 11 11/16/2017   CO2 23 11/16/2017   TSH 1.32 04/11/2014   INR 1.0 10/18/2007   HGBA1C 6.6 (A) 12/15/2017   MICROALBUR 0.2 04/11/2014    Assessment/Plan:  Controlled diabetes mellitus type 2 with complications, unspecified whether long term insulin use (HCC) -Last hemoglobin A1c 6.6% on 12/15/2017 -FSBS in clinic 282 -Continue Trulicity 1.5 KS/1.3 ML solution and glipizide XL 10 mg -Continue checking FS BS daily and keeping her  log. -Continue lifestyle modifications and decrease the amount of sweets and carbohydrates you are eating - Plan: Lancets (ONETOUCH ULTRASOFT) lancets  F/u in 2-3 months, sooner if needed  [name], MD

## 2018-02-20 ENCOUNTER — Ambulatory Visit: Payer: Medicare Other | Admitting: Family Medicine

## 2018-02-20 ENCOUNTER — Encounter: Payer: Self-pay | Admitting: Family Medicine

## 2018-02-20 DIAGNOSIS — R300 Dysuria: Secondary | ICD-10-CM | POA: Insufficient documentation

## 2018-02-20 DIAGNOSIS — R600 Localized edema: Secondary | ICD-10-CM

## 2018-02-20 MED ORDER — NITROFURANTOIN MONOHYD MACRO 100 MG PO CAPS: 100.0000 mg | ORAL_CAPSULE | Freq: Two times a day (BID) | ORAL | 0 refills | Status: DC

## 2018-02-20 MED ORDER — POTASSIUM CHLORIDE CRYS ER 20 MEQ PO TBCR: 20.0000 meq | EXTENDED_RELEASE_TABLET | Freq: Every day | ORAL | Status: DC | PRN

## 2018-02-20 MED ORDER — ATENOLOL 50 MG PO TABS: 50.0000 mg | ORAL_TABLET | Freq: Two times a day (BID) | ORAL | Status: DC

## 2018-02-20 MED ORDER — SITAGLIPTIN PHOSPHATE 100 MG PO TABS: ORAL_TABLET | ORAL | Status: DC

## 2018-02-20 NOTE — Patient Instructions (Addendum)
Drink plenty of water and start the antibiotics today.  Take care.   Update your regular doc if not better.

## 2018-02-20 NOTE — Progress Notes (Signed)
She is off aspirin since dental work was done.   D/w pt about lasix use, only prn, takes potassium only when taking lasix.  Copy of med list given to patient to verify at home.  D/w pt.    She had been nauseated. Pain/pressure with urination.  Started recently.  Urine has been orangish/yellowish recently, darker than typical.  No fevers.    Sugar has been better with med changes per other MD.    Meds, vitals, and allergies reviewed.   Per HPI unless specifically indicated in ROS section   GEN: nad, alert and oriented HEENT: mucous membranes moist NECK: supple CV: rrr.  PULM: ctab, no inc wob ABD: soft, +bs, suprapubic area tender w/o rebound EXT: no edema SKIN: well perfused.  BACK: no CVA pain  She couldn't give u/a sample at the [facility]. D/w pt about options.

## 2018-02-20 NOTE — Assessment & Plan Note (Signed)
Presumed cystitis.  Nontoxic.  She couldn't give u/a sample at the [facility]. D/w pt about options.  Start macrobid.  She'll update PCP prn.  Med list given to patient to verify at home.  Patient agrees with plan.

## 2018-02-28 ENCOUNTER — Other Ambulatory Visit: Payer: Self-pay | Admitting: Family Medicine

## 2018-03-01 NOTE — Telephone Encounter (Signed)
Pt is requesting for refills on her Januvia, Please Dr Loanne Drilling advise

## 2018-03-01 NOTE — Telephone Encounter (Signed)
This was changed to trulicity

## 2018-03-04 ENCOUNTER — Other Ambulatory Visit: Payer: Self-pay | Admitting: Family Medicine

## 2018-03-04 MED ORDER — FLUCONAZOLE 150 MG PO TABS: 150.0000 mg | ORAL_TABLET | Freq: Once | ORAL | 0 refills | Status: AC

## 2018-03-04 NOTE — Telephone Encounter (Signed)
The patient came in today wanting to be put back on sitaGLIPtin (JANUVIA) 100 MG tablet  Renato Shin took her off of this Rx and she would like to be put back on it.   She would also like to have something sent in for a yeast infection. She is tired of scratching.  [id number] [place], [place] [address] [phone] (Phone) [phone] (Fax)

## 2018-03-04 NOTE — Telephone Encounter (Signed)
Rx has been sent in. 

## 2018-03-04 NOTE — Addendum Note (Signed)
Addended by: Rebecca Eaton on: 03/04/2018 01:12 PM   Modules accepted: Orders

## 2018-03-16 ENCOUNTER — Telehealth: Payer: Self-pay

## 2018-03-16 NOTE — Telephone Encounter (Signed)
Call to ms. Wessels to schedule an AWV. Stated she was having cataract surgery next week. She stated [employer] has been out and I educated on the AWV being under the purview of her doctor and has to be done in the office.  She agreed to come in at some point after her cataract surgery. Wished her well  Wynetta Fines RN

## 2018-03-24 ENCOUNTER — Encounter: Payer: Self-pay | Admitting: Podiatry

## 2018-03-24 ENCOUNTER — Ambulatory Visit: Payer: Medicare Other | Admitting: Podiatry

## 2018-03-24 DIAGNOSIS — E119 Type 2 diabetes mellitus without complications: Secondary | ICD-10-CM | POA: Diagnosis not present

## 2018-03-24 DIAGNOSIS — B351 Tinea unguium: Secondary | ICD-10-CM

## 2018-03-24 DIAGNOSIS — M79675 Pain in left toe(s): Secondary | ICD-10-CM | POA: Diagnosis not present

## 2018-03-24 DIAGNOSIS — M79674 Pain in right toe(s): Secondary | ICD-10-CM | POA: Diagnosis not present

## 2018-03-24 NOTE — Progress Notes (Signed)
Complaint:  Visit Type: Patient returns to my office for continued preventative foot care services. Complaint: Patient states" my nails have grown long and thick and become painful to walk and wear shoes" Patient has been diagnosed with DM with no foot complications. The patient presents for preventative foot care services. She presents to the office for preventative foot care services.  Podiatric Exam: Vascular: dorsalis pedis and posterior tibial pulses are palpable bilateral. Capillary return is immediate. Temperature gradient is WNL. Skin turgor WNL  Sensorium: Normal Semmes Weinstein monofilament test. Normal tactile sensation bilaterally. Nail Exam: Pt has thick disfigured discolored nails with subungual debris noted bilateral entire nail hallux through fifth toenails Ulcer Exam: There is no evidence of ulcer or pre-ulcerative changes or infection. Orthopedic Exam: Muscle tone and strength are WNL. No limitations in general ROM. No crepitus or effusions noted. Adducto-varus fifth digits  both fifth toes. Skin: No Porokeratosis. No infection or ulcers.  Lister corn  B/L  Diagnosis:  Onychomycosis, , Pain in right toe, pain in left toes,    Treatment & Plan Procedures and Treatment: Consent by patient was obtained for treatment procedures.   Debridement of mycotic and hypertrophic toenails, 1 through 5 bilateral and clearing of subungual debris. No ulceration, no infection noted. Return Visit-Office Procedure: Patient instructed to return to the office for a follow up visit 3 months for continued evaluation and treatment.    Gardiner Barefoot DPM

## 2018-04-30 ENCOUNTER — Telehealth: Payer: Self-pay | Admitting: Family Medicine

## 2018-04-30 ENCOUNTER — Ambulatory Visit (INDEPENDENT_AMBULATORY_CARE_PROVIDER_SITE_OTHER): Payer: Medicare Other

## 2018-04-30 DIAGNOSIS — Z23 Encounter for immunization: Secondary | ICD-10-CM

## 2018-04-30 NOTE — Telephone Encounter (Signed)
Noted. Thanks.

## 2018-04-30 NOTE — Progress Notes (Signed)
Per orders of Dr. Volanda Napoleon, injection of Fluzone given by Franco Collet. Patient tolerated injection well.

## 2018-04-30 NOTE — Telephone Encounter (Signed)
Pt came by and wanted Dr. Volanda Napoleon to know who her Ophthalmologist is it is Dr. Josephina Gip @ [address] [place] [address] [place], [place] [address]  [phone] and she also goes to the [facility] [address], [place], [place] [id number] [phone].

## 2018-05-13 ENCOUNTER — Telehealth: Payer: Self-pay | Admitting: Family Medicine

## 2018-05-14 ENCOUNTER — Other Ambulatory Visit: Payer: Self-pay | Admitting: Family Medicine

## 2018-05-17 NOTE — Telephone Encounter (Signed)
Patient only has one pill left

## 2018-05-17 NOTE — Telephone Encounter (Signed)
Returned call to pt who states that she has contacted the pharmacy and is aware that refill of medication is available. No other concerns voiced at this time.

## 2018-05-30 ENCOUNTER — Other Ambulatory Visit: Payer: Self-pay | Admitting: Family Medicine

## 2018-05-30 DIAGNOSIS — J069 Acute upper respiratory infection, unspecified: Secondary | ICD-10-CM

## 2018-05-30 DIAGNOSIS — B9789 Other viral agents as the cause of diseases classified elsewhere: Principal | ICD-10-CM

## 2018-06-01 ENCOUNTER — Other Ambulatory Visit: Payer: Self-pay

## 2018-06-01 ENCOUNTER — Ambulatory Visit: Payer: Medicare Other | Admitting: Family Medicine

## 2018-06-01 ENCOUNTER — Encounter: Payer: Self-pay | Admitting: Family Medicine

## 2018-06-01 VITALS — BP 110/62 | HR 87 | Temp 97.9°F | Ht 62.0 in | Wt 151.9 lb

## 2018-06-01 DIAGNOSIS — J209 Acute bronchitis, unspecified: Secondary | ICD-10-CM | POA: Diagnosis not present

## 2018-06-01 MED ORDER — BENZONATATE 100 MG PO CAPS: 100.0000 mg | ORAL_CAPSULE | Freq: Three times a day (TID) | ORAL | 1 refills | Status: DC | PRN

## 2018-06-01 NOTE — Patient Instructions (Signed)

## 2018-06-01 NOTE — Progress Notes (Signed)
  Subjective:     Patient ID: Anita Mccormick, female   DOB: 11-30-1945, 72 y.o.   MRN: [id number]  HPI Patient seen as a work-in with cough for the past few days.  She still smokes about 5 cigarettes/day.  She states she had onset of illness over the weekend.  Her cough is productive of clear sputum.  No hemoptysis.  No fevers or chills.  No body aches.  Minimal nasal congestion.  She had leftover Tessalon which helps her cough and she is requesting refill.  Past Medical History:  Diagnosis Date  . Cancer [facility])    history of no adjunctive RX( no chemo no RT)  . Cerebrovascular accident ([place])   . COLONIC POLYPS, HX OF 12/10/2006   Qualifier: Diagnosis of  By: Leanne Chang MD, Bruce    . Diabetes mellitus    Type 2  . Diabetes mellitus type II, controlled ([place]) 12/10/2006   Poor control on Januvia 100mg  and glipizide 10mg  XL Lab Results  Component Value Date   HGBA1C 8.3* 11/02/2014       . Eczema   . Fatty liver disease, nonalcoholic 38/75/6433   Per Dr. Leanne Chang Lab Results  Component Value Date   ALT 31 11/02/2014   AST 57* 11/02/2014   ALKPHOS 104 11/02/2014   BILITOT 1.1 11/02/2014      . Hypertension    Past Surgical History:  Procedure Laterality Date  . CATARACT EXTRACTION Left   . MASTECTOMY Left   . TUBAL LIGATION      reports that she has been smoking cigarettes. She has been smoking about 0.10 packs per day. She has never used smokeless tobacco. She reports that she drinks alcohol. She reports that she does not use drugs. family history includes Cancer in her father; Diabetes in her sister; Pancreatic cancer in her sister; Stroke in her mother. Allergies  Allergen Reactions  . Ace Inhibitors Cough    ????  . Chlorthalidone     REACTION: unspecified  . Metformin     REACTION: gi side effects  . Penicillins     REACTION: urticaria (hives)  . Sulfamethoxazole     REACTION: questionable     Review of Systems  Constitutional: Negative for chills and fever.  HENT: Positive for  congestion.   Respiratory: Positive for cough. Negative for shortness of breath and wheezing.   Cardiovascular: Negative for chest pain.       Objective:   Physical Exam  Constitutional: She appears well-developed and well-nourished.  HENT:  Right Ear: External ear normal.  Left Ear: External ear normal.  Mouth/Throat: Oropharynx is clear and moist.  Neck: Neck supple.  Cardiovascular: Normal rate and regular rhythm.  Pulmonary/Chest: Effort normal and breath sounds normal. She has no wheezes. She has no rales.       Assessment:     Cough.  Suspect acute viral bronchitis    Plan:     -Refill Tessalon 100 mg every 8 hours as needed for cough -Plenty of fluids and rest -Follow-up immediately for any fever or increasing shortness of breath or other concerns  Eulas Post MD [facility]

## 2018-06-14 ENCOUNTER — Telehealth: Payer: Self-pay | Admitting: Family Medicine

## 2018-06-14 NOTE — Telephone Encounter (Signed)
Spoke with Mrs. Leedy regarding AWV. Patient declined to schedule at this time. Would like a call next year to schedule wellness appointment. SF

## 2018-06-16 ENCOUNTER — Encounter: Payer: Self-pay | Admitting: Podiatry

## 2018-06-16 ENCOUNTER — Ambulatory Visit: Payer: Medicare Other | Admitting: Podiatry

## 2018-06-16 DIAGNOSIS — B351 Tinea unguium: Secondary | ICD-10-CM

## 2018-06-16 DIAGNOSIS — M79675 Pain in left toe(s): Secondary | ICD-10-CM

## 2018-06-16 DIAGNOSIS — M79674 Pain in right toe(s): Secondary | ICD-10-CM | POA: Diagnosis not present

## 2018-06-16 DIAGNOSIS — E119 Type 2 diabetes mellitus without complications: Secondary | ICD-10-CM | POA: Diagnosis not present

## 2018-06-16 NOTE — Progress Notes (Signed)
Complaint:  Visit Type: Patient returns to my office for continued preventative foot care services. Complaint: Patient states" my nails have grown long and thick and become painful to walk and wear shoes" Patient has been diagnosed with DM with no foot complications. The patient presents for preventative foot care services. She presents to the office for preventative foot care services.  Podiatric Exam: Vascular: dorsalis pedis and posterior tibial pulses are palpable bilateral. Capillary return is immediate. Temperature gradient is WNL. Skin turgor WNL  Sensorium: Normal Semmes Weinstein monofilament test. Normal tactile sensation bilaterally. Nail Exam: Pt has thick disfigured discolored nails with subungual debris noted bilateral entire nail hallux through fifth toenails Ulcer Exam: There is no evidence of ulcer or pre-ulcerative changes or infection. Orthopedic Exam: Muscle tone and strength are WNL. No limitations in general ROM. No crepitus or effusions noted. Adducto-varus fifth digits  both fifth toes. Skin: No Porokeratosis. No infection or ulcers.  Lister corn  B/L  Diagnosis:  Onychomycosis, , Pain in right toe, pain in left toes,    Treatment & Plan Procedures and Treatment: Consent by patient was obtained for treatment procedures.   Debridement of mycotic and hypertrophic toenails, 1 through 5 bilateral and clearing of subungual debris. No ulceration, no infection noted..  Patient is concerned about discolored skin lesions right foot and heel.  Patient speaks soft spokenly and was difficult to hear.  Told her since there was no inflammation or redness or swelling or inflammation to continue to watch these lesions. Return Visit-Office Procedure: Patient instructed to return to the office for a follow up visit 3 months for continued evaluation and treatment.    Gardiner Barefoot DPM

## 2018-06-25 ENCOUNTER — Other Ambulatory Visit: Payer: Self-pay | Admitting: Family Medicine

## 2018-08-17 ENCOUNTER — Telehealth: Payer: Self-pay

## 2018-08-17 NOTE — Telephone Encounter (Signed)
Noted  

## 2018-08-17 NOTE — Telephone Encounter (Signed)
Copied from [place] [id number]. Topic: General - Other >> Aug 17, 2018  9:16 AM Bea Graff, NT wrote: Reason for CRM: Bridgett with [facility] calling to state she will be sending over info that the pts may need reevaluating for her statin therapy for diabetics. CB#: [phone]

## 2018-09-15 ENCOUNTER — Encounter: Payer: Self-pay | Admitting: Podiatry

## 2018-09-15 ENCOUNTER — Ambulatory Visit: Payer: Medicare Other | Admitting: Podiatry

## 2018-09-15 DIAGNOSIS — M79675 Pain in left toe(s): Secondary | ICD-10-CM

## 2018-09-15 DIAGNOSIS — E119 Type 2 diabetes mellitus without complications: Secondary | ICD-10-CM | POA: Diagnosis not present

## 2018-09-15 DIAGNOSIS — M79674 Pain in right toe(s): Secondary | ICD-10-CM

## 2018-09-15 DIAGNOSIS — B351 Tinea unguium: Secondary | ICD-10-CM | POA: Diagnosis not present

## 2018-09-15 NOTE — Progress Notes (Signed)
Complaint:  Visit Type: Patient returns to my office for continued preventative foot care services. Complaint: Patient states" my nails have grown long and thick and become painful to walk and wear shoes" Patient has been diagnosed with DM with no foot complications. The patient presents for preventative foot care services. She presents to the office for preventative foot care services.  Podiatric Exam: Vascular: dorsalis pedis and posterior tibial pulses are palpable bilateral. Capillary return is immediate. Temperature gradient is WNL. Skin turgor WNL  Sensorium: Normal Semmes Weinstein monofilament test. Normal tactile sensation bilaterally. Nail Exam: Pt has thick disfigured discolored nails with subungual debris noted bilateral entire nail hallux through fifth toenails Ulcer Exam: There is no evidence of ulcer or pre-ulcerative changes or infection. Orthopedic Exam: Muscle tone and strength are WNL. No limitations in general ROM. No crepitus or effusions noted. Adducto-varus fifth digits  both fifth toes. Skin: No Porokeratosis. No infection or ulcers.  Lister corn  B/L.  Plantar callus heel  B/L.  Diagnosis:  Onychomycosis, , Pain in right toe, pain in left toes,    Treatment & Plan Procedures and Treatment: Consent by patient was obtained for treatment procedures.   Debridement of mycotic and hypertrophic toenails, 1 through 5 bilateral and clearing of subungual debris. No ulceration, no infection noted..  Padding for corn fifth toe right Return Visit-Office Procedure: Patient instructed to return to the office for a follow up visit 3 months for continued evaluation and treatment.    Gardiner Barefoot DPM

## 2018-09-23 ENCOUNTER — Other Ambulatory Visit: Payer: Self-pay | Admitting: Family Medicine

## 2018-11-11 ENCOUNTER — Other Ambulatory Visit: Payer: Self-pay | Admitting: Family Medicine

## 2018-11-15 ENCOUNTER — Other Ambulatory Visit: Payer: Self-pay | Admitting: Family Medicine

## 2018-12-07 ENCOUNTER — Other Ambulatory Visit: Payer: Self-pay | Admitting: Endocrinology

## 2018-12-14 ENCOUNTER — Ambulatory Visit: Payer: Medicare Other | Admitting: Podiatry

## 2018-12-15 ENCOUNTER — Ambulatory Visit: Payer: Medicare Other | Admitting: Podiatry

## 2018-12-19 ENCOUNTER — Other Ambulatory Visit: Payer: Self-pay | Admitting: Family Medicine

## 2018-12-21 ENCOUNTER — Encounter: Payer: Self-pay | Admitting: Family Medicine

## 2018-12-21 ENCOUNTER — Ambulatory Visit (INDEPENDENT_AMBULATORY_CARE_PROVIDER_SITE_OTHER): Payer: Medicare Other | Admitting: Family Medicine

## 2018-12-21 ENCOUNTER — Other Ambulatory Visit: Payer: Self-pay

## 2018-12-21 ENCOUNTER — Telehealth: Payer: Self-pay | Admitting: Family Medicine

## 2018-12-21 ENCOUNTER — Other Ambulatory Visit: Payer: Self-pay | Admitting: Endocrinology

## 2018-12-21 DIAGNOSIS — E1129 Type 2 diabetes mellitus with other diabetic kidney complication: Secondary | ICD-10-CM

## 2018-12-21 NOTE — Telephone Encounter (Signed)
Pt states that she no longer taking this medication, pt is scheduled for a virtual visit with Dr Volanda Napoleon at 4.30 pm

## 2018-12-21 NOTE — Progress Notes (Signed)
Virtual Visit via Telephone Note  I connected with Anita Mccormick on 12/21/18 at  4:30 PM EDT by telephone and verified that I am speaking with the correct person using two identifiers.   I discussed the limitations, risks, security and privacy concerns of performing an evaluation and management service by telephone and the availability of in person appointments. I also discussed with the patient that there may be a patient responsible charge related to this service. The patient expressed understanding and agreed to proceed.  Location patient: home Location provider: work or home office Participants present for the call: patient, provider Patient did not have a visit in the prior 7 days to address this/these issue(s).   History of Present Illness: Pt inquires about what else she can take for DM.  Pt states she is in the "donut hole" for her insurance which made her Trulicity $597.   Pt is unsure how much her rx will cost this month.  She is planning to call the pharmacy.  Pt is also taking glipizideXL 10 mg BID.  Pt is no longer taking Januvia.  Pt states she had to stop going to Endo as she could not afford the $45 copay each visit.   Before provider could ask about what pt's fsbs have been, pt's husband (also seen by this provider) got on the phone to ask a question. After pt's husband's question answered, the pt states she will talk with this provider tomorrow.   Observations/Objective: Patient sounds cheerful and well on the phone. I do not appreciate any SOB. Speech and thought processing are grossly intact. Patient reported vitals:  Assessment and Plan: Type 2 diabetes mellitus with other diabetic kidney complication, without long-term current use of insulin ([place]) -pt will contact pharmacy regarding likely medication cost. -will contact pt tomorrow regarding possible samples of Trulicity if available.  Will also look for any medication coupons or discount info -will have pt come in  for Hgb A1C -continue glipizide XL 10 mg BID -will also inquire about fsbs readings and make adjustments to meds if needed.  Follow Up Instructions: Will contact pt tomorrow.  Otherwise f/u in 1 month.    I did not refer this patient for an OV in the next 24 hours for this/these issue(s).  I discussed the assessment and treatment plan with the patient. The patient was provided an opportunity to ask questions and all were answered. The patient agreed with the plan and demonstrated an understanding of the instructions.   The patient was advised to call back or seek an in-person evaluation if the symptoms worsen or if the condition fails to improve as anticipated.  I provided 12 minutes of non-face-to-face time during this encounter.   Billie Ruddy, MD

## 2018-12-21 NOTE — Telephone Encounter (Signed)
Copied from [place] [id number]. Topic: Quick Communication - Rx Refill/Question >> Dec 21, 2018  4:52 PM Reyne Dumas L wrote: Medication: (TRULICITY) 1.5 YY/3.4JY SOPN  Has the patient contacted their pharmacy? Yes - no refills left (Agent: If no, request that the patient contact the pharmacy for the refill.) (Agent: If yes, when and what did the pharmacy advise?)  Preferred Pharmacy (with phone number or street name): [id number] [place] [address], [place] [phone] (Phone) [phone] (Fax)  Agent: Please be advised that RX refills may take up to 3 business days. We ask that you follow-up with your pharmacy.

## 2018-12-22 ENCOUNTER — Other Ambulatory Visit: Payer: Self-pay | Admitting: Family Medicine

## 2018-12-22 ENCOUNTER — Telehealth: Payer: Self-pay | Admitting: Family Medicine

## 2018-12-22 NOTE — Telephone Encounter (Signed)
Spoke with pt aware to pick up Trulicity samples from the office.

## 2018-12-22 NOTE — Telephone Encounter (Signed)
Rx sent to pt pharmacy 

## 2018-12-22 NOTE — Telephone Encounter (Unsigned)
Copied from [place] [phone]. Topic: Quick Communication - Rx Refill/Question >> Dec 22, 2018  2:45 PM Mcneil, Jacinto Reap wrote: Pt stated she is in the donut and can not afford to keep paying $50 to see the specialist that originally prescribed the Trulicity.  Medication: Dulaglutide (TRULICITY) 1.5 GN/0.0BB SOPN  Has the patient contacted their pharmacy? no  Preferred Pharmacy (with phone number or street name): [id number] [place], [place] [phone] (Phone)  [phone] (Fax)  Agent: Please be advised that RX refills may take up to 3 business days. We ask that you follow-up with your pharmacy.

## 2018-12-22 NOTE — Telephone Encounter (Signed)
Copied from [place] [phone]. Topic: General - Other >> Dec 22, 2018  2:42 PM Yvette Rack wrote: Reason for CRM: Pt stated she spoke with Dr. Volanda Napoleon yesterday and she was told to call and speak with the nurse regarding samples for Dulaglutide (TRULICITY) 1.5 PY/0.9XI SOPN. Pt stated she is in the donut hole and out of the medication. Pt requests a call back regarding samples

## 2018-12-22 NOTE — Telephone Encounter (Signed)
Pt calling back to check status. Pt states that she has no refills left. Please advise. Pt states that she needs to take this Thursday the 28th. Pt would like call back when this is filled.

## 2019-01-21 ENCOUNTER — Other Ambulatory Visit: Payer: Self-pay

## 2019-01-21 ENCOUNTER — Encounter: Payer: Self-pay | Admitting: Podiatry

## 2019-01-21 ENCOUNTER — Ambulatory Visit (INDEPENDENT_AMBULATORY_CARE_PROVIDER_SITE_OTHER): Payer: Medicare Other | Admitting: Podiatry

## 2019-01-21 VITALS — Temp 98.2°F

## 2019-01-21 DIAGNOSIS — M79674 Pain in right toe(s): Secondary | ICD-10-CM | POA: Diagnosis not present

## 2019-01-21 DIAGNOSIS — B351 Tinea unguium: Secondary | ICD-10-CM

## 2019-01-21 DIAGNOSIS — M79675 Pain in left toe(s): Secondary | ICD-10-CM | POA: Diagnosis not present

## 2019-01-21 DIAGNOSIS — Q828 Other specified congenital malformations of skin: Secondary | ICD-10-CM

## 2019-01-21 DIAGNOSIS — E119 Type 2 diabetes mellitus without complications: Secondary | ICD-10-CM

## 2019-01-23 NOTE — Progress Notes (Signed)
Subjective: 73 y.o. returns the office today for painful, elongated, thickened toenails which she cannot trim herself as well as for painful callus on her right fifth toe worse than left. Denies any redness or drainage around the nails. Denies any acute changes since last appointment and no new complaints today. Denies any systemic complaints such as fevers, chills, nausea, vomiting.   PCP: Billie Ruddy, MD   Objective: AAO 3, NAD DP/PT pulses palpable, CRT less than 3 seconds Nails hypertrophic, dystrophic, elongated, brittle, discolored 10. There is tenderness overlying the nails 1-5 bilaterally. There is no surrounding erythema or drainage along the nail sites. Thick hyperkeratotic lesion on the dorsal lateral aspect of the fifth toe just adjacent to the toenail on the right side causing pain.  Upon debridement there is no ongoing ulceration drainage or signs of infection. No open lesions or pre-ulcerative lesions are identified. No other areas of tenderness bilateral lower extremities. No overlying edema, erythema, increased warmth. No pain with calf compression, swelling, warmth, erythema.  Assessment: Patient presents with symptomatic onychomycosis; hyperkeratotic lesions  Plan: -Treatment options including alternatives, risks, complications were discussed -Nails sharply debrided 10 without complication/bleeding. -Hyperkeratotic lesion sharply debrided x2 without complications or bleeding -Discussed daily foot inspection. If there are any changes, to call the office immediately.  -Follow-up in 3 months or sooner if any problems are to arise. In the meantime, encouraged to call the office with any questions, concerns, changes symptoms.  Celesta Gentile, DPM

## 2019-02-28 IMAGING — CR DG CHEST 2V
2 series · 2 of 2 positions shown · non-contrast
Comparison: None.

CLINICAL DATA: Fever, hypertension and headache.

EXAM:
CHEST  2 VIEW

[chest lat]
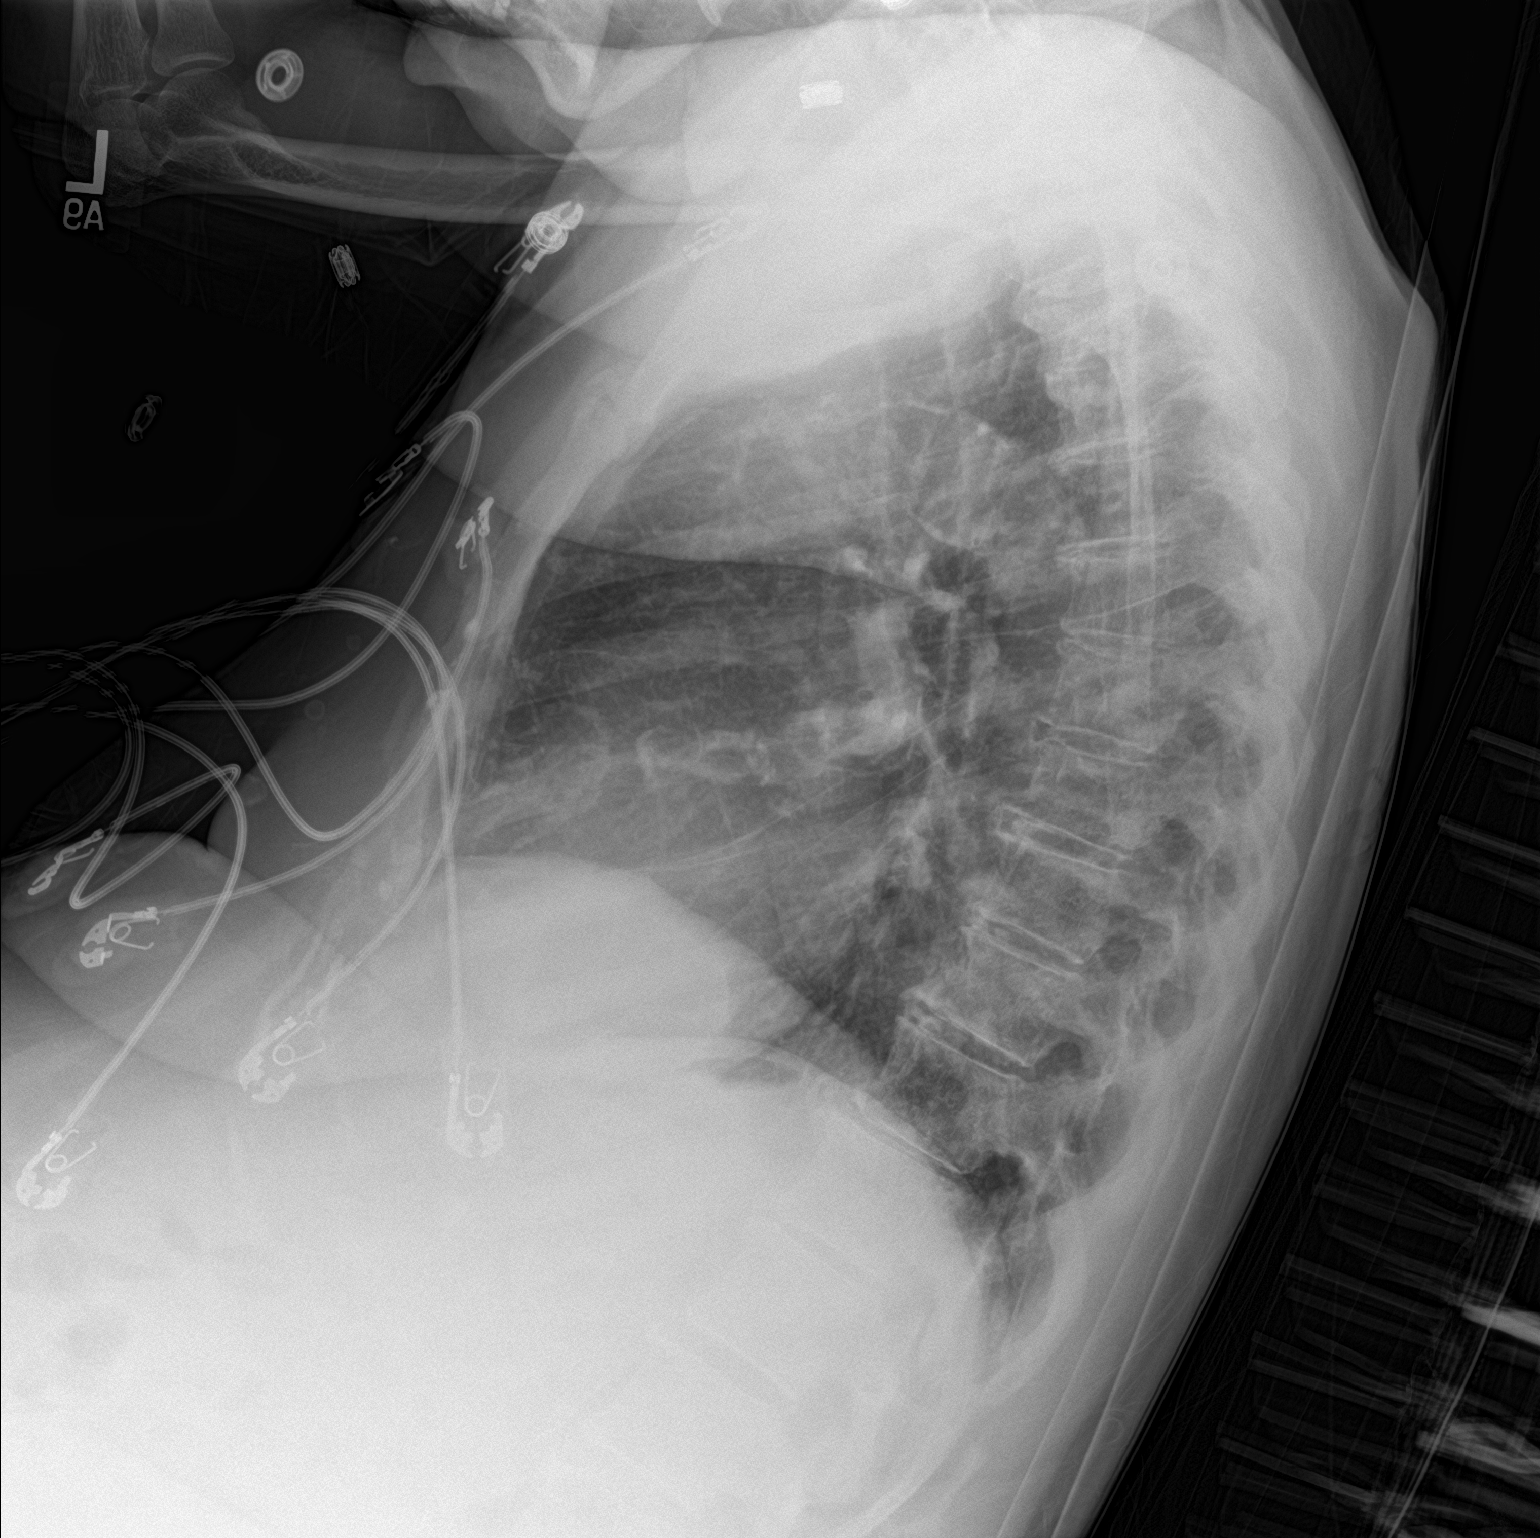

[chest ap]
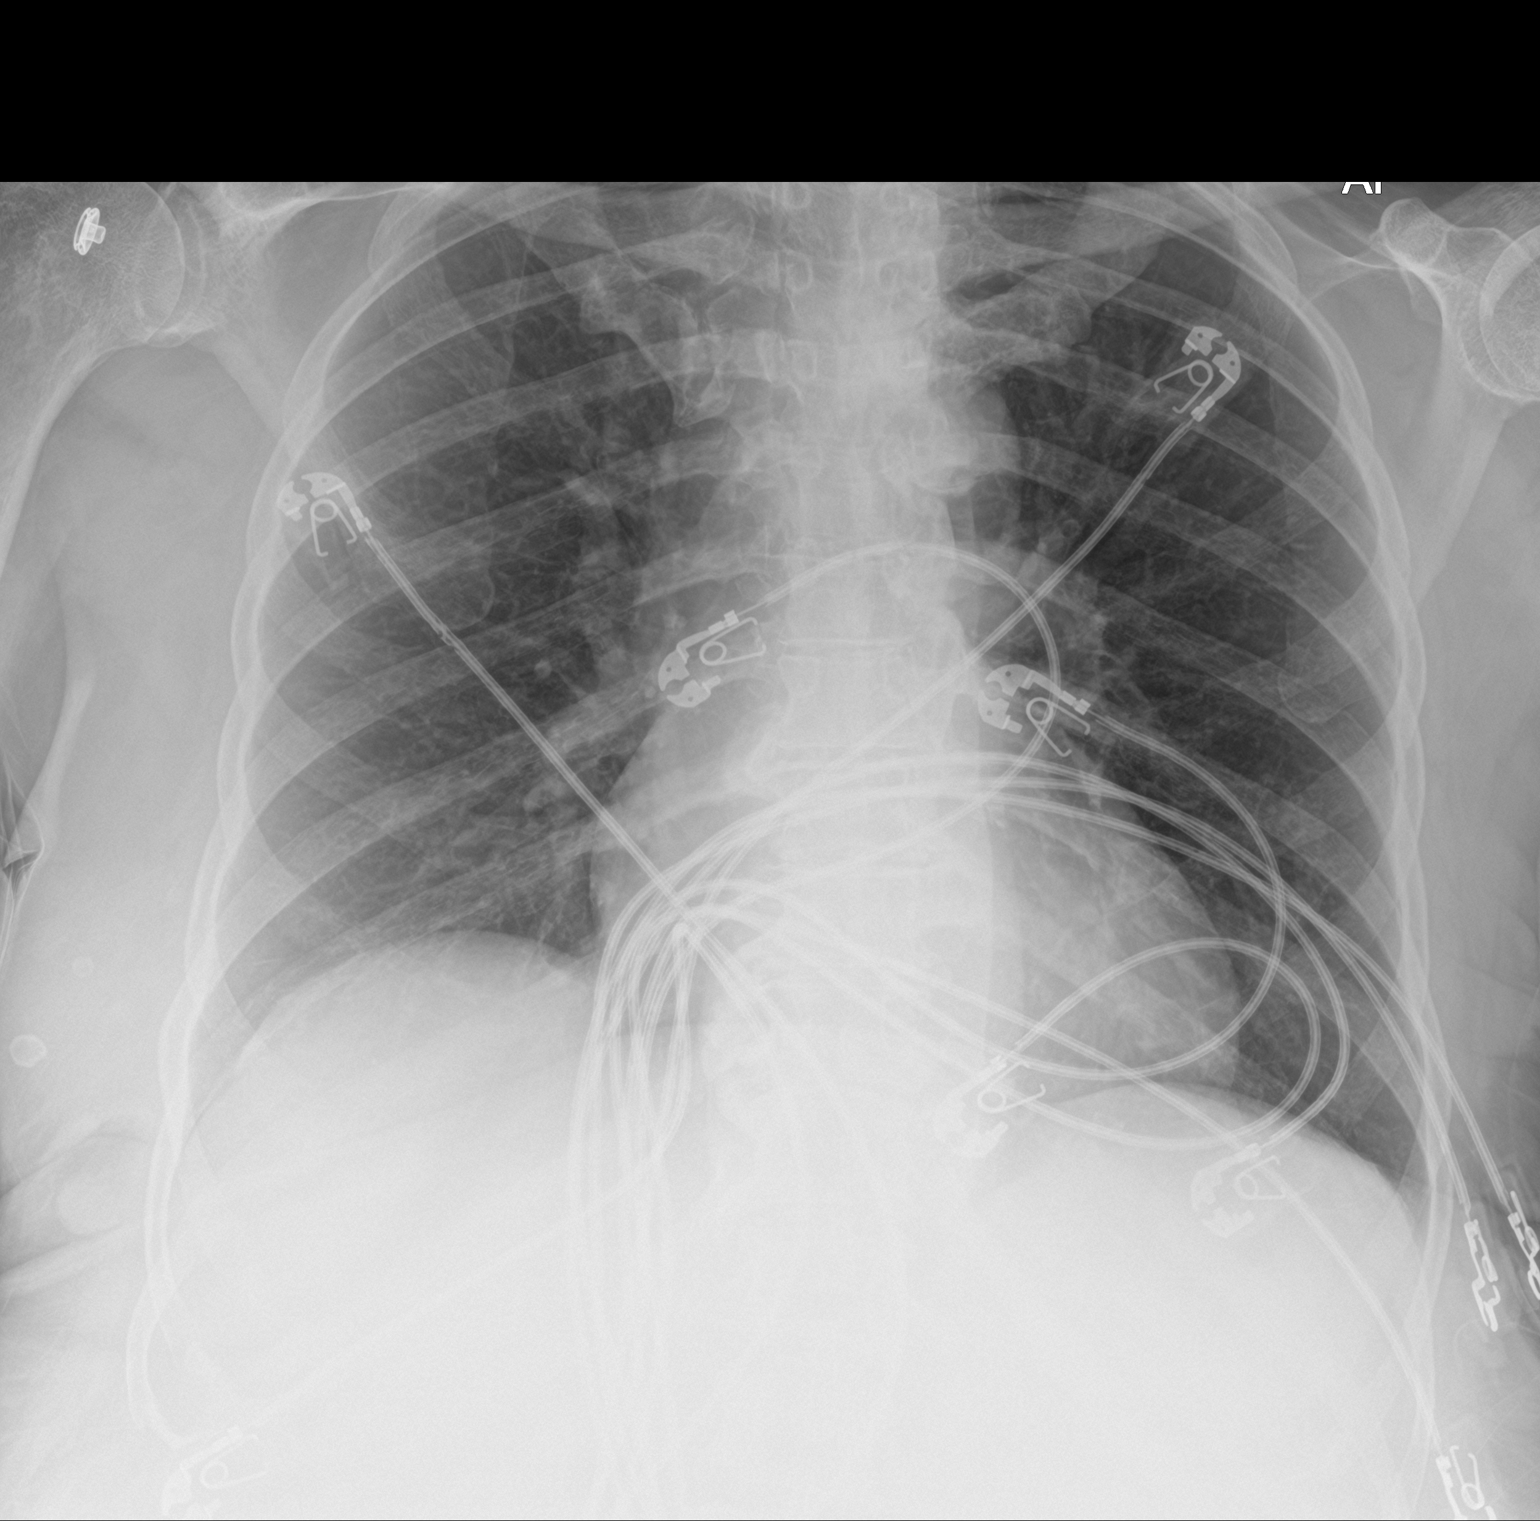

[2 of 2 positions shown; findings below may reference images not displayed]

FINDINGS: The cardiomediastinal silhouette is unremarkable.

There is no evidence of focal airspace disease, pulmonary edema,
suspicious pulmonary nodule/mass, pleural effusion, or pneumothorax.

No acute bony abnormalities are identified.
IMPRESSION: No active cardiopulmonary disease.

## 2019-03-16 ENCOUNTER — Other Ambulatory Visit: Payer: Self-pay | Admitting: Family Medicine

## 2019-04-22 ENCOUNTER — Other Ambulatory Visit: Payer: Self-pay

## 2019-04-22 ENCOUNTER — Ambulatory Visit (INDEPENDENT_AMBULATORY_CARE_PROVIDER_SITE_OTHER): Payer: Medicare Other | Admitting: Podiatry

## 2019-04-22 ENCOUNTER — Encounter: Payer: Self-pay | Admitting: Podiatry

## 2019-04-22 DIAGNOSIS — Q828 Other specified congenital malformations of skin: Secondary | ICD-10-CM | POA: Diagnosis not present

## 2019-04-22 DIAGNOSIS — E119 Type 2 diabetes mellitus without complications: Secondary | ICD-10-CM

## 2019-04-22 DIAGNOSIS — B351 Tinea unguium: Secondary | ICD-10-CM

## 2019-04-22 DIAGNOSIS — M79674 Pain in right toe(s): Secondary | ICD-10-CM | POA: Diagnosis not present

## 2019-04-22 DIAGNOSIS — M79675 Pain in left toe(s): Secondary | ICD-10-CM

## 2019-04-27 NOTE — Progress Notes (Signed)
Subjective: 73 y.o. returns the office today for painful, elongated, thickened toenails which she cannot trim herself as well as for painful callus on her right fifth toe as well as the heels. Denies any redness or drainage around the nails or callus sites. Denies any acute changes since last appointment and no new complaints today. Denies any systemic complaints such as fevers, chills, nausea, vomiting.   PCP: Billie Ruddy, MD   Objective: AAO 3, NAD DP/PT pulses palpable, CRT less than 3 seconds Nails hypertrophic, dystrophic, elongated, brittle, discolored 10. There is tenderness overlying the nails 1-5 bilaterally. There is no surrounding erythema or drainage along the nail sites. Thick hyperkeratotic lesion on the dorsal lateral aspect of the fifth toe just adjacent to the toenail on the right side causing pain.  Also hyperkeratotic lesions bilateral heels with the right side worse than left.  Upon debridement there is no ongoing ulceration drainage or signs of infection. No open lesions or pre-ulcerative lesions are identified. No other areas of tenderness bilateral lower extremities. No overlying edema, erythema, increased warmth. No pain with calf compression, swelling, warmth, erythema.  Assessment: Patient presents with symptomatic onychomycosis; hyperkeratotic lesions  Plan: -Treatment options including alternatives, risks, complications were discussed -Nails sharply debrided 10 without complication/bleeding. -Hyperkeratotic lesion sharply debrided x4 without complications or bleeding -Discussed daily foot inspection. If there are any changes, to call the office immediately.  -Follow-up in 3 months or sooner if any problems are to arise. In the meantime, encouraged to call the office with any questions, concerns, changes symptoms.  Celesta Gentile, DPM

## 2019-05-04 DIAGNOSIS — Z23 Encounter for immunization: Secondary | ICD-10-CM | POA: Diagnosis not present

## 2019-05-18 DIAGNOSIS — E119 Type 2 diabetes mellitus without complications: Secondary | ICD-10-CM | POA: Diagnosis not present

## 2019-05-23 ENCOUNTER — Other Ambulatory Visit: Payer: Self-pay | Admitting: Family Medicine

## 2019-05-30 ENCOUNTER — Telehealth: Payer: Self-pay

## 2019-05-30 NOTE — Telephone Encounter (Signed)
Copied from [place] [id number]. Topic: Referral - Request for Referral >> May 30, 2019  1:36 PM Alanda Slim E wrote: Has patient seen PCP for this complaint? Yes  *If NO, is insurance requiring patient see PCP for this issue before PCP can refer them? Referral for which specialty: Gertie Fey  Preferred provider/office: Dr. Earlean Shawl [facility] Fax# [phone] Reason for referral: Colonoscopy / Pt needs a referral since it has a while since they have seen her/ please advise

## 2019-05-30 NOTE — Telephone Encounter (Signed)
Ok for referral?

## 2019-05-31 DIAGNOSIS — Z853 Personal history of malignant neoplasm of breast: Secondary | ICD-10-CM | POA: Diagnosis not present

## 2019-05-31 DIAGNOSIS — Z1231 Encounter for screening mammogram for malignant neoplasm of breast: Secondary | ICD-10-CM | POA: Diagnosis not present

## 2019-05-31 LAB — HM MAMMOGRAPHY

## 2019-05-31 NOTE — Telephone Encounter (Signed)
ok 

## 2019-06-01 ENCOUNTER — Other Ambulatory Visit: Payer: Self-pay

## 2019-06-01 DIAGNOSIS — Z1211 Encounter for screening for malignant neoplasm of colon: Secondary | ICD-10-CM

## 2019-06-01 NOTE — Telephone Encounter (Signed)
Referral has been placed. 

## 2019-06-06 ENCOUNTER — Telehealth: Payer: Self-pay | Admitting: *Deleted

## 2019-06-06 NOTE — Telephone Encounter (Signed)
Copied from [place] [id number]. Topic: General - Other >> Jun 06, 2019  4:34 PM Rainey Pines A wrote: Patients husband feels that patient may have an UTI and is requesting to speak with Dr. Volanda Napoleon nurse. Please advise

## 2019-06-07 ENCOUNTER — Telehealth (INDEPENDENT_AMBULATORY_CARE_PROVIDER_SITE_OTHER): Payer: Medicare Other | Admitting: Family Medicine

## 2019-06-07 DIAGNOSIS — M545 Low back pain, unspecified: Secondary | ICD-10-CM

## 2019-06-07 DIAGNOSIS — R350 Frequency of micturition: Secondary | ICD-10-CM | POA: Diagnosis not present

## 2019-06-07 DIAGNOSIS — E1129 Type 2 diabetes mellitus with other diabetic kidney complication: Secondary | ICD-10-CM | POA: Diagnosis not present

## 2019-06-07 NOTE — Telephone Encounter (Signed)
Pt was seen this afternoon by Dr Volanda Napoleon and has a lab appointment in the morning

## 2019-06-07 NOTE — Progress Notes (Signed)
Virtual Visit via Telephone Note  I connected with Anita Mccormick on 06/07/19 at  2:00 PM EST by telephone and verified that I am speaking with the correct person using two identifiers.   I discussed the limitations, risks, security and privacy concerns of performing an evaluation and management service by telephone and the availability of in person appointments. I also discussed with the patient that there may be a patient responsible charge related to this service. The patient expressed understanding and agreed to proceed.  Location patient: home Location provider: work or home office Participants present for the call: patient, provider Patient did not have a visit in the prior 7 days to address this/these issue(s).   History of Present Illness: Pt having b/l low back pain "where your kidneys are", frequency since last wk.  Denies dysuria, fever, chills. Feels similar to previous UTI.  Pt tried to get cranberry juice, but her husband got diet Cranberry/Mango juice which has not helped.    Pt states she has not been checking her fsbs. She stopped checking it daily as it has been around 122 each time she checks it and her fingers were sore.  Pt taking trulicity 1/5 mg q Thursday, glipizide XL 10 mg BID.  Pt had issues with affording meds in the past 2/2 being in the donut hole.  Pt had a mammogram recent.  Colonoscopy    Observations/Objective: Patient sounds cheerful and well on the phone. I do not appreciate any SOB. Speech and thought processing are grossly intact. Patient reported vitals:  Assessment and Plan: Acute bilateral low back pain without sciatica  -pt to come by clinic for Urine sample.  - Plan: POC Urinalysis Dipstick  Urinary frequency -pt to provide urine sample. -based on UA will rx abx if needed. -pt encouraged ot increase po intake of fluids. - Plan: POC Urinalysis Dipstick, Culture, Urine  Type 2 diabetes mellitus with other diabetic kidney complication,  without long-term current use of insulin ([place]) -pt encouraged to check fsbs more frequently  -continue trulicity 1.5 mg weekly and Glipizide XL 10 mg BID -discussed lifestyle modifications -will obtain hgb A1c -Plan: Microalbumin/Creatinine ratio   Follow Up Instructions: F/u prn  I did not refer this patient for an OV in the next 24 hours for this/these issue(s).  I discussed the assessment and treatment plan with the patient. The patient was provided an opportunity to ask questions and all were answered. The patient agreed with the plan and demonstrated an understanding of the instructions.   The patient was advised to call back or seek an in-person evaluation if the symptoms worsen or if the condition fails to improve as anticipated.  I provided 10 minutes and 30 seconds of non-face-to-face time during this encounter.   Billie Ruddy, MD

## 2019-06-08 ENCOUNTER — Other Ambulatory Visit: Payer: Self-pay

## 2019-06-08 ENCOUNTER — Other Ambulatory Visit (INDEPENDENT_AMBULATORY_CARE_PROVIDER_SITE_OTHER): Payer: Medicare Other

## 2019-06-08 DIAGNOSIS — M545 Low back pain, unspecified: Secondary | ICD-10-CM

## 2019-06-08 DIAGNOSIS — R350 Frequency of micturition: Secondary | ICD-10-CM | POA: Diagnosis not present

## 2019-06-08 DIAGNOSIS — E1129 Type 2 diabetes mellitus with other diabetic kidney complication: Secondary | ICD-10-CM | POA: Diagnosis not present

## 2019-06-08 LAB — POCT URINALYSIS DIPSTICK
Bilirubin, UA: NEGATIVE
Blood, UA: NEGATIVE
Glucose, UA: NEGATIVE
Ketones, UA: NEGATIVE
Leukocytes, UA: NEGATIVE
Nitrite, UA: NEGATIVE
Protein, UA: NEGATIVE
Spec Grav, UA: 1.025 (ref 1.010–1.025)
Urobilinogen, UA: 0.2 E.U./dL
pH, UA: 6 (ref 5.0–8.0)

## 2019-06-08 LAB — MICROALBUMIN / CREATININE URINE RATIO
Creatinine,U: 138.5 mg/dL
Microalb Creat Ratio: 0.5 mg/g (ref 0.0–30.0)
Microalb, Ur: <0.7 mg/dL (ref 0.0–1.9)

## 2019-06-08 LAB — POCT GLYCOSYLATED HEMOGLOBIN (HGB A1C): Hemoglobin A1C: 6.1 % — AB (ref 4.0–5.6)

## 2019-06-10 LAB — URINE CULTURE
MICRO NUMBER:: 1089292
SPECIMEN QUALITY:: ADEQUATE

## 2019-06-22 DIAGNOSIS — Z86010 Personal history of colonic polyps: Secondary | ICD-10-CM | POA: Diagnosis not present

## 2019-06-22 DIAGNOSIS — K648 Other hemorrhoids: Secondary | ICD-10-CM | POA: Insufficient documentation

## 2019-06-30 ENCOUNTER — Other Ambulatory Visit: Payer: Self-pay

## 2019-06-30 ENCOUNTER — Ambulatory Visit (INDEPENDENT_AMBULATORY_CARE_PROVIDER_SITE_OTHER): Payer: Medicare Other | Admitting: Podiatry

## 2019-06-30 DIAGNOSIS — B351 Tinea unguium: Secondary | ICD-10-CM | POA: Diagnosis not present

## 2019-06-30 DIAGNOSIS — Q828 Other specified congenital malformations of skin: Secondary | ICD-10-CM | POA: Diagnosis not present

## 2019-06-30 DIAGNOSIS — E119 Type 2 diabetes mellitus without complications: Secondary | ICD-10-CM | POA: Diagnosis not present

## 2019-06-30 DIAGNOSIS — M79674 Pain in right toe(s): Secondary | ICD-10-CM | POA: Diagnosis not present

## 2019-06-30 DIAGNOSIS — M79675 Pain in left toe(s): Secondary | ICD-10-CM | POA: Diagnosis not present

## 2019-06-30 NOTE — Progress Notes (Signed)
Subjective: 73 y.o. returns the office today for painful, elongated, thickened toenails which she cannot trim herself as well as for painful callus on her right fifth toe as well as the heels. Denies any redness or drainage around the nails or callus sites. Denies any acute changes since last appointment and no new complaints today. Denies any systemic complaints such as fevers, chills, nausea, vomiting.   PCP: Billie Ruddy, MD   Objective: AAO 3, NAD DP/PT pulses palpable, CRT less than 3 seconds Nails hypertrophic, dystrophic, elongated, brittle, discolored 10. There is tenderness overlying the nails 1-5 bilaterally. There is no surrounding erythema or drainage along the nail sites. Thick hyperkeratotic lesion on the dorsal lateral aspect of the fifth toe just adjacent to the toenail on the right and leftside causing pain.  Also hyperkeratotic lesions bilateral heels with the right side worse than left.  Upon debridement there is no ongoing ulceration drainage or signs of infection. No open lesions or pre-ulcerative lesions are identified. No other areas of tenderness bilateral lower extremities. No overlying edema, erythema, increased warmth. No pain with calf compression, swelling, warmth, erythema.  Assessment: Patient presents with symptomatic onychomycosis; hyperkeratotic lesions  Plan: -Treatment options including alternatives, risks, complications were discussed -Nails sharply debrided 10 without complication/bleeding. -Hyperkeratotic lesion sharply debrided x4 without complications or bleeding -Discussed daily foot inspection. If there are any changes, to call the office immediately.  -Follow-up in 3 months or sooner if any problems are to arise. In the meantime, encouraged to call the office with any questions, concerns, changes symptoms.  Celesta Gentile, DPM

## 2019-07-01 ENCOUNTER — Ambulatory Visit: Payer: Medicare Other | Admitting: Podiatry

## 2019-08-02 ENCOUNTER — Other Ambulatory Visit: Payer: Self-pay | Admitting: Family Medicine

## 2019-08-02 DIAGNOSIS — E1129 Type 2 diabetes mellitus with other diabetic kidney complication: Secondary | ICD-10-CM

## 2019-08-05 ENCOUNTER — Encounter: Payer: Self-pay | Admitting: Family Medicine

## 2019-08-22 ENCOUNTER — Encounter: Payer: Self-pay | Admitting: Family Medicine

## 2019-08-22 DIAGNOSIS — K621 Rectal polyp: Secondary | ICD-10-CM | POA: Diagnosis not present

## 2019-08-22 DIAGNOSIS — K635 Polyp of colon: Secondary | ICD-10-CM | POA: Diagnosis not present

## 2019-08-22 DIAGNOSIS — D128 Benign neoplasm of rectum: Secondary | ICD-10-CM | POA: Diagnosis not present

## 2019-08-22 DIAGNOSIS — K648 Other hemorrhoids: Secondary | ICD-10-CM | POA: Diagnosis not present

## 2019-08-22 DIAGNOSIS — Z86010 Personal history of colonic polyps: Secondary | ICD-10-CM | POA: Diagnosis not present

## 2019-08-22 DIAGNOSIS — D124 Benign neoplasm of descending colon: Secondary | ICD-10-CM | POA: Diagnosis not present

## 2019-08-22 DIAGNOSIS — Z1211 Encounter for screening for malignant neoplasm of colon: Secondary | ICD-10-CM | POA: Diagnosis not present

## 2019-09-05 ENCOUNTER — Ambulatory Visit (INDEPENDENT_AMBULATORY_CARE_PROVIDER_SITE_OTHER): Payer: Medicare Other | Admitting: Podiatry

## 2019-09-05 ENCOUNTER — Other Ambulatory Visit: Payer: Self-pay

## 2019-09-05 ENCOUNTER — Encounter: Payer: Self-pay | Admitting: Podiatry

## 2019-09-05 DIAGNOSIS — E119 Type 2 diabetes mellitus without complications: Secondary | ICD-10-CM | POA: Diagnosis not present

## 2019-09-05 DIAGNOSIS — M79675 Pain in left toe(s): Secondary | ICD-10-CM

## 2019-09-05 DIAGNOSIS — B351 Tinea unguium: Secondary | ICD-10-CM | POA: Diagnosis not present

## 2019-09-05 DIAGNOSIS — Q828 Other specified congenital malformations of skin: Secondary | ICD-10-CM | POA: Diagnosis not present

## 2019-09-05 DIAGNOSIS — M79674 Pain in right toe(s): Secondary | ICD-10-CM

## 2019-09-06 NOTE — Progress Notes (Signed)
Subjective: 74 y.o. returns the office today for painful, elongated, thickened toenails which she cannot trim herself as well as for painful callus on her right fifth toe as well as the heels. She has been using a pediegg on the foot and the calluses on the heels are improving. Denies any redness or drainage around the nails or callus sites. Denies any acute changes since last appointment and no new complaints today. Denies any systemic complaints such as fevers, chills, nausea, vomiting.   PCP: Billie Ruddy, MD   Objective: AAO 3, NAD DP/PT pulses palpable, CRT less than 3 seconds Nails hypertrophic, dystrophic, elongated, brittle, discolored 10. There is tenderness overlying the nails 1-5 bilaterally. There is no surrounding erythema or drainage along the nail sites. Thick hyperkeratotic lesion on the dorsal lateral aspect of the fifth toe just adjacent to the toenail on the right and left side causing pain as well as hyperkeratotic lesions bilateral heels with the right side worse than left and left sub 5.  Upon debridement there is no ongoing ulceration drainage or signs of infection. No open lesions or pre-ulcerative lesions are identified. No other areas of tenderness bilateral lower extremities. No overlying edema, erythema, increased warmth. No pain with calf compression, swelling, warmth, erythema.  Assessment: Patient presents with symptomatic onychomycosis; hyperkeratotic lesions  Plan: -Treatment options including alternatives, risks, complications were discussed -Nails sharply debrided 10 without complication/bleeding. -Hyperkeratotic lesion sharply debrided x4 without complications or bleeding. Recommended not to use the pedegg but can apply moisturizer daily.  -Discussed daily foot inspection. If there are any changes, to call the office immediately.  -Follow-up as scheduled or sooner if any problems are to arise. In the meantime, encouraged to call the office with any  questions, concerns, changes symptoms.  Celesta Gentile, DPM

## 2019-09-25 ENCOUNTER — Ambulatory Visit: Payer: Medicare Other | Attending: Internal Medicine

## 2019-09-25 DIAGNOSIS — Z23 Encounter for immunization: Secondary | ICD-10-CM

## 2019-09-25 NOTE — Progress Notes (Signed)
   Covid-19 Vaccination Clinic  Name:  Anita Mccormick    MRN: [id number] DOB: [birth year]  09/25/2019  [name] was observed post Covid-19 immunization for 15 minutes without incidence. She was provided with Vaccine Information Sheet and instruction to access the V-Safe system.   Ms. Ramp was instructed to call 911 with any severe reactions post vaccine: [name] Difficulty breathing  . Swelling of your face and throat  . A fast heartbeat  . A bad rash all over your body  . Dizziness and weakness    Immunizations Administered    Name Date Dose VIS Date Route   Pfizer COVID-19 Vaccine 09/25/2019  4:14 PM 0.3 mL 07/08/2019 Intramuscular   Manufacturer: [place]   Lot: [id number]   [place]: [id number]

## 2019-10-24 ENCOUNTER — Other Ambulatory Visit: Payer: Self-pay | Admitting: Family Medicine

## 2019-10-25 ENCOUNTER — Ambulatory Visit: Payer: Medicare Other | Attending: Internal Medicine

## 2019-10-25 DIAGNOSIS — Z23 Encounter for immunization: Secondary | ICD-10-CM

## 2019-10-25 NOTE — Progress Notes (Signed)
   Covid-19 Vaccination Clinic  Name:  Anita Mccormick    MRN: [id number] DOB: 08/11/1945  10/25/2019  [name] was observed post Covid-19 immunization for 15 minutes without incident. She was provided with Vaccine Information Sheet and instruction to access the V-Safe system.   [name] was instructed to call 911 with any severe reactions post vaccine: [name] Difficulty breathing  . Swelling of face and throat  . A fast heartbeat  . A bad rash all over body  . Dizziness and weakness   Immunizations Administered    Name Date Dose VIS Date Route   Pfizer COVID-19 Vaccine 10/25/2019  3:24 PM 0.3 mL 07/08/2019 Intramuscular   Manufacturer: [place]   Lot: [id number]   [place]: [id number]

## 2019-11-03 ENCOUNTER — Ambulatory Visit: Payer: Medicare Other | Admitting: Podiatry

## 2019-11-30 ENCOUNTER — Telehealth: Payer: Self-pay | Admitting: Family Medicine

## 2019-11-30 NOTE — Telephone Encounter (Signed)
Pt's spouse, Rimsha Klink (On DPR), states she falls a lot (up to 3-4 times a week) and weak. He says it is hard for her to keep her balance when she walks. He would like for her PCP to call her at [phone]   He is not sure if she wants to evaluate her in person or not so he would like for her to call her.

## 2019-11-30 NOTE — Telephone Encounter (Signed)
FYI Spoke with pt husband on pt  DPR state that pt has had frequent falls and he is concerned, spoke with pt and advised pt that she needs an office visit with Dr Volanda Napoleon. Pt agreed and is scheduled for 12/01/2019 at 3.30 pm

## 2019-12-01 ENCOUNTER — Ambulatory Visit (INDEPENDENT_AMBULATORY_CARE_PROVIDER_SITE_OTHER): Payer: Medicare Other | Admitting: Family Medicine

## 2019-12-01 ENCOUNTER — Other Ambulatory Visit: Payer: Self-pay

## 2019-12-01 ENCOUNTER — Encounter: Payer: Self-pay | Admitting: Family Medicine

## 2019-12-01 ENCOUNTER — Ambulatory Visit (INDEPENDENT_AMBULATORY_CARE_PROVIDER_SITE_OTHER)
Admission: RE | Admit: 2019-12-01 | Discharge: 2019-12-01 | Disposition: A | Discharge status: Home / Self Care | Payer: Medicare Other | Source: Ambulatory Visit | Attending: Family Medicine | Admitting: Family Medicine

## 2019-12-01 VITALS — BP 138/80 | HR 80 | Temp 96.7°F | Wt 150.0 lb

## 2019-12-01 DIAGNOSIS — E1129 Type 2 diabetes mellitus with other diabetic kidney complication: Secondary | ICD-10-CM | POA: Diagnosis not present

## 2019-12-01 DIAGNOSIS — M25552 Pain in left hip: Secondary | ICD-10-CM

## 2019-12-01 DIAGNOSIS — S79912A Unspecified injury of left hip, initial encounter: Secondary | ICD-10-CM | POA: Diagnosis not present

## 2019-12-01 LAB — POCT GLYCOSYLATED HEMOGLOBIN (HGB A1C): Hemoglobin A1C: 6 % — AB (ref 4.0–5.6)

## 2019-12-01 LAB — POCT GLUCOSE (DEVICE FOR HOME USE): Glucose Fasting, POC: 217 mg/dL — AB (ref 70–99)

## 2019-12-01 NOTE — Progress Notes (Signed)
Subjective:    Patient ID: Anita Mccormick, female    DOB: 25-Oct-1945, 74 y.o.   MRN: [id number]  No chief complaint on file.   HPI Patient was seen today for acute concern.  Pt endorses L hip pain radiating into L knee causing her to fall yesterday.  Pt states L leg felt like it was giving out and she fell 3 x yesterday.  Pt did not hit her head or have LOC.  Pt was helped up by her son and reports soreness in thighs from trying to "help him" get her up.  Pt states blood sugar has been doing well.  Taking Trulicity 1.5 mg weekly and glipizide XL 10 mg daily.  Patient denies lower extremity edema.  Not currently taking Lasix or potassium chloride.  Past Medical History:  Diagnosis Date  . Cancer [facility])    history of no adjunctive RX( no chemo no RT)  . Cerebrovascular accident ([place])   . COLONIC POLYPS, HX OF 12/10/2006   Qualifier: Diagnosis of  By: Leanne Chang MD, Bruce    . Diabetes mellitus    Type 2  . Diabetes mellitus type II, controlled ([place]) 12/10/2006   Poor control on Januvia 100mg  and glipizide 10mg  XL Lab Results  Component Value Date   HGBA1C 8.3* 11/02/2014       . Eczema   . Fatty liver disease, nonalcoholic 123456   Per Dr. Leanne Chang Lab Results  Component Value Date   ALT 31 11/02/2014   AST 57* 11/02/2014   ALKPHOS 104 11/02/2014   BILITOT 1.1 11/02/2014      . Hypertension     Allergies  Allergen Reactions  . Ace Inhibitors Cough    ????  . Chlorthalidone     REACTION: unspecified  . Metformin     REACTION: gi side effects  . Penicillins     REACTION: urticaria (hives)  . Sulfamethoxazole     REACTION: questionable    ROS General: Denies fever, chills, night sweats, changes in weight, changes in appetite HEENT: Denies headaches, ear pain, changes in vision, rhinorrhea, sore throat CV: Denies CP, palpitations, SOB, orthopnea Pulm: Denies SOB, cough, wheezing GI: Denies abdominal pain, nausea, vomiting, diarrhea, constipation GU: Denies dysuria, hematuria,  frequency, vaginal discharge Msk: Denies muscle cramps, joint pains  +L hip/leg pain Neuro: Denies weakness, numbness, tingling Skin: Denies rashes, bruising Psych: Denies depression, anxiety, hallucinations     Objective:    Blood pressure 138/80, pulse 80, temperature (!) 96.7 F (35.9 C), temperature source Temporal, weight 150 lb (68 kg), SpO2 98 %.   Gen. Pleasant, well-nourished, in no distress, normal affect   HEENT: [place]/AT, face symmetric, no scleral icterus, PERRLA, EOMI, nares patent without drainage Lungs: no accessory muscle use, no wheezes or rales Cardiovascular: RRR, no peripheral edema Musculoskeletal: No TTP of cervical, thoracic, lumbar, or paraspinal muscles.  TTP of the left hip.  Negative logroll, straight leg raise, positive FADIR, negative FABER.  No TTP of right hip.  Negative logroll, straight leg raise, FADIR, FABER of right hip.  Normal knees. no deformities, no cyanosis or clubbing, normal tone Neuro:  A&Ox3, CN II-XII intact, normal gait Skin:  Warm, no lesions/ rash   Wt Readings from Last 3 Encounters:  12/01/19 150 lb (68 kg)  06/01/18 151 lb 14.4 oz (68.9 kg)  02/20/18 145 lb (65.8 kg)    Lab Results  Component Value Date   WBC 5.7 11/17/2017   HGB 8.5 (L) 11/17/2017   HCT 27.1 (  L) 11/17/2017   PLT 130 (L) 11/17/2017   GLUCOSE 351 (H) 11/16/2017   CHOL 146 03/18/2017   TRIG 154.0 (H) 03/18/2017   HDL 49.80 03/18/2017   LDLDIRECT 95.8 07/02/2006   LDLCALC 65 03/18/2017   ALT 25 09/19/2017   AST 68 (H) 09/19/2017   NA 135 11/16/2017   K 4.0 11/16/2017   CL 101 11/16/2017   CREATININE 0.98 11/16/2017   BUN 11 11/16/2017   CO2 23 11/16/2017   TSH 1.32 04/11/2014   INR 1.0 10/18/2007   HGBA1C 6.0 (A) 12/01/2019   MICROALBUR <0.7 06/08/2019    Assessment/Plan:  Type 2 diabetes mellitus with other diabetic kidney complication, without long-term current use of insulin ([facility])  -Hemoglobin A1c 6.0% this visit -FSBS 217 -Continue  glipizide XL 10 mg daily and Trulicity 1.5 mg -Lifestyle modification strongly encouraged -Continue checking FSBS at home - Plan: POCT Glucose (Device for Home Use), POCT glycosylated hemoglobin (Hb A1C)  Hip pain, acute, left  -Discussed possible causes including arthritis -Discussed Voltaren gel prn and other supportive care -Further treatment based on imaging -Consider Ortho referral if needed - Plan: DG Hip Unilat W oR W/O Pelvis 2-3 Views Left  F/u as needed  [name], MD

## 2019-12-15 ENCOUNTER — Other Ambulatory Visit: Payer: Self-pay | Admitting: Family Medicine

## 2020-02-21 ENCOUNTER — Encounter: Payer: Self-pay | Admitting: Podiatry

## 2020-02-21 ENCOUNTER — Other Ambulatory Visit: Payer: Self-pay

## 2020-02-21 ENCOUNTER — Ambulatory Visit: Payer: Medicare Other | Admitting: Podiatry

## 2020-02-21 DIAGNOSIS — M79674 Pain in right toe(s): Secondary | ICD-10-CM

## 2020-02-21 DIAGNOSIS — Q828 Other specified congenital malformations of skin: Secondary | ICD-10-CM

## 2020-02-21 DIAGNOSIS — B351 Tinea unguium: Secondary | ICD-10-CM | POA: Diagnosis not present

## 2020-02-21 DIAGNOSIS — E119 Type 2 diabetes mellitus without complications: Secondary | ICD-10-CM

## 2020-02-21 DIAGNOSIS — M79675 Pain in left toe(s): Secondary | ICD-10-CM | POA: Diagnosis not present

## 2020-02-21 NOTE — Progress Notes (Signed)
Subjective: 74 y.o. returns the office today for painful, elongated, thickened toenails which she cannot trim herself as well as for painful callus on her right fifth toe as well as the left heels.  Denies any open lesions.  Denies any acute changes since last appointment and no new complaints today. Denies any systemic complaints such as fevers, chills, nausea, vomiting.   PCP: Billie Ruddy, MD   Objective: AAO 3, NAD DP/PT pulses palpable, CRT less than 3 seconds Nails hypertrophic, dystrophic, elongated, brittle, discolored 10. There is tenderness overlying the nails 1-5 bilaterally. There is no surrounding erythema or drainage along the nail sites. Hyperkeratotic lesion on the dorsal lateral aspect of the fifth toe just adjacent to the toenail on the right and left side and the left heel without any underlying ulceration drainage or signs of infection. Hammertoes evident. No pain with calf compression, swelling, warmth, erythema.  Assessment: Patient presents with symptomatic onychomycosis; hyperkeratotic lesions  Plan: -Treatment options including alternatives, risks, complications were discussed -Nails sharply debrided 10 without complication/bleeding. -Hyperkeratotic lesion sharply debrided x3 without complications or bleeding.  Recommended to continue with moisturizer daily.  -Discussed daily foot inspection. If there are any changes, to call the office immediately.  -Follow-up as scheduled or sooner if any problems are to arise. In the meantime, encouraged to call the office with any questions, concerns, changes symptoms.  Celesta Gentile, DPM

## 2020-02-22 ENCOUNTER — Telehealth: Payer: Self-pay | Admitting: Family Medicine

## 2020-02-22 NOTE — Telephone Encounter (Signed)
Spoke with patient she declined the AWV visit at this time.  Will call back later this year

## 2020-02-27 ENCOUNTER — Telehealth: Payer: Self-pay

## 2020-02-27 DIAGNOSIS — M79662 Pain in left lower leg: Secondary | ICD-10-CM

## 2020-02-27 DIAGNOSIS — M79661 Pain in right lower leg: Secondary | ICD-10-CM

## 2020-02-27 NOTE — Telephone Encounter (Signed)
Pt called to follow up on a referral from Dr. Jacqualyn Posey. Please advise

## 2020-02-29 NOTE — Telephone Encounter (Signed)
Can you please order arterial and venous dopplers? Thanks.

## 2020-03-01 NOTE — Telephone Encounter (Signed)
I informed pt the orders had been given to me today and she would receive a call from [facility].

## 2020-03-01 NOTE — Telephone Encounter (Signed)
Leg pain

## 2020-03-01 NOTE — Telephone Encounter (Signed)
Faxed orders to [facility]. 

## 2020-03-02 ENCOUNTER — Other Ambulatory Visit: Payer: Self-pay

## 2020-03-02 ENCOUNTER — Ambulatory Visit (HOSPITAL_COMMUNITY)
Admission: RE | Admit: 2020-03-02 | Discharge: 2020-03-02 | Disposition: A | Discharge status: Home / Self Care | Payer: Medicare Other | Source: Ambulatory Visit | Attending: Cardiology | Admitting: Cardiology

## 2020-03-02 DIAGNOSIS — M79662 Pain in left lower leg: Secondary | ICD-10-CM | POA: Diagnosis not present

## 2020-03-02 DIAGNOSIS — M79661 Pain in right lower leg: Secondary | ICD-10-CM | POA: Diagnosis not present

## 2020-03-24 ENCOUNTER — Other Ambulatory Visit: Payer: Self-pay | Admitting: Family Medicine

## 2020-03-28 ENCOUNTER — Other Ambulatory Visit: Payer: Self-pay

## 2020-03-28 ENCOUNTER — Ambulatory Visit (HOSPITAL_COMMUNITY)
Admission: RE | Admit: 2020-03-28 | Discharge: 2020-03-28 | Disposition: A | Discharge status: Home / Self Care | Payer: Medicare Other | Source: Ambulatory Visit | Attending: Cardiovascular Disease | Admitting: Cardiovascular Disease

## 2020-03-28 DIAGNOSIS — M79661 Pain in right lower leg: Secondary | ICD-10-CM | POA: Diagnosis not present

## 2020-03-28 DIAGNOSIS — M79662 Pain in left lower leg: Secondary | ICD-10-CM | POA: Diagnosis not present

## 2020-04-16 ENCOUNTER — Telehealth: Payer: Self-pay | Admitting: Family Medicine

## 2020-04-16 NOTE — Telephone Encounter (Signed)
Anita Mccormick [facility] Pharmacist [phone]  They need a code renewed   Please advise

## 2020-04-16 NOTE — Telephone Encounter (Signed)
Returned a call for [employer] with [facility] pharmacist regarding pt state that she was in a meeting and that she will return my call after she is out of the meeting

## 2020-04-24 ENCOUNTER — Other Ambulatory Visit: Payer: Self-pay

## 2020-04-24 ENCOUNTER — Ambulatory Visit: Payer: Medicare Other | Admitting: Podiatry

## 2020-04-24 DIAGNOSIS — M79675 Pain in left toe(s): Secondary | ICD-10-CM

## 2020-04-24 DIAGNOSIS — E119 Type 2 diabetes mellitus without complications: Secondary | ICD-10-CM

## 2020-04-24 DIAGNOSIS — M79674 Pain in right toe(s): Secondary | ICD-10-CM | POA: Diagnosis not present

## 2020-04-24 DIAGNOSIS — M79662 Pain in left lower leg: Secondary | ICD-10-CM

## 2020-04-24 DIAGNOSIS — M79661 Pain in right lower leg: Secondary | ICD-10-CM | POA: Diagnosis not present

## 2020-04-24 DIAGNOSIS — B351 Tinea unguium: Secondary | ICD-10-CM

## 2020-04-24 DIAGNOSIS — Q828 Other specified congenital malformations of skin: Secondary | ICD-10-CM

## 2020-04-26 NOTE — Progress Notes (Signed)
Subjective: 74 y.o. returns the office today for painful, elongated, thickened toenails which she cannot trim herself.  Overall she states that she is doing better.  She does not have significant swelling to her legs.  Also presents today with her vascular studies.  She has no other concerns today. Denies any acute changes since last appointment and no new complaints today. Denies any systemic complaints such as fevers, chills, nausea, vomiting.   PCP: Billie Ruddy, MD  Objective: AAO 3, NAD DP/PT pulses palpable, CRT less than 3 seconds No significant edema to the legs.  Particular left calf there is no significant palpable mass identified.  There is no pain with calf compression, erythema or warmth. Nails hypertrophic, dystrophic, elongated, brittle, discolored 10. There is tenderness overlying the nails 1-5 bilaterally. There is no surrounding erythema or drainage along the nail sites. Hyperkeratotic lesion on the dorsal lateral aspect of the left and right fifth toe just adjacent to the toenail.  Hammertoes evident. No pain with calf compression, swelling, warmth, erythema.  Assessment: Patient presents with symptomatic onychomycosis; hyperkeratotic lesions  Plan: -Treatment options including alternatives, risks, complications were discussed -Nails sharply debrided 10 without complication/bleeding. -Hyperkeratotic lesion sharply debrided x2 without complications or bleeding.  Recommended to continue with moisturizer daily.  -Review circulation test.  Unable to palpate a knot in the left calf but will monitor this.  She states that she has other fatty masses in her body particular usually one on her hand.  We will monitor the left calf and there is any increase in size of pain to further evaluate this.  Also encouraged her to follow-up with her primary care physician.  Reduction arterial studies likely mild microvascular disease.  Will monitor.  No claudication symptoms  currently. -Discussed daily foot inspection. If there are any changes, to call the office immediately.  -Follow-up as scheduled or sooner if any problems are to arise. In the meantime, encouraged to call the office with any questions, concerns, changes symptoms.  Celesta Gentile, DPM

## 2020-05-10 ENCOUNTER — Encounter: Payer: Self-pay | Admitting: Family Medicine

## 2020-05-10 ENCOUNTER — Ambulatory Visit (INDEPENDENT_AMBULATORY_CARE_PROVIDER_SITE_OTHER): Payer: Medicare Other

## 2020-05-10 ENCOUNTER — Other Ambulatory Visit: Payer: Self-pay

## 2020-05-10 ENCOUNTER — Ambulatory Visit (INDEPENDENT_AMBULATORY_CARE_PROVIDER_SITE_OTHER): Payer: Medicare Other | Admitting: Family Medicine

## 2020-05-10 VITALS — BP 136/80 | HR 90 | Temp 97.9°F | Wt 152.0 lb

## 2020-05-10 DIAGNOSIS — E1129 Type 2 diabetes mellitus with other diabetic kidney complication: Secondary | ICD-10-CM

## 2020-05-10 DIAGNOSIS — F17200 Nicotine dependence, unspecified, uncomplicated: Secondary | ICD-10-CM | POA: Diagnosis not present

## 2020-05-10 DIAGNOSIS — R5383 Other fatigue: Secondary | ICD-10-CM | POA: Diagnosis not present

## 2020-05-10 DIAGNOSIS — K719 Toxic liver disease, unspecified: Secondary | ICD-10-CM

## 2020-05-10 DIAGNOSIS — R1012 Left upper quadrant pain: Secondary | ICD-10-CM | POA: Diagnosis not present

## 2020-05-10 DIAGNOSIS — Z853 Personal history of malignant neoplasm of breast: Secondary | ICD-10-CM

## 2020-05-10 DIAGNOSIS — R8290 Unspecified abnormal findings in urine: Secondary | ICD-10-CM

## 2020-05-10 DIAGNOSIS — R079 Chest pain, unspecified: Secondary | ICD-10-CM | POA: Diagnosis not present

## 2020-05-10 DIAGNOSIS — R911 Solitary pulmonary nodule: Secondary | ICD-10-CM | POA: Diagnosis not present

## 2020-05-10 DIAGNOSIS — R059 Cough, unspecified: Secondary | ICD-10-CM

## 2020-05-10 DIAGNOSIS — T466X5A Adverse effect of antihyperlipidemic and antiarteriosclerotic drugs, initial encounter: Secondary | ICD-10-CM

## 2020-05-10 LAB — POCT URINALYSIS DIPSTICK
Bilirubin, UA: NEGATIVE
Blood, UA: NEGATIVE
Glucose, UA: POSITIVE — AB
Ketones, UA: NEGATIVE
Nitrite, UA: NEGATIVE
Protein, UA: NEGATIVE
Spec Grav, UA: 1.015 (ref 1.010–1.025)
Urobilinogen, UA: 0.2 E.U./dL
pH, UA: 6 (ref 5.0–8.0)

## 2020-05-10 NOTE — Patient Instructions (Signed)
Abdominal Pain, Adult Pain in the abdomen (abdominal pain) can be caused by many things. Often, abdominal pain is not serious and it gets better with no treatment or by being treated at home. However, sometimes abdominal pain is serious. Your health care provider will ask questions about your medical history and do a physical exam to try to determine the cause of your abdominal pain. Follow these instructions at home:  Medicines  Take over-the-counter and prescription medicines only as told by your health care provider.  Do not take a laxative unless told by your health care provider. General instructions  Watch your condition for any changes.  Drink enough fluid to keep your urine pale yellow.  Keep all follow-up visits as told by your health care provider. This is important. Contact a health care provider if:  Your abdominal pain changes or gets worse.  You are not hungry or you lose weight without trying.  You are constipated or have diarrhea for more than 2-3 days.  You have pain when you urinate or have a bowel movement.  Your abdominal pain wakes you up at night.  Your pain gets worse with meals, after eating, or with certain foods.  You are vomiting and cannot keep anything down.  You have a fever.  You have blood in your urine. Get help right away if:  Your pain does not go away as soon as your health care provider told you to expect.  You cannot stop vomiting.  Your pain is only in areas of the abdomen, such as the right side or the left lower portion of the abdomen. Pain on the right side could be caused by appendicitis.  You have bloody or black stools, or stools that look like tar.  You have severe pain, cramping, or bloating in your abdomen.  You have signs of dehydration, such as: ? Dark urine, very little urine, or no urine. ? Cracked lips. ? Dry mouth. ? Sunken eyes. ? Sleepiness. ? Weakness.  You have trouble breathing or chest  pain. Summary  Often, abdominal pain is not serious and it gets better with no treatment or by being treated at home. However, sometimes abdominal pain is serious.  Watch your condition for any changes.  Take over-the-counter and prescription medicines only as told by your health care provider.  Contact a health care provider if your abdominal pain changes or gets worse.  Get help right away if you have severe pain, cramping, or bloating in your abdomen. This information is not intended to replace advice given to you by your health care provider. Make sure you discuss any questions you have with your health care provider. Document Revised: 11/22/2018 Document Reviewed: 11/22/2018 Elsevier Patient Education  [place].  Flank Pain, Adult Flank pain is pain that is located on the side of the body between the upper abdomen and the back. This area is called the flank. The pain may occur over a short period of time (acute), or it may be long-term or recurring (chronic). It may be mild or severe. Flank pain can be caused by many things, including:  Muscle soreness or injury.  Kidney stones or kidney disease.  Stress.  A disease of the spine (vertebral disk disease).  A lung infection (pneumonia).  Fluid around the lungs (pulmonary edema).  A skin rash caused by the chickenpox virus (shingles).  Tumors that affect the back of the abdomen.  Gallbladder disease. Follow these instructions at home:   Drink enough fluid  to keep your urine clear or pale yellow.  Rest as told by your health care provider.  Take over-the-counter and prescription medicines only as told by your health care provider.  Keep a journal to track what has caused your flank pain and what has made it feel better.  Keep all follow-up visits as told by your health care provider. This is important. Contact a health care provider if:  Your pain is not controlled with medicine.  You have new  symptoms.  Your pain gets worse.  You have a fever.  Your symptoms last longer than 2-3 days.  You have trouble urinating or you are urinating very frequently. Get help right away if:  You have trouble breathing or you are short of breath.  Your abdomen hurts or it is swollen or red.  You have nausea or vomiting.  You feel faint or you pass out.  You have blood in your urine. Summary  Flank pain is pain that is located on the side of the body between the upper abdomen and the back.  The pain may occur over a short period of time (acute), or it may be long-term or recurring (chronic). It may be mild or severe.  Flank pain can be caused by many things.  Contact your health care provider if your symptoms get worse or they last longer than 2-3 days. This information is not intended to replace advice given to you by your health care provider. Make sure you discuss any questions you have with your health care provider. Document Revised: 06/26/2017 Document Reviewed: 09/26/2016 Elsevier Patient Education  2020 [employer].  Steps to Quit Smoking Smoking tobacco is the leading cause of preventable death. It can affect almost every organ in the body. Smoking puts you and people around you at risk for many serious, long-lasting (chronic) diseases. Quitting smoking can be hard, but it is one of the best things that you can do for your health. It is never too late to quit. How do I get ready to quit? When you decide to quit smoking, make a plan to help you succeed. Before you quit:  Pick a date to quit. Set a date within the next 2 weeks to give you time to prepare.  Write down the reasons why you are quitting. Keep this list in places where you will see it often.  Tell your family, friends, and co-workers that you are quitting. Their support is important.  Talk with your doctor about the choices that may help you quit.  Find out if your health insurance will pay for these  treatments.  Know the people, places, things, and activities that make you want to smoke (triggers). Avoid them. What first steps can I take to quit smoking?  Throw away all cigarettes at home, at work, and in your car.  Throw away the things that you use when you smoke, such as ashtrays and lighters.  Clean your car. Make sure to empty the ashtray.  Clean your home, including curtains and carpets. What can I do to help me quit smoking? Talk with your doctor about taking medicines and seeing a counselor at the same time. You are more likely to succeed when you do both.  If you are pregnant or breastfeeding, talk with your doctor about counseling or other ways to quit smoking. Do not take medicine to help you quit smoking unless your doctor tells you to do so. To quit smoking: Quit right away  Quit smoking totally,  instead of slowly cutting back on how much you smoke over a period of time.  Go to counseling. You are more likely to quit if you go to counseling sessions regularly. Take medicine You may take medicines to help you quit. Some medicines need a prescription, and some you can buy over-the-counter. Some medicines may contain a drug called nicotine to replace the nicotine in cigarettes. Medicines may:  Help you to stop having the desire to smoke (cravings).  Help to stop the problems that come when you stop smoking (withdrawal symptoms). Your doctor may ask you to use:  Nicotine patches, gum, or lozenges.  Nicotine inhalers or sprays.  Non-nicotine medicine that is taken by mouth. Find resources Find resources and other ways to help you quit smoking and remain smoke-free after you quit. These resources are most helpful when you use them often. They include:  Online chats with a Social worker.  Phone quitlines.  Printed Furniture conservator/restorer.  Support groups or group counseling.  Text messaging programs.  Mobile phone apps. Use apps on your mobile phone or tablet that  can help you stick to your quit plan. There are many free apps for mobile phones and tablets as well as websites. Examples include Quit Guide from the [employer] and smokefree.gov  What things can I do to make it easier to quit?   Talk to your family and friends. Ask them to support and encourage you.  Call a phone quitline (1-800-QUIT-NOW), reach out to support groups, or work with a Social worker.  Ask people who smoke to not smoke around you.  Avoid places that make you want to smoke, such as: ? Bars. ? Parties. ? Smoke-break areas at work.  Spend time with people who do not smoke.  Lower the stress in your life. Stress can make you want to smoke. Try these things to help your stress: ? Getting regular exercise. ? Doing deep-breathing exercises. ? Doing yoga. ? Meditating. ? Doing a body scan. To do this, close your eyes, focus on one area of your body at a time from head to toe. Notice which parts of your body are tense. Try to relax the muscles in those areas. How will I feel when I quit smoking? Day 1 to 3 weeks Within the first 24 hours, you may start to have some problems that come from quitting tobacco. These problems are very bad 2-3 days after you quit, but they do not often last for more than 2-3 weeks. You may get these symptoms:  Mood swings.  Feeling restless, nervous, angry, or annoyed.  Trouble concentrating.  Dizziness.  Strong desire for high-sugar foods and nicotine.  Weight gain.  Trouble pooping (constipation).  Feeling like you may vomit (nausea).  Coughing or a sore throat.  Changes in how the medicines that you take for other issues work in your body.  Depression.  Trouble sleeping (insomnia). Week 3 and afterward After the first 2-3 weeks of quitting, you may start to notice more positive results, such as:  Better sense of smell and taste.  Less coughing and sore throat.  Slower heart rate.  Lower blood pressure.  Clearer skin.  Better  breathing.  Fewer sick days. Quitting smoking can be hard. Do not give up if you fail the first time. Some people need to try a few times before they succeed. Do your best to stick to your quit plan, and talk with your doctor if you have any questions or concerns. Summary  Smoking tobacco  is the leading cause of preventable death. Quitting smoking can be hard, but it is one of the best things that you can do for your health.  When you decide to quit smoking, make a plan to help you succeed.  Quit smoking right away, not slowly over a period of time.  When you start quitting, seek help from your doctor, family, or friends. This information is not intended to replace advice given to you by your health care provider. Make sure you discuss any questions you have with your health care provider. Document Revised: 04/08/2019 Document Reviewed: 10/02/2018 Elsevier Patient Education  [place].  Statin Intolerance Statin intolerance is the inability to take a certain type of cholesterol-lowering medicine (statin) because of unwanted side effects, such as muscle pain. Statins may be prescribed to improve cholesterol levels and lower the risk for heart attack, heart disease, and stroke. People with statin intolerance may experience muscle pain and cramps (myalgia) that go away when the statin is stopped. What are the causes? The cause of this condition is not known. What increases the risk? You may be at higher risk for statin intolerance if you:  Take more than one type of cholesterol-lowering medicine at a time.  Need a higher than normal dosage of a statin.  Have a history of high CK (creatine kinase) in the blood. CK is an enzyme that is released when muscle tissue is damaged.  Are a woman.  Have a body size that is smaller than normal.  Are age 29 or older.  Drink a lot of alcohol.  Are of Asian descent.  Have kidney, liver, or muscle disease.  Have a low level of  hormones that control how your body uses energy (hypothyroidism).  Take certain medicines, including: ? Certain medicines for mental illness (antipsychotics). ? Some types of antibiotics. ? Certain medicines used for blood pressure or heart disease. ? Medicines that reduce the activity of the body's disease-fighting system (immunosuppressants). ? Medicines to treat hepatitis C and HIV/AIDS (human immunodeficiency virus/acquired immunodeficiency syndrome).  Follow an intense exercise program.  Drink a lot of grapefruit juice.  Have a lack of vitamin D (deficiency). What are the signs or symptoms? Signs and symptoms of statin intolerance include:  General muscle aches (myalgia). This may feel similar to muscle aches that are caused by the flu.  Muscle pain, tenderness, cramps, or weakness (myositis).  Severe muscle pain, weakness, and raised blood CK levels (rhabdomyolysis). Symptoms usually go away when the statin is stopped. Rarely, liver damage can also occur, which can cause:  Loss of appetite.  Pain in the upper right abdomen.  Yellowing of the skin or the white parts of the eyes (jaundice). How is this diagnosed? This condition is diagnosed based on your symptoms, your medical history, and a physical exam. You may also have blood tests. How is this treated? Your health care provider may have you stop taking the statin for a short time to see if your symptoms go away. Then your provider may restart your statin, or:  Change you to a different statin.  Lower the dosage of your statin.  Have you take your statin less often.  Change you to another type of cholesterol-lowering medicine.  Stop or change any medicines that might be interfering with your statin.  Limit how much grapefruit juice you drink.  Recommend stopping intense exercise. Follow these instructions at home: Medicines  Take your statin medicine as told by your health care provider. Do not  stop taking  the statin unless your health care provider tells you to stop.  Take other over-the-counter and prescription medicines only as told by your health care provider.  Check with your health care provider before taking any new medicines. Certain medicines can increase your risk for statin intolerance. Lifestyle  Limit alcohol intake to no more than 1 drink a day for nonpregnant women and 2 drinks a day for men. One drink equals 12 oz of beer, 5 oz of wine, or 1 oz of hard liquor.  Do not use any products that contain nicotine or tobacco, such as cigarettes and e-cigarettes. If you need help quitting, ask your health care provider. General instructions   Have blood tests to check CK levels or liver enzymes as told by your health care provider.  Exercise as directed. Ask your health care provider what exercises are best for you. Do not start a new exercise program before talking about it with your health care provider.  Follow instructions from your health care provider about eating or drinking restrictions. Your health care provider may recommend: ? Limiting the amount of grapefruit juice you drink, or not drinking it at all. ? Eating a diet that is low in saturated fats and high in fiber.  Maintain a healthy weight with diet and exercise.  Keep all follow-up visits as told by your health care provider. This is important. Contact a health care provider if:  You have any symptoms of statin intolerance. Summary  Statins are important medicines for improving your cholesterol, which may reduce your risk for heart attack and stroke.  Some people are not able to continue taking a particular statin because of muscle problems (myalgia) or other side effects.  Myalgia is the most common symptom of statin intolerance. Often, the muscle pain and cramps from myalgia go away when the statin is stopped.  Although rare, liver damage can occur as a result of statin intolerance. You should have routine  blood tests to check your liver enzymes.  In most cases, statin intolerance can be managed and you can continue to take a cholesterol-lowering medicine. This information is not intended to replace advice given to you by your health care provider. Make sure you discuss any questions you have with your health care provider. Document Revised: 06/26/2017 Document Reviewed: 12/25/2016 Elsevier Patient Education  [place].

## 2020-05-10 NOTE — Progress Notes (Signed)
Subjective:    Patient ID: Anita Mccormick, female    DOB: May 07, 1946, 74 y.o.   MRN: [id number]  No chief complaint on file.   HPI Pt is a 74 yo female with pmh sig for HTN, chronic diastolic CHF, fatty liver disease, hepatotoxicity 2/2 statin, DM 2, eczema, CKD, thrombocytopenia, tobacco use,  H/o CVA, h/o left breast cancer who was seen today for ongoing concern.  Pt endorses L lower rib cage pain.  Sensation noted as a "grabbing" feeling.  The pain kept pt up last night.  Pt tried left over pain medication for the sensation.  Having a BM daily.  Endorses a dry cough x 3 wks for which she tried allergy medicine.   Pt smoking 1-3 cigarettes/day.  Pt started smoking over 30 years ago when she worked at [employer] and got free cigarettes daily.  Pt also endorses fatigue, occasional cough. States blood sugar has been well controlled at home on glipizide xl 10 mg and Trulicity 1.5 mg weekly.  In the past pt was unable to tolerate a statin for cholesterol due to increased LFTs.  Past Medical History:  Diagnosis Date  . Cancer [facility])    history of no adjunctive RX( no chemo no RT)  . Cerebrovascular accident ([place])   . COLONIC POLYPS, HX OF 12/10/2006   Qualifier: Diagnosis of  By: Leanne Chang MD, Bruce    . Diabetes mellitus    Type 2  . Diabetes mellitus type II, controlled ([place]) 12/10/2006   Poor control on Januvia 139m and glipizide 121mXL Lab Results  Component Value Date   HGBA1C 8.3* 11/02/2014       . Eczema   . Fatty liver disease, nonalcoholic 1259/16/3846 Per Dr. SwLeanne Changab Results  Component Value Date   ALT 31 11/02/2014   AST 57* 11/02/2014   ALKPHOS 104 11/02/2014   BILITOT 1.1 11/02/2014      . Hypertension     Allergies  Allergen Reactions  . Ace Inhibitors Cough    ????  . Chlorthalidone     REACTION: unspecified  . Metformin     REACTION: gi side effects  . Penicillins     REACTION: urticaria (hives)  . Sulfamethoxazole     REACTION: questionable    ROS General: Denies  fever, chills, night sweats, changes in weight, changes in appetite +fatigue HEENT: Denies headaches, ear pain, changes in vision, rhinorrhea, sore throat CV: Denies CP, palpitations, SOB, orthopnea Pulm: Denies SOB, cough, wheezing +intermittent cough GI: Denies abdominal pain, nausea, vomiting, diarrhea, constipation +LUQ pain GU: Denies dysuria, hematuria, frequency, vaginal discharge Msk: Denies muscle cramps, joint pains Neuro: Denies weakness, numbness, tingling Skin: Denies rashes, bruising Psych: Denies depression, anxiety, hallucinations   Objective:    Blood pressure 136/80, pulse 90, temperature 97.9 F (36.6 C), temperature source Oral, weight 152 lb (68.9 kg), SpO2 99 %.  Gen. Pleasant, well-nourished, in no distress, normal affect   HEENT: [place]/AT, face symmetric, conjunctiva clear, no scleral icterus, PERRLA, EOMI, nares patent without drainage Lungs: no accessory muscle use, CTAB, no wheezes or rales Cardiovascular: RRR, no m/r/g, no peripheral edema Abdomen: BS present, soft, TTP of LUQ and underneath bra line and in epigastric area.  Palpable edge of spleen below intercostal margin.  ND, no hepatosplenomegaly. Musculoskeletal: No deformities, no cyanosis or clubbing, normal tone Neuro:  A&Ox3, CN II-XII intact, normal gait Skin:  Warm, no lesions/ rash   Wt Readings from Last 3 Encounters:  12/01/19 150 lb (68  kg)  06/01/18 151 lb 14.4 oz (68.9 kg)  02/20/18 145 lb (65.8 kg)    Lab Results  Component Value Date   WBC 5.7 11/17/2017   HGB 8.5 (L) 11/17/2017   HCT 27.1 (L) 11/17/2017   PLT 130 (L) 11/17/2017   GLUCOSE 351 (H) 11/16/2017   CHOL 146 03/18/2017   TRIG 154.0 (H) 03/18/2017   HDL 49.80 03/18/2017   LDLDIRECT 95.8 07/02/2006   LDLCALC 65 03/18/2017   ALT 25 09/19/2017   AST 68 (H) 09/19/2017   NA 135 11/16/2017   K 4.0 11/16/2017   CL 101 11/16/2017   CREATININE 0.98 11/16/2017   BUN 11 11/16/2017   CO2 23 11/16/2017   TSH 1.32 04/11/2014    INR 1.0 10/18/2007   HGBA1C 6.0 (A) 12/01/2019   MICROALBUR <0.7 06/08/2019    Assessment/Plan:  Left upper quadrant pain  -discussed possible causes -will obtain imaging and labs to further evaluate - Plan: DG Chest 2 View, CBC with Differential/Platelet, CMP with eGFR([facility]), POCT urinalysis dipstick, Urine Culture  Hepatotoxicity due to statin drug  -unable to tolerate statins 2/2 elevated LFTs -Lifestyle modifications encouraged - Plan: CMP with eGFR([facility])  Nicotine use disorder  -Smoking cessation counseling greater than 3 minutes, less than 10 minutes -Currently smoking 3 cigarettes/day -Patient encouraged quit -Continue to cut down -Discussed quit aids -Also discussed low-dose CT for lung cancer screening - Plan: CBC with Differential/Platelet, CBC with Differential/Platelet  History of breast cancer -In remission -Continue yearly mammograms  Type 2 diabetes mellitus with other diabetic kidney complication, without long-term current use of insulin (HCC) -Controlled -Hemoglobin A1c 6.0% on 12/01/2019 -Continue current medications including glipizide XL 10 mg and Trulicity 1.5 mg weekly -Continue lifestyle modification -Patient encouraged to check BS at home regularly. - Plan: Hemoglobin A1c, POCT urinalysis dipstick  Fatigue, unspecified type  - Plan: Brain Natriuretic Peptide, Vitamin D, 25-hydroxy, Vitamin B12, Urine Culture  Cough -Discussed possible causes including seasonal allergies, CHF exacerbation, pneumonia -will obtain CXR given intermittent cough - Plan: Brain Natriuretic Peptide  Abnormal urine finding  F/u prn  Update 05/14/20:  Pt contacted with CXR results, a questionable pulmonary nodule in the left midlung.  CT ordered for further evaluation.  This could also explain patient's eosinophilia and thrombocytopenia on CBC.  BNP elevated, continue lasix.  [name], MD

## 2020-05-10 NOTE — Telephone Encounter (Signed)
Message was sent to Dr Volanda Napoleon and also checked with her to see that she received the request from [facility]

## 2020-05-11 ENCOUNTER — Telehealth: Payer: Self-pay | Admitting: Family Medicine

## 2020-05-11 LAB — CBC WITH DIFFERENTIAL/PLATELET
Absolute Monocytes: 481 cells/uL (ref 200–950)
Basophils Absolute: 81 cells/uL (ref 0–200)
Basophils Relative: 1.5 %
Eosinophils Absolute: 1350 cells/uL — ABNORMAL HIGH (ref 15–500)
Eosinophils Relative: 25 %
HCT: 35.3 % (ref 35.0–45.0)
Hemoglobin: 11.7 g/dL (ref 11.7–15.5)
Lymphs Abs: 1091 cells/uL (ref 850–3900)
MCH: 32.6 pg (ref 27.0–33.0)
MCHC: 33.1 g/dL (ref 32.0–36.0)
MCV: 98.3 fL (ref 80.0–100.0)
MPV: 12.2 fL (ref 7.5–12.5)
Monocytes Relative: 8.9 %
Neutro Abs: 2398 cells/uL (ref 1500–7800)
Neutrophils Relative %: 44.4 %
Platelets: 107 10*3/uL — ABNORMAL LOW (ref 140–400)
RBC: 3.59 10*6/uL — ABNORMAL LOW (ref 3.80–5.10)
RDW: 13.2 % (ref 11.0–15.0)
Total Lymphocyte: 20.2 %
WBC: 5.4 10*3/uL (ref 3.8–10.8)

## 2020-05-11 LAB — URINE CULTURE
MICRO NUMBER:: 11072562
SPECIMEN QUALITY:: ADEQUATE

## 2020-05-11 LAB — EXTRA LAV TOP TUBE

## 2020-05-11 LAB — HEMOGLOBIN A1C
Hgb A1c MFr Bld: 6.5 % of total Hgb — ABNORMAL HIGH (ref <5.7)
Mean Plasma Glucose: 140 (calc)
eAG (mmol/L): 7.7 (calc)

## 2020-05-11 LAB — VITAMIN B12

## 2020-05-11 LAB — VITAMIN D 25 HYDROXY (VIT D DEFICIENCY, FRACTURES)

## 2020-05-11 LAB — BRAIN NATRIURETIC PEPTIDE: Brain Natriuretic Peptide: 152 pg/mL — ABNORMAL HIGH (ref <100)

## 2020-05-11 NOTE — Telephone Encounter (Signed)
Pt is calling in stating that she would like to see if Dr. Volanda Napoleon thinks it is okay for her to go ahead and have her COVID booster done this weekend?

## 2020-05-12 ENCOUNTER — Ambulatory Visit: Payer: Medicare Other | Attending: Internal Medicine

## 2020-05-12 DIAGNOSIS — Z23 Encounter for immunization: Secondary | ICD-10-CM

## 2020-05-12 NOTE — Progress Notes (Signed)
   Covid-19 Vaccination Clinic  Name:  GISEL VIPOND    MRN: [id number] DOB: 05/11/1946  05/12/2020  Ms. Corriher was observed post Covid-19 immunization for 30 minutes based on pre-vaccination screening without incident. She was provided with Vaccine Information Sheet and instruction to access the V-Safe system.   Ms. Clavijo was instructed to call 911 with any severe reactions post vaccine: [name] Difficulty breathing  . Swelling of face and throat  . A fast heartbeat  . A bad rash all over body  . Dizziness and weakness

## 2020-05-13 ENCOUNTER — Other Ambulatory Visit: Payer: Self-pay | Admitting: Family Medicine

## 2020-05-14 ENCOUNTER — Telehealth: Payer: Self-pay | Admitting: Family Medicine

## 2020-05-14 DIAGNOSIS — R918 Other nonspecific abnormal finding of lung field: Secondary | ICD-10-CM

## 2020-05-14 DIAGNOSIS — F17200 Nicotine dependence, unspecified, uncomplicated: Secondary | ICD-10-CM

## 2020-05-14 NOTE — Telephone Encounter (Signed)
Pt was seen at the office on Friday

## 2020-05-14 NOTE — Telephone Encounter (Signed)
Pt notified of CXR results, questionable nodule in L mid lobe. Pt with h/o tobacco use, currently smoking 3 cigs per day.  Order for CT placed.  Pt without questions at this time.  [name], MD

## 2020-05-18 ENCOUNTER — Telehealth: Payer: Self-pay | Admitting: Family Medicine

## 2020-05-18 ENCOUNTER — Other Ambulatory Visit: Payer: Self-pay | Admitting: Family Medicine

## 2020-05-18 ENCOUNTER — Ambulatory Visit
Admission: RE | Admit: 2020-05-18 | Discharge: 2020-05-18 | Disposition: A | Discharge status: Home / Self Care | Payer: Medicare Other | Source: Ambulatory Visit | Attending: Family Medicine | Admitting: Family Medicine

## 2020-05-18 DIAGNOSIS — K7460 Unspecified cirrhosis of liver: Secondary | ICD-10-CM

## 2020-05-18 DIAGNOSIS — F17200 Nicotine dependence, unspecified, uncomplicated: Secondary | ICD-10-CM

## 2020-05-18 DIAGNOSIS — I2510 Atherosclerotic heart disease of native coronary artery without angina pectoris: Secondary | ICD-10-CM | POA: Diagnosis not present

## 2020-05-18 DIAGNOSIS — M47814 Spondylosis without myelopathy or radiculopathy, thoracic region: Secondary | ICD-10-CM | POA: Diagnosis not present

## 2020-05-18 DIAGNOSIS — R918 Other nonspecific abnormal finding of lung field: Secondary | ICD-10-CM

## 2020-05-18 DIAGNOSIS — K449 Diaphragmatic hernia without obstruction or gangrene: Secondary | ICD-10-CM | POA: Diagnosis not present

## 2020-05-18 DIAGNOSIS — I700 Atherosclerosis of aorta: Secondary | ICD-10-CM | POA: Diagnosis not present

## 2020-05-18 NOTE — Telephone Encounter (Signed)
Pt contacted with results from CT chest done today to follow-up on possible nodule seen on chest x-ray from 05/10/2020.  Pt advised no abnormality seen corresponding to the questionable nodule seen on chest x-ray.  CAD noted and cirrhosis of liver.  Pt with previous h/o fatty liver dz and hepatoxicity 2/2 statin.  Pt advised to avoid using Tylenol and drinking alcohol.  Patient was drinking 4 drinks per night of vodka and Pepsi.  Discussed cutting down on alcoholic beverages as opposed to stopping cold [place] to avoid withdrawal symptoms.  Referral placed to gastroenterology.  Patient also mentions recent odor to urine and blood on toilet tissue after urinating this morning.  Patient started increasing p.o. intake of water.  Offered appointment for Saturday clinic, however she declined.  Patient given precautions.  [name], MD

## 2020-06-18 ENCOUNTER — Ambulatory Visit (INDEPENDENT_AMBULATORY_CARE_PROVIDER_SITE_OTHER): Payer: Medicare Other | Admitting: Family Medicine

## 2020-06-18 ENCOUNTER — Encounter: Payer: Self-pay | Admitting: Family Medicine

## 2020-06-18 ENCOUNTER — Other Ambulatory Visit: Payer: Self-pay

## 2020-06-18 VITALS — BP 138/80 | HR 94 | Temp 98.8°F | Wt 150.8 lb

## 2020-06-18 DIAGNOSIS — Z1231 Encounter for screening mammogram for malignant neoplasm of breast: Secondary | ICD-10-CM | POA: Diagnosis not present

## 2020-06-18 DIAGNOSIS — Z853 Personal history of malignant neoplasm of breast: Secondary | ICD-10-CM | POA: Diagnosis not present

## 2020-06-18 DIAGNOSIS — E1129 Type 2 diabetes mellitus with other diabetic kidney complication: Secondary | ICD-10-CM | POA: Diagnosis not present

## 2020-06-18 DIAGNOSIS — R0789 Other chest pain: Secondary | ICD-10-CM | POA: Diagnosis not present

## 2020-06-18 LAB — HM MAMMOGRAPHY

## 2020-06-18 NOTE — Progress Notes (Signed)
Subjective:    Patient ID: Anita Mccormick, female    DOB: Aug 27, 1945, 74 y.o.   MRN: [id number]  No chief complaint on file.   HPI Patient is a 74 yo female with pmh sig for DM II with renal manifestations, chronic LE edema, h/o L breast cancer s/p mastectomy with implant, s/p L breast implant removal, HTN, chronic diastolic CHF, h/o CVA, eczema, atherosclerosis, tobacco use, cirrhosis of liver, hepatotoxicity due to statin who was seen for ongoing concern. Pt endorses a "burning, grabbing pain", and swelling at mastectomy site.  Pain x months.  Pt typically has mammograms done at [facility].  Pt denies dysuria, n/v, suprapubic pain, fever.  Pt states she stopped drinking EtOH since finding out about cirrhosis.  Pt concerned she may have a UTI or yeast infection.  Denies d/c.  Endorses irritation.  Past Medical History:  Diagnosis Date  . Cancer [facility])    history of no adjunctive RX( no chemo no RT)  . Cerebrovascular accident ([place])   . COLONIC POLYPS, HX OF 12/10/2006   Qualifier: Diagnosis of  By: Leanne Chang MD, Bruce    . Diabetes mellitus    Type 2  . Diabetes mellitus type II, controlled ([place]) 12/10/2006   Poor control on Januvia 100mg  and glipizide 10mg  XL Lab Results  Component Value Date   HGBA1C 8.3* 11/02/2014       . Eczema   . Fatty liver disease, nonalcoholic 97/35/3299   Per Dr. Leanne Chang Lab Results  Component Value Date   ALT 31 11/02/2014   AST 57* 11/02/2014   ALKPHOS 104 11/02/2014   BILITOT 1.1 11/02/2014      . Hypertension     Allergies  Allergen Reactions  . Ace Inhibitors Cough    ????  . Chlorthalidone     REACTION: unspecified  . Metformin     REACTION: gi side effects  . Penicillins     REACTION: urticaria (hives)  . Sulfamethoxazole     REACTION: questionable    ROS General: Denies fever, chills, night sweats, changes in weight, changes in appetite HEENT: Denies headaches, ear pain, changes in vision, rhinorrhea, sore throat CV: Denies CP, palpitations, SOB,  orthopnea Pulm: Denies SOB, cough, wheezing GI: Denies abdominal pain, nausea, vomiting, diarrhea, constipation GU: Denies dysuria, hematuria, frequency, vaginal discharge  +pain and edema in L chest/mastectomy site, vaginal irritation Msk: Denies muscle cramps, joint pains Neuro: Denies weakness, numbness, tingling Skin: Denies rashes, bruising Psych: Denies depression, anxiety, hallucinations     Objective:    Blood pressure 138/80, pulse 94, temperature 98.8 F (37.1 C), temperature source Oral, weight 150 lb 12.8 oz (68.4 kg), SpO2 98 %.  Gen. Pleasant, well-nourished, in no distress, normal affect  HEENT: [place]/AT, face symmetric, conjunctiva clear, no scleral icterus, PERRLA, EOMI, nares patent without drainage Lungs: no accessory muscle use Cardiovascular: RRR, no peripheral edema Chest: TTP of L lateral chest inferior to surgical incision s/p mastectomy.  No masses appreciated. Musculoskeletal: No deformities, no cyanosis or clubbing, normal tone Neuro:  A&Ox3, CN II-XII intact, normal gait Skin:  Warm, no lesions/ rash   Wt Readings from Last 3 Encounters:  06/18/20 150 lb 12.8 oz (68.4 kg)  05/10/20 152 lb (68.9 kg)  12/01/19 150 lb (68 kg)    Lab Results  Component Value Date   WBC 5.4 05/10/2020   HGB 11.7 05/10/2020   HCT 35.3 05/10/2020   PLT 107 (L) 05/10/2020   GLUCOSE 351 (H) 11/16/2017   CHOL 146 03/18/2017  TRIG 154.0 (H) 03/18/2017   HDL 49.80 03/18/2017   LDLDIRECT 95.8 07/02/2006   LDLCALC 65 03/18/2017   ALT 25 09/19/2017   AST 68 (H) 09/19/2017   NA 135 11/16/2017   K 4.0 11/16/2017   CL 101 11/16/2017   CREATININE 0.98 11/16/2017   BUN 11 11/16/2017   CO2 23 11/16/2017   TSH 1.32 04/11/2014   INR 1.0 10/18/2007   HGBA1C 6.5 (H) 05/10/2020   MICROALBUR <0.7 06/08/2019    Assessment/Plan:  Left-sided chest wall pain  -CT chest 05/18/20 with no acute intrathoracic process, moderate severity coronary artery disease, small hiatal hernia,  cirrhotic liver, no abnormality seen which correspond to the nodular opacity overlying the mid left lung on prior chest film dated 05/10/2020. -burning, grabbing pain possibly 2/2 hiatal hernia -will proceed with mammogram given soft tissue edema - Plan: MS DIGITAL DIAG TOMO UNI LEFT  History of breast cancer  - Plan: MS DIGITAL DIAG TOMO UNI LEFT  Type 2 diabetes mellitus with other diabetic kidney complication, without long-term current use of insulin (HCC) -UA with 2+ glucose, SG 1.015 -Modification strongly encouraged -Continue Trulicity 1.5 mg weekly, glipizide XL 10 mg BID  F/u prn  [name], MD

## 2020-06-20 ENCOUNTER — Encounter: Payer: Self-pay | Admitting: Family Medicine

## 2020-06-20 DIAGNOSIS — N6489 Other specified disorders of breast: Secondary | ICD-10-CM | POA: Diagnosis not present

## 2020-06-20 LAB — HM MAMMOGRAPHY

## 2020-06-25 ENCOUNTER — Encounter: Payer: Self-pay | Admitting: Family Medicine

## 2020-07-03 ENCOUNTER — Encounter: Payer: Self-pay | Admitting: Family Medicine

## 2020-07-05 ENCOUNTER — Other Ambulatory Visit: Payer: Self-pay

## 2020-07-05 ENCOUNTER — Other Ambulatory Visit: Payer: Self-pay | Admitting: Family Medicine

## 2020-07-05 ENCOUNTER — Ambulatory Visit (INDEPENDENT_AMBULATORY_CARE_PROVIDER_SITE_OTHER): Payer: Medicare Other

## 2020-07-05 DIAGNOSIS — Z23 Encounter for immunization: Secondary | ICD-10-CM | POA: Diagnosis not present

## 2020-07-05 DIAGNOSIS — R0789 Other chest pain: Secondary | ICD-10-CM

## 2020-07-05 MED ORDER — TRAMADOL-ACETAMINOPHEN 37.5-325 MG PO TABS: 1.0000 | ORAL_TABLET | Freq: Three times a day (TID) | ORAL | 0 refills | Status: AC | PRN

## 2020-07-05 NOTE — Progress Notes (Signed)
RX for ultracet 37.5-325 mg, 5 day supply sent to pharmacy for L chest wall pain.  PDMP reviewed.  F/u in the next few wks for continued or worsened symptoms.

## 2020-07-24 ENCOUNTER — Ambulatory Visit: Payer: Medicare Other | Admitting: Podiatry

## 2020-07-24 ENCOUNTER — Other Ambulatory Visit: Payer: Self-pay

## 2020-07-24 DIAGNOSIS — M79675 Pain in left toe(s): Secondary | ICD-10-CM

## 2020-07-24 DIAGNOSIS — B351 Tinea unguium: Secondary | ICD-10-CM

## 2020-07-24 DIAGNOSIS — M79674 Pain in right toe(s): Secondary | ICD-10-CM

## 2020-07-24 DIAGNOSIS — M79662 Pain in left lower leg: Secondary | ICD-10-CM

## 2020-07-24 DIAGNOSIS — Q828 Other specified congenital malformations of skin: Secondary | ICD-10-CM | POA: Diagnosis not present

## 2020-07-24 DIAGNOSIS — E119 Type 2 diabetes mellitus without complications: Secondary | ICD-10-CM

## 2020-07-24 DIAGNOSIS — M79661 Pain in right lower leg: Secondary | ICD-10-CM

## 2020-07-24 MED ORDER — GABAPENTIN 100 MG PO CAPS
100.0000 mg | ORAL_CAPSULE | Freq: Every day | ORAL | 0 refills | Status: DC
Start: 2020-07-24 — End: 2021-02-27

## 2020-07-24 NOTE — Patient Instructions (Signed)

## 2020-07-27 NOTE — Progress Notes (Signed)
Subjective: 74 y.o. returns the office today for painful, elongated, thickened toenails which she cannot trim herself.  This is a callus on the toes across discomfort.  Denies any open lesions.  Denies any acute changes since last appointment and no new complaints today. Denies any systemic complaints such as fevers, chills, nausea, vomiting.   PCP: Deeann Saint, MD  Objective: AAO 3, NAD DP/PT pulses palpable, CRT less than 3 seconds Nails hypertrophic, dystrophic, elongated, brittle, discolored 10. There is tenderness overlying the nails 1-5 bilaterally. There is no surrounding erythema or drainage along the nail sites. Hyperkeratotic lesion on the dorsal lateral aspect of the left and right fifth toe just adjacent to the toenail.  Hammertoes evident. No pain with calf compression, swelling, warmth, erythema.  Assessment: Patient presents with symptomatic onychomycosis; hyperkeratotic lesions  Plan: -Treatment options including alternatives, risks, complications were discussed -Nails sharply debrided 10 without complication/bleeding. -Hyperkeratotic lesion sharply debrided x2 without complications or bleeding.  Recommended to continue with moisturizer daily.  -Discussed daily foot inspection. If there are any changes, to call the office immediately.  -Follow-up as scheduled or sooner if any problems are to arise. In the meantime, encouraged to call the office with any questions, concerns, changes symptoms.  Ovid Curd, DPM

## 2020-08-21 ENCOUNTER — Telehealth: Payer: Self-pay | Admitting: Family Medicine

## 2020-08-21 NOTE — Telephone Encounter (Signed)
Pt is calling in to see if she can get some cough medication she has been coughing for several day and is coughing up some mucous (clear and thick).  Pt stated the she need something so that she doesn't get sore.  Pt declined an appointment.  [facility] [address] and new insurance [employer]

## 2020-08-22 ENCOUNTER — Telehealth: Payer: Medicare HMO | Admitting: Family Medicine

## 2020-08-22 NOTE — Telephone Encounter (Signed)
Pt is scheduled for a Telephone visit today at 4 pm for cough

## 2020-08-23 ENCOUNTER — Other Ambulatory Visit: Payer: Self-pay

## 2020-08-23 ENCOUNTER — Ambulatory Visit (INDEPENDENT_AMBULATORY_CARE_PROVIDER_SITE_OTHER): Payer: Medicare HMO

## 2020-08-23 ENCOUNTER — Ambulatory Visit (INDEPENDENT_AMBULATORY_CARE_PROVIDER_SITE_OTHER): Payer: Medicare HMO | Admitting: Family Medicine

## 2020-08-23 ENCOUNTER — Encounter: Payer: Self-pay | Admitting: Family Medicine

## 2020-08-23 VITALS — BP 132/80 | HR 64 | Temp 98.5°F | Wt 149.8 lb

## 2020-08-23 DIAGNOSIS — R059 Cough, unspecified: Secondary | ICD-10-CM

## 2020-08-23 DIAGNOSIS — R142 Eructation: Secondary | ICD-10-CM

## 2020-08-23 DIAGNOSIS — K449 Diaphragmatic hernia without obstruction or gangrene: Secondary | ICD-10-CM | POA: Diagnosis not present

## 2020-08-23 DIAGNOSIS — R0781 Pleurodynia: Secondary | ICD-10-CM

## 2020-08-23 DIAGNOSIS — K7460 Unspecified cirrhosis of liver: Secondary | ICD-10-CM | POA: Diagnosis not present

## 2020-08-23 DIAGNOSIS — K219 Gastro-esophageal reflux disease without esophagitis: Secondary | ICD-10-CM

## 2020-08-23 MED ORDER — BENZONATATE 100 MG PO CAPS: 100.0000 mg | ORAL_CAPSULE | Freq: Two times a day (BID) | ORAL | 0 refills | Status: DC | PRN

## 2020-08-23 MED ORDER — OMEPRAZOLE 20 MG PO CPDR: 20.0000 mg | DELAYED_RELEASE_CAPSULE | Freq: Every day | ORAL | 0 refills | Status: DC

## 2020-08-23 NOTE — Patient Instructions (Addendum)
A prescription for medicine for heart burn and one for cough was sent to your pharmacy. Cough, Adult A cough helps to clear your throat and lungs. A cough may be a sign of an illness or another medical condition. An acute cough may only last 2-3 weeks, while a chronic cough may last 8 or more weeks. Many things can cause a cough. They include:  Germs (viruses or bacteria) that attack the airway.  Breathing in things that bother (irritate) your lungs.  Allergies.  Asthma.  Mucus that runs down the back of your throat (postnasal drip).  Smoking.  Acid backing up from the stomach into the tube that moves food from the mouth to the stomach (gastroesophageal reflux).  Some medicines.  Lung problems.  Other medical conditions, such as heart failure or a blood clot in the lung (pulmonary embolism). Follow these instructions at home: Medicines  Take over-the-counter and prescription medicines only as told by your doctor.  Talk with your doctor before you take medicines that stop a cough (cough suppressants). Lifestyle  Do not smoke, and try not to be around smoke. Do not use any products that contain nicotine or tobacco, such as cigarettes, e-cigarettes, and chewing tobacco. If you need help quitting, ask your doctor.  Drink enough fluid to keep your pee (urine) pale yellow.  Avoid caffeine.  Do not drink alcohol if your doctor tells you not to drink.   General instructions  Watch for any changes in your cough. Tell your doctor about them.  Always cover your mouth when you cough.  Stay away from things that make you cough, such as perfume, candles, campfire smoke, or cleaning products.  If the air is dry, use a cool mist vaporizer or humidifier in your home.  If your cough is worse at night, try using extra pillows to raise your head up higher while you sleep.  Rest as needed.  Keep all follow-up visits as told by your doctor. This is important.   Contact a doctor  if:  You have new symptoms.  You cough up pus.  Your cough does not get better after 2-3 weeks, or your cough gets worse.  Cough medicine does not help your cough and you are not sleeping well.  You have pain that gets worse or pain that is not helped with medicine.  You have a fever.  You are losing weight and you do not know why.  You have night sweats. Get help right away if:  You cough up blood.  You have trouble breathing.  Your heartbeat is very fast. These symptoms may be an emergency. Do not wait to see if the symptoms will go away. Get medical help right away. Call your local emergency services (911 in the [place] Do not drive yourself to the hospital. Summary  A cough helps to clear your throat and lungs. Many things can cause a cough.  Take over-the-counter and prescription medicines only as told by your doctor.  Always cover your mouth when you cough.  Contact a doctor if you have new symptoms or you have a cough that does not get better or gets worse. This information is not intended to replace advice given to you by your health care provider. Make sure you discuss any questions you have with your health care provider. Document Revised: 09/02/2019 Document Reviewed: 08/02/2018 Elsevier Patient Education  2021 Elsevier Inc.  Chest Wall Pain Chest wall pain is pain in or around the bones and muscles of your  chest. Chest wall pain may be caused by:  An injury.  Coughing a lot.  Using your chest and arm muscles too much. Sometimes, the cause may not be known. This pain may take a few weeks or longer to get better. Follow these instructions at home: Managing pain, stiffness, and swelling If told, put ice on the painful area:  Put ice in a plastic bag.  Place a towel between your skin and the bag.  Leave the ice on for 20 minutes, 2-3 times a day.   Activity  Rest as told by your doctor.  Avoid doing things that cause pain. This includes lifting  heavy items.  Ask your doctor what activities are safe for you. General instructions  Take over-the-counter and prescription medicines only as told by your doctor.  Do not use any products that contain nicotine or tobacco, such as cigarettes, e-cigarettes, and chewing tobacco. If you need help quitting, ask your doctor.  Keep all follow-up visits as told by your doctor. This is important.   Contact a doctor if:  You have a fever.  Your chest pain gets worse.  You have new symptoms. Get help right away if:  You feel sick to your stomach (nauseous) or you throw up (vomit).  You feel sweaty or light-headed.  You have a cough with mucus from your lungs (sputum) or you cough up blood.  You are short of breath. These symptoms may be an emergency. Do not wait to see if the symptoms will go away. Get medical help right away. Call your local emergency services (911 in the [place] Do not drive yourself to the hospital. Summary  Chest wall pain is pain in or around the bones and muscles of your chest.  It may be treated with ice, rest, and medicines. Your condition may also get better if you avoid doing things that cause pain.  Contact a doctor if you have a fever, chest pain that gets worse, or new symptoms.  Get help right away if you feel light-headed or you get short of breath. These symptoms may be an emergency. This information is not intended to replace advice given to you by your health care provider. Make sure you discuss any questions you have with your health care provider. Document Revised: 01/14/2018 Document Reviewed: 01/14/2018 Elsevier Patient Education  2021 [place] for Gastroesophageal Reflux Disease, Adult When you have gastroesophageal reflux disease (GERD), the foods you eat and your eating habits are very important. Choosing the right foods can help ease your discomfort. Think about working with a food expert (dietitian) to help you make good  choices. What are tips for following this plan? Reading food labels  Look for foods that are low in saturated fat. Foods that may help with your symptoms include: ? Foods that have less than 5% of daily value (DV) of fat. ? Foods that have 0 grams of trans fat. Cooking  Do not fry your food.  Cook your food by baking, steaming, grilling, or broiling. These are all methods that do not need a lot of fat for cooking.  To add flavor, try to use herbs that are low in spice and acidity. Meal planning  Choose healthy foods that are low in fat, such as: ? Fruits and vegetables. ? Whole grains. ? Low-fat dairy products. ? Lean meats, fish, and poultry.  Eat small meals often instead of eating 3 large meals each day. Eat your meals slowly in a place where  you are relaxed. Avoid bending over or lying down until 2-3 hours after eating.  Limit high-fat foods such as fatty meats or fried foods.  Limit your intake of fatty foods, such as oils, butter, and shortening.  Avoid the following as told by your doctor: ? Foods that cause symptoms. These may be different for different people. Keep a food diary to keep track of foods that cause symptoms. ? Alcohol. ? Drinking a lot of liquid with meals. ? Eating meals during the 2-3 hours before bed.   Lifestyle  Stay at a healthy weight. Ask your doctor what weight is healthy for you. If you need to lose weight, work with your doctor to do so safely.  Exercise for at least 30 minutes on 5 or more days each week, or as told by your doctor.  Wear loose-fitting clothes.  Do not smoke or use any products that contain nicotine or tobacco. If you need help quitting, ask your doctor.  Sleep with the head of your bed higher than your feet. Use a wedge under the mattress or blocks under the bed frame to raise the head of the bed.  Chew sugar-free gum after meals. What foods should eat? Eat a healthy, well-balanced diet of fruits, vegetables, whole  grains, low-fat dairy products, lean meats, fish, and poultry. Each person is different. Foods that may cause symptoms in one person may not cause any symptoms in another person. Work with your doctor to find foods that are safe for you. The items listed above may not be a complete list of what you can eat and drink. Contact a food expert for more options.   What foods should I avoid? Limiting some of these foods may help in managing the symptoms of GERD. Everyone is different. Talk with a food expert or your doctor to help you find the exact foods to avoid, if any. Fruits Any fruits prepared with added fat. Any fruits that cause symptoms. For some people, this may include citrus fruits, such as oranges, grapefruit, pineapple, and lemons. Vegetables Deep-fried vegetables. [place] fries. Any vegetables prepared with added fat. Any vegetables that cause symptoms. For some people, this may include tomatoes and tomato products, chili peppers, onions and garlic, and horseradish. Grains Pastries or quick breads with added fat. Meats and other proteins High-fat meats, such as fatty beef or pork, hot dogs, ribs, ham, sausage, salami, and bacon. Fried meat or protein, including fried fish and fried chicken. Nuts and nut butters, in large amounts. Dairy Whole milk and chocolate milk. Sour cream. Cream. Ice cream. Cream cheese. Milkshakes. Fats and oils Butter. Margarine. Shortening. Ghee. Beverages Coffee and tea, with or without caffeine. Carbonated beverages. Sodas. Energy drinks. Fruit juice made with acidic fruits, such as orange or grapefruit. Tomato juice. Alcoholic drinks. Sweets and desserts Chocolate and cocoa. Donuts. Seasonings and condiments Pepper. Peppermint and spearmint. Added salt. Any condiments, herbs, or seasonings that cause symptoms. For some people, this may include curry, hot sauce, or vinegar-based salad dressings. The items listed above may not be a complete list of what you  should not eat and drink. Contact a food expert for more options. Questions to ask your doctor Diet and lifestyle changes are often the first steps that are taken to manage symptoms of GERD. If diet and lifestyle changes do not help, talk with your doctor about taking medicines. Where to find more information  International Foundation for Gastrointestinal Disorders: aboutgerd.org Summary  When you have GERD, food and lifestyle  choices are very important in easing your symptoms.  Eat small meals often instead of 3 large meals a day. Eat your meals slowly and in a place where you are relaxed.  Avoid bending over or lying down until 2-3 hours after eating.  Limit high-fat foods such as fatty meats or fried foods. This information is not intended to replace advice given to you by your health care provider. Make sure you discuss any questions you have with your health care provider. Document Revised: 01/23/2020 Document Reviewed: 01/23/2020 Elsevier Patient Education  [place].

## 2020-08-23 NOTE — Progress Notes (Signed)
Subjective:    Patient ID: Anita Mccormick, female    DOB: 09-19-1945, 75 y.o.   MRN: [id number]  No chief complaint on file.   HPI Patient is a 75 yo female with pmh sig for h/o L breast cancer, DM II, HTN, h/o CVA, Aortic atherosclerosis, was seen today for acute concern.  Pt endorses cough, post nasal drainage, headache, and decreased appetite, heart burn, and belching for a few days.  Pt tried Mucinex for her symptoms but it caused nausea.  Pt denies ear pain or pressure, fever, n/v.  Still having L rib pain.    Past Medical History:  Diagnosis Date  . Cancer [facility])    history of no adjunctive RX( no chemo no RT)  . Cerebrovascular accident ([place])   . COLONIC POLYPS, HX OF 12/10/2006   Qualifier: Diagnosis of  By: Leanne Chang MD, Bruce    . Diabetes mellitus    Type 2  . Diabetes mellitus type II, controlled ([place]) 12/10/2006   Poor control on Januvia 100mg  and glipizide 10mg  XL Lab Results  Component Value Date   HGBA1C 8.3* 11/02/2014       . Eczema   . Fatty liver disease, nonalcoholic 40/34/7425   Per Dr. Leanne Chang Lab Results  Component Value Date   ALT 31 11/02/2014   AST 57* 11/02/2014   ALKPHOS 104 11/02/2014   BILITOT 1.1 11/02/2014      . Hypertension     Allergies  Allergen Reactions  . Ace Inhibitors Cough    ????  . Chlorthalidone     REACTION: unspecified  . Metformin     REACTION: gi side effects  . Penicillins     REACTION: urticaria (hives)  . Sulfamethoxazole     REACTION: questionable    ROS General: Denies fever, chills, night sweats, changes in weight  +changes in appetite HEENT: Denies ear pain, changes in vision, sore throat  + rhinorrhea, HA, post nasal drainage CV: Denies CP, palpitations, SOB, orthopnea Pulm: Denies SOB, wheezing  +cough GI: Denies abdominal pain, nausea, vomiting, diarrhea, constipation  +belching, acid reflux GU: Denies dysuria, hematuria, frequency, vaginal discharge Msk: Denies muscle cramps, joint pains +L rib pain Neuro: Denies  weakness, numbness, tingling Skin: Denies rashes, bruising Psych: Denies depression, anxiety, hallucinations     Objective:    Blood pressure 132/80, pulse 64, temperature 98.5 F (36.9 C), temperature source Oral, weight 149 lb 12.8 oz (67.9 kg), SpO2 96 %.  Gen. Pleasant, well-nourished, in no distress, normal affect   HEENT: [place]/AT, face symmetric, conjunctiva clear, no scleral icterus, PERRLA, EOMI, nares patent without drainage, post nasal drainage Lungs: intermittent cough,no accessory muscle use, CTAB, no wheezes or rales Cardiovascular: RRR, no m/r/g, no peripheral edema. Musculoskeletal: No deformities, no cyanosis or clubbing, normal tone Neuro:  A&Ox3, CN II-XII intact, normal gait Skin:  Warm, no lesions/ rash   Wt Readings from Last 3 Encounters:  06/18/20 150 lb 12.8 oz (68.4 kg)  05/10/20 152 lb (68.9 kg)  12/01/19 150 lb (68 kg)    Lab Results  Component Value Date   WBC 5.4 05/10/2020   HGB 11.7 05/10/2020   HCT 35.3 05/10/2020   PLT 107 (L) 05/10/2020   GLUCOSE 351 (H) 11/16/2017   CHOL 146 03/18/2017   TRIG 154.0 (H) 03/18/2017   HDL 49.80 03/18/2017   LDLDIRECT 95.8 07/02/2006   LDLCALC 65 03/18/2017   ALT 25 09/19/2017   AST 68 (H) 09/19/2017   NA 135 11/16/2017   K  4.0 11/16/2017   CL 101 11/16/2017   CREATININE 0.98 11/16/2017   BUN 11 11/16/2017   CO2 23 11/16/2017   TSH 1.32 04/11/2014   INR 1.0 10/18/2007   HGBA1C 6.5 (H) 05/10/2020   MICROALBUR <0.7 06/08/2019    Assessment/Plan:  Cough  -supportive care - Plan: DG Chest 2 View, benzonatate (TESSALON) 100 MG capsule  Rib pain on left side  -CT chest 05/18/20 with no acute intrathoracic process. Marked severity CAD.  Small hiatal hernia. Cirrhotic liver. No abnormalities are seen which correspond to the nodular opacity overlying the mid left lung on prior chest plain film dated 05/10/20. -mammogram up to date. -consider CT abd/pelvis if no relief with PPI - Plan: DG Chest 2  View  Belching  - Plan: omeprazole (PRILOSEC) 20 MG capsule  Gastroesophageal reflux disease, unspecified whether esophagitis present  -avoid foods known to cause problems -start PPI daily -f/u with GI - Plan: omeprazole (PRILOSEC) 20 MG capsule  F/u prn for continued or worsened symptoms  [name], MD

## 2020-08-28 DIAGNOSIS — K5900 Constipation, unspecified: Secondary | ICD-10-CM | POA: Diagnosis not present

## 2020-08-28 DIAGNOSIS — R140 Abdominal distension (gaseous): Secondary | ICD-10-CM | POA: Diagnosis not present

## 2020-08-28 DIAGNOSIS — K449 Diaphragmatic hernia without obstruction or gangrene: Secondary | ICD-10-CM | POA: Diagnosis not present

## 2020-08-28 DIAGNOSIS — R1012 Left upper quadrant pain: Secondary | ICD-10-CM | POA: Diagnosis not present

## 2020-08-29 ENCOUNTER — Other Ambulatory Visit: Payer: Self-pay | Admitting: Family Medicine

## 2020-08-29 DIAGNOSIS — E1129 Type 2 diabetes mellitus with other diabetic kidney complication: Secondary | ICD-10-CM

## 2020-08-30 ENCOUNTER — Telehealth: Payer: Self-pay | Admitting: Family Medicine

## 2020-08-30 NOTE — Telephone Encounter (Signed)
Spoke with pt state that her insurance will not cover the test strip, pt state that she called medical supply and they will charge her $3 and she is waiting for them to deliver. Advised pt to call the office if needs further assistance.

## 2020-08-30 NOTE — Telephone Encounter (Signed)
Pt is calling to see if she can get refill on Rx One Touch Ultra Blue test strips stated that she is out.  [facility] [address]

## 2020-09-01 DIAGNOSIS — E119 Type 2 diabetes mellitus without complications: Secondary | ICD-10-CM | POA: Diagnosis not present

## 2020-09-01 DIAGNOSIS — I10 Essential (primary) hypertension: Secondary | ICD-10-CM | POA: Diagnosis not present

## 2020-09-11 ENCOUNTER — Encounter: Payer: Self-pay | Admitting: Family Medicine

## 2020-09-16 ENCOUNTER — Other Ambulatory Visit: Payer: Self-pay | Admitting: Family Medicine

## 2020-09-25 DIAGNOSIS — K5901 Slow transit constipation: Secondary | ICD-10-CM | POA: Diagnosis not present

## 2020-09-25 DIAGNOSIS — K219 Gastro-esophageal reflux disease without esophagitis: Secondary | ICD-10-CM | POA: Diagnosis not present

## 2020-10-03 ENCOUNTER — Other Ambulatory Visit: Payer: Self-pay | Admitting: Family Medicine

## 2020-10-03 DIAGNOSIS — I10 Essential (primary) hypertension: Secondary | ICD-10-CM

## 2020-10-03 DIAGNOSIS — I5032 Chronic diastolic (congestive) heart failure: Secondary | ICD-10-CM

## 2020-10-03 DIAGNOSIS — E1129 Type 2 diabetes mellitus with other diabetic kidney complication: Secondary | ICD-10-CM

## 2020-10-04 ENCOUNTER — Telehealth: Payer: Self-pay | Admitting: Family Medicine

## 2020-10-04 NOTE — Progress Notes (Signed)
  Chronic Care Management   Outreach Note  10/04/2020 Name: Anita Mccormick MRN: [id number] DOB: Feb 05, 1946  Referred by: Billie Ruddy, MD Reason for referral : No chief complaint on file.   An unsuccessful telephone outreach was attempted today. The patient was referred to the pharmacist for assistance with care management and care coordination.   Follow Up Plan:   Carley Perdue [employer] Scheduler

## 2020-10-15 NOTE — Progress Notes (Signed)
[facility])                                            [place] Team                                        Statin Quality Measure Assessment    10/15/2020  DEMAYA HARDGE 1946/04/06 [id number]   I am a [facility] clinical pharmacist that reviews patients for statin quality initiatives.     Per review of chart and payor information, patient has a diagnosis of diabetes but is not currently filling a statin prescription.  This places patient into the SUPD (Statin Use In Patients with Diabetes) measure for CMS. On 05/18/20, dx code K 74.60 (cirrhosis of liver without ascites) was added to the patient's encounter. GI Specialist at [facility] was faxed prior to appointment on 09/25/2020 regarding dx code renewal for this year. There are no current appointments with PCP at this time.      Component Value Date/Time   CHOL 146 03/18/2017 0851   TRIG 154.0 (H) 03/18/2017 0851   TRIG 202 (HH) 07/02/2006 1110   HDL 49.80 03/18/2017 0851   CHOLHDL 3 03/18/2017 0851   VLDL 30.8 03/18/2017 0851   LDLCALC 65 03/18/2017 0851   LDLDIRECT 95.8 07/02/2006 1110    Please consider renewing dx code K 74.60 for SUPD CMS measure, by associating code and adding to problem list during office visit this year.    Please let us know your decision.    Thank you for allowing [facility] Team to be involved in this patient's care.     Kristeen Miss, PharmD Clinical Pharmacist [place] Cell: [phone]

## 2020-10-16 ENCOUNTER — Telehealth: Payer: Self-pay | Admitting: Family Medicine

## 2020-10-16 NOTE — Progress Notes (Signed)
  Chronic Care Management   Note  10/16/2020 Name: Anita Mccormick MRN: [id number] DOB: 22-Sep-1945  Anita Mccormick is a 75 y.o. year old female who is a primary care patient of Billie Ruddy, MD. I reached out to Anita Mccormick by phone today in response to a referral sent by Anita Mccormick's PCP, Billie Ruddy, MD.   Anita Mccormick was given information about Chronic Care Management services today including:  1. CCM service includes personalized support from designated clinical staff supervised by her physician, including individualized plan of care and coordination with other care providers 2. 24/7 contact phone numbers for assistance for urgent and routine care needs. 3. Service will only be billed when office clinical staff spend 20 minutes or more in a month to coordinate care. 4. Only one practitioner may furnish and bill the service in a calendar month. 5. The patient may stop CCM services at any time (effective at the end of the month) by phone call to the office staff.   Patient agreed to services and verbal consent obtained.   Follow up plan:   Anita Mccormick [employer] Scheduler

## 2020-10-22 ENCOUNTER — Ambulatory Visit (INDEPENDENT_AMBULATORY_CARE_PROVIDER_SITE_OTHER): Payer: Medicare HMO | Admitting: Podiatry

## 2020-10-22 ENCOUNTER — Other Ambulatory Visit: Payer: Self-pay

## 2020-10-22 DIAGNOSIS — B351 Tinea unguium: Secondary | ICD-10-CM | POA: Diagnosis not present

## 2020-10-22 DIAGNOSIS — E119 Type 2 diabetes mellitus without complications: Secondary | ICD-10-CM

## 2020-10-22 DIAGNOSIS — Q828 Other specified congenital malformations of skin: Secondary | ICD-10-CM | POA: Diagnosis not present

## 2020-10-22 DIAGNOSIS — M79675 Pain in left toe(s): Secondary | ICD-10-CM | POA: Diagnosis not present

## 2020-10-22 DIAGNOSIS — M79674 Pain in right toe(s): Secondary | ICD-10-CM

## 2020-10-22 NOTE — Progress Notes (Signed)
Subjective: 75 y.o. returns the office today for painful, elongated, thickened toenails which she cannot trim herself.   Denies any open lesions.  Denies any acute changes since last appointment and no new complaints today. Denies any systemic complaints such as fevers, chills, nausea, vomiting.   PCP: Anita Ruddy, MD  Objective: AAO 3, NAD DP/PT pulses palpable, CRT less than 3 seconds Nails hypertrophic, dystrophic, elongated, brittle, discolored 10. There is tenderness overlying the nails 1-5 bilaterally. There is no surrounding erythema or drainage along the nail sites. Minimal hyperkeratotic lesion plantar right foot without any underlying ulceration, drainage or signs of infection. No significant hyperkeratotic tissue to the digits.  Hammertoes evident. No pain with calf compression, swelling, warmth, erythema.  Assessment: Patient presents with symptomatic onychomycosis; hyperkeratotic lesion  Plan: -Treatment options including alternatives, risks, complications were discussed -Nails sharply debrided 10 without complication/bleeding. -Hyperkeratotic lesion sharply debrided x1 without complications or bleeding.  Recommended to continue with moisturizer daily.  -Discussed daily foot inspection. If there are any changes, to call the office immediately.  -Follow-up as scheduled or sooner if any problems are to arise. In the meantime, encouraged to call the office with any questions, concerns, changes symptoms.  Celesta Gentile, DPM

## 2020-10-23 ENCOUNTER — Other Ambulatory Visit: Payer: Self-pay | Admitting: Family Medicine

## 2020-11-14 ENCOUNTER — Telehealth: Payer: Self-pay | Admitting: Family Medicine

## 2020-11-14 NOTE — Telephone Encounter (Signed)
Recommending statin therapy because patient is a diabetic.

## 2020-11-17 ENCOUNTER — Other Ambulatory Visit: Payer: Self-pay | Admitting: Family Medicine

## 2020-12-04 NOTE — Telephone Encounter (Signed)
Please advise [facility] while I'm sure they mean well, pt has a h/o hepatotoxicity 2/2 statin use. This is a contraindications.  Thanks.

## 2020-12-17 ENCOUNTER — Telehealth: Payer: Self-pay | Admitting: Pharmacist

## 2020-12-17 NOTE — Chronic Care Management (AMB) (Signed)
Chronic Care Management Pharmacy Assistant   Name: Anita Mccormick  MRN: [id number] DOB: 01/21/1946  Anita Mccormick is an 75 y.o. year old female who presents for her initial CCM visit with the clinical pharmacist.  Reason for Encounter: Chart Prep for initial visit on 12/20/20.    Conditions to be addressed/monitored:   Primary concerns for visit include:   Recent office visits:  08/23/20 [name] MD(PCP) - seen for cough, rib pain on left side and GERD. Patient started on benzonatate 136m twice daily as needed and changed atenolol from 50 mg twice daily to 1061mtwice daily. Decreased omeprazole from 4061mo 35m61mily. Follow up as needed.   06/18/20 ShanGrier MittsPCP) - seen for left sided chest wall pain and other issues. No medication changes. Follow up as needed.   Recent consult visits:  10/22/20 MattCelesta Gentile ( Podiatry) - seen for diabetes mellitus without complication and other issues. No medication changes. Follow up as scheduled.   09/25/20 Courtney Forcucci Ollis PA-C (Gastrology) -  Seen for GERD and other chronic issues. Increased omeprazole to 40mg37mly. Follow up in 4 weeks.   08/28/20 Courtney Forcucci Ollis PA-C (Gastrology) -  Seen for LUQ pain. Started on omeprazole 35mg 31my. Follow up in 4 weeks if no relief from symptoms.   07/24/21 MattheCelesta Gentile Podiatry) -  Seen for pain in both lower legs and other issues. Patient started on gabapentin 100mg d6m at bedtime. Follow up in 3 months.   Hospital visits:  None in previous 6 months   Fill History:   atenolol (TENORMIN) tablet 08/18/2020 90   dicyclomine (BENTYL) 20 mg tablet 03/24/225/63/8937RULICITY 1.5 MG/0.6MDS/2.8JGN 11/12/2020   FUROSEMIDE 20 MG TABLET 03/20/2018 60   ONETOUCH ULTRA BLUE TEST STRP 08/03/2019 30   ONETOUCH ULTRASOFT LANCETS 02/18/2018 30   omeprazole (PRILOSEC) capsule 02/21/281/15/7262RULICITY 1.5 MG/0.6MMB/5.5HR6/22/2020 28   glipiZIDE (GLUCOTROL XL)  24 hr tablet 09/17/2020 90     Medications: Outpatient Encounter Medications as of 12/17/2020  Medication Sig  . aspirin 325 MG tablet Take 325 mg by mouth at bedtime.  . atenoMarland Kitchenol (TENORMIN) 100 MG tablet TAKE 1 TABLET BY MOUTH TWICE A DAY  . benzonatate (TESSALON) 100 MG capsule Take 1 capsule (100 mg total) by mouth 2 (two) times daily as needed for cough.  . Blood Glucose Monitoring Suppl (ONETOUCH ULTRALINK) w/Device KIT Test twice daily (onetouch ultra)  . Blood Pressure Monitoring (BLOOD PRESSURE CUFF) MISC Use daily to monitor blood pressue  . Calcium Carbonate-Vitamin D (CALCIUM PLUS VITAMIN D) 600-100 MG-UNIT CAPS Take 1 capsule by mouth 2 (two) times daily.  . furosemide (LASIX) 20 MG tablet Take 1 tablet (20 mg total) by mouth every other day. (Patient taking differently: Take 20 mg by mouth every other day. Pt reports she takes prn for swelling)  . gabapentin (NEURONTIN) 100 MG capsule Take 1 capsule (100 mg total) by mouth at bedtime.  . glipiMarland KitchenIDE (GLUCOTROL XL) 10 MG 24 hr tablet TAKE 1 TABLET BY MOUTH TWICE A DAY  . Lancets (ONETOUCH ULTRASOFT) lancets Use as instructed  . omeprazole (PRILOSEC) 20 MG capsule Take 1 capsule (20 mg total) by mouth daily. For heartburn.  . ONETOGlory Rosebushtest strip USE AS INSTRUCTED. FOR 3 TIMES A DAY TESTING, MORE FOR HYPO OR HYPERGLYCEMIA.  . potassium chloride SA (K-DUR,KLOR-CON) 20 MEQ tablet Take 1 tablet (20 mEq total) by mouth daily  as needed. Take only when taking lasix (Patient taking differently: Take 20 mEq by mouth daily as needed. Take prn only when taking lasix)  . TRULICITY 1.5 FX/9.0WI SOPN INJECT 1.5 MG INTO THE SKIN ONCE A WEEK.   No facility-administered encounter medications on file as of 12/17/2020.    Have you seen any other providers since your last visit?  Any changes in your medications or health?   Any side effects from any medications?   Do you have an symptoms or problems not managed by your medications?   Any  concerns about your health right now  Has your provider asked that you check blood pressure, blood sugar, or follow special diet at home?  Do you get any type of exercise on a regular basis?   Can you think of a goal you would like to reach for your health?   Do you have any problems getting your medications?   Is there anything that you would like to discuss during the appointment?   Please bring medications and supplements to appointment  Spoke with patient who stated that she needs to cancel her appointment with Jeni Salles on the 5/26 and could not reschedule at this time. Patient stated she could not complete call because she's cooking dinner and was about to burn her food. Message sent to Jeni Salles to cancel.   Star Rating Drugs:   Gl[facility] 21m [facility]  trulicity 15.3GD- [facility]20 [facility]DS [facility] [phone]

## 2020-12-18 NOTE — Telephone Encounter (Cosign Needed)
2 attempts.

## 2020-12-20 ENCOUNTER — Ambulatory Visit: Payer: Medicare HMO

## 2020-12-21 ENCOUNTER — Encounter: Payer: Self-pay | Admitting: Podiatry

## 2020-12-26 ENCOUNTER — Telehealth: Payer: Self-pay | Admitting: Family Medicine

## 2020-12-26 NOTE — Progress Notes (Signed)
Rescheduled appointment that was canceled by patient.   Noelle Penner [employer] Scheduler

## 2021-01-17 ENCOUNTER — Other Ambulatory Visit: Payer: Self-pay

## 2021-01-17 ENCOUNTER — Ambulatory Visit (INDEPENDENT_AMBULATORY_CARE_PROVIDER_SITE_OTHER): Payer: Medicare HMO | Admitting: Podiatry

## 2021-01-17 DIAGNOSIS — E119 Type 2 diabetes mellitus without complications: Secondary | ICD-10-CM

## 2021-01-17 DIAGNOSIS — M79675 Pain in left toe(s): Secondary | ICD-10-CM | POA: Diagnosis not present

## 2021-01-17 DIAGNOSIS — M79674 Pain in right toe(s): Secondary | ICD-10-CM | POA: Diagnosis not present

## 2021-01-17 DIAGNOSIS — B351 Tinea unguium: Secondary | ICD-10-CM

## 2021-01-22 ENCOUNTER — Ambulatory Visit: Payer: Medicare HMO | Admitting: Podiatry

## 2021-01-23 NOTE — Progress Notes (Signed)
Subjective: 75 y.o. returns the office today for painful, elongated, thickened toenails which she cannot trim herself.   Denies any open lesions.  Denies any acute changes since last appointment and no new complaints today. Denies any systemic complaints such as fevers, chills, nausea, vomiting.   PCP: Billie Ruddy, MD  Objective: AAO 3, NAD DP/PT pulses palpable, CRT less than 3 seconds Nails hypertrophic, dystrophic, elongated, brittle, discolored 10. There is tenderness overlying the nails 1-5 bilaterally. There is no surrounding erythema or drainage along the nail sites. Slight hyperkeratotic lesion plantar right foot without any underlying ulceration, drainage or signs of infection. No significant hyperkeratotic tissue to the digits.  Hammertoes evident. No pain with calf compression, swelling, warmth, erythema.  Assessment: Patient presents with symptomatic onychomycosis; hyperkeratotic lesion  Plan: -Treatment options including alternatives, risks, complications were discussed -Nails sharply debrided 10 without complication/bleeding. -Hyperkeratotic lesion sharply debrided x1 without complications or bleeding as a courtesy. Recommended to continue with moisturizer daily.  -Discussed daily foot inspection. If there are any changes, to call the office immediately.  -Follow-up as scheduled or sooner if any problems are to arise. In the meantime, encouraged to call the office with any questions, concerns, changes symptoms.  Celesta Gentile, DPM

## 2021-01-31 DIAGNOSIS — E119 Type 2 diabetes mellitus without complications: Secondary | ICD-10-CM | POA: Diagnosis not present

## 2021-02-20 ENCOUNTER — Telehealth: Payer: Self-pay | Admitting: Pharmacist

## 2021-02-20 NOTE — Chronic Care Management (AMB) (Signed)
Chronic Care Management Pharmacy Assistant   Name: Anita Mccormick  MRN: [id number] DOB: Jul 02, 1946   Reason for Encounter: General Assessment Call   Conditions to be addressed/monitored: DMII  Recent office visits:  None  Recent consult visits:  01-17-2021 Anita Mccormick [facility] - Patient presented for routine foot care. No medication changes.  Hospital visits:  None in previous 6 months  Medications: Outpatient Encounter Medications as of 02/20/2021  Medication Sig   aspirin 325 MG tablet Take 325 mg by mouth at bedtime.   atenolol (TENORMIN) 100 MG tablet TAKE 1 TABLET BY MOUTH TWICE A DAY   benzonatate (TESSALON) 100 MG capsule Take 1 capsule (100 mg total) by mouth 2 (two) times daily as needed for cough.   Blood Glucose Monitoring Suppl (ONETOUCH ULTRALINK) w/Device KIT Test twice daily (onetouch ultra)   Blood Pressure Monitoring (BLOOD PRESSURE CUFF) MISC Use daily to monitor blood pressue   Calcium Carbonate-Vitamin D (CALCIUM PLUS VITAMIN D) 600-100 MG-UNIT CAPS Take 1 capsule by mouth 2 (two) times daily.   dicyclomine (BENTYL) 20 MG tablet TAKE 1 TABLET BY MOUTH EVERY 6 HOURS AS NEEDED FOR UP TO 30 DAYS (ABDOMINAL PAIN AND CRAMPING).   furosemide (LASIX) 20 MG tablet Take 1 tablet (20 mg total) by mouth every other day. (Patient taking differently: Take 20 mg by mouth every other day. Pt reports she takes prn for swelling)   gabapentin (NEURONTIN) 100 MG capsule Take 1 capsule (100 mg total) by mouth at bedtime.   glipiZIDE (GLUCOTROL XL) 10 MG 24 hr tablet TAKE 1 TABLET BY MOUTH TWICE A DAY   Lancets (ONETOUCH ULTRASOFT) lancets Use as instructed   omeprazole (PRILOSEC) 20 MG capsule Take 1 capsule (20 mg total) by mouth daily. For heartburn.   ONETOUCH ULTRA test strip USE AS INSTRUCTED. FOR 3 TIMES A DAY TESTING, MORE FOR HYPO OR HYPERGLYCEMIA.   potassium chloride SA (K-DUR,KLOR-CON) 20 MEQ tablet Take 1 tablet (20 mEq total) by mouth daily as needed. Take only  when taking lasix (Patient taking differently: Take 20 mEq by mouth daily as needed. Take prn only when taking lasix)   TRULICITY 1.5 [place]/7.0WC SOPN INJECT 1.5 MG INTO THE SKIN ONCE A WEEK.   No facility-administered encounter medications on file as of 02/20/2021.   Recent Relevant Labs: Lab Results  Component Value Date/Time   HGBA1C 6.5 (H) 05/10/2020 02:02 PM   HGBA1C 6.0 (A) 12/01/2019 03:54 PM   HGBA1C 6.1 (A) 06/08/2019 01:26 PM   HGBA1C 6.8 (H) 03/18/2017 08:51 AM   MICROALBUR <0.7 06/08/2019 12:55 PM   MICROALBUR 0.2 04/11/2014 11:36 AM    Kidney Function Lab Results  Component Value Date/Time   CREATININE 0.98 11/16/2017 06:00 PM   CREATININE 1.07 11/05/2017 03:14 PM   CREATININE 0.91 08/07/2017 03:45 PM   CREATININE 0.85 07/11/2016 03:33 PM   GFR 65.01 11/05/2017 03:14 PM   GFRNONAA 57 (L) 11/16/2017 06:00 PM   GFRAA >60 11/16/2017 06:00 PM    Current antihyperglycemic regimen:  Glipizide 11m - Take one tablet two times a day Trulicity 13.7SE- Inject into the skin once a week What recent interventions/DTPs have been made to improve glycemic control:  Patient reports none Have there been any recent hospitalizations or ED visits since last visit with CPP? No Patient reports hypoglycemic symptoms, including Vision changes Patient reports she is having cataract surgery soon and does not see well from that eye. Patient reports hyperglycemic symptoms, including weakness Patient reports her  legs are often week and she is always tired. How often are you checking your blood sugar? Patient reports she is not checking regularly does check at least once a week. What are your blood sugars ranging?  After meals: 115 was last weeks reading done after eating During the week, how often does your blood glucose drop below 70? Patient reports it has not been that low since starting Trulicity. Are you checking your feet daily/regularly? Patient reports she checks her feet often and  regularly sees her podiatrist.  Adherence Review: Is the patient currently on a STATIN medication? No Is the patient currently on ACE/ARB medication? No Does the patient have >5 day gap between last estimated fill dates? No  Notes: Patient reports she has increased her water amounts as she noticed a smell in her urine that has since improved. Patient reports no other symptoms.  Care Gaps:  CCM F/U call - Scheduled 02/27/21 11am AWV Overdue - MSG sent to [name] CMA to schedule.  Zoster Vaccine - Overdue Eye Exam - Overdue Foot Exam- Complete COVID Booster #4 ([place]) - Overdue HGB A1C - Overdue  Star Rating Drugs: Glipizide 49m - [facility] 19.2BB- last [facility] on 12/17/2020 28DS [facility] [phone]

## 2021-02-26 ENCOUNTER — Telehealth: Payer: Self-pay | Admitting: Pharmacist

## 2021-02-26 NOTE — Chronic Care Management (AMB) (Signed)
Chronic Care Management Pharmacy Assistant   Name: Anita Mccormick  MRN: [id number] DOB: Nov 11, 1945  Anita Mccormick is an 75 y.o. year old female who presents for her initial CCM visit with the clinical pharmacist.  Reason for Encounter: Chart Prep for initial visit on 02/27/21 with Jeni Salles the clinical pharmacist.    Conditions to be addressed/monitored: CHF, HTN, HLD, DMII, CKD Stage 2, and Insomnia, Anemia and Cirrhosis of liver without ascites.  Primary concerns for visit include: Patient has been having a lot of weakness.  Recent office visits:  08/23/20 [name] MD(PCP) - seen for cough, rib pain on left side and GERD. Patient started on benzonatate 165m twice daily as needed and changed atenolol from 50 mg twice daily to 1025mtwice daily. Decreased omeprazole from 4018mo 44m64mily. Follow up as needed.   06/18/20 ShanGrier MittsPCP) - seen for left sided chest wall pain and other issues. No medication changes. Follow up as needed  Recent consult visits:  01/17/21 MattCelesta Gentile (Podiatry) - presented to clinic for diabetes mellitus without complication and routine foot care. No medication changes. Follow up in 3 months.   10/22/20 MattCelesta Gentile ( Podiatry) - seen for diabetes mellitus without complication and other issues. No medication changes. Follow up as scheduled.   09/25/20 Courtney Forcucci Ollis PA-C (Gastrology) -  Seen for GERD and other chronic issues. Increased omeprazole to 40mg64mly. Follow up in 4 weeks.   08/28/20 Courtney Forcucci Ollis PA-C (Gastrology) -  Seen for LUQ pain. Started on omeprazole 44mg 43my. Follow up in 4 weeks if no relief from symptoms.   07/24/21 MattheCelesta Gentile Podiatry) -  Seen for pain in both lower legs and other issues. Patient started on gabapentin 100mg d23m at bedtime. Follow up in 3 months.  Hospital visits:  None in previous 6 months  Medications: Outpatient Encounter Medications as of 02/26/2021   Medication Sig   aspirin 325 MG tablet Take 325 mg by mouth at bedtime.   atenolol (TENORMIN) 100 MG tablet TAKE 1 TABLET BY MOUTH TWICE A DAY   benzonatate (TESSALON) 100 MG capsule Take 1 capsule (100 mg total) by mouth 2 (two) times daily as needed for cough.   Blood Glucose Monitoring Suppl (ONETOUCH ULTRALINK) w/Device KIT Test twice daily (onetouch ultra)   Blood Pressure Monitoring (BLOOD PRESSURE CUFF) MISC Use daily to monitor blood pressue   Calcium Carbonate-Vitamin D (CALCIUM PLUS VITAMIN D) 600-100 MG-UNIT CAPS Take 1 capsule by mouth 2 (two) times daily.   dicyclomine (BENTYL) 20 MG tablet TAKE 1 TABLET BY MOUTH EVERY 6 HOURS AS NEEDED FOR UP TO 30 DAYS (ABDOMINAL PAIN AND CRAMPING).   furosemide (LASIX) 20 MG tablet Take 1 tablet (20 mg total) by mouth every other day. (Patient taking differently: Take 20 mg by mouth every other day. Pt reports she takes prn for swelling)   gabapentin (NEURONTIN) 100 MG capsule Take 1 capsule (100 mg total) by mouth at bedtime.   glipiZIDE (GLUCOTROL XL) 10 MG 24 hr tablet TAKE 1 TABLET BY MOUTH TWICE A DAY   Lancets (ONETOUCH ULTRASOFT) lancets Use as instructed   omeprazole (PRILOSEC) 20 MG capsule Take 1 capsule (20 mg total) by mouth daily. For heartburn.   ONETOUCH ULTRA test strip USE AS INSTRUCTED. FOR 3 TIMES A DAY TESTING, MORE FOR HYPO OR HYPERGLYCEMIA.   potassium chloride SA (K-DUR,KLOR-CON) 20 MEQ tablet Take 1 tablet (20 mEq total) by mouth daily  as needed. Take only when taking lasix (Patient taking differently: Take 20 mEq by mouth daily as needed. Take prn only when taking lasix)   TRULICITY 1.5 AL/9.3XT SOPN INJECT 1.5 MG INTO THE SKIN ONCE A WEEK.   No facility-administered encounter medications on file as of 02/26/2021.   Fill History: atenolol (TENORMIN) tablet 02/40/9735 90   TRULICITY 1.5 HG/9.9ME [place] SOPN 12/17/2020 28   omeprazole (PRILOSEC) capsule 09/17/2020 90   glipiZIDE (GLUCOTROL XL) 24 hr tablet 12/13/2020 90     Initial Questions: Have you seen any other providers since your last visit? Yes. Patient has seen her dentist and her foot doctor. She goes back to get a tooth pulled this month.   Any changes in your medications or health? No changes to her medications. No changes in her health except for a new cataract has came up on her eye.  Any side effects from any medications? Patient states she has not had had any side effects or issues from her medications but she stays weak a lot and cant do a lot of walking without getting short of breath.  Do you have an symptoms or problems not managed by your medications? Patient believes she needs something different for her blood sugar and diabetes. She does not feel her medications are helping. She states she don't check her blood sugar like she should but when she urinates she can smell the glucose in her urine.   Any concerns about your health right now? Patient is concerned about her weakness.   Has your provider asked that you check blood pressure, blood sugar, or follow special diet at home? Yes. Patient is suppose to check her blood sugar, blood pressure and she follows a special diet.   Do you get any type of exercise on a regular basis? Patient does some chair exercises at home and stretches. Patient cannot walk far distances.   Can you think of a goal you would like to reach for your health? Patient just wants to feel better and build some stamina to be able to do more around her house for herself.   Do you have any problems getting your medications? No issues at this time. Patient wishes her Trulicity was less. She believes she already receives patient assistance.   Is there anything that you would like to discuss during the appointment? Patient would like to discuss what else she can do for her weakness and look into getting her Trulicity cheaper.   Please bring medications and supplements to appointment.  Care Gaps:  AWV- message sent to  [name] to schedule. Zoster Vaccines -  never done Opthalmology exam - overdue since 04/27/18 Foot exam - overdue since 06/17/19 Covid- 19 vaccine - booster 4 overdue since 09/12/20 Hemoglobin A1C - overdue since 11/08/20 Influenza vaccine - overdue since 02/25/21   Star Rating Drugs:  Glipizide 65m - [facility]LN- [facility] on 12/17/20 28DS [facility] [phone]

## 2021-02-27 ENCOUNTER — Ambulatory Visit (INDEPENDENT_AMBULATORY_CARE_PROVIDER_SITE_OTHER): Payer: Medicare HMO | Admitting: Pharmacist

## 2021-02-27 DIAGNOSIS — I10 Essential (primary) hypertension: Secondary | ICD-10-CM | POA: Diagnosis not present

## 2021-02-27 DIAGNOSIS — K7460 Unspecified cirrhosis of liver: Secondary | ICD-10-CM

## 2021-02-27 DIAGNOSIS — E1129 Type 2 diabetes mellitus with other diabetic kidney complication: Secondary | ICD-10-CM

## 2021-02-27 NOTE — Progress Notes (Signed)
Chronic Care Management Pharmacy Note  03/20/2021 Name:  Anita Mccormick MRN:  [id number] DOB:  [birth year]  Summary: Pt notes more SOB lately A1c at goal < 7%  Recommendations/Changes made from today's visit: -Recommend repeat lipid panel as this is way overdue -Consider repeat Echo as patient reports SOB has worsened and follow up with cardiology -Consider decreasing aspirin therapy to 81 mg daily  Plan: Follow up DM assessment in 1-2 months   Subjective: Anita Mccormick is an 75 y.o. year old female who is a primary patient of Billie Ruddy, MD.  The CCM team was consulted for assistance with disease management and care coordination needs.    Engaged with patient by telephone for initial visit in response to provider referral for pharmacy case management and/or care coordination services.   Consent to Services:  The patient was given the following information about Chronic Care Management services today, agreed to services, and gave verbal consent: 1. CCM service includes personalized support from designated clinical staff supervised by the primary care provider, including individualized plan of care and coordination with other care providers 2. 24/7 contact phone numbers for assistance for urgent and routine care needs. 3. Service will only be billed when office clinical staff spend 20 minutes or more in a month to coordinate care. 4. Only one practitioner may furnish and bill the service in a calendar month. 5.The patient may stop CCM services at any time (effective at the end of the month) by phone call to the office staff. 6. The patient will be responsible for cost sharing (co-pay) of up to 20% of the service fee (after annual deductible is met). Patient agreed to services and consent obtained.  Patient Care Team: Billie Ruddy, MD as PCP - General (Family Medicine) Martinique, Peter M, MD as PCP - Cardiology (Cardiology) Warden Fillers, MD as Consulting Physician  (Ophthalmology) Viona Gilmore, [facility] as Pharmacist (Pharmacist)  Recent office visits: 08/23/20 [name] MD(PCP) - seen for cough, rib pain on left side and GERD. Patient started on benzonatate 141m twice daily as needed and changed atenolol from 50 mg twice daily to 1061mtwice daily. Decreased omeprazole from 4037mo 22m54mily. Follow up as needed.   06/18/20 ShanGrier MittsPCP) - seen for left sided chest wall pain and other issues. No medication changes. Follow up as needed  Recent consult visits: 01/17/21 MattCelesta Gentile (Podiatry) - presented to clinic for diabetes mellitus without complication and routine foot care. No medication changes. Follow up in 3 months.    10/22/20 MattCelesta Gentile ( Podiatry) - seen for diabetes mellitus without complication and other issues. No medication changes. Follow up as scheduled.   09/25/20 Courtney Forcucci Ollis PA-C (Gastrology) -  Seen for GERD and other chronic issues. Increased omeprazole to 40mg74mly. Follow up in 4 weeks.   08/28/20 Courtney Forcucci Ollis PA-C (Gastrology) -  Seen for LUQ pain. Started on omeprazole 22mg 77my. Follow up in 4 weeks if no relief from symptoms.   07/24/21 MattheCelesta Gentile Podiatry) -  Seen for pain in both lower legs and other issues. Patient started on gabapentin 100mg d33m at bedtime. Follow up in 3 months.    Hospital visits: None in previous 6 months   Objective:  Lab Results  Component Value Date   CREATININE 1.09 03/07/2021   BUN 15 03/07/2021   GFR 50.10 (L) 03/07/2021   GFRNONAA 57 (L) 11/16/2017   GFRAA >60 11/16/2017  NA 139 03/07/2021   K 4.1 03/07/2021   CALCIUM 9.7 03/07/2021   CO2 27 03/07/2021   GLUCOSE 108 (H) 03/07/2021    Lab Results  Component Value Date/Time   HGBA1C 6.1 (A) 03/07/2021 03:18 PM   HGBA1C 6.5 (H) 05/10/2020 02:02 PM   HGBA1C 6.0 (A) 12/01/2019 03:54 PM   HGBA1C 6.8 (H) 03/18/2017 08:51 AM   GFR 50.10 (L) 03/07/2021 03:50 PM   GFR 65.01  11/05/2017 03:14 PM   MICROALBUR <0.7 06/08/2019 12:55 PM   MICROALBUR 0.2 04/11/2014 11:36 AM    Last diabetic Eye exam:  Lab Results  Component Value Date/Time   HMDIABEYEEXA No Retinopathy 01/14/2016 12:00 AM    Last diabetic Foot exam:  Lab Results  Component Value Date/Time   HMDIABFOOTEX done 07/28/2013 12:00 AM     Lab Results  Component Value Date   CHOL 146 03/18/2017   HDL 49.80 03/18/2017   LDLCALC 65 03/18/2017   LDLDIRECT 95.8 07/02/2006   TRIG 154.0 (H) 03/18/2017   CHOLHDL 3 03/18/2017    Hepatic Function Latest Ref Rng & Units 03/07/2021 09/19/2017 08/07/2017  Total Protein 6.0 - 8.3 g/dL 6.8 7.2 7.5  Albumin 3.5 - 5.2 g/dL 3.6 2.7(L) -  AST 0 - 37 U/L 22 68(H) 60(H)  ALT 0 - 35 U/L _0 Alk Phosphatase 39 - 117 U/L 114 194(H) -  Total Bilirubin 0.2 - 1.2 mg/dL 0.7 1.1 1.0  Bilirubin, Direct 0.0 - 0.3 mg/dL - - -    Lab Results  Component Value Date/Time   TSH 1.32 04/11/2014 11:36 AM   TSH 0.96 03/14/2009 10:16 AM    CBC Latest Ref Rng & Units 03/07/2021 05/10/2020 11/17/2017  WBC 4.0 - 10.5 K/uL 6.1 5.4 5.7  Hemoglobin 12.0 - 15.0 g/dL 8.0 Repeated and verified X2.(LL) 11.7 8.5(L)  Hematocrit 36.0 - 46.0 % 25.9(L) 35.3 27.1(L)  Platelets 150.0 - 400.0 K/uL 117.0(L) 107(L) 130(L)    Lab Results  Component Value Date/Time   VD25OH CANCELED 05/10/2020 02:01 PM    Clinical ASCVD: Yes  The ASCVD Risk score Mikey Bussing DC Jr., et al., 2013) failed to calculate for the following reasons:   Cannot find a previous HDL lab   Cannot find a previous total cholesterol lab    Depression screen [facility] 2/9 03/18/2017 12/19/2015 09/14/2015  Decreased Interest 0 0 0  Down, Depressed, Hopeless 0 0 0  PHQ - 2 Score 0 0 0  Some recent data might be hidden      Social History   Tobacco Use  Smoking Status Every Day   Packs/day: 0.10   Types: Cigarettes  Smokeless Tobacco Never  Tobacco Comments   3 cigs a day :not smoking due to illness (01/17/17)   BP  Readings from Last 3 Encounters:  03/07/21 138/60  08/23/20 132/80  06/18/20 138/80   Pulse Readings from Last 3 Encounters:  03/07/21 83  08/23/20 64  06/18/20 94   Wt Readings from Last 3 Encounters:  03/07/21 151 lb 6.4 oz (68.7 kg)  08/23/20 149 lb 12.8 oz (67.9 kg)  06/18/20 150 lb 12.8 oz (68.4 kg)   BMI Readings from Last 3 Encounters:  03/07/21 27.69 kg/m  08/23/20 27.40 kg/m  06/18/20 27.58 kg/m    Assessment/Interventions: Review of patient past medical history, allergies, medications, health status, including review of consultants reports, laboratory and other test data, was performed as part of comprehensive evaluation and provision of chronic care management services.   SDOH:  (  Social Determinants of Health) assessments and interventions performed: Yes SDOH Interventions    Flowsheet Row Most Recent Value  SDOH Interventions   Financial Strain Interventions Intervention Not Indicated  Transportation Interventions Intervention Not Indicated      SDOH Screenings   Alcohol Screen: Not on file  Depression (PHQ2-9): Not on file  Financial Resource Strain: Low Risk    Difficulty of Paying Living Expenses: Not very hard  Food Insecurity: Not on file  Housing: Not on file  Physical Activity: Not on file  Social Connections: Not on file  Stress: Not on file  Tobacco Use: High Risk   Smoking Tobacco Use: Every Day   Smokeless Tobacco Use: Never  Transportation Needs: No Transportation Needs   Lack of Transportation (Medical): No   Lack of Transportation (Non-Medical): No   Patient does some chair exercises at home and stretches. Patient cannot walk far distances.   Patient just wants to feel better and build some stamina to be able to do more around her house for herself.   No issues at this time. Patient wishes her Trulicity was less. She believes she already receives patient assistance.   Patient usually starts her day off checking her blood sugar  first before she eats. Then she takes her medications afterwards. She can't walk that well and can't do a lot of cleaning and lifting. She has 3 grandchildren 60, 8 and 8 and they help out with housework. Patient has custody of her grandchildren and their dad helps some too. Their mom left a while ago. Patients leg's feel weak and she feels tired often and this has been going on since 2009. Her breathlessness has gotten worse.  Patient is not following up with cardiology regularly. Patient notices some fluid build up in her legs in the evening. Patient is not taking the furosemide anymore as her legs are swollen as bad. Patient has lost so much weight and hasn't weighed herself lately.  Patient is still driving and just had an eye exam. They just told her that she has a small cataract. She is going for surgery this month.   Patient is not aware of any side effects of medications. Patient reports the cost of Trulicity is high and she is paying $245 right now.    CCM Care Plan  Allergies  Allergen Reactions   Ace Inhibitors Cough    ????   Chlorthalidone     REACTION: unspecified   Metformin     REACTION: gi side effects   Penicillins     REACTION: urticaria (hives)   Sulfamethoxazole     REACTION: questionable    Medications Reviewed Today     Reviewed by Billie Ruddy, MD (Physician) on 03/18/21 at [phone]  Med List Status: <None>   Medication Order Taking? Sig Documenting Provider Last Dose Status Informant  aspirin 325 MG tablet [id number] No Take 325 mg by mouth at bedtime.  Patient not taking: No sig reported   [provider] Not Taking Active   atenolol (TENORMIN) 100 MG tablet [id number] Yes TAKE 1 TABLET BY MOUTH TWICE A DAY Billie Ruddy, MD Taking Active   Blood Glucose Monitoring Suppl [facility] Karsten Fells) w/Device Drucie Opitz [id number] Yes Test twice daily (onetouch ultra) Billie Ruddy, MD Taking Active Self  Blood Pressure Monitoring (BLOOD PRESSURE CUFF) MISC  [id number] Yes Use daily to monitor blood pressue Billie Ruddy, MD Taking Active Self  Calcium Carbonate-Vitamin D (CALCIUM PLUS VITAMIN D) 600-100 MG-UNIT CAPS [id number] Yes  Take 1 capsule by mouth 2 (two) times daily. [provider] Taking Active Self  furosemide (LASIX) 20 MG tablet [id number]  Take 1 tablet (20 mg total) by mouth daily for 3 days. Billie Ruddy, MD  Expired 03/11/21 2359   glipiZIDE (GLUCOTROL XL) 10 MG 24 hr tablet [id number] Yes TAKE 1 TABLET BY MOUTH TWICE A DAY Billie Ruddy, MD Taking Active   Lancets [facility] ULTRASOFT) lancets [id number] Yes Use as instructed Billie Ruddy, MD Taking Active   [facility] ULTRA test strip [id number] Yes USE AS INSTRUCTED. FOR 3 TIMES A DAY TESTING, MORE FOR HYPO OR HYPERGLYCEMIA. Billie Ruddy, MD Taking Active   potassium chloride SA (KLOR-CON) 20 MEQ tablet [id number]  Take 1 tablet (20 mEq total) by mouth daily. Billie Ruddy, MD  Active   TRULICITY 1.5 IR/4.8NI Bon[place] Aid [id number] Yes INJECT 1.5 MG INTO THE SKIN ONCE A WEEK. Billie Ruddy, MD Taking Active             Patient Active Problem List   Diagnosis Date Noted   Atherosclerosis of aorta ([place]) 05/18/2020   Cirrhosis of liver without ascites ([place]) 05/18/2020   Dysuria 02/20/2018   Anemia    Sepsis due to Escherichia coli (E. coli) ([place])    AKI (acute kidney injury) ([place])    Sepsis ([place]) 09/19/2017   UTI (urinary tract infection) 09/19/2017   Chronic kidney disease (CKD), stage II (mild) 09/19/2017   Leukopenia 09/19/2017   Chronic diastolic CHF (congestive heart failure) ([place]) 09/19/2017   Statins contraindicated 06/12/2017   Hepatotoxicity due to statin drug 06/12/2017   History of adenomatous polyp of colon 03/18/2017   Viral URI with cough 01/17/2017   Thrombocytopenia ([place]) 12/19/2015   Insomnia 08/21/2015   Nicotine use disorder 05/21/2015   Eczema 05/21/2015   Fatty liver disease, nonalcoholic 38/18/2993   Hyperlipidemia 11/03/2007    History of CVA (cerebrovascular accident) 10/20/2007   Type II diabetes mellitus with renal manifestations ([place]) 12/10/2006   Essential hypertension 12/10/2006   History of breast cancer 12/10/2006    Immunization History  Administered Date(s) Administered   Fluad Quad(high Dose 65+) 07/05/2020   Influenza Split 04/30/2012   Influenza Whole 06/01/2007, 04/27/2008, 06/05/2009, 05/02/2010, 04/28/2011   Influenza, High Dose Seasonal PF 03/14/2015, 04/04/2016, 04/30/2017, 04/30/2018, 05/04/2019   Influenza,inj,Quad PF,6+ Mos 03/23/2013   Influenza-Unspecified 04/11/2014   PFIZER(Purple Top)SARS-COV-2 Vaccination 09/25/2019, 10/25/2019, 05/12/2020   Pneumococcal Conjugate-13 06/30/2014   Pneumococcal Polysaccharide-23 03/21/2009, 08/21/2015   Tdap 09/22/2011   Zoster, Live 03/19/2011    Conditions to be addressed/monitored:  Hypertension, Hyperlipidemia, Diabetes, Heart Failure, GERD, and Chronic Kidney Disease  Care Plan : [place]  Updates made by Viona Gilmore, [place] since 03/20/2021 12:00 AM     Problem: Problem: Hypertension, Hyperlipidemia, Diabetes, Heart Failure, GERD, and Chronic Kidney Disease      Long-Range Goal: Patient-Specific Goal   Start Date: 02/27/2021  Expected End Date: 02/27/2022  This Visit's Progress: On track  Priority: High  Note:   Current Barriers:  Unable to independently monitor therapeutic efficacy  Pharmacist Clinical Goal(s):  Patient will achieve adherence to monitoring guidelines and medication adherence to achieve therapeutic efficacy through collaboration with PharmD and provider.   Interventions: 1:1 collaboration with Billie Ruddy, MD regarding development and update of comprehensive plan of care as evidenced by provider attestation and co-signature Inter-disciplinary care team collaboration (see longitudinal plan of care) Comprehensive medication review performed; medication list updated in electronic medical  record  Hypertension (BP goal <140/90) -Controlled -Current treatment: Atenolol 100 mg 1 tablet twice daily Furosemide 20 mg as needed -Medications previously tried: chlorthalidone, ACE inhibitor  -Current home readings: 112/64 -Current dietary habits: limits salt intake -Current exercise habits: no structured exercise -Denies hypotensive/hypertensive symptoms -Educated on BP goals and benefits of medications for prevention of heart attack, stroke and kidney damage; Importance of home blood pressure monitoring; Proper BP monitoring technique; -Counseled to monitor BP at home weekly, document, and provide log at future appointments -Counseled on diet and exercise extensively Recommended to continue current medication  Hyperlipidemia: (LDL goal < 70) -Controlled -Current treatment: No medications -Medications previously tried: statins (elevated LFTs)  -Current dietary patterns: did not discuss -Current exercise habits: no formal exercise -Educated on Cholesterol goals;  Exercise goal of 150 minutes per week; -Counseled on diet and exercise extensively Recommended repeat lipid panel  Diabetes (A1c goal <7%) -Controlled -Current medications: Trulicity 1.5 inject 1.5 mg once a week Glipizide XL 10 mg 1 tablet twice a daily  -Medications previously tried: metformin (diarrhea)  -Current home glucose readings fasting glucose: 200; 160 or lower; 124, 130  post prandial glucose: not much -Denies hypoglycemic/hyperglycemic symptoms -Current meal patterns:  breakfast: grapefruits with oatmeal, dried raisins and cranberries ([employer]), sausage lunch: salad, chicken salad with crackers  dinner: pasta and shrimp or salmon with vegetable snacks: ritz crackers or potato chips; corn chips; donut; sugarless drinks drinks: water; diet soda -Current exercise: no structured exercise -Educated on A1c and blood sugar goals; Complications of diabetes including kidney damage, retinal damage,  and cardiovascular disease; Exercise goal of 150 minutes per week; Carbohydrate counting and/or plate method -Counseled to check feet daily and get yearly eye exams -Counseled on diet and exercise extensively Recommended to continue current medication  Heart Failure (Goal: manage symptoms and prevent exacerbations) -Not ideally controlled -Last ejection fraction: 60-65% (Date: 08/11/2017) -HF type: Diastolic -NYHA Class: II (slight limitation of activity) -AHA HF Stage: B (Heart disease present - no symptoms present) -Current treatment: Furosemide 20 mg 1 tablet every other day as needed Potassium chloride 20 mEq tablet daily -Medications previously tried: none  -Current home BP/HR readings: refer to above -Current dietary habits: limits salt intake -Current exercise habits: minimal -Educated on Importance of weighing daily; if you gain more than 3 pounds in one day or 5 pounds in one week, call PCP Proper diuretic administration and potassium supplementation -Counseled on diet and exercise extensively Recommended to continue current medication  GERD (Goal: minimize symptoms) -Controlled -Current treatment  Omeprazole 20 mg daily -Medications previously tried: none  - Reassess the need for medication at follow up.  Neuropathy (Goal: minimize nerve pain/tingling) -Controlled -Current treatment  Gabapentin 100 mg daily -Medications previously tried: none  -Recommended to continue current medication  History of CVA (Goal: prevent future strokes) -Not ideally controlled -Current treatment  Aspirin 325 mg 1 tablet daily  -Medications previously tried: none  -Recommended decreasing to aspirin 81 mg daily.   Health Maintenance -Vaccine gaps: shingrix -Current therapy:  None -Educated on Cost vs benefit of each product must be carefully weighed by individual consumer -Patient is satisfied with current therapy and denies issues -Recommended to continue as is  Patient  Goals/Self-Care Activities Patient will:  - take medications as prescribed check glucose daily, document, and provide at future appointments check blood pressure weekly, document, and provide at future appointments target a minimum of 150 minutes of moderate intensity exercise weekly  Follow Up Plan: Telephone follow up appointment  with care management team member scheduled for: 4 months       Medication Assistance: None required.  Patient affirms current coverage meets needs.  Compliance/Adherence/Medication fill history: Care Gaps: Shingrix, eye exam, foot exam, COVID booster, A1c, influenza vaccine  Star-Rating Drugs: Glipizide 55m - [facility] 19.3[facility]B- [facility]17/20 [facility]  [facility]  [id number] [place] [id number]: [phone]: [phone] pill box? Yes - weekly pill box Pt endorses 99% compliance - once in the last few months  We discussed: Current pharmacy is preferred with insurance plan and patient is satisfied with pharmacy services Patient decided to: Continue current medication management strategy  Care Plan and Follow Up Patient Decision:  Patient agrees to Care Plan and Follow-up.  Plan: Telephone follow up appointment with care management team member scheduled for:  4 months  MaJeni SallesPharmD, BCCaledoniaharmacist Le[place] BrBuffalo [phone]

## 2021-03-05 DIAGNOSIS — E119 Type 2 diabetes mellitus without complications: Secondary | ICD-10-CM | POA: Diagnosis not present

## 2021-03-05 DIAGNOSIS — Z961 Presence of intraocular lens: Secondary | ICD-10-CM | POA: Diagnosis not present

## 2021-03-05 DIAGNOSIS — H26492 Other secondary cataract, left eye: Secondary | ICD-10-CM | POA: Diagnosis not present

## 2021-03-05 DIAGNOSIS — H18513 Endothelial corneal dystrophy, bilateral: Secondary | ICD-10-CM | POA: Diagnosis not present

## 2021-03-06 ENCOUNTER — Telehealth: Payer: Self-pay | Admitting: Family Medicine

## 2021-03-06 NOTE — Telephone Encounter (Signed)
Tried calling patient a child answered phone stated patient was not there and hung up.  Please r/s awv appointment Mickel Baas will be out of office

## 2021-03-07 ENCOUNTER — Other Ambulatory Visit: Payer: Self-pay

## 2021-03-07 ENCOUNTER — Other Ambulatory Visit: Payer: Self-pay | Admitting: Family Medicine

## 2021-03-07 ENCOUNTER — Ambulatory Visit (INDEPENDENT_AMBULATORY_CARE_PROVIDER_SITE_OTHER): Payer: Medicare HMO | Admitting: Family Medicine

## 2021-03-07 ENCOUNTER — Ambulatory Visit: Payer: Medicare HMO

## 2021-03-07 VITALS — BP 138/60 | HR 83 | Temp 98.7°F | Wt 151.4 lb

## 2021-03-07 DIAGNOSIS — R0600 Dyspnea, unspecified: Secondary | ICD-10-CM

## 2021-03-07 DIAGNOSIS — I10 Essential (primary) hypertension: Secondary | ICD-10-CM | POA: Diagnosis not present

## 2021-03-07 DIAGNOSIS — R8290 Unspecified abnormal findings in urine: Secondary | ICD-10-CM

## 2021-03-07 DIAGNOSIS — E1129 Type 2 diabetes mellitus with other diabetic kidney complication: Secondary | ICD-10-CM | POA: Diagnosis not present

## 2021-03-07 DIAGNOSIS — R0609 Other forms of dyspnea: Secondary | ICD-10-CM

## 2021-03-07 DIAGNOSIS — K760 Fatty (change of) liver, not elsewhere classified: Secondary | ICD-10-CM | POA: Diagnosis not present

## 2021-03-07 DIAGNOSIS — Z780 Asymptomatic menopausal state: Secondary | ICD-10-CM

## 2021-03-07 LAB — POCT URINALYSIS DIPSTICK
Bilirubin, UA: NEGATIVE
Blood, UA: NEGATIVE
Glucose, UA: POSITIVE — AB
Ketones, UA: NEGATIVE
Protein, UA: NEGATIVE
Spec Grav, UA: 1.015 (ref 1.010–1.025)
Urobilinogen, UA: NEGATIVE E.U./dL — AB
pH, UA: 6.5 (ref 5.0–8.0)

## 2021-03-07 LAB — POCT GLYCOSYLATED HEMOGLOBIN (HGB A1C): Hemoglobin A1C: 6.1 % — AB (ref 4.0–5.6)

## 2021-03-07 NOTE — Progress Notes (Signed)
Subjective:    Patient ID: Anita Mccormick, female    DOB: 1946/01/25, 75 y.o.   MRN: [id number]  Chief Complaint  Patient presents with   odor    Urinary odor, states sugar has been up    HPI Patient was seen today for acute concern for UTI 2/2 malodorous urine times a while.  Inquires about kidney health and liver 2/2 history of fatty liver disease..  Notes BP at home prior to breakfast 202, 249 per memory.  Patient taking Trulicity 1.5 mg weekly, glipizide XL 10 mg daily.  Pt no longer having LE edema.  Does endorse DOE.  Denies difficulty laying flat at night.  Past Medical History:  Diagnosis Date   Cancer [facility])    history of no adjunctive RX( no chemo no RT)   Cerebrovascular accident ([place])    [place], HX OF 12/10/2006   Qualifier: Diagnosis of  By: Leanne Chang MD, [place]     Diabetes mellitus    Type 2   Diabetes mellitus type II, controlled ([place]) 12/10/2006   Poor control on Januvia '100mg'$  and glipizide '10mg'$  XL Lab Results  Component Value Date   HGBA1C 8.3* 11/02/2014        Eczema    Fatty liver disease, nonalcoholic 123456   Per Dr. Leanne Chang Lab Results  Component Value Date   ALT 31 11/02/2014   AST 57* 11/02/2014   ALKPHOS 104 11/02/2014   BILITOT 1.1 11/02/2014       Hypertension     Allergies  Allergen Reactions   Ace Inhibitors Cough    ????   Chlorthalidone     REACTION: unspecified   Metformin     REACTION: gi side effects   Penicillins     REACTION: urticaria (hives)   Sulfamethoxazole     REACTION: questionable    ROS General: Denies fever, chills, night sweats, changes in weight, changes in appetite HEENT: Denies headaches, ear pain, changes in vision, rhinorrhea, sore throat CV: Denies CP, palpitations, SOB, orthopnea + DOE Pulm: Denies SOB, cough, wheezing GI: Denies abdominal pain, nausea, vomiting, diarrhea, constipation GU: Denies dysuria, hematuria, frequency, vaginal discharge  + malodorous urine Msk: Denies muscle cramps, joint pains Neuro:  Denies weakness, numbness, tingling Skin: Denies rashes, bruising Psych: Denies depression, anxiety, hallucinations     Objective:    Blood pressure 138/60, pulse 83, temperature 98.7 F (37.1 C), temperature source Oral, weight 151 lb 6.4 oz (68.7 kg), SpO2 99 %.  Gen. Pleasant, well-nourished, in no distress, normal affect   HEENT: [place]/AT, face symmetric, conjunctiva clear, no scleral icterus, PERRLA, EOMI, nares patent without drainage Lungs: no accessory muscle use, CTAB, no wheezes or rales Cardiovascular: RRR, no m/r/g, no peripheral edema Abdomen: BS present, soft, NT/ND Musculoskeletal: No deformities, no cyanosis or clubbing, normal tone Neuro:  A&Ox3, CN II-XII intact, normal gait Skin:  Warm, no lesions/ rash  Wt Readings from Last 3 Encounters:  03/07/21 151 lb 6.4 oz (68.7 kg)  08/23/20 149 lb 12.8 oz (67.9 kg)  06/18/20 150 lb 12.8 oz (68.4 kg)    Lab Results  Component Value Date   WBC 5.4 05/10/2020   HGB 11.7 05/10/2020   HCT 35.3 05/10/2020   PLT 107 (L) 05/10/2020   GLUCOSE 351 (H) 11/16/2017   CHOL 146 03/18/2017   TRIG 154.0 (H) 03/18/2017   HDL 49.80 03/18/2017   LDLDIRECT 95.8 07/02/2006   LDLCALC 65 03/18/2017   ALT 25 09/19/2017   AST 68 (H) 09/19/2017  NA 135 11/16/2017   K 4.0 11/16/2017   CL 101 11/16/2017   CREATININE 0.98 11/16/2017   BUN 11 11/16/2017   CO2 23 11/16/2017   TSH 1.32 04/11/2014   INR 1.0 10/18/2007   HGBA1C 6.1 (A) 03/07/2021   MICROALBUR <0.7 06/08/2019    Assessment/Plan:  Type 2 diabetes mellitus with other diabetic kidney complication, without long-term current use of insulin (HCC) -Hemoglobin A1c 6.5% on 05/10/2020 -Continue glipizide XL 10 mg daily, Trulicity 1.5 mg weekly -Continue lifestyle modifications -Hemoglobin A1c this visit 6.1% - Plan: POCT glycosylated hemoglobin (Hb A1C), CMP  Malodorous urine -Increase p.o. intake of fluids and water -Discussed the importance of glycemic control - Plan:  POCT urinalysis dipstick, CBC with Differential/Platelet, Urine Culture, Urine Culture  Postmenopause  - Plan: DG Bone Density  Fatty liver -Continue to avoid EtOH intake and decrease intake of saturated fats - Plan: CMP  DOE (dyspnea on exertion)  -Discussed possible causes including deconditioning, acute on chronic diastolic CHF exacerbation, infection -Obtain labs -Continue Lasix 20 mg -Continue to worsen symptoms obtain CXR - Plan: Brain Natriuretic Peptide  Essential hypertension -Elevated -Lifestyle modifications -Continue current medications including atenolol 100 mg twice daily, Lasix 20 mg  F/u as needed in the next few weeks  [name], MD

## 2021-03-07 NOTE — Patient Instructions (Signed)
Will wait on urine culture results before sending in antibiotic.

## 2021-03-08 ENCOUNTER — Telehealth: Payer: Self-pay

## 2021-03-08 ENCOUNTER — Other Ambulatory Visit: Payer: Self-pay | Admitting: Family Medicine

## 2021-03-08 DIAGNOSIS — I5033 Acute on chronic diastolic (congestive) heart failure: Secondary | ICD-10-CM

## 2021-03-08 DIAGNOSIS — D509 Iron deficiency anemia, unspecified: Secondary | ICD-10-CM

## 2021-03-08 LAB — CBC WITH DIFFERENTIAL/PLATELET
Basophils Absolute: 0.1 10*3/uL (ref 0.0–0.1)
Basophils Relative: 0.9 % (ref 0.0–3.0)
Eosinophils Absolute: 1.6 10*3/uL — ABNORMAL HIGH (ref 0.0–0.7)
Eosinophils Relative: 26.9 % — ABNORMAL HIGH (ref 0.0–5.0)
HCT: 25.9 % — ABNORMAL LOW (ref 36.0–46.0)
Hemoglobin: 8 g/dL — CL (ref 12.0–15.0)
Lymphocytes Relative: 15.7 % (ref 12.0–46.0)
Lymphs Abs: 1 10*3/uL (ref 0.7–4.0)
MCHC: 30.9 g/dL (ref 30.0–36.0)
MCV: 84.7 fl (ref 78.0–100.0)
Monocytes Absolute: 0.7 10*3/uL (ref 0.1–1.0)
Monocytes Relative: 11.8 % (ref 3.0–12.0)
Neutro Abs: 2.7 10*3/uL (ref 1.4–7.7)
Neutrophils Relative %: 44.7 % (ref 43.0–77.0)
Platelets: 117 10*3/uL — ABNORMAL LOW (ref 150.0–400.0)
RBC: 3.05 Mil/uL — ABNORMAL LOW (ref 3.87–5.11)
RDW: 17.2 % — ABNORMAL HIGH (ref 11.5–15.5)
WBC: 6.1 10*3/uL (ref 4.0–10.5)

## 2021-03-08 LAB — COMPREHENSIVE METABOLIC PANEL
ALT: 12 U/L (ref 0–35)
AST: 22 U/L (ref 0–37)
Albumin: 3.6 g/dL (ref 3.5–5.2)
Alkaline Phosphatase: 114 U/L (ref 39–117)
BUN: 15 mg/dL (ref 6–23)
CO2: 27 mEq/L (ref 19–32)
Calcium: 9.7 mg/dL (ref 8.4–10.5)
Chloride: 106 mEq/L (ref 96–112)
Creatinine, Ser: 1.09 mg/dL (ref 0.40–1.20)
GFR: 50.1 mL/min — ABNORMAL LOW (ref >60.00)
Glucose, Bld: 108 mg/dL — ABNORMAL HIGH (ref 70–99)
Potassium: 4.1 mEq/L (ref 3.5–5.1)
Sodium: 139 mEq/L (ref 135–145)
Total Bilirubin: 0.7 mg/dL (ref 0.2–1.2)
Total Protein: 6.8 g/dL (ref 6.0–8.3)

## 2021-03-08 LAB — BRAIN NATRIURETIC PEPTIDE: Pro B Natriuretic peptide (BNP): 229 pg/mL — ABNORMAL HIGH (ref 0.0–100.0)

## 2021-03-08 MED ORDER — FUROSEMIDE 20 MG PO TABS: 20.0000 mg | ORAL_TABLET | Freq: Every day | ORAL | 0 refills | Status: DC

## 2021-03-08 MED ORDER — POTASSIUM CHLORIDE CRYS ER 20 MEQ PO TBCR: 20.0000 meq | EXTENDED_RELEASE_TABLET | Freq: Every day | ORAL | 0 refills | Status: DC

## 2021-03-08 NOTE — Telephone Encounter (Signed)
CRITICAL VALUE STICKER  CRITICAL VALUE: Hemo 8.0  RECEIVER (on-site recipient of call): Shantoya Geurts  DATE & TIME NOTIFIED:  11:26 am  MESSENGER (representative from lab): [place]  MD NOTIFIED: Dr Volanda Napoleon  TIME OF NOTIFICATION: 11:28  RESPONSE:

## 2021-03-09 LAB — URINE CULTURE
MICRO NUMBER:: 12230910
SPECIMEN QUALITY:: ADEQUATE

## 2021-03-11 ENCOUNTER — Other Ambulatory Visit: Payer: Self-pay

## 2021-03-11 ENCOUNTER — Ambulatory Visit (INDEPENDENT_AMBULATORY_CARE_PROVIDER_SITE_OTHER)
Admission: RE | Admit: 2021-03-11 | Discharge: 2021-03-11 | Disposition: A | Discharge status: Home / Self Care | Payer: Medicare HMO | Source: Ambulatory Visit | Attending: Family Medicine | Admitting: Family Medicine

## 2021-03-11 ENCOUNTER — Other Ambulatory Visit: Payer: Medicare HMO

## 2021-03-11 DIAGNOSIS — R0600 Dyspnea, unspecified: Secondary | ICD-10-CM | POA: Diagnosis not present

## 2021-03-11 DIAGNOSIS — Z780 Asymptomatic menopausal state: Secondary | ICD-10-CM

## 2021-03-11 DIAGNOSIS — I5033 Acute on chronic diastolic (congestive) heart failure: Secondary | ICD-10-CM | POA: Diagnosis not present

## 2021-03-12 ENCOUNTER — Ambulatory Visit: Payer: Medicare HMO

## 2021-03-12 ENCOUNTER — Other Ambulatory Visit: Payer: Self-pay | Admitting: Family Medicine

## 2021-03-12 DIAGNOSIS — N3000 Acute cystitis without hematuria: Secondary | ICD-10-CM

## 2021-03-12 MED ORDER — CIPROFLOXACIN HCL 500 MG PO TABS: 500.0000 mg | ORAL_TABLET | Freq: Two times a day (BID) | ORAL | 0 refills | Status: AC

## 2021-03-18 ENCOUNTER — Encounter: Payer: Self-pay | Admitting: Family Medicine

## 2021-03-20 NOTE — Patient Instructions (Addendum)
Hi Anita Mccormick,   It was great to get to meet you over the telephone! Below is a summary of some of the topics we discussed.   Please reach out to me if you have any questions or need anything before our follow up!  Best, Anita Mccormick  Anita Mccormick, PharmD, [place] [facility] [place]   Visit Information   Goals Addressed   None    Patient Care Plan: CCM Pharmacy Care Plan     Problem Identified: Problem: Hypertension, Hyperlipidemia, Diabetes, Heart Failure, GERD, and Chronic Kidney Disease      Long-Range Goal: Patient-Specific Goal   Start Date: 02/27/2021  Expected End Date: 02/27/2022  This Visit's Progress: On track  Priority: High  Note:   Current Barriers:  Unable to independently monitor therapeutic efficacy  Pharmacist Clinical Goal(s):  Patient will achieve adherence to monitoring guidelines and medication adherence to achieve therapeutic efficacy through collaboration with PharmD and provider.   Interventions: 1:1 collaboration with Anita Ruddy, MD regarding development and update of comprehensive plan of care as evidenced by provider attestation and co-signature Inter-disciplinary care team collaboration (see longitudinal plan of care) Comprehensive medication review performed; medication list updated in electronic medical record  Hypertension (BP goal <140/90) -Controlled -Current treatment: Atenolol 100 mg 1 tablet twice daily Furosemide 20 mg as needed -Medications previously tried: chlorthalidone, ACE inhibitor  -Current home readings: 112/64 -Current dietary habits: limits salt intake -Current exercise habits: no structured exercise -Denies hypotensive/hypertensive symptoms -Educated on BP goals and benefits of medications for prevention of heart attack, stroke and kidney damage; Importance of home blood pressure monitoring; Proper BP monitoring technique; -Counseled to monitor BP at home weekly, document, and  provide log at future appointments -Counseled on diet and exercise extensively Recommended to continue current medication  Hyperlipidemia: (LDL goal < 70) -Controlled -Current treatment: No medications -Medications previously tried: statins (elevated LFTs)  -Current dietary patterns: did not discuss -Current exercise habits: no formal exercise -Educated on Cholesterol goals;  Exercise goal of 150 minutes per week; -Counseled on diet and exercise extensively Recommended repeat lipid panel  Diabetes (A1c goal <7%) -Controlled -Current medications: Trulicity 1.5 inject 1.5 mg once a week Glipizide XL 10 mg 1 tablet twice a daily  -Medications previously tried: metformin (diarrhea)  -Current home glucose readings fasting glucose: 200; 160 or lower; 124, 130  post prandial glucose: not much -Denies hypoglycemic/hyperglycemic symptoms -Current meal patterns:  breakfast: grapefruits with oatmeal, dried raisins and cranberries ([employer]), sausage lunch: salad, chicken salad with crackers  dinner: pasta and shrimp or salmon with vegetable snacks: ritz crackers or potato chips; corn chips; donut; sugarless drinks drinks: water; diet soda -Current exercise: no structured exercise -Educated on A1c and blood sugar goals; Complications of diabetes including kidney damage, retinal damage, and cardiovascular disease; Exercise goal of 150 minutes per week; Carbohydrate counting and/or plate method -Counseled to check feet daily and get yearly eye exams -Counseled on diet and exercise extensively Recommended to continue current medication  Heart Failure (Goal: manage symptoms and prevent exacerbations) -Not ideally controlled -Last ejection fraction: 60-65% (Date: 08/11/2017) -HF type: Diastolic -NYHA Class: II (slight limitation of activity) -AHA HF Stage: B (Heart disease present - no symptoms present) -Current treatment: Furosemide 20 mg 1 tablet every other day as needed Potassium  chloride 20 mEq tablet daily -Medications previously tried: none  -Current home BP/HR readings: refer to above -Current dietary habits: limits salt intake -Current exercise habits: minimal -Educated on Importance of weighing daily;  if you gain more than 3 pounds in one day or 5 pounds in one week, call PCP Proper diuretic administration and potassium supplementation -Counseled on diet and exercise extensively Recommended to continue current medication  GERD (Goal: minimize symptoms) -Controlled -Current treatment  Omeprazole 20 mg daily -Medications previously tried: none  - Reassess the need for medication at follow up.  Neuropathy (Goal: minimize nerve pain/tingling) -Controlled -Current treatment  Gabapentin 100 mg daily -Medications previously tried: none  -Recommended to continue current medication  History of CVA (Goal: prevent future strokes) -Not ideally controlled -Current treatment  Aspirin 325 mg 1 tablet daily  -Medications previously tried: none  -Recommended decreasing to aspirin 81 mg daily.   Health Maintenance -Vaccine gaps: shingrix -Current therapy:  None -Educated on Cost vs benefit of each product must be carefully weighed by individual consumer -Patient is satisfied with current therapy and denies issues -Recommended to continue as is  Patient Goals/Self-Care Activities Patient will:  - take medications as prescribed check glucose daily, document, and provide at future appointments check blood pressure weekly, document, and provide at future appointments target a minimum of 150 minutes of moderate intensity exercise weekly  Follow Up Plan: Telephone follow up appointment with care management team member scheduled for: 4 months      Anita Mccormick was given information about Chronic Care Management services today including:  CCM service includes personalized support from designated clinical staff supervised by her physician, including  individualized plan of care and coordination with other care providers 24/7 contact phone numbers for assistance for urgent and routine care needs. Standard insurance, coinsurance, copays and deductibles apply for chronic care management only during months in which we provide at least 20 minutes of these services. Most insurances cover these services at 100%, however patients may be responsible for any copay, coinsurance and/or deductible if applicable. This service may help you avoid the need for more expensive face-to-face services. Only one practitioner may furnish and bill the service in a calendar month. The patient may stop CCM services at any time (effective at the end of the month) by phone call to the office staff.  Patient agreed to services and verbal consent obtained.   The patient verbalized understanding of instructions, educational materials, and care plan provided today and agreed to receive a mailed copy of patient instructions, educational materials, and care plan.  Telephone follow up appointment with pharmacy team member scheduled for:  Anita Mccormick, [facility]

## 2021-04-10 ENCOUNTER — Telehealth: Payer: Self-pay | Admitting: Family Medicine

## 2021-04-10 NOTE — Telephone Encounter (Signed)
Tried calling patient to schedule Medicare Annual Wellness Visit (AWV) either virtually or in office. Left  my jabber number [phone]  No answer    Last AWV ;06/30/14  please schedule at anytime with [facility] Nurse Health Advisor 1 or 2   This should be a 45 minute visit.

## 2021-04-15 ENCOUNTER — Telehealth: Payer: Self-pay | Admitting: *Deleted

## 2021-04-15 NOTE — Telephone Encounter (Signed)
Pt cancelled previous AWV not interested in rescheduling it.

## 2021-04-18 DIAGNOSIS — K006 Disturbances in tooth eruption: Secondary | ICD-10-CM | POA: Diagnosis not present

## 2021-04-29 ENCOUNTER — Ambulatory Visit: Payer: Medicare HMO | Admitting: Podiatry

## 2021-05-11 ENCOUNTER — Other Ambulatory Visit: Payer: Self-pay | Admitting: Family Medicine

## 2021-05-13 ENCOUNTER — Other Ambulatory Visit: Payer: Self-pay

## 2021-05-13 ENCOUNTER — Ambulatory Visit: Payer: Medicare HMO | Admitting: Podiatry

## 2021-05-13 DIAGNOSIS — E119 Type 2 diabetes mellitus without complications: Secondary | ICD-10-CM

## 2021-05-13 DIAGNOSIS — Q828 Other specified congenital malformations of skin: Secondary | ICD-10-CM | POA: Diagnosis not present

## 2021-05-13 DIAGNOSIS — B351 Tinea unguium: Secondary | ICD-10-CM | POA: Diagnosis not present

## 2021-05-13 DIAGNOSIS — M79674 Pain in right toe(s): Secondary | ICD-10-CM | POA: Diagnosis not present

## 2021-05-13 DIAGNOSIS — M79675 Pain in left toe(s): Secondary | ICD-10-CM | POA: Diagnosis not present

## 2021-05-15 ENCOUNTER — Telehealth: Payer: Self-pay | Admitting: Pharmacist

## 2021-05-15 NOTE — Chronic Care Management (AMB) (Signed)
Chronic Care Management Pharmacy Assistant   Name: Anita Mccormick  MRN: [id number] DOB: 04-Mar-1946  Reason for Encounter: Disease State/ Diabetes Assessment Call.   Conditions to be addressed/monitored: DMII   Recent office visits:  03/07/21 Anita Mitts MD (Family Medicine) - seen for type 2 diabetes mellitus with other diabetic kidney complication and other issues. No medication changes. Follow up in 1 month.   Recent consult visits:  05/13/21 Anita Mccormick DPM (Podiatry) - seen for Callouses. No medication changes. Follow up in 3 months.   Hospital visits:  None in previous 6 months  Medications: Outpatient Encounter Medications as of 05/15/2021  Medication Sig   aspirin 325 MG tablet Take 325 mg by mouth at bedtime. (Patient not taking: No sig reported)   atenolol (TENORMIN) 100 MG tablet TAKE 1 TABLET BY MOUTH TWICE A DAY   Blood Glucose Monitoring Suppl (ONETOUCH ULTRALINK) w/Device KIT Test twice daily (onetouch ultra)   Blood Pressure Monitoring (BLOOD PRESSURE CUFF) MISC Use daily to monitor blood pressue   Calcium Carbonate-Vitamin D (CALCIUM PLUS VITAMIN D) 600-100 MG-UNIT CAPS Take 1 capsule by mouth 2 (two) times daily.   furosemide (LASIX) 20 MG tablet Take 1 tablet (20 mg total) by mouth daily for 3 days.   glipiZIDE (GLUCOTROL XL) 10 MG 24 hr tablet TAKE 1 TABLET BY MOUTH TWICE A DAY   Lancets (ONETOUCH ULTRASOFT) lancets Use as instructed   ONETOUCH ULTRA test strip USE AS INSTRUCTED. FOR 3 TIMES A DAY TESTING, MORE FOR HYPO OR HYPERGLYCEMIA.   potassium chloride SA (KLOR-CON) 20 MEQ tablet Take 1 tablet (20 mEq total) by mouth daily.   TRULICITY 1.5 GE/3.6OQ SOPN INJECT 1.5 MG INTO THE SKIN ONCE A WEEK.   No facility-administered encounter medications on file as of 05/15/2021.   Fill History: atenolol (TENORMIN) tablet 94/76/5465 90   TRULICITY 1.5 KP/5.4SF [place] SOPN 04/04/2021 28   KLOR-CON M20 20 MEQ PO TBCR 03/08/2021 3   glipiZIDE (GLUCOTROL XL)  24 hr tablet 03/07/2021 90   Recent Relevant Labs: Lab Results  Component Value Date/Time   HGBA1C 6.1 (A) 03/07/2021 03:18 PM   HGBA1C 6.5 (H) 05/10/2020 02:02 PM   HGBA1C 6.0 (A) 12/01/2019 03:54 PM   HGBA1C 6.8 (H) 03/18/2017 08:51 AM   MICROALBUR <0.7 06/08/2019 12:55 PM   MICROALBUR 0.2 04/11/2014 11:36 AM    Kidney Function Lab Results  Component Value Date/Time   CREATININE 1.09 03/07/2021 03:50 PM   CREATININE 0.98 11/16/2017 06:00 PM   CREATININE 0.91 08/07/2017 03:45 PM   CREATININE 0.85 07/11/2016 03:33 PM   GFR 50.10 (L) 03/07/2021 03:50 PM   GFRNONAA 57 (L) 11/16/2017 06:00 PM   GFRAA >60 11/16/2017 06:00 PM    Current antihyperglycemic regimen:  Glipizide 49m - take 1 tablet by mouth once daily.  Trulicity 16.8LE- inject 1.59monce a week.  What recent interventions/DTPs have been made to improve glycemic control:  None. Have there been any recent hospitalizations or ED visits since last visit with CPP? No Patient reports hypoglycemic symptoms, including Sweaty Patient reports hyperglycemic symptoms, including excessive thirst, polyuria, and weakness How often are you checking your blood sugar? Patient checks as she feels bad.  What are your blood sugars ranging?  Fasting: 125 before eating on 05/16/21 and 134 after eating. She has not checked a whole lot lately due to her finger tips being sore. During the week, how often does your blood glucose drop below 70? Never Are you checking  your feet daily/regularly? Yes. She does have a sore and her feet are a little tender to the touch. She sees the podiatrist. She has burning and numbness with her calluses on her feet.   Adherence Review: Is the patient currently on a STATIN medication? No Is the patient currently on ACE/ARB medication? No Does the patient have >5 day gap between last estimated fill dates? No  Notes: Spoke with patient and reviewed all medications as prescribed. Patient requested to apply for  patient assistance for her Trulicity. She stated that 3 of her grandchildren and two of her grandchildren live with her and it has been rough to afford the medication. I completed application and gave detailed instructions for patient to complete once to arrives in the mail to her. Patient stated she gets swelling in her feet and extreme shortness of breath when she gets up and moves around. She stated [facility] calling [facility] her lasix and potassium and that she hasn't taken either lately. I advised her to start taking as prescribed. Patient states she eats basically what she wants and is available. Application was mailed to patient today on 05/17/21. Patient thanked me for my call. Will follow up on PAP in 2 weeks.   Care Gaps:  AWV - canceled 03/12/21, to be rescheduled.  Zoster vaccines - never done Ophthalmology exam - overdue since 2019 Foot exam - overdue since 2020 Flu vaccine - due  Star Rating Drugs:  Glipizide 61m - [facility] 13.1YH- [facility] [place] [facility] [place] [place] ([phone]

## 2021-05-20 NOTE — Progress Notes (Signed)
Subjective: 75 y.o. returns the office today for painful, elongated, thickened toenails which she cannot trim herself.   Denies any open lesions but the calluses are being painful.  No swelling, redness, drainage she has noticed.   PCP: Billie Ruddy, MD Last seen 03/07/2021  Objective: AAO 3, NAD DP/PT pulses palpable, CRT less than 3 seconds Nails hypertrophic, dystrophic, elongated, brittle, discolored 10. There is tenderness overlying the nails 1-5 bilaterally. There is no surrounding erythema or drainage along the nail sites. Hyperkeratotic lesion plantar right foot  medial hallux and right medial heel without any underlying ulceration, drainage or signs of infection. No significant hyperkeratotic tissue to the digits.  Hammertoes evident. No pain with calf compression, swelling, warmth, erythema.  Assessment: Patient presents with symptomatic onychomycosis; hyperkeratotic lesion  Plan: -Treatment options including alternatives, risks, complications were discussed -Nails sharply debrided 10 without complication/bleeding. -Hyperkeratotic lesion sharply debrided x2 without complications or bleeding as a courtesy. Recommended to continue with moisturizer daily.  -Discussed daily foot inspection. If there are any changes, to call the office immediately.  -Follow-up as scheduled or sooner if any problems are to arise. In the meantime, encouraged to call the office with any questions, concerns, changes symptoms.  Celesta Gentile, DPM

## 2021-06-03 ENCOUNTER — Telehealth: Payer: Self-pay | Admitting: Pharmacist

## 2021-06-03 NOTE — Chronic Care Management (AMB) (Signed)
Chronic Care Management Pharmacy Assistant   Name: Teckla A Llamas  MRN: [id number] DOB: 05/21/1946  Spoke with elizabeth at [facility] to inquire the status of patients trulicity application. Nothing has been received and there is no record of patient.   Spoke with patient and followed up on application. Patient stated she completed her part and her husband just mailed it into her PCP office [facility] on this past Saturday 06/01/21. I sent a message to Madeline Pryor to keep an eye out on office mail.  Patient thanked me for my call.   Medications: Outpatient Encounter Medications as of 06/03/2021  Medication Sig   aspirin 325 MG tablet Take 325 mg by mouth at bedtime. (Patient not taking: No sig reported)   atenolol (TENORMIN) 100 MG tablet TAKE 1 TABLET BY MOUTH TWICE A DAY   Blood Glucose Monitoring Suppl (ONETOUCH ULTRALINK) w/Device KIT Test twice daily (onetouch ultra)   Blood Pressure Monitoring (BLOOD PRESSURE CUFF) MISC Use daily to monitor blood pressue   Calcium Carbonate-Vitamin D (CALCIUM PLUS VITAMIN D) 600-100 MG-UNIT CAPS Take 1 capsule by mouth 2 (two) times daily.   furosemide (LASIX) 20 MG tablet Take 1 tablet (20 mg total) by mouth daily for 3 days.   glipiZIDE (GLUCOTROL XL) 10 MG 24 hr tablet TAKE 1 TABLET BY MOUTH TWICE A DAY   Lancets (ONETOUCH ULTRASOFT) lancets Use as instructed   ONETOUCH ULTRA test strip USE AS INSTRUCTED. FOR 3 TIMES A DAY TESTING, MORE FOR HYPO OR HYPERGLYCEMIA.   potassium chloride SA (KLOR-CON) 20 MEQ tablet Take 1 tablet (20 mEq total) by mouth daily.   TRULICITY 1.5 MG/0.5ML SOPN INJECT 1.5 MG INTO THE SKIN ONCE A WEEK.   No facility-administered encounter medications on file as of 06/03/2021.    Care Gaps:  AWV - canceled 03/12/21, to be rescheduled.  Zoster vaccines - never done Ophthalmology exam - overdue since 2019 Foot exam - overdue since 2020 Flu vaccine - due  Star Rating Drugs:  Glipizide 10mg  - [facility] Trulicity 1.5mg - [facility]  Chelsie Lowe CMA  Clinical Pharmacist Assistant [phone]  

## 2021-06-05 ENCOUNTER — Ambulatory Visit (INDEPENDENT_AMBULATORY_CARE_PROVIDER_SITE_OTHER): Payer: Medicare HMO | Admitting: Family Medicine

## 2021-06-05 ENCOUNTER — Other Ambulatory Visit: Payer: Self-pay

## 2021-06-05 ENCOUNTER — Encounter: Payer: Self-pay | Admitting: Family Medicine

## 2021-06-05 ENCOUNTER — Telehealth: Payer: Self-pay

## 2021-06-05 VITALS — BP 142/66 | HR 87 | Temp 98.6°F | Wt 151.6 lb

## 2021-06-05 DIAGNOSIS — R5383 Other fatigue: Secondary | ICD-10-CM

## 2021-06-05 DIAGNOSIS — Z862 Personal history of diseases of the blood and blood-forming organs and certain disorders involving the immune mechanism: Secondary | ICD-10-CM | POA: Diagnosis not present

## 2021-06-05 DIAGNOSIS — R0602 Shortness of breath: Secondary | ICD-10-CM

## 2021-06-05 DIAGNOSIS — R739 Hyperglycemia, unspecified: Secondary | ICD-10-CM

## 2021-06-05 DIAGNOSIS — E1129 Type 2 diabetes mellitus with other diabetic kidney complication: Secondary | ICD-10-CM | POA: Diagnosis not present

## 2021-06-05 DIAGNOSIS — R600 Localized edema: Secondary | ICD-10-CM | POA: Diagnosis not present

## 2021-06-05 DIAGNOSIS — I5032 Chronic diastolic (congestive) heart failure: Secondary | ICD-10-CM

## 2021-06-05 LAB — CBC WITH DIFFERENTIAL/PLATELET
Basophils Absolute: 0.1 10*3/uL (ref 0.0–0.1)
Basophils Relative: 1.5 % (ref 0.0–3.0)
Eosinophils Absolute: 1.3 10*3/uL — ABNORMAL HIGH (ref 0.0–0.7)
Eosinophils Relative: 24 % — ABNORMAL HIGH (ref 0.0–5.0)
HCT: 23.5 % — CL (ref 36.0–46.0)
Hemoglobin: 7.1 g/dL — CL (ref 12.0–15.0)
Lymphocytes Relative: 13.4 % (ref 12.0–46.0)
Lymphs Abs: 0.7 10*3/uL (ref 0.7–4.0)
MCHC: 30.2 g/dL (ref 30.0–36.0)
MCV: 81.3 fl (ref 78.0–100.0)
Monocytes Absolute: 0.5 10*3/uL (ref 0.1–1.0)
Monocytes Relative: 9.3 % (ref 3.0–12.0)
Neutro Abs: 2.8 10*3/uL (ref 1.4–7.7)
Neutrophils Relative %: 51.8 % (ref 43.0–77.0)
Platelets: 96 10*3/uL — ABNORMAL LOW (ref 150.0–400.0)
RBC: 2.89 Mil/uL — ABNORMAL LOW (ref 3.87–5.11)
RDW: 19 % — ABNORMAL HIGH (ref 11.5–15.5)
WBC: 5.5 10*3/uL (ref 4.0–10.5)

## 2021-06-05 LAB — COMPREHENSIVE METABOLIC PANEL
ALT: 12 U/L (ref 0–35)
AST: 22 U/L (ref 0–37)
Albumin: 3.7 g/dL (ref 3.5–5.2)
Alkaline Phosphatase: 88 U/L (ref 39–117)
BUN: 18 mg/dL (ref 6–23)
CO2: 27 mEq/L (ref 19–32)
Calcium: 9.5 mg/dL (ref 8.4–10.5)
Chloride: 104 mEq/L (ref 96–112)
Creatinine, Ser: 1.23 mg/dL — ABNORMAL HIGH (ref 0.40–1.20)
GFR: 43.26 mL/min — ABNORMAL LOW (ref >60.00)
Glucose, Bld: 219 mg/dL — ABNORMAL HIGH (ref 70–99)
Potassium: 4.6 mEq/L (ref 3.5–5.1)
Sodium: 137 mEq/L (ref 135–145)
Total Bilirubin: 0.9 mg/dL (ref 0.2–1.2)
Total Protein: 7 g/dL (ref 6.0–8.3)

## 2021-06-05 LAB — HEMOGLOBIN A1C: Hgb A1c MFr Bld: 6.6 % — ABNORMAL HIGH (ref 4.6–6.5)

## 2021-06-05 LAB — BRAIN NATRIURETIC PEPTIDE: Pro B Natriuretic peptide (BNP): 683 pg/mL — ABNORMAL HIGH (ref 0.0–100.0)

## 2021-06-05 LAB — GLUCOSE, POCT (MANUAL RESULT ENTRY): POC Glucose: 330 mg/dl — AB (ref 70–99)

## 2021-06-05 LAB — VITAMIN D 25 HYDROXY (VIT D DEFICIENCY, FRACTURES): VITD: 86.43 ng/mL (ref 30.00–100.00)

## 2021-06-05 LAB — TSH: TSH: 0.76 u[IU]/mL (ref 0.35–5.50)

## 2021-06-05 MED ORDER — BLOOD GLUCOSE MONITOR KIT: PACK | 0 refills | Status: DC

## 2021-06-05 MED ORDER — FREESTYLE LIBRE 14 DAY SENSOR MISC: 1.0000 | 5 refills | Status: DC

## 2021-06-05 MED ORDER — FREESTYLE LIBRE READER DEVI: 1.0000 | Freq: Four times a day (QID) | 0 refills | Status: DC | PRN

## 2021-06-05 NOTE — Progress Notes (Signed)
Subjective:    Patient ID: Anita Mccormick, female    DOB: 05-28-1946, 75 y.o.   MRN: [id number]  Chief Complaint  Patient presents with   Shortness of Breath    Has been feeling SOB, some days a re good and some are bad, weakness in legs, feet hurt.     HPI Patient is a 75 yo female with pmh sig for DM II, HLD, nicotine dependence chronic diastolic CHF, cirrhossis of liver, h/o breast cancer, anemia, CKD.  Pt was seen today for ongoing concern.  Pt endorses general unwell feeling, SOB with exertion, and pedal edema x over a month.  Pt has not really been checking fsbs. Had lasix and potassium supplement at home, but it expired so she did not take it.  Pt also endorses urinary frequency and daily BMs.  Eating ice frequently.  Had COVID booster and influenza vaccine within the last few months at [facility].  Past Medical History:  Diagnosis Date   Cancer [facility])    history of no adjunctive RX( no chemo no RT)   Cerebrovascular accident ([place])    [place], HX OF 12/10/2006   Qualifier: Diagnosis of  By: Leanne Chang MD, [place]     Diabetes mellitus    Type 2   Diabetes mellitus type II, controlled ([place]) 12/10/2006   Poor control on Januvia 173m and glipizide 150mXL Lab Results  Component Value Date   HGBA1C 8.3* 11/02/2014        Eczema    Fatty liver disease, nonalcoholic 1225/00/3704 Per Dr. SwLeanne Changab Results  Component Value Date   ALT 31 11/02/2014   AST 57* 11/02/2014   ALKPHOS 104 11/02/2014   BILITOT 1.1 11/02/2014       Hypertension     Allergies  Allergen Reactions   Ace Inhibitors Cough    ????   Chlorthalidone     REACTION: unspecified   Metformin     REACTION: gi side effects   Penicillins     REACTION: urticaria (hives)   Sulfamethoxazole     REACTION: questionable    ROS General: Denies fever, chills, night sweats, changes in weight, changes in appetite +fatigue HEENT: Denies headaches, ear pain, changes in vision, rhinorrhea, sore throat CV: Denies CP,  palpitations, SOB, orthopnea + SOB, spider veins, pedal edema Pulm: Denies SOB, cough, wheezing + SOB GI: Denies abdominal pain, nausea, vomiting, diarrhea, constipation GU: Denies dysuria, hematuria, frequency, vaginal discharge +urinary frequency Msk: Denies muscle cramps, joint pains Neuro: Denies weakness, numbness, tingling Skin: Denies rashes, bruising Psych: Denies depression, anxiety, hallucinations  Objective:    Blood pressure (!) 142/66, pulse 87, temperature 98.6 F (37 C), temperature source Oral, weight 151 lb 9.6 oz (68.8 kg), SpO2 95 %.  Gen. Pleasant, well-nourished, in no distress, normal affect   HEENT: [place]/AT, face symmetric, conjunctiva clear, no scleral icterus, PERRLA, EOMI, nares patent without drainage Lungs: no accessory muscle use, CTAB, no wheezes or rales Cardiovascular: RRR, no m/r/g, trace edema in b/l shins Abdomen: BS present, soft, NT/ND, no hepatosplenomegaly. Musculoskeletal: No deformities, no cyanosis or clubbing, normal tone Neuro:  A&Ox3, CN II-XII intact, normal gait Skin:  Warm, no lesions/ rash   Wt Readings from Last 3 Encounters:  06/05/21 151 lb 9.6 oz (68.8 kg)  03/07/21 151 lb 6.4 oz (68.7 kg)  08/23/20 149 lb 12.8 oz (67.9 kg)    Lab Results  Component Value Date   WBC 6.1 03/07/2021   HGB 8.0 Repeated and  verified X2. (LL) 03/07/2021   HCT 25.9 (L) 03/07/2021   PLT 117.0 (L) 03/07/2021   GLUCOSE 108 (H) 03/07/2021   CHOL 146 03/18/2017   TRIG 154.0 (H) 03/18/2017   HDL 49.80 03/18/2017   LDLDIRECT 95.8 07/02/2006   LDLCALC 65 03/18/2017   ALT 12 03/07/2021   AST 22 03/07/2021   NA 139 03/07/2021   K 4.1 03/07/2021   CL 106 03/07/2021   CREATININE 1.09 03/07/2021   BUN 15 03/07/2021   CO2 27 03/07/2021   TSH 1.32 04/11/2014   INR 1.0 10/18/2007   HGBA1C 6.1 (A) 03/07/2021   MICROALBUR <0.7 06/08/2019    Assessment/Plan:  Type 2 diabetes mellitus with other diabetic kidney complication, without long-term current  use of insulin ([place])  -uncontrolled -Hgb A1C 6.1% on 03/07/21 -fsbs in clinic 330 this visit -discussed the need to check fsbs at home. -Continue Trulicity 1.5 mg/ wk and glipizide XL 10 mg.   -Discussed starting insulin.  Patient declines at this time. - Plan: POC Glucose (CBG), Hemoglobin A1c, blood glucose meter kit and supplies KIT, Continuous Blood Gluc Sensor (FREESTYLE LIBRE 14 DAY SENSOR) MISC, Continuous Blood Gluc Receiver (FREESTYLE LIBRE READER) DEVI  Fatigue, unspecified type  -possibly 2/2 hyperglycemia.  Also consider CHF. -We will obtain labs - Plan: POC Glucose (CBG), Brain Natriuretic Peptide, CBC with Differential/Platelet, CMP, Iron, TIBC and Ferritin Panel, TSH, Vitamin D, 25-hydroxy  SOB (shortness of breath) on exertion  -Concern for CHF exacerbation versus deconditioning - Plan: Brain Natriuretic Peptide, CBC with Differential/Platelet, Iron, TIBC and Ferritin Panel  Pedal edema  -Continue supportive care including elevation of lower extremities, compression socks or TED hose, decreasing sodium intake - Plan: Brain Natriuretic Peptide  Chronic diastolic CHF (congestive heart failure) ([place]) -Echo 08/11/2017 with grade 1 diastolic dysfunction, mild LAE dilation, EF 60-65%. - Plan: Brain Natriuretic Peptide  History of anemia - Plan: Iron, TIBC and Ferritin Panel  Hyperglycemia  - Plan: blood glucose meter kit and supplies KIT, Continuous Blood Gluc Sensor (FREESTYLE LIBRE 14 DAY SENSOR) MISC, Continuous Blood Gluc Receiver (FREESTYLE LIBRE READER) DEVI  F/u in the next 1-2 wks, sooner if needed  [name], MD

## 2021-06-05 NOTE — Telephone Encounter (Addendum)
CRITICAL VALUE STICKER  CRITICAL VALUE: Hemoglobin 7.1           HCT 23.5  RECEIVER (on-site recipient of call): Madaline Guthrie, [place] NOTIFIED: 3:30 pm   MESSENGER (representative from lab): Doren Custard  MD NOTIFIED: Dr Volanda Napoleon  TIME OF NOTIFICATION: 3:25 pm  RESPONSE:  refer pt to ED. Spoke with patient, stated she will go to ED first thing in the morning.

## 2021-06-06 ENCOUNTER — Encounter (HOSPITAL_BASED_OUTPATIENT_CLINIC_OR_DEPARTMENT_OTHER): Payer: Self-pay | Admitting: Obstetrics and Gynecology

## 2021-06-06 ENCOUNTER — Telehealth: Payer: Self-pay | Admitting: Family Medicine

## 2021-06-06 ENCOUNTER — Other Ambulatory Visit: Payer: Self-pay

## 2021-06-06 ENCOUNTER — Emergency Department (HOSPITAL_BASED_OUTPATIENT_CLINIC_OR_DEPARTMENT_OTHER): Payer: Medicare HMO | Admitting: Radiology

## 2021-06-06 ENCOUNTER — Observation Stay (HOSPITAL_BASED_OUTPATIENT_CLINIC_OR_DEPARTMENT_OTHER)
Admission: EM | Admit: 2021-06-06 | Discharge: 2021-06-07 | Disposition: A | Discharge status: Home / Self Care | Payer: Medicare HMO | Attending: Family Medicine | Admitting: Family Medicine

## 2021-06-06 DIAGNOSIS — R7989 Other specified abnormal findings of blood chemistry: Secondary | ICD-10-CM | POA: Diagnosis not present

## 2021-06-06 DIAGNOSIS — I5032 Chronic diastolic (congestive) heart failure: Secondary | ICD-10-CM | POA: Insufficient documentation

## 2021-06-06 DIAGNOSIS — F17210 Nicotine dependence, cigarettes, uncomplicated: Secondary | ICD-10-CM | POA: Insufficient documentation

## 2021-06-06 DIAGNOSIS — Z7985 Long-term (current) use of injectable non-insulin antidiabetic drugs: Secondary | ICD-10-CM | POA: Insufficient documentation

## 2021-06-06 DIAGNOSIS — D649 Anemia, unspecified: Secondary | ICD-10-CM | POA: Diagnosis not present

## 2021-06-06 DIAGNOSIS — I110 Hypertensive heart disease with heart failure: Secondary | ICD-10-CM | POA: Diagnosis not present

## 2021-06-06 DIAGNOSIS — I5033 Acute on chronic diastolic (congestive) heart failure: Secondary | ICD-10-CM | POA: Diagnosis present

## 2021-06-06 DIAGNOSIS — Z7984 Long term (current) use of oral hypoglycemic drugs: Secondary | ICD-10-CM | POA: Insufficient documentation

## 2021-06-06 DIAGNOSIS — Z853 Personal history of malignant neoplasm of breast: Secondary | ICD-10-CM | POA: Diagnosis not present

## 2021-06-06 DIAGNOSIS — Z7982 Long term (current) use of aspirin: Secondary | ICD-10-CM | POA: Insufficient documentation

## 2021-06-06 DIAGNOSIS — Z20822 Contact with and (suspected) exposure to covid-19: Secondary | ICD-10-CM | POA: Diagnosis not present

## 2021-06-06 DIAGNOSIS — I10 Essential (primary) hypertension: Secondary | ICD-10-CM | POA: Diagnosis not present

## 2021-06-06 DIAGNOSIS — E1165 Type 2 diabetes mellitus with hyperglycemia: Secondary | ICD-10-CM | POA: Diagnosis not present

## 2021-06-06 DIAGNOSIS — Z79899 Other long term (current) drug therapy: Secondary | ICD-10-CM | POA: Insufficient documentation

## 2021-06-06 DIAGNOSIS — R0602 Shortness of breath: Secondary | ICD-10-CM | POA: Diagnosis not present

## 2021-06-06 HISTORY — DX: Anemia, unspecified: D64.9

## 2021-06-06 LAB — CBC WITH DIFFERENTIAL/PLATELET
Abs Immature Granulocytes: 0.02 10*3/uL (ref 0.00–0.07)
Basophils Absolute: 0.1 10*3/uL (ref 0.0–0.1)
Basophils Relative: 1 %
Eosinophils Absolute: 1.7 10*3/uL — ABNORMAL HIGH (ref 0.0–0.5)
Eosinophils Relative: 28 %
HCT: 26.6 % — ABNORMAL LOW (ref 36.0–46.0)
Hemoglobin: 7.4 g/dL — ABNORMAL LOW (ref 12.0–15.0)
Immature Granulocytes: 0 %
Lymphocytes Relative: 12 %
Lymphs Abs: 0.7 10*3/uL (ref 0.7–4.0)
MCH: 24 pg — ABNORMAL LOW (ref 26.0–34.0)
MCHC: 27.8 g/dL — ABNORMAL LOW (ref 30.0–36.0)
MCV: 86.4 fL (ref 80.0–100.0)
Monocytes Absolute: 0.5 10*3/uL (ref 0.1–1.0)
Monocytes Relative: 9 %
Neutro Abs: 3 10*3/uL (ref 1.7–7.7)
Neutrophils Relative %: 50 %
Platelets: 113 10*3/uL — ABNORMAL LOW (ref 150–400)
RBC: 3.08 MIL/uL — ABNORMAL LOW (ref 3.87–5.11)
RDW: 18.2 % — ABNORMAL HIGH (ref 11.5–15.5)
WBC: 5.9 10*3/uL (ref 4.0–10.5)
nRBC: 0 % (ref 0.0–0.2)

## 2021-06-06 LAB — COMPREHENSIVE METABOLIC PANEL
ALT: 10 U/L (ref 0–44)
AST: 19 U/L (ref 15–41)
Albumin: 3.7 g/dL (ref 3.5–5.0)
Alkaline Phosphatase: 92 U/L (ref 38–126)
Anion gap: 6 (ref 5–15)
BUN: 17 mg/dL (ref 8–23)
CO2: 26 mmol/L (ref 22–32)
Calcium: 9.5 mg/dL (ref 8.9–10.3)
Chloride: 104 mmol/L (ref 98–111)
Creatinine, Ser: 1.24 mg/dL — ABNORMAL HIGH (ref 0.44–1.00)
GFR, Estimated: 46 mL/min — ABNORMAL LOW (ref >60)
Glucose, Bld: 323 mg/dL — ABNORMAL HIGH (ref 70–99)
Potassium: 4.8 mmol/L (ref 3.5–5.1)
Sodium: 136 mmol/L (ref 135–145)
Total Bilirubin: 0.9 mg/dL (ref 0.3–1.2)
Total Protein: 7 g/dL (ref 6.5–8.1)

## 2021-06-06 LAB — RESP PANEL BY RT-PCR (FLU A&B, COVID) ARPGX2
Influenza A by PCR: NEGATIVE
Influenza B by PCR: NEGATIVE
SARS Coronavirus 2 by RT PCR: NEGATIVE

## 2021-06-06 LAB — GLUCOSE, CAPILLARY
Glucose-Capillary: 137 mg/dL — ABNORMAL HIGH (ref 70–99)
Glucose-Capillary: 168 mg/dL — ABNORMAL HIGH (ref 70–99)

## 2021-06-06 LAB — PROTIME-INR
INR: 1.2 (ref 0.8–1.2)
Prothrombin Time: 15.1 seconds (ref 11.4–15.2)

## 2021-06-06 LAB — IRON,TIBC AND FERRITIN PANEL
%SAT: 4 % (calc) — ABNORMAL LOW (ref 16–45)
Ferritin: 7 ng/mL — ABNORMAL LOW (ref 16–288)
Iron: 22 ug/dL — ABNORMAL LOW (ref 45–160)
TIBC: 521 mcg/dL (calc) — ABNORMAL HIGH (ref 250–450)

## 2021-06-06 LAB — PREPARE RBC (CROSSMATCH)

## 2021-06-06 LAB — CBG MONITORING, ED: Glucose-Capillary: 134 mg/dL — ABNORMAL HIGH (ref 70–99)

## 2021-06-06 LAB — OCCULT BLOOD X 1 CARD TO LAB, STOOL: Fecal Occult Bld: NEGATIVE

## 2021-06-06 MED ORDER — SODIUM CHLORIDE 0.9% IV SOLUTION: Freq: Once | INTRAVENOUS | Status: AC

## 2021-06-06 MED ORDER — DOCUSATE SODIUM 100 MG PO CAPS: 100.0000 mg | ORAL_CAPSULE | Freq: Every day | ORAL | Status: DC

## 2021-06-06 MED ORDER — INSULIN ASPART 100 UNIT/ML IJ SOLN
0.0000 [IU] | Freq: Three times a day (TID) | INTRAMUSCULAR | Status: DC
  Administered 2021-06-07: 2 [IU] via SUBCUTANEOUS
  Administered 2021-06-07: 7 [IU] via SUBCUTANEOUS

## 2021-06-06 MED ORDER — FERROUS SULFATE 325 (65 FE) MG PO TABS
325.0000 mg | ORAL_TABLET | Freq: Every day | ORAL | Status: DC
  Administered 2021-06-07: 325 mg via ORAL
  Filled 2021-06-06: qty 1

## 2021-06-06 MED ORDER — INSULIN ASPART 100 UNIT/ML IJ SOLN: 0.0000 [IU] | Freq: Every day | INTRAMUSCULAR | Status: DC

## 2021-06-06 NOTE — Telephone Encounter (Signed)
Kaitlin from [facility] called because patient is admitted for low hemoglobin but they do not do blood transfusions so patient will have to wait to be admitted elsewhere. Fara Chute would also like it noted that the [facility] in [place] does not do them either.     Good callback if need be [phone]     Please advise

## 2021-06-06 NOTE — Plan of Care (Signed)
  Problem: Activity: Goal: Risk for activity intolerance will decrease Outcome: Progressing   Problem: Nutrition: Goal: Adequate nutrition will be maintained Outcome: Progressing   

## 2021-06-06 NOTE — Progress Notes (Signed)
Received a phone call from Facility: [facility]  Requesting MD: Roslynn Amble Patient with h/o CVA; HTN; DM; and cancer presenting with Hgb 7.8 and symptomatic anemia.  Low Hgb with DOE, fatigue.  Hgb 7.4 today.  Anemia panel with iron deficiency anemia.  Heme negative.   VSS stable.     Plan of care: I called to se if she could get 1 unit PRBC and an IV iron infusion at the [facility] but Dr. Benay Spice reports that this is not possible and she will need admission.   The patient will be accepted for observation at [facility] when bed is available.    Nursing staff, Please call the [facility] number at the top of Amion at the time of the patient's arrival so that the patient can be paged to the admitting physician.   Carlyon Shadow, M.D. [facility]

## 2021-06-06 NOTE — Progress Notes (Signed)
NEW ADMISSION NOTE New Admission Note:   Arrival Method:  streatcher Mental Orientation:  A&o x4 Telemetry: no Assessment: Completed Skin: intact see assement IV: right Fore Arm Pain: zero Tubes: none Safety Measures: Safety Fall Prevention Plan has been given, discussed and signed Admission: Completed [facility] Orientation: Patient has been orientated to the room, unit and staff.  Family: none present  Orders have been reviewed and implemented. Will continue to monitor the patient. Call light has been placed within reach and bed alarm has been activated.   Berneta Levins, RN

## 2021-06-06 NOTE — H&P (Signed)
History and Physical  Anita Mccormick FWY:[id number] DOB: [birth year] DOA: 06/06/2021  Referring physician: Lucrezia Starch, MD  PCP: Billie Ruddy, MD  Patient coming from: Home  Chief Complaint: Abnormal labs  HPI: Anita Mccormick is a 75 y.o. female with medical history significant for type 2 diabetes mellitus, essential hypertension, history of left breast cancer status post mastectomy and diastolic CHF who presents to the [facility] emergency department due to abnormal blood work.  Patient complains of about 1 month history of shortness of breath on exertion, generalized weakness, so she went to to her PCP's office yesterday, blood work was done and showed hemoglobin of 7.1.  Iron studies done was consistent with iron deficiency anemia.  She was then asked to go to the emergency department.  TRH was consulted and patient was accepted for admission at [facility].  She denies fever, chills, chest pain, nausea or vomiting.  ED Course:  In the emergency department, patient was hemodynamically stable.  Work-up in the ED showed normocytic anemia, hyperglycemia, iron studies done on 11/9 was positive for iron deficiency anemia.  Influenza A, B, SARS coronavirus 2 was negative. Chest x-ray showed no active cardiopulmonary disease. Hospitalist was consulted for management of patient's symptomatic anemia.  Patient was transferred to [facility] from [facility] for further evaluation and management.  Review of Systems: Constitutional: Positive for fatigue.  Negative for chills and fever.  HENT: Negative for ear pain and sore throat.   Eyes: Negative for pain and visual disturbance.  Respiratory: Positive for shortness of breath.  Negative for cough, chest tightness  Cardiovascular: Negative for chest pain and palpitations.  Gastrointestinal: Negative for abdominal pain and vomiting.  Endocrine: Negative for polyphagia and polyuria.  Genitourinary: Negative for decreased urine volume, dysuria,  enuresis Musculoskeletal: Negative for arthralgias and back pain.  Skin: Negative for color change and rash.  Allergic/Immunologic: Negative for immunocompromised state.  Neurological: Negative for tremors, syncope, speech difficulty, weakness, light-headedness and headaches.  Hematological: Does not bruise/bleed easily.  All other systems reviewed and are negative   Past Medical History:  Diagnosis Date   Cancer ([place])    history of no adjunctive RX( no chemo no RT)   Cerebrovascular accident ([place])    [place], HX OF 12/10/2006   Qualifier: Diagnosis of  By: Leanne Chang MD, Bruce     Diabetes mellitus type II, controlled ([place]) 12/10/2006   Poor control on Januvia 176m and glipizide 166mXL Lab Results  Component Value Date   HGBA1C 8.3* 11/02/2014        Eczema    Fatty liver disease, nonalcoholic 1227/74/1287 Per Dr. SwLeanne Changab Results  Component Value Date   ALT 31 11/02/2014   AST 57* 11/02/2014   ALKPHOS 104 11/02/2014   BILITOT 1.1 11/02/2014       Hypertension    Past Surgical History:  Procedure Laterality Date   CATARACT EXTRACTION Left    MASTECTOMY Left    TUBAL LIGATION      Social History:  reports that she has been smoking cigarettes. She has a 1.00 pack-year smoking history. She has never used smokeless tobacco. She reports current alcohol use. She reports that she does not use drugs.   Allergies  Allergen Reactions   Ace Inhibitors Cough    ????   Chlorthalidone     REACTION: unspecified   Metformin     REACTION: gi side effects   Penicillins     REACTION: urticaria (hives)  Sulfamethoxazole     REACTION: questionable    Family History  Problem Relation Age of Onset   Stroke Mother    Cancer Father        prostate   Diabetes Sister    Pancreatic cancer Sister      Prior to Admission medications   Medication Sig Start Date End Date Taking? Authorizing Provider  aspirin 325 MG tablet Take 325 mg by mouth at bedtime. Takes half pill daily     [provider]  atenolol (TENORMIN) 100 MG tablet TAKE 1 TABLET BY MOUTH TWICE A DAY 05/13/21   Billie Ruddy, MD  blood glucose meter kit and supplies KIT Dispense based on patient and insurance preference. Use up to four times daily as directed. 06/05/21   Billie Ruddy, MD  Blood Glucose Monitoring Suppl [facility] Karsten Fells) w/Device KIT Test twice daily (onetouch ultra) 11/04/17   Billie Ruddy, MD  Blood Pressure Monitoring (BLOOD PRESSURE CUFF) MISC Use daily to monitor blood pressue 11/04/17   Billie Ruddy, MD  Calcium Carbonate-Vitamin D (CALCIUM PLUS VITAMIN D) 600-100 MG-UNIT CAPS Take 1 capsule by mouth 2 (two) times daily.    [provider]  Continuous Blood Gluc Receiver (FREESTYLE LIBRE READER) DEVI 1 Device by Does not apply route 4 (four) times daily as needed. 06/05/21   Billie Ruddy, MD  Continuous Blood Gluc Sensor (FREESTYLE LIBRE 14 DAY SENSOR) MISC 1 Device by Does not apply route every 14 (fourteen) days. 06/05/21   Billie Ruddy, MD  furosemide (LASIX) 20 MG tablet Take 1 tablet (20 mg total) by mouth daily for 3 days. 03/08/21 03/11/21  Billie Ruddy, MD  glipiZIDE (GLUCOTROL XL) 10 MG 24 hr tablet TAKE 1 TABLET BY MOUTH TWICE A DAY 03/07/21   Billie Ruddy, MD  Lancets [facility] ULTRASOFT) lancets Use as instructed 02/18/18   Billie Ruddy, MD  [facility] ULTRA test strip USE AS INSTRUCTED. FOR 3 TIMES A DAY TESTING, MORE FOR HYPO OR HYPERGLYCEMIA. 08/30/20   Billie Ruddy, MD  potassium chloride SA (KLOR-CON) 20 MEQ tablet Take 1 tablet (20 mEq total) by mouth daily. Patient not taking: Reported on 06/05/2021 03/08/21   Billie Ruddy, MD  TRULICITY 1.5 LN/9.8XQ SOPN INJECT 1.5 MG INTO THE SKIN ONCE A WEEK. 10/24/20   Billie Ruddy, MD    Physical Exam: BP (!) 121/49 (BP Location: Right Arm)   Pulse 77   Temp 98.9 F (37.2 C) (Oral)   Resp 16   Ht 5' 2"  (1.575 m)   Wt 67.5 kg   SpO2 100%   BMI 27.23 kg/m   General: 75  y.o. year-old female well developed well nourished in no acute distress.  Alert and oriented x3. HEENT: Pale conjunctiva, NCAT, EOMI Neck: Supple, trachea medial Cardiovascular: Regular rate and rhythm with no rubs or gallops.  No thyromegaly or JVD noted.  No lower extremity edema. 2/4 pulses in all 4 extremities. Respiratory: Clear to auscultation with no wheezes or rales. Good inspiratory effort. Abdomen: Soft, nontender nondistended with normal bowel sounds x4 quadrants. Muskuloskeletal: No cyanosis, clubbing or edema noted bilaterally Neuro: CN II-XII intact, strength 5/5 x 4, sensation, reflexes intact Lymphatics: No supraclavicular or axillary lymphadenopathy Skin: No ulcerative lesions noted or rashes Psychiatry: Judgement and insight appear normal. Mood is appropriate for condition and setting          Labs on Admission:  Basic Metabolic Panel: Recent Labs  Lab 06/05/21  1222 06/06/21 1205  NA 137 136  K 4.6 4.8  CL 104 104  CO2 27 26  GLUCOSE 219* 323*  BUN 18 17  CREATININE 1.23* 1.24*  CALCIUM 9.5 9.5   Liver Function Tests: Recent Labs  Lab 06/05/21 1222 06/06/21 1205  AST 22 19  ALT 12 10  ALKPHOS 88 92  BILITOT 0.9 0.9  PROT 7.0 7.0  ALBUMIN 3.7 3.7   No results for input(s): LIPASE, AMYLASE in the last 168 hours. No results for input(s): AMMONIA in the last 168 hours. CBC: Recent Labs  Lab 06/05/21 1222 06/06/21 1205  WBC 5.5 5.9  NEUTROABS 2.8 3.0  HGB 7.1 Repeated and verified X2.* 7.4*  HCT 23.5 Repeated and verified X2.* 26.6*  MCV 81.3 86.4  PLT 96.0 Repeated and verified X2.* 113*   Cardiac Enzymes: No results for input(s): CKTOTAL, CKMB, CKMBINDEX, TROPONINI in the last 168 hours.  BNP (last 3 results) No results for input(s): BNP in the last 8760 hours.  ProBNP (last 3 results) Recent Labs    03/07/21 1550 06/05/21 1222  PROBNP 229.0* 683.0*    CBG: Recent Labs  Lab 06/06/21 1657 06/06/21 1806 06/06/21 2125  GLUCAP 134*  137* 168*    Radiological Exams on Admission: DG Chest 2 View  Result Date: 06/06/2021 CLINICAL DATA:  Shortness of breath EXAM: CHEST - 2 VIEW COMPARISON:  03/11/2021 FINDINGS: The heart size and mediastinal contours are within normal limits. Aortic atherosclerosis. Both lungs are clear. The visualized skeletal structures are unremarkable. IMPRESSION: No active cardiopulmonary disease. Electronically Signed   By: Donavan Foil M.D.   On: 06/06/2021 15:51    EKG: I independently viewed the EKG done and my findings are as followed: Normal sinus rhythm at a rate of 82 bpm  Assessment/Plan Present on Admission:  Symptomatic anemia  Essential hypertension  Chronic diastolic CHF (congestive heart failure) (HCC)  Principal Problem:   Symptomatic anemia Active Problems:   Essential hypertension   Chronic diastolic CHF (congestive heart failure) (HCC)   Hyperglycemia due to diabetes mellitus (HCC)   Symptomatic anemia Iron deficiency anemia Iron studies H/H= 7.4/26.6, this was 7.1/23.5 on 06/05/2021 Hemoccult was negative Type and crossmatch will be done 1 unit of PRBC will be transfused/reserved 1 unit of IV iron will be transfused Continue ferrous sulfate Continue to monitor H&H  Hyperglycemia secondary to type 2 diabetes mellitus Continue ISS and hypoglycemic protocol Glipizide will be held at this time  Elevated BNP rule out acute on chronic CHF BNP checked at PCPs office yesterday was 683 Chest x-ray showed no active cardiopulmonary disease Continue total input/output, daily weights and fluid restriction Continue Cardiac diet  Echocardiogram done on 08/01/2017 showed LVEF of 60 to 65% with abnormal LV relaxation (G1 DD).  Echocardiogram will be done in the morning in the morning   Essential hypertension Continue atenolol   DVT prophylaxis: SCDs  Code Status: Full code  Family Communication: None at bedside  Disposition Plan:  Patient is from:                         home Anticipated DC to:                   SNF or family members home Anticipated DC date:               2-3 days Anticipated DC barriers:          Patient requires inpatient management  due to symptomatic anemia requiring blood transfusion  Consults called: None  Admission status: Observation    Bernadette Hoit MD [facility]  06/06/2021, 10:23 PM

## 2021-06-06 NOTE — ED Triage Notes (Signed)
Patient sent to ER by Dr. Volanda Napoleon for low hemoglobin of 7.6. Patient denies pain. Patient reports she is a type 2 diabetic and only has some foot pain

## 2021-06-06 NOTE — ED Notes (Signed)
Report called to Sayre [place] cox, [facility] and allison Rn on [employer] [facility].

## 2021-06-06 NOTE — ED Provider Notes (Signed)
[facility] EMERGENCY DEPT Provider Note   CSN: [id number] Arrival date & time: 06/06/21  1125     History Chief Complaint  Patient presents with   Abnormal labs    Anita Mccormick is a 75 y.o. female.  Presenting to the emergency room with concern for abnormal blood work.  Followed up with her primary care doctor. Yesterday due to feeling generally unwell.  Reports that she has been having shortness of breath with exertion and general fatigue over the past month or so.  Reports that this has not drastically changed over the past couple days.  Denies any chest pain.  Reports that a couple months ago she had some dark stools but no recent dark stools.  Stools lately have been brown.  She does not have any complaints at rest at present.  HPI     Past Medical History:  Diagnosis Date   Cancer [facility])    history of no adjunctive RX( no chemo no RT)   Cerebrovascular accident ([place])    [place], HX OF 12/10/2006   Qualifier: Diagnosis of  By: Leanne Chang MD, Bruce     Diabetes mellitus    Type 2   Diabetes mellitus type II, controlled ([place]) 12/10/2006   Poor control on Januvia 116m and glipizide 190mXL Lab Results  Component Value Date   HGBA1C 8.3* 11/02/2014        Eczema    Fatty liver disease, nonalcoholic 1261/53/7943 Per Dr. SwLeanne Changab Results  Component Value Date   ALT 31 11/02/2014   AST 57* 11/02/2014   ALKPHOS 104 11/02/2014   BILITOT 1.1 11/02/2014       Hypertension     Patient Active Problem List   Diagnosis Date Noted   Atherosclerosis of aorta ([place]10/22/2021   Cirrhosis of liver without ascites (HCHol[place]10/22/2021   Dysuria 02/20/2018   Anemia    Sepsis due to Escherichia coli (E. coli) ([facility]   AKI (acute kidney injury) ([place]ford[place]   Sepsis ([place]02/23/2019   UTI (urinary tract infection) 09/19/2017   Chronic kidney disease (CKD), stage II (mild) 09/19/2017   Leukopenia 09/19/2017   Chronic diastolic CHF (congestive heart failure) (HCD[place]Forest02/23/2019    Statins contraindicated 06/12/2017   Hepatotoxicity due to statin drug 06/12/2017   History of adenomatous polyp of colon 03/18/2017   Viral URI with cough 01/17/2017   Thrombocytopenia (HCF[place]la[place]05/24/2017   Insomnia 08/21/2015   Nicotine use disorder 05/21/2015   Eczema 05/21/2015   Fatty liver disease, nonalcoholic 1227/61/4709 Hyperlipidemia 11/03/2007   History of CVA (cerebrovascular accident) 10/20/2007   Type II diabetes mellitus with renal manifestations (HCCowen05/15/2008   Essential hypertension 12/10/2006   History of breast cancer 12/10/2006    Past Surgical History:  Procedure Laterality Date   CATARACT EXTRACTION Left    MASTECTOMY Left    TUBAL LIGATION       OB History   No obstetric history on file.     Family History  Problem Relation Age of Onset   Stroke Mother    Cancer Father        prostate   Diabetes Sister    Pancreatic cancer Sister     Social History   Tobacco Use   Smoking status: Every Day    Packs/day: 0.10    Years: 10.00    Pack years: 1.00    Types: Cigarettes   Smokeless tobacco: Never   Tobacco comments:  3 cigs a day :not smoking due to illness (01/17/17)  Vaping Use   Vaping Use: Never used  Substance Use Topics   Alcohol use: Yes    Alcohol/week: 0.0 standard drinks    Comment: a drink every now and again   Drug use: No    Home Medications Prior to Admission medications   Medication Sig Start Date End Date Taking? Authorizing Provider  aspirin 325 MG tablet Take 325 mg by mouth at bedtime. Takes half pill daily    [provider]  atenolol (TENORMIN) 100 MG tablet TAKE 1 TABLET BY MOUTH TWICE A DAY 05/13/21   Billie Ruddy, MD  blood glucose meter kit and supplies KIT Dispense based on patient and insurance preference. Use up to four times daily as directed. 06/05/21   Billie Ruddy, MD  Blood Glucose Monitoring Suppl [facility] Karsten Fells) w/Device KIT Test twice daily (onetouch ultra) 11/04/17   Billie Ruddy, MD  Blood Pressure Monitoring (BLOOD PRESSURE CUFF) MISC Use daily to monitor blood pressue 11/04/17   Billie Ruddy, MD  Calcium Carbonate-Vitamin D (CALCIUM PLUS VITAMIN D) 600-100 MG-UNIT CAPS Take 1 capsule by mouth 2 (two) times daily.    [provider]  Continuous Blood Gluc Receiver (FREESTYLE LIBRE READER) DEVI 1 Device by Does not apply route 4 (four) times daily as needed. 06/05/21   Billie Ruddy, MD  Continuous Blood Gluc Sensor (FREESTYLE LIBRE 14 DAY SENSOR) MISC 1 Device by Does not apply route every 14 (fourteen) days. 06/05/21   Billie Ruddy, MD  furosemide (LASIX) 20 MG tablet Take 1 tablet (20 mg total) by mouth daily for 3 days. 03/08/21 03/11/21  Billie Ruddy, MD  glipiZIDE (GLUCOTROL XL) 10 MG 24 hr tablet TAKE 1 TABLET BY MOUTH TWICE A DAY 03/07/21   Billie Ruddy, MD  Lancets [facility] ULTRASOFT) lancets Use as instructed 02/18/18   Billie Ruddy, MD  [facility] ULTRA test strip USE AS INSTRUCTED. FOR 3 TIMES A DAY TESTING, MORE FOR HYPO OR HYPERGLYCEMIA. 08/30/20   Billie Ruddy, MD  potassium chloride SA (KLOR-CON) 20 MEQ tablet Take 1 tablet (20 mEq total) by mouth daily. Patient not taking: Reported on 06/05/2021 03/08/21   Billie Ruddy, MD  TRULICITY 1.5 UX/3.2GM SOPN INJECT 1.5 MG INTO THE SKIN ONCE A WEEK. 10/24/20   Billie Ruddy, MD    Allergies    Ace inhibitors, Chlorthalidone, Metformin, Penicillins, and Sulfamethoxazole  Review of Systems   Review of Systems  Constitutional:  Positive for fatigue. Negative for chills and fever.  HENT:  Negative for ear pain and sore throat.   Eyes:  Negative for pain and visual disturbance.  Respiratory:  Positive for shortness of breath. Negative for cough.   Cardiovascular:  Positive for palpitations. Negative for chest pain.  Gastrointestinal:  Negative for abdominal pain and vomiting.  Genitourinary:  Negative for dysuria and hematuria.  Musculoskeletal:  Negative for arthralgias  and back pain.  Skin:  Negative for color change and rash.  Neurological:  Negative for seizures and syncope.  All other systems reviewed and are negative.  Physical Exam Updated Vital Signs BP (!) 148/55   Pulse 79   Temp (!) 97 F (36.1 C)   Resp 18   SpO2 100%   Physical Exam  ED Results / Procedures / Treatments   Labs (all labs ordered are listed, but only abnormal results are displayed) Labs Reviewed  COMPREHENSIVE METABOLIC PANEL -  Abnormal; Notable for the following components:      Result Value   Glucose, Bld 323 (*)    Creatinine, Ser 1.24 (*)    GFR, Estimated 46 (*)    All other components within normal limits  CBC WITH DIFFERENTIAL/PLATELET - Abnormal; Notable for the following components:   RBC 3.08 (*)    Hemoglobin 7.4 (*)    HCT 26.6 (*)    MCH 24.0 (*)    MCHC 27.8 (*)    RDW 18.2 (*)    Platelets 113 (*)    Eosinophils Absolute 1.7 (*)    All other components within normal limits  RESP PANEL BY RT-PCR (FLU A&B, COVID) ARPGX2  OCCULT BLOOD X 1 CARD TO LAB, STOOL  PROTIME-INR    EKG None  Radiology No results found.  Procedures Procedures   Medications Ordered in ED Medications - No data to display  ED Course  I have reviewed the triage vital signs and the nursing notes.  Pertinent labs & imaging results that were available during my care of the patient were reviewed by me and considered in my medical decision making (see chart for details).    MDM Rules/Calculators/A&P                           75 year old lady presents to ER with concern for abnormal lab value.  Hemoglobin yesterday 7.1.  Repeat today 7.4.  Hemoccult negative.  No clear source for anemia at present.  Anemia panel yesterday suggestive of iron deficiency anemia.  Patient does endorse significant dyspnea with exertion though she is currently asymptomatic at rest and normal vital signs.  Discussed case with her primary doctor who felt patient would benefit from blood  transfusion, hematology consultation.  Given patient's symptomatic anemia, agree that she would likely benefit from these measures.  Will consult hospitalist for admit for further maangement of symptoamtic anemia.    Final Clinical Impression(s) / ED Diagnoses Final diagnoses:  Symptomatic anemia    Rx / DC Orders ED Discharge Orders     None        Lucrezia Starch, MD 06/06/21 1544

## 2021-06-06 NOTE — ED Notes (Signed)
Patient transported to X-ray 

## 2021-06-06 NOTE — ED Notes (Signed)
Occult blood card to bedside , pt states she does not want a rectal exam.pt states she is not bleeding and had a colonoscopy. Defer to md

## 2021-06-07 ENCOUNTER — Observation Stay (HOSPITAL_BASED_OUTPATIENT_CLINIC_OR_DEPARTMENT_OTHER): Payer: Medicare HMO

## 2021-06-07 DIAGNOSIS — I5032 Chronic diastolic (congestive) heart failure: Secondary | ICD-10-CM | POA: Diagnosis not present

## 2021-06-07 DIAGNOSIS — D649 Anemia, unspecified: Secondary | ICD-10-CM | POA: Diagnosis not present

## 2021-06-07 LAB — CBC
HCT: 28.2 % — ABNORMAL LOW (ref 36.0–46.0)
Hemoglobin: 8.4 g/dL — ABNORMAL LOW (ref 12.0–15.0)
MCH: 25.3 pg — ABNORMAL LOW (ref 26.0–34.0)
MCHC: 29.8 g/dL — ABNORMAL LOW (ref 30.0–36.0)
MCV: 84.9 fL (ref 80.0–100.0)
Platelets: 103 10*3/uL — ABNORMAL LOW (ref 150–400)
RBC: 3.32 MIL/uL — ABNORMAL LOW (ref 3.87–5.11)
RDW: 17.1 % — ABNORMAL HIGH (ref 11.5–15.5)
WBC: 6.1 10*3/uL (ref 4.0–10.5)
nRBC: 0 % (ref 0.0–0.2)

## 2021-06-07 LAB — TYPE AND SCREEN
ABO/RH(D): B POS
Antibody Screen: NEGATIVE
Unit division: 0

## 2021-06-07 LAB — COMPREHENSIVE METABOLIC PANEL
ALT: 14 U/L (ref 0–44)
AST: 22 U/L (ref 15–41)
Albumin: 3 g/dL — ABNORMAL LOW (ref 3.5–5.0)
Alkaline Phosphatase: 74 U/L (ref 38–126)
Anion gap: 4 — ABNORMAL LOW (ref 5–15)
BUN: 13 mg/dL (ref 8–23)
CO2: 25 mmol/L (ref 22–32)
Calcium: 8.8 mg/dL — ABNORMAL LOW (ref 8.9–10.3)
Chloride: 109 mmol/L (ref 98–111)
Creatinine, Ser: 1.11 mg/dL — ABNORMAL HIGH (ref 0.44–1.00)
GFR, Estimated: 52 mL/min — ABNORMAL LOW (ref >60)
Glucose, Bld: 145 mg/dL — ABNORMAL HIGH (ref 70–99)
Potassium: 4.3 mmol/L (ref 3.5–5.1)
Sodium: 138 mmol/L (ref 135–145)
Total Bilirubin: 1.9 mg/dL — ABNORMAL HIGH (ref 0.3–1.2)
Total Protein: 6.2 g/dL — ABNORMAL LOW (ref 6.5–8.1)

## 2021-06-07 LAB — GLUCOSE, CAPILLARY
Glucose-Capillary: 136 mg/dL — ABNORMAL HIGH (ref 70–99)
Glucose-Capillary: 159 mg/dL — ABNORMAL HIGH (ref 70–99)
Glucose-Capillary: 191 mg/dL — ABNORMAL HIGH (ref 70–99)
Glucose-Capillary: 329 mg/dL — ABNORMAL HIGH (ref 70–99)

## 2021-06-07 LAB — BPAM RBC
Blood Product Expiration Date: 202211252359
ISSUE DATE / TIME: 202211102343
Unit Type and Rh: 7300

## 2021-06-07 LAB — ECHOCARDIOGRAM COMPLETE
Area-P 1/2: 3.48 cm2
Height: 62 in
S' Lateral: 2.9 cm
Weight: 2382.4 oz

## 2021-06-07 LAB — PHOSPHORUS: Phosphorus: 3.4 mg/dL (ref 2.5–4.6)

## 2021-06-07 LAB — MAGNESIUM: Magnesium: 2.1 mg/dL (ref 1.7–2.4)

## 2021-06-07 LAB — HEMOGLOBIN AND HEMATOCRIT, BLOOD
HCT: 28.2 % — ABNORMAL LOW (ref 36.0–46.0)
Hemoglobin: 8.6 g/dL — ABNORMAL LOW (ref 12.0–15.0)

## 2021-06-07 MED ORDER — FERROUS SULFATE 325 (65 FE) MG PO TABS: 325.0000 mg | ORAL_TABLET | Freq: Every day | ORAL | 3 refills | Status: DC

## 2021-06-07 MED ORDER — SODIUM CHLORIDE 0.9 % IV SOLN
125.0000 mg | Freq: Once | INTRAVENOUS | Status: AC
  Administered 2021-06-07: 125 mg via INTRAVENOUS
  Filled 2021-06-07: qty 10

## 2021-06-07 MED ORDER — PANTOPRAZOLE SODIUM 40 MG PO TBEC: 40.0000 mg | DELAYED_RELEASE_TABLET | Freq: Every day | ORAL | 1 refills | Status: DC

## 2021-06-07 NOTE — Progress Notes (Signed)
DISCHARGE NOTE HOME [name] to be discharged Home per MD order. Discussed prescriptions and follow up appointments with the patient. Prescriptions given to patient; medication list explained in detail. Patient verbalized understanding.  Skin clean, dry and intact without evidence of skin break down, no evidence of skin tears noted. IV catheter discontinued intact. Site without signs and symptoms of complications. Dressing and pressure applied. Pt denies pain at the site currently. No complaints noted.  Patient free of lines, drains, and wounds.   An After Visit Summary (AVS) was printed and given to the patient. Patient escorted via wheelchair, and discharged home via private auto.  Berneta Levins, RN

## 2021-06-07 NOTE — Discharge Summary (Signed)
Physician Discharge Summary  Anita Mccormick FSF:[id number] DOB: [birth year] DOA: 06/06/2021  PCP: Billie Ruddy, MD  Admit date: 06/06/2021 Discharge date: 06/07/2021  Time spent: 27 minutes  Recommendations for Outpatient Follow-up:  Needs CBC, iron level and reticulocyte count in about 1 week from discharge in PCP office Consider outpatient EGD/colonoscopy not emergently for work-up of the   Discharge Diagnoses:  MAIN problem for hospitalization   Symptomatic anemia  Please see below for itemized issues addressed in [place]- refer to other progress notes for clarity if needed  Discharge Condition: Improved   Diet recommendation: Heart healthy  Filed Weights   06/06/21 1806  Weight: 67.5 kg    History of present illness:  75 year old black female known history DM TY 2 HTN, left breast cancer status postmastectomy over 30 years ago HFpEF Developed DOE over 1 month generalized weakness and was found to have hemoglobin of 7.1 and was transferred to [facility]  Blood count was repeated found to be hemoglobin 7.4 patient was given 1 unit of PRBC and 1 unit of IV iron  Patient's hemoglobin responded well to the 8.6 range  Patient had mild AKI on admission likely from some volume depletion and hypovolemia from low blood count and this resolved also  We discussed that the patient would probably need to be on iron going forward given her moderate to severe iron deficiency with iron level of 22 and TSAT of 4 She may benefit from IV iron discussion in the outpatient setting based on labs  I have also told her to take Protonix as we cannot clearly rule out upper GI source without scope although she has no alarm symptoms and this can be considered as an outpatient   Discharge Exam: Vitals:   06/06/21 2337 06/07/21 0007  BP: 101/60 (!) 111/49  Pulse: 83 80  Resp: 16 16  Temp: 99.1 F (37.3 C) 99 F (37.2 C)  SpO2: 99% 99%    Subj on day of d/c   Awake alert coherent  no distress seems comfortable Tells me she feels much better  General Exam on discharge  EOMI NCAT no pallor no icterus CTA B no added sound no rales no rhonchi Abdomen soft nontender no rebound no guarding Power 5/5 Psych euthymic  Discharge Instructions   Discharge Instructions     Diet - low sodium heart healthy   Complete by: As directed    Discharge instructions   Complete by: As directed    Please take iron once daily and follow-up with your primary physician for routine labs in about 1 week to ensure that you are on the right track I will prescribe Protonix also once daily given the fact that you may have some mild gastritis causing some of your symptoms-if this does not improve you do not need to take it Talk to your doctor about your glycemic control   Increase activity slowly   Complete by: As directed    No wound care   Complete by: As directed       Allergies as of 06/07/2021       Reactions   Ace Inhibitors Cough   ????   Chlorthalidone    REACTION: unspecified   Metformin    REACTION: gi side effects   Penicillins    REACTION: urticaria (hives)   Sulfamethoxazole    REACTION: questionable        Medication List     STOP taking these medications    potassium chloride SA  20 MEQ tablet Commonly known as: KLOR-CON       TAKE these medications    aspirin 325 MG tablet Take 325 mg by mouth at bedtime. Takes half pill daily   atenolol 100 MG tablet Commonly known as: TENORMIN TAKE 1 TABLET BY MOUTH TWICE A DAY   blood glucose meter kit and supplies Kit Dispense based on patient and insurance preference. Use up to four times daily as directed.   Blood Pressure Cuff Misc Use daily to monitor blood pressue   Calcium Plus Vitamin D 600-100 MG-UNIT Caps Take 1 capsule by mouth 2 (two) times daily.   ferrous sulfate 325 (65 FE) MG tablet Take 1 tablet (325 mg total) by mouth daily with breakfast. Start taking on: June 08, 2021    FreeStyle Libre 14 Day Sensor Misc 1 Device by Does not apply route every 14 (fourteen) days.   FreeStyle Libre Reader Devi 1 Device by Does not apply route 4 (four) times daily as needed.   furosemide 20 MG tablet Commonly known as: LASIX Take 1 tablet (20 mg total) by mouth daily for 3 days.   glipiZIDE 10 MG 24 hr tablet Commonly known as: GLUCOTROL XL TAKE 1 TABLET BY MOUTH TWICE A DAY   OneTouch Ultra test strip Generic drug: glucose blood USE AS INSTRUCTED. FOR 3 TIMES A DAY TESTING, MORE FOR HYPO OR HYPERGLYCEMIA.   OneTouch UltraLink w/Device Kit Test twice daily (onetouch ultra)   onetouch ultrasoft lancets Use as instructed   pantoprazole 40 MG tablet Commonly known as: Protonix Take 1 tablet (40 mg total) by mouth daily.   Trulicity 1.5 WV/3.7TG Sopn Generic drug: Dulaglutide INJECT 1.5 MG INTO THE SKIN ONCE A WEEK.       Allergies  Allergen Reactions   Ace Inhibitors Cough    ????   Chlorthalidone     REACTION: unspecified   Metformin     REACTION: gi side effects   Penicillins     REACTION: urticaria (hives)   Sulfamethoxazole     REACTION: questionable      The results of significant diagnostics from this hospitalization (including imaging, microbiology, ancillary and laboratory) are listed below for reference.    Significant Diagnostic Studies: DG Chest 2 View  Result Date: 06/06/2021 CLINICAL DATA:  Shortness of breath EXAM: CHEST - 2 VIEW COMPARISON:  03/11/2021 FINDINGS: The heart size and mediastinal contours are within normal limits. Aortic atherosclerosis. Both lungs are clear. The visualized skeletal structures are unremarkable. IMPRESSION: No active cardiopulmonary disease. Electronically Signed   By: Donavan Foil M.D.   On: 06/06/2021 15:51    Microbiology: Recent Results (from the past 240 hour(s))  Resp Panel by RT-PCR (Flu A&B, Covid) Nasopharyngeal Swab     Status: None   Collection Time: 06/06/21  3:25 PM   Specimen:  Nasopharyngeal Swab; Nasopharyngeal(NP) swabs in vial transport medium  Result Value Ref Range Status   SARS Coronavirus 2 by RT PCR NEGATIVE NEGATIVE Final    Comment: (NOTE) SARS-CoV-2 target nucleic acids are NOT DETECTED.  The SARS-CoV-2 RNA is generally detectable in upper respiratory specimens during the acute phase of infection. The lowest concentration of SARS-CoV-2 viral copies this assay can detect is 138 copies/mL. A negative result does not preclude SARS-Cov-2 infection and should not be used as the sole basis for treatment or other patient management decisions. A negative result may occur with  improper specimen collection/handling, submission of specimen other than nasopharyngeal swab, presence of viral mutation(s) within the  areas targeted by this assay, and inadequate number of viral copies(<138 copies/mL). A negative result must be combined with clinical observations, patient history, and epidemiological information. The expected result is Negative.  Fact Sheet for Patients:  EntrepreneurPulse.com.au  Fact Sheet for Healthcare Providers:  IncredibleEmployment.be  This test is no t yet approved or cleared by the [place] FDA and  has been authorized for detection and/or diagnosis of SARS-CoV-2 by FDA under an Emergency Use Authorization (EUA). This EUA will remain  in effect (meaning this test can be used) for the duration of the COVID-19 declaration under Section 564(b)(1) of the Act, 21 U.S.C.section 360bbb-3(b)(1), unless the authorization is terminated  or revoked sooner.       Influenza A by PCR NEGATIVE NEGATIVE Final   Influenza B by PCR NEGATIVE NEGATIVE Final    Comment: (NOTE) The Xpert Xpress SARS-CoV-2/FLU/RSV plus assay is intended as an aid in the diagnosis of influenza from Nasopharyngeal swab specimens and should not be used as a sole basis for treatment. Nasal washings and aspirates are unacceptable for  Xpert Xpress SARS-CoV-2/FLU/RSV testing.  Fact Sheet for Patients: EntrepreneurPulse.com.au  Fact Sheet for Healthcare Providers: IncredibleEmployment.be  This test is not yet approved or cleared by the [place] FDA and has been authorized for detection and/or diagnosis of SARS-CoV-2 by FDA under an Emergency Use Authorization (EUA). This EUA will remain in effect (meaning this test can be used) for the duration of the COVID-19 declaration under Section 564(b)(1) of the Act, 21 U.S.C. section 360bbb-3(b)(1), unless the authorization is terminated or revoked.  Performed at [facility], [address], [place], [place] [address]      Labs: Basic Metabolic Panel: Recent Labs  Lab 06/05/21 1222 06/06/21 1205 06/07/21 0637  NA 137 136 138  K 4.6 4.8 4.3  CL 104 104 109  CO2 27 26 25   GLUCOSE 219* 323* 145*  BUN 18 17 13   CREATININE 1.23* 1.24* 1.11*  CALCIUM 9.5 9.5 8.8*  MG  --   --  2.1  PHOS  --   --  3.4   Liver Function Tests: Recent Labs  Lab 06/05/21 1222 06/06/21 1205 06/07/21 0637  AST 22 19 22   ALT 12 10 14   ALKPHOS 88 92 74  BILITOT 0.9 0.9 1.9*  PROT 7.0 7.0 6.2*  ALBUMIN 3.7 3.7 3.0*   No results for input(s): LIPASE, AMYLASE in the last 168 hours. No results for input(s): AMMONIA in the last 168 hours. CBC: Recent Labs  Lab 06/05/21 1222 06/06/21 1205 06/07/21 0637  WBC 5.5 5.9  --   NEUTROABS 2.8 3.0  --   HGB 7.1 Repeated and verified X2.* 7.4* 8.6*  HCT 23.5 Repeated and verified X2.* 26.6* 28.2*  MCV 81.3 86.4  --   PLT 96.0 Repeated and verified X2.* 113*  --    Cardiac Enzymes: No results for input(s): CKTOTAL, CKMB, CKMBINDEX, TROPONINI in the last 168 hours. BNP: BNP (last 3 results) No results for input(s): BNP in the last 8760 hours.  ProBNP (last 3 results) Recent Labs    03/07/21 1550 06/05/21 1222  PROBNP 229.0* 683.0*    CBG: Recent Labs  Lab  06/06/21 1806 06/06/21 2125 06/07/21 0020 06/07/21 0605 06/07/21 0840  GLUCAP 137* 168* 191* 136* 329*       Signed:  Nita Sells MD   [facility] 06/07/2021, 8:58 AM

## 2021-06-07 NOTE — Progress Notes (Signed)
  Echocardiogram 2D Echocardiogram has been performed.  Anita Mccormick 06/07/2021, 1:28 PM

## 2021-06-12 ENCOUNTER — Telehealth: Payer: Self-pay | Admitting: Pharmacist

## 2021-06-12 NOTE — Chronic Care Management (AMB) (Signed)
    Chronic Care Management Pharmacy Assistant   Name: Anita Mccormick  MRN: [id number] DOB: [birth year]  Reason for Encounter: To advise patient she needs to sign patient assistance application for Trulicity,  she will need to go to Dr. Volanda Napoleon office  to do this. Patient advised of the need to do this, voiced clear understanding.  She will attempt to go this coming Monday.     Care Gaps: AWV - canceled 03/12/21, to be rescheduled.  Zoster vaccines - never done Ophthalmology exam - overdue since 2019 Foot exam - overdue since 2020 Flu vaccine - due  Star Rating Drugs: Glipizide 10mg  - last filled on 3/33/83 29VB [facility] Trulicity 1.5mg  - last filled on 04/04/21 28DS at [place] Pharmacist Assistant [phone]

## 2021-06-17 NOTE — Telephone Encounter (Signed)
Called pharmacy to try to provide 1 time use free trial offer for Trulicity and was denied as patient has used previously.  Called patient to make her aware and she will call the office when she is running low on current supply to try to provide her with samples.

## 2021-06-24 ENCOUNTER — Telehealth: Payer: Self-pay | Admitting: Family Medicine

## 2021-06-24 NOTE — Telephone Encounter (Signed)
Left message for patient to call back and schedule Medicare Annual Wellness Visit (AWV) either virtually or in office. Left  my Herbie Drape number [phone]   Last AWV ;06/30/14 please schedule at anytime with [facility] Nurse Health Advisor 1 or 2   This should be a 45 minute visit.

## 2021-06-26 ENCOUNTER — Telehealth: Payer: Self-pay | Admitting: Pharmacist

## 2021-06-26 NOTE — Chronic Care Management (AMB) (Signed)
    Chronic Care Management Pharmacy Assistant   Name: Anita Mccormick  MRN: [id number] DOB: 08-Oct-1945  Reason for Encounter: Trulicity patient assistance follow up.  Spoke with [name], patient has been approved through Dec of 2023. Pharmacy should call patient to verify address any day now and then her first order should ship in 12-14 days.  Care Gaps: AWV - canceled 03/12/21, to be rescheduled.  Zoster vaccines - never done Ophthalmology exam - overdue since 2019 Foot exam - overdue since 2020 Flu vaccine - due  Star Rating Drugs: Glipizide 10mg  - last [facility] on 54/83/2346 88TL [facility] Tr[facility] 1.5[facility]  - last filled on 05/31/2021 28DS at [place] Pharmacist Assistant [phone]

## 2021-06-27 NOTE — Chronic Care Management (AMB) (Signed)
Spoke with patient and notified her Trulicity has been approved though 06/2022 and to be expecting a call from the pharmaceutical company to verify her address.

## 2021-07-05 ENCOUNTER — Ambulatory Visit: Payer: Medicare HMO | Admitting: Family Medicine

## 2021-07-05 ENCOUNTER — Other Ambulatory Visit: Payer: Self-pay | Admitting: Family Medicine

## 2021-07-05 DIAGNOSIS — I5033 Acute on chronic diastolic (congestive) heart failure: Secondary | ICD-10-CM

## 2021-07-09 ENCOUNTER — Telehealth: Payer: Self-pay | Admitting: Pharmacist

## 2021-07-09 NOTE — Chronic Care Management (AMB) (Signed)
    Chronic Care Management Pharmacy Assistant   Name: Anita Mccormick  MRN: [id number] DOB: June 09, 1946  07/10/2021 APPOINTMENT REMINDER  Anita Mccormick was reminded to have all medications, supplements and any blood glucose and blood pressure readings available for review with Anita Mccormick, Anita Mccormick, at her office visit on 07/10/2021 at 2:00.   Questions: Have you had any recent office visit or specialist visit outside of [place]? Patient states she hasn't had any visits outside of [facility]  Are there any concerns you would like to discuss during your office visit? Patient has no concerns at this time  Are you having any problems obtaining your medications? Patient denies any problems getting meds at this time.  If patient has any PAP medications ask if they are having any problems getting their PAP medication or refill? Patient states she just received her first shipment of Trulicity.  Care Gaps: AWV - canceled 03/12/21, to be rescheduled. Last BP - 142/66 on 11/09/222 Last A1C -  6.6 on 06/05/2021 Zoster vaccines - never done Ophthalmology exam - overdue since 2019 Foot exam - overdue since 2020 Flu vaccine - due  Star Rating Drug: Glipizide 10mg  - [facility]/21/9[facility] 46EV [facility]icity 1.5[facility]  - filled by patient assistance  Any gaps in medications fill history? No  Gennie Al[facility]  Catering manager [phone]

## 2021-07-10 ENCOUNTER — Ambulatory Visit (INDEPENDENT_AMBULATORY_CARE_PROVIDER_SITE_OTHER): Payer: Medicare HMO | Admitting: Pharmacist

## 2021-07-10 ENCOUNTER — Ambulatory Visit (INDEPENDENT_AMBULATORY_CARE_PROVIDER_SITE_OTHER): Payer: Medicare HMO | Admitting: Family Medicine

## 2021-07-10 VITALS — BP 140/84 | HR 79 | Temp 98.6°F | Wt 150.6 lb

## 2021-07-10 DIAGNOSIS — E1129 Type 2 diabetes mellitus with other diabetic kidney complication: Secondary | ICD-10-CM | POA: Diagnosis not present

## 2021-07-10 DIAGNOSIS — I5032 Chronic diastolic (congestive) heart failure: Secondary | ICD-10-CM | POA: Diagnosis not present

## 2021-07-10 DIAGNOSIS — Z862 Personal history of diseases of the blood and blood-forming organs and certain disorders involving the immune mechanism: Secondary | ICD-10-CM

## 2021-07-10 DIAGNOSIS — L84 Corns and callosities: Secondary | ICD-10-CM

## 2021-07-10 DIAGNOSIS — D509 Iron deficiency anemia, unspecified: Secondary | ICD-10-CM

## 2021-07-10 DIAGNOSIS — I10 Essential (primary) hypertension: Secondary | ICD-10-CM | POA: Diagnosis not present

## 2021-07-10 NOTE — Progress Notes (Signed)
Subjective:    Patient ID: Anita Mccormick, female    DOB: 1946-05-16, 75 y.o.   MRN: [id number]  Chief Complaint  Patient presents with   Follow-up    DM, anemia    HPI Patient is a 75 yo female with pmh sig for HTN, DM II, h/o L breast ca s/p mastectomy, cirrhosis, anemia, HFpEF who was seen today for follow-up.  Patient seen on 06/05/2021 for fatigue, SOB clinically sent to ED for symptomatic anemia, hemoglobin 7.1.  Patient admitted for observation for 11/10-05/2010, s/p 1 units PRBC and IV iron.  BNP also elevated at 683.  Pt states she is feeling better, but has her days.  Pt denies increased edema, SOB, fatigue. Has a callous on each foot that is becoming painful with walking.  Typically has callous paired at Podiatry.  Not really checking fsbs at home.    Past Medical History:  Diagnosis Date   Cancer [facility])    history of no adjunctive RX( no chemo no RT)   Cerebrovascular accident ([place])    [place], HX OF 12/10/2006   Qualifier: Diagnosis of  By: Leanne Chang MD, Bruce     Diabetes mellitus type II, controlled ([place]) 12/10/2006   Poor control on Januvia 100mg  and glipizide 10mg  XL Lab Results  Component Value Date   HGBA1C 8.3* 11/02/2014        Eczema    Fatty liver disease, nonalcoholic 67/20/9470   Per Dr. Leanne Chang Lab Results  Component Value Date   ALT 31 11/02/2014   AST 57* 11/02/2014   ALKPHOS 104 11/02/2014   BILITOT 1.1 11/02/2014       Hypertension     Allergies  Allergen Reactions   Ace Inhibitors Cough    ????   Chlorthalidone     REACTION: unspecified   Metformin     REACTION: gi side effects   Penicillins     REACTION: urticaria (hives)   Sulfamethoxazole     REACTION: questionable    ROS General: Denies fever, chills, night sweats, changes in weight, changes in appetite HEENT: Denies headaches, ear pain, changes in vision, rhinorrhea, sore throat CV: Denies CP, palpitations, SOB, orthopnea Pulm: Denies SOB, cough, wheezing GI: Denies abdominal pain,  nausea, vomiting, diarrhea, constipation GU: Denies dysuria, hematuria, frequency, vaginal discharge Msk: Denies muscle cramps, joint pains Neuro: Denies weakness, numbness, tingling Skin: Denies rashes, bruising  +callous on both feet Psych: Denies depression, anxiety, hallucinations  Objective:    Blood pressure 140/84, pulse 79, temperature 98.6 F (37 C), temperature source Oral, weight 150 lb 9.6 oz (68.3 kg), SpO2 97 %.  Gen. Pleasant, well-nourished, in no distress, normal affect   HEENT: [place]/AT, face symmetric, conjunctiva clear, no scleral icterus, PERRLA, EOMI, nares patent without drainage Lungs: no accessory muscle use, CTAB, no wheezes or rales Cardiovascular: RRR, no m/r/g, no peripheral edema Musculoskeletal: No deformities, no cyanosis or clubbing, normal tone Neuro:  A&Ox3, CN II-XII intact, normal gait Skin:  Warm, dry, intact.  Callus on L lateral 5th digit and R foot great toe, pared with 10 blade.  Wt Readings from Last 3 Encounters:  07/10/21 150 lb 9.6 oz (68.3 kg)  06/06/21 148 lb 14.4 oz (67.5 kg)  06/05/21 151 lb 9.6 oz (68.8 kg)    Lab Results  Component Value Date   WBC 6.1 06/07/2021   HGB 8.4 (L) 06/07/2021   HGB 8.6 (L) 06/07/2021   HCT 28.2 (L) 06/07/2021   HCT 28.2 (L) 06/07/2021   PLT  103 (L) 06/07/2021   GLUCOSE 145 (H) 06/07/2021   CHOL 146 03/18/2017   TRIG 154.0 (H) 03/18/2017   HDL 49.80 03/18/2017   LDLDIRECT 95.8 07/02/2006   LDLCALC 65 03/18/2017   ALT 14 06/07/2021   AST 22 06/07/2021   NA 138 06/07/2021   K 4.3 06/07/2021   CL 109 06/07/2021   CREATININE 1.11 (H) 06/07/2021   BUN 13 06/07/2021   CO2 25 06/07/2021   TSH 0.76 06/05/2021   INR 1.2 06/06/2021   HGBA1C 6.6 (H) 06/05/2021   MICROALBUR <0.7 06/08/2019    Assessment/Plan:  Iron deficiency anemia, unspecified iron deficiency anemia type  -likely multifactorial h/o CKD II, cirrhosis. Also consider a GI source. -Hgb 7.1 on 06/05/21. Increased to 8.4 s/p 1 unit  pRBC and IV iron -increase intake of iron rich foods and ferrous sulfate 325 mg daily. - Plan: Ambulatory referral to Gastroenterology, Reticulocytes, Iron, TIBC and Ferritin Panel, CBC with Differential/Platelets  Foot callus -causing pain on b/l feet.  -consent obtained.  Calluses pared in clinic.  Pt tolerated procedure well. -continue f/u with Podiatry  Chronic diastolic CHF (congestive heart failure) (HCC) -stable  -allergy to chlorthalidone -continue atenolol 100 mg BID -consider lasix daily with Kdur for increased symptoms  HTN -elevated -lifestyle modifications  -continue current meds  Type II diabetes mellitus with other diabetic kidney complication, without long term current use of insulin (HCC) -hgb A1C 6.6% on 06/05/21.  A1C likely lower 2/2 anemia. -continue glipizide XL 10 mg and trulicity 1.5 weekly. -in the past cost has been an issue with adjusting DM medications. -lifestyle modifications.  F/u 4 wks  [name], MD

## 2021-07-10 NOTE — Progress Notes (Signed)
Chronic Care Management Pharmacy Note  07/17/2021 Name:  Anita Mccormick MRN:  [id number] DOB:  Jan 15, 1946  Summary: A1c at goal < 7%  Recommendations/Changes made from today's visit: -Sent order in for [facility]  -Recommended decreasing aspirin therapy to 81 mg daily or stopping  Plan: Follow up DM/BP assessment in 2-3 months Follow up for in office instructions for libre   Subjective: Anita Mccormick is an 75 y.o. year old female who is a primary patient of Billie Ruddy, MD.  The CCM team was consulted for assistance with disease management and care coordination needs.    Engaged with patient face to face for follow up visit in response to provider referral for pharmacy case management and/or care coordination services.   Consent to Services:  The patient was given information about Chronic Care Management services, agreed to services, and gave verbal consent prior to initiation of services.  Please see initial visit note for detailed documentation.   Patient Care Team: Billie Ruddy, MD as PCP - General (Family Medicine) Martin[place], Peter M, MD as PCP - Cardiology (Cardiology) Warden Fillers, MD as Consulting Physician (Ophthalmology) Viona Gilmore, [facility] as Pharmacist (Pharmacist)  Recent office visits: 06/05/21 [name] MD: Patient presented for SOB. Recommended heading to ED given low hemoglobin.  03/07/21 [name] MD (Family Medicine) - seen for type 2 diabetes mellitus with other diabetic kidney complication and other issues. No medication changes. Follow up in 1 month.   Recent consult visits: 05/13/21 [name] DPM (Podiatry) - seen for Callouses. No medication changes. Follow up in 3 months.   03/05/21 Warden Fillers (ophthalmology): Patient presented for eye exam. Unable to access notes.  01/17/21 Celesta Gentile DPM (Podiatry) - presented to clinic for diabetes mellitus without complication and routine foot care. No medication  changes. Follow up in 3 months.    09/25/20 Courtney Forcucci Ollis PA-C (Gastrology) -  Seen for GERD and other chronic issues. Increased omeprazole to 45m daily. Follow up in 4 weeks.   Hospital visits: 11/10-11/11/22 [facility] Recommended taking Protonix as upper GI source cannot be ruled out. D/c'd potassium.  Objective:  Lab Results  Component Value Date   CREATININE 1.11 (H) 06/07/2021   BUN 13 06/07/2021   GFR 43.26 (L) 06/05/2021   GFRNONAA 52 (L) 06/07/2021   GFRAA >60 11/16/2017   NA 138 06/07/2021   K 4.3 06/07/2021   CALCIUM 8.8 (L) 06/07/2021   CO2 25 06/07/2021   GLUCOSE 145 (H) 06/07/2021    Lab Results  Component Value Date/Time   HGBA1C 6.6 (H) 06/05/2021 12:22 PM   HGBA1C 6.1 (A) 03/07/2021 03:18 PM   HGBA1C 6.5 (H) 05/10/2020 02:02 PM   GFR 43.26 (L) 06/05/2021 12:22 PM   GFR 50.10 (L) 03/07/2021 03:50 PM   MICROALBUR <0.7 06/08/2019 12:55 PM   MICROALBUR 0.2 04/11/2014 11:36 AM    Last diabetic Eye exam:  Lab Results  Component Value Date/Time   HMDIABEYEEXA No Retinopathy 01/14/2016 12:00 AM    Last diabetic Foot exam:  Lab Results  Component Value Date/Time   HMDIABFOOTEX done 07/28/2013 12:00 AM     Lab Results  Component Value Date   CHOL 146 03/18/2017   HDL 49.80 03/18/2017   LDLCALC 65 03/18/2017   LDLDIRECT 95.8 07/02/2006   TRIG 154.0 (H) 03/18/2017   CHOLHDL 3 03/18/2017    Hepatic Function Latest Ref Rng & Units 06/07/2021 06/06/2021 06/05/2021  Total Protein 6.5 - 8.1 g/dL 6.2(L) 7.0 7.0  Albumin 3.5 - 5.0 g/dL 3.0(L) 3.7 3.7  AST 15 - 41 U/L _0 ALT 0 - 44 U/L _1 Alk Phosphatase 38 - 126 U/L 74 92 88  Total Bilirubin 0.3 - 1.2 mg/dL 1.9(H) 0.9 0.9  Bilirubin, Direct 0.0 - 0.3 mg/dL - - -    Lab Results  Component Value Date/Time   TSH 0.76 06/05/2021 12:22 PM   TSH 1.32 04/11/2014 11:36 AM    CBC Latest Ref Rng & Units 07/11/2021 06/07/2021  06/07/2021  WBC 4.0 - 10.5 K/uL 4.6 6.1 -  Hemoglobin 12.0 - 15.0 g/dL 11.6(L) 8.4(L) 8.6(L)  Hematocrit 36.0 - 46.0 % 35.6(L) 28.2(L) 28.2(L)  Platelets 150.0 - 400.0 K/uL 98.0(L) 103(L) -    Lab Results  Component Value Date/Time   VD25OH 86.43 06/05/2021 12:22 PM   VD25OH CANCELED 05/10/2020 02:01 PM    Clinical ASCVD: Yes  The ASCVD Risk score (Arnett DK, et al., 2019) failed to calculate for the following reasons:   Cannot find a previous HDL lab   Cannot find a previous total cholesterol lab    Depression screen [facility] 2/9 03/18/2017 12/19/2015 09/14/2015  Decreased Interest 0 0 0  Down, Depressed, Hopeless 0 0 0  PHQ - 2 Score 0 0 0  Some recent data might be hidden      Social History   Tobacco Use  Smoking Status Every Day   Packs/day: 0.10   Years: 10.00   Pack years: 1.00   Types: Cigarettes  Smokeless Tobacco Never  Tobacco Comments   3 cigs a day :not smoking due to illness (01/17/17)   BP Readings from Last 3 Encounters:  07/10/21 140/84  06/07/21 (!) 164/68  06/05/21 (!) 142/66   Pulse Readings from Last 3 Encounters:  07/10/21 79  06/07/21 73  06/05/21 87   Wt Readings from Last 3 Encounters:  07/10/21 150 lb 9.6 oz (68.3 kg)  06/06/21 148 lb 14.4 oz (67.5 kg)  06/05/21 151 lb 9.6 oz (68.8 kg)   BMI Readings from Last 3 Encounters:  07/10/21 27.55 kg/m  06/06/21 27.23 kg/m  06/05/21 27.73 kg/m    Assessment/Interventions: Review of patient past medical history, allergies, medications, health status, including review of consultants reports, laboratory and other test data, was performed as part of comprehensive evaluation and provision of chronic care management services.   SDOH:  (Social Determinants of Health) assessments and interventions performed: Yes   SDOH Screenings   Alcohol Screen: Not on file  Depression (PHQ2-9): Not on file  Financial Resource Strain: Low Risk    Difficulty of Paying Living Expenses: Not very hard  Food  Insecurity: Not on file  Housing: Not on file  Physical Activity: Not on file  Social Connections: Not on file  Stress: Not on file  Tobacco Use: High Risk   Smoking Tobacco Use: Every Day   Smokeless Tobacco Use: Never   Passive Exposure: Not on file  Transportation Needs: No Transportation Needs   Lack of Transportation (Medical): No   Lack of Transportation (Non-Medical): No    CCM Care Plan  Allergies  Allergen Reactions   Ace Inhibitors Cough    ????   Chlorthalidone     REACTION: unspecified   Metformin     REACTION: gi side effects   Penicillins     REACTION: urticaria (hives)   Sulfamethoxazole     REACTION: questionable  Medications Reviewed Today     Reviewed by Anderson [place], [place] (Certified Medical Assistant) on 07/10/21 at 1333  Med List Status: <None>   Medication Order Taking? Sig Documenting Provider Last Dose Status Informant  aspirin 325 MG tablet [id number] Yes Take 325 mg by mouth at bedtime. Takes half pill daily [provider] Taking Active Self  atenolol (TENORMIN) 100 MG tablet [id number] Yes TAKE 1 TABLET BY MOUTH TWICE A DAY Billie Ruddy, MD Taking Active   blood glucose meter kit and supplies KIT [id number] Yes Dispense based on patient and insurance preference. Use up to four times daily as directed. Billie Ruddy, MD Taking Active   Blood Glucose Monitoring Suppl [facility] Karsten Fells) w/Device Drucie Opitz [id number] Yes Test twice daily (onetouch ultra) Billie Ruddy, MD Taking Active Self  Blood Pressure Monitoring (BLOOD PRESSURE CUFF) MISC [id number] Yes Use daily to monitor blood pressue Billie Ruddy, MD Taking Active Self  Calcium Carbonate-Vitamin D (CALCIUM PLUS VITAMIN D) 600-100 MG-UNIT CAPS [id number] Yes Take 1 capsule by mouth 2 (two) times daily. [provider] Taking Active Self  Continuous Blood Gluc Receiver (FREESTYLE LIBRE READER) DEVI [id number] Yes 1 Device by Does not apply route 4 (four) times daily as  needed. Billie Ruddy, MD Taking Active   Continuous Blood Gluc Sensor (FREESTYLE LIBRE 14 DAY SENSOR) [place] [id number] Yes 1 Device by Does not apply route every 14 (fourteen) days. Billie Ruddy, MD Taking Active   ferrous sulfate 325 (65 FE) MG tablet [id number] Yes Take 1 tablet (325 mg total) by mouth daily with breakfast. Nita Sells, MD Taking Active   furosemide (LASIX) 20 MG tablet [id number]  Take 1 tablet (20 mg total) by mouth daily for 3 days. Billie Ruddy, MD  Expired 03/11/21 2359   glipiZIDE (GLUCOTROL XL) 10 MG 24 hr tablet [id number] Yes TAKE 1 TABLET BY MOUTH TWICE A DAY Billie Ruddy, MD Taking Active   Lancets [facility] ULTRASOFT) lancets [id number] Yes Use as instructed Billie Ruddy, MD Taking Active   [facility] ULTRA test strip [id number] Yes USE AS INSTRUCTED. FOR 3 TIMES A DAY TESTING, MORE FOR HYPO OR HYPERGLYCEMIA. Billie Ruddy, MD Taking Active   pantoprazole (PROTONIX) 40 MG tablet [id number] Yes Take 1 tablet (40 mg total) by mouth daily. Nita Sells, MD Taking Active   TRULICITY 1.5 NL/9.7QB Bonney Aid [id number] Yes INJECT 1.5 MG INTO THE SKIN ONCE A WEEK. Billie Ruddy, MD Taking Active             Patient Active Problem List   Diagnosis Date Noted   Symptomatic anemia 06/06/2021   Hyperglycemia due to diabetes mellitus ([place]) 06/06/2021   Elevated brain natriuretic peptide (BNP) level 06/06/2021   Atherosclerosis of aorta ([place]) 05/18/2020   Cirrhosis of liver without ascites ([place]) 05/18/2020   Dysuria 02/20/2018   Anemia    Sepsis due to Escherichia coli (E. coli) ([place])    AKI (acute kidney injury) ([place])    Sepsis ([place]) 09/19/2017   UTI (urinary tract infection) 09/19/2017   Chronic kidney disease (CKD), stage II (mild) 09/19/2017   Leukopenia 09/19/2017   Chronic diastolic CHF (congestive heart failure) ([place]) 09/19/2017   Statins contraindicated 06/12/2017   Hepatotoxicity due to statin drug 06/12/2017   History of  adenomatous polyp of colon 03/18/2017   Viral URI with cough 01/17/2017   Thrombocytopenia ([place]) 12/19/2015   Insomnia 08/21/2015   Nicotine use disorder 05/21/2015   Eczema 05/21/2015   Fatty liver  disease, nonalcoholic 16/04/9603   Hyperlipidemia 11/03/2007   History of CVA (cerebrovascular accident) 10/20/2007   Type II diabetes mellitus with renal manifestations ([place]) 12/10/2006   Essential hypertension 12/10/2006   History of breast cancer 12/10/2006    Immunization History  Administered Date(s) Administered   Fluad Quad(high Dose 65+) 07/05/2020   Influenza Split 04/30/2012   Influenza Whole 06/01/2007, 04/27/2008, 06/05/2009, 05/02/2010, 04/28/2011   Influenza, High Dose Seasonal PF 03/14/2015, 04/04/2016, 04/30/2017, 04/30/2018, 05/04/2019   Influenza,inj,Quad PF,6+ Mos 03/23/2013   Influenza-Unspecified 04/11/2014   PFIZER(Purple Top)SARS-COV-2 Vaccination 09/25/2019, 10/25/2019, 05/12/2020, 03/11/2021   Pneumococcal Conjugate-13 06/30/2014   Pneumococcal Polysaccharide-23 03/21/2009, 08/21/2015   Tdap 09/22/2011   Zoster, Live 03/19/2011   Patient reports she is eating a lot more food with iron lately including raw spinach and leafy greens and beans. Her and her daughter had a lot of questions as to how this happened and how to correct it.  Patient has not been checking her blood sugars since she was discharged from the hospital because her fingers are hurting.  Conditions to be addressed/monitored:  Hypertension, Hyperlipidemia, Diabetes, Heart Failure, GERD, and Chronic Kidney Disease  Conditions addressed this visit: Anemia, diabetes  Care Plan : CCM Pharmacy Care Plan  Updates made by Viona Gilmore, [place] since 07/17/2021 12:00 AM     Problem: Problem: Hypertension, Hyperlipidemia, Diabetes, Heart Failure, GERD, and Chronic Kidney Disease      Long-Range Goal: Patient-Specific Goal   Start Date: 02/27/2021  Expected End Date: 02/27/2022  Recent Progress: On  track  Priority: High  Note:   Current Barriers:  Unable to independently monitor therapeutic efficacy  Pharmacist Clinical Goal(s):  Patient will achieve adherence to monitoring guidelines and medication adherence to achieve therapeutic efficacy through collaboration with PharmD and provider.   Interventions: 1:1 collaboration with Billie Ruddy, MD regarding development and update of comprehensive plan of care as evidenced by provider attestation and co-signature Inter-disciplinary care team collaboration (see longitudinal plan of care) Comprehensive medication review performed; medication list updated in electronic medical record  Hypertension (BP goal <140/90) -Controlled -Current treatment: Atenolol 100 mg 1 tablet twice daily Furosemide 20 mg as needed -Medications previously tried: chlorthalidone, ACE inhibitor  -Current home readings: 112/64 (checking at the drug store once a week) -Current dietary habits: limits salt intake -Current exercise habits: no structured exercise -Denies hypotensive/hypertensive symptoms -Educated on BP goals and benefits of medications for prevention of heart attack, stroke and kidney damage; Importance of home blood pressure monitoring; Proper BP monitoring technique; -Counseled to monitor BP at home weekly, document, and provide log at future appointments -Counseled on diet and exercise extensively Recommended to continue current medication  Hyperlipidemia: (LDL goal < 70) -Controlled -Current treatment: No medications -Medications previously tried: statins (elevated LFTs)  -Current dietary patterns: did not discuss -Current exercise habits: no formal exercise -Educated on Cholesterol goals;  Exercise goal of 150 minutes per week; -Counseled on diet and exercise extensively Recommended repeat lipid panel  Diabetes (A1c goal <7%) -Controlled -Current medications: Trulicity 1.5 inject 1.5 mg once a week Glipizide XL 10 mg 1 tablet  twice a daily  -Medications previously tried: metformin (diarrhea)  -Current home glucose readings fasting glucose: 200; 160 or lower; 124, 130  post prandial glucose: not much -Denies hypoglycemic/hyperglycemic symptoms -Current meal patterns:  breakfast: grapefruits with oatmeal, dried raisins and cranberries ([employer]), sausage lunch: salad, chicken salad with crackers  dinner: pasta and shrimp or salmon with vegetable snacks: ritz crackers or potato chips; corn  chips; donut; sugarless drinks drinks: water; diet soda -Current exercise: no structured exercise -Educated on A1c and blood sugar goals; Complications of diabetes including kidney damage, retinal damage, and cardiovascular disease; Exercise goal of 150 minutes per week; Carbohydrate counting and/or plate method -Counseled to check feet daily and get yearly eye exams -Counseled on diet and exercise extensively Recommended to continue current medication Collaborated with PCP to send in order for [employer].  Heart Failure (Goal: manage symptoms and prevent exacerbations) -Not ideally controlled -Last ejection fraction: 60-65% (Date: 08/11/2017) -HF type: Diastolic -NYHA Class: II (slight limitation of activity) -AHA HF Stage: B (Heart disease present - no symptoms present) -Current treatment: Furosemide 20 mg 1 tablet every other day as needed Potassium chloride 20 mEq tablet daily -Medications previously tried: none  -Current home BP/HR readings: refer to above -Current dietary habits: limits salt intake -Current exercise habits: minimal -Educated on Importance of weighing daily; if you gain more than 3 pounds in one day or 5 pounds in one week, call PCP Proper diuretic administration and potassium supplementation -Counseled on diet and exercise extensively Recommended to continue current medication  GERD (Goal: minimize symptoms) -Controlled -Current treatment  Omeprazole 20 mg daily -Medications  previously tried: none  - Reassess the need for medication at follow up.  Neuropathy (Goal: minimize nerve pain/tingling) -Controlled -Current treatment  Gabapentin 100 mg daily -Medications previously tried: none  -Recommended to continue current medication  History of CVA (Goal: prevent future strokes) -Not ideally controlled -Current treatment  Aspirin 325 mg 1 tablet daily  -Medications previously tried: none  -Recommended decreasing to aspirin 81 mg daily or stopping.   Health Maintenance -Vaccine gaps: shingrix -Current therapy:  Calcium carbonate - 20 mcg vitamin D once daily -Educated on Cost vs benefit of each product must be carefully weighed by individual consumer -Patient is satisfied with current therapy and denies issues -Recommended to continue as is  Patient Goals/Self-Care Activities Patient will:  - take medications as prescribed check glucose daily, document, and provide at future appointments check blood pressure weekly, document, and provide at future appointments target a minimum of 150 minutes of moderate intensity exercise weekly  Follow Up Plan: The care management team will reach out to the patient again over the next 30 days.          Medication Assistance: None required.  Patient affirms current coverage meets needs.  Compliance/Adherence/Medication fill history: Care Gaps: Shingrix, eye exam, foot exam, COVID booster, A1c, influenza vaccine Last BP - 142/66 on 11/09/222 Last A1C -  6.6 on 06/05/2021  Star-Rating Drugs: Glipizide 80m - [id number]- filled by patient assistance  Patient's [facility] pharmacy [facility]  [id number] [place], [id number] [id number]: [phone]: [phone] Uses pill box? Yes - weekly pill box Pt endorses 99% compliance - once in the last few months  We  discussed: Current pharmacy is preferred with insurance plan and patient is satisfied with pharmacy services Patient decided to: Continue current medication management strategy  Care Plan and Follow Up Patient Decision:  Patient agrees to Care Plan and Follow-up.  Plan: Telephone follow up appointment with care management team member scheduled for:  6 months  MaJeni SallesPharmD, BCChewtonharmacist [id number] [phone]

## 2021-07-11 ENCOUNTER — Other Ambulatory Visit (INDEPENDENT_AMBULATORY_CARE_PROVIDER_SITE_OTHER): Payer: Medicare HMO

## 2021-07-11 DIAGNOSIS — D509 Iron deficiency anemia, unspecified: Secondary | ICD-10-CM

## 2021-07-11 LAB — CBC WITH DIFFERENTIAL/PLATELET
Basophils Absolute: 0.1 10*3/uL (ref 0.0–0.1)
Basophils Relative: 1.4 % (ref 0.0–3.0)
Eosinophils Absolute: 1.5 10*3/uL — ABNORMAL HIGH (ref 0.0–0.7)
Eosinophils Relative: 32.2 % — ABNORMAL HIGH (ref 0.0–5.0)
HCT: 35.6 % — ABNORMAL LOW (ref 36.0–46.0)
Hemoglobin: 11.6 g/dL — ABNORMAL LOW (ref 12.0–15.0)
Lymphocytes Relative: 14.2 % (ref 12.0–46.0)
Lymphs Abs: 0.7 10*3/uL (ref 0.7–4.0)
MCHC: 32.6 g/dL (ref 30.0–36.0)
MCV: 97 fl (ref 78.0–100.0)
Monocytes Absolute: 0.4 10*3/uL (ref 0.1–1.0)
Monocytes Relative: 7.7 % (ref 3.0–12.0)
Neutro Abs: 2.1 10*3/uL (ref 1.4–7.7)
Neutrophils Relative %: 44.5 % (ref 43.0–77.0)
Platelets: 98 10*3/uL — ABNORMAL LOW (ref 150.0–400.0)
RBC: 3.67 Mil/uL — ABNORMAL LOW (ref 3.87–5.11)
RDW: 26.5 % — ABNORMAL HIGH (ref 11.5–15.5)
WBC: 4.6 10*3/uL (ref 4.0–10.5)

## 2021-07-12 LAB — IRON,TIBC AND FERRITIN PANEL
%SAT: 62 % (calc) — ABNORMAL HIGH (ref 16–45)
Ferritin: 73 ng/mL (ref 16–288)
Iron: 211 ug/dL — ABNORMAL HIGH (ref 45–160)
TIBC: 342 mcg/dL (calc) (ref 250–450)

## 2021-07-12 LAB — RETICULOCYTES
ABS Retic: 100240 cells/uL — ABNORMAL HIGH (ref 20000–80000)
Retic Ct Pct: 2.8 %

## 2021-07-24 ENCOUNTER — Other Ambulatory Visit: Payer: Self-pay | Admitting: Family Medicine

## 2021-07-24 DIAGNOSIS — D649 Anemia, unspecified: Secondary | ICD-10-CM

## 2021-07-25 ENCOUNTER — Telehealth: Payer: Self-pay | Admitting: Hematology

## 2021-07-25 ENCOUNTER — Encounter: Payer: Self-pay | Admitting: Family Medicine

## 2021-07-25 NOTE — Telephone Encounter (Signed)
Scheduled appt per 12/28 referral. Pt is aware of appt date and time.  °

## 2021-07-27 DIAGNOSIS — E1159 Type 2 diabetes mellitus with other circulatory complications: Secondary | ICD-10-CM

## 2021-07-27 DIAGNOSIS — I509 Heart failure, unspecified: Secondary | ICD-10-CM

## 2021-07-27 DIAGNOSIS — I110 Hypertensive heart disease with heart failure: Secondary | ICD-10-CM

## 2021-07-27 DIAGNOSIS — E785 Hyperlipidemia, unspecified: Secondary | ICD-10-CM | POA: Diagnosis not present

## 2021-07-27 DIAGNOSIS — Z794 Long term (current) use of insulin: Secondary | ICD-10-CM | POA: Diagnosis not present

## 2021-07-28 ENCOUNTER — Encounter: Payer: Self-pay | Admitting: Family Medicine

## 2021-07-28 DIAGNOSIS — Z87442 Personal history of urinary calculi: Secondary | ICD-10-CM

## 2021-07-28 HISTORY — DX: Personal history of urinary calculi: Z87.442

## 2021-08-07 ENCOUNTER — Inpatient Hospital Stay: Payer: Medicare HMO

## 2021-08-07 ENCOUNTER — Inpatient Hospital Stay: Payer: Medicare HMO | Attending: Hematology | Admitting: Hematology

## 2021-08-07 ENCOUNTER — Other Ambulatory Visit: Payer: Self-pay

## 2021-08-07 DIAGNOSIS — D649 Anemia, unspecified: Secondary | ICD-10-CM

## 2021-08-07 DIAGNOSIS — I10 Essential (primary) hypertension: Secondary | ICD-10-CM | POA: Diagnosis not present

## 2021-08-07 DIAGNOSIS — D509 Iron deficiency anemia, unspecified: Secondary | ICD-10-CM

## 2021-08-07 DIAGNOSIS — E119 Type 2 diabetes mellitus without complications: Secondary | ICD-10-CM | POA: Diagnosis not present

## 2021-08-07 DIAGNOSIS — R109 Unspecified abdominal pain: Secondary | ICD-10-CM

## 2021-08-07 DIAGNOSIS — D696 Thrombocytopenia, unspecified: Secondary | ICD-10-CM

## 2021-08-07 LAB — CBC WITH DIFFERENTIAL/PLATELET
Abs Immature Granulocytes: 0.01 10*3/uL (ref 0.00–0.07)
Basophils Absolute: 0 10*3/uL (ref 0.0–0.1)
Basophils Relative: 1 %
Eosinophils Absolute: 1.8 10*3/uL — ABNORMAL HIGH (ref 0.0–0.5)
Eosinophils Relative: 32 %
HCT: 38.3 % (ref 36.0–46.0)
Hemoglobin: 12.5 g/dL (ref 12.0–15.0)
Immature Granulocytes: 0 %
Lymphocytes Relative: 15 %
Lymphs Abs: 0.9 10*3/uL (ref 0.7–4.0)
MCH: 33.2 pg (ref 26.0–34.0)
MCHC: 32.6 g/dL (ref 30.0–36.0)
MCV: 101.6 fL — ABNORMAL HIGH (ref 80.0–100.0)
Monocytes Absolute: 0.5 10*3/uL (ref 0.1–1.0)
Monocytes Relative: 9 %
Neutro Abs: 2.4 10*3/uL (ref 1.7–7.7)
Neutrophils Relative %: 43 %
Platelets: 123 10*3/uL — ABNORMAL LOW (ref 150–400)
RBC: 3.77 MIL/uL — ABNORMAL LOW (ref 3.87–5.11)
RDW: 17.6 % — ABNORMAL HIGH (ref 11.5–15.5)
WBC: 5.6 10*3/uL (ref 4.0–10.5)
nRBC: 0 % (ref 0.0–0.2)

## 2021-08-07 LAB — VITAMIN B12: Vitamin B-12: 250 pg/mL (ref 180–914)

## 2021-08-07 LAB — FERRITIN: Ferritin: 107 ng/mL (ref 11–307)

## 2021-08-07 LAB — SAMPLE TO BLOOD BANK

## 2021-08-07 LAB — IRON AND IRON BINDING CAPACITY (CC-WL,HP ONLY)
Iron: 84 ug/dL (ref 28–170)
Saturation Ratios: 24 % (ref 10.4–31.8)
TIBC: 346 ug/dL (ref 250–450)
UIBC: 262 ug/dL (ref 148–442)

## 2021-08-07 LAB — IMMATURE PLATELET FRACTION: Immature Platelet Fraction: 6.2 % (ref 1.2–8.6)

## 2021-08-07 NOTE — Progress Notes (Addendum)
[name]   HEMATOLOGY/ONCOLOGY CONSULTATION NOTE  Date of Service: .08/07/2021   Patient Care Team: Billie Ruddy, MD as PCP - General (Family Medicine) Martinique, Peter M, MD as PCP - Cardiology (Cardiology) Warden Fillers, MD as Consulting Physician (Ophthalmology) Viona Gilmore, [facility] as Pharmacist (Pharmacist)  CHIEF COMPLAINTS/PURPOSE OF CONSULTATION:  Anemia  HISTORY OF PRESENTING ILLNESS:   Anita Mccormick is a wonderful 76 y.o. female who has been referred to [place] by Dr .Volanda Napoleon, Langley Adie, MD for evaluation and management of Anemia.  Patient has a history of hypertension, diabetes type 2, remote history of left-sided breast cancer status postmastectomy (no adjuvant radiation or chemotherapy), fatty liver possible liver cirrhosis, chronic kidney disease stage II, diastolic CHF who recently was admitted to hospital with symptomatic anemia.  She was noted to have dyspnea on exertion and generalized weakness and presented with a hemoglobin of 7.1 She received 1 unit of PRBC and 1 dose of IV iron. Hemoglobin on discharge was 8.6 and she was recommended to have an outpatient GI work-up. She was noted to have she was placed on a PPI for possible upper GI bleeding. She was seen by her primary care physician for follow-up on 07/10/2021 and has been referred to gastroenterology for evaluation of possible GI bleeding. Labs done on 07/11/2021 showed her hemoglobin had improved to 11.6 with an MCV of 97, normal WBC count of 4.6k platelets of 98k. Ferritin was up to 73 from a previous level of 7. Iron saturation was up to 62% from previous level of 4%.  Patient notes no acute weight loss.  Notes no overt visible evidence of GI bleeding or melena. No acute new abdominal pain. No other overt source of bleeding.  No hematuria no epistaxis no gum bleeding.  Patient does have calluses on her feet and is following with podiatry to manage these.    MEDICAL HISTORY:  Past Medical History:   Diagnosis Date   Cancer [facility])    history of no adjunctive RX( no chemo no RT)   Cerebrovascular accident ([place])    [place], HX OF 12/10/2006   Qualifier: Diagnosis of  By: Leanne Chang MD, Bruce     Diabetes mellitus type II, controlled ([place]) 12/10/2006   Poor control on Januvia 151m and glipizide 150mXL Lab Results  Component Value Date   HGBA1C 8.3* 11/02/2014        Eczema    Fatty liver disease, nonalcoholic 1274/02/1447 Per Dr. SwLeanne Changab Results  Component Value Date   ALT 31 11/02/2014   AST 57* 11/02/2014   ALKPHOS 104 11/02/2014   BILITOT 1.1 11/02/2014       Hypertension     SURGICAL HISTORY: Past Surgical History:  Procedure Laterality Date   CATARACT EXTRACTION Left    MASTECTOMY Left    TUBAL LIGATION      SOCIAL HISTORY: Social History   Socioeconomic History   Marital status: Married    Spouse name: Not on file   Number of children: Not on file   Years of education: Not on file   Highest education level: Not on file  Occupational History   Not on file  Tobacco Use   Smoking status: Every Day    Packs/day: 0.10    Years: 10.00    Pack years: 1.00    Types: Cigarettes   Smokeless tobacco: Never   Tobacco comments:    3 cigs a day :not smoking due to illness (01/17/17)  Vaping Use  Vaping Use: Never used  Substance and Sexual Activity   Alcohol use: Yes    Alcohol/week: 0.0 standard drinks    Comment: a drink every now and again   Drug use: No   Sexual activity: Not Currently  Other Topics Concern   Not on file  Social History Narrative   Married (husband Lynann Bologna). 4 children. 11 grandkids. 1 greatgrandchild.       Still working as Radiation protection practitioner: playing cards, board games, cooking, Air traffic controller   Social Determinants of Radio broadcast assistant Strain: Low Risk    Difficulty of Paying Living Expenses: Not very hard  Food Insecurity: Not on file  Transportation Needs: No Transportation Needs   Lack of Transportation (Medical):  No   Lack of Transportation (Non-Medical): No  Physical Activity: Not on file  Stress: Not on file  Social Connections: Not on file  Intimate Partner Violence: Not on file    FAMILY HISTORY: Family History  Problem Relation Age of Onset   Stroke Mother    Cancer Father        prostate   Diabetes Sister    Pancreatic cancer Sister     ALLERGIES:  is allergic to ace inhibitors, chlorthalidone, metformin, penicillins, and sulfamethoxazole.  MEDICATIONS:  Current Outpatient Medications  Medication Sig Dispense Refill   atenolol (TENORMIN) 100 MG tablet TAKE 1 TABLET BY MOUTH TWICE A DAY 180 tablet 1   blood glucose meter kit and supplies KIT Dispense based on patient and insurance preference. Use up to four times daily as directed. 1 each 0   Blood Glucose Monitoring Suppl (ONETOUCH ULTRALINK) w/Device KIT Test twice daily (onetouch ultra) 1 each 0   Blood Pressure Monitoring (BLOOD PRESSURE CUFF) MISC Use daily to monitor blood pressue 1 each 0   Calcium Carbonate-Vitamin D (CALCIUM PLUS VITAMIN D) 600-100 MG-UNIT CAPS Take 1 capsule by mouth 2 (two) times daily.     Continuous Blood Gluc Receiver (FREESTYLE LIBRE READER) DEVI 1 Device by Does not apply route 4 (four) times daily as needed. 1 each 0   Continuous Blood Gluc Sensor (FREESTYLE LIBRE 14 DAY SENSOR) MISC 1 Device by Does not apply route every 14 (fourteen) days. 2 each 5   ferrous sulfate 325 (65 FE) MG tablet Take 1 tablet (325 mg total) by mouth daily with breakfast. 30 tablet 3   furosemide (LASIX) 20 MG tablet Take 1 tablet (20 mg total) by mouth daily for 3 days. 3 tablet 0   glipiZIDE (GLUCOTROL XL) 10 MG 24 hr tablet TAKE 1 TABLET BY MOUTH TWICE A DAY 180 tablet 1   Lancets (ONETOUCH ULTRASOFT) lancets Use as instructed 100 each 12   ONETOUCH ULTRA test strip USE AS INSTRUCTED. FOR 3 TIMES A DAY TESTING, MORE FOR HYPO OR HYPERGLYCEMIA. 100 strip 1   pantoprazole (PROTONIX) 40 MG tablet Take 1 tablet (40 mg total) by  mouth daily. 30 tablet 1   TRULICITY 1.5 BD/5.3GD SOPN INJECT 1.5 MG INTO THE SKIN ONCE A WEEK. 2 mL 9   No current facility-administered medications for this visit.    REVIEW OF SYSTEMS:    .10 Point review of Systems was done is negative except as noted above.   PHYSICAL EXAMINATION: ECOG PERFORMANCE STATUS: 2 - Symptomatic, <50% confined to bed  Vital signs reviewed stable GENERAL:alert, in no acute distress and comfortable SKIN: no acute rashes, no significant lesions EYES: conjunctiva are pink and non-injected, sclera anicteric OROPHARYNX: MMM, no  exudates, no oropharyngeal erythema or ulceration NECK: supple, no JVD LYMPH:  no palpable lymphadenopathy in the cervical, axillary or inguinal regions LUNGS: clear to auscultation b/l with normal respiratory effort HEART: regular rate & rhythm ABDOMEN:  normoactive bowel sounds , non tender, not distended. Extremity: no pedal edema PSYCH: alert & oriented x 3 with fluent speech NEURO: no focal motor/sensory deficits  LABORATORY DATA:  I have reviewed the data as listed  . CBC Latest Ref Rng & Units 07/11/2021 06/07/2021 06/07/2021  WBC 4.0 - 10.5 K/uL 4.6 6.1 -  Hemoglobin 12.0 - 15.0 g/dL 11.6(L) 8.4(L) 8.6(L)  Hematocrit 36.0 - 46.0 % 35.6(L) 28.2(L) 28.2(L)  Platelets 150.0 - 400.0 K/uL 98.0(L) 103(L) -    . CMP Latest Ref Rng & Units 06/07/2021 06/06/2021 06/05/2021  Glucose 70 - 99 mg/dL 145(H) 323(H) 219(H)  BUN 8 - 23 mg/dL 13 17 18   Creatinine 0.44 - 1.00 mg/dL 1.11(H) 1.24(H) 1.23(H)  Sodium 135 - 145 mmol/L 138 136 137  Potassium 3.5 - 5.1 mmol/L 4.3 4.8 4.6  Chloride 98 - 111 mmol/L 109 104 104  CO2 22 - 32 mmol/L 25 26 27   Calcium 8.9 - 10.3 mg/dL 8.8(L) 9.5 9.5  Total Protein 6.5 - 8.1 g/dL 6.2(L) 7.0 7.0  Total Bilirubin 0.3 - 1.2 mg/dL 1.9(H) 0.9 0.9  Alkaline Phos 38 - 126 U/L 74 92 88  AST 15 - 41 U/L 22 19 22   ALT 0 - 44 U/L 14 10 12      RADIOGRAPHIC STUDIES: I have personally reviewed the  radiological images as listed and agreed with the findings in the report. No results found.  ASSESSMENT & PLAN:   76 year old female with hypertension, dyslipidemia, diabetes, CHF with preserved ejection fraction, fatty liver with possible liver cirrhosis remote history of breast cancer status postmastectomy with no history of previous chemotherapy or radiation with.  1) Microcytic anemia due to significant iron deficiency Patient was admitted to the hospital in early November ,2022 with a hemoglobin of 7.1.  With a ferritin of 7 and iron saturation of 4%. She received 1 unit of PRBC and 1 dose of IV iron. Her recent hemoglobin levels admitted December have improved to 11.6 with an improvement in her ferritin iron saturation.  2) thrombocytopenia-chronic possible liver cirrhosis.  Possible other nutritional deficiencies. PLAN -Discussed available lab results with the patient.  Her recent labs with her primary care physician in mid December have significantly improved after IV iron replacement. -We shall recheck her labs today and see if she needs additional IV iron. -She has been given a GI referral by her primary care physician and should follow-up for a GI work-up to evaluate the reason of her iron deficiency.  Will likely need EGD plus or minus colonoscopy. -Will send out work-up as noted below to evaluate for other associated reasons of anemia. -Given her mild CKD and potential chronic liver disease might need more aggressive iron replacement to keep her ferritin more than 100 and iron saturation more than 20%. -Patient notes history of chronic arthritis and notes that she might have rheumatoid arthritis is is not sure -we will send rheumatoid arthritis testing panel. -We will get ultrasound of the abdomen to evaluate for liver cirrhosis in the context of her anemia and chronic thrombocytopenia.  GI will be evaluating her and will need to evaluate and manage her chronic liver disease as  well. -She notes remote history of breast cancer but clearly reports that she has not had any radiation  or chemotherapy that might bring up risk of treatment-related MDS. -Which she will discuss her labs and additional follow-up plan by phone visit in about 1 to 2 weeks  3) . Patient Active Problem List   Diagnosis Date Noted   Symptomatic anemia 06/06/2021   Hyperglycemia due to diabetes mellitus ([place]) 06/06/2021   Elevated brain natriuretic peptide (BNP) level 06/06/2021   Atherosclerosis of aorta ([place]) 05/18/2020   Cirrhosis of liver without ascites ([place]) 05/18/2020   Dysuria 02/20/2018   Anemia    Sepsis due to Escherichia coli (E. coli) ([place])    AKI (acute kidney injury) ([place])    Sepsis ([place]) 09/19/2017   UTI (urinary tract infection) 09/19/2017   Chronic kidney disease (CKD), stage II (mild) 09/19/2017   Leukopenia 09/19/2017   Chronic diastolic CHF (congestive heart failure) ([place]) 09/19/2017   Statins contraindicated 06/12/2017   Hepatotoxicity due to statin drug 06/12/2017   History of adenomatous polyp of colon 03/18/2017   Viral URI with cough 01/17/2017   Thrombocytopenia ([place]) 12/19/2015   Insomnia 08/21/2015   Nicotine use disorder 05/21/2015   Eczema 05/21/2015   Fatty liver disease, nonalcoholic 54/65/0354   Hyperlipidemia 11/03/2007   History of CVA (cerebrovascular accident) 10/20/2007   Type II diabetes mellitus with renal manifestations ([place]) 12/10/2006   Essential hypertension 12/10/2006   History of breast cancer 12/10/2006  -Continue follow-up with primary care physician for management of other medical comorbidities. . Orders Placed This Encounter  Procedures   US Abdomen Complete    Standing Status:   Future    Number of Occurrences:   1    Standing Expiration Date:   08/07/2022    Order Specific Question:   Reason for Exam (SYMPTOM  OR DIAGNOSIS REQUIRED)    Answer:   Please evaluate for liver cirrhosis and splenomegaly. Patient with thrombocytopenia  and LUQ discomfort    Order Specific Question:   Preferred imaging location?    Answer:   [facility]   CBC with Differential/Platelet    Standing Status:   Future    Number of Occurrences:   1    Standing Expiration Date:   08/07/2022   Immature Platelet Fraction    Standing Status:   Future    Number of Occurrences:   1    Standing Expiration Date:   08/07/2022   Ferritin    Standing Status:   Future    Number of Occurrences:   1    Standing Expiration Date:   08/07/2022   Rheumatoid Arthritis Profile    Standing Status:   Future    Number of Occurrences:   1    Standing Expiration Date:   08/07/2022   Vitamin B12    Standing Status:   Future    Number of Occurrences:   1    Standing Expiration Date:   08/07/2022   Folate RBC    Standing Status:   Future    Number of Occurrences:   1    Standing Expiration Date:   08/07/2022   Sample to Blood Bank    Standing Status:   Future    Number of Occurrences:   1    Standing Expiration Date:   08/07/2022     Follow-up  Labs today [place] abd in 3-4 days Phone visit with Dr Irene Limbo in 7-10 days   All of the patients questions were answered with apparent satisfaction. The patient knows to call the clinic with any problems, questions or concerns.  I spent 35  mins counseling the patient face to face. The total time spent in the appointment was 50 mins  and more than 50% was on counseling and direct patient cares.    Sullivan Lone MD [place] AAHIVMS [facility] Hematology/Oncology Physician [facility]  (Office):       [phone] (Work cell):  [phone] (Fax):           [phone]  08/07/2021 11:26 AM

## 2021-08-08 ENCOUNTER — Telehealth: Payer: Self-pay | Admitting: Hematology

## 2021-08-08 ENCOUNTER — Encounter: Payer: Self-pay | Admitting: Hematology

## 2021-08-08 ENCOUNTER — Telehealth: Payer: Self-pay | Admitting: Family Medicine

## 2021-08-08 LAB — RHEUMATOID ARTHRITIS PROFILE
CCP Antibodies IgG/IgA: 5 units (ref 0–19)
Rheumatoid fact SerPl-aCnc: <10 IU/mL (ref <14.0)

## 2021-08-08 LAB — FOLATE RBC
Folate, Hemolysate: 468 ng/mL
Folate, RBC: 1289 ng/mL (ref >498)
Hematocrit: 36.3 % (ref 34.0–46.6)

## 2021-08-08 NOTE — Telephone Encounter (Signed)
Left message for patient to call back and schedule Medicare Annual Wellness Visit (AWV) either virtually or in office. Left  my Herbie Drape number [phone]   Last AWV 06/30/14  please schedule at anytime with [facility] Nurse Health Advisor 1 or 2   This should be a 45 minute visit.

## 2021-08-08 NOTE — Telephone Encounter (Signed)
Rescheduled appointment per 1/11 scheduling message. Unable to leave voicemail due to mailbox being full. Patient will be mailed an updated calendar.

## 2021-08-09 ENCOUNTER — Other Ambulatory Visit: Payer: Self-pay | Admitting: Family Medicine

## 2021-08-13 ENCOUNTER — Ambulatory Visit: Payer: Medicare HMO

## 2021-08-13 ENCOUNTER — Other Ambulatory Visit: Payer: Self-pay

## 2021-08-13 ENCOUNTER — Ambulatory Visit: Payer: Medicare HMO | Admitting: Podiatry

## 2021-08-13 DIAGNOSIS — M2142 Flat foot [pes planus] (acquired), left foot: Secondary | ICD-10-CM

## 2021-08-13 DIAGNOSIS — B351 Tinea unguium: Secondary | ICD-10-CM | POA: Diagnosis not present

## 2021-08-13 DIAGNOSIS — M79675 Pain in left toe(s): Secondary | ICD-10-CM

## 2021-08-13 DIAGNOSIS — M79674 Pain in right toe(s): Secondary | ICD-10-CM | POA: Diagnosis not present

## 2021-08-13 DIAGNOSIS — E1149 Type 2 diabetes mellitus with other diabetic neurological complication: Secondary | ICD-10-CM

## 2021-08-13 DIAGNOSIS — E119 Type 2 diabetes mellitus without complications: Secondary | ICD-10-CM

## 2021-08-13 DIAGNOSIS — Q828 Other specified congenital malformations of skin: Secondary | ICD-10-CM | POA: Diagnosis not present

## 2021-08-13 DIAGNOSIS — M2141 Flat foot [pes planus] (acquired), right foot: Secondary | ICD-10-CM

## 2021-08-13 NOTE — Progress Notes (Signed)
Subjective: 76 y.o. returns the office today for painful, elongated, thickened toenails which she cannot trim herself.   Denies any open lesions but the calluses are being painful most of the left fifth toe is very tender as the calluses gotten very thick.  She has not seen any swelling or redness.  No drainage.  She is interested in diabetic shoes.  No swelling, redness, drainage she has noticed.   PCP: Billie Ruddy, MD Last seen 07/10/2021, scheduled to see tomorrow. Last A1c was 6.6 on June 05, 2021  Objective: AAO 3, NAD DP/PT pulses palpable, CRT less than 3 seconds Sensation appears to be intact if symptomatic, but she does describe numbness and tingling to her feet consistent with neuropathy Nails hypertrophic, dystrophic, elongated, brittle, discolored 10. There is tenderness overlying the nails 1-5 bilaterally. There is no surrounding erythema or drainage along the nail sites. Hyperkeratotic lesion plantar right foot  medial hallux and right medial heel, left fifth toe which has been no significant without any underlying ulceration, drainage or signs of infection. No significant hyperkeratotic tissue to the digits.  Hammertoes evident. No pain with calf compression, swelling, warmth, erythema.  Assessment: Patient presents with symptomatic onychomycosis; hyperkeratotic lesion  Plan: -Treatment options including alternatives, risks, complications were discussed -Nails sharply debrided 10 without complication/bleeding. -Hyperkeratotic lesion sharply debrided x4 without any complications or bleeding. -Discussed daily foot inspection. If there are any changes, to call the office immediately.  -Follow-up with Aaron Edelman, orthotist, for diabetic shoes. -Follow-up as scheduled or sooner if any problems are to arise. In the meantime, encouraged to call the office with any questions, concerns, changes symptoms.  Celesta Gentile, DPM

## 2021-08-13 NOTE — Progress Notes (Signed)
SITUATION Reason for Consult: Evaluation for Prefabricated Diabetic Shoes and Bilateral Custom Diabetic Inserts. Patient / Caregiver Report: Patient would like well fitting shoes  OBJECTIVE DATA: Patient History / Diagnosis:    ICD-10-CM   1. Diabetes mellitus without complication (HCC)  A83.4     2. Flat foot (pes planus) (acquired), left foot  M21.42     3. Flat foot (pes planus) (acquired), right foot  M21.41       Current or Previous Devices:   None and no history  In-Person Foot Examination: Ulcers & Callousing:   Callousing on lateral 5th toes  Toe / Foot Deformities:   - Pes Planus, hammertoes   Shoe Size: 8W  ORTHOTIC RECOMMENDATION Recommended Devices: - 1x pair prefabricated PDAC approved diabetic shoes: [id number] [id number] - 3x pair custom-to-patient vacuum formed diabetic insoles.   GOALS OF SHOES AND INSOLES - Reduce shear and pressure - Reduce / Prevent callus formation - Reduce / Prevent ulceration - Protect the fragile healing compromised diabetic foot.  Patient would benefit from diabetic shoes and inserts as patient has diabetes mellitus and the patient has one or more of the following conditions: - History of partial or complete amputation of the foot - History of previous foot ulceration. - History of pre-ulcerative callus - Peripheral neuropathy with evidence of callus formation - Foot deformity - Poor circulation  ACTIONS PERFORMED Patient was casted for insoles via crush box and measured for shoes via brannock device. Procedure was explained and patient tolerated procedure well. All questions were answered and concerns addressed.  PLAN Patient is to ensure treating physician receives and completes diabetic paperwork. Casts and shoe order are to be held until paperwork is received. Once received patient is to be scheduled for fitting in four weeks.

## 2021-08-14 ENCOUNTER — Ambulatory Visit (INDEPENDENT_AMBULATORY_CARE_PROVIDER_SITE_OTHER): Payer: Medicare HMO | Admitting: Family Medicine

## 2021-08-14 ENCOUNTER — Encounter: Payer: Self-pay | Admitting: Family Medicine

## 2021-08-14 ENCOUNTER — Other Ambulatory Visit (HOSPITAL_COMMUNITY)
Admission: RE | Admit: 2021-08-14 | Discharge: 2021-08-14 | Disposition: A | Discharge status: Home / Self Care | Payer: Medicare HMO | Source: Ambulatory Visit | Attending: Family Medicine | Admitting: Family Medicine

## 2021-08-14 ENCOUNTER — Ambulatory Visit: Payer: Medicare HMO | Admitting: Hematology

## 2021-08-14 VITALS — BP 122/68 | HR 84 | Temp 98.0°F | Wt 145.0 lb

## 2021-08-14 DIAGNOSIS — D509 Iron deficiency anemia, unspecified: Secondary | ICD-10-CM

## 2021-08-14 DIAGNOSIS — N898 Other specified noninflammatory disorders of vagina: Secondary | ICD-10-CM | POA: Insufficient documentation

## 2021-08-14 DIAGNOSIS — E1129 Type 2 diabetes mellitus with other diabetic kidney complication: Secondary | ICD-10-CM

## 2021-08-14 DIAGNOSIS — L659 Nonscarring hair loss, unspecified: Secondary | ICD-10-CM | POA: Diagnosis not present

## 2021-08-14 DIAGNOSIS — I5032 Chronic diastolic (congestive) heart failure: Secondary | ICD-10-CM | POA: Diagnosis not present

## 2021-08-14 DIAGNOSIS — L308 Other specified dermatitis: Secondary | ICD-10-CM | POA: Diagnosis not present

## 2021-08-14 MED ORDER — PANTOPRAZOLE SODIUM 40 MG PO TBEC: 40.0000 mg | DELAYED_RELEASE_TABLET | Freq: Every day | ORAL | 1 refills | Status: DC

## 2021-08-14 MED ORDER — FERROUS GLUCONATE 324 (37.5 FE) MG PO TABS: 1.0000 | ORAL_TABLET | Freq: Every day | ORAL | 3 refills | Status: DC

## 2021-08-14 NOTE — Patient Instructions (Signed)
A prescription for ferrous gluconate, a different form of iron, was sent to your pharmacy.  Perhaps this will be a little better tolerated/easier on your stomach.  A refill of Protonix also known as pantoprazole (acid medicine) was also sent to the pharmacy.  If you are unable to get your glucometer working with a new battery let me know so prescription for a new 1 can be sent to your pharmacy.

## 2021-08-14 NOTE — Progress Notes (Signed)
Subjective:    Patient ID: Anita Mccormick, female    DOB: 1945/09/06, 76 y.o.   MRN: [id number]  Chief Complaint  Patient presents with   Follow-up    Iron deficiency and DM.  Pt accompanied by her daughter.  HPI Patient was seen today for f/u.  Pt had appt with Heme/Onc, Dr Irene Limbo.  States has an u/s to evaluate her spleen tomorrow.  Pt taking ferrous sulfate, but states it "messes up" her stomach unless she takes it with protonix.  Pt states Dr. Irene Limbo mentioned her alopecia may be autoimmune related as pt has no hair on legs or head.  She did not have chemo for h/o breast ca.   Pt has not been checking her blood sugar regularly due to her machine needing a new battery and the soreness caused by pricking her finger.  Has a form to get diabetic shoes from [place] office.  An rx for diabetic shoes given to pt at recent [place].  Not currently having LE edema.  Noticed pruritis and skin irritation due to eczema.  Taking hot showers.  Pt also mentions vaginal irritation and possible d/c.  Denies dysuria, n/v, back pain, constipation.  Past Medical History:  Diagnosis Date   Cancer [facility])    history of no adjunctive RX( no chemo no RT)   Cerebrovascular accident ([place])    [place], HX OF 12/10/2006   Qualifier: Diagnosis of  By: Leanne Chang MD, Bruce     Diabetes mellitus type II, controlled ([place]) 12/10/2006   Poor control on Januvia 100mg  and glipizide 10mg  XL Lab Results  Component Value Date   HGBA1C 8.3* 11/02/2014        Eczema    Fatty liver disease, nonalcoholic 68/09/2120   Per Dr. Leanne Chang Lab Results  Component Value Date   ALT 31 11/02/2014   AST 57* 11/02/2014   ALKPHOS 104 11/02/2014   BILITOT 1.1 11/02/2014       Hypertension     Allergies  Allergen Reactions   Ace Inhibitors Cough    ????   Chlorthalidone     REACTION: unspecified   Metformin     REACTION: gi side effects   Penicillins     REACTION: urticaria (hives)   Sulfamethoxazole     REACTION: questionable     ROS General: Denies fever, chills, night sweats, changes in weight, changes in appetite  HEENT: Denies headaches, ear pain, changes in vision, rhinorrhea, sore throat CV: Denies CP, palpitations, SOB, orthopnea Pulm: Denies SOB, cough, wheezing GI: Denies abdominal pain, nausea, vomiting, diarrhea, constipation GU: Denies dysuria, hematuria, frequency +vaginal discharge/irritation Msk: Denies muscle cramps, joint pains Neuro: Denies weakness, numbness, tingling Skin: Denies bruising+alopecia, eczema Psych: Denies depression, anxiety, hallucinations     Objective:    Blood pressure 122/68, pulse 84, temperature 98 F (36.7 C), temperature source Oral, weight 145 lb (65.8 kg), SpO2 96 %.  Gen. Pleasant, well-nourished, in no distress, normal affect   HEENT: [place]/AT, face symmetric, conjunctiva clear, no scleral icterus, PERRLA, EOMI, nares patent without drainage, pharynx without erythema or exudate. Lungs: no accessory muscle use, CTAB, no wheezes or rales Cardiovascular: RRR, no m/r/g, no peripheral edema Musculoskeletal: No deformities, no cyanosis or clubbing, normal tone Neuro:  A&Ox3, CN II-XII intact, normal gait Skin:  Warm, dry, intact.  No hair on scalp or b/l Les, skin smooth and shiny.   Wt Readings from Last 3 Encounters:  08/14/21 145 lb (65.8 kg)  07/10/21 150 lb 9.6 oz (68.3  kg)  06/06/21 148 lb 14.4 oz (67.5 kg)    Lab Results  Component Value Date   WBC 5.6 08/07/2021   HGB 12.5 08/07/2021   HCT 36.3 08/07/2021   HCT 38.3 08/07/2021   PLT 123 (L) 08/07/2021   GLUCOSE 145 (H) 06/07/2021   CHOL 146 03/18/2017   TRIG 154.0 (H) 03/18/2017   HDL 49.80 03/18/2017   LDLDIRECT 95.8 07/02/2006   LDLCALC 65 03/18/2017   ALT 14 06/07/2021   AST 22 06/07/2021   NA 138 06/07/2021   K 4.3 06/07/2021   CL 109 06/07/2021   CREATININE 1.11 (H) 06/07/2021   BUN 13 06/07/2021   CO2 25 06/07/2021   TSH 0.76 06/05/2021   INR 1.2 06/06/2021   HGBA1C 6.6 (H)  06/05/2021   MICROALBUR <0.7 06/08/2019    Assessment/Plan:  Type 2 diabetes mellitus with other diabetic kidney complication, without long-term current use of insulin (HCC) -Hemoglobin A1c 6.6% on 11//22 -Continue glipizide XL 10 mg twice daily, Trulicity 1.5 mg weekly -Allergy to ACE I and metformin intolerance -Continue follow-up with podiatry for diabetic shoes.  Form completed  Iron deficiency anemia, unspecified iron deficiency anemia type  -hgb 12.5 on 08/07/21 -will try a different form of iron to see if better tolerated than ferrous sulfate. -discussed taking iron supplement with vitamin c to help increase absorption as well as spacing out timing of med from protonix. -continue f/u with GI to assess source of GIB. - Plan: Ferrous Gluconate 324 (37.5 Fe) MG TABS  Chronic diastolic CHF (congestive heart failure) (HCC) -stable -continue atenolol BID -decrease sodium intake, elevate LEs, compression socks or TED hose. -Lasix as needed  Vaginal discharge -Patient was unable to provide urine sample during visit.  - Plan: POCT urinalysis dipstick, Cervicovaginal ancillary only  Other eczema -Discussed taking warm, not hot showers, using a gentle emollient moisturizing cream regularly -Topical steroids as needed.  Alopecia -Discussed follow-up with dermatology.  Patient to decide.  Will place referral if desired.  For patient's daughter to have access to MyChart completed.  F/u as needed in 1-3 months  [name], MD

## 2021-08-15 ENCOUNTER — Ambulatory Visit (HOSPITAL_COMMUNITY)
Admission: RE | Admit: 2021-08-15 | Discharge: 2021-08-15 | Disposition: A | Discharge status: Home / Self Care | Payer: Medicare HMO | Source: Ambulatory Visit | Attending: Hematology | Admitting: Hematology

## 2021-08-15 ENCOUNTER — Other Ambulatory Visit: Payer: Self-pay

## 2021-08-15 DIAGNOSIS — D696 Thrombocytopenia, unspecified: Secondary | ICD-10-CM | POA: Insufficient documentation

## 2021-08-15 DIAGNOSIS — R109 Unspecified abdominal pain: Secondary | ICD-10-CM | POA: Insufficient documentation

## 2021-08-15 DIAGNOSIS — K7689 Other specified diseases of liver: Secondary | ICD-10-CM | POA: Diagnosis not present

## 2021-08-16 LAB — CERVICOVAGINAL ANCILLARY ONLY
Bacterial Vaginitis (gardnerella): NEGATIVE
Candida Glabrata: NEGATIVE
Candida Vaginitis: NEGATIVE
Chlamydia: NEGATIVE
Comment: NEGATIVE
Comment: NEGATIVE
Comment: NEGATIVE
Comment: NEGATIVE
Comment: NEGATIVE
Comment: NORMAL
Neisseria Gonorrhea: NEGATIVE
Trichomonas: NEGATIVE

## 2021-08-27 ENCOUNTER — Inpatient Hospital Stay (HOSPITAL_BASED_OUTPATIENT_CLINIC_OR_DEPARTMENT_OTHER): Payer: Medicare HMO | Admitting: Hematology

## 2021-08-27 DIAGNOSIS — D509 Iron deficiency anemia, unspecified: Secondary | ICD-10-CM | POA: Diagnosis not present

## 2021-08-27 DIAGNOSIS — D696 Thrombocytopenia, unspecified: Secondary | ICD-10-CM | POA: Diagnosis not present

## 2021-08-27 DIAGNOSIS — D649 Anemia, unspecified: Secondary | ICD-10-CM | POA: Diagnosis not present

## 2021-08-28 ENCOUNTER — Telehealth: Payer: Self-pay | Admitting: Hematology

## 2021-08-28 DIAGNOSIS — R932 Abnormal findings on diagnostic imaging of liver and biliary tract: Secondary | ICD-10-CM | POA: Diagnosis not present

## 2021-08-28 DIAGNOSIS — D509 Iron deficiency anemia, unspecified: Secondary | ICD-10-CM | POA: Diagnosis not present

## 2021-08-28 DIAGNOSIS — R1904 Left lower quadrant abdominal swelling, mass and lump: Secondary | ICD-10-CM | POA: Diagnosis not present

## 2021-08-28 NOTE — Telephone Encounter (Signed)
Left message with follow-up appointment per 1/31 los.

## 2021-08-29 ENCOUNTER — Other Ambulatory Visit: Payer: Self-pay | Admitting: Internal Medicine

## 2021-08-29 DIAGNOSIS — R1904 Left lower quadrant abdominal swelling, mass and lump: Secondary | ICD-10-CM

## 2021-08-29 DIAGNOSIS — R932 Abnormal findings on diagnostic imaging of liver and biliary tract: Secondary | ICD-10-CM

## 2021-09-01 ENCOUNTER — Other Ambulatory Visit: Payer: Self-pay | Admitting: Family Medicine

## 2021-09-02 NOTE — Progress Notes (Signed)
[name]   HEMATOLOGY/ONCOLOGY CONSULTATION NOTE  Date of Service: .08/27/2021   Patient Care Team: Billie Ruddy, MD as PCP - General (Family Medicine) Martinique, Peter M, MD as PCP - Cardiology (Cardiology) Warden Fillers, MD as Consulting Physician (Ophthalmology) Viona Gilmore, [facility] as Pharmacist (Pharmacist)  CHIEF COMPLAINTS/PURPOSE OF CONSULTATION:  Follow-up for continued evaluation and management of anemia  HISTORY OF PRESENTING ILLNESS:   Anita ARSENEAU is a wonderful 76 y.o. female who has been referred to [place] by Dr .Volanda Napoleon, Langley Adie, MD for evaluation and management of Anemia.  Patient has a history of hypertension, diabetes type 2, remote history of left-sided breast cancer status postmastectomy (no adjuvant radiation or chemotherapy), fatty liver possible liver cirrhosis, chronic kidney disease stage II, diastolic CHF who recently was admitted to hospital with symptomatic anemia.  She was noted to have dyspnea on exertion and generalized weakness and presented with a hemoglobin of 7.1 She received 1 unit of PRBC and 1 dose of IV iron. Hemoglobin on discharge was 8.6 and she was recommended to have an outpatient GI work-up. She was noted to have she was placed on a PPI for possible upper GI bleeding. She was seen by her primary care physician for follow-up on 07/10/2021 and has been referred to gastroenterology for evaluation of possible GI bleeding. Labs done on 07/11/2021 showed her hemoglobin had improved to 11.6 with an MCV of 97, normal WBC count of 4.6k platelets of 98k. Ferritin was up to 73 from a previous level of 7. Iron saturation was up to 62% from previous level of 4%.  Patient notes no acute weight loss.  Notes no overt visible evidence of GI bleeding or melena. No acute new abdominal pain. No other overt source of bleeding.  No hematuria no epistaxis no gum bleeding.  Patient does have calluses on her feet and is following with podiatry to manage  these.  INTERVAL HISTORY  .I connected with [name] on .01/31/2023at  2:20 PM EST by telephone visit and verified that I am speaking with the correct person using two identifiers.   I discussed the limitations, risks, security and privacy concerns of performing an evaluation and management service by telemedicine and the availability of in-person appointments. I also discussed with the patient that there may be a patient responsible charge related to this service. The patient expressed understanding and agreed to proceed.   Other persons participating in the visit and their role in the encounter: None  Patients location: Home Providers location: [place]  Chief Complaint: Review of labs for continued evaluation and management of anemia  Patient notes no acute new symptoms since her last clinic visit. Her labs from 08/07/2021 were reviewed in details.  CBC shows resolution of anemia with a hemoglobin of 12.5 was 7.1 in the hospital, WBC count of 5.6k platelets have improved to 123k Ferritin 107 with an iron saturation of 24%. B12 250, RBC folate within normal limits Rheumatoid arthritis panel negative for CCP and rheumatoid factor antibodies Ultrasound abdomen done on 08/15/2021 showed minimal nodularity on the surface of the liver, possibility of liver cirrhosis not excluded.  No overt splenomegaly noted.   MEDICAL HISTORY:  Past Medical History:  Diagnosis Date   Cancer [facility])    history of no adjunctive RX( no chemo no RT)   Cerebrovascular accident ([place])    [place], HX OF 12/10/2006   Qualifier: Diagnosis of  By: Leanne Chang MD, Bruce     Diabetes mellitus  type II, controlled ([place]) 12/10/2006   Poor control on Januvia 167m and glipizide 190mXL Lab Results  Component Value Date   HGBA1C 8.3* 11/02/2014        Eczema    Fatty liver disease, nonalcoholic 1263/33/5456 Per Dr. SwLeanne Changab Results  Component Value Date   ALT 31 11/02/2014   AST 57*  11/02/2014   ALKPHOS 104 11/02/2014   BILITOT 1.1 11/02/2014       Hypertension     SURGICAL HISTORY: Past Surgical History:  Procedure Laterality Date   CATARACT EXTRACTION Left    MASTECTOMY Left    TUBAL LIGATION      SOCIAL HISTORY: Social History   Socioeconomic History   Marital status: Married    Spouse name: Not on file   Number of children: Not on file   Years of education: Not on file   Highest education level: Not on file  Occupational History   Not on file  Tobacco Use   Smoking status: Every Day    Packs/day: 0.10    Years: 10.00    Pack years: 1.00    Types: Cigarettes   Smokeless tobacco: Never   Tobacco comments:    3 cigs a day :not smoking due to illness (01/17/17)  Vaping Use   Vaping Use: Never used  Substance and Sexual Activity   Alcohol use: Yes    Alcohol/week: 0.0 standard drinks    Comment: a drink every now and again   Drug use: No   Sexual activity: Not Currently  Other Topics Concern   Not on file  Social History Narrative   Married (husband HaLynann Bologna 4 children. 11 grandkids. 1 greatgrandchild.       Still working as hoRadiation protection practitionerplaying cards, board games, cooking, enAir traffic controller Social Determinants of He[employer] broadcast assistanttrain: Low Risk    Difficulty of Paying Living Expenses: Not very hard  Food Insecurity: Not on file  Transportation Needs: No Transportation Needs   Lack of Transportation (Medical): No   Lack of Transportation (Non-Medical): No  Physical Activity: Not on file  Stress: Not on file  Social Connections: Not on file  Intimate Partner Violence: Not on file    FAMILY HISTORY: Family History  Problem Relation Age of Onset   Stroke Mother    Cancer Father        prostate   Diabetes Sister    Pancreatic cancer Sister     ALLERGIES:  is allergic to ace inhibitors, chlorthalidone, metformin, penicillins, and sulfamethoxazole.  MEDICATIONS:  Current Outpatient Medications  Medication  Sig Dispense Refill   atenolol (TENORMIN) 100 MG tablet TAKE 1 TABLET BY MOUTH TWICE A DAY 180 tablet 1   blood glucose meter kit and supplies KIT Dispense based on patient and insurance preference. Use up to four times daily as directed. 1 each 0   Blood Glucose Monitoring Suppl (ONETOUCH ULTRALINK) w/Device KIT Test twice daily (onetouch ultra) 1 each 0   Blood Pressure Monitoring (BLOOD PRESSURE CUFF) MISC Use daily to monitor blood pressue 1 each 0   Calcium Carbonate-Vitamin D (CALCIUM PLUS VITAMIN D) 600-100 MG-UNIT CAPS Take 1 capsule by mouth 2 (two) times daily. (Patient not taking: Reported on 08/07/2021)     Continuous Blood Gluc Receiver (FREESTYLE LIBRE READER) DEVI 1 Device by Does not apply route 4 (four) times daily as needed. 1 each 0   Continuous Blood Gluc Sensor (FREESTYLE LIBRE 14  DAY SENSOR) MISC 1 Device by Does not apply route every 14 (fourteen) days. 2 each 5   Ferrous Gluconate 324 (37.5 Fe) MG TABS Take 1 tablet (324 mg total) by mouth daily with breakfast. 30 tablet 3   furosemide (LASIX) 20 MG tablet Take 1 tablet (20 mg total) by mouth daily for 3 days. 3 tablet 0   glipiZIDE (GLUCOTROL XL) 10 MG 24 hr tablet TAKE 1 TABLET BY MOUTH TWICE A DAY 180 tablet 1   Lancets (ONETOUCH ULTRASOFT) lancets Use as instructed 100 each 12   ONETOUCH ULTRA test strip USE AS INSTRUCTED. FOR 3 TIMES A DAY TESTING, MORE FOR HYPO OR HYPERGLYCEMIA. 100 strip 1   pantoprazole (PROTONIX) 40 MG tablet Take 1 tablet (40 mg total) by mouth daily. 90 tablet 1   TRULICITY 1.5 EL/3.8BO SOPN INJECT 1.5 MG INTO THE SKIN ONCE A WEEK. 2 mL 9   No current facility-administered medications for this visit.    REVIEW OF SYSTEMS:    No overt GI bleeding at this time. No abdominal pain or distention.   PHYSICAL EXAMINATION: Telemedicine visit  LABORATORY DATA:  I have reviewed the data as listed  . CBC Latest Ref Rng & Units 08/07/2021 08/07/2021 07/11/2021  WBC 4.0 - 10.5 K/uL 5.6 - 4.6   Hemoglobin 12.0 - 15.0 g/dL 12.5 - 11.6(L)  Hematocrit 34.0 - 46.6 % 38.3 36.3 35.6(L)  Platelets 150 - 400 K/uL 123(L) - 98.0(L)    . CMP Latest Ref Rng & Units 06/07/2021 06/06/2021 06/05/2021  Glucose 70 - 99 mg/dL 145(H) 323(H) 219(H)  BUN 8 - 23 mg/dL 13 17 18   Creatinine 0.44 - 1.00 mg/dL 1.11(H) 1.24(H) 1.23(H)  Sodium 135 - 145 mmol/L 138 136 137  Potassium 3.5 - 5.1 mmol/L 4.3 4.8 4.6  Chloride 98 - 111 mmol/L 109 104 104  CO2 22 - 32 mmol/L 25 26 27   Calcium 8.9 - 10.3 mg/dL 8.8(L) 9.5 9.5  Total Protein 6.5 - 8.1 g/dL 6.2(L) 7.0 7.0  Total Bilirubin 0.3 - 1.2 mg/dL 1.9(H) 0.9 0.9  Alkaline Phos 38 - 126 U/L 74 92 88  AST 15 - 41 U/L 22 19 22   ALT 0 - 44 U/L 14 10 12    . Lab Results  Component Value Date   IRON 84 08/07/2021   TIBC 346 08/07/2021   IRONPCTSAT 24 08/07/2021   (Iron and TIBC)  Lab Results  Component Value Date   FERRITIN 107 08/07/2021   B12 -- 250 RBC folate within normal limits  RADIOGRAPHIC STUDIES: I have personally reviewed the radiological images as listed and agreed with the findings in the report. US Abdomen Complete  Result Date: 08/15/2021 CLINICAL DATA:  Thrombocytopenia, abdominal discomfort EXAM: ABDOMEN ULTRASOUND COMPLETE COMPARISON:  10/06/2012 FINDINGS: Gallbladder: No gallstones or wall thickening visualized. No sonographic Murphy sign noted by sonographer. Common bile duct: Diameter: 3.7 mm Liver: No focal abnormality is seen. There is possible minimal nodularity in the liver surface. Portal vein is patent on color Doppler imaging with normal direction of blood flow towards the liver. IVC: No abnormality visualized. Pancreas: There is increased echogenicity in the visualized portions of pancreas suggesting fatty infiltration. Spleen: Size and appearance within normal limits. Right Kidney: Length: 9.6 cm. Echogenicity within normal limits. No mass or hydronephrosis visualized. Left Kidney: Length: 9.1 cm. Echogenicity within normal  limits. No mass or hydronephrosis visualized. Abdominal aorta: No aneurysm visualized. Other findings: None. IMPRESSION: There is minimal nodularity in the liver surface. Possibility of  cirrhosis is not excluded. There is inhomogeneous increased echogenicity in the liver without focal abnormalities. Abdominal sonogram is otherwise unremarkable. Electronically Signed   By: Elmer Picker M.D.   On: 08/15/2021 15:24    ASSESSMENT & PLAN:   76 year old female with hypertension, dyslipidemia, diabetes, CHF with preserved ejection fraction, fatty liver with possible liver cirrhosis remote history of breast cancer status postmastectomy with no history of previous chemotherapy or radiation with.  1) Microcytic anemia due to significant iron deficiency Patient was admitted to the hospital in early November ,2022 with a hemoglobin of 7.1.  With a ferritin of 7 and iron saturation of 4%. She received 1 unit of PRBC and 1 dose of IV iron. Her recent hemoglobin levels admitted December have improved to 11.6 with an improvement in her ferritin iron saturation.  2) thrombocytopenia-chronic possible liver cirrhosis.  Possible other nutritional deficiencies. PLAN -Lab results from 08/07/2021 discussed in detail with the patient as noted above 3) . Patient Active Problem List   Diagnosis Date Noted   Symptomatic anemia 06/06/2021   Hyperglycemia due to diabetes mellitus ([place]) 06/06/2021   Elevated brain natriuretic peptide (BNP) level 06/06/2021   Atherosclerosis of aorta ([place]) 05/18/2020   Cirrhosis of liver without ascites ([place]) 05/18/2020   Dysuria 02/20/2018   Anemia    Sepsis due to Escherichia coli (E. coli) ([place])    AKI (acute kidney injury) ([place])    Sepsis ([place]) 09/19/2017   UTI (urinary tract infection) 09/19/2017   Chronic kidney disease (CKD), stage II (mild) 09/19/2017   Leukopenia 09/19/2017   Chronic diastolic CHF (congestive heart failure) ([place]) 09/19/2017   Statins contraindicated  06/12/2017   Hepatotoxicity due to statin drug 06/12/2017   History of adenomatous polyp of colon 03/18/2017   Viral URI with cough 01/17/2017   Thrombocytopenia ([place]) 12/19/2015   Insomnia 08/21/2015   Nicotine use disorder 05/21/2015   Eczema 05/21/2015   Fatty liver disease, nonalcoholic 38/25/0539   Hyperlipidemia 11/03/2007   History of CVA (cerebrovascular accident) 10/20/2007   Type II diabetes mellitus with renal manifestations ([place]) 12/10/2006   Essential hypertension 12/10/2006   History of breast cancer 12/10/2006  -Continue follow-up with primary care physician for management of other medical comorbidities. -CBC shows resolution of anemia with a hemoglobin of 12.5 was 7.1 in the hospital, WBC count of 5.6k platelets have improved to 123k Ferritin 107 with an iron saturation of 24%. B12 250, RBC folate within normal limits Rheumatoid arthritis panel negative for CCP and rheumatoid factor antibodies Ultrasound abdomen done on 08/15/2021 showed minimal nodularity on the surface of the liver, possibility of liver cirrhosis not excluded.  No overt splenomegaly noted.  -Patient has been given a referral to gastroenterology for EGD plus or minus colonoscopy and also for further evaluation of her possible liver cirrhosis.  Might need ultrasound with elastography.  -No indication for additional IV iron at this time. -Recommend taking vitamin B complex 1 capsule p.o. daily. -She will recheck her labs in about 4 months  No orders of the defined types were placed in this encounter.  Follow-up RTC with Dr Irene Limbo with labs in 4 months   All of the patients questions were answered with apparent satisfaction. The patient knows to call the clinic with any problems, questions or concerns.   Sullivan Lone MD [place] AAHIVMS [facility] Hematology/Oncology Physician [facility]  (Office):       [phone] (Work cell):  [phone] (Fax):  [phone]  09/02/2021 10:52  PM

## 2021-09-10 ENCOUNTER — Telehealth: Payer: Self-pay | Admitting: Pharmacist

## 2021-09-10 NOTE — Chronic Care Management (AMB) (Signed)
Chronic Care Management Pharmacy Assistant   Name: Anita Mccormick  MRN: [id number] DOB: 05-Sep-1945  Reason for Encounter: Disease State / Diabetes Assessment Call   Conditions to be addressed/monitored: DMII  Recent office visits:  08/14/2021 Anita Mccormick - Patient was seen for Type 2 diabetes mellitus with other diabetic kidney complication, without long-term current use of insulin and additional issues. Changed to Ferrous Gluconate 324 mg daily. Follow up in 2 months.  Recent consult visits:  08/27/2021 Anita China MD (oncology) - Patient was seen for anemia and additional issues. No medication changes. Follow up in 4 months.  08/13/2021 Anita Mccormick DPM (podiatry) - Patient was seen for Type II diabetes mellitus with neurological manifestations and additional issues. No medication changes. Follow up in 9 weeks.   08/07/2021 Anita China MD (oncology) - Patient was seen for anemia and additional issues. No medication changes. Follow up in 7-10 days  Hospital visits:  None  Medications: Outpatient Encounter Medications as of 09/10/2021  Medication Sig   atenolol (TENORMIN) 100 MG tablet TAKE 1 TABLET BY MOUTH TWICE A DAY   blood glucose meter kit and supplies KIT Dispense based on patient and insurance preference. Use up to four times daily as directed.   Blood Glucose Monitoring Suppl (ONETOUCH ULTRALINK) w/Device KIT Test twice daily (onetouch ultra)   Blood Pressure Monitoring (BLOOD PRESSURE CUFF) MISC Use daily to monitor blood pressue   Calcium Carbonate-Vitamin D (CALCIUM PLUS VITAMIN D) 600-100 MG-UNIT CAPS Take 1 capsule by mouth 2 (two) times daily. (Patient not taking: Reported on 08/07/2021)   Continuous Blood Gluc Receiver (FREESTYLE LIBRE READER) DEVI 1 Device by Does not apply route 4 (four) times daily as needed.   Continuous Blood Gluc Sensor (FREESTYLE LIBRE 14 DAY SENSOR) MISC 1 Device by Does not apply route every 14 (fourteen) days.   Ferrous Gluconate 324  (37.5 Fe) MG TABS Take 1 tablet (324 mg total) by mouth daily with breakfast.   furosemide (LASIX) 20 MG tablet Take 1 tablet (20 mg total) by mouth daily for 3 days.   glipiZIDE (GLUCOTROL XL) 10 MG 24 hr tablet Take 1 tablet (10 mg total) by mouth 2 (two) times daily.   Lancets (ONETOUCH ULTRASOFT) lancets Use as instructed   ONETOUCH ULTRA test strip USE AS INSTRUCTED. FOR 3 TIMES A DAY TESTING, MORE FOR HYPO OR HYPERGLYCEMIA.   pantoprazole (PROTONIX) 40 MG tablet Take 1 tablet (40 mg total) by mouth daily.   TRULICITY 1.5 RJ/1.8AC SOPN INJECT 1.5 MG INTO THE SKIN ONCE A WEEK.   No facility-administered encounter medications on file as of 09/10/2021.  Fill History: ATENOLOL 100 MG TABLET 16/60/6301 90   TRULICITY 1.5 SW/1.0 ML PEN 05/31/2021 28   ferrous gluconate 324 mg (37.5 mg iron) tablet 08/14/2021 90   FERROUS SULFATE 325 MG TABLET 06/07/2021 90   pantoprazole 40 mg tablet,delayed release 09/09/2021 90   glipizide ER 10 mg tablet, extended release 24 hr 09/03/2021 90   Recent Relevant Labs: Lab Results  Component Value Date/Time   HGBA1C 6.6 (H) 06/05/2021 12:22 PM   HGBA1C 6.1 (A) 03/07/2021 03:18 PM   HGBA1C 6.5 (H) 05/10/2020 02:02 PM   MICROALBUR <0.7 06/08/2019 12:55 PM   MICROALBUR 0.2 04/11/2014 11:36 AM    Kidney Function Lab Results  Component Value Date/Time   CREATININE 1.11 (H) 06/07/2021 06:37 AM   CREATININE 1.24 (H) 06/06/2021 12:05 PM   CREATININE 0.91 08/07/2017 03:45 PM   CREATININE 0.85 07/11/2016 03:33 PM  GFR 43.26 (L) 06/05/2021 12:22 PM   GFRNONAA 52 (L) 06/07/2021 06:37 AM   GFRAA >60 11/16/2017 06:00 PM    Current antihyperglycemic regimen:  Glipizide 10 mg twice daily Trulicity 1.5 DD/2.2 ml inject 1.5 mg once weely  What recent interventions/DTPs have been made to improve glycemic control:  No recent interventions   Have there been any recent hospitalizations or ED visits since last visit with CPP? No recent hospital  visits.  Patient  hypoglycemic symptoms, including   Patient hyperglycemic symptoms, including   How often are you checking your blood sugar?   What are your blood sugars ranging?  Fasting:  Before meals:  After meals:  Bedtime:   During the week, how often does your blood glucose drop below 70?   Are you checking your feet daily/regularly?   Adherence Review: Is the patient currently on a STATIN medication? No Is the patient currently on ACE/ARB medication? No Does the patient have >5 day gap between last estimated fill dates? No  Unable to reach patient after several attempts.  Care Gaps: AWV - message sent to [name] Last BP - 122/68 on 08/14/2021 Last A1C - 6.6 on 06/05/2021 Zoster vaccines - never done Eye exam - overdue  Foot exam - overdue  Flu vaccine - overdue Covid booster - overdue  Star Rating Drugs: Glipizide 10 mg  - last filled 09/03/2021 90 [facility] Trulicity 1.5 GU/5.4YH - filled through [facility]  [place] Pharmacist Assistant [phone]

## 2021-09-12 ENCOUNTER — Other Ambulatory Visit: Payer: Medicare HMO

## 2021-09-17 ENCOUNTER — Ambulatory Visit
Admission: RE | Admit: 2021-09-17 | Discharge: 2021-09-17 | Disposition: A | Discharge status: Home / Self Care | Payer: Medicare HMO | Source: Ambulatory Visit | Attending: Internal Medicine | Admitting: Internal Medicine

## 2021-09-17 ENCOUNTER — Other Ambulatory Visit: Payer: Self-pay | Admitting: Internal Medicine

## 2021-09-17 DIAGNOSIS — R932 Abnormal findings on diagnostic imaging of liver and biliary tract: Secondary | ICD-10-CM

## 2021-09-17 DIAGNOSIS — K7689 Other specified diseases of liver: Secondary | ICD-10-CM | POA: Diagnosis not present

## 2021-09-18 ENCOUNTER — Ambulatory Visit
Admission: RE | Admit: 2021-09-18 | Discharge: 2021-09-18 | Disposition: A | Discharge status: Home / Self Care | Payer: Medicare HMO | Source: Ambulatory Visit | Attending: Internal Medicine | Admitting: Internal Medicine

## 2021-09-18 ENCOUNTER — Other Ambulatory Visit: Payer: Self-pay

## 2021-09-18 DIAGNOSIS — Z853 Personal history of malignant neoplasm of breast: Secondary | ICD-10-CM | POA: Diagnosis not present

## 2021-09-18 DIAGNOSIS — K7689 Other specified diseases of liver: Secondary | ICD-10-CM | POA: Diagnosis not present

## 2021-09-18 DIAGNOSIS — R932 Abnormal findings on diagnostic imaging of liver and biliary tract: Secondary | ICD-10-CM

## 2021-09-18 DIAGNOSIS — R1904 Left lower quadrant abdominal swelling, mass and lump: Secondary | ICD-10-CM

## 2021-09-18 DIAGNOSIS — K439 Ventral hernia without obstruction or gangrene: Secondary | ICD-10-CM | POA: Diagnosis not present

## 2021-09-18 DIAGNOSIS — K5730 Diverticulosis of large intestine without perforation or abscess without bleeding: Secondary | ICD-10-CM | POA: Diagnosis not present

## 2021-09-18 MED ORDER — IOPAMIDOL (ISOVUE-300) INJECTION 61%
100.0000 mL | Freq: Once | INTRAVENOUS | Status: AC | PRN
  Administered 2021-09-18: 100 mL via INTRAVENOUS

## 2021-09-24 ENCOUNTER — Telehealth: Payer: Self-pay

## 2021-09-24 NOTE — Telephone Encounter (Signed)
Casts sent to [employer]  Shoes ordered from [facility] 09/16/2021 - [employer] [id number]

## 2021-10-01 ENCOUNTER — Ambulatory Visit (INDEPENDENT_AMBULATORY_CARE_PROVIDER_SITE_OTHER): Payer: Medicare HMO

## 2021-10-01 VITALS — Ht 62.0 in | Wt 147.0 lb

## 2021-10-01 DIAGNOSIS — Z0000 Encounter for general adult medical examination without abnormal findings: Secondary | ICD-10-CM

## 2021-10-01 NOTE — Patient Instructions (Signed)
Anita Mccormick , Thank you for taking time to come for your Medicare Wellness Visit. I appreciate your ongoing commitment to your health goals. Please review the following plan we discussed and let me know if I can assist you in the future.   Screening recommendations/referrals: Colonoscopy: completed 08/22/2019, due 08/21/2024 Mammogram: completed 2022 per patient Bone Density: completed 03/11/2021 Recommended yearly ophthalmology/optometry visit for glaucoma screening and checkup Recommended yearly dental visit for hygiene and checkup  Vaccinations: Influenza vaccine: due next flu season Pneumococcal vaccine: completed 08/21/2015 Tdap vaccine: due now Shingles vaccine: due   Covid-19: 03/11/2021, 05/12/2020, 10/25/2019, 09/25/2019  Advanced directives: forms sent to daughter  Conditions/risks identified: none  Next appointment: Follow up in one year for your annual wellness visit    Preventive Care 21 Years and Older, Female Preventive care refers to lifestyle choices and visits with your health care provider that can promote health and wellness. What does preventive care include? A yearly physical exam. This is also called an annual well check. Dental exams once or twice a year. Routine eye exams. Ask your health care provider how often you should have your eyes checked. Personal lifestyle choices, including: Daily care of your teeth and gums. Regular physical activity. Eating a healthy diet. Avoiding tobacco and drug use. Limiting alcohol use. Practicing safe sex. Taking low-dose aspirin every day. Taking vitamin and mineral supplements as recommended by your health care provider. What happens during an annual well check? The services and screenings done by your health care provider during your annual well check will depend on your age, overall health, lifestyle risk factors, and family history of disease. Counseling  Your health care provider may ask you questions about  your: Alcohol use. Tobacco use. Drug use. Emotional well-being. Home and relationship well-being. Sexual activity. Eating habits. History of falls. Memory and ability to understand (cognition). Work and work Statistician. Reproductive health. Screening  You may have the following tests or measurements: Height, weight, and BMI. Blood pressure. Lipid and cholesterol levels. These may be checked every 5 years, or more frequently if you are over 10 years old. Skin check. Lung cancer screening. You may have this screening every year starting at age 63 if you have a 30-pack-year history of smoking and currently smoke or have quit within the past 15 years. Fecal occult blood test (FOBT) of the stool. You may have this test every year starting at age 25. Flexible sigmoidoscopy or colonoscopy. You may have a sigmoidoscopy every 5 years or a colonoscopy every 10 years starting at age 46. Hepatitis C blood test. Hepatitis B blood test. Sexually transmitted disease (STD) testing. Diabetes screening. This is done by checking your blood sugar (glucose) after you have not eaten for a while (fasting). You may have this done every 1-3 years. Bone density scan. This is done to screen for osteoporosis. You may have this done starting at age 23. Mammogram. This may be done every 1-2 years. Talk to your health care provider about how often you should have regular mammograms. Talk with your health care provider about your test results, treatment options, and if necessary, the need for more tests. Vaccines  Your health care provider may recommend certain vaccines, such as: Influenza vaccine. This is recommended every year. Tetanus, diphtheria, and acellular pertussis (Tdap, Td) vaccine. You may need a Td booster every 10 years. Zoster vaccine. You may need this after age 26. Pneumococcal 13-valent conjugate (PCV13) vaccine. One dose is recommended after age 22. Pneumococcal polysaccharide (PPSV23) vaccine.  One dose is recommended after age 2. Talk to your health care provider about which screenings and vaccines you need and how often you need them. This information is not intended to replace advice given to you by your health care provider. Make sure you discuss any questions you have with your health care provider. Document Released: 08/10/2015 Document Revised: 04/02/2016 Document Reviewed: 05/15/2015 Elsevier Interactive Patient Education  2017 [employer] Prevention in the Home Falls can cause injuries. They can happen to people of all ages. There are many things you can do to make your home safe and to help prevent falls. What can I do on the outside of my home? Regularly fix the edges of walkways and driveways and fix any cracks. Remove anything that might make you trip as you walk through a door, such as a raised step or threshold. Trim any bushes or trees on the path to your home. Use bright outdoor lighting. Clear any walking paths of anything that might make someone trip, such as rocks or tools. Regularly check to see if handrails are loose or broken. Make sure that both sides of any steps have handrails. Any raised decks and porches should have guardrails on the edges. Have any leaves, snow, or ice cleared regularly. Use sand or salt on walking paths during winter. Clean up any spills in your garage right away. This includes oil or grease spills. What can I do in the bathroom? Use night lights. Install grab bars by the toilet and in the tub and shower. Do not use towel bars as grab bars. Use non-skid mats or decals in the tub or shower. If you need to sit down in the shower, use a plastic, non-slip stool. Keep the floor dry. Clean up any water that spills on the floor as soon as it happens. Remove soap buildup in the tub or shower regularly. Attach bath mats securely with double-sided non-slip rug tape. Do not have throw rugs and other things on the floor that can make  you trip. What can I do in the bedroom? Use night lights. Make sure that you have a light by your bed that is easy to reach. Do not use any sheets or blankets that are too big for your bed. They should not hang down onto the floor. Have a firm chair that has side arms. You can use this for support while you get dressed. Do not have throw rugs and other things on the floor that can make you trip. What can I do in the kitchen? Clean up any spills right away. Avoid walking on wet floors. Keep items that you use a lot in easy-to-reach places. If you need to reach something above you, use a strong step stool that has a grab bar. Keep electrical cords out of the way. Do not use floor polish or wax that makes floors slippery. If you must use wax, use non-skid floor wax. Do not have throw rugs and other things on the floor that can make you trip. What can I do with my stairs? Do not leave any items on the stairs. Make sure that there are handrails on both sides of the stairs and use them. Fix handrails that are broken or loose. Make sure that handrails are as long as the stairways. Check any carpeting to make sure that it is firmly attached to the stairs. Fix any carpet that is loose or worn. Avoid having throw rugs at the top or bottom of the  stairs. If you do have throw rugs, attach them to the floor with carpet tape. Make sure that you have a light switch at the top of the stairs and the bottom of the stairs. If you do not have them, ask someone to add them for you. What else can I do to help prevent falls? Wear shoes that: Do not have high heels. Have rubber bottoms. Are comfortable and fit you well. Are closed at the toe. Do not wear sandals. If you use a stepladder: Make sure that it is fully opened. Do not climb a closed stepladder. Make sure that both sides of the stepladder are locked into place. Ask someone to hold it for you, if possible. Clearly mark and make sure that you can  see: Any grab bars or handrails. First and last steps. Where the edge of each step is. Use tools that help you move around (mobility aids) if they are needed. These include: Canes. Walkers. Scooters. Crutches. Turn on the lights when you go into a dark area. Replace any light bulbs as soon as they burn out. Set up your furniture so you have a clear path. Avoid moving your furniture around. If any of your floors are uneven, fix them. If there are any pets around you, be aware of where they are. Review your medicines with your doctor. Some medicines can make you feel dizzy. This can increase your chance of falling. Ask your doctor what other things that you can do to help prevent falls. This information is not intended to replace advice given to you by your health care provider. Make sure you discuss any questions you have with your health care provider. Document Released: 05/10/2009 Document Revised: 12/20/2015 Document Reviewed: 08/18/2014 Elsevier Interactive Patient Education  2017 [employer].

## 2021-10-01 NOTE — Progress Notes (Signed)
I connected with Anita Mccormick today by telephone and verified that I am speaking with the correct person using two identifiers. Location patient: home Location provider: work Persons participating in the virtual visit: Anita Mccormick, Anita Mccormick (daughter), Anita Durand LPN.   I discussed the limitations, risks, security and privacy concerns of performing an evaluation and management service by telephone and the availability of in person appointments. I also discussed with the patient that there may be a patient responsible charge related to this service. The patient expressed understanding and verbally consented to this telephonic visit.    Interactive audio and video telecommunications were attempted between this provider and patient, however failed, due to patient having technical difficulties OR patient did not have access to video capability.  We continued and completed visit with audio only.     Vital signs may be patient reported or missing.  Subjective:   Anita Mccormick is a 76 y.o. female who presents for Medicare Annual (Subsequent) preventive examination.  Review of Systems     Cardiac Risk Factors include: advanced age (>72mn, >>72women);diabetes mellitus;hypertension;dyslipidemia     Objective:    Today's Vitals   10/01/21 1328  Weight: 147 lb (66.7 kg)  Height: 5' 2"  (1.575 m)   Body mass index is 26.89 kg/m.  Advanced Directives 10/01/2021 06/06/2021 09/20/2017 12/19/2015 09/14/2015  Does Patient Have a Medical Advance Directive? No No No No Yes  Would patient like information on creating a medical advance directive? - No - Patient declined No - Patient declined Yes - Educational materials given -    Current Medications (verified) Outpatient Encounter Medications as of 10/01/2021  Medication Sig   atenolol (TENORMIN) 100 MG tablet TAKE 1 TABLET BY MOUTH TWICE A DAY   blood glucose meter kit and supplies KIT Dispense based on patient and insurance preference.  Use up to four times daily as directed.   Blood Glucose Monitoring Suppl (ONETOUCH ULTRALINK) w/Device KIT Test twice daily (onetouch ultra)   Blood Pressure Monitoring (BLOOD PRESSURE CUFF) MISC Use daily to monitor blood pressue   Calcium Carbonate-Vitamin D (CALCIUM PLUS VITAMIN D) 600-100 MG-UNIT CAPS Take 1 capsule by mouth 2 (two) times daily.   gabapentin (NEURONTIN) 100 MG capsule Take 100 mg by mouth at bedtime.   glipiZIDE (GLUCOTROL XL) 10 MG 24 hr tablet Take 1 tablet (10 mg total) by mouth 2 (two) times daily.   Lancets (ONETOUCH ULTRASOFT) lancets Use as instructed   ONETOUCH ULTRA test strip USE AS INSTRUCTED. FOR 3 TIMES A DAY TESTING, MORE FOR HYPO OR HYPERGLYCEMIA.   pantoprazole (PROTONIX) 40 MG tablet Take 1 tablet (40 mg total) by mouth daily.   TRULICITY 1.5 MKC/1.2XNSOPN INJECT 1.5 MG INTO THE SKIN ONCE A WEEK.   Continuous Blood Gluc Receiver (FREESTYLE LIBRE READER) DEVI 1 Device by Does not apply route 4 (four) times daily as needed. (Patient not taking: Reported on 10/01/2021)   Continuous Blood Gluc Sensor (FREESTYLE LIBRE 14 DAY SENSOR) MISC 1 Device by Does not apply route every 14 (fourteen) days. (Patient not taking: Reported on 10/01/2021)   Ferrous Gluconate 324 (37.5 Fe) MG TABS Take 1 tablet (324 mg total) by mouth daily with breakfast. (Patient not taking: Reported on 10/01/2021)   furosemide (LASIX) 20 MG tablet Take 1 tablet (20 mg total) by mouth daily for 3 days.   No facility-administered encounter medications on file as of 10/01/2021.    Allergies (verified) Ace inhibitors, Chlorthalidone, Metformin, Penicillins, and Sulfamethoxazole   History:  Past Medical History:  Diagnosis Date   Cancer [facility])    history of no adjunctive RX( no chemo no RT)   Cerebrovascular accident ([place])    [place], HX OF 12/10/2006   Qualifier: Diagnosis of  By: Leanne Chang MD, Bruce     Diabetes mellitus type II, controlled ([place]) 12/10/2006   Poor control on Januvia 175m and  glipizide 159mXL Lab Results  Component Value Date   HGBA1C 8.3* 11/02/2014        Eczema    Fatty liver disease, nonalcoholic 1262/[id number]/4854 Per Dr. SwLeanne Changab Results  Component Value Date   ALT 31 11/02/2014   AST 57* 11/02/2014   ALKPHOS 104 11/02/2014   BILITOT 1.1 11/02/2014       Hypertension    Past Surgical History:  Procedure Laterality Date   CATARACT EXTRACTION Left    MASTECTOMY Left    TUBAL LIGATION     Family History  Problem Relation Age of Onset   Stroke Mother    Cancer Father        prostate   Diabetes Sister    Pancreatic cancer Sister    Social History   Socioeconomic History   Marital status: Married    Spouse name: Not on file   Number of children: Not on file   Years of education: Not on file   Highest education level: Not on file  Occupational History   Not on file  Tobacco Use   Smoking status: Every Day    Packs/day: 0.10    Years: 10.00    Pack years: 1.00    Types: Cigarettes   Smokeless tobacco: Never   Tobacco comments:    3 cigs a day :not smoking due to illness (01/17/17)  Vaping Use   Vaping Use: Never used  Substance and Sexual Activity   Alcohol use: Yes    Comment: a drink every now and again   Drug use: No   Sexual activity: Not Currently  Other Topics Concern   Not on file  Social History Narrative   Married (husband HaLynann Bologna 4 children. 11 grandkids. 1 greatgrandchild.       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   Financial Resource Strain: Medium Risk   Difficulty of Paying Living Expenses: Somewhat hard  Food Insecurity: No Food Insecurity   Worried About RuCharity fundraisern the Last Year: Never true   RaArboriculturistn the Last Year: Never true  Transportation Needs: No Transportation Needs   Lack of Transportation (Medical): No   Lack of Transportation (Non-Medical): No  Physical Activity: Inactive   Days of Exercise per  Week: 0 days   Minutes of Exercise per Session: 0 min  Stress: No Stress Concern Present   Feeling of Stress : Not at all  Social Connections: Not on file    Tobacco Counseling Ready to quit: Yes Counseling given: Not Answered Tobacco comments: 3 cigs a day :not smoking due to illness (01/17/17)   Clinical Intake:  Pre-visit preparation completed: Yes  Pain : No/denies pain     Nutritional Status: BMI 25 -29 Overweight Nutritional Risks: None Diabetes: Yes  How often do you need to have someone help you when you read instructions, pamphlets, or other written materials from your doctor or pharmacy?: 1 - Never  Diabetic? Yes Nutrition Risk Assessment:  Has the patient had  any N/V/D within the last 2 months?  No  Does the patient have any non-healing wounds?  No  Has the patient had any unintentional weight loss or weight gain?  No   Diabetes:  Is the patient diabetic?  Yes  If diabetic, was a CBG obtained today?  No  Did the patient bring in their glucometer from home?  No  How often do you monitor your CBG's? Does not.   Financial Strains and Diabetes Management:  Are you having any financial strains with the device, your supplies or your medication? No .  Does the patient want to be seen by Chronic Care Management for management of their diabetes?  No  Would the patient like to be referred to a Nutritionist or for Diabetic Management?  No   Diabetic Exams:  Diabetic Eye Exam: Completed 2022 Diabetic Foot Exam: Completed 08/13/2021   Interpreter Needed?: No  Information entered by :: NAllen LPN   Activities of Daily Living In your present state of health, do you have any difficulty performing the following activities: 10/01/2021 06/06/2021  Hearing? N N  Vision? N N  Difficulty concentrating or making decisions? N N  Walking or climbing stairs? Y Y  Dressing or bathing? N N  Doing errands, shopping? Y N  Comment daughter goes with her -  Conservation officer, nature  and eating ? Y -  Comment husband cooks -  Using [employer]? N -  In the past six months, have you accidently leaked urine? Y -  Do you have problems with loss of bowel control? N -  Managing your Medications? N -  Managing your Finances? N -  Housekeeping or managing your Housekeeping? Y -  Some recent data might be hidden    Patient Care Team: Billie Ruddy, MD as PCP - General (Family Medicine) Martinique, Peter M, MD as PCP - Cardiology (Cardiology) Warden Fillers, MD as Consulting Physician (Ophthalmology) Viona Gilmore, [facility] as Pharmacist (Pharmacist)  Indicate any recent Medical Services you may have received from other than [facility] providers in the past year (date may be approximate).     Assessment:   This is a routine wellness examination for [facility].  Hearing/Vision screen Vision Screening - Comments:: Regular eye exams, [facility]  Dietary issues and exercise activities discussed: Current Exercise Habits: The patient does not participate in regular exercise at present   Goals Addressed             This Visit's Progress    Patient Stated       10/01/2021, to do better, wants feet to get better       Depression Screen PHQ 2/9 Scores 10/01/2021 03/18/2017 12/19/2015 09/14/2015 06/30/2014 09/27/2013  PHQ - 2 Score 0 0 0 0 0 0    Fall Risk Fall Risk  10/01/2021 03/07/2021 03/18/2017 12/19/2015 09/14/2015  Falls in the past year? 1 1 Yes Yes No  Comment was clammy, sweaty and fell - - - -  Number falls in past yr: 0 0 2 or more 1 -  Injury with Fall? 0 0 Yes No -  Risk for fall due to : Impaired balance/gait;Impaired mobility;Medication side effect - Other (Comment) - -  Risk for fall due to: Comment - - leg strength - -  Follow up Falls evaluation completed;Education provided;Falls prevention discussed - - - -    FALL RISK PREVENTION PERTAINING TO THE HOME:  Any stairs in or around the home? Yes  If so, are there  any without handrails? No  Home free of loose  throw rugs in walkways, pet beds, electrical cords, etc? Yes  Adequate lighting in your home to reduce risk of falls? Yes   ASSISTIVE DEVICES UTILIZED TO PREVENT FALLS:  Life alert? No  Use of a cane, walker or w/c? Yes  Grab bars in the bathroom? Yes  Shower chair or bench in shower? Yes  Elevated toilet seat or a handicapped toilet? Yes   TIMED UP AND GO:  Was the test performed? No .      Cognitive Function:     6CIT Screen 10/01/2021  What Year? 0 points  What month? 0 points  What time? 0 points  Count back from 20 0 points  Months in reverse 4 points  Repeat phrase 0 points  Total Score 4    Immunizations Immunization History  Administered Date(s) Administered   Fluad Quad(high Dose 65+) 07/05/2020   Influenza Split 04/30/2012   Influenza Whole 06/01/2007, 04/27/2008, 06/05/2009, 05/02/2010, 04/28/2011   Influenza, High Dose Seasonal PF 03/14/2015, 04/04/2016, 04/30/2017, 04/30/2018, 05/04/2019   Influenza,inj,Quad PF,6+ Mos 03/23/2013   Influenza-Unspecified 04/11/2014   PFIZER(Purple Top)SARS-COV-2 Vaccination 09/25/2019, 10/25/2019, 05/12/2020, 03/11/2021   Pneumococcal Conjugate-13 06/30/2014   Pneumococcal Polysaccharide-23 03/21/2009, 08/21/2015   Tdap 09/22/2011   Zoster, Live 03/19/2011    TDAP status: Due, Education has been provided regarding the importance of this vaccine. Advised may receive this vaccine at local pharmacy or Health Dept. Aware to provide a copy of the vaccination record if obtained from local pharmacy or Health Dept. Verbalized acceptance and understanding.  Flu Vaccine status: Up to date  Pneumococcal vaccine status: Up to date  Covid-19 vaccine status: Completed vaccines  Qualifies for Shingles Vaccine? Yes   Zostavax completed Yes   Shingrix Completed?: No.    Education has been provided regarding the importance of this vaccine. Patient has been advised to call insurance company to determine out of pocket expense if they  have not yet received this vaccine. Advised may also receive vaccine at local pharmacy or Health Dept. Verbalized acceptance and understanding.  Screening Tests Health Maintenance  Topic Date Due   Zoster Vaccines- Shingrix (1 of 2) Never done   OPHTHALMOLOGY EXAM  04/27/2018   INFLUENZA VACCINE  02/25/2021   COVID-19 Vaccine (5 - Booster for Pfizer series) 05/06/2021   TETANUS/TDAP  09/21/2021   HEMOGLOBIN A1C  12/03/2021   MAMMOGRAM  06/20/2022   FOOT EXAM  08/13/2022   COLONOSCOPY (Pts 45-79yr Insurance coverage will need to be confirmed)  08/21/2029   Pneumonia Vaccine 75 Years old  Completed   DEXA SCAN  Completed   Hepatitis C Screening  Completed   HPV VACCINES  Aged Out    Health Maintenance  Health Maintenance Due  Topic Date Due   Zoster Vaccines- Shingrix (1 of 2) Never done   OPHTHALMOLOGY EXAM  04/27/2018   INFLUENZA VACCINE  02/25/2021   COVID-19 Vaccine (5 - Booster for Pfizer series) 05/06/2021   TETANUS/TDAP  09/21/2021    Colorectal cancer screening: Type of screening: Colonoscopy. Completed 08/22/2019. Repeat every 5 years  Mammogram status: Completed 2022 per patient. Repeat every year  Bone Density status: Completed 03/11/2021.   Lung Cancer Screening: (Low Dose CT Chest recommended if Age 75-80years, 30 pack-year currently smoking OR have quit w/in 15years.) does not qualify.   Lung Cancer Screening Referral: no  Additional Screening:  Hepatitis C Screening: does qualify; Completed 09/28/2012  Vision Screening: Recommended annual ophthalmology exams for  early detection of glaucoma and other disorders of the eye. Is the patient up to date with their annual eye exam?  Yes  Who is the provider or what is the name of the office in which the patient attends annual eye exams? [facility] If pt is not established with a provider, would they like to be referred to a provider to establish care? No .   Dental Screening: Recommended annual dental exams  for proper oral hygiene  Community Resource Referral / Chronic Care Management: CRR required this visit?  Yes   CCM required this visit?  No      Plan:     I have personally reviewed and noted the following in the patients chart:   Medical and social history Use of alcohol, tobacco or illicit drugs  Current medications and supplements including opioid prescriptions.  Functional ability and status Nutritional status Physical activity Advanced directives List of other physicians Hospitalizations, surgeries, and ER visits in previous 12 months Vitals Screenings to include cognitive, depression, and falls Referrals and appointments  In addition, I have reviewed and discussed with patient certain preventive protocols, quality metrics, and best practice recommendations. A written personalized care plan for preventive services as well as general preventive health recommendations were provided to patient.     Kellie Simmering, LPN   01/29/5622   Nurse Notes: none  Due to this being a virtual visit, the after visit summary with patients personalized plan was offered to patient via mail or my-chart.  patient was mailed a copy of AVS.

## 2021-10-02 ENCOUNTER — Telehealth: Payer: Self-pay

## 2021-10-02 NOTE — Telephone Encounter (Signed)
? ?  Telephone encounter was:  Successful.  ?10/02/2021 ?Name: Anita Mccormick MRN: [id number] DOB: 07-17-1946 ? ?Anita Mccormick is a 76 y.o. year old female who is a primary care patient of Volanda Napoleon, Langley Adie, MD . The community resource team was consulted for assistance with  taxes ? ?Care guide performed the following interventions: Patient provided with information about care guide support team and interviewed to confirm resource needs.Mailing resources to daughters house Richard Miu ? ?Follow Up Plan:  Care guide will follow up with patient by phone over the next WEEK ? ? ?Larena Sox ?Care Guide, Embedded Care Coordination ?[place], Care Management  ?[phone] ?[address], [place], [place] [address] ?Phone: [phone] ?Email: [email or web address] ? ?  ? ?

## 2021-10-02 NOTE — Telephone Encounter (Signed)
? ?  Telephone encounter was:  Successful.  ?10/02/2021 ?Name: MARKAYLA REICHART MRN: [id number] DOB: Oct 03, 1945 ? ?Anita Mccormick is a 76 y.o. year old female who is a primary care patient of Volanda Napoleon, Langley Adie, MD . The community resource team was consulted for assistance with  taxes ? ?Care guide performed the following interventions: Patient provided with information about care guide support team and interviewed to confirm resource needs.Patient and Daughter Rhunette Croft stated she is in need of assistance with property tax ? ?Follow Up Plan:  Care guide will follow up with patient by phone over the next week ? ? ?Larena Sox ?Care Guide, Embedded Care Coordination ?[place], Care Management  ?[phone] ?[address], [place], [place] [address] ?Phone: [phone] ?Email: [email or web address] ? ?  ? ?

## 2021-10-09 ENCOUNTER — Other Ambulatory Visit: Payer: Self-pay | Admitting: Family Medicine

## 2021-10-09 DIAGNOSIS — E1129 Type 2 diabetes mellitus with other diabetic kidney complication: Secondary | ICD-10-CM

## 2021-10-14 ENCOUNTER — Ambulatory Visit (INDEPENDENT_AMBULATORY_CARE_PROVIDER_SITE_OTHER): Payer: Medicare HMO | Admitting: Family Medicine

## 2021-10-14 ENCOUNTER — Encounter: Payer: Self-pay | Admitting: Family Medicine

## 2021-10-14 VITALS — BP 128/66 | HR 83 | Temp 98.6°F | Wt 146.2 lb

## 2021-10-14 DIAGNOSIS — F17210 Nicotine dependence, cigarettes, uncomplicated: Secondary | ICD-10-CM | POA: Diagnosis not present

## 2021-10-14 DIAGNOSIS — W19XXXA Unspecified fall, initial encounter: Secondary | ICD-10-CM

## 2021-10-14 DIAGNOSIS — N200 Calculus of kidney: Secondary | ICD-10-CM | POA: Diagnosis not present

## 2021-10-14 DIAGNOSIS — K439 Ventral hernia without obstruction or gangrene: Secondary | ICD-10-CM | POA: Diagnosis not present

## 2021-10-14 DIAGNOSIS — R42 Dizziness and giddiness: Secondary | ICD-10-CM | POA: Diagnosis not present

## 2021-10-14 DIAGNOSIS — R1011 Right upper quadrant pain: Secondary | ICD-10-CM | POA: Diagnosis not present

## 2021-10-14 DIAGNOSIS — D509 Iron deficiency anemia, unspecified: Secondary | ICD-10-CM

## 2021-10-14 DIAGNOSIS — K7460 Unspecified cirrhosis of liver: Secondary | ICD-10-CM | POA: Diagnosis not present

## 2021-10-14 DIAGNOSIS — F17200 Nicotine dependence, unspecified, uncomplicated: Secondary | ICD-10-CM

## 2021-10-14 DIAGNOSIS — I5032 Chronic diastolic (congestive) heart failure: Secondary | ICD-10-CM

## 2021-10-14 DIAGNOSIS — E1129 Type 2 diabetes mellitus with other diabetic kidney complication: Secondary | ICD-10-CM

## 2021-10-14 MED ORDER — GLUCOSE BLOOD VI STRP: 1.0000 | ORAL_STRIP | Freq: Four times a day (QID) | 12 refills | Status: DC | PRN

## 2021-10-14 NOTE — Progress Notes (Signed)
Subjective:  ? ? Patient ID: Anita Mccormick, female    DOB: Jan 16, 1946, 76 y.o.   MRN: [id number] ? ?Chief Complaint  ?Patient presents with  ? Follow-up  ?  DM, anemia, CHF,   ? Fall  ?  Fell 2 days ago, states was off balance  ? ? ?HPI ?Patient is a 76 yo female with pmh sig for HTN, DM 2, h/o L breast cancer s/p mastectomy, cirrhosis, iron deficiency anemia, HFpEF who was seen today for f/u.  Patient states she fell 2 days ago in her bedroom while trying to turn around.  Patient landed on her bottom.  Endorses soreness from fall.  Patient endorses feeling "off" this a.m and diaphoretic.  Patient did not have any glucose test strips to see what her blood sugar was.  Patient endorses decreased appetite.  Seen by GI for abdominal pain.  CT abdomen pelvis with contrast done 09/18/2021 with known cirrhosis of liver, possible portal hypertension, diverticulosis, ventral hernia possible lipoma and pancreas and 2 mm R renal calculi.  Patient endorses increased RUQ abdominal pain since that visit.  Tries not to take anything for pain but may take Aleve.  Patient denies changes in color or consistency of stools, pain that moves.  Still feeling cold all the time.  Has history of anemia requiring transfusion.  Patient did not want to see a general surgeon at the time of scan as she does not want surgery.  Pt smoking 1 cigarette/day.  Endorses occasional alcohol use, had 2 drinks over the weekend for her son's birthday.  Patient denies SOB, LE edema, CP, dizziness. ? ? ?Prior to patient's visit her daughter contacted the office to inform us that patient is still drinking and smoking cigarettes. ? ?Past Medical History:  ?Diagnosis Date  ? [facility])   ? history of no adjunctive RX( no chemo no RT)  ? Cerebrovascular accident [facility])   ? COLONIC POLYPS, HX OF 12/10/2006  ? Qualifier: Diagnosis of  By: Leanne Chang MD, Bruce    ? Diabetes mellitus type II, controlled ([place]) 12/10/2006  ? Poor control on Januvia '100mg'$  and glipizide '10mg'$  XL  Lab Results  Component Value Date   HGBA1C 8.3* 11/02/2014       ? Eczema   ? Fatty liver disease, nonalcoholic 65/68/1275  ? Per Dr. Leanne Chang Lab Results  Component Value Date   ALT 31 11/02/2014   AST 57* 11/02/2014   ALKPHOS 104 11/02/2014   BILITOT 1.1 11/02/2014      ? Hypertension   ? ? ?Allergies  ?Allergen Reactions  ? Ace Inhibitors Cough  ?  ????  ? Chlorthalidone   ?  REACTION: unspecified  ? Metformin   ?  REACTION: gi side effects  ? Penicillins   ?  REACTION: urticaria (hives)  ? Sulfamethoxazole   ?  REACTION: questionable  ? ? ?ROS ?General: Denies fever, chills, night sweats, changes in weight +changes in appetite  ?HEENT: Denies headaches, ear pain, changes in vision, rhinorrhea, sore throat ?CV: Denies CP, palpitations, SOB, orthopnea ?Pulm: Denies SOB, cough, wheezing ?GI: Denies nausea, vomiting, diarrhea, constipation  + abdominal pain ?GU: Denies dysuria, hematuria, frequency, vaginal discharge ?Msk: Denies muscle cramps, joint pains ?Neuro: Denies weakness, numbness, tingling ?Skin: Denies rashes, bruising ?Psych: Denies depression, anxiety, hallucinations ? ?   ?Objective:  ?  ?Blood pressure 128/66, pulse 83, temperature 98.6 ?F (37 ?C), temperature source Oral, weight 146 lb 3.2 oz (66.3 kg), SpO2 98 %. ? ?Gen. Pleasant, well-nourished,  in no distress appears mildly uncomfortable, normal affect   ?HEENT: [place]/AT, face symmetric, conjunctiva clear, no scleral icterus, PERRLA, EOMI, nares patent without drainage ?Lungs: no accessory muscle use, CTAB, no wheezes or rales ?Cardiovascular: RRR, no m/r/g, no peripheral edema ?Abdomen: BS present, soft, mild TTP in RUQ, ND. ?Musculoskeletal: No deformities, no cyanosis or clubbing, normal tone ?Neuro:  A&Ox3, CN II-XII intact, ambulating with cane. ?Skin:  Warm, no lesions/ rash ? ? ?Wt Readings from Last 3 Encounters:  ?10/01/21 147 lb (66.7 kg)  ?08/14/21 145 lb (65.8 kg)  ?07/10/21 150 lb 9.6 oz (68.3 kg)  ? ? ?Lab Results  ?Component Value Date   ? WBC 5.6 08/07/2021  ? HGB 12.5 08/07/2021  ? HCT 36.3 08/07/2021  ? HCT 38.3 08/07/2021  ? PLT 123 (L) 08/07/2021  ? GLUCOSE 145 (H) 06/07/2021  ? CHOL 146 03/18/2017  ? TRIG 154.0 (H) 03/18/2017  ? HDL 49.80 03/18/2017  ? LDLDIRECT 95.8 07/02/2006  ? [place] 65 03/18/2017  ? ALT 14 06/07/2021  ? AST 22 06/07/2021  ? NA 138 06/07/2021  ? K 4.3 06/07/2021  ? CL 109 06/07/2021  ? CREATININE 1.11 (H) 06/07/2021  ? BUN 13 06/07/2021  ? CO2 25 06/07/2021  ? TSH 0.76 06/05/2021  ? INR 1.2 06/06/2021  ? HGBA1C 6.6 (H) 06/05/2021  ? MICROALBUR <0.7 06/08/2019  ? ? ?Assessment/Plan: ? On day of service, 35 minutes spent caring for this patient face-to-face, reviewing the chart, counseling and/or coordinating care for plan and treatment of diagnosis below.   ? ?Right upper quadrant abdominal pain  ?-Increasing RUQ pain with history of cirrhosis ?-No stones seen in gallbladder from CT abdomen pelvis on 09/18/2021 ?-will obtain labs. ?-pt declined pain medication at this time.  Avoid tylenol.  NASID use sparingly as uGIB possible with h/o iron def anemia. ?-Continue follow-up with [facility] GI, Rolene Course, PA-C and Dr. Earlean Shawl.  Patient advised to set up appointment as no f/u appointments scheduled with GI. ?- For continued to worsen abdominal pain proceed to nearest ED for further evaluation. ?- Plan: CBC with Differential/Platelet, CMP, Lipase, POCT urinalysis dipstick ? ?Type 2 diabetes mellitus with other diabetic kidney complication, without long-term current use of insulin ([place]) ?-Hemoglobin A1c 6.6% on 06/05/2021 ?-Pt unable to check FSBS at home has was out of test strips.  New Rx sent to pharmacy. ?-Continue current medications including Trulicity 1.5 mg weekly, glipizide XL 10 mg twice daily, gabapentin ? - Plan: glucose blood test strip, Hemoglobin A1c, POCT urinalysis dipstick ? ?Fall from standing, initial encounter ?-Unclear if patient became dizzy 2/2 hypo or hyperglycemia.  Also consider hypotension ?-Given Rx  for test strips for One Touch ultra glucometer ?-Encouraged to use cane to aid with ambulation. ?-Given handout ? ?Iron deficiency anemia, unspecified iron deficiency anemia type  ?-Given dizziness recheck hemoglobin as previously required 1 unit pRBCs 06/06/2021 for symptomatic anemia. ?-Colonoscopy 08/22/2019 without source of colonic bleeding. ?-EGD planned. ?-From continued anemia consider iron infusion. ?- Plan: CBC with Differential/Platelet, Iron, TIBC and Ferritin Panel ? ?Ventral hernia without obstruction or gangrene  ?-CT abdomen pelvis 09/18/2021 with a 5.2 cm fat-containing ventral hernia in the left paramedian region in the suprapubic location ?-Patient given strict precautions for increased or worsening symptoms such as incarcerated or strangulated hernia ?- Plan: Ambulatory referral to General Surgery ? ?Tobacco use disorder ?-Pt smoking 1 cigarette/day ?-Smoking cessation counseling greater than 3 minutes, less than 10 minutes ?-Patient congratulated on cutting down and encouraged to further  decrease to no cigarettes per day.  Patient hesitant to do this. ?-Continue to encourage cessation. ? ?Cirrhosis of liver without ascites, unspecified hepatic cirrhosis type ([place]) ?-CT abdomen pelvis 09/18/2021 with nodularity in the liver surface suggesting cirrhosis.  There are ectatic vessels adjacent to the gastroesophageal junction suggesting possible portal hypertension.  Portal vein patent.  Possible 7 mm lipoma seen in the body of the pancreas ?-Discussed the importance of refraining from alcohol intake ?-Continue follow-up with GI ?- Plan: CBC with Differential/Platelet, CMP, Lipase ? ?Renal calculi  ?-Noted on CT abdomen pelvis from 09/18/2021.  A 2 mm stone in midportion of right kidney. ?-Discussed signs/symptoms of renal colic/passing stones ?-Increased hydration encouraged ?- Plan: POCT urinalysis dipstick ? ?Dizziness  ?-Discussed possible causes including hypo or hyperglycemia,  hypotension ?-Discussed the importance of hydration and eating regular meals ?- Plan: CBC with Differential/Platelet, CMP, Hemoglobin A1c, POCT urinalysis dipstick, Iron, TIBC and Ferritin Panel ? ?Chronic diastolic heart fa

## 2021-10-14 NOTE — Patient Instructions (Signed)
You should call the gastroenterology office to set up an appointment. ? ?A referral to see the general surgeon was placed.  They will call you about setting up the appt. ?

## 2021-10-15 LAB — COMPREHENSIVE METABOLIC PANEL
ALT: 22 U/L (ref 0–35)
AST: 63 U/L — ABNORMAL HIGH (ref 0–37)
Albumin: 3.9 g/dL (ref 3.5–5.2)
Alkaline Phosphatase: 99 U/L (ref 39–117)
BUN: 8 mg/dL (ref 6–23)
CO2: 26 mEq/L (ref 19–32)
Calcium: 9.6 mg/dL (ref 8.4–10.5)
Chloride: 106 mEq/L (ref 96–112)
Creatinine, Ser: 0.93 mg/dL (ref 0.40–1.20)
GFR: 60.35 mL/min (ref >60.00)
Glucose, Bld: 80 mg/dL (ref 70–99)
Potassium: 4.3 mEq/L (ref 3.5–5.1)
Sodium: 142 mEq/L (ref 135–145)
Total Bilirubin: 0.8 mg/dL (ref 0.2–1.2)
Total Protein: 7.4 g/dL (ref 6.0–8.3)

## 2021-10-15 LAB — CBC WITH DIFFERENTIAL/PLATELET
Basophils Absolute: 0 10*3/uL (ref 0.0–0.1)
Basophils Relative: 1.1 % (ref 0.0–3.0)
Eosinophils Absolute: 1 10*3/uL — ABNORMAL HIGH (ref 0.0–0.7)
Eosinophils Relative: 23.6 % — ABNORMAL HIGH (ref 0.0–5.0)
HCT: 40 % (ref 36.0–46.0)
Hemoglobin: 13.6 g/dL (ref 12.0–15.0)
Lymphocytes Relative: 19.1 % (ref 12.0–46.0)
Lymphs Abs: 0.8 10*3/uL (ref 0.7–4.0)
MCHC: 34.1 g/dL (ref 30.0–36.0)
MCV: 101.8 fl — ABNORMAL HIGH (ref 78.0–100.0)
Monocytes Absolute: 0.5 10*3/uL (ref 0.1–1.0)
Monocytes Relative: 11.4 % (ref 3.0–12.0)
Neutro Abs: 2 10*3/uL (ref 1.4–7.7)
Neutrophils Relative %: 44.8 % (ref 43.0–77.0)
Platelets: 126 10*3/uL — ABNORMAL LOW (ref 150.0–400.0)
RBC: 3.92 Mil/uL (ref 3.87–5.11)
RDW: 12.9 % (ref 11.5–15.5)
WBC: 4.4 10*3/uL (ref 4.0–10.5)

## 2021-10-15 LAB — HEMOGLOBIN A1C: Hgb A1c MFr Bld: 6.5 % (ref 4.6–6.5)

## 2021-10-15 LAB — LIPASE: Lipase: 30 U/L (ref 11.0–59.0)

## 2021-10-15 LAB — IRON,TIBC AND FERRITIN PANEL
%SAT: 43 % (calc) (ref 16–45)
Ferritin: 74 ng/mL (ref 16–288)
Iron: 151 ug/dL (ref 45–160)
TIBC: 353 mcg/dL (calc) (ref 250–450)

## 2021-10-16 ENCOUNTER — Telehealth: Payer: Self-pay

## 2021-10-16 NOTE — Telephone Encounter (Signed)
Lvm and sent mychart message for pt to schedule shoe pick up

## 2021-10-23 ENCOUNTER — Encounter: Payer: Self-pay | Admitting: Family Medicine

## 2021-10-23 ENCOUNTER — Ambulatory Visit (INDEPENDENT_AMBULATORY_CARE_PROVIDER_SITE_OTHER): Payer: Medicare HMO | Admitting: Family Medicine

## 2021-10-23 VITALS — BP 154/85 | HR 83 | Temp 98.8°F | Wt 142.6 lb

## 2021-10-23 DIAGNOSIS — E1129 Type 2 diabetes mellitus with other diabetic kidney complication: Secondary | ICD-10-CM | POA: Diagnosis not present

## 2021-10-23 DIAGNOSIS — R109 Unspecified abdominal pain: Secondary | ICD-10-CM | POA: Diagnosis not present

## 2021-10-23 DIAGNOSIS — Z87442 Personal history of urinary calculi: Secondary | ICD-10-CM | POA: Diagnosis not present

## 2021-10-23 MED ORDER — TRAMADOL HCL 50 MG PO TABS: 50.0000 mg | ORAL_TABLET | Freq: Three times a day (TID) | ORAL | 0 refills | Status: AC | PRN

## 2021-10-23 MED ORDER — TAMSULOSIN HCL 0.4 MG PO CAPS: 0.4000 mg | ORAL_CAPSULE | Freq: Every day | ORAL | 0 refills | Status: DC

## 2021-10-23 NOTE — Progress Notes (Signed)
Subjective:  ? ? Patient ID: Anita Mccormick, female    DOB: 15-Jan-1946, 76 y.o.   MRN: [id number] ? ?Chief Complaint  ?Patient presents with  ? Pain  ?  Pressure when possibly needing to go to the restroom, pain in right side of abdomen. States it is a swollen. Tried to slide off bed and son made it worse.   ? ? ?HPI ?Patient was seen today for ongoing concern.  Pt seen 10/14/21 for RUQ pain.  Tried to get appt with GI, but can't be seen until June.  Pt notes continued RUQ/R flank pain, constant, sharp, worse at night.  Pain feels like it is moving.  Patient endorses recent fall.  Slid off bed onto the floor.  Patient's son grabbed her around her waist to pick her up off the floor which caused her RUQ/side pain to increase. ? ?Patient called EMS over the weekend as her blood sugar was 44.  Given glucose.  Patient states she has not been checking her blood sugar as she does not have the correct strips for her meter.  Patient has an Accu-Chek meter however does not like using it.  Think she needs strips for a One Touch meter. ? ?Past Medical History:  ?Diagnosis Date  ? Cancer [facility])   ? history of no adjunctive RX( no chemo no RT)  ? Cerebrovascular accident [facility])   ? COLONIC POLYPS, HX OF 12/10/2006  ? Qualifier: Diagnosis of  By: Leanne Chang MD, Bruce    ? Diabetes mellitus type II, controlled ([place]) 12/10/2006  ? Poor control on Januvia '100mg'$  and glipizide '10mg'$  XL Lab Results  Component Value Date   HGBA1C 8.3* 11/02/2014       ? Eczema   ? Fatty liver disease, nonalcoholic 36/62/9476  ? Per Dr. Leanne Chang Lab Results  Component Value Date   ALT 31 11/02/2014   AST 57* 11/02/2014   ALKPHOS 104 11/02/2014   BILITOT 1.1 11/02/2014      ? Hypertension   ? ? ?Allergies  ?Allergen Reactions  ? Ace Inhibitors Cough  ?  ????  ? Chlorthalidone   ?  REACTION: unspecified  ? Metformin   ?  REACTION: gi side effects  ? Penicillins   ?  REACTION: urticaria (hives)  ? Sulfamethoxazole   ?  REACTION: questionable  ? ? ?ROS ?General: Denies  fever, chills, night sweats, changes in weight, changes in appetite ?HEENT: Denies headaches, ear pain, changes in vision, rhinorrhea, sore throat ?CV: Denies CP, palpitations, SOB, orthopnea ?Pulm: Denies SOB, cough, wheezing ?GI: Denies abdominal pain, nausea, vomiting, diarrhea, constipation  + RUQ/right flank pain ?GU: Denies dysuria, hematuria, frequency, vaginal discharge ?Msk: Denies muscle cramps, joint pains ?Neuro: Denies weakness, numbness, tingling ?Skin: Denies rashes, bruising ?Psych: Denies depression, anxiety, hallucinations ? ?Objective:  ?  ?Blood pressure (!) 154/85, pulse 83, temperature 98.8 ?F (37.1 ?C), temperature source Oral, weight 142 lb 9.6 oz (64.7 kg), SpO2 100 %. ? ?Gen. Pleasant, well-nourished, in no distress, normal affect   ?HEENT: [place]/AT, face symmetric, conjunctiva clear, no scleral icterus, PERRLA, EOMI, nares patent without drainage ?Lungs: no accessory muscle use, CTAB, no wheezes or rales ?Cardiovascular: RRR, no m/r/g, no peripheral edema ?Abdomen: BS present, soft, TTP of RUQ/right flank, ND. ?Musculoskeletal: No deformities, no cyanosis or clubbing, normal tone ?Neuro:  A&Ox3, CN II-XII intact, normal gait ?Skin:  Warm, no lesions/ rash ? ?Wt Readings from Last 3 Encounters:  ?10/23/21 142 lb 9.6 oz (64.7 kg)  ?10/14/21 146 lb  3.2 oz (66.3 kg)  ?10/01/21 147 lb (66.7 kg)  ? ? ?Lab Results  ?Component Value Date  ? WBC 4.4 10/14/2021  ? HGB 13.6 10/14/2021  ? HCT 40.0 10/14/2021  ? PLT 126.0 (L) 10/14/2021  ? GLUCOSE 80 10/14/2021  ? CHOL 146 03/18/2017  ? TRIG 154.0 (H) 03/18/2017  ? HDL 49.80 03/18/2017  ? LDLDIRECT 95.8 07/02/2006  ? [place] 65 03/18/2017  ? ALT 22 10/14/2021  ? AST 63 (H) 10/14/2021  ? NA 142 10/14/2021  ? K 4.3 10/14/2021  ? CL 106 10/14/2021  ? CREATININE 0.93 10/14/2021  ? BUN 8 10/14/2021  ? CO2 26 10/14/2021  ? TSH 0.76 06/05/2021  ? INR 1.2 06/06/2021  ? HGBA1C 6.5 10/14/2021  ? MICROALBUR <0.7 06/08/2019  ? ? ?Assessment/Plan: ? ?Right flank pain   ?- Plan: tamsulosin (FLOMAX) 0.4 MG CAPS capsule, traMADol (ULTRAM) 50 MG tablet ? ?History of renal calculi  ?-2 mm stone in R kidney noted on CT abd pelvis w/ contrast 09/18/21 ?- Plan: tamsulosin (FLOMAX) 0.4 MG CAPS capsule, traMADol (ULTRAM) 50 MG tablet ? ?Type 2 diabetes mellitus with other diabetic kidney complication, without long-term current use of insulin ([place]) ?-Hemoglobin A1c 6.5% on 10/14/2021 ?-Continue glipizide XL 10 mg twice daily, Trulicity 1.5 mg weekly. ?- Plan: glucose blood (ONETOUCH ULTRA) test strip ? ?Essential hypertension ?-Likely elevated 2/2 pain ?-Continue current medications including atenolol 100 mg twice daily ?-Continue lifestyle modifications ? ?Concern constant right flank/RUQ pain due to patient passing a kidney stone.  A 2 mm renal calculi in right kidney noted on CT abdomen pelvis with contrast from 09/18/2021.  Pt again unable to provide urine sample in clinic.  Will start tramadol and Flomax for presumed renal colic 2/2 kidney stone.  Patient to increase intake of water and fluids.  Given urine strainer. Given strict precautions for continued or worsened symptoms. ? ?F/u as needed ? ?[name], MD ?

## 2021-10-23 NOTE — Patient Instructions (Addendum)
The pain you are having may be related to the 2 mm kidney stone seen in your right kidney on CT scan from 09/18/2021.  A prescription for Flomax medication that increases your urination was sent to the pharmacy.  Make sure you are drinking plenty of water and fluids while you are taking this.  This will help pass kidney stone.  A prescription for tramadol was also sent to the pharmacy.  You can use this as needed for pain. ?

## 2021-10-24 ENCOUNTER — Encounter: Payer: Self-pay | Admitting: Family Medicine

## 2021-10-24 DIAGNOSIS — H3412 Central retinal artery occlusion, left eye: Secondary | ICD-10-CM | POA: Diagnosis not present

## 2021-10-24 LAB — HM DIABETES EYE EXAM

## 2021-10-24 MED ORDER — ONETOUCH ULTRA VI STRP: ORAL_STRIP | 11 refills | Status: DC

## 2021-10-25 ENCOUNTER — Encounter: Payer: Self-pay | Admitting: Family Medicine

## 2021-10-25 ENCOUNTER — Ambulatory Visit: Payer: Medicare HMO | Admitting: Family Medicine

## 2021-10-25 ENCOUNTER — Ambulatory Visit (INDEPENDENT_AMBULATORY_CARE_PROVIDER_SITE_OTHER): Payer: Medicare HMO | Admitting: Family Medicine

## 2021-10-25 VITALS — BP 125/60 | HR 83 | Temp 98.0°F

## 2021-10-25 DIAGNOSIS — I10 Essential (primary) hypertension: Secondary | ICD-10-CM | POA: Diagnosis not present

## 2021-10-25 DIAGNOSIS — H3413 Central retinal artery occlusion, bilateral: Secondary | ICD-10-CM

## 2021-10-25 DIAGNOSIS — H3411 Central retinal artery occlusion, right eye: Secondary | ICD-10-CM

## 2021-10-25 DIAGNOSIS — E1129 Type 2 diabetes mellitus with other diabetic kidney complication: Secondary | ICD-10-CM | POA: Diagnosis not present

## 2021-10-25 DIAGNOSIS — K7460 Unspecified cirrhosis of liver: Secondary | ICD-10-CM | POA: Diagnosis not present

## 2021-10-25 DIAGNOSIS — N390 Urinary tract infection, site not specified: Secondary | ICD-10-CM | POA: Diagnosis not present

## 2021-10-25 DIAGNOSIS — H3412 Central retinal artery occlusion, left eye: Secondary | ICD-10-CM | POA: Diagnosis not present

## 2021-10-25 DIAGNOSIS — D509 Iron deficiency anemia, unspecified: Secondary | ICD-10-CM

## 2021-10-25 DIAGNOSIS — N898 Other specified noninflammatory disorders of vagina: Secondary | ICD-10-CM | POA: Diagnosis not present

## 2021-10-25 LAB — LIPID PANEL
Cholesterol: 126 mg/dL (ref 0–200)
HDL: 54.3 mg/dL (ref >39.00)
LDL Cholesterol: 57 mg/dL (ref 0–99)
NonHDL: 71.85
Total CHOL/HDL Ratio: 2
Triglycerides: 76 mg/dL (ref 0.0–149.0)
VLDL: 15.2 mg/dL (ref 0.0–40.0)

## 2021-10-25 LAB — CBC WITH DIFFERENTIAL/PLATELET
Basophils Absolute: 0.1 10*3/uL (ref 0.0–0.1)
Basophils Relative: 1.4 % (ref 0.0–3.0)
Eosinophils Absolute: 1.1 10*3/uL — ABNORMAL HIGH (ref 0.0–0.7)
Eosinophils Relative: 24.4 % — ABNORMAL HIGH (ref 0.0–5.0)
HCT: 39.2 % (ref 36.0–46.0)
Hemoglobin: 13.2 g/dL (ref 12.0–15.0)
Lymphocytes Relative: 13 % (ref 12.0–46.0)
Lymphs Abs: 0.6 10*3/uL — ABNORMAL LOW (ref 0.7–4.0)
MCHC: 33.7 g/dL (ref 30.0–36.0)
MCV: 102.1 fl — ABNORMAL HIGH (ref 78.0–100.0)
Monocytes Absolute: 0.3 10*3/uL (ref 0.1–1.0)
Monocytes Relative: 6.6 % (ref 3.0–12.0)
Neutro Abs: 2.5 10*3/uL (ref 1.4–7.7)
Neutrophils Relative %: 54.6 % (ref 43.0–77.0)
Platelets: 121 10*3/uL — ABNORMAL LOW (ref 150.0–400.0)
RBC: 3.84 Mil/uL — ABNORMAL LOW (ref 3.87–5.11)
RDW: 14 % (ref 11.5–15.5)
WBC: 4.6 10*3/uL (ref 4.0–10.5)

## 2021-10-25 LAB — COMPREHENSIVE METABOLIC PANEL
ALT: 11 U/L (ref 0–35)
AST: 22 U/L (ref 0–37)
Albumin: 4.1 g/dL (ref 3.5–5.2)
Alkaline Phosphatase: 94 U/L (ref 39–117)
BUN: 15 mg/dL (ref 6–23)
CO2: 30 mEq/L (ref 19–32)
Calcium: 10.4 mg/dL (ref 8.4–10.5)
Chloride: 100 mEq/L (ref 96–112)
Creatinine, Ser: 1.11 mg/dL (ref 0.40–1.20)
GFR: 48.8 mL/min — ABNORMAL LOW (ref >60.00)
Glucose, Bld: 167 mg/dL — ABNORMAL HIGH (ref 70–99)
Potassium: 4.1 mEq/L (ref 3.5–5.1)
Sodium: 138 mEq/L (ref 135–145)
Total Bilirubin: 1.3 mg/dL — ABNORMAL HIGH (ref 0.2–1.2)
Total Protein: 7.4 g/dL (ref 6.0–8.3)

## 2021-10-25 LAB — C-REACTIVE PROTEIN: CRP: <1 mg/dL (ref 0.5–20.0)

## 2021-10-25 LAB — POCT URINALYSIS DIPSTICK
Bilirubin, UA: NEGATIVE
Blood, UA: NEGATIVE
Glucose, UA: POSITIVE — AB
Ketones, UA: NEGATIVE
Nitrite, UA: NEGATIVE
Protein, UA: NEGATIVE
Spec Grav, UA: 1.025 (ref 1.010–1.025)
Urobilinogen, UA: NEGATIVE E.U./dL — AB
pH, UA: 6 (ref 5.0–8.0)

## 2021-10-25 LAB — SEDIMENTATION RATE: Sed Rate: 48 mm/hr — ABNORMAL HIGH (ref 0–30)

## 2021-10-25 MED ORDER — ONETOUCH ULTRASOFT LANCETS MISC: 11 refills | Status: DC

## 2021-10-25 MED ORDER — ONETOUCH ULTRA VI STRP: ORAL_STRIP | 11 refills | Status: DC

## 2021-10-25 NOTE — Progress Notes (Addendum)
Subjective:  ? ? Patient ID: Anita Mccormick, female    DOB: 26-Jul-1946, 76 y.o.   MRN: [id number] ? ?Chief Complaint  ?Patient presents with  ? Eye Injury  ?  Lost vision wed after leaving LOV, went to eye dr yday and was informed had a stroke in left eye  ? ? ?HPI ?Patient is a 76 yo female with pmh sig for DM II, h/o breast ca s/p L mastectomy, alopecia, eczema, HTN, h/o CVA, cirrhosis, was seen today for acute f/u. ? ?Pt seen 3/29 in clinic, upon return home that evening she lost sigh in L eye.  Pt encouraged to go to ED per after hrs service.  Pt declined as she was waiting for her husband to return home and was taking care of her grandkids.  Seen the next day at [facility], dx'd with central artery occlusion OS.   ? ?Pt endorses blurry vision through a small hole in L eye, at times with flashes of color.  Pt states she has come to terms with the fact she will not regain vision in that eye.   ? ?Pt previously on ASA, d/c 2/2 chronic anemia requiring transfusion. ? ?Pt with continued RUQ/flank pain concerning for renal colic.  Pt has yet to start flomax and tramadol prn due to other recent health issues. ? ?Pt was unsure if pharmacy received rx for test strips and lancets as she was not given any on Monday when she was at the pharmacy.  Rxs were sent, but will print them for pt. ? ?Past Medical History:  ?Diagnosis Date  ? Cancer [facility])   ? history of no adjunctive RX( no chemo no RT)  ? Cerebrovascular accident [facility])   ? COLONIC POLYPS, HX OF 12/10/2006  ? Qualifier: Diagnosis of  By: Leanne Chang MD, Bruce    ? Diabetes mellitus type II, controlled ([place]) 12/10/2006  ? Poor control on Januvia '100mg'$  and glipizide '10mg'$  XL Lab Results  Component Value Date   HGBA1C 8.3* 11/02/2014       ? Eczema   ? Fatty liver disease, nonalcoholic 22/63/3354  ? Per Dr. Leanne Chang Lab Results  Component Value Date   ALT 31 11/02/2014   AST 57* 11/02/2014   ALKPHOS 104 11/02/2014   BILITOT 1.1 11/02/2014      ? Hypertension   ? ? ?Allergies   ?Allergen Reactions  ? Ace Inhibitors Cough  ?  ????  ? Chlorthalidone   ?  REACTION: unspecified  ? Metformin   ?  REACTION: gi side effects  ? Penicillins   ?  REACTION: urticaria (hives)  ? Sulfamethoxazole   ?  REACTION: questionable  ? ? ?ROS ?General: Denies fever, chills, night sweats, changes in weight, changes in appetite ?HEENT: Denies headaches, ear pain, changes in vision, rhinorrhea, sore throat + acute vision loss L eye ?CV: Denies CP, palpitations, SOB, orthopnea ?Pulm: Denies SOB, cough, wheezing ?GI: Denies nausea, vomiting, diarrhea, constipation  +abd pain ?GU: Denies dysuria, hematuria, frequency, vaginal discharge ?Msk: Denies muscle cramps, joint pains ?Neuro: Denies weakness, numbness, tingling ?Skin: Denies rashes, bruising ?Psych: Denies depression, anxiety, hallucinations ? ?   ?Objective:  ?  ?Blood pressure 125/60, pulse 83, temperature 98 ?F (36.7 ?C), temperature source Oral, SpO2 99 %. ? ?Gen. Pleasant, well-nourished, in no distress, normal affect   ?HEENT: [place]/AT, face symmetric, conjunctiva clear, no scleral icterus, PERRLA, EOMI, no nystagmus, nares patent without drainage, pharynx without erythema or exudate. ?Neck: No JVD, no thyromegaly, no carotid  bruits ?Lungs: no accessory muscle use, CTAB, no wheezes or rales ?Cardiovascular: RRR, no m/r/g, no peripheral edema ?Musculoskeletal: No deformities, no cyanosis or clubbing, normal tone ?Neuro:  A&Ox3, CN II-XII intact, normal gait ?Skin:  Warm, no lesions/ rash ? ? ?Wt Readings from Last 3 Encounters:  ?10/23/21 142 lb 9.6 oz (64.7 kg)  ?10/14/21 146 lb 3.2 oz (66.3 kg)  ?10/01/21 147 lb (66.7 kg)  ? ? ?Lab Results  ?Component Value Date  ? WBC 4.4 10/14/2021  ? HGB 13.6 10/14/2021  ? HCT 40.0 10/14/2021  ? PLT 126.0 (L) 10/14/2021  ? GLUCOSE 80 10/14/2021  ? CHOL 146 03/18/2017  ? TRIG 154.0 (H) 03/18/2017  ? HDL 49.80 03/18/2017  ? LDLDIRECT 95.8 07/02/2006  ? [place] 65 03/18/2017  ? ALT 22 10/14/2021  ? AST 63 (H) 10/14/2021   ? NA 142 10/14/2021  ? K 4.3 10/14/2021  ? CL 106 10/14/2021  ? CREATININE 0.93 10/14/2021  ? BUN 8 10/14/2021  ? CO2 26 10/14/2021  ? TSH 0.76 06/05/2021  ? INR 1.2 06/06/2021  ? HGBA1C 6.5 10/14/2021  ? MICROALBUR <0.7 06/08/2019  ? ? ?Assessment/Plan: ? ?Central retinal artery occlusion of Left eye ?-discussed unlikely to regain vision ?-discussed r/b/a of restarting anticoagulation.  Previously on ASA, but stopped 2/2 anemia requiring transfusion. ?-not currently on statin 2/2 h/o cirrhosis.  Will check lipid panel.  Based on lipid panel results consider once or twice a wk low dose statin.  Discussed r/b/a. ?-discussed the importance of bp and glucose control. ?-EKG this visit with NSR, consider LVH by criteria. Similar to EKG from 07/05/21. ? - Plan: Lipid panel, Sedimentation Rate, C-reactive Protein, EKG 12-Lead, ECHOCARDIOGRAM COMPLETE, Factor 5 leiden, Antithrombin panel, CBC with Differential/Platelet, PT AND PTT, CT ANGIO HEAD CODE STROKE, CT ANGIO NECK CODE STROKE, Troponin I ? ?Cirrhosis of liver without ascites, unspecified hepatic cirrhosis type ([place])  ?-stable ?-continue to refrain from EtOH use ?-f/u with GI, appt in May ?- Plan: CMP, PT AND PTT ? ?Essential hypertension ?-Controlled ?-continue atenolol 100 mg BID ?- Plan: EKG 12-Lead, CMP, Troponin I ? ?Type 2 diabetes mellitus with other diabetic kidney complication, without long-term current use of insulin ([place])  ?-controlled ?-hgb A1C 6.5% ?-continue glipzide XL 10 mg, trulicity 1.5 mg wkly,  ?- Plan: Lancets (ONETOUCH ULTRASOFT) lancets, glucose blood (ONETOUCH ULTRA) test strip, Troponin I ? ?F/u in 2-4 wks, sooner if needed ? ?[name], MD ?

## 2021-10-25 NOTE — Patient Instructions (Signed)
We may be able to start a medication called a statin to lower cholesterol if needed, but have you take it a few times per week instead of daily due to your liver function.  We will wait on the results of labs to determine this. ? ?At today's visit we also discussed the risks of starting medication to thin your blood.  Given your history of GI bleed it may cause recurrent bleeding.  We can get input on this from the hematologist as well as your gastroenterologist. ?

## 2021-10-26 LAB — URINE CULTURE
MICRO NUMBER:: 13207610
SPECIMEN QUALITY:: ADEQUATE

## 2021-10-27 LAB — PT AND PTT
INR: 1.1 (ref 0.9–1.2)
Prothrombin Time: 11.1 s (ref 9.1–12.0)
aPTT: 29 s (ref 24–33)

## 2021-10-27 LAB — ANTITHROMBIN PANEL
AT III AG PPP IMM-ACNC: 88 % (ref 72–124)
AntiThromb III Func: 94 % (ref 75–135)

## 2021-10-30 ENCOUNTER — Ambulatory Visit (HOSPITAL_BASED_OUTPATIENT_CLINIC_OR_DEPARTMENT_OTHER)
Admission: RE | Admit: 2021-10-30 | Discharge: 2021-10-30 | Disposition: A | Discharge status: Home / Self Care | Payer: Medicare HMO | Source: Ambulatory Visit | Attending: Family Medicine | Admitting: Family Medicine

## 2021-10-30 ENCOUNTER — Ambulatory Visit (HOSPITAL_BASED_OUTPATIENT_CLINIC_OR_DEPARTMENT_OTHER): Payer: Medicare HMO

## 2021-10-30 DIAGNOSIS — H3411 Central retinal artery occlusion, right eye: Secondary | ICD-10-CM | POA: Diagnosis not present

## 2021-10-30 DIAGNOSIS — I6523 Occlusion and stenosis of bilateral carotid arteries: Secondary | ICD-10-CM | POA: Diagnosis not present

## 2021-10-30 MED ORDER — IOHEXOL 350 MG/ML SOLN
100.0000 mL | Freq: Once | INTRAVENOUS | Status: AC | PRN
  Administered 2021-10-30: 60 mL via INTRAVENOUS

## 2021-11-03 LAB — TROPONIN I: Troponin I: 10 ng/L (ref <=47)

## 2021-11-03 LAB — FACTOR 5 LEIDEN: Result: NEGATIVE

## 2021-11-07 ENCOUNTER — Telehealth: Payer: Self-pay | Admitting: Pharmacist

## 2021-11-07 NOTE — Chronic Care Management (AMB) (Signed)
? ? ?Chronic Care Management ?Pharmacy Assistant  ? ?Name: Anita Mccormick  MRN: [id number] DOB: Oct 15, 1945 ? ?Reason for Encounter: Disease State / Hypertension Assessment Calls ?  ?Conditions to be addressed/monitored: ?HTN ? ?Recent office visits:  ?10/25/2021 [name] MD - Patient was seen for central retinal artery occlusion of left eye and additional issues. No medication changes. Follow up in 2 weeks.  ? ?10/23/2021 [name] MD - Patient was seen for right flank pain and additional issues. Started Tamsulosin 0.4 mg daily and Tramadol 50 mg every 8 hours. Return if symptoms worsen or fail to improve. ? ?10/14/2021 [name] MD - Patient was seen for Right upper quadrant abdominal pain and additional issues. Referral to general surgery. No medication changes. Return in about 1 month (around 11/14/2021), or if symptoms worsen or fail to improve. ? ?10/01/2021 Glenna Durand LPN - Medicare annual wellness exam.  ? ?Recent consult visits:  ?None ? ?Hospital visits:  ?None ? ?Medications: ?Outpatient Encounter Medications as of 11/07/2021  ?Medication Sig  ? ACCU-CHEK GUIDE test strip TEST UP TO 4 TIMES A DAY  ? atenolol (TENORMIN) 100 MG tablet TAKE 1 TABLET BY MOUTH TWICE A DAY  ? blood glucose meter kit and supplies KIT Dispense based on patient and insurance preference. Use up to four times daily as directed.  ? Blood Glucose Monitoring Suppl (ONETOUCH ULTRALINK) w/Device KIT Test twice daily (onetouch ultra)  ? Blood Pressure Monitoring (BLOOD PRESSURE CUFF) MISC Use daily to monitor blood pressue  ? Calcium Carbonate-Vitamin D (CALCIUM PLUS VITAMIN D) 600-100 MG-UNIT CAPS Take 1 capsule by mouth 2 (two) times daily.  ? Continuous Blood Gluc Receiver (FREESTYLE LIBRE READER) DEVI 1 Device by Does not apply route 4 (four) times daily as needed. (Patient not taking: Reported on 10/01/2021)  ? Continuous Blood Gluc Sensor (FREESTYLE LIBRE 14 DAY SENSOR) MISC 1 Device by Does not apply route every 14  (fourteen) days. (Patient not taking: Reported on 10/01/2021)  ? Ferrous Gluconate 324 (37.5 Fe) MG TABS Take 1 tablet (324 mg total) by mouth daily with breakfast. (Patient not taking: Reported on 10/01/2021)  ? furosemide (LASIX) 20 MG tablet Take 1 tablet (20 mg total) by mouth daily for 3 days.  ? gabapentin (NEURONTIN) 100 MG capsule Take 100 mg by mouth at bedtime.  ? glipiZIDE (GLUCOTROL XL) 10 MG 24 hr tablet Take 1 tablet (10 mg total) by mouth 2 (two) times daily.  ? glucose blood (ONETOUCH ULTRA) test strip Use as instructed for blood sugar testing 3 times daily.  ? glucose blood test strip 1 each by Other route 4 (four) times daily as needed for other. Use as instructed  ? Lancets (ONETOUCH ULTRASOFT) lancets Use as instructed  ? pantoprazole (PROTONIX) 40 MG tablet Take 1 tablet (40 mg total) by mouth daily.  ? tamsulosin (FLOMAX) 0.4 MG CAPS capsule Take 1 capsule (0.4 mg total) by mouth daily.  ? TRULICITY 1.5 IR/5.1OA SOPN INJECT 1.5 MG INTO THE SKIN ONCE A WEEK.  ? ?No facility-administered encounter medications on file as of 11/07/2021.  ?Fill History: ?FERROUS GLUCONATE 324 MG TAB 09/09/2021 90  ? ?ATENOLOL 100 MG TABLET 08/18/2021 90  ? ?PANTOPRAZOLE SOD DR 40 MG TAB 09/09/2021 90  ? ?TAMSULOSIN HCL 0.4 MG CAPSULE 10/23/2021 30  ? ?GLIPIZIDE ER 10 MG TABLET 09/03/2021 90  ? ?Reviewed chart prior to disease state call. Spoke with patient regarding BP ? ?Recent Office Vitals: ?BP Readings from Last 3 Encounters:  ?10/25/21  125/60  ?10/23/21 (!) 154/85  ?10/14/21 128/66  ? ?Pulse Readings from Last 3 Encounters:  ?10/25/21 83  ?10/23/21 83  ?10/14/21 83  ?  ?Wt Readings from Last 3 Encounters:  ?10/23/21 142 lb 9.6 oz (64.7 kg)  ?10/14/21 146 lb 3.2 oz (66.3 kg)  ?10/01/21 147 lb (66.7 kg)  ?  ? ?Kidney Function ?Lab Results  ?Component Value Date/Time  ? CREATININE 1.11 10/25/2021 12:36 PM  ? CREATININE 0.93 10/14/2021 03:21 PM  ? CREATININE 0.91 08/07/2017 03:45 PM  ? CREATININE 0.85 07/11/2016 03:33  PM  ? GFR 48.80 (L) 10/25/2021 12:36 PM  ? GFRNONAA 52 (L) 06/07/2021 06:37 AM  ? GFRAA >60 11/16/2017 06:00 PM  ? ? ? ?  Latest Ref Rng & Units 10/25/2021  ? 12:36 PM 10/14/2021  ?  3:21 PM 06/07/2021  ?  6:37 AM  ?BMP  ?Glucose 70 - 99 mg/dL 167   80   145    ?BUN 6 - 23 mg/dL _0 ?Creatinine 0.40 - 1.20 mg/dL 1.11   0.93   1.11    ?Sodium 135 - 145 mEq/L 138   142   138    ?Potassium 3.5 - 5.1 mEq/L 4.1   4.3   4.3    ?Chloride 96 - 112 mEq/L 100   106   109    ?CO2 19 - 32 mEq/L _1 ?Calcium 8.4 - 10.5 mg/dL 10.4   9.6   8.8    ? ? ?Current antihypertensive regimen:  ?Atenolol 100 mg 1 tablet twice daily ? ?How often are you checking your Blood Pressure?  ? ?Current home BP readings:  ? ?What recent interventions/DTPs have been made by any provider to improve Blood Pressure control since last CPP Visit: No recent interventions ? ?Any recent hospitalizations or ED visits since last visit with CPP? No recent hospital visits.  ? ?What diet changes have been made to improve Blood Pressure Control?  ?Patient follows ?Breakfast - patient will have ?Lunch - patient will have ?Dinner - patient will have ? ?What exercise is being done to improve your Blood Pressure Control?  ? ? ?Adherence Review: ?Is the patient currently on ACE/ARB medication? No ?Does the patient have >5 day gap between last estimated fill dates? No ? ?Patient declined this assessment call, she states she saw Dr. Volanda Napoleon yesterday and doesn't feel the need for this call today.  ? ?Care Gaps: ?AWV - completed 10/01/2021 ?Last BP - 125/60 on 10/25/2021 ?Last A1C - 6.5 on 10/14/2021 ?Zoster vaccines - never done ?Eye exam - overdue  ?Covid booster - overdue ?TDAP - overdue ? ?Star Rating Drugs: ?Glipizide 10 mg  - last filled 09/03/2021 90 DS [facility] ?Trulicity 1.5 OT/7.7NH - filled through PAP ? ?Gennie Alma CMA  ?Clinical Pharmacist Assistant ?[phone] ? ?

## 2021-11-08 ENCOUNTER — Ambulatory Visit (INDEPENDENT_AMBULATORY_CARE_PROVIDER_SITE_OTHER): Payer: Medicare HMO | Admitting: Family Medicine

## 2021-11-08 ENCOUNTER — Other Ambulatory Visit (HOSPITAL_COMMUNITY)
Admission: RE | Admit: 2021-11-08 | Discharge: 2021-11-08 | Disposition: A | Discharge status: Home / Self Care | Payer: Medicare HMO | Source: Ambulatory Visit | Attending: Family Medicine | Admitting: Family Medicine

## 2021-11-08 ENCOUNTER — Ambulatory Visit: Payer: Medicare HMO | Admitting: Family Medicine

## 2021-11-08 ENCOUNTER — Encounter: Payer: Self-pay | Admitting: Family Medicine

## 2021-11-08 VITALS — BP 144/80 | HR 79 | Temp 98.8°F | Ht 62.0 in | Wt 141.2 lb

## 2021-11-08 DIAGNOSIS — Z01419 Encounter for gynecological examination (general) (routine) without abnormal findings: Secondary | ICD-10-CM | POA: Diagnosis not present

## 2021-11-08 DIAGNOSIS — N898 Other specified noninflammatory disorders of vagina: Secondary | ICD-10-CM

## 2021-11-08 DIAGNOSIS — Z87442 Personal history of urinary calculi: Secondary | ICD-10-CM

## 2021-11-08 DIAGNOSIS — N23 Unspecified renal colic: Secondary | ICD-10-CM | POA: Diagnosis not present

## 2021-11-08 DIAGNOSIS — Z1151 Encounter for screening for human papillomavirus (HPV): Secondary | ICD-10-CM | POA: Insufficient documentation

## 2021-11-08 DIAGNOSIS — R1011 Right upper quadrant pain: Secondary | ICD-10-CM | POA: Diagnosis not present

## 2021-11-08 MED ORDER — TRAMADOL HCL 50 MG PO TABS: 50.0000 mg | ORAL_TABLET | Freq: Two times a day (BID) | ORAL | 0 refills | Status: AC | PRN

## 2021-11-08 NOTE — Progress Notes (Signed)
Subjective:  ? ? Patient ID: Anita Mccormick, female    DOB: 23-Dec-1945, 76 y.o.   MRN: [id number] ? ?Chief Complaint  ?Patient presents with  ? Follow-up  ? ? ?HPI ?Patient was seen today for on ongoing concerns.  Patient notes pain in right upper quadrant feels like it is moving and sharp.  Patient has not been using tramadol as she was only given a few pills due to it being an initial opioid prescription.  Patient recently started Flomax.  States was more focused on recent central retinal artery occlusion.  Patient also mentions brown vaginal discharge x1 month.  Denies dysuria, lower abdominal pain, constipation, vaginal irritation.  Has not tried anything for symptoms. ? ?Past Medical History:  ?Diagnosis Date  ? Cancer [facility])   ? history of no adjunctive RX( no chemo no RT)  ? Cerebrovascular accident [facility])   ? COLONIC POLYPS, HX OF 12/10/2006  ? Qualifier: Diagnosis of  By: Leanne Chang MD, Bruce    ? Diabetes mellitus type II, controlled ([place]) 12/10/2006  ? Poor control on Januvia '100mg'$  and glipizide '10mg'$  XL Lab Results  Component Value Date   HGBA1C 8.3* 11/02/2014       ? Eczema   ? Fatty liver disease, nonalcoholic 74/94/4967  ? Per Dr. Leanne Chang Lab Results  Component Value Date   ALT 31 11/02/2014   AST 57* 11/02/2014   ALKPHOS 104 11/02/2014   BILITOT 1.1 11/02/2014      ? Hypertension   ? ? ?Allergies  ?Allergen Reactions  ? Ace Inhibitors Cough  ?  ????  ? Chlorthalidone   ?  REACTION: unspecified  ? Metformin   ?  REACTION: gi side effects  ? Penicillins   ?  REACTION: urticaria (hives)  ? Sulfamethoxazole   ?  REACTION: questionable  ? ? ?ROS ?General: Denies fever, chills, night sweats, changes in weight, changes in appetite ?HEENT: Denies headaches, ear pain, rhinorrhea, sore throat  + change in vision ?CV: Denies CP, palpitations, SOB, orthopnea ?Pulm: Denies SOB, cough, wheezing ?GI: Denies nausea, vomiting, diarrhea, constipation  + RUQ abdominal pain that is moving ?GU: Denies dysuria, hematuria, frequency  +vaginal discharge ?Msk: Denies muscle cramps, joint pains ?Neuro: Denies weakness, numbness, tingling ?Skin: Denies rashes, bruising ?Psych: Denies depression, anxiety, hallucinations ? ?   ?Objective:  ?  ?Blood pressure (!) 144/80, pulse 79, temperature 98.8 ?F (37.1 ?C), temperature source Oral, height '5\' 2"'$  (1.575 m), weight 141 lb 3.2 oz (64 kg), SpO2 100 %. ? ?Gen. Pleasant, well-nourished, in no distress, normal affect   ?HEENT: [place]/AT, face symmetric, conjunctiva clear, no scleral icterus, PERRLA, EOMI, nares patent without drainage ?Lungs: no accessory muscle use, CTAB, no wheezes or rales ?Cardiovascular: RRR, no m/r/g, no peripheral edema ?Abdomen: BS present, soft, TTP over RUQ, no hepatosplenomegaly.  No CVA tenderness. ?GU: Normal external female genitalia, no hair present, no external lesions noted, normal urethral meatus, vaginal atrophy noted.  Cervix with a brownish colored mass at 3 p'clock position, non-firable, scant brownish discharge from os.  Pap collected. ?Musculoskeletal: No deformities, no cyanosis or clubbing, normal tone ?Neuro:  A&Ox3, CN II-XII intact, normal gait ?Skin:  Warm, no lesions/ rash ? ? ?Wt Readings from Last 3 Encounters:  ?11/08/21 141 lb 3.2 oz (64 kg)  ?10/23/21 142 lb 9.6 oz (64.7 kg)  ?10/14/21 146 lb 3.2 oz (66.3 kg)  ? ? ?Lab Results  ?Component Value Date  ? WBC 4.6 10/25/2021  ? HGB 13.2 10/25/2021  ?  HCT 39.2 10/25/2021  ? PLT 121.0 (L) 10/25/2021  ? GLUCOSE 167 (H) 10/25/2021  ? CHOL 126 10/25/2021  ? TRIG 76.0 10/25/2021  ? HDL 54.30 10/25/2021  ? LDLDIRECT 95.8 07/02/2006  ? [place] 57 10/25/2021  ? ALT 11 10/25/2021  ? AST 22 10/25/2021  ? NA 138 10/25/2021  ? K 4.1 10/25/2021  ? CL 100 10/25/2021  ? CREATININE 1.11 10/25/2021  ? BUN 15 10/25/2021  ? CO2 30 10/25/2021  ? TSH 0.76 06/05/2021  ? INR 1.1 10/25/2021  ? HGBA1C 6.5 10/14/2021  ? MICROALBUR <0.7 06/08/2019  ? ? ?Assessment/Plan: ? ?Vaginal discharge ?-Given postmenopausal bleeding concern for  malignancy ?-Cervix with abnormal area at 3 o'clock position on exam. ?-We will await results.  If negative consider referral to OB/GYN for endometrial biopsy. ?- Plan: PAP [[place]] ? ?Right upper quadrant abdominal pain ?-Likely 2/2 renal calculi this patient states movement sharp pain similar to previous kidney stone. ?-CT abdomen pelvis on 09/18/2021 reviewed and negative for acute concerns causing patient's current pain.  A 2 mm renal calculi to the midportion of right kidney was noted.  Diverticulosis of colon without signs of focal diverticulitis and a 5.2 cm fat-containing ventral hernia in left paramedian region and suprapubic location also noted.  A 7 mm lipoma in the body of pancreas also mention.  Appendix is not dilated. ?-Patient encouraged to take tramadol as needed.  Explained initial limited narcotic prescription.  Additional pain medication sent to pharmacy if needed. ?- Plan: traMADol (ULTRAM) 50 MG tablet ? ?History of renal calculi ? ?Renal colic ?-Concern pt is passing 2 mm renal calculi noted in midportion of right kidney on CT abdomen pelvis with contrast on 09/18/2021 ?-Continue fluids and Flomax ?-has strainer ?-Advised to use tramadol as needed ? ?F/u as needed ? ?[name], MD ?

## 2021-11-11 ENCOUNTER — Telehealth: Payer: Self-pay | Admitting: Podiatry

## 2021-11-11 NOTE — Telephone Encounter (Signed)
Voicemail full on cell phone. ?Called pts home and she is scheduled  ?To see Dr Elisha Ponder tomorrow. ?

## 2021-11-11 NOTE — Telephone Encounter (Signed)
Patient left voicemail on nurse line. Would like an appointment for painful corn.  ?

## 2021-11-12 ENCOUNTER — Ambulatory Visit: Payer: Medicare HMO | Admitting: Podiatry

## 2021-11-12 ENCOUNTER — Ambulatory Visit (INDEPENDENT_AMBULATORY_CARE_PROVIDER_SITE_OTHER): Payer: Medicare HMO

## 2021-11-12 ENCOUNTER — Encounter: Payer: Self-pay | Admitting: Podiatry

## 2021-11-12 DIAGNOSIS — M2141 Flat foot [pes planus] (acquired), right foot: Secondary | ICD-10-CM | POA: Diagnosis not present

## 2021-11-12 DIAGNOSIS — Q828 Other specified congenital malformations of skin: Secondary | ICD-10-CM | POA: Diagnosis not present

## 2021-11-12 DIAGNOSIS — E1149 Type 2 diabetes mellitus with other diabetic neurological complication: Secondary | ICD-10-CM | POA: Diagnosis not present

## 2021-11-12 DIAGNOSIS — M2142 Flat foot [pes planus] (acquired), left foot: Secondary | ICD-10-CM | POA: Diagnosis not present

## 2021-11-12 DIAGNOSIS — B351 Tinea unguium: Secondary | ICD-10-CM

## 2021-11-12 DIAGNOSIS — M79675 Pain in left toe(s): Secondary | ICD-10-CM

## 2021-11-12 DIAGNOSIS — M79674 Pain in right toe(s): Secondary | ICD-10-CM | POA: Diagnosis not present

## 2021-11-12 NOTE — Progress Notes (Signed)
SITUATION ?Reason for Visit: Fitting of Diabetic [place] ?Patient / Caregiver Report:  Patient is satisfied with fit and function of shoes and insoles. ? ?OBJECTIVE DATA: ?Patient History / Diagnosis:   ?  ICD-10-CM   ?1. Type II diabetes mellitus with neurological manifestations ([place])  E11.49   ?  ?2. Flat foot (pes planus) (acquired), left foot  M21.42   ?  ?3. Flat foot (pes planus) (acquired), right foot  M21.41   ?  ? ? ?Change in Status:   None ? ?ACTIONS PERFORMED: ?In-Person Delivery, patient was fit with: ?- 1x pair A5500 PDAC approved prefabricated Diabetic Shoes: Apex [id number] 8W ?- 3x pair T7017 PDAC approved vacuum formed custom diabetic insoles; [employer]: [id number] ? ?Shoes and insoles were verified for structural integrity and safety. Patient wore shoes and insoles in office. Skin was inspected and free of areas of concern after wearing shoes and inserts. Shoes and inserts fit properly. Patient / Caregiver provided with ferbal instruction and demonstration regarding donning, doffing, wear, care, proper fit, function, purpose, cleaning, and use of shoes and insoles ' and in all related precautions and risks and benefits regarding shoes and insoles. Patient / Caregiver was instructed to wear properly fitting socks with shoes at all times. Patient was also provided with verbal instruction regarding how to report any failures or malfunctions of shoes or inserts, and necessary follow up care. Patient / Caregiver was also instructed to contact physician regarding change in status that may affect function of shoes and inserts.  ? ?Patient / Caregiver verbalized undersatnding of instruction provided. Patient / Caregiver demonstrated independence with proper donning and doffing of shoes and inserts. ? ?PLAN ?Patient to follow with treating physician as recommended. Plan of care was discussed with and agreed upon by patient and/or caregiver. All questions were answered and concerns addressed. ? ?

## 2021-11-13 LAB — CYTOLOGY - PAP
Chlamydia: NEGATIVE
Comment: NEGATIVE
Comment: NEGATIVE
Comment: NEGATIVE
Comment: NORMAL
Diagnosis: NEGATIVE
Diagnosis: REACTIVE
High risk HPV: NEGATIVE
Neisseria Gonorrhea: NEGATIVE
Trichomonas: NEGATIVE

## 2021-11-14 ENCOUNTER — Telehealth: Payer: Self-pay | Admitting: Hematology

## 2021-11-14 ENCOUNTER — Other Ambulatory Visit: Payer: Self-pay | Admitting: Family Medicine

## 2021-11-14 DIAGNOSIS — Z87442 Personal history of urinary calculi: Secondary | ICD-10-CM

## 2021-11-14 DIAGNOSIS — R109 Unspecified abdominal pain: Secondary | ICD-10-CM

## 2021-11-14 NOTE — Telephone Encounter (Signed)
Per provider reschedule called and spoke to pt about appointment change pt confirmed appointment  ?

## 2021-11-15 ENCOUNTER — Encounter: Payer: Self-pay | Admitting: Family Medicine

## 2021-11-20 DIAGNOSIS — H3412 Central retinal artery occlusion, left eye: Secondary | ICD-10-CM | POA: Diagnosis not present

## 2021-11-20 DIAGNOSIS — H18513 Endothelial corneal dystrophy, bilateral: Secondary | ICD-10-CM | POA: Diagnosis not present

## 2021-11-20 DIAGNOSIS — Z961 Presence of intraocular lens: Secondary | ICD-10-CM | POA: Diagnosis not present

## 2021-11-20 DIAGNOSIS — E119 Type 2 diabetes mellitus without complications: Secondary | ICD-10-CM | POA: Diagnosis not present

## 2021-11-20 LAB — HM DIABETES EYE EXAM

## 2021-11-20 NOTE — Progress Notes (Signed)
?  Subjective:  ?Patient ID: Anita Mccormick, female    DOB: Feb 14, 1946,  MRN: [id number] ? ?Anita Mccormick presents to clinic today for at risk foot care with history of diabetic neuropathy and corn(s) bilateral 5th toes and painful thick toenails that are difficult to trim. Painful toenails interfere with ambulation. Aggravating factors include wearing enclosed shoe gear. Pain is relieved with periodic professional debridement. Painful corns are aggravated when weightbearing when wearing enclosed shoe gear. Pain is relieved with periodic professional debridement. ? ?Last HgA1c was around 6%. Patient did not check blood glucose today. ? ?New problem(s): None.  ? ?PCP is Billie Ruddy, MD , and last visit was November 08, 2021. ? ?Allergies  ?Allergen Reactions  ? Ace Inhibitors Cough  ?  ????  ? Chlorthalidone   ?  REACTION: unspecified  ? Metformin   ?  REACTION: gi side effects  ? Penicillins   ?  REACTION: urticaria (hives)  ? Sulfamethoxazole   ?  REACTION: questionable  ? ? ?Review of Systems: Negative except as noted in the HPI. ? ?Objective: No changes noted in today's physical examination. ? ?Objective:  ? ?Vascular Examination: ?CFT <3 seconds b/l LE. Palpable DP pulse(s) b/l LE. Palpable PT pulse(s) b/l LE. Pedal hair sparse. No pain with calf compression b/l. Lower extremity skin temperature gradient within normal limits. No edema noted b/l LE. No cyanosis or clubbing noted b/l LE. ? ?Neurological Examination: ?Pt has subjective symptoms of neuropathy. Protective sensation intact 5/5 intact bilaterally with 10g monofilament b/l. Vibratory sensation intact b/l. ? ?Dermatological Examination: ?Pedal skin with normal turgor, texture and tone b/l. Toenails 1-5 b/l thick, discolored, elongated with subungual debris and pain on dorsal palpation.Porokeratotic lesion(s) lateral aspect bilateral 5th toes. No erythema, no edema, no drainage, no fluctuance. ? ?Musculoskeletal Examination: ?Muscle strength 5/5 to all  lower extremity muscle groups bilaterally. Hammertoe deformity noted 2-5 b/l. ? ?Radiographs: None ? ?Last A1c:   ? ?  Latest Ref Rng & Units 10/14/2021  ?  3:21 PM 06/05/2021  ? 12:22 PM 03/07/2021  ?  3:18 PM  ?Hemoglobin A1C  ?Hemoglobin-A1c 4.6 - 6.5 % 6.5   6.6   6.1    ?  ?Assessment/Plan: ?1. Pain due to onychomycosis of toenails of both feet   ?2. Porokeratosis   ?3. Type II diabetes mellitus with neurological manifestations ([place])   ?  ? ?-Patient was evaluated and treated. All patient's and/or POA's questions/concerns answered on today's visit. ?-Patient to continue soft, supportive shoe gear daily. ?-Mycotic toenails 1-5 bilaterally were debrided in length and girth with sterile nail nippers and dremel without iatrogenic bleeding. ?-Corn(s) bilateral 5th toes pared utilizing sterile scalpel blade without complication or incident. Total number debrided=2. ?-Dispensed toe cap for Lister's corns. Apply to bilateral 5th toes every morning. Remove every evening. ?-Patient/POA to call should there be question/concern in the interim.  ? ?Return in about 3 months (around 02/11/2022). ? ?Marzetta Board, DPM  ?

## 2021-11-21 ENCOUNTER — Telehealth: Payer: Self-pay | Admitting: Family Medicine

## 2021-11-21 NOTE — Telephone Encounter (Signed)
Anita Mccormick from [facility] requesting PA for echocardiogram ?

## 2021-11-22 ENCOUNTER — Other Ambulatory Visit: Payer: Self-pay | Admitting: Family Medicine

## 2021-11-22 ENCOUNTER — Telehealth: Payer: Self-pay | Admitting: Family Medicine

## 2021-11-22 NOTE — Telephone Encounter (Signed)
Dr Katy Fitch has concerns about giant cell arteritis, they are setting up a temporal artery biopsy. Wants Dr Volanda Napoleon to consider putting patient on oral steroids/prednisone ?

## 2021-11-25 ENCOUNTER — Other Ambulatory Visit: Payer: Self-pay

## 2021-11-25 DIAGNOSIS — N898 Other specified noninflammatory disorders of vagina: Secondary | ICD-10-CM

## 2021-11-27 NOTE — Telephone Encounter (Signed)
Waiting on LOV notes from [facility]. ?

## 2021-11-28 ENCOUNTER — Encounter: Payer: Self-pay | Admitting: Vascular Surgery

## 2021-11-28 ENCOUNTER — Other Ambulatory Visit: Payer: Self-pay

## 2021-11-28 ENCOUNTER — Ambulatory Visit: Payer: Medicare HMO | Admitting: Vascular Surgery

## 2021-11-28 ENCOUNTER — Encounter (HOSPITAL_COMMUNITY): Payer: Self-pay | Admitting: Vascular Surgery

## 2021-11-28 VITALS — BP 127/75 | HR 76 | Temp 98.1°F | Resp 20 | Ht 62.0 in | Wt 137.4 lb

## 2021-11-28 DIAGNOSIS — H5462 Unqualified visual loss, left eye, normal vision right eye: Secondary | ICD-10-CM | POA: Diagnosis not present

## 2021-11-28 DIAGNOSIS — M316 Other giant cell arteritis: Secondary | ICD-10-CM

## 2021-11-28 DIAGNOSIS — I6522 Occlusion and stenosis of left carotid artery: Secondary | ICD-10-CM | POA: Diagnosis not present

## 2021-11-28 NOTE — H&P (View-Only) (Signed)
? ?ASSESSMENT & PLAN  ? ?RULE OUT TEMPORAL ARTERITIS: We have been asked to perform a left temporal artery biopsy to rule out temporal arteritis.  Patient developed the sudden onset of loss of vision in the left eye and has an elevated sed rate.  For this reason, temporal arteritis was in the differential diagnosis and we were asked to provide the temporal artery biopsy.  ? ?MILD LEFT CAROTID STENOSIS: The patient was also noted to have a retinal artery occlusion but on CT angio of the neck only had mild disease on the left that was felt to be 30 to 40%.  She does have calcific plaque here and I think this was the very least need close follow-up.  We discussed getting a carotid duplex scan today however she felt strongly about not getting this done today.  She is agreeable to come back for a follow-up visit and have a carotid duplex scan.  If she was found to have a more significant carotid stenosis than suggested by CT angio of the neck the bifurcation is quite high and probably the only option to address that would be transcarotid stenting.  We have discussed the importance of tobacco cessation. ? ?REASON FOR CONSULT:   ? ?To consider temporal artery biopsy.  The consult is requested by Dr. Katy Fitch. ? ?HPI:  ? ?Anita Mccormick is a 76 y.o. female who was referred to be considered for a temporal artery biopsy.  I have reviewed the records from the referring office.  The patient was seen on 11/20/2021.  The patient was noted to have a central retinal artery occlusion it was not expected to have any improvement in vision.  The patient was then to get a stroke work-up.  She had a CT angiogram which showed only a mild left carotid stenosis.  Her sed rate was elevated and for this reason she was sent for temporal artery biopsy. ? ?She had developed the sudden onset of loss of vision in the left eye the end of April.  She tells me that she has had a mini stroke in the remote past but no recent stroke or TIA.  She denies any  focal weakness or paresthesias in her upper extremities or lower extremities.  She did have one episode of slurred speech but her blood sugar was 40 when this occurred. ? ?Her risk factors for peripheral arterial disease include type 2 diabetes, hypertension, and tobacco use. ? ?She does admit to some occasional headaches. ? ?Past Medical History:  ?Diagnosis Date  ? Cancer [facility])   ? history of no adjunctive RX( no chemo no RT)  ? Cerebrovascular accident [facility])   ? COLONIC POLYPS, HX OF 12/10/2006  ? Qualifier: Diagnosis of  By: Leanne Chang MD, Bruce    ? Diabetes mellitus type II, controlled ([place]) 12/10/2006  ? Poor control on Januvia 124m and glipizide 120mXL Lab Results  Component Value Date   HGBA1C 8.3* 11/02/2014       ? Eczema   ? Fatty liver disease, nonalcoholic 1253/29/9242? Per Dr. SwLeanne Changab Results  Component Value Date   ALT 31 11/02/2014   AST 57* 11/02/2014   ALKPHOS 104 11/02/2014   BILITOT 1.1 11/02/2014      ? Hypertension   ? ? ?Family History  ?Problem Relation Age of Onset  ? Stroke Mother   ? Cancer Father   ?     prostate  ? Diabetes Sister   ? Pancreatic cancer Sister   ? ? ?  SOCIAL HISTORY: ?Social History  ? ?Tobacco Use  ? Smoking status: Some Days  ?  Packs/day: 0.10  ?  Years: 10.00  ?  Pack years: 1.00  ?  Types: Cigarettes  ? Smokeless tobacco: Never  ? Tobacco comments:  ?  3 cigs a day :not smoking due to illness (01/17/17)  ?Substance Use Topics  ? Alcohol use: Yes  ?  Comment: a drink every now and again  ? ? ?Allergies  ?Allergen Reactions  ? Ace Inhibitors Cough  ?  ????  ? Chlorthalidone   ?  REACTION: unspecified  ? Metformin   ?  REACTION: gi side effects  ? Penicillins   ?  REACTION: urticaria (hives)  ? Sulfamethoxazole   ?  REACTION: questionable  ? ? ?Current Outpatient Medications  ?Medication Sig Dispense Refill  ? ACCU-CHEK GUIDE test strip TEST UP TO 4 TIMES A DAY 100 strip 1  ? atenolol (TENORMIN) 100 MG tablet TAKE 1 TABLET BY MOUTH TWICE A DAY 180 tablet 1  ? blood  glucose meter kit and supplies KIT Dispense based on patient and insurance preference. Use up to four times daily as directed. 1 each 0  ? Blood Glucose Monitoring Suppl (ONETOUCH ULTRALINK) w/Device KIT Test twice daily (onetouch ultra) 1 each 0  ? Blood Pressure Monitoring (BLOOD PRESSURE CUFF) MISC Use daily to monitor blood pressue 1 each 0  ? Calcium Carbonate-Vitamin D (CALCIUM PLUS VITAMIN D) 600-100 MG-UNIT CAPS Take 1 capsule by mouth 2 (two) times daily.    ? Continuous Blood Gluc Receiver (FREESTYLE LIBRE READER) DEVI 1 Device by Does not apply route 4 (four) times daily as needed. 1 each 0  ? Continuous Blood Gluc Sensor (FREESTYLE LIBRE 14 DAY SENSOR) MISC 1 Device by Does not apply route every 14 (fourteen) days. 2 each 5  ? ferrous gluconate (FERGON) 324 MG tablet Take by mouth.    ? gabapentin (NEURONTIN) 100 MG capsule Take 100 mg by mouth at bedtime.    ? glipiZIDE (GLUCOTROL XL) 10 MG 24 hr tablet Take 1 tablet (10 mg total) by mouth 2 (two) times daily. 180 tablet 1  ? glucose blood (ONETOUCH ULTRA) test strip Use as instructed for blood sugar testing 3 times daily. 100 each 11  ? glucose blood test strip 1 each by Other route 4 (four) times daily as needed for other. Use as instructed 100 each 12  ? Lancets (ONETOUCH ULTRASOFT) lancets Use as instructed 100 each 11  ? pantoprazole (PROTONIX) 40 MG tablet Take 1 tablet by mouth daily.    ? tamsulosin (FLOMAX) 0.4 MG CAPS capsule TAKE 1 CAPSULE BY MOUTH EVERY DAY 30 capsule 0  ? traMADol (ULTRAM) 50 MG tablet Take 1 tablet (50 mg total) by mouth every 12 (twelve) hours as needed. 60 tablet 0  ? TRULICITY 1.5 LS/9.3TD SOPN INJECT 1.5 MG INTO THE SKIN ONCE A WEEK. 2 mL 9  ? furosemide (LASIX) 20 MG tablet Take 1 tablet (20 mg total) by mouth daily for 3 days. 3 tablet 0  ? ?No current facility-administered medications for this visit.  ? ? ?REVIEW OF SYSTEMS:  ?_0  denotes positive finding, _1  denotes negative finding ?Cardiac  Comments:  ?Chest  pain or chest pressure:    ?Shortness of breath upon exertion:    ?Short of breath when lying flat:    ?Irregular heart rhythm:    ?    ?Vascular    ?Pain in calf, thigh, or hip brought  on by ambulation:    ?Pain in feet at night that wakes you up from your sleep:     ?Blood clot in your veins:    ?Leg swelling:     ?    ?Pulmonary    ?Oxygen at home:    ?Productive cough:     ?Wheezing:     ?    ?Neurologic    ?Sudden weakness in arms or legs:     ?Sudden numbness in arms or legs:     ?Sudden onset of difficulty speaking or slurred speech:    ?Temporary loss of vision in one eye:  x   ?Problems with dizziness:     ?    ?Gastrointestinal    ?Blood in stool:     ?Vomited blood:     ?    ?Genitourinary    ?Burning when urinating:     ?Blood in urine:    ?    ?Psychiatric    ?Major depression:     ?    ?Hematologic    ?Bleeding problems:    ?Problems with blood clotting too easily:    ?    ?Skin    ?Rashes or ulcers:    ?    ?Constitutional    ?Fever or chills:    ?- ? ?PHYSICAL EXAM:  ? ?Vitals:  ? 11/28/21 0845  ?BP: 127/75  ?Pulse: 76  ?Resp: 20  ?Temp: 98.1 ?F (36.7 ?C)  ?SpO2: 93%  ?Weight: 137 lb 6.4 oz (62.3 kg)  ?Height: _0  (1.575 m)  ? ?Body mass index is 25.13 kg/m?. ?GENERAL: The patient is a well-nourished female, in no acute distress. The vital signs are documented above. ?CARDIAC: There is a regular rate and rhythm.  ?VASCULAR: I do not detect carotid bruits. ?She has a palpable left temporal pulse. ?Both feet are warm and well-perfused. ?She has no significant lower extremity swelling. ?PULMONARY: There is good air exchange bilaterally without wheezing or rales. ?ABDOMEN: Soft and non-tender with normal pitched bowel sounds.  ?MUSCULOSKELETAL: There are no major deformities. ?NEUROLOGIC: No focal weakness or paresthesias are detected. ?SKIN: There are no ulcers or rashes noted. ?PSYCHIATRIC: The patient has a normal affect. ? ?DATA:   ? ?CT ANGIOGRAM HEAD AND NECK: The patient underwent a CT angiogram  of the head and neck on 10/30/2021.  The CT of the head did not show any evidence of acute intracranial hemorrhage or acute infarct.  CT angiogram of the neck showed a 30 to 40% stenosis on the left with no

## 2021-11-28 NOTE — Progress Notes (Signed)
Anesthesia Chart Review: ?Same-day work-up ? ?Previously seen by cardiology 2019 for evaluation of diastolic CHF.  Echo at that time showed EF 6065%, grade 1 DD, no significant valvular abnormalities.  Nuclear stress was low risk, nonischemic.  Patient also had recent echo November 2022 during admission for symptomatic anemia which showed EF 65%, grade 1 DD, possible PFO, no significant valvular abnormalities. ? ?Patient recently suffered vision loss in left eye on 10/23/2021 and subsequently diagnosed with central artery occlusion.  She was noted to have an elevated sed rate and was referred to vascular surgery for temporal artery biopsy. ? ?Patient will need day of surgery labs and evaluation. ? ?EKG 10/25/2021: NSR.  Rate 86.  Possible LAE.  LVH with repolarization abnormality. ? ?CTA head neck 10/30/2021: ?IMPRESSION: ?1. No evidence of acute intracranial pathology. Probable small ?remote lacunar infarct in the left frontal lobe subcortical white ?matter on a background of chronic white matter microangiopathy. ?2. Calcified atherosclerotic plaque at the bilateral carotid ?bifurcations resulting in approximately 30-40% stenosis on the left ?and no hemodynamically significant stenosis on the right. Mild ?plaque at the origins of the vertebral arteries without ?hemodynamically significant stenosis. ?3. Dense calcification of the intracranial ICAs resulting in ?moderate stenosis of the supraclinoid segments bilaterally. The ?ophthalmic arteries are identified bilaterally. ?4. Otherwise, patent intracranial vasculature without ?hemodynamically significant stenosis or occlusion. ?  ? ?TTE 06/07/2021: ? 1. Basal septal hypertrophy can be seen with aging. Left ventricular  ?ejection fraction, by estimation, is 60 to 65%. The left ventricle has  ?normal function. The left ventricle has no regional wall motion  ?abnormalities. There is mild left ventricular  ?hypertrophy. Left ventricular diastolic parameters are consistent  with  ?Grade I diastolic dysfunction (impaired relaxation).  ? 2. Right ventricular systolic function is normal. The right ventricular  ?size is normal. Tricuspid regurgitation signal is inadequate for assessing  ?PA pressure.  ? 3. Possible PFO with doppler, hypermobile septum.  ? 4. The mitral valve is normal in structure. Trivial mitral valve  ?regurgitation.  ? 5. The aortic valve is abnormal. Aortic valve regurgitation is not  ?visualized. Aortic valve sclerosis/calcification is present, without any  ?evidence of aortic stenosis.  ? 6. The inferior vena cava is normal in size with greater than 50%  ?respiratory variability, suggesting right atrial pressure of 3 mmHg.  ? ?Conclusion(s)/Recommendation(s): Possible PFO by doppler, consider TEE if  ?clinically indicated (hx of stroke). Compared to prior echo, no  ?significant changes 08/11/2017. ? ?Nuclear stress 08/26/2017: ?The left ventricular ejection fraction is hyperdynamic (>65%). ?Nuclear stress EF: 70%. ?There was no ST segment deviation noted during stress. ?No T wave inversion was noted during stress. ?The study is normal. ?This is a low risk study. ? ? ? ?Karoline Caldwell, PA-C ?[facility]/Anesthesiology ?Phone [phone] ?11/28/2021 3:34 PM ? ?

## 2021-11-28 NOTE — Anesthesia Preprocedure Evaluation (Addendum)
Anesthesia Evaluation  ?Patient identified by MRN, date of birth, ID band ?Patient awake ? ? ? ?Reviewed: ?Allergy & Precautions, NPO status , Patient's Chart, lab work & pertinent test results ? ?History of Anesthesia Complications ?Negative for: history of anesthetic complications ? ?Airway ?Mallampati: II ? ?TM Distance: >3 FB ?Neck ROM: Full ? ? ? Dental ? ?(+) Dental Advisory Given ?  ?Pulmonary ?neg shortness of breath, neg COPD, neg recent URI, Current Smoker and Patient abstained from smoking.,  ?  ?breath sounds clear to auscultation ? ? ? ? ? ? Cardiovascular ?hypertension, Pt. on medications and Pt. on home beta blockers ?+CHF  ? ?Rhythm:Regular  ?1. Basal septal hypertrophy can be seen with aging. Left ventricular  ?ejection fraction, by estimation, is 60 to 65%. The left ventricle has  ?normal function. The left ventricle has no regional wall motion  ?abnormalities. There is mild left ventricular  ?hypertrophy. Left ventricular diastolic parameters are consistent with  ?Grade I diastolic dysfunction (impaired relaxation).  ??2. Right ventricular systolic function is normal. The right ventricular  ?size is normal. Tricuspid regurgitation signal is inadequate for assessing  ?PA pressure.  ??3. Possible PFO with doppler, hypermobile septum.  ??4. The mitral valve is normal in structure. Trivial mitral valve  ?regurgitation.  ??5. The aortic valve is abnormal. Aortic valve regurgitation is not  ?visualized. Aortic valve sclerosis/calcification is present, without any  ?evidence of aortic stenosis.  ??6. The inferior vena cava is normal in size with greater than 50%  ?respiratory variability, suggesting right atrial pressure of 3 mmHg.  ? ? ?? The left ventricular ejection fraction is hyperdynamic (>65%). ?? Nuclear stress EF: 70%. ?? There was no ST segment deviation noted during stress. ?? No T wave inversion was noted during stress. ?? The study is normal. ?? This is a  low risk study. ?  ? ?  ?Neuro/Psych ? Headaches, neg Seizures CVA, No Residual Symptoms   ? GI/Hepatic ?Neg liver ROS, GERD  Medicated and Controlled,  ?Endo/Other  ?diabetes ? Renal/GU ?CRFRenal diseaseLab Results ?     Component                Value               Date                 ?     CREATININE               1.20 (H)            11/29/2021           ?  ? ?  ?Musculoskeletal ? ?(+) Arthritis ,  ? Abdominal ?  ?Peds ? Hematology ?negative hematology ROS ?(+) Lab Results ?     Component                Value               Date                 ?     WBC                      4.6                 10/25/2021           ?     HGB  12.9                11/29/2021           ?     HCT                      38.0                11/29/2021           ?     MCV                      102.1 (H)           10/25/2021           ?     PLT                      121.0 (L)           10/25/2021           ?   ?Anesthesia Other Findings ? ? Reproductive/Obstetrics ? ?  ? ? ? ? ? ? ? ? ? ? ? ? ? ?  ?  ? ? ? ? ? ? ? ?Anesthesia Physical ?Anesthesia Plan ? ?ASA: 3 ? ?Anesthesia Plan: MAC  ? ?Post-op Pain Management: Minimal or no pain anticipated  ? ?Induction: Intravenous ? ?PONV Risk Score and Plan: 1 and Propofol infusion and Treatment may vary due to age or medical condition ? ?Airway Management Planned: Nasal Cannula ? ?Additional Equipment: None ? ?Intra-op Plan:  ? ?Post-operative Plan:  ? ?Informed Consent: I have reviewed the patients History and Physical, chart, labs and discussed the procedure including the risks, benefits and alternatives for the proposed anesthesia with the patient or authorized representative who has indicated his/her understanding and acceptance.  ? ? ? ?Dental advisory given ? ?Plan Discussed with: CRNA ? ?Anesthesia Plan Comments: (PAT note by Karoline Caldwell, PA-C: ?Previously seen by cardiology 2019 for evaluation of diastolic CHF.  Echo at that time showed EF 6065%, grade 1 DD, no significant  valvular abnormalities.  Nuclear stress was low risk, nonischemic.  Patient also had recent echo November 2022 during admission for symptomatic anemia which showed EF 65%, grade 1 DD, possible PFO, no significant valvular abnormalities. ? ?Patient recently suffered vision loss in left eye on 10/23/2021 and subsequently diagnosed with central artery occlusion.  She was noted to have an elevated sed rate and was referred to vascular surgery for temporal artery biopsy. ? ?Patient will need day of surgery labs and evaluation. ? ?EKG 10/25/2021: NSR.  Rate 86.  Possible LAE.  LVH with repolarization abnormality. ? ?CTA head neck 10/30/2021: ?IMPRESSION: ?1. No evidence of acute intracranial pathology. Probable small ?remote lacunar infarct in the left frontal lobe subcortical white ?matter on a background of chronic white matter microangiopathy. ?2. Calcified atherosclerotic plaque at the bilateral carotid ?bifurcations resulting in approximately 30-40% stenosis on the left ?and no hemodynamically significant stenosis on the right. Mild ?plaque at the origins of the vertebral arteries without ?hemodynamically significant stenosis. ?3. Dense calcification of the intracranial ICAs resulting in ?moderate stenosis of the supraclinoid segments bilaterally. The ?ophthalmic arteries are identified bilaterally. ?4. Otherwise, patent intracranial vasculature without ?hemodynamically significant stenosis or occlusion. ?? ? ?TTE 06/07/2021: ??1. Basal septal hypertrophy can be seen with aging. Left ventricular  ?ejection fraction, by estimation, is 60 to 65%. The left ventricle has  ?normal function.  The left ventricle has no regional wall motion  ?abnormalities. There is mild left ventricular  ?hypertrophy. Left ventricular diastolic parameters are consistent with  ?Grade I diastolic dysfunction (impaired relaxation).  ??2. Right ventricular systolic function is normal. The right ventricular  ?size is normal. Tricuspid regurgitation  signal is inadequate for assessing  ?PA pressure.  ??3. Possible PFO with doppler, hypermobile septum.  ??4. The mitral valve is normal in structure. Trivial mitral valve  ?regurgitation.  ??5. The aortic valve is abnormal. Aortic valve regurgitation is not  ?visualized. Aortic valve sclerosis/calcification is present, without any  ?evidence of aortic stenosis.  ??6. The inferior vena cava is normal in size with greater than 50%  ?respiratory variability, suggesting right atrial pressure of 3 mmHg.  ? ?Conclusion(s)/Recommendation(s): Possible PFO by doppler, consider TEE if  ?clinically indicated (hx of stroke). Compared to prior echo, no  ?significant changes 08/11/2017. ? ?Nuclear stress 08/26/2017: ?? The left ventricular ejection fraction is hyperdynamic (>65%). ?? Nuclear stress EF: 70%. ?? There was no ST segment deviation noted during stress. ?? No T wave inversion was noted during stress. ?? The study is normal. ?? This is a low risk study. ?)  ? ? ? ? ? ?Anesthesia Quick Evaluation ? ?

## 2021-11-28 NOTE — Progress Notes (Addendum)
Anita Mccormick denies shortness of breath or chest pain. Patient denies having any s/s of Covid in her household.  Patient denies any known exposure to Covid.  ? ?PCP: Dr. [name]. ?Anita Mccormick saw a cardiologist in the past, but has not seen one since 2019. ? ?I instructed  Anita Mccormick  to shower with antibacteria soap. No nail polish, artificial or acrylic nails. Wear clean clothes, brush your teeth. ?Glasses, contact lens,dentures or partials may not be worn in the OR. If you need to wear them, please bring a case for glasses, do not wear contacts or bring a case, the hospital does not have contact cases, dentures or partials will have to be removed , make sure they are clean, we will provide a denture cup to put them in. You will need some one to drive you home and a responsible person over the age of 19 to stay with you for the first 24 hours after surgery.  ? ?I asked anesthesiology PA- C to review. ?

## 2021-11-28 NOTE — Progress Notes (Signed)
? ?ASSESSMENT & PLAN  ? ?RULE OUT TEMPORAL ARTERITIS: We have been asked to perform a left temporal artery biopsy to rule out temporal arteritis.  Patient developed the sudden onset of loss of vision in the left eye and has an elevated sed rate.  For this reason, temporal arteritis was in the differential diagnosis and we were asked to provide the temporal artery biopsy.  ? ?MILD LEFT CAROTID STENOSIS: The patient was also noted to have a retinal artery occlusion but on CT angio of the neck only had mild disease on the left that was felt to be 30 to 40%.  She does have calcific plaque here and I think this was the very least need close follow-up.  We discussed getting a carotid duplex scan today however she felt strongly about not getting this done today.  She is agreeable to come back for a follow-up visit and have a carotid duplex scan.  If she was found to have a more significant carotid stenosis than suggested by CT angio of the neck the bifurcation is quite high and probably the only option to address that would be transcarotid stenting.  We have discussed the importance of tobacco cessation. ? ?REASON FOR CONSULT:   ? ?To consider temporal artery biopsy.  The consult is requested by Dr. Groat. ? ?HPI:  ? ?Anita Mccormick is a 76 y.o. female who was referred to be considered for a temporal artery biopsy.  I have reviewed the records from the referring office.  The patient was seen on 11/20/2021.  The patient was noted to have a central retinal artery occlusion it was not expected to have any improvement in vision.  The patient was then to get a stroke work-up.  She had a CT angiogram which showed only a mild left carotid stenosis.  Her sed rate was elevated and for this reason she was sent for temporal artery biopsy. ? ?She had developed the sudden onset of loss of vision in the left eye the end of April.  She tells me that she has had a mini stroke in the remote past but no recent stroke or TIA.  She denies any  focal weakness or paresthesias in her upper extremities or lower extremities.  She did have one episode of slurred speech but her blood sugar was 40 when this occurred. ? ?Her risk factors for peripheral arterial disease include type 2 diabetes, hypertension, and tobacco use. ? ?She does admit to some occasional headaches. ? ?Past Medical History:  ?Diagnosis Date  ? Cancer (HCC)   ? history of no adjunctive RX( no chemo no RT)  ? Cerebrovascular accident (HCC)   ? COLONIC POLYPS, HX OF 12/10/2006  ? Qualifier: Diagnosis of  By: Swords MD, Bruce    ? Diabetes mellitus type II, controlled (HCC) 12/10/2006  ? Poor control on Januvia 100mg and glipizide 10mg XL Lab Results  Component Value Date   HGBA1C 8.3* 11/02/2014       ? Eczema   ? Fatty liver disease, nonalcoholic 07/14/2014  ? Per Dr. Swords Lab Results  Component Value Date   ALT 31 11/02/2014   AST 57* 11/02/2014   ALKPHOS 104 11/02/2014   BILITOT 1.1 11/02/2014      ? Hypertension   ? ? ?Family History  ?Problem Relation Age of Onset  ? Stroke Mother   ? Cancer Father   ?     prostate  ? Diabetes Sister   ? Pancreatic cancer Sister   ? ? ?  SOCIAL HISTORY: ?Social History  ? ?Tobacco Use  ? Smoking status: Some Days  ?  Packs/day: 0.10  ?  Years: 10.00  ?  Pack years: 1.00  ?  Types: Cigarettes  ? Smokeless tobacco: Never  ? Tobacco comments:  ?  3 cigs a day :not smoking due to illness (01/17/17)  ?Substance Use Topics  ? Alcohol use: Yes  ?  Comment: a drink every now and again  ? ? ?Allergies  ?Allergen Reactions  ? Ace Inhibitors Cough  ?  ????  ? Chlorthalidone   ?  REACTION: unspecified  ? Metformin   ?  REACTION: gi side effects  ? Penicillins   ?  REACTION: urticaria (hives)  ? Sulfamethoxazole   ?  REACTION: questionable  ? ? ?Current Outpatient Medications  ?Medication Sig Dispense Refill  ? ACCU-CHEK GUIDE test strip TEST UP TO 4 TIMES A DAY 100 strip 1  ? atenolol (TENORMIN) 100 MG tablet TAKE 1 TABLET BY MOUTH TWICE A DAY 180 tablet 1  ? blood  glucose meter kit and supplies KIT Dispense based on patient and insurance preference. Use up to four times daily as directed. 1 each 0  ? Blood Glucose Monitoring Suppl (ONETOUCH ULTRALINK) w/Device KIT Test twice daily (onetouch ultra) 1 each 0  ? Blood Pressure Monitoring (BLOOD PRESSURE CUFF) MISC Use daily to monitor blood pressue 1 each 0  ? Calcium Carbonate-Vitamin D (CALCIUM PLUS VITAMIN D) 600-100 MG-UNIT CAPS Take 1 capsule by mouth 2 (two) times daily.    ? Continuous Blood Gluc Receiver (FREESTYLE LIBRE READER) DEVI 1 Device by Does not apply route 4 (four) times daily as needed. 1 each 0  ? Continuous Blood Gluc Sensor (FREESTYLE LIBRE 14 DAY SENSOR) MISC 1 Device by Does not apply route every 14 (fourteen) days. 2 each 5  ? ferrous gluconate (FERGON) 324 MG tablet Take by mouth.    ? gabapentin (NEURONTIN) 100 MG capsule Take 100 mg by mouth at bedtime.    ? glipiZIDE (GLUCOTROL XL) 10 MG 24 hr tablet Take 1 tablet (10 mg total) by mouth 2 (two) times daily. 180 tablet 1  ? glucose blood (ONETOUCH ULTRA) test strip Use as instructed for blood sugar testing 3 times daily. 100 each 11  ? glucose blood test strip 1 each by Other route 4 (four) times daily as needed for other. Use as instructed 100 each 12  ? Lancets (ONETOUCH ULTRASOFT) lancets Use as instructed 100 each 11  ? pantoprazole (PROTONIX) 40 MG tablet Take 1 tablet by mouth daily.    ? tamsulosin (FLOMAX) 0.4 MG CAPS capsule TAKE 1 CAPSULE BY MOUTH EVERY DAY 30 capsule 0  ? traMADol (ULTRAM) 50 MG tablet Take 1 tablet (50 mg total) by mouth every 12 (twelve) hours as needed. 60 tablet 0  ? TRULICITY 1.5 MG/0.5ML SOPN INJECT 1.5 MG INTO THE SKIN ONCE A WEEK. 2 mL 9  ? furosemide (LASIX) 20 MG tablet Take 1 tablet (20 mg total) by mouth daily for 3 days. 3 tablet 0  ? ?No current facility-administered medications for this visit.  ? ? ?REVIEW OF SYSTEMS:  ?[X] denotes positive finding, [ ] denotes negative finding ?Cardiac  Comments:  ?Chest  pain or chest pressure:    ?Shortness of breath upon exertion:    ?Short of breath when lying flat:    ?Irregular heart rhythm:    ?    ?Vascular    ?Pain in calf, thigh, or hip brought   on by ambulation:    ?Pain in feet at night that wakes you up from your sleep:     ?Blood clot in your veins:    ?Leg swelling:     ?    ?Pulmonary    ?Oxygen at home:    ?Productive cough:     ?Wheezing:     ?    ?Neurologic    ?Sudden weakness in arms or legs:     ?Sudden numbness in arms or legs:     ?Sudden onset of difficulty speaking or slurred speech:    ?Temporary loss of vision in one eye:  x   ?Problems with dizziness:     ?    ?Gastrointestinal    ?Blood in stool:     ?Vomited blood:     ?    ?Genitourinary    ?Burning when urinating:     ?Blood in urine:    ?    ?Psychiatric    ?Major depression:     ?    ?Hematologic    ?Bleeding problems:    ?Problems with blood clotting too easily:    ?    ?Skin    ?Rashes or ulcers:    ?    ?Constitutional    ?Fever or chills:    ?- ? ?PHYSICAL EXAM:  ? ?Vitals:  ? 11/28/21 0845  ?BP: 127/75  ?Pulse: 76  ?Resp: 20  ?Temp: 98.1 ?F (36.7 ?C)  ?SpO2: 93%  ?Weight: 137 lb 6.4 oz (62.3 kg)  ?Height: 5' 2" (1.575 m)  ? ?Body mass index is 25.13 kg/m?. ?GENERAL: The patient is a well-nourished female, in no acute distress. The vital signs are documented above. ?CARDIAC: There is a regular rate and rhythm.  ?VASCULAR: I do not detect carotid bruits. ?She has a palpable left temporal pulse. ?Both feet are warm and well-perfused. ?She has no significant lower extremity swelling. ?PULMONARY: There is good air exchange bilaterally without wheezing or rales. ?ABDOMEN: Soft and non-tender with normal pitched bowel sounds.  ?MUSCULOSKELETAL: There are no major deformities. ?NEUROLOGIC: No focal weakness or paresthesias are detected. ?SKIN: There are no ulcers or rashes noted. ?PSYCHIATRIC: The patient has a normal affect. ? ?DATA:   ? ?CT ANGIOGRAM HEAD AND NECK: The patient underwent a CT angiogram  of the head and neck on 10/30/2021.  The CT of the head did not show any evidence of acute intracranial hemorrhage or acute infarct.  CT angiogram of the neck showed a 30 to 40% stenosis on the left with no

## 2021-11-29 ENCOUNTER — Other Ambulatory Visit: Payer: Self-pay

## 2021-11-29 ENCOUNTER — Encounter (HOSPITAL_COMMUNITY): Payer: Self-pay | Admitting: Vascular Surgery

## 2021-11-29 ENCOUNTER — Ambulatory Visit (HOSPITAL_COMMUNITY): Payer: Medicare HMO | Admitting: Physician Assistant

## 2021-11-29 ENCOUNTER — Ambulatory Visit (HOSPITAL_COMMUNITY)
Admission: RE | Admit: 2021-11-29 | Discharge: 2021-11-29 | Disposition: A | Discharge status: Home / Self Care | Payer: Medicare HMO | Source: Ambulatory Visit | Attending: Vascular Surgery | Admitting: Vascular Surgery

## 2021-11-29 ENCOUNTER — Ambulatory Visit (HOSPITAL_BASED_OUTPATIENT_CLINIC_OR_DEPARTMENT_OTHER): Payer: Medicare HMO | Admitting: Physician Assistant

## 2021-11-29 ENCOUNTER — Encounter (HOSPITAL_COMMUNITY)
Admission: RE | Disposition: A | Discharge status: Home / Self Care | Payer: Self-pay | Source: Ambulatory Visit | Attending: Vascular Surgery

## 2021-11-29 DIAGNOSIS — I509 Heart failure, unspecified: Secondary | ICD-10-CM | POA: Insufficient documentation

## 2021-11-29 DIAGNOSIS — N189 Chronic kidney disease, unspecified: Secondary | ICD-10-CM | POA: Diagnosis not present

## 2021-11-29 DIAGNOSIS — I130 Hypertensive heart and chronic kidney disease with heart failure and stage 1 through stage 4 chronic kidney disease, or unspecified chronic kidney disease: Secondary | ICD-10-CM

## 2021-11-29 DIAGNOSIS — Z8673 Personal history of transient ischemic attack (TIA), and cerebral infarction without residual deficits: Secondary | ICD-10-CM | POA: Diagnosis not present

## 2021-11-29 DIAGNOSIS — I110 Hypertensive heart disease with heart failure: Secondary | ICD-10-CM | POA: Diagnosis not present

## 2021-11-29 DIAGNOSIS — I672 Cerebral atherosclerosis: Secondary | ICD-10-CM | POA: Diagnosis not present

## 2021-11-29 DIAGNOSIS — M316 Other giant cell arteritis: Secondary | ICD-10-CM | POA: Diagnosis not present

## 2021-11-29 DIAGNOSIS — H5462 Unqualified visual loss, left eye, normal vision right eye: Secondary | ICD-10-CM | POA: Diagnosis not present

## 2021-11-29 DIAGNOSIS — R700 Elevated erythrocyte sedimentation rate: Secondary | ICD-10-CM | POA: Insufficient documentation

## 2021-11-29 DIAGNOSIS — E1122 Type 2 diabetes mellitus with diabetic chronic kidney disease: Secondary | ICD-10-CM | POA: Diagnosis not present

## 2021-11-29 DIAGNOSIS — F17210 Nicotine dependence, cigarettes, uncomplicated: Secondary | ICD-10-CM | POA: Diagnosis not present

## 2021-11-29 DIAGNOSIS — E119 Type 2 diabetes mellitus without complications: Secondary | ICD-10-CM | POA: Insufficient documentation

## 2021-11-29 DIAGNOSIS — K219 Gastro-esophageal reflux disease without esophagitis: Secondary | ICD-10-CM | POA: Diagnosis not present

## 2021-11-29 DIAGNOSIS — Z87891 Personal history of nicotine dependence: Secondary | ICD-10-CM

## 2021-11-29 HISTORY — DX: Gastro-esophageal reflux disease without esophagitis: K21.9

## 2021-11-29 HISTORY — PX: ARTERY BIOPSY: SHX891

## 2021-11-29 HISTORY — DX: Unspecified osteoarthritis, unspecified site: M19.90

## 2021-11-29 HISTORY — DX: Personal history of other medical treatment: Z92.89

## 2021-11-29 LAB — POCT I-STAT, CHEM 8
BUN: 8 mg/dL (ref 8–23)
Calcium, Ion: 1.11 mmol/L — ABNORMAL LOW (ref 1.15–1.40)
Chloride: 105 mmol/L (ref 98–111)
Creatinine, Ser: 1.2 mg/dL — ABNORMAL HIGH (ref 0.44–1.00)
Glucose, Bld: 137 mg/dL — ABNORMAL HIGH (ref 70–99)
HCT: 38 % (ref 36.0–46.0)
Hemoglobin: 12.9 g/dL (ref 12.0–15.0)
Potassium: 4.1 mmol/L (ref 3.5–5.1)
Sodium: 138 mmol/L (ref 135–145)
TCO2: 26 mmol/L (ref 22–32)

## 2021-11-29 LAB — GLUCOSE, CAPILLARY
Glucose-Capillary: 147 mg/dL — ABNORMAL HIGH (ref 70–99)
Glucose-Capillary: 189 mg/dL — ABNORMAL HIGH (ref 70–99)

## 2021-11-29 SURGERY — BIOPSY TEMPORAL ARTERY
Anesthesia: General | Site: Head | Laterality: Left

## 2021-11-29 MED ORDER — LIDOCAINE 2% (20 MG/ML) 5 ML SYRINGE
INTRAMUSCULAR | Status: DC | PRN
  Administered 2021-11-29: 60 mg via INTRAVENOUS

## 2021-11-29 MED ORDER — ONDANSETRON HCL 4 MG/2ML IJ SOLN
INTRAMUSCULAR | Status: DC | PRN
  Administered 2021-11-29: 4 mg via INTRAVENOUS

## 2021-11-29 MED ORDER — PHENYLEPHRINE HCL-NACL 20-0.9 MG/250ML-% IV SOLN
INTRAVENOUS | Status: DC | PRN
  Administered 2021-11-29: 25 ug/min via INTRAVENOUS

## 2021-11-29 MED ORDER — LIDOCAINE HCL (PF) 1 % IJ SOLN
INTRAMUSCULAR | Status: DC | PRN
  Administered 2021-11-29: 4 mL

## 2021-11-29 MED ORDER — FENTANYL CITRATE (PF) 250 MCG/5ML IJ SOLN
INTRAMUSCULAR | Status: DC | PRN
  Administered 2021-11-29: 50 ug via INTRAVENOUS

## 2021-11-29 MED ORDER — LIDOCAINE 2% (20 MG/ML) 5 ML SYRINGE
INTRAMUSCULAR | Status: AC
  Filled 2021-11-29: qty 5

## 2021-11-29 MED ORDER — VANCOMYCIN HCL IN DEXTROSE 1-5 GM/200ML-% IV SOLN
1000.0000 mg | INTRAVENOUS | Status: AC
  Administered 2021-11-29: 1000 mg via INTRAVENOUS
  Filled 2021-11-29: qty 200

## 2021-11-29 MED ORDER — CHLORHEXIDINE GLUCONATE 4 % EX LIQD: 60.0000 mL | Freq: Once | CUTANEOUS | Status: DC

## 2021-11-29 MED ORDER — CHLORHEXIDINE GLUCONATE 0.12 % MT SOLN
OROMUCOSAL | Status: AC
  Filled 2021-11-29: qty 15

## 2021-11-29 MED ORDER — VASOPRESSIN 20 UNIT/ML IV SOLN
INTRAVENOUS | Status: DC | PRN
  Administered 2021-11-29 (×4): 1 [IU] via INTRAVENOUS

## 2021-11-29 MED ORDER — VASOPRESSIN 20 UNIT/ML IV SOLN
INTRAVENOUS | Status: AC
  Filled 2021-11-29: qty 1

## 2021-11-29 MED ORDER — SUGAMMADEX SODIUM 200 MG/2ML IV SOLN
INTRAVENOUS | Status: DC | PRN
  Administered 2021-11-29: 200 mg via INTRAVENOUS

## 2021-11-29 MED ORDER — ROCURONIUM BROMIDE 10 MG/ML (PF) SYRINGE
PREFILLED_SYRINGE | INTRAVENOUS | Status: AC
  Filled 2021-11-29: qty 10

## 2021-11-29 MED ORDER — ROCURONIUM BROMIDE 100 MG/10ML IV SOLN
INTRAVENOUS | Status: DC | PRN
Start: 2021-11-29 — End: 2021-11-29
  Administered 2021-11-29: 50 mg via INTRAVENOUS

## 2021-11-29 MED ORDER — 0.9 % SODIUM CHLORIDE (POUR BTL) OPTIME
TOPICAL | Status: DC | PRN
  Administered 2021-11-29: 1000 mL

## 2021-11-29 MED ORDER — PROPOFOL 10 MG/ML IV BOLUS
INTRAVENOUS | Status: AC
  Filled 2021-11-29: qty 20

## 2021-11-29 MED ORDER — PROPOFOL 500 MG/50ML IV EMUL
INTRAVENOUS | Status: DC | PRN
  Administered 2021-11-29: 100 ug/kg/min via INTRAVENOUS

## 2021-11-29 MED ORDER — PROPOFOL 10 MG/ML IV BOLUS
INTRAVENOUS | Status: DC | PRN
  Administered 2021-11-29: 25 mg via INTRAVENOUS

## 2021-11-29 MED ORDER — INSULIN ASPART 100 UNIT/ML IJ SOLN: 0.0000 [IU] | INTRAMUSCULAR | Status: DC | PRN

## 2021-11-29 MED ORDER — LIDOCAINE HCL (PF) 1 % IJ SOLN
INTRAMUSCULAR | Status: AC
  Filled 2021-11-29: qty 30

## 2021-11-29 MED ORDER — PHENYLEPHRINE 80 MCG/ML (10ML) SYRINGE FOR IV PUSH (FOR BLOOD PRESSURE SUPPORT)
PREFILLED_SYRINGE | INTRAVENOUS | Status: DC | PRN
  Administered 2021-11-29: 80 ug via INTRAVENOUS
  Administered 2021-11-29 (×2): 160 ug via INTRAVENOUS

## 2021-11-29 MED ORDER — ONDANSETRON HCL 4 MG/2ML IJ SOLN
INTRAMUSCULAR | Status: AC
  Filled 2021-11-29: qty 2

## 2021-11-29 MED ORDER — DEXAMETHASONE SODIUM PHOSPHATE 10 MG/ML IJ SOLN
INTRAMUSCULAR | Status: DC | PRN
Start: 2021-11-29 — End: 2021-11-29
  Administered 2021-11-29: 5 mg via INTRAVENOUS

## 2021-11-29 MED ORDER — SODIUM CHLORIDE 0.9 % IV SOLN: INTRAVENOUS | Status: DC

## 2021-11-29 MED ORDER — OXYCODONE-ACETAMINOPHEN 5-325 MG PO TABS: 1.0000 | ORAL_TABLET | ORAL | 0 refills | Status: DC | PRN

## 2021-11-29 MED ORDER — DEXAMETHASONE SODIUM PHOSPHATE 10 MG/ML IJ SOLN
INTRAMUSCULAR | Status: AC
  Filled 2021-11-29: qty 1

## 2021-11-29 MED ORDER — FENTANYL CITRATE (PF) 250 MCG/5ML IJ SOLN
INTRAMUSCULAR | Status: AC
  Filled 2021-11-29: qty 5

## 2021-11-29 SURGICAL SUPPLY — 44 items
ADH SKN CLS APL DERMABOND .7 (GAUZE/BANDAGES/DRESSINGS) ×1
BAG COUNTER SPONGE SURGICOUNT (BAG) ×3 IMPLANT
BAG SPNG CNTER NS LX DISP (BAG) ×1
BALL CTTN LRG ABS STRL LF (GAUZE/BANDAGES/DRESSINGS) ×1
CANISTER SUCT 3000ML PPV (MISCELLANEOUS) ×3 IMPLANT
CLIP TI MEDIUM 6 (CLIP) ×1 IMPLANT
CLIP TI WIDE RED SMALL 6 (CLIP) ×1 IMPLANT
CNTNR URN SCR LID CUP LEK RST (MISCELLANEOUS) ×2 IMPLANT
CONT SPEC 4OZ STRL OR WHT (MISCELLANEOUS) ×2
COTTONBALL LRG STERILE PKG (GAUZE/BANDAGES/DRESSINGS) ×3 IMPLANT
COVER SURGICAL LIGHT HANDLE (MISCELLANEOUS) ×3 IMPLANT
DECANTER SPIKE VIAL GLASS SM (MISCELLANEOUS) ×3 IMPLANT
DERMABOND ADVANCED (GAUZE/BANDAGES/DRESSINGS) ×1
DERMABOND ADVANCED .7 DNX12 (GAUZE/BANDAGES/DRESSINGS) ×2 IMPLANT
DRAPE OPHTHALMIC 77X100 STRL (CUSTOM PROCEDURE TRAY) ×3 IMPLANT
ELECT NDL TIP 2.8 STRL (NEEDLE) ×2 IMPLANT
ELECT NEEDLE TIP 2.8 STRL (NEEDLE) ×2 IMPLANT
ELECT REM PT RETURN 9FT ADLT (ELECTROSURGICAL) ×2
ELECTRODE REM PT RTRN 9FT ADLT (ELECTROSURGICAL) ×2 IMPLANT
GAUZE SPONGE 2X2 8PLY STRL LF (GAUZE/BANDAGES/DRESSINGS) ×2 IMPLANT
GLOVE BIO SURGEON STRL SZ7.5 (GLOVE) ×3 IMPLANT
GLOVE BIOGEL PI IND STRL 8 (GLOVE) ×2 IMPLANT
GLOVE BIOGEL PI INDICATOR 8 (GLOVE) ×1
GLOVE SURG POLY ORTHO LF SZ7.5 (GLOVE) IMPLANT
GLOVE SURG UNDER LTX SZ8 (GLOVE) ×3 IMPLANT
GOWN STRL REUS W/ TWL LRG LVL3 (GOWN DISPOSABLE) ×6 IMPLANT
GOWN STRL REUS W/TWL LRG LVL3 (GOWN DISPOSABLE) ×6
KIT BASIN OR (CUSTOM PROCEDURE TRAY) ×3 IMPLANT
KIT TURNOVER KIT B (KITS) ×3 IMPLANT
NDL HYPO 25GX1X1/2 BEV (NEEDLE) ×2 IMPLANT
NEEDLE HYPO 25GX1X1/2 BEV (NEEDLE) ×2 IMPLANT
NS IRRIG 1000ML POUR BTL (IV SOLUTION) ×3 IMPLANT
PACK GENERAL/GYN (CUSTOM PROCEDURE TRAY) ×3 IMPLANT
PAD ARMBOARD 7.5X6 YLW CONV (MISCELLANEOUS) ×6 IMPLANT
SPONGE GAUZE 2X2 STER 10/PKG (GAUZE/BANDAGES/DRESSINGS) ×1
SUT MNCRL AB 4-0 PS2 18 (SUTURE) ×3 IMPLANT
SUT PROLENE 6 0 BV (SUTURE) IMPLANT
SUT SILK 3 0 (SUTURE) ×2
SUT SILK 3-0 18XBRD TIE 12 (SUTURE) ×2 IMPLANT
SUT VIC AB 3-0 SH 27 (SUTURE) ×2
SUT VIC AB 3-0 SH 27X BRD (SUTURE) ×2 IMPLANT
SYR CONTROL 10ML LL (SYRINGE) ×3 IMPLANT
TOWEL GREEN STERILE (TOWEL DISPOSABLE) ×3 IMPLANT
WATER STERILE IRR 1000ML POUR (IV SOLUTION) ×3 IMPLANT

## 2021-11-29 NOTE — Telephone Encounter (Signed)
Still waiting on updated info, as notes sent did not mention bxp.  Appears pt had bxp today. ?

## 2021-11-29 NOTE — Anesthesia Postprocedure Evaluation (Signed)
Anesthesia Post Note ? ?Patient: Anita Mccormick ? ?Procedure(s) Performed: LEFT BIOPSY TEMPORAL ARTERY (Left: Head) ? ?  ? ?Patient location during evaluation: PACU ?Anesthesia Type: General ?Level of consciousness: awake and alert ?Pain management: pain level controlled ?Vital Signs Assessment: post-procedure vital signs reviewed and stable ?Respiratory status: spontaneous breathing, nonlabored ventilation, respiratory function stable and patient connected to nasal cannula oxygen ?Cardiovascular status: blood pressure returned to baseline and stable ?Postop Assessment: no apparent nausea or vomiting ?Anesthetic complications: no ? ? ?No notable events documented. ? ?Last Vitals:  ?Vitals:  ? 11/29/21 1200 11/29/21 1215  ?BP: (!) 119/50 116/64  ?Pulse: 94 83  ?Resp: 15 19  ?Temp:  37.1 ?C  ?SpO2: 96% 97%  ?  ?Last Pain:  ?Vitals:  ? 11/29/21 1215  ?TempSrc:   ?PainSc: 0-No pain  ? ? ?  ?  ?  ?  ?  ?  ? ?Anita Mccormick ? ? ? ? ?

## 2021-11-29 NOTE — Transfer of Care (Signed)
Immediate Anesthesia Transfer of Care Note ? ?Patient: Anita Mccormick ? ?Procedure(s) Performed: LEFT BIOPSY TEMPORAL ARTERY (Left: Head) ? ?Patient Location: PACU ? ?Anesthesia Type:General ? ?Level of Consciousness: awake, drowsy and patient cooperative ? ?Airway & Oxygen Therapy: Patient Spontanous Breathing and Patient connected to face mask oxygen ? ?Post-op Assessment: Report given to RN, Post -op Vital signs reviewed and stable and Patient moving all extremities X 4 ? ?Post vital signs: Reviewed and stable ? ?Last Vitals:  ?Vitals Value Taken Time  ?BP 110/68 11/29/21 1148  ?Temp    ?Pulse 108 11/29/21 1150  ?Resp 31 11/29/21 1150  ?SpO2 94 % 11/29/21 1150  ?Vitals shown include unvalidated device data. ? ?Last Pain:  ?Vitals:  ? 11/29/21 0857  ?TempSrc:   ?PainSc: 0-No pain  ?   ? ?  ? ?Complications: No notable events documented. ?

## 2021-11-29 NOTE — Interval H&P Note (Signed)
History and Physical Interval Note: ? ?11/29/2021 ?10:36 AM ? ?[name]  has presented today for surgery, with the diagnosis of Rule out temporal arteritis.  The various methods of treatment have been discussed with the patient and family. After consideration of risks, benefits and other options for treatment, the patient has consented to  Procedure(s): ?LEFT BIOPSY TEMPORAL ARTERY (Left) as a surgical intervention.  The patient's history has been reviewed, patient examined, no change in status, stable for surgery.  I have reviewed the patient's chart and labs.  Questions were answered to the patient's satisfaction.   ? ? ?Deitra Mayo ? ? ?

## 2021-11-29 NOTE — Op Note (Signed)
? ? ?  NAME: [name]    MRN: [id number] ?DOB: November 09, 1945    DATE OF OPERATION: 11/29/2021 ? ?PREOP DIAGNOSIS:   ? ?Rule out temporal arteritis ? ?POSTOP DIAGNOSIS:   ? ?Same ? ?PROCEDURE:  ?  ?Left temporal artery biopsy ? ?SURGEON: Judeth Cornfield. Scot Dock, MD ? ?ASSIST: None ? ?ANESTHESIA: General ? ?EBL: Minimal ? ?INDICATIONS:  ? ? Anita Mccormick is a 76 y.o. female who presented with visual field loss on the left and had an elevated sed rate.  We are asked to provide a temporal artery biopsy. ? ?FINDINGS:  ? ?The artery did not appear inflamed.  Specimen was sent to pathology. ? ?TECHNIQUE:  ? ?The patient was taken to the operating room and received a general anesthetic.  I looked at the temporal artery with the SonoSite and it was fairly small.  The left temporal area was prepped and draped in usual sterile fashion.  An incision about 3 cm long was made over the artery anterior to the ear this was dissected free.  It was ligated proximally and distally.  About a 2 cm segment of the artery was taken.  Hemostasis was obtained use electrocautery.  The wound was irrigated.  The wound was closed with a deep layer of 3-0 Vicryl to skin closed with 4-0 Monocryl.  Dermabond was applied.  The patient tolerated the procedure well was transferred to the recovery room in stable condition.  All needle and sponge counts were correct. ? ?Deitra Mayo, MD, FACS ?[facility] ? ?DATE OF DICTATION:   11/29/2021 ? ?

## 2021-11-29 NOTE — Anesthesia Procedure Notes (Signed)
Procedure Name: Intubation ?Date/Time: 11/29/2021 11:04 AM ?Performed by: Rande Brunt, CRNA ?Pre-anesthesia Checklist: Patient identified, Emergency Drugs available, Suction available and Patient being monitored ?Patient Re-evaluated:Patient Re-evaluated prior to induction ?Oxygen Delivery Method: Circle System Utilized ?Preoxygenation: Pre-oxygenation with 100% oxygen ?Induction Type: IV induction ?Ventilation: Mask ventilation without difficulty ?Laryngoscope Size: Sabra Heck and 2 ?Grade View: Grade I ?Tube type: Oral ?Tube size: 7.0 mm ?Number of attempts: 1 ?Airway Equipment and Method: Stylet and Oral airway ?Placement Confirmation: ETT inserted through vocal cords under direct vision, positive ETCO2 and breath sounds checked- equal and bilateral ?Secured at: 22 cm ?Tube secured with: Tape ?Dental Injury: Teeth and Oropharynx as per pre-operative assessment  ? ? ? ? ?

## 2021-11-30 ENCOUNTER — Encounter (HOSPITAL_COMMUNITY): Payer: Self-pay | Admitting: Vascular Surgery

## 2021-12-02 LAB — SURGICAL PATHOLOGY

## 2021-12-03 ENCOUNTER — Other Ambulatory Visit: Payer: Self-pay | Admitting: *Deleted

## 2021-12-03 DIAGNOSIS — I6522 Occlusion and stenosis of left carotid artery: Secondary | ICD-10-CM

## 2021-12-13 ENCOUNTER — Other Ambulatory Visit: Payer: Self-pay

## 2021-12-13 DIAGNOSIS — D649 Anemia, unspecified: Secondary | ICD-10-CM

## 2021-12-16 ENCOUNTER — Other Ambulatory Visit: Payer: Self-pay

## 2021-12-16 ENCOUNTER — Inpatient Hospital Stay: Payer: Medicare HMO | Attending: Hematology

## 2021-12-16 ENCOUNTER — Inpatient Hospital Stay: Payer: Medicare HMO | Admitting: Hematology

## 2021-12-16 VITALS — BP 149/62 | HR 70 | Temp 97.6°F | Resp 18 | Wt 138.5 lb

## 2021-12-16 DIAGNOSIS — D649 Anemia, unspecified: Secondary | ICD-10-CM | POA: Insufficient documentation

## 2021-12-16 DIAGNOSIS — D696 Thrombocytopenia, unspecified: Secondary | ICD-10-CM | POA: Diagnosis not present

## 2021-12-16 DIAGNOSIS — I10 Essential (primary) hypertension: Secondary | ICD-10-CM | POA: Diagnosis not present

## 2021-12-16 DIAGNOSIS — E119 Type 2 diabetes mellitus without complications: Secondary | ICD-10-CM | POA: Insufficient documentation

## 2021-12-16 DIAGNOSIS — D509 Iron deficiency anemia, unspecified: Secondary | ICD-10-CM

## 2021-12-16 LAB — CMP (CANCER CENTER ONLY)
ALT: 11 U/L (ref 0–44)
AST: 18 U/L (ref 15–41)
Albumin: 3.4 g/dL — ABNORMAL LOW (ref 3.5–5.0)
Alkaline Phosphatase: 72 U/L (ref 38–126)
Anion gap: 4 — ABNORMAL LOW (ref 5–15)
BUN: 11 mg/dL (ref 8–23)
CO2: 29 mmol/L (ref 22–32)
Calcium: 9.4 mg/dL (ref 8.9–10.3)
Chloride: 105 mmol/L (ref 98–111)
Creatinine: 1.19 mg/dL — ABNORMAL HIGH (ref 0.44–1.00)
GFR, Estimated: 48 mL/min — ABNORMAL LOW (ref >60)
Glucose, Bld: 246 mg/dL — ABNORMAL HIGH (ref 70–99)
Potassium: 4.1 mmol/L (ref 3.5–5.1)
Sodium: 138 mmol/L (ref 135–145)
Total Bilirubin: 1.2 mg/dL (ref 0.3–1.2)
Total Protein: 6.9 g/dL (ref 6.5–8.1)

## 2021-12-16 LAB — CBC WITH DIFFERENTIAL/PLATELET
Abs Immature Granulocytes: 0.01 10*3/uL (ref 0.00–0.07)
Basophils Absolute: 0 10*3/uL (ref 0.0–0.1)
Basophils Relative: 1 %
Eosinophils Absolute: 1 10*3/uL — ABNORMAL HIGH (ref 0.0–0.5)
Eosinophils Relative: 22 %
HCT: 35.9 % — ABNORMAL LOW (ref 36.0–46.0)
Hemoglobin: 12.3 g/dL (ref 12.0–15.0)
Immature Granulocytes: 0 %
Lymphocytes Relative: 14 %
Lymphs Abs: 0.7 10*3/uL (ref 0.7–4.0)
MCH: 34.1 pg — ABNORMAL HIGH (ref 26.0–34.0)
MCHC: 34.3 g/dL (ref 30.0–36.0)
MCV: 99.4 fL (ref 80.0–100.0)
Monocytes Absolute: 0.3 10*3/uL (ref 0.1–1.0)
Monocytes Relative: 7 %
Neutro Abs: 2.7 10*3/uL (ref 1.7–7.7)
Neutrophils Relative %: 56 %
Platelets: 127 10*3/uL — ABNORMAL LOW (ref 150–400)
RBC: 3.61 MIL/uL — ABNORMAL LOW (ref 3.87–5.11)
RDW: 12.7 % (ref 11.5–15.5)
WBC: 4.7 10*3/uL (ref 4.0–10.5)
nRBC: 0 % (ref 0.0–0.2)

## 2021-12-16 LAB — FERRITIN: Ferritin: 36 ng/mL (ref 11–307)

## 2021-12-16 LAB — IRON AND IRON BINDING CAPACITY (CC-WL,HP ONLY)
Iron: 127 ug/dL (ref 28–170)
Saturation Ratios: 38 % — ABNORMAL HIGH (ref 10.4–31.8)
TIBC: 336 ug/dL (ref 250–450)
UIBC: 209 ug/dL (ref 148–442)

## 2021-12-16 LAB — VITAMIN B12: Vitamin B-12: 339 pg/mL (ref 180–914)

## 2021-12-16 NOTE — Progress Notes (Signed)
[name]   HEMATOLOGY/ONCOLOGY CLINIC NOTE  Date of Service: 12/16/2021  Patient Care Team: Billie Ruddy, MD as PCP - General ([place]) Martinique, Peter M, MD as PCP - Cardiology (Cardiology) Warden Fillers, MD as Consulting Physician (Ophthalmology) Viona Gilmore, [facility] as Pharmacist (Pharmacist)  CHIEF COMPLAINTS/PURPOSE OF CONSULTATION:  Follow-up for continued evaluation and management of anemia  HISTORY OF PRESENTING ILLNESS:  Please see previous note for details on initial presentation  Hutchinson is a 76 y.o. female here for follow-up for continued evaluation and management of anemia. She reports She is doing well with no new symptoms or concerns.  She reports minor abdominal pain.  No black or blood stools. No other new or acute focal symptoms.  We reviewed labs done today 12/16/2021. CBC shows hemoglobin of 12.3 in the hospital, WBC count of 4.7k platelets have improved to 127k Ferritin 36 B12 339 Iron saturation 38%  MEDICAL HISTORY:  Past Medical History:  Diagnosis Date   Arthritis    Cancer ([place])    breast left   Cerebrovascular accident [facility])    October 29 2021- Left eye- blind   COLONIC POLYPS, HX OF 12/10/2006   Qualifier: Diagnosis of  By: Leanne Chang MD, Bruce     Diabetes mellitus type II, controlled ([place]) 12/10/2006   Poor control on Januvia 125m and glipizide 136mXL Lab Results  Component Value Date   HGBA1C 8.3* 11/02/2014        Eczema    Fatty liver disease, nonalcoholic 1240/98/1191 Per Dr. SwLeanne Changab Results  Component Value Date   ALT 31 11/02/2014   AST 57* 11/02/2014   ALKPHOS 104 11/02/2014   BILITOT 1.1 11/02/2014       GERD (gastroesophageal reflux disease)    History of blood transfusion    History of kidney stones 2023   11/28/21 small stone currently, taking flomax   Hypertension     SURGICAL HISTORY: Past Surgical History:  Procedure Laterality Date   ARTERY BIOPSY Left 11/29/2021   Procedure: LEFT  BIOPSY TEMPORAL ARTERY;  Surgeon: DiAngelia MouldMD;  [facility];  Service: Vascular;  Laterality: Left;   CATARACT EXTRACTION Bilateral    MASTECTOMY Left    TUBAL LIGATION      SOCIAL HISTORY: Social History   Socioeconomic History   Marital status: Married    Spouse name: Not on file   Number of children: Not on file   Years of education: Not on file   Highest education level: Not on file  Occupational History   Not on file  Tobacco Use   Smoking status: Some Days    Packs/day: 0.20    Years: 10.00    Pack years: 2.00    Types: Cigarettes   Smokeless tobacco: Never  Vaping Use   Vaping Use: Never used  Substance and Sexual Activity   Alcohol use: Not Currently   Drug use: No   Sexual activity: Not Currently  Other Topics Concern   Not on file  Social History Narrative   Married (husband HaLynann Bologna 4 children. 11 grandkids. 1 greatgrandchild.       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   Financial Resource Strain: Medium Risk   Difficulty of Paying Living Expenses: Somewhat hard  Food Insecurity: No Food Insecurity   Worried About RuCharity fundraisern the Last Year: Never  true   Ran Out of Food in the Last Year: Never true  Transportation Needs: No Transportation Needs   Lack of Transportation (Medical): No   Lack of Transportation (Non-Medical): No  Physical Activity: Inactive   Days of Exercise per Week: 0 days   Minutes of Exercise per Session: 0 min  Stress: No Stress Concern Present   Feeling of Stress : Not at all  Social Connections: Not on file  Intimate Partner Violence: Not on file    FAMILY HISTORY: Family History  Problem Relation Age of Onset   Stroke Mother    Cancer Father        prostate   Diabetes Sister    Pancreatic cancer Sister     ALLERGIES:  is allergic to ace inhibitors, bee venom, chlorthalidone, metformin, penicillins, and  sulfamethoxazole.  MEDICATIONS:  Current Outpatient Medications  Medication Sig Dispense Refill   ACCU-CHEK GUIDE test strip TEST UP TO 4 TIMES A DAY 100 strip 1   atenolol (TENORMIN) 100 MG tablet TAKE 1 TABLET BY MOUTH TWICE A DAY 180 tablet 1   blood glucose meter kit and supplies KIT Dispense based on patient and insurance preference. Use up to four times daily as directed. 1 each 0   Blood Glucose Monitoring Suppl (ONETOUCH ULTRALINK) w/Device KIT Test twice daily (onetouch ultra) 1 each 0   Blood Pressure Monitoring (BLOOD PRESSURE CUFF) MISC Use daily to monitor blood pressue 1 each 0   Calcium Carbonate-Vitamin D (CALCIUM PLUS VITAMIN D) 600-100 MG-UNIT CAPS Take 1 capsule by mouth 2 (two) times daily.     Continuous Blood Gluc Receiver (FREESTYLE LIBRE READER) DEVI 1 Device by Does not apply route 4 (four) times daily as needed. 1 each 0   Continuous Blood Gluc Sensor (FREESTYLE LIBRE 14 DAY SENSOR) MISC 1 Device by Does not apply route every 14 (fourteen) days. 2 each 5   furosemide (LASIX) 20 MG tablet Take 1 tablet (20 mg total) by mouth daily for 3 days. (Patient not taking: Reported on 11/28/2021) 3 tablet 0   glipiZIDE (GLUCOTROL XL) 10 MG 24 hr tablet Take 1 tablet (10 mg total) by mouth 2 (two) times daily. 180 tablet 1   glucose blood (ONETOUCH ULTRA) test strip Use as instructed for blood sugar testing 3 times daily. 100 each 11   glucose blood test strip 1 each by Other route 4 (four) times daily as needed for other. Use as instructed 100 each 12   Lancets (ONETOUCH ULTRASOFT) lancets Use as instructed 100 each 11   oxyCODONE-acetaminophen (PERCOCET) 5-325 MG tablet Take 1 tablet by mouth every 4 (four) hours as needed for severe pain. 10 tablet 0   pantoprazole (PROTONIX) 40 MG tablet Take 1 tablet by mouth daily as needed (Heartburn).     tamsulosin (FLOMAX) 0.4 MG CAPS capsule TAKE 1 CAPSULE BY MOUTH EVERY DAY 30 capsule 0   TRULICITY 1.5 LO/7.5IE SOPN INJECT 1.5 MG INTO THE  SKIN ONCE A WEEK. (Patient taking differently: Takes on Sat) 2 mL 9   No current facility-administered medications for this visit.    REVIEW OF SYSTEMS:    No overt GI bleeding at this time. No abdominal pain or distention.   PHYSICAL EXAMINATION: Vitals:   12/16/21 1304  BP: (!) 149/62  Pulse: 70  Resp: 18  Temp: 97.6 F (36.4 C)  SpO2: 100%   NAD GENERAL:alert, in no acute distress and comfortable SKIN: no acute rashes, no significant lesions EYES: conjunctiva are pink  and non-injected, sclera anicteric NECK: supple, no JVD LYMPH:  no palpable lymphadenopathy in the cervical, axillary or inguinal regions LUNGS: clear to auscultation b/l with normal respiratory effort HEART: regular rate & rhythm ABDOMEN:  normoactive bowel sounds , non tender, not distended. Extremity: no pedal edema PSYCH: alert & oriented x 3 with fluent speech NEURO: no focal motor/sensory deficits  Exam performed in chair.  LABORATORY DATA:  I have reviewed the data as listed  .    Latest Ref Rng & Units 12/16/2021   12:55 PM 11/29/2021   10:02 AM 10/25/2021   12:36 PM  CBC  WBC 4.0 - 10.5 K/uL 4.7    4.6    Hemoglobin 12.0 - 15.0 g/dL 12.3   12.9   13.2    Hematocrit 36.0 - 46.0 % 35.9   38.0   39.2    Platelets 150 - 400 K/uL 127    121.0      .    Latest Ref Rng & Units 12/16/2021   12:55 PM 11/29/2021   10:02 AM 10/25/2021   12:36 PM  CMP  Glucose 70 - 99 mg/dL 246   137   167    BUN 8 - 23 mg/dL _0 Creatinine 0.44 - 1.00 mg/dL 1.19   1.20   1.11    Sodium 135 - 145 mmol/L 138   138   138    Potassium 3.5 - 5.1 mmol/L 4.1   4.1   4.1    Chloride 98 - 111 mmol/L 105   105   100    CO2 22 - 32 mmol/L 29    30    Calcium 8.9 - 10.3 mg/dL 9.4    10.4    Total Protein 6.5 - 8.1 g/dL 6.9    7.4    Total Bilirubin 0.3 - 1.2 mg/dL 1.2    1.3    Alkaline Phos 38 - 126 U/L 72    94    AST 15 - 41 U/L 18    22    ALT 0 - 44 U/L 11    11     . Lab Results  Component Value  Date   IRON 127 12/16/2021   TIBC 336 12/16/2021   IRONPCTSAT 38 (H) 12/16/2021   (Iron and TIBC)  Lab Results  Component Value Date   FERRITIN 36 12/16/2021   B12 -- 339 RBC folate within normal limits  RADIOGRAPHIC STUDIES: I have personally reviewed the radiological images as listed and agreed with the findings in the report. No results found.  ASSESSMENT & PLAN:   76 year old female with hypertension, dyslipidemia, diabetes, CHF with preserved ejection fraction, fatty liver with possible liver cirrhosis remote history of breast cancer status postmastectomy with no history of previous chemotherapy or radiation with.  1) Microcytic anemia due to significant iron deficiency Patient was admitted to the hospital in early November ,2022 with a hemoglobin of 7.1.  With a ferritin of 7 and iron saturation of 4%. She received 1 unit of PRBC and 1 dose of IV iron. Her recent hemoglobin levels admitted December have improved to 11.6 with an improvement in her ferritin iron saturation.  2) thrombocytopenia-chronic possible liver cirrhosis.  Possible other nutritional deficiencies.  3) . Patient Active Problem List   Diagnosis Date Noted   Central retinal artery occlusion of right eye 10/25/2021   Symptomatic anemia 06/06/2021   Hyperglycemia due to diabetes mellitus ([place])  06/06/2021   Elevated brain natriuretic peptide (BNP) level 06/06/2021   Atherosclerosis of aorta ([place]) 05/18/2020   Cirrhosis of liver without ascites ([place]) 05/18/2020   Internal hemorrhoids 06/22/2019   Dysuria 02/20/2018   Anemia    Sepsis due to Escherichia coli (E. coli) ([place])    AKI (acute kidney injury) ([place])    Sepsis ([place]) 09/19/2017   UTI (urinary tract infection) 09/19/2017   Chronic kidney disease (CKD), stage II (mild) 09/19/2017   Leukopenia 09/19/2017   Chronic diastolic CHF (congestive heart failure) ([place]) 09/19/2017   Statins contraindicated 06/12/2017   Hepatotoxicity due to statin drug  06/12/2017   History of adenomatous polyp of colon 03/18/2017   Viral URI with cough 01/17/2017   Thrombocytopenia ([place]) 12/19/2015   Insomnia 08/21/2015   Nicotine use disorder 05/21/2015   Eczema 05/21/2015   Fatty liver disease, nonalcoholic 05/27/2810   Hyperlipidemia 11/03/2007   History of CVA (cerebrovascular accident) 10/20/2007   Type II diabetes mellitus with renal manifestations ([place]) 12/10/2006   Essential hypertension 12/10/2006   History of breast cancer 12/10/2006   PLAN -We reviewed labs done today 12/16/2021. CBC shows hemoglobin of 12.3 in the hospital, WBC count of 4.7k platelets have improved to 127k Ferritin 36 B12 339 Iron saturation 38% -Continue follow-up with primary care physician for management of other medical comorbidities. -Patient has been given a referral to gastroenterology for EGD plus or minus colonoscopy and also for further evaluation of her possible liver cirrhosis.  Might need ultrasound with elastography. -No indication for additional IV iron at this time. -Recommend to continue taking vitamin B complex 1 capsule p.o. daily. -RTC in 12 months with labs  No orders of the defined types were placed in this encounter.  Follow-up RTC with Dr Irene Limbo with labs in 12 months  .The total time spent in the appointment was 20 minutes* .  All of the patient's questions were answered with apparent satisfaction. The patient knows to call the clinic with any problems, questions or concerns.   Sullivan Lone MD MS AAHIVMS [facility] Hematology/Oncology Physician [facility]  .*Total Encounter Time as defined by the Centers for Medicare and Medicaid Services includes, in addition to the face-to-face time of a patient visit (documented in the note above) non-face-to-face time: obtaining and reviewing outside history, ordering and reviewing medications, tests or procedures, care coordination (communications with other health care professionals or  caregivers) and documentation in the medical record.  I, Melene Muller, am acting as scribe for Dr. Sullivan Lone, MD.  .I have reviewed the above documentation for accuracy and completeness, and I agree with the above. Brunetta Genera MD

## 2021-12-17 ENCOUNTER — Telehealth: Payer: Self-pay | Admitting: Hematology

## 2021-12-17 NOTE — Telephone Encounter (Signed)
Scheduled follow-up appointment per 5/22 los. Patient is aware. Mailed calendar.

## 2021-12-18 ENCOUNTER — Other Ambulatory Visit: Payer: Self-pay | Admitting: Family Medicine

## 2021-12-18 ENCOUNTER — Other Ambulatory Visit: Payer: Medicare HMO

## 2021-12-18 ENCOUNTER — Ambulatory Visit: Payer: Medicare HMO | Admitting: Hematology

## 2021-12-18 DIAGNOSIS — Z87442 Personal history of urinary calculi: Secondary | ICD-10-CM

## 2021-12-18 DIAGNOSIS — R109 Unspecified abdominal pain: Secondary | ICD-10-CM

## 2021-12-26 DIAGNOSIS — K7400 Hepatic fibrosis, unspecified: Secondary | ICD-10-CM | POA: Diagnosis not present

## 2021-12-26 DIAGNOSIS — D509 Iron deficiency anemia, unspecified: Secondary | ICD-10-CM | POA: Diagnosis not present

## 2021-12-26 DIAGNOSIS — K219 Gastro-esophageal reflux disease without esophagitis: Secondary | ICD-10-CM | POA: Diagnosis not present

## 2021-12-27 ENCOUNTER — Telehealth (HOSPITAL_BASED_OUTPATIENT_CLINIC_OR_DEPARTMENT_OTHER): Payer: Self-pay | Admitting: *Deleted

## 2021-12-27 NOTE — Telephone Encounter (Signed)
Called patient to discuss scheduling the Echocardiogram ordered by Dr. [name].  Patient states she was told by Dr. Earlean Shawl that she probably did not need to have this testing done and she feels she does not need to have the test either.  Wanted to make you aware

## 2021-12-31 ENCOUNTER — Other Ambulatory Visit: Payer: Self-pay | Admitting: Family Medicine

## 2021-12-31 DIAGNOSIS — Z87442 Personal history of urinary calculi: Secondary | ICD-10-CM

## 2021-12-31 DIAGNOSIS — R109 Unspecified abdominal pain: Secondary | ICD-10-CM

## 2022-01-07 ENCOUNTER — Telehealth: Payer: Self-pay | Admitting: Pharmacist

## 2022-01-07 NOTE — Chronic Care Management (AMB) (Signed)
    Chronic Care Management Pharmacy Assistant   Name: Anita Mccormick  MRN: [id number] DOB: 07-20-1946  01/08/2022 APPOINTMENT REMINDER  Called Anita Mccormick, No answer, unable to leave message of appointment on 01/08/2022 at 11:00 via telephone visit with Jeni Salles, Pharm D. No voice mail set up for home or husbands cell.   Care Gaps: AWV - completed 10/01/2021 Last BP - 149/62 on 12/16/2021 Last A1C - 6.5 on 10/14/21 Shingrix - never done Covid booster - overdue Tdap - overdue  Star Rating Drug: Glipizide 10 mg - last filled 12/06/2021 [facility] Trulicity 1.5 YP/9.5 ml - PAP  Any gaps in medications fill history? No  Anita Mccormick  Catering manager [phone]

## 2022-01-08 ENCOUNTER — Ambulatory Visit (INDEPENDENT_AMBULATORY_CARE_PROVIDER_SITE_OTHER): Payer: Medicare HMO | Admitting: Pharmacist

## 2022-01-08 DIAGNOSIS — T466X5A Adverse effect of antihyperlipidemic and antiarteriosclerotic drugs, initial encounter: Secondary | ICD-10-CM

## 2022-01-08 DIAGNOSIS — I10 Essential (primary) hypertension: Secondary | ICD-10-CM

## 2022-01-08 DIAGNOSIS — E1129 Type 2 diabetes mellitus with other diabetic kidney complication: Secondary | ICD-10-CM

## 2022-01-08 NOTE — Patient Instructions (Signed)
Hi Anita Mccormick,   It was great to catch up with you again!  Please reach out to me if you have any questions or need anything before our follow up!  Best, Maddie  Jeni Salles, PharmD, [place] Pharmacist [place] [facility]   Visit Information   Goals Addressed   None    Patient Care Plan: CCM Pharmacy Care Plan     Problem Identified: Problem: Hypertension, Hyperlipidemia, Diabetes, Heart Failure, GERD, and Chronic Kidney Disease      Long-Range Goal: Patient-Specific Goal   Start Date: 02/27/2021  Expected End Date: 02/27/2022  Recent Progress: On track  Priority: High  Note:   Current Barriers:  Unable to independently monitor therapeutic efficacy  Pharmacist Clinical Goal(s):  Patient will achieve adherence to monitoring guidelines and medication adherence to achieve therapeutic efficacy through collaboration with PharmD and provider.   Interventions: 1:1 collaboration with Billie Ruddy, MD regarding development and update of comprehensive plan of care as evidenced by provider attestation and co-signature Inter-disciplinary care team collaboration (see longitudinal plan of care) Comprehensive medication review performed; medication list updated in electronic medical record  Hypertension (BP goal <140/90) -Not ideally controlled -Current treatment: Atenolol 100 mg 1 tablet twice daily - Appropriate, Query effective, Safe, Accessible Furosemide 20 mg as needed - Appropriate, Query effective, Safe, Accessible -Medications previously tried: chlorthalidone, ACE inhibitor  -Current home readings: 112/64 (checking at the drug store once a week) -Current dietary habits: limits salt intake -Current exercise habits: no structured exercise -Denies hypotensive/hypertensive symptoms -Educated on BP goals and benefits of medications for prevention of heart attack, stroke and kidney damage; Importance of home blood pressure monitoring; Proper BP  monitoring technique; -Counseled to monitor BP at home weekly, document, and provide log at future appointments -Counseled on diet and exercise extensively Recommended to continue current medication  Hyperlipidemia: (LDL goal < 70) -Controlled -Current treatment: No medications -Medications previously tried: statins (elevated LFTs)  -Current dietary patterns: did not discuss -Current exercise habits: no formal exercise -Educated on Cholesterol goals;  Exercise goal of 150 minutes per week; -Counseled on diet and exercise extensively Recommended repeat lipid panel  Diabetes (A1c goal <7%) -Controlled -Current medications: Trulicity 1.5 inject 1.5 mg once a week - Appropriate, Effective, Safe, Accessible Glipizide XL 10 mg 1 tablet twice a daily - Appropriate, Effective, Safe, Accessible -Medications previously tried: metformin (diarrhea)  -Current home glucose readings fasting glucose: 120, 125 (highest she has seen recently) post prandial glucose: not much -Denies hypoglycemic/hyperglycemic symptoms -Current meal patterns:  breakfast: grapefruits with oatmeal, dried raisins and cranberries ([employer]), sausage lunch: salad, chicken salad with crackers  dinner: pasta and shrimp or salmon with vegetable snacks: ritz crackers or potato chips; corn chips; donut; sugarless drinks drinks: water; diet soda -Current exercise: no structured exercise -Educated on A1c and blood sugar goals; Complications of diabetes including kidney damage, retinal damage, and cardiovascular disease; Exercise goal of 150 minutes per week; Carbohydrate counting and/or plate method -Counseled to check feet daily and get yearly eye exams -Counseled on diet and exercise extensively Recommended to continue current medication  Heart Failure (Goal: manage symptoms and prevent exacerbations) -Not ideally controlled -Last ejection fraction: 60-65% (Date: 08/11/2017) -HF type: Diastolic -NYHA Class: II  (slight limitation of activity) -AHA HF Stage: B (Heart disease present - no symptoms present) -Current treatment: Furosemide 20 mg 1 tablet every other day as needed - Appropriate, Effective, Safe, Accessible Potassium chloride 20 mEq tablet daily - Appropriate, Effective, Safe, Accessible -Medications previously  tried: none  -Current home BP/HR readings: refer to above -Current dietary habits: limits salt intake -Current exercise habits: minimal -Educated on Importance of weighing daily; if you gain more than 3 pounds in one day or 5 pounds in one week, call PCP Proper diuretic administration and potassium supplementation -Counseled on diet and exercise extensively Recommended to continue current medication  GERD (Goal: minimize symptoms) -Controlled -Current treatment  Omeprazole 20 mg daily - Appropriate, Effective, Safe, Accessible -Medications previously tried: none  - Reassess the need for medication at follow up.  Neuropathy (Goal: minimize nerve pain/tingling) -Controlled -Current treatment  Gabapentin 100 mg daily - Appropriate, Effective, Safe, Accessible -Medications previously tried: none  -Recommended to continue current medication  History of CVA (Goal: prevent future strokes) -Controlled -Current treatment  No medications -Medications previously tried: none  -Recommended to continue as is.   Health Maintenance -Vaccine gaps: shingrix, tetanus, COVID booster -Current therapy:  Multivitamin 1 tablet daily Ferrous gluconate 325 mg 1 tablet daily -Educated on Cost vs benefit of each product must be carefully weighed by individual consumer -Patient is satisfied with current therapy and denies issues -Recommended to continue as is  Patient Goals/Self-Care Activities Patient will:  - take medications as prescribed check glucose daily, document, and provide at future appointments check blood pressure weekly, document, and provide at future appointments target  a minimum of 150 minutes of moderate intensity exercise weekly  Follow Up Plan: Telephone follow up appointment with care management team member scheduled for: 6 months        Patient verbalizes understanding of instructions and care plan provided today and agrees to view in Agenda. Active MyChart status and patient understanding of how to access instructions and care plan via MyChart confirmed with patient.    Telephone follow up appointment with pharmacy team member scheduled for:  Viona Gilmore, [facility]

## 2022-01-08 NOTE — Progress Notes (Signed)
Chronic Care Management Pharmacy Note  01/08/2022 Name:  Anita Mccormick MRN:  [id number] DOB:  November 01, 1945  Summary: A1c at goal < 7% Pt finished course of tamsulosin as she passed her kidney stone  Recommendations/Changes made from today's visit: -Recommended routine BP monitoring at home -Updated medication list to reflect recent changes  Plan: Follow up DM/BP assessment in 3 months Follow up in 6 months for PAP renewal  Subjective: Anita Mccormick is an 76 y.o. year old female who is a primary patient of Billie Ruddy, MD.  The CCM team was consulted for assistance with disease management and care coordination needs.    Engaged with patient by telephone for follow up visit in response to provider referral for pharmacy case management and/or care coordination services.   Consent to Services:  The patient was given information about Chronic Care Management services, agreed to services, and gave verbal consent prior to initiation of services.  Please see initial visit note for detailed documentation.   Patient Care Team: Billie Ruddy, MD as PCP - General (Family Medicine) Martinique, Peter M, MD as PCP - Cardiology (Cardiology) Warden Fillers, MD as Consulting Physician (Ophthalmology) Viona Gilmore, [facility] as Pharmacist (Pharmacist)  Recent office visits: 11/08/21 [name] MD: Patient presented for vaginal discharge. Plan for PAP smear and prescribed tramadol.  10/25/2021 [name] MD - Patient was seen for central retinal artery occlusion of left eye and additional issues. No medication changes. Follow up in 2 weeks.    10/23/2021 [name] MD - Patient was seen for right flank pain and additional issues. Started Tamsulosin 0.4 mg daily and Tramadol 50 mg every 8 hours. Return if symptoms worsen or fail to improve.   10/14/2021 [name] MD - Patient was seen for Right upper quadrant abdominal pain and additional issues. Referral to general surgery. No  medication changes. Return in about 1 month (around 11/14/2021), or if symptoms worsen or fail to improve.   10/01/2021 Glenna Durand LPN - Medicare annual wellness exam.  08/14/2021 [name] - Patient was seen for Type 2 diabetes mellitus with other diabetic kidney complication, without long-term current use of insulin and additional issues. Changed to Ferrous Gluconate 324 mg daily. Follow up in 2 months.  Recent consult visits: 12/16/21 Sullivan Lone, MD (hem/oncology): Patient presented for anemia follow up.  11/28/21 Deitra Mayo, MD (vascular surgery): Patient presented for initial visit for carotid stenosis.  11/12/21 Aaron Edelman Little (orthotics): Patient presented for fitting for diabetic shoes.  11/12/21 Holland Commons, DPM (Podiatry) : Patient presented for nail trim. Follow up in 3 months.   08/27/2021 Juleen China MD (oncology) - Patient was seen for anemia and additional issues. No medication changes. Follow up in 4 months.   08/13/2021 Celesta Gentile DPM (podiatry) - Patient was seen for Type II diabetes mellitus with neurological manifestations and additional issues. No medication changes. Follow up in 9 weeks.    08/07/2021 Juleen China MD (oncology) - Patient was seen for anemia and additional issues. No medication changes. Follow up in 7-10 days.   Hospital visits: 11/29/21 Patient admitted to [facility] for left biopsy of temporal artery.   Objective:  Lab Results  Component Value Date   CREATININE 1.19 (H) 12/16/2021   BUN 11 12/16/2021   GFR 48.80 (L) 10/25/2021   GFRNONAA 48 (L) 12/16/2021   GFRAA >60 11/16/2017   NA 138 12/16/2021   K 4.1 12/16/2021   CALCIUM 9.4 12/16/2021   CO2  29 12/16/2021   GLUCOSE 246 (H) 12/16/2021    Lab Results  Component Value Date/Time   HGBA1C 6.5 10/14/2021 03:21 PM   HGBA1C 6.6 (H) 06/05/2021 12:22 PM   GFR 48.80 (L) 10/25/2021 12:36 PM   GFR 60.35 10/14/2021 03:21 PM   MICROALBUR <0.7 06/08/2019  12:55 PM   MICROALBUR 0.2 04/11/2014 11:36 AM    Last diabetic Eye exam:  Lab Results  Component Value Date/Time   HMDIABEYEEXA No Retinopathy 10/24/2021 11:25 AM    Last diabetic Foot exam:  Lab Results  Component Value Date/Time   HMDIABFOOTEX done 07/28/2013 12:00 AM     Lab Results  Component Value Date   CHOL 126 10/25/2021   HDL 54.30 10/25/2021   LDLCALC 57 10/25/2021   LDLDIRECT 95.8 07/02/2006   TRIG 76.0 10/25/2021   CHOLHDL 2 10/25/2021       Latest Ref Rng & Units 12/16/2021   12:55 PM 10/25/2021   12:36 PM 10/14/2021    3:21 PM  Hepatic Function  Total Protein 6.5 - 8.1 g/dL 6.9  7.4  7.4   Albumin 3.5 - 5.0 g/dL 3.4  4.1  3.9   AST 15 - 41 U/L 18  22  63   ALT 0 - 44 U/L 11  11  22    Alk Phosphatase 38 - 126 U/L 72  94  99   Total Bilirubin 0.3 - 1.2 mg/dL 1.2  1.3  0.8     Lab Results  Component Value Date/Time   TSH 0.76 06/05/2021 12:22 PM   TSH 1.32 04/11/2014 11:36 AM       Latest Ref Rng & Units 12/16/2021   12:55 PM 11/29/2021   10:02 AM 10/25/2021   12:36 PM  CBC  WBC 4.0 - 10.5 K/uL 4.7   4.6   Hemoglobin 12.0 - 15.0 g/dL 12.3  12.9  13.2   Hematocrit 36.0 - 46.0 % 35.9  38.0  39.2   Platelets 150 - 400 K/uL 127   121.0     Lab Results  Component Value Date/Time   VD25OH 86.43 06/05/2021 12:22 PM   VD25OH CANCELED 05/10/2020 02:01 PM    Clinical ASCVD: Yes  The ASCVD Risk score (Arnett DK, et al., 2019) failed to calculate for the following reasons:   The valid total cholesterol range is 130 to 320 mg/dL       10/01/2021    1:49 PM 03/18/2017    8:18 AM 12/19/2015   10:23 AM  Depression screen PHQ 2/9  Decreased Interest 0 0 0  Down, Depressed, Hopeless 0 0 0  PHQ - 2 Score 0 0 0      Social History   Tobacco Use  Smoking Status Some Days   Packs/day: 0.20   Years: 10.00   Total pack years: 2.00   Types: Cigarettes  Smokeless Tobacco Never   BP Readings from Last 3 Encounters:  12/16/21 (!) 149/62  11/29/21 116/64   11/28/21 127/75   Pulse Readings from Last 3 Encounters:  12/16/21 70  11/29/21 83  11/28/21 76   Wt Readings from Last 3 Encounters:  12/16/21 138 lb 8 oz (62.8 kg)  11/29/21 141 lb (64 kg)  11/28/21 137 lb 6.4 oz (62.3 kg)   BMI Readings from Last 3 Encounters:  12/16/21 25.33 kg/m  11/29/21 25.79 kg/m  11/28/21 25.13 kg/m    Assessment/Interventions: Review of patient past medical history, allergies, medications, health status, including review of consultants reports, laboratory and other  test data, was performed as part of comprehensive evaluation and provision of chronic care management services.   SDOH:  (Social Determinants of Health) assessments and interventions performed: Yes   SDOH Screenings   Alcohol Screen: Not on file  Depression (PHQ2-9): Low Risk  (10/01/2021)   Depression (PHQ2-9)    PHQ-2 Score: 0  Financial Resource Strain: Medium Risk (10/01/2021)   Overall Financial Resource Strain (CARDIA)    Difficulty of Paying Living Expenses: Somewhat hard  Food Insecurity: No Food Insecurity (10/01/2021)   Hunger Vital Sign    Worried About Running Out of Food in the Last Year: Never true    Ran Out of Food in the Last Year: Never true  Housing: Not on file  Physical Activity: Inactive (10/01/2021)   Exercise Vital Sign    Days of Exercise per Week: 0 days    Minutes of Exercise per Session: 0 min  Social Connections: Not on file  Stress: No Stress Concern Present (10/01/2021)   [place]    Feeling of Stress : Not at all  Tobacco Use: High Risk (11/30/2021)   Patient History    Smoking Tobacco Use: Some Days    Smokeless Tobacco Use: Never    Passive Exposure: Not on file  Transportation Needs: No Transportation Needs (10/01/2021)   PRAPARE - Transportation    Lack of Transportation (Medical): No    Lack of Transportation (Non-Medical): No    CCM Care Plan  Allergies  Allergen Reactions    Ace Inhibitors Cough    ????   Bee Venom Swelling   Chlorthalidone     REACTION: unspecified   Metformin     REACTION: gi side effects   Penicillins     REACTION: urticaria (hives)   Sulfamethoxazole     REACTION: questionable    Medications Reviewed Today     Reviewed by Viona Gilmore, [facility] (Pharmacist) on 01/08/22 at [place] List Status: <None>   Medication Order Taking? Sig Documenting Provider Last Dose Status Informant  ACCU-CHEK GUIDE test strip [id number]  TEST UP TO 4 TIMES A DAY Billie Ruddy, MD  Active Self  atenolol (TENORMIN) 100 MG tablet [id number]  TAKE 1 TABLET BY MOUTH TWICE A DAY Billie Ruddy, MD  Active Self  blood glucose meter kit and supplies KIT [id number]  Dispense based on patient and insurance preference. Use up to four times daily as directed. Billie Ruddy, MD  Active Self  Blood Glucose Monitoring Suppl [facility] Karsten Fells) w/Device Drucie Opitz [id number]  Test twice daily (onetouch ultra) Billie Ruddy, MD  Active Self  Blood Pressure Monitoring (BLOOD PRESSURE CUFF) MISC [id number]  Use daily to monitor blood pressue Billie Ruddy, MD  Active Self    Discontinued 01/08/22 1120 (Completed Course) ferrous gluconate (FERGON) 324 MG tablet [id number] Yes Take 324 mg by mouth daily with breakfast. [provider] Taking Active    Patient not taking:   Discontinued 01/08/22 1120 (Completed Course)   glipiZIDE (GLUCOTROL XL) 10 MG 24 hr tablet [id number]  Take 1 tablet (10 mg total) by mouth 2 (two) times daily. Billie Ruddy, MD  Active Self  glucose blood [facility] ULTRA) test strip [id number]  Use as instructed for blood sugar testing 3 times daily. Billie Ruddy, MD  Active Self  glucose blood test strip [id number]  1 each by Other route 4 (four) times daily as needed for other. Use as instructed [name]  R, MD  Active Self  Lancets Glory Rosebush ULTRASOFT) lancets [id number]  Use as instructed Billie Ruddy, MD  Active Self  Multiple  Vitamin (MULTIVITAMIN) tablet [id number] Yes Take 1 tablet by mouth daily. [provider] Taking Active   oxyCODONE-acetaminophen (PERCOCET) 5-325 MG tablet [id number]  Take 1 tablet by mouth every 4 (four) hours as needed for severe pain. Angelia Mould, MD  Active   pantoprazole (PROTONIX) 40 MG tablet [id number]  Take 1 tablet by mouth daily as needed (Heartburn). [provider]  Active Self    Discontinued 01/08/22 1120 (Completed Course)   TRULICITY 1.5 JM/4.2AS SOPN [id number]  INJECT 1.5 MG INTO THE SKIN ONCE A WEEK.  Patient taking differently: Takes on Sat   Billie Ruddy, MD  Active Self            Patient Active Problem List   Diagnosis Date Noted   Central retinal artery occlusion of right eye 10/25/2021   Symptomatic anemia 06/06/2021   Hyperglycemia due to diabetes mellitus ([employer]) 06/06/2021   Elevated brain natriuretic peptide (BNP) level 06/06/2021   Atherosclerosis of aorta ([place]) 05/18/2020   Cirrhosis of liver without ascites ([place]) 05/18/2020   Internal hemorrhoids 06/22/2019   Dysuria 02/20/2018   Anemia    Sepsis due to Escherichia coli (E. coli) ([place])    AKI (acute kidney injury) ([place])    Sepsis ([place]) 09/19/2017   UTI (urinary tract infection) 09/19/2017   Chronic kidney disease (CKD), stage II (mild) 09/19/2017   Leukopenia 09/19/2017   Chronic diastolic CHF (congestive heart failure) ([place]) 09/19/2017   Statins contraindicated 06/12/2017   Hepatotoxicity due to statin drug 06/12/2017   History of adenomatous polyp of colon 03/18/2017   Viral URI with cough 01/17/2017   Thrombocytopenia ([place]) 12/19/2015   Insomnia 08/21/2015   Nicotine use disorder 05/21/2015   Eczema 05/21/2015   Fatty liver disease, nonalcoholic 79/89/2119   Hyperlipidemia 11/03/2007   History of CVA (cerebrovascular accident) 10/20/2007   Type II diabetes mellitus with renal manifestations ([place]) 12/10/2006   Essential hypertension 12/10/2006   History of  breast cancer 12/10/2006    Immunization History  Administered Date(s) Administered   Fluad Quad(high Dose 65+) 07/05/2020   Influenza Split 04/30/2012   Influenza Whole 06/01/2007, 04/27/2008, 06/05/2009, 05/02/2010, 04/28/2011   Influenza, High Dose Seasonal PF 03/14/2015, 04/04/2016, 04/30/2017, 04/30/2018, 05/04/2019   Influenza,inj,Quad PF,6+ Mos 03/23/2013   Influenza-Unspecified 04/11/2014   PFIZER(Purple Top)SARS-COV-2 Vaccination 09/25/2019, 10/25/2019, 05/12/2020, 03/11/2021   Pneumococcal Conjugate-13 06/30/2014   Pneumococcal Polysaccharide-23 03/21/2009, 08/21/2015   Tdap 09/22/2011   Zoster, Live 03/19/2011   Patient reports she is feeling much better. Patient reports she has been taking iron tablets now.  Patient has a blood pressure at home and it's an arm.   Conditions to be addressed/monitored:  Hypertension, Hyperlipidemia, Diabetes, Heart Failure, GERD, and Chronic Kidney Disease  Conditions addressed this visit: Anemia, diabetes, hypertension  Care Plan : CCM Pharmacy Care Plan  Updates made by Viona Gilmore, [place] since 01/08/2022 12:00 AM     Problem: Problem: Hypertension, Hyperlipidemia, Diabetes, Heart Failure, GERD, and Chronic Kidney Disease      Long-Range Goal: Patient-Specific Goal   Start Date: 02/27/2021  Expected End Date: 02/27/2022  Recent Progress: On track  Priority: High  Note:   Current Barriers:  Unable to independently monitor therapeutic efficacy  Pharmacist Clinical Goal(s):  Patient will achieve adherence to monitoring guidelines and medication adherence to achieve therapeutic efficacy through collaboration with PharmD and provider.  Interventions: 1:1 collaboration with Billie Ruddy, MD regarding development and update of comprehensive plan of care as evidenced by provider attestation and co-signature Inter-disciplinary care team collaboration (see longitudinal plan of care) Comprehensive medication review performed;  medication list updated in electronic medical record  Hypertension (BP goal <140/90) -Not ideally controlled -Current treatment: Atenolol 100 mg 1 tablet twice daily - Appropriate, Query effective, Safe, Accessible Furosemide 20 mg as needed - Appropriate, Query effective, Safe, Accessible -Medications previously tried: chlorthalidone, ACE inhibitor  -Current home readings: 112/64 (checking at the drug store once a week) -Current dietary habits: limits salt intake -Current exercise habits: no structured exercise -Denies hypotensive/hypertensive symptoms -Educated on BP goals and benefits of medications for prevention of heart attack, stroke and kidney damage; Importance of home blood pressure monitoring; Proper BP monitoring technique; -Counseled to monitor BP at home weekly, document, and provide log at future appointments -Counseled on diet and exercise extensively Recommended to continue current medication  Hyperlipidemia: (LDL goal < 70) -Controlled -Current treatment: No medications -Medications previously tried: statins (elevated LFTs)  -Current dietary patterns: did not discuss -Current exercise habits: no formal exercise -Educated on Cholesterol goals;  Exercise goal of 150 minutes per week; -Counseled on diet and exercise extensively Recommended repeat lipid panel  Diabetes (A1c goal <7%) -Controlled -Current medications: Trulicity 1.5 inject 1.5 mg once a week - Appropriate, Effective, Safe, Accessible Glipizide XL 10 mg 1 tablet twice a daily - Appropriate, Effective, Safe, Accessible -Medications previously tried: metformin (diarrhea)  -Current home glucose readings fasting glucose: 120, 125 (highest she has seen recently) post prandial glucose: not much -Denies hypoglycemic/hyperglycemic symptoms -Current meal patterns:  breakfast: grapefruits with oatmeal, dried raisins and cranberries ([employer]), sausage lunch: salad, chicken salad with crackers  dinner:  pasta and shrimp or salmon with vegetable snacks: ritz crackers or potato chips; corn chips; donut; sugarless drinks drinks: water; diet soda -Current exercise: no structured exercise -Educated on A1c and blood sugar goals; Complications of diabetes including kidney damage, retinal damage, and cardiovascular disease; Exercise goal of 150 minutes per week; Carbohydrate counting and/or plate method -Counseled to check feet daily and get yearly eye exams -Counseled on diet and exercise extensively Recommended to continue current medication  Heart Failure (Goal: manage symptoms and prevent exacerbations) -Not ideally controlled -Last ejection fraction: 60-65% (Date: 08/11/2017) -HF type: Diastolic -NYHA Class: II (slight limitation of activity) -AHA HF Stage: B (Heart disease present - no symptoms present) -Current treatment: Furosemide 20 mg 1 tablet every other day as needed - Appropriate, Effective, Safe, Accessible Potassium chloride 20 mEq tablet daily - Appropriate, Effective, Safe, Accessible -Medications previously tried: none  -Current home BP/HR readings: refer to above -Current dietary habits: limits salt intake -Current exercise habits: minimal -Educated on Importance of weighing daily; if you gain more than 3 pounds in one day or 5 pounds in one week, call PCP Proper diuretic administration and potassium supplementation -Counseled on diet and exercise extensively Recommended to continue current medication  GERD (Goal: minimize symptoms) -Controlled -Current treatment  Omeprazole 20 mg daily - Appropriate, Effective, Safe, Accessible -Medications previously tried: none  - Reassess the need for medication at follow up.  Neuropathy (Goal: minimize nerve pain/tingling) -Controlled -Current treatment  Gabapentin 100 mg daily - Appropriate, Effective, Safe, Accessible -Medications previously tried: none  -Recommended to continue current medication  History of CVA (Goal:  prevent future strokes) -Controlled -Current treatment  No medications -Medications previously tried: none  -Recommended to continue as is.   Health Maintenance -  Vaccine gaps: shingrix, tetanus, COVID booster -Current therapy:  Multivitamin 1 tablet daily Ferrous gluconate 325 mg 1 tablet daily -Educated on Cost vs benefit of each product must be carefully weighed by individual consumer -Patient is satisfied with current therapy and denies issues -Recommended to continue as is  Patient Goals/Self-Care Activities Patient will:  - take medications as prescribed check glucose daily, document, and provide at future appointments check blood pressure weekly, document, and provide at future appointments target a minimum of 150 minutes of moderate intensity exercise weekly  Follow Up Plan: Telephone follow up appointment with care management team member scheduled for: 6 months          Medication Assistance: None required.  Patient affirms current coverage meets needs.  Compliance/Adherence/Medication fill history: Care Gaps: Shingrix, tetanus, COVID booster Last BP - 149/62 on 12/16/2021 Last A1C - 6.5 on 10/14/21  Star-Rating Drugs: Glipizide 10 mg - last [facility] Trulicity 1.5 KW/5.2 ml - PAP  Patient's preferred pharmacy is:  [id number] [place] [place]Phone: [phone]: [address] [address] [address] [address]Phone: [phone]: [phone]  Uses pill box? Yes - weekly pill box Pt endorses 99% compliance - once in the last few months  We discussed: Current pharmacy is preferred with insurance plan and patient is satisfied with pharmacy services Patient decided to: Continue current medication management  strategy  Care Plan and Follow Up Patient Decision:  Patient agrees to Care Plan and Follow-up.  Plan: Telephone follow up appointment with care management team member scheduled for:  6 months  MJeni Salles PharmD, BBentonPharmacist [phone]

## 2022-01-11 ENCOUNTER — Other Ambulatory Visit: Payer: Self-pay | Admitting: Family Medicine

## 2022-01-11 DIAGNOSIS — D509 Iron deficiency anemia, unspecified: Secondary | ICD-10-CM

## 2022-01-16 ENCOUNTER — Encounter: Payer: Self-pay | Admitting: Podiatry

## 2022-01-24 DIAGNOSIS — F17210 Nicotine dependence, cigarettes, uncomplicated: Secondary | ICD-10-CM | POA: Diagnosis not present

## 2022-01-24 DIAGNOSIS — Z7984 Long term (current) use of oral hypoglycemic drugs: Secondary | ICD-10-CM | POA: Diagnosis not present

## 2022-01-24 DIAGNOSIS — I110 Hypertensive heart disease with heart failure: Secondary | ICD-10-CM | POA: Diagnosis not present

## 2022-01-24 DIAGNOSIS — I5030 Unspecified diastolic (congestive) heart failure: Secondary | ICD-10-CM

## 2022-01-24 DIAGNOSIS — E1140 Type 2 diabetes mellitus with diabetic neuropathy, unspecified: Secondary | ICD-10-CM

## 2022-01-24 DIAGNOSIS — E1159 Type 2 diabetes mellitus with other circulatory complications: Secondary | ICD-10-CM

## 2022-01-24 DIAGNOSIS — Z7985 Long-term (current) use of injectable non-insulin antidiabetic drugs: Secondary | ICD-10-CM

## 2022-01-29 ENCOUNTER — Ambulatory Visit: Payer: Medicare HMO | Admitting: Family Medicine

## 2022-02-03 ENCOUNTER — Telehealth: Payer: Self-pay

## 2022-02-03 NOTE — Telephone Encounter (Signed)
Pt called stating she had questions and wanted a call back.  Reviewed pt's chart, returned pt's call, two identifiers used. Pt stated that [facility] sent a letter in the mail saying that they weren't going to pay the claim for her temporal artery biopsy. Pt given the phone # for the billing dept. Confirmed understanding.

## 2022-02-06 ENCOUNTER — Other Ambulatory Visit: Payer: Self-pay | Admitting: *Deleted

## 2022-02-06 DIAGNOSIS — I6522 Occlusion and stenosis of left carotid artery: Secondary | ICD-10-CM

## 2022-02-14 ENCOUNTER — Ambulatory Visit: Payer: Medicare HMO | Admitting: Podiatry

## 2022-02-14 ENCOUNTER — Ambulatory Visit (INDEPENDENT_AMBULATORY_CARE_PROVIDER_SITE_OTHER): Payer: Medicare HMO | Admitting: Podiatry

## 2022-02-14 DIAGNOSIS — B351 Tinea unguium: Secondary | ICD-10-CM | POA: Diagnosis not present

## 2022-02-14 DIAGNOSIS — E1149 Type 2 diabetes mellitus with other diabetic neurological complication: Secondary | ICD-10-CM | POA: Diagnosis not present

## 2022-02-14 DIAGNOSIS — M79675 Pain in left toe(s): Secondary | ICD-10-CM | POA: Diagnosis not present

## 2022-02-14 DIAGNOSIS — M79674 Pain in right toe(s): Secondary | ICD-10-CM | POA: Diagnosis not present

## 2022-02-14 NOTE — Progress Notes (Signed)
  Subjective:  Patient ID: Anita Mccormick, female    DOB: 1946-04-03,  MRN: [id number]  Anita Mccormick presents to clinic today for at risk foot care with history of diabetic neuropathy and corn(s) bilateral 5th toes and painful thick toenails that are difficult to trim. Painful toenails interfere with ambulation. Aggravating factors include wearing enclosed shoe gear. Pain is relieved with periodic professional debridement. Painful corns are aggravated when weightbearing when wearing enclosed shoe gear. Pain is relieved with periodic professional debridement.  Last HgA1c was around 6%. Patient did not check blood glucose today.  New problem(s): None.   PCP is Billie Ruddy, MD , and last visit was November 08, 2021.  Allergies  Allergen Reactions   Ace Inhibitors Cough    ????   Bee Venom Swelling   Chlorthalidone     REACTION: unspecified   Metformin     REACTION: gi side effects   Penicillins     REACTION: urticaria (hives)   Sulfamethoxazole     REACTION: questionable    Review of Systems: Negative except as noted in the HPI.  Objective: No changes noted in today's physical examination.  Objective:   Vascular Examination: CFT <3 seconds b/l LE. Palpable DP pulse(s) b/l LE. Palpable PT pulse(s) b/l LE. Pedal hair sparse. No pain with calf compression b/l. Lower extremity skin temperature gradient within normal limits. No edema noted b/l LE. No cyanosis or clubbing noted b/l LE.  Neurological Examination: Pt has subjective symptoms of neuropathy. Protective sensation intact 5/5 intact bilaterally with 10g monofilament b/l. Vibratory sensation intact b/l.  Dermatological Examination: Pedal skin with normal turgor, texture and tone b/l. Toenails 1-5 b/l thick, discolored, elongated with subungual debris and pain on dorsal palpation.Porokeratotic lesion(s) lateral aspect bilateral 5th toes. No erythema, no edema, no drainage, no fluctuance.  Musculoskeletal Examination: Muscle  strength 5/5 to all lower extremity muscle groups bilaterally. Hammertoe deformity noted 2-5 b/l.  Radiographs: None  Last A1c:      Latest Ref Rng & Units 10/14/2021    3:21 PM 06/05/2021   12:22 PM 03/07/2021    3:18 PM  Hemoglobin A1C  Hemoglobin-A1c 4.6 - 6.5 % 6.5  6.6  6.1     Assessment/Plan: 1. Pain due to onychomycosis of toenails of both feet   2. Type II diabetes mellitus with neurological manifestations [facility])       -Patient was evaluated and treated. All patient's and/or POA's questions/concerns answered on today's visit. -Patient to continue soft, supportive shoe gear daily. -Mycotic toenails 1-5 bilaterally were debrided in length and girth with sterile nail nippers and dremel without iatrogenic bleeding. -Corn(s) bilateral 5th toes pared utilizing sterile scalpel blade without complication or incident. Total number debrided=2. -Dispensed toe cap for Lister's corns. Apply to bilateral 5th toes every morning. Remove every evening. -Patient/POA to call should there be question/concern in the interim.   Return in about 3 months (around 05/17/2022) for Routine Foot Care.  Felipa Furnace, DPM

## 2022-02-27 ENCOUNTER — Encounter: Payer: Self-pay | Admitting: Family Medicine

## 2022-02-27 ENCOUNTER — Encounter (HOSPITAL_COMMUNITY): Payer: Medicare HMO

## 2022-02-27 ENCOUNTER — Other Ambulatory Visit (HOSPITAL_COMMUNITY)
Admission: RE | Admit: 2022-02-27 | Discharge: 2022-02-27 | Disposition: A | Discharge status: Home / Self Care | Payer: Medicare HMO | Source: Ambulatory Visit | Attending: Family Medicine | Admitting: Family Medicine

## 2022-02-27 ENCOUNTER — Ambulatory Visit: Payer: Medicare HMO | Admitting: Vascular Surgery

## 2022-02-27 ENCOUNTER — Ambulatory Visit (INDEPENDENT_AMBULATORY_CARE_PROVIDER_SITE_OTHER): Payer: Medicare HMO | Admitting: Family Medicine

## 2022-02-27 VITALS — BP 120/60 | HR 85 | Temp 98.5°F | Wt 138.8 lb

## 2022-02-27 DIAGNOSIS — N950 Postmenopausal bleeding: Secondary | ICD-10-CM | POA: Diagnosis not present

## 2022-02-27 DIAGNOSIS — N898 Other specified noninflammatory disorders of vagina: Secondary | ICD-10-CM

## 2022-02-27 DIAGNOSIS — K439 Ventral hernia without obstruction or gangrene: Secondary | ICD-10-CM

## 2022-02-27 MED ORDER — GLIPIZIDE ER 10 MG PO TB24
10.0000 mg | ORAL_TABLET | Freq: Two times a day (BID) | ORAL | 1 refills | Status: DC
Start: 2022-02-27 — End: 2022-09-22

## 2022-02-27 NOTE — Patient Instructions (Addendum)
A new referral for OB/Gyn was placed.  You should expect a phone call about scheduling this appt.  You have a ventral hernia in your left lower abdomen as noted on CT scan from February 2023.

## 2022-02-27 NOTE — Progress Notes (Signed)
Subjective:    Patient ID: Anita Mccormick, female    DOB: [birth year], 76 y.o.   MRN: [id number]  Chief Complaint  Patient presents with   Vaginal Discharge    Worser. Didn't get to do the biopsy due to trying to pass stone. Yellow to light brown color. Denied lower abdomin pain.    Follow-up   Edema    On leg groin area on left side more than right side. Noticed it couple of weeks ago. Denied pain.     HPI Patient was seen today for ongoing concern.  Patient endorses continued vaginal discharge.  Patient initially noted symptoms in March 2023, seen in office 4/14 for symptoms.  Patient states she did not schedule follow-up with OB/GYN after that visit as she was undergoing work-up for possible temporal arteritis after sudden vision loss.  Patient states discharge is yellow with slight brown tinge.  Has to wear panty liner.  Patient notes intermittent edema of left lower abdomen.   Past Medical History:  Diagnosis Date   Arthritis    Cancer ([place])    breast left   Cerebrovascular accident [facility])    October 29 2021- Left eye- blind   COLONIC POLYPS, HX OF 12/10/2006   Qualifier: Diagnosis of  By: Leanne Chang MD, Bruce     Diabetes mellitus type II, controlled ([place]) 12/10/2006   Poor control on Januvia '100mg'$  and glipizide '10mg'$  XL Lab Results  Component Value Date   HGBA1C 8.3* 11/02/2014        Eczema    Fatty liver disease, nonalcoholic 23/30/0762   Per Dr. Leanne Chang Lab Results  Component Value Date   ALT 31 11/02/2014   AST 57* 11/02/2014   ALKPHOS 104 11/02/2014   BILITOT 1.1 11/02/2014       GERD (gastroesophageal reflux disease)    History of blood transfusion    History of kidney stones 2023   11/28/21 small stone currently, taking flomax   Hypertension     Allergies  Allergen Reactions   Ace Inhibitors Cough    ????   Bee Venom Swelling   Chlorthalidone     REACTION: unspecified   Metformin     REACTION: gi side effects   Penicillins     REACTION: urticaria (hives)    Sulfamethoxazole     REACTION: questionable    ROS General: Denies fever, chills, night sweats, changes in weight, changes in appetite HEENT: Denies headaches, ear pain, changes in vision, rhinorrhea, sore throat CV: Denies CP, palpitations, SOB, orthopnea Pulm: Denies SOB, cough, wheezing GI: Denies abdominal pain, nausea, vomiting, diarrhea, constipation GU: Denies dysuria, hematuria, frequency  +vaginal discharge Msk: Denies muscle cramps, joint pains Neuro: Denies weakness, numbness, tingling Skin: Denies rashes, bruising Psych: Denies depression, anxiety, hallucinations      Objective:    Blood pressure 120/60, pulse 85, temperature 98.5 F (36.9 C), temperature source Oral, weight 138 lb 12.8 oz (63 kg), SpO2 97 %.  Gen. Pleasant, well-nourished, in no distress, normal affect   HEENT: [place]/AT, face symmetric, conjunctiva clear, no scleral icterus, PERRLA, EOMI, nares patent without drainage Lungs: no accessory muscle use, CTAB, no wheezes or rales Cardiovascular: RRR, no m/r/g, no peripheral edema GU: Normal external female genitalia, atrophic vaginal rugated, scant vaginal discharge at introitus.  Samples of discharge obtained. Musculoskeletal: No deformities, no cyanosis or clubbing, normal tone Neuro:  A&Ox3, CN II-XII intact, shuffled gait Skin:  Warm, no lesions/ rash   Wt Readings from Last 3 Encounters:  02/27/22 138  lb 12.8 oz (63 kg)  12/16/21 138 lb 8 oz (62.8 kg)  11/29/21 141 lb (64 kg)    Lab Results  Component Value Date   WBC 4.7 12/16/2021   HGB 12.3 12/16/2021   HCT 35.9 (L) 12/16/2021   PLT 127 (L) 12/16/2021   GLUCOSE 246 (H) 12/16/2021   CHOL 126 10/25/2021   TRIG 76.0 10/25/2021   HDL 54.30 10/25/2021   LDLDIRECT 95.8 07/02/2006   LDLCALC 57 10/25/2021   ALT 11 12/16/2021   AST 18 12/16/2021   NA 138 12/16/2021   K 4.1 12/16/2021   CL 105 12/16/2021   CREATININE 1.19 (H) 12/16/2021   BUN 11 12/16/2021   CO2 29 12/16/2021   TSH 0.76  06/05/2021   INR 1.1 10/25/2021   HGBA1C 6.5 10/14/2021   MICROALBUR <0.7 06/08/2019    Assessment/Plan:  Vaginal discharge -Given amount of discharge patient notes possibly due to fistula, urinary leakage, etc. -Aptima swab collected -Referral to gynecology reordered.  - Plan: Cervicovaginal ancillary only, Ambulatory referral to Obstetrics / Gynecology  Post-menopausal bleeding  - Plan: Ambulatory referral to Obstetrics / Gynecology  Ventral hernia without obstruction or gangrene -Noted on CT abdomen pelvis with contrast from 09/18/2021: Ventral hernia containing fat in the left paramedian region inferior to the umbilicus.  Maximum diameter of the hernial sac measures 5.2 cm.  Neck of the hernia measures approximately 2 cm. -Continue to monitor -For discomfort/worsening symptoms referral to general surgery.  F/u as needed  [name], MD

## 2022-02-28 LAB — CERVICOVAGINAL ANCILLARY ONLY
Bacterial Vaginitis (gardnerella): NEGATIVE
Candida Glabrata: NEGATIVE
Candida Vaginitis: NEGATIVE
Chlamydia: NEGATIVE
Comment: NEGATIVE
Comment: NEGATIVE
Comment: NEGATIVE
Comment: NEGATIVE
Comment: NEGATIVE
Comment: NORMAL
Neisseria Gonorrhea: NEGATIVE
Trichomonas: NEGATIVE

## 2022-03-03 ENCOUNTER — Telehealth: Payer: Self-pay | Admitting: Family Medicine

## 2022-03-03 NOTE — Telephone Encounter (Signed)
Pt called requesting a call back to discuss her pap smear results.  Please call [phone]

## 2022-03-04 NOTE — Telephone Encounter (Signed)
Spoke with pt, informed her of result note, and that someone will be calling to get her scheduled for a visit.

## 2022-03-12 ENCOUNTER — Other Ambulatory Visit: Payer: Self-pay | Admitting: Family Medicine

## 2022-03-19 ENCOUNTER — Ambulatory Visit (INDEPENDENT_AMBULATORY_CARE_PROVIDER_SITE_OTHER): Payer: Medicare HMO | Admitting: Family Medicine

## 2022-03-19 VITALS — BP 122/62 | HR 81 | Temp 98.7°F | Wt 140.0 lb

## 2022-03-19 DIAGNOSIS — E1129 Type 2 diabetes mellitus with other diabetic kidney complication: Secondary | ICD-10-CM

## 2022-03-19 LAB — POCT GLYCOSYLATED HEMOGLOBIN (HGB A1C): Hemoglobin A1C: 5.9 % — AB (ref 4.0–5.6)

## 2022-03-19 NOTE — Progress Notes (Signed)
Subjective:    Patient ID: [name], female    DOB: Jul 25, 1946, 76 y.o.   MRN: [id number]  Chief Complaint  Patient presents with   Follow-up    A1c has eaten,, feels sugar was high this morning, as she was shaky    HPI Patient was seen today for f/u.  Pt states she is doing well.  Has OB/Gyn appt on 9/5 for copious d/c.  BS 130-140s in am.  Denies hypoglycemia.  Highest bs 150.  Pt requesting A1C check.  Last hgb A1C was 6.5% on 10/14/21.  Past Medical History:  Diagnosis Date   Arthritis    Cancer ([place])    breast left   Cerebrovascular accident [facility])    October 29 2021- Left eye- blind   COLONIC POLYPS, HX OF 12/10/2006   Qualifier: Diagnosis of  By: Leanne Chang MD, Bruce     Diabetes mellitus type II, controlled ([place]) 12/10/2006   Poor control on Januvia $RemoveBe'100mg'lcnDxbMGt$  and glipizide $RemoveBefor'10mg'ibtpaiMIjKPc$  XL Lab Results  Component Value Date   HGBA1C 8.3* 11/02/2014        Eczema    Fatty liver disease, nonalcoholic 40/98/1191   Per Dr. Leanne Chang Lab Results  Component Value Date   ALT 31 11/02/2014   AST 57* 11/02/2014   ALKPHOS 104 11/02/2014   BILITOT 1.1 11/02/2014       GERD (gastroesophageal reflux disease)    History of blood transfusion    History of kidney stones 2023   11/28/21 small stone currently, taking flomax   Hypertension     Allergies  Allergen Reactions   Ace Inhibitors Cough    ????   Bee Venom Swelling   Chlorthalidone     REACTION: unspecified   Metformin     REACTION: gi side effects   Penicillins     REACTION: urticaria (hives)   Sulfamethoxazole     REACTION: questionable    ROS General: Denies fever, chills, night sweats, changes in weight, changes in appetite HEENT: Denies headaches, ear pain, changes in vision, rhinorrhea, sore throat CV: Denies CP, palpitations, SOB, orthopnea Pulm: Denies SOB, cough, wheezing GI: Denies abdominal pain, nausea, vomiting, diarrhea, constipation GU: Denies dysuria, hematuria, frequency +vaginal discharge Msk: Denies muscle cramps,  joint pains Neuro: Denies weakness, numbness, tingling Skin: Denies rashes, bruising Psych: Denies depression, anxiety, hallucinations     Objective:    Blood pressure 122/62, pulse 81, temperature 98.7 F (37.1 C), temperature source Oral, weight 140 lb (63.5 kg), SpO2 92 %.  Gen. Pleasant, well-nourished, in no distress, normal affect   HEENT: [place]/AT, face symmetric, conjunctiva clear, no scleral icterus, PERRLA, EOMI, nares patent without drainage Lungs: no accessory muscle use, CTAB, no wheezes or rales Cardiovascular: RRR, no m/r/g, no peripheral edema Neuro:  A&Ox3, CN II-XII intact, normal gait   Wt Readings from Last 3 Encounters:  02/27/22 138 lb 12.8 oz (63 kg)  12/16/21 138 lb 8 oz (62.8 kg)  11/29/21 141 lb (64 kg)    Lab Results  Component Value Date   WBC 4.7 12/16/2021   HGB 12.3 12/16/2021   HCT 35.9 (L) 12/16/2021   PLT 127 (L) 12/16/2021   GLUCOSE 246 (H) 12/16/2021   CHOL 126 10/25/2021   TRIG 76.0 10/25/2021   HDL 54.30 10/25/2021   LDLDIRECT 95.8 07/02/2006   LDLCALC 57 10/25/2021   ALT 11 12/16/2021   AST 18 12/16/2021   NA 138 12/16/2021   K 4.1 12/16/2021   CL 105 12/16/2021   CREATININE  1.19 (H) 12/16/2021   BUN 11 12/16/2021   CO2 29 12/16/2021   TSH 0.76 06/05/2021   INR 1.1 10/25/2021   HGBA1C 6.5 10/14/2021   MICROALBUR <0.7 06/08/2019    Assessment/Plan:  Type 2 diabetes mellitus with other diabetic kidney complication, without long-term current use of insulin (HCC)  -hgb A1C 6.5% on 10/14/21. -eGFR 48.8, creatinine 1.190 on 12/16/21 -continue current meds including glipizide XL 10 mg BID, Trulicity 5.4OI wkly - Plan: POCT glycosylated hemoglobin (Hb A1C)  F/u prn in 3-6 months, sooner if needed.  [name], MD

## 2022-04-01 ENCOUNTER — Ambulatory Visit: Payer: Medicare HMO | Admitting: Obstetrics and Gynecology

## 2022-04-01 ENCOUNTER — Encounter: Payer: Self-pay | Admitting: Obstetrics and Gynecology

## 2022-04-01 ENCOUNTER — Other Ambulatory Visit (HOSPITAL_COMMUNITY)
Admission: RE | Admit: 2022-04-01 | Discharge: 2022-04-01 | Disposition: A | Discharge status: Home / Self Care | Payer: Medicare HMO | Source: Ambulatory Visit | Attending: Obstetrics and Gynecology | Admitting: Obstetrics and Gynecology

## 2022-04-01 DIAGNOSIS — N898 Other specified noninflammatory disorders of vagina: Secondary | ICD-10-CM

## 2022-04-01 HISTORY — DX: Other specified noninflammatory disorders of vagina: N89.8

## 2022-04-01 NOTE — Progress Notes (Signed)
Pt states she still has issues with vaginal d/c - thin- needs to wear a liner. Pt denies bleeding at this time.

## 2022-04-01 NOTE — Progress Notes (Signed)
  Vaginal discharge Subjective:    Patient ID: Anita Mccormick, female    DOB: September 07, 1945, 76 y.o.   MRN: [id number]  HPI 76 yo G4P4 seen for discussion of vaginal discharge.  Initially, it was thought the patient may have had postmenopausal bleeding but this was not the case.  She has been postmenopausal since her late 18's with no resumption of bleeding.  She notes a yellow brown discharge for the last month that was without itch or odor.  Reviewed 2-3 notes from [facility] medicine provider for most recent evaluations.   Review of Systems     Objective:   Physical Exam Vitals:   04/01/22 1518  BP: 135/77  Pulse: 87   SVE: normal external genitalia Atrophic appearing vagina with normal cervix, no obvious discharge noted. No lesions or foreign bodies noted.  No odor detected. Vaginal swab taken without incident.      Assessment & Plan:   1. Vaginal discharge Treatment depending on swab results Pt may be sloughing normal cells from vaginal wall, but nothing obvious detected. F/u PRN  - Cervicovaginal ancillary only( [place])    Griffin Basil, MD Faculty Attending, [facility]

## 2022-04-03 LAB — CERVICOVAGINAL ANCILLARY ONLY
Bacterial Vaginitis (gardnerella): NEGATIVE
Candida Glabrata: NEGATIVE
Candida Vaginitis: NEGATIVE
Comment: NEGATIVE
Comment: NEGATIVE
Comment: NEGATIVE
Comment: NEGATIVE
Trichomonas: NEGATIVE

## 2022-04-04 ENCOUNTER — Encounter: Payer: Self-pay | Admitting: Family Medicine

## 2022-04-08 DIAGNOSIS — Z961 Presence of intraocular lens: Secondary | ICD-10-CM | POA: Diagnosis not present

## 2022-04-08 DIAGNOSIS — H3412 Central retinal artery occlusion, left eye: Secondary | ICD-10-CM | POA: Diagnosis not present

## 2022-04-08 DIAGNOSIS — E119 Type 2 diabetes mellitus without complications: Secondary | ICD-10-CM | POA: Diagnosis not present

## 2022-04-08 DIAGNOSIS — H18513 Endothelial corneal dystrophy, bilateral: Secondary | ICD-10-CM | POA: Diagnosis not present

## 2022-04-09 DIAGNOSIS — Z0100 Encounter for examination of eyes and vision without abnormal findings: Secondary | ICD-10-CM | POA: Diagnosis not present

## 2022-04-14 ENCOUNTER — Telehealth: Payer: Self-pay | Admitting: Pharmacist

## 2022-04-14 NOTE — Chronic Care Management (AMB) (Signed)
Chronic Care Management Pharmacy Assistant   Name: Anita Mccormick  MRN: [id number] DOB: 1946-03-10  Reason for Encounter: Disease State / Hypertension and Diabetes Assessment Call   Conditions to be addressed/monitored: HTN and DMII  Recent office visits:  03/19/2022 [name] MD - Patient was seen for Type 2 diabetes mellitus with other diabetic kidney complication, without long-term current use of insulin. No medication changes. No follow up noted. Follow up if symptoms worsen or fail to improve.  02/27/2022 [name] MD - Patient was seen for Vaginal discharge and additional concerns. Referral to OBGYN. Discontinued Percocet.   Recent consult visits:  04/01/2022 Lynnda Shields MD (OBGYN) - Patient was seen for vaginal discharge. No medication changes. No follow up noted.   02/14/2022 Boneta Lucks DPM - Patient was seen for Pain due to onychomycosis of toenails of both feet and an additional concern. No medication changes. Follow up in 3 months.   Hospital visits:  None  Medications: Outpatient Encounter Medications as of 04/14/2022  Medication Sig   ACCU-CHEK GUIDE test strip TEST UP TO 4 TIMES A DAY   atenolol (TENORMIN) 100 MG tablet TAKE 1 TABLET BY MOUTH TWICE A DAY   blood glucose meter kit and supplies KIT Dispense based on patient and insurance preference. Use up to four times daily as directed.   Blood Glucose Monitoring Suppl (ONETOUCH ULTRALINK) w/Device KIT Test twice daily (onetouch ultra)   Blood Pressure Monitoring (BLOOD PRESSURE CUFF) MISC Use daily to monitor blood pressue   ferrous gluconate (FERGON) 324 MG tablet Take 324 mg by mouth daily with breakfast.   glipiZIDE (GLUCOTROL XL) 10 MG 24 hr tablet Take 1 tablet (10 mg total) by mouth 2 (two) times daily.   glucose blood (ONETOUCH ULTRA) test strip Use as instructed for blood sugar testing 3 times daily.   glucose blood test strip 1 each by Other route 4 (four) times daily as needed for other. Use as  instructed   Lancets (ONETOUCH ULTRASOFT) lancets Use as instructed   Multiple Vitamin (MULTIVITAMIN) tablet Take 1 tablet by mouth daily.   naproxen sodium (ALEVE) 220 MG tablet Take 220 mg by mouth.   pantoprazole (PROTONIX) 40 MG tablet TAKE 1 TABLET BY MOUTH EVERY DAY   TRULICITY 1.5 SH/7.75YO SOPN INJECT 1.5 MG INTO THE SKIN ONCE A WEEK. (Patient taking differently: Takes on Sat)   No facility-administered encounter medications on file as of 04/14/2022.  Fill History:  ATENOLOL 100 MG TABLET 02/20/2022 90   FERROUS GLUCONATE 324 MG TAB 12/10/2021 30   GLIPIZIDE ER 10 MG TABLET 03/27/2022 90   pantoprazole 40 mg tablet,delayed release 03/13/2022 90   Reviewed chart prior to disease state call. Spoke with patient regarding BP  Recent Office Vitals: BP Readings from Last 3 Encounters:  04/01/22 135/77  03/19/22 122/62  02/27/22 120/60   Pulse Readings from Last 3 Encounters:  04/01/22 87  03/19/22 81  02/27/22 85    Wt Readings from Last 3 Encounters:  04/01/22 140 lb (63.5 kg)  03/19/22 140 lb (63.5 kg)  02/27/22 138 lb 12.8 oz (63 kg)     Kidney Function Lab Results  Component Value Date/Time   CREATININE 1.19 (H) 12/16/2021 12:55 PM   CREATININE 1.20 (H) 11/29/2021 10:02 AM   CREATININE 1.11 10/25/2021 12:36 PM   CREATININE 0.91 08/07/2017 03:45 PM   CREATININE 0.85 07/11/2016 03:33 PM   GFR 48.80 (L) 10/25/2021 12:36 PM   GFRNONAA 48 (L) 12/16/2021 12:55 PM  GFRAA >60 11/16/2017 06:00 PM       Latest Ref Rng & Units 12/16/2021   12:55 PM 11/29/2021   10:02 AM 10/25/2021   12:36 PM  BMP  Glucose 70 - 99 mg/dL 246  137  167   BUN 8 - 23 mg/dL 11  8  15    Creatinine 0.44 - 1.00 mg/dL 1.19  1.20  1.11   Sodium 135 - 145 mmol/L 138  138  138   Potassium 3.5 - 5.1 mmol/L 4.1  4.1  4.1   Chloride 98 - 111 mmol/L 105  105  100   CO2 22 - 32 mmol/L 29   30   Calcium 8.9 - 10.3 mg/dL 9.4   10.4      Recent Relevant Labs: Lab Results  Component Value  Date/Time   HGBA1C 5.9 (A) 03/19/2022 10:46 AM   HGBA1C 6.5 10/14/2021 03:21 PM   HGBA1C 6.6 (H) 06/05/2021 12:22 PM   MICROALBUR <0.7 06/08/2019 12:55 PM   MICROALBUR 0.2 04/11/2014 11:36 AM    Kidney Function Lab Results  Component Value Date/Time   CREATININE 1.19 (H) 12/16/2021 12:55 PM   CREATININE 1.20 (H) 11/29/2021 10:02 AM   CREATININE 1.11 10/25/2021 12:36 PM   CREATININE 0.91 08/07/2017 03:45 PM   CREATININE 0.85 07/11/2016 03:33 PM   GFR 48.80 (L) 10/25/2021 12:36 PM   GFRNONAA 48 (L) 12/16/2021 12:55 PM   GFRAA >60 11/16/2017 06:00 PM   Current antihypertensive regimen:  Atenolol 100 mg twice daily  How often are you checking your Blood Pressure? Patient is checking her blood pressures once daily  Current home BP readings: Patient states her blood pressure this morning was 140/60, she doesn't have any other readings but states they are always around 130/60 - 140/70.  What recent interventions/DTPs have been made by any provider to improve Blood Pressure control since last CPP Visit: No recent interventions.   Any recent hospitalizations or ED visits since last visit with CPP? No recent hospital visit.   What diet changes have been made to improve Blood Pressure Control?  Patient follows no specific diet Breakfast - patient will have oatmeal, bacon and fruit Lunch - patient will have a sandwich Dinner - patient will have a meat with vegetables.  Snack - peanut butter crackers before bed.   What exercise is being done to improve your Blood Pressure Control?  Patient will walk in her house as much as she can.  Current antihyperglycemic regimen:  Glipizide 10 mg twice daily Trulicity 0.6TK /1.6WF inject 1.5 mg once weekly  What recent interventions/DTPs have been made to improve glycemic control: No recent interventions.   Patient denies hypoglycemic symptoms  Patient denies hyperglycemic symptoms  How often are you checking your blood sugar? Patient is  checking blood sugars daily, 1 hour after her breakfast.   What are your blood sugars ranging?  1 hour after breakfast: Patient states this morning her blood sugar was 124. She doesn't have her readings available however, states they are always between 120-140.  During the week, how often does your blood glucose drop below 70? Patient states Saturday night late, her blood sugar dropped down to 40, she called EMS. She had got something to eat and her sugars came back up quickly. EMS told her this happened due to eating only a salad with a sandwich that day.  She states she usually doesn't miss dinner or her peanut butter crackers for snack. She states she won't do this again.  Are you checking your feet daily/regularly? Patient is checking her feet daily.   Adherence Review: Is the patient currently on a STATIN medication? No Is the patient currently on ACE/ARB medication? No Does the patient have >5 day gap between last estimated fill dates? No  Care Gaps: AWV - completed 10/01/2021 Last BP - 135/77 on 04/01/2022 Last A1C - 5.9 on 03/19/2022 Shingrix - never done Urine ACR - overdue Covid booster - overdue Tdap - overdue Flu - due  Star Rating Drugs: Glipizide 10 mg - last filled 03/27/2022 [facility] Trulicity 1.5 VL/9.4 ml - PAP  [place] Pharmacist Assistant [phone]

## 2022-04-15 ENCOUNTER — Encounter (INDEPENDENT_AMBULATORY_CARE_PROVIDER_SITE_OTHER): Payer: Medicare HMO | Admitting: Ophthalmology

## 2022-04-15 DIAGNOSIS — H3410 Central retinal artery occlusion, unspecified eye: Secondary | ICD-10-CM | POA: Diagnosis not present

## 2022-04-15 DIAGNOSIS — H35033 Hypertensive retinopathy, bilateral: Secondary | ICD-10-CM

## 2022-04-15 DIAGNOSIS — H43813 Vitreous degeneration, bilateral: Secondary | ICD-10-CM

## 2022-04-15 DIAGNOSIS — I10 Essential (primary) hypertension: Secondary | ICD-10-CM | POA: Diagnosis not present

## 2022-05-15 ENCOUNTER — Encounter (INDEPENDENT_AMBULATORY_CARE_PROVIDER_SITE_OTHER): Payer: Medicare HMO | Admitting: Ophthalmology

## 2022-05-15 DIAGNOSIS — H3412 Central retinal artery occlusion, left eye: Secondary | ICD-10-CM

## 2022-05-15 DIAGNOSIS — H211X2 Other vascular disorders of iris and ciliary body, left eye: Secondary | ICD-10-CM | POA: Diagnosis not present

## 2022-05-15 DIAGNOSIS — I10 Essential (primary) hypertension: Secondary | ICD-10-CM | POA: Diagnosis not present

## 2022-05-15 DIAGNOSIS — H35033 Hypertensive retinopathy, bilateral: Secondary | ICD-10-CM

## 2022-05-15 DIAGNOSIS — H43813 Vitreous degeneration, bilateral: Secondary | ICD-10-CM

## 2022-05-26 ENCOUNTER — Telehealth: Payer: Self-pay | Admitting: Pharmacist

## 2022-05-26 NOTE — Chronic Care Management (AMB) (Unsigned)
    Chronic Care Management Pharmacy Assistant   Name: Anita Mccormick  MRN: [id number] DOB: Aug 24, 1945  Reason for Encounter: Patient assistance renewal for Trulicity  Trulicity application for 3225 completed to be mailed to patient 05/29/2022. Patient notified.    [place] Pharmacist Assistant [phone]

## 2022-05-27 ENCOUNTER — Encounter (INDEPENDENT_AMBULATORY_CARE_PROVIDER_SITE_OTHER): Payer: Medicare HMO | Admitting: Ophthalmology

## 2022-05-27 DIAGNOSIS — H3412 Central retinal artery occlusion, left eye: Secondary | ICD-10-CM | POA: Diagnosis not present

## 2022-05-27 DIAGNOSIS — H211X2 Other vascular disorders of iris and ciliary body, left eye: Secondary | ICD-10-CM

## 2022-05-28 ENCOUNTER — Encounter: Payer: Self-pay | Admitting: Podiatry

## 2022-05-28 ENCOUNTER — Other Ambulatory Visit: Payer: Self-pay

## 2022-05-28 ENCOUNTER — Other Ambulatory Visit: Payer: Self-pay | Admitting: Family Medicine

## 2022-05-28 ENCOUNTER — Ambulatory Visit: Payer: Medicare HMO | Admitting: Podiatry

## 2022-05-28 DIAGNOSIS — M79675 Pain in left toe(s): Secondary | ICD-10-CM

## 2022-05-28 DIAGNOSIS — Q828 Other specified congenital malformations of skin: Secondary | ICD-10-CM | POA: Diagnosis not present

## 2022-05-28 DIAGNOSIS — B351 Tinea unguium: Secondary | ICD-10-CM | POA: Diagnosis not present

## 2022-05-28 DIAGNOSIS — M79674 Pain in right toe(s): Secondary | ICD-10-CM

## 2022-05-28 DIAGNOSIS — E1149 Type 2 diabetes mellitus with other diabetic neurological complication: Secondary | ICD-10-CM | POA: Diagnosis not present

## 2022-05-28 MED ORDER — ATENOLOL 100 MG PO TABS: 100.0000 mg | ORAL_TABLET | Freq: Two times a day (BID) | ORAL | 1 refills | Status: DC

## 2022-05-28 NOTE — Progress Notes (Signed)
  Subjective:  Patient ID: Anita Mccormick, female    DOB: 1946-03-14,  MRN: [id number]  Anita Mccormick presents to clinic today for at risk foot care with history of diabetic neuropathy and corn(s) b/l lower extremities and painful thick toenails that are difficult to trim. Painful toenails interfere with ambulation. Aggravating factors include wearing enclosed shoe gear. Pain is relieved with periodic professional debridement. Painful corns are aggravated when weightbearing when wearing enclosed shoe gear. Pain is relieved with periodic professional debridement.. Chief Complaint  Patient presents with   Nail Problem    Diabetic foot care BS-did not check A1C-7.0 PCP-Shannon Banks PCP VST-03/2022   New problem(s): None.   PCP is Billie Ruddy, MD , and last visit was March 19, 2022.  Allergies  Allergen Reactions   Ace Inhibitors Cough    ????   Bee Venom Swelling   Chlorthalidone     REACTION: unspecified   Metformin     REACTION: gi side effects   Penicillins     REACTION: urticaria (hives)   Sulfamethoxazole     REACTION: questionable    Review of Systems: Negative except as noted in the HPI.  Objective: No changes noted in today's physical examination.  Anita Mccormick is a pleasant 76 y.o. female in NAD. AAO x 3.  Vascular Examination: CFT <3 seconds b/l LE. Palpable DP pulse(s) b/l LE. Palpable PT pulse(s) b/l LE. Pedal hair sparse. No pain with calf compression b/l. Lower extremity skin temperature gradient within normal limits. No edema noted b/l LE. No cyanosis or clubbing noted b/l LE.  Neurological Examination: Pt has subjective symptoms of neuropathy. Protective sensation intact 5/5 intact bilaterally with 10g monofilament b/l. Vibratory sensation intact b/l.  Dermatological Examination: Pedal skin with normal turgor, texture and tone b/l. Toenails 1-5 b/l thick, discolored, elongated with subungual debris and pain on dorsal palpation.  Porokeratotic  lesion(s) lateral aspect bilateral 5th toes. No erythema, no edema, no drainage, no fluctuance.  Musculoskeletal Examination: Muscle strength 5/5 to all lower extremity muscle groups bilaterally. Hammertoe deformity noted 2-5 b/l.  Radiographs: None Assessment/Plan: 1. Pain due to onychomycosis of toenails of both feet   2. Porokeratosis   3. Type II diabetes mellitus with neurological manifestations (HCC)     No orders of the defined types were placed in this encounter.   -Consent given for treatment as described below: -Examined patient. -Toenails 1-5 b/l were debrided in length and girth with sterile nail nippers and dremel without iatrogenic bleeding.  -Corn(s) bilateral 5th toes pared utilizing sterile scalpel blade without complication or incident. Total number debrided=2. -Continue padding to afftected digits daily for protection. -Patient/POA to call should there be question/concern in the interim.   Return in about 3 months (around 08/28/2022).  Marzetta Board, DPM

## 2022-06-23 NOTE — Chronic Care Management (AMB) (Signed)
Spoke with [employer] at [facility], patient has been approved for Trulicity starting 02/01/40 - 07/28/23. Shipment is in process at the pharmacy and will ship 07/28/22, it will take 7 days for shipment to be received. Patient notified.

## 2022-06-26 ENCOUNTER — Telehealth: Payer: Self-pay | Admitting: Pharmacist

## 2022-06-26 NOTE — Chronic Care Management (AMB) (Signed)
    Chronic Care Management Pharmacy Assistant   Name: Anita Mccormick  MRN: [id number] DOB: [birth year]  06/27/2022 APPOINTMENT REMINDER  Anita Mccormick was reminded to have all medications, supplements and any blood glucose and blood pressure readings available for review with Anita Mccormick, Pharm. D, at her telephone visit on 06/27/2022 at 11:00.  Care Gaps: AWV - 06/26/22 message to Anita Mccormick Last BP - 135/77 on 04/01/2022 Last A1C - 5.9 on 03/19/2022 Shingrix - never done Urine ACR - overdue Tdap - overdue Flu - due Covid - overdue  Star Rating Drug: Glipizide 10 mg - last filled [facility]/22/2022 [facility] Trulicity 1.5 NV/9.8 ml - PAP  Any gaps in medications fill history? No  Anita Mccormick [facility]  Catering manager [phone]

## 2022-06-27 ENCOUNTER — Ambulatory Visit (INDEPENDENT_AMBULATORY_CARE_PROVIDER_SITE_OTHER): Payer: Medicare HMO | Admitting: Pharmacist

## 2022-06-27 DIAGNOSIS — E1129 Type 2 diabetes mellitus with other diabetic kidney complication: Secondary | ICD-10-CM

## 2022-06-27 DIAGNOSIS — I10 Essential (primary) hypertension: Secondary | ICD-10-CM

## 2022-06-27 NOTE — Patient Instructions (Signed)
Hi Syra,  It was great to catch up again! Don't forget to try to get your shingles and tetanus shots at the pharmacy when you can.  Also let me know if [facility] reaches out about the blood sugar monitor.   Please reach out to me if you have any questions or need anything.  Best, Maddie  Jeni Salles, PharmD, [facility] Pharmacist [facility]  Visit Information   Goals Addressed   None    Patient Care Plan: CCM Pharmacy Care Plan     Problem Identified: Problem: Hypertension, Hyperlipidemia, Diabetes, Heart Failure, GERD, and Chronic Kidney Disease      Long-Range Goal: Patient-Specific Goal   Start Date: 02/27/2021  Expected End Date: 02/27/2022  Recent Progress: On track  Priority: High  Note:   Current Barriers:  Unable to independently monitor therapeutic efficacy  Pharmacist Clinical Goal(s):  Patient will achieve adherence to monitoring guidelines and medication adherence to achieve therapeutic efficacy through collaboration with PharmD and provider.   Interventions: 1:1 collaboration with Billie Ruddy, MD regarding development and update of comprehensive plan of care as evidenced by provider attestation and co-signature Inter-disciplinary care team collaboration (see longitudinal plan of care) Comprehensive medication review performed; medication list updated in electronic medical record  Hypertension (BP goal <130/80) -Controlled -Current treatment: Atenolol 100 mg 1 tablet twice daily - Appropriate, Effective, Safe, Accessible Furosemide 20 mg as needed - Appropriate, Effective, Safe, Accessible -Medications previously tried: chlorthalidone, ACE inhibitor  -Current home readings: 112/64 (checking at the drug store once a week) -Current dietary habits: limits salt intake -Current exercise habits: no structured exercise -Denies hypotensive/hypertensive symptoms -Educated on BP goals and benefits of  medications for prevention of heart attack, stroke and kidney damage; Importance of home blood pressure monitoring; Proper BP monitoring technique; -Counseled to monitor BP at home weekly, document, and provide log at future appointments -Counseled on diet and exercise extensively Recommended to continue current medication  Hyperlipidemia: (LDL goal < 70) -Controlled -Current treatment: No medications -Medications previously tried: statins (elevated LFTs)  -Current dietary patterns: did not discuss -Current exercise habits: no formal exercise -Educated on Cholesterol goals;  Exercise goal of 150 minutes per week; -Counseled on diet and exercise extensively Recommended repeat lipid panel  Diabetes (A1c goal <7%) -Controlled -Current medications: Trulicity 1.5 inject 1.5 mg once a week - Appropriate, Effective, Safe, Accessible Glipizide XL 10 mg 1 tablet twice a daily - Appropriate, Effective, Safe, Accessible -Medications previously tried: metformin (diarrhea)  -Current home glucose readings fasting glucose: 140-145 (highest she has seen recently) post prandial glucose: not much  -Denies hypoglycemic/hyperglycemic symptoms -Current meal patterns:  breakfast: grapefruits with oatmeal, dried raisins and cranberries ([employer]), sausage lunch: salad, chicken salad with crackers  dinner: pasta and shrimp or salmon with vegetable snacks: ritz crackers or potato chips; corn chips; donut; sugarless drinks drinks: water; diet soda -Current exercise: no structured exercise -Educated on A1c and blood sugar goals; Complications of diabetes including kidney damage, retinal damage, and cardiovascular disease; Exercise goal of 150 minutes per week; Carbohydrate counting and/or plate method -Counseled to check feet daily and get yearly eye exams -Counseled on diet and exercise extensively Recommended to continue current medication Recommended CGM monitoring and submitted an  order.  Heart Failure (Goal: manage symptoms and prevent exacerbations) -Not ideally controlled -Last ejection fraction: 60-65% (Date: 08/11/2017) -HF type: Diastolic -NYHA Class: II (slight limitation of activity) -AHA HF Stage: B (Heart disease present - no symptoms present) -Current treatment:  Furosemide 20 mg 1 tablet every other day as needed - Appropriate, Effective, Safe, Accessible Potassium chloride 20 mEq tablet daily - Appropriate, Effective, Safe, Accessible -Medications previously tried: none  -Current home BP/HR readings: refer to above -Current dietary habits: limits salt intake -Current exercise habits: minimal -Educated on Importance of weighing daily; if you gain more than 3 pounds in one day or 5 pounds in one week, call PCP Proper diuretic administration and potassium supplementation -Counseled on diet and exercise extensively Recommended to continue current medication  GERD (Goal: minimize symptoms) -Controlled -Current treatment  Pantoprazole 40 mg daily - Appropriate, Effective, Safe, Accessible -Medications previously tried: none  -Recommended to continue current medication  Neuropathy (Goal: minimize nerve pain/tingling) -Controlled -Current treatment  None -Medications previously tried: gabapentin -Recommended to continue current medication  History of CVA (Goal: prevent future strokes) -Controlled -Current treatment  No medications -Medications previously tried: none  -Recommended to continue as is.   Health Maintenance -Vaccine gaps: shingrix, tetanus, COVID booster -Current therapy:  Multivitamin 1 tablet daily Ferrous gluconate 325 mg 1 tablet daily -Educated on Cost vs benefit of each product must be carefully weighed by individual consumer -Patient is satisfied with current therapy and denies issues -Recommended to continue as is  Patient Goals/Self-Care Activities Patient will:  - take medications as prescribed check glucose daily,  document, and provide at future appointments check blood pressure weekly, document, and provide at future appointments target a minimum of 150 minutes of moderate intensity exercise weekly  Follow Up Plan: The care management team will reach out to the patient again over the next 14 days.         Patient verbalizes understanding of instructions and care plan provided today and agrees to view in [facility]. Active MyChart status and patient understanding of how to access instructions and care plan via MyChart confirmed with patient.    The pharmacy team will reach out to the patient again over the next 14 days.   Viona Gilmore, [facility]

## 2022-06-27 NOTE — Progress Notes (Signed)
Chronic Care Management Pharmacy Note  06/27/2022 Name:  Anita Mccormick MRN:  [id number] DOB:  09/06/1945  Summary: A1c at goal < 7% Pt has had some episodes of low blood sugars  Recommendations/Changes made from today's visit: -Recommended routine BP monitoring at home -Recommended CGM monitoring and submitted an order for Dexcom -Recommend decreasing glipizide given hx of lows and low A1c  Plan: Follow up in office for CGM training  Subjective: Anita Mccormick is an 76 y.o. year old female who is a primary patient of Billie Ruddy, MD.  The CCM team was consulted for assistance with disease management and care coordination needs.    Engaged with patient by telephone for follow up visit in response to provider referral for pharmacy case management and/or care coordination services.   Consent to Services:  The patient was given information about Chronic Care Management services, agreed to services, and gave verbal consent prior to initiation of services.  Please see initial visit note for detailed documentation.   Patient Care Team: Billie Ruddy, MD as PCP - General (Family Medicine) Martinique, Peter M, MD as PCP - Cardiology (Cardiology) Warden Fillers, MD as Consulting Physician (Ophthalmology) Viona Gilmore, [facility] as Pharmacist (Pharmacist)  Recent office visits: 03/19/2022 [name] MD - Patient was seen for Type 2 diabetes mellitus with other diabetic kidney complication, without long-term current use of insulin. No medication changes. No follow up noted. Follow up if symptoms worsen or fail to improve.   02/27/2022 [name] MD - Patient was seen for Vaginal discharge and additional concerns. Referral to OBGYN. Discontinued Percocet.   Recent consult visits: 05/28/22 Holland Commons, DPM (Podiatry) : Patient presented for nail trim. Follow up in 3 months.   04/09/22 Warden Fillers (ophthalmology): Patient presented for eye exam. Unable to access  notes.  04/01/2022 Lynnda Shields MD (OBGYN) - Patient was seen for vaginal discharge. No medication changes. No follow up noted.    02/14/2022 Boneta Lucks DPM - Patient was seen for Pain due to onychomycosis of toenails of both feet and an additional concern. No medication changes. Follow up in 3 months.  12/16/21 Sullivan Lone, MD (hem/oncology): Patient presented for anemia follow up.   Hospital visits: None in previous 6 months  Objective:  Lab Results  Component Value Date   CREATININE 1.19 (H) 12/16/2021   BUN 11 12/16/2021   GFR 48.80 (L) 10/25/2021   GFRNONAA 48 (L) 12/16/2021   GFRAA >60 11/16/2017   NA 138 12/16/2021   K 4.1 12/16/2021   CALCIUM 9.4 12/16/2021   CO2 29 12/16/2021   GLUCOSE 246 (H) 12/16/2021    Lab Results  Component Value Date/Time   HGBA1C 5.9 (A) 03/19/2022 10:46 AM   HGBA1C 6.5 10/14/2021 03:21 PM   HGBA1C 6.6 (H) 06/05/2021 12:22 PM   GFR 48.80 (L) 10/25/2021 12:36 PM   GFR 60.35 10/14/2021 03:21 PM   MICROALBUR <0.7 06/08/2019 12:55 PM   MICROALBUR 0.2 04/11/2014 11:36 AM    Last diabetic Eye exam:  Lab Results  Component Value Date/Time   HMDIABEYEEXA No Retinopathy 10/24/2021 11:25 AM    Last diabetic Foot exam:  Lab Results  Component Value Date/Time   HMDIABFOOTEX done 07/28/2013 12:00 AM     Lab Results  Component Value Date   CHOL 126 10/25/2021   HDL 54.30 10/25/2021   LDLCALC 57 10/25/2021   LDLDIRECT 95.8 07/02/2006   TRIG 76.0 10/25/2021   CHOLHDL 2 10/25/2021  Latest Ref Rng & Units 12/16/2021   12:55 PM 10/25/2021   12:36 PM 10/14/2021    3:21 PM  Hepatic Function  Total Protein 6.5 - 8.1 g/dL 6.9  7.4  7.4   Albumin 3.5 - 5.0 g/dL 3.4  4.1  3.9   AST 15 - 41 U/L 18  22  63   ALT 0 - 44 U/L _0 Alk Phosphatase 38 - 126 U/L 72  94  99   Total Bilirubin 0.3 - 1.2 mg/dL 1.2  1.3  0.8     Lab Results  Component Value Date/Time   TSH 0.76 06/05/2021 12:22 PM   TSH 1.32 04/11/2014 11:36 AM        Latest Ref Rng & Units 12/16/2021   12:55 PM 11/29/2021   10:02 AM 10/25/2021   12:36 PM  CBC  WBC 4.0 - 10.5 K/uL 4.7   4.6   Hemoglobin 12.0 - 15.0 g/dL 12.3  12.9  13.2   Hematocrit 36.0 - 46.0 % 35.9  38.0  39.2   Platelets 150 - 400 K/uL 127   121.0     Lab Results  Component Value Date/Time   VD25OH 86.43 06/05/2021 12:22 PM   VD25OH CANCELED 05/10/2020 02:01 PM    Clinical ASCVD: Yes  The ASCVD Risk score (Arnett DK, et al., 2019) failed to calculate for the following reasons:   The valid total cholesterol range is 130 to 320 mg/dL       10/01/2021    1:49 PM 03/18/2017    8:18 AM 12/19/2015   10:23 AM  Depression screen PHQ 2/9  Decreased Interest 0 0 0  Down, Depressed, Hopeless 0 0 0  PHQ - 2 Score 0 0 0      Social History   Tobacco Use  Smoking Status Some Days   Packs/day: 0.20   Years: 10.00   Total pack years: 2.00   Types: Cigarettes  Smokeless Tobacco Never   BP Readings from Last 3 Encounters:  04/01/22 135/77  03/19/22 122/62  02/27/22 120/60   Pulse Readings from Last 3 Encounters:  04/01/22 87  03/19/22 81  02/27/22 85   Wt Readings from Last 3 Encounters:  04/01/22 140 lb (63.5 kg)  03/19/22 140 lb (63.5 kg)  02/27/22 138 lb 12.8 oz (63 kg)   BMI Readings from Last 3 Encounters:  04/01/22 25.61 kg/m  03/19/22 25.61 kg/m  02/27/22 25.39 kg/m    Assessment/Interventions: Review of patient past medical history, allergies, medications, health status, including review of consultants reports, laboratory and other test data, was performed as part of comprehensive evaluation and provision of chronic care management services.   SDOH:  (Social Determinants of Health) assessments and interventions performed: Yes  SDOH Interventions    Flowsheet Row Chronic Care Management from 06/27/2022 in [place] at [place] Management from 02/27/2021 in [place] at [place] Interventions    Transportation  Interventions Intervention Not Indicated Intervention Not Indicated  Financial Strain Interventions -- Intervention Not Indicated       SDOH Screenings   Food Insecurity: No Food Insecurity (10/01/2021)  Transportation Needs: No Transportation Needs (06/27/2022)  Depression (PHQ2-9): Low Risk  (10/01/2021)  Financial Resource Strain: Medium Risk (10/01/2021)  Physical Activity: Inactive (10/01/2021)  Stress: No Stress Concern Present (10/01/2021)  Tobacco Use: High Risk (05/28/2022)    CCM Care Plan  Allergies  Allergen Reactions   Ace Inhibitors Cough    ????  Bee Venom Swelling   Chlorthalidone     REACTION: unspecified   Metformin     REACTION: gi side effects   Penicillins     REACTION: urticaria (hives)   Sulfamethoxazole     REACTION: questionable    Medications Reviewed Today     Reviewed by Viona Gilmore, [facility] (Pharmacist) on 06/27/22 at 1457  Med List Status: <None>   Medication Order Taking? Sig Documenting Provider Last Dose Status Informant  ACCU-CHEK GUIDE test strip [id number]  TEST UP TO 4 TIMES A DAY Billie Ruddy, MD  Active Self  atenolol (TENORMIN) 100 MG tablet [id number]  Take 1 tablet (100 mg total) by mouth 2 (two) times daily. Billie Ruddy, MD  Active   blood glucose meter kit and supplies KIT [id number]  Dispense based on patient and insurance preference. Use up to four times daily as directed. Billie Ruddy, MD  Active Self  Blood Glucose Monitoring Suppl [facility] Karsten Fells) w/Device Drucie Opitz [id number]  Test twice daily (onetouch ultra) Billie Ruddy, MD  Active Self  Blood Pressure Monitoring (BLOOD PRESSURE CUFF) MISC [id number]  Use daily to monitor blood pressue Billie Ruddy, MD  Active Self  ferrous gluconate (FERGON) 324 MG tablet [id number]  Take 324 mg by mouth daily with breakfast. [provider]  Active   glipiZIDE (GLUCOTROL XL) 10 MG 24 hr tablet [id number] Yes Take 1 tablet (10 mg total) by mouth 2 (two) times daily. Billie Ruddy, MD Taking Active   glucose blood [facility] ULTRA) test strip [id number]  Use as instructed for blood sugar testing 3 times daily. Billie Ruddy, MD  Active Self  glucose blood test strip [id number]  1 each by Other route 4 (four) times daily as needed for other. Use as instructed Billie Ruddy, MD  Active Self  Lancets [facility] ULTRASOFT) lancets [id number]  Use as instructed Billie Ruddy, MD  Active Self  Multiple Vitamin (MULTIVITAMIN) tablet [id number]  Take 1 tablet by mouth daily. [provider]  Active   naproxen sodium (ALEVE) 220 MG tablet [id number] Yes Take 220 mg by mouth daily as needed. [provider] Taking Active   pantoprazole (PROTONIX) 40 MG tablet [id number]  TAKE 1 TABLET BY MOUTH EVERY DAY Billie Ruddy, MD  Active   prednisoLONE acetate (PRED FORTE) 1 % ophthalmic suspension [id number]  SMARTSIG:In Eye(s) [provider]  Active   TRULICITY 1.5 FH/2.1FX SOPN [id number] Yes INJECT 1.5 MG INTO THE SKIN ONCE A WEEK.  Patient taking differently: Takes on Sat   Billie Ruddy, MD Taking Active Self            Patient Active Problem List   Diagnosis Date Noted   Vaginal discharge 04/01/2022   Central retinal artery occlusion of right eye 10/25/2021   Symptomatic anemia 06/06/2021   Hyperglycemia due to diabetes mellitus ([place]) 06/06/2021   Elevated brain natriuretic peptide (BNP) level 06/06/2021   Atherosclerosis of aorta ([place]) 05/18/2020   Cirrhosis of liver without ascites ([place]) 05/18/2020   Internal hemorrhoids 06/22/2019   Dysuria 02/20/2018   Anemia    Sepsis due to Escherichia coli (E. coli) (HCC)    AKI (acute kidney injury) ([place])    Sepsis ([place]) 09/19/2017   UTI (urinary tract infection) 09/19/2017   Chronic kidney disease (CKD), stage II (mild) 09/19/2017   Leukopenia 09/19/2017   Chronic diastolic CHF (congestive heart failure) ([place]) 09/19/2017   Statins contraindicated 06/12/2017   Hepatotoxicity due to  statin drug 06/12/2017   History of adenomatous polyp of colon 03/18/2017   Viral URI with cough 01/17/2017   Thrombocytopenia ([place]) 12/19/2015   Insomnia 08/21/2015   Nicotine use disorder 05/21/2015   Eczema 05/21/2015   Fatty liver disease, nonalcoholic 21/22/4825   Hyperlipidemia 11/03/2007   History of CVA (cerebrovascular accident) 10/20/2007   Type II diabetes mellitus with renal manifestations ([place]) 12/10/2006   Essential hypertension 12/10/2006   History of breast cancer 12/10/2006    Immunization History  Administered Date(s) Administered   Fluad Quad(high Dose 65+) 07/05/2020   Influenza Split 04/30/2012   Influenza Whole 06/01/2007, 04/27/2008, 06/05/2009, 05/02/2010, 04/28/2011   Influenza, High Dose Seasonal PF 03/14/2015, 04/04/2016, 04/30/2017, 04/30/2018, 05/04/2019   Influenza,inj,Quad PF,6+ Mos 03/23/2013   Influenza-Unspecified 04/11/2014   PFIZER(Purple Top)SARS-COV-2 Vaccination 09/25/2019, 10/25/2019, 05/12/2020, 03/11/2021   Pneumococcal Conjugate-13 06/30/2014   Pneumococcal Polysaccharide-23 03/21/2009, 08/21/2015   Tdap 09/22/2011   Zoster, Live 03/19/2011   She is out of her glipizide tonight and will try to pick up today from the pharmacy. Her blood sugars are 140-145 mostly but has had some episodes of lows and has had EMS called on her as well to treat those lows.  Patient [facility] but called to confirm with them and they have not given her a flu shot. Will let PCP know that she is due at upcoming appt.  Conditions to be addressed/monitored:  Hypertension, Hyperlipidemia, Diabetes, Heart Failure, GERD, and Chronic Kidney Disease  Conditions addressed this visit: Diabetes, hypertension  Care Plan : CCM Pharmacy Care Plan  Updates made by Viona Gilmore, [place] since 06/27/2022 12:00 AM     Problem: Problem: Hypertension, Hyperlipidemia, Diabetes, Heart Failure, GERD, and Chronic Kidney Disease      Long-Range Goal:  Patient-Specific Goal   Start Date: 02/27/2021  Expected End Date: 02/27/2022  Recent Progress: On track  Priority: High  Note:   Current Barriers:  Unable to independently monitor therapeutic efficacy  Pharmacist Clinical Goal(s):  Patient will achieve adherence to monitoring guidelines and medication adherence to achieve therapeutic efficacy through collaboration with PharmD and provider.   Interventions: 1:1 collaboration with Billie Ruddy, MD regarding development and update of comprehensive plan of care as evidenced by provider attestation and co-signature Inter-disciplinary care team collaboration (see longitudinal plan of care) Comprehensive medication review performed; medication list updated in electronic medical record  Hypertension (BP goal <130/80) -Controlled -Current treatment: Atenolol 100 mg 1 tablet twice daily - Appropriate, Effective, Safe, Accessible Furosemide 20 mg as needed - Appropriate, Effective, Safe, Accessible -Medications previously tried: chlorthalidone, ACE inhibitor  -Current home readings: 112/64 (checking at the drug store once a week) -Current dietary habits: limits salt intake -Current exercise habits: no structured exercise -Denies hypotensive/hypertensive symptoms -Educated on BP goals and benefits of medications for prevention of heart attack, stroke and kidney damage; Importance of home blood pressure monitoring; Proper BP monitoring technique; -Counseled to monitor BP at home weekly, document, and provide log at future appointments -Counseled on diet and exercise extensively Recommended to continue current medication  Hyperlipidemia: (LDL goal < 70) -Controlled -Current treatment: No medications -Medications previously tried: statins (elevated LFTs)  -Current dietary patterns: did not discuss -Current exercise habits: no formal exercise -Educated on Cholesterol goals;  Exercise goal of 150 minutes per week; -Counseled on diet and  exercise extensively Recommended repeat lipid panel  Diabetes (A1c goal <7%) -Controlled -Current medications: Trulicity 1.5 inject 1.5 mg once a week - Appropriate,  Effective, Safe, Accessible Glipizide XL 10 mg 1 tablet twice a daily - Appropriate, Effective, Safe, Accessible -Medications previously tried: metformin (diarrhea)  -Current home glucose readings fasting glucose: 140-145 (highest she has seen recently) post prandial glucose: not much  -Denies hypoglycemic/hyperglycemic symptoms -Current meal patterns:  breakfast: grapefruits with oatmeal, dried raisins and cranberries ([employer]), sausage lunch: salad, chicken salad with crackers  dinner: pasta and shrimp or salmon with vegetable snacks: ritz crackers or potato chips; corn chips; donut; sugarless drinks drinks: water; diet soda -Current exercise: no structured exercise -Educated on A1c and blood sugar goals; Complications of diabetes including kidney damage, retinal damage, and cardiovascular disease; Exercise goal of 150 minutes per week; Carbohydrate counting and/or plate method -Counseled to check feet daily and get yearly eye exams -Counseled on diet and exercise extensively Recommended to continue current medication Recommended CGM monitoring and submitted an order.  Heart Failure (Goal: manage symptoms and prevent exacerbations) -Not ideally controlled -Last ejection fraction: 60-65% (Date: 08/11/2017) -HF type: Diastolic -NYHA Class: II (slight limitation of activity) -AHA HF Stage: B (Heart disease present - no symptoms present) -Current treatment: Furosemide 20 mg 1 tablet every other day as needed - Appropriate, Effective, Safe, Accessible Potassium chloride 20 mEq tablet daily - Appropriate, Effective, Safe, Accessible -Medications previously tried: none  -Current home BP/HR readings: refer to above -Current dietary habits: limits salt intake -Current exercise habits: minimal -Educated on Importance  of weighing daily; if you gain more than 3 pounds in one day or 5 pounds in one week, call PCP Proper diuretic administration and potassium supplementation -Counseled on diet and exercise extensively Recommended to continue current medication  GERD (Goal: minimize symptoms) -Controlled -Current treatment  Pantoprazole 40 mg daily - Appropriate, Effective, Safe, Accessible -Medications previously tried: none  -Recommended to continue current medication  Neuropathy (Goal: minimize nerve pain/tingling) -Controlled -Current treatment  None -Medications previously tried: gabapentin -Recommended to continue current medication  History of CVA (Goal: prevent future strokes) -Controlled -Current treatment  No medications -Medications previously tried: none  -Recommended to continue as is.   Health Maintenance -Vaccine gaps: shingrix, tetanus, COVID booster -Current therapy:  Multivitamin 1 tablet daily Ferrous gluconate 325 mg 1 tablet daily -Educated on Cost vs benefit of each product must be carefully weighed by individual consumer -Patient is satisfied with current therapy and denies issues -Recommended to continue as is  Patient Goals/Self-Care Activities Patient will:  - take medications as prescribed check glucose daily, document, and provide at future appointments check blood pressure weekly, document, and provide at future appointments target a minimum of 150 minutes of moderate intensity exercise weekly  Follow Up Plan: The care management team will reach out to the patient again over the next 14 days.         Medication Assistance: None required.  Patient affirms current coverage meets needs.  Compliance/Adherence/Medication fill history: Care Gaps: Shingrix, tetanus, COVID booster, urine microalbumin, [facility])  Last BP - 135/77 on 04/01/2022 Last A1C - 5.9 on 03/19/2022  Star-Rating Drugs: Glipizide 10 mg - last [facility] Trulicity  1.5 SM/2.7 ml - PAP  Patient[facility] pharmacy [facility]  [id number] [place] [place] [place]Phone: [phone]: [address] [address]Phone: [phone]: [phone]  Uses pill  box? Yes - weekly pill box Pt endorses 99% compliance - once in the last few months  We discussed: Current pharmacy is preferred with insurance plan and patient is satisfied with pharmacy services Patient decided to: Continue current medication management strategy  Care Plan and Follow Up Patient Decision:  Patient agrees to Care Plan and Follow-up.  Plan: The care management team will reach out to the patient again over the next 14 days.  Jeni Salles, PharmD, [place] Pharmacist [place] [facility] [place]

## 2022-07-05 ENCOUNTER — Other Ambulatory Visit: Payer: Self-pay | Admitting: Family Medicine

## 2022-07-07 ENCOUNTER — Ambulatory Visit (INDEPENDENT_AMBULATORY_CARE_PROVIDER_SITE_OTHER): Payer: Medicare HMO | Admitting: Family Medicine

## 2022-07-07 ENCOUNTER — Other Ambulatory Visit: Payer: Self-pay | Admitting: Family Medicine

## 2022-07-07 VITALS — BP 126/60 | HR 82 | Temp 98.6°F | Wt 142.2 lb

## 2022-07-07 DIAGNOSIS — E162 Hypoglycemia, unspecified: Secondary | ICD-10-CM

## 2022-07-07 DIAGNOSIS — R0602 Shortness of breath: Secondary | ICD-10-CM

## 2022-07-07 DIAGNOSIS — E1129 Type 2 diabetes mellitus with other diabetic kidney complication: Secondary | ICD-10-CM | POA: Diagnosis not present

## 2022-07-07 DIAGNOSIS — R29898 Other symptoms and signs involving the musculoskeletal system: Secondary | ICD-10-CM

## 2022-07-07 DIAGNOSIS — I5032 Chronic diastolic (congestive) heart failure: Secondary | ICD-10-CM

## 2022-07-07 DIAGNOSIS — L84 Corns and callosities: Secondary | ICD-10-CM

## 2022-07-07 LAB — POCT GLYCOSYLATED HEMOGLOBIN (HGB A1C): Hemoglobin A1C: 5.7 % — AB (ref 4.0–5.6)

## 2022-07-07 MED ORDER — DEXCOM G7 SENSOR MISC: 1.0000 | 11 refills | Status: DC

## 2022-07-07 MED ORDER — DEXCOM G7 RECEIVER DEVI: 1.0000 | 0 refills | Status: DC

## 2022-07-07 MED ORDER — ONETOUCH ULTRA VI STRP: ORAL_STRIP | 11 refills | Status: DC

## 2022-07-07 MED ORDER — ONETOUCH ULTRASOFT LANCETS MISC: 11 refills | Status: DC

## 2022-07-07 NOTE — Progress Notes (Signed)
Subjective:    Patient ID: Anita Mccormick, female    DOB: 1946-04-11, 76 y.o.   MRN: [id number]  Chief Complaint  Patient presents with   Follow-up   Cough    Pt reports coughing started couple days ago before her flu, covid  vaccine last Friday. Cough more loosen- thick clear phlegms. Mostly in the evening. Thinks it is due to her allergy.    Diabetes    HPI Patient is a 76 yo female with pmh sig for CKD 2, diastolic CHF, leukopenia, aortic atherosclerosis, central retinal artery occlusion of right eye, HTN, cirrhosis, statin intolerance, DM 2, eczema who was seen today for f/u.  Needs refills on One Touch test strips and ultrasoft lancets.  Has not really been taking checking bs as does not like pricking herself.  Called EMS x 2 in the last 6 months 2/2 hypoglycemia.  Blood sugar 38 and 48.  Recalls eating a light for dinner prior to hypoglycemic episodes.  Taking glipizide XL 10 mg, Trulicity 1.5 mg weekly.  Highest around 140.  Patient endorses shortness of breath on exertion.  Gets tired when walking to the mailbox.  Endorses leg swelling at night then resolving in the morning.  States legs feel like they give out.  Endorses a few falls.  Also notes pain in feet due to callus on side of toe.  [facility] Modern[facility] .  Past Medical History:  Diagnosis Date   Arthritis    Cancer ([place])    breast left   Cerebrovascular accident [facility])    October 29 2021- Left eye- blind   COLONIC POLYPS, HX OF 12/10/2006   Qualifier: Diagnosis of  By: Leanne Chang MD, [place]     Diabetes mellitus type II, controlled ([place]) 12/10/2006   Poor control on Januvia '100mg'$  and glipizide '10mg'$  XL Lab Results  Component Value Date   HGBA1C 8.3* 11/02/2014        Eczema    Fatty liver disease, nonalcoholic 15/72/6203   Per Dr. Leanne Chang Lab Results  Component Value Date   ALT 31 11/02/2014   AST 57* 11/02/2014   ALKPHOS 104 11/02/2014   BILITOT 1.1 11/02/2014       GERD  (gastroesophageal reflux disease)    History of blood transfusion    History of kidney stones 2023   11/28/21 small stone currently, taking flomax   Hypertension     Allergies  Allergen Reactions   Ace Inhibitors Cough    ????   Bee Venom Swelling   Chlorthalidone     REACTION: unspecified   Metformin     REACTION: gi side effects   Penicillins     REACTION: urticaria (hives)   Sulfamethoxazole     REACTION: questionable    ROS General: Denies fever, chills, night sweats, changes in weight, changes in appetite + falls HEENT: Denies headaches, ear pain, changes in vision, rhinorrhea, sore throat CV: Denies CP, palpitations, orthopnea  + SOB, DOE, LE edema Pulm: Denies cough, wheezing + SOB GI: Denies abdominal pain, nausea, vomiting, diarrhea, constipation GU: Denies dysuria, hematuria, frequency, vaginal discharge Msk: Denies muscle cramps, joint pains + LE weakness Neuro: Denies weakness, numbness, tingling Skin: Denies rashes, bruising Psych: Denies depression, anxiety, hallucinations     Objective:    Blood pressure 126/60, pulse 82, temperature 98.6 F (37 C), temperature source Oral, weight 142 lb 3.2 oz (64.5 kg), SpO2 99 %.  Gen. Pleasant, well-nourished, in no  distress, normal affect   HEENT: [place]/AT, face symmetric, conjunctiva clear, no scleral icterus, PERRLA, EOMI, nares patent without drainage Lungs: no accessory muscle use, CTAB, no wheezes or rales Cardiovascular: RRR, no m/r/g, no peripheral edema Musculoskeletal: No deformities, no cyanosis or clubbing, normal tone Neuro:  A&Ox3, CN II-XII intact, normal gait Skin:  Warm, no lesions/ rash   Wt Readings from Last 3 Encounters:  07/07/22 142 lb 3.2 oz (64.5 kg)  04/01/22 140 lb (63.5 kg)  03/19/22 140 lb (63.5 kg)    Lab Results  Component Value Date   WBC 4.7 12/16/2021   HGB 12.3 12/16/2021   HCT 35.9 (L) 12/16/2021   PLT 127 (L) 12/16/2021   GLUCOSE 246 (H) 12/16/2021   CHOL 126 10/25/2021    TRIG 76.0 10/25/2021   HDL 54.30 10/25/2021   LDLDIRECT 95.8 07/02/2006   LDLCALC 57 10/25/2021   ALT 11 12/16/2021   AST 18 12/16/2021   NA 138 12/16/2021   K 4.1 12/16/2021   CL 105 12/16/2021   CREATININE 1.19 (H) 12/16/2021   BUN 11 12/16/2021   CO2 29 12/16/2021   TSH 0.76 06/05/2021   INR 1.1 10/25/2021   HGBA1C 5.7 (A) 07/07/2022   MICROALBUR <0.7 06/08/2019    Assessment/Plan:  Type 2 diabetes mellitus with other diabetic kidney complication, without long-term current use of insulin ([place]) -Controlled. -Given episodes of hypoglycemia will d/c glipizide XL 10 mg daily. -Continue Trulicity 1.5 mg weekly - Plan: POC HgB A1c, glucose blood (ONETOUCH ULTRA) test strip, Lancets (ONETOUCH ULTRASOFT) lancets, Continuous Blood Gluc Receiver (DEXCOM G7 RECEIVER) DEVI, Continuous Blood Gluc Sensor (DEXCOM G7 SENSOR) MISC  Hypoglycemia -Hemoglobin A1c 5.7% this visit -Will discontinue glipizide XL 10 mg daily.  Patient to monitor blood sugar closely.  If needed restart glipizide XL at 5 mg daily. - Plan: glucose blood (ONETOUCH ULTRA) test strip, Lancets (ONETOUCH ULTRASOFT) lancets, Continuous Blood Gluc Receiver (DEXCOM G7 RECEIVER) DEVI, Continuous Blood Gluc Sensor (DEXCOM G7 SENSOR) MISC  Weakness of both lower extremities  - Plan: Ambulatory referral to Vascular Surgery  SOB (shortness of breath) on exertion  Chronic diastolic CHF (congestive heart failure) ([place]) -Echo on 06/07/2021 with mild LVH and grade 1 diastolic dysfunction, EF 20-94%.  Possible PFO -Discussed importance of lifestyle modifications -Continue atenolol -Continue follow-up with cardiology  Foot callus -Follow-up with podiatry  F/u 6-8 weeks, sooner if needed  [name], MD

## 2022-07-09 DIAGNOSIS — E119 Type 2 diabetes mellitus without complications: Secondary | ICD-10-CM | POA: Diagnosis not present

## 2022-07-09 DIAGNOSIS — Z961 Presence of intraocular lens: Secondary | ICD-10-CM | POA: Diagnosis not present

## 2022-07-09 DIAGNOSIS — H3412 Central retinal artery occlusion, left eye: Secondary | ICD-10-CM | POA: Diagnosis not present

## 2022-07-09 DIAGNOSIS — H18513 Endothelial corneal dystrophy, bilateral: Secondary | ICD-10-CM | POA: Diagnosis not present

## 2022-07-24 ENCOUNTER — Telehealth: Payer: Self-pay | Admitting: Pharmacist

## 2022-07-24 NOTE — Chronic Care Management (AMB) (Signed)
    Chronic Care Management Pharmacy Assistant   Name: Anita Mccormick  MRN: [id number] DOB: 08-24-1945  Reason for Encounter: Follow up Dexcom  Spoke with patient on 07/15/22, she stated she had not received the Dexcom, she states [employer] had contacted her and they were needing some forms to be completed, they would be mailing them to her.  I spoke with patient today 07/24/22, she states she has not received the forms from [facility], she states she plans to call them today to follow up with them. She does have their phone # [phone]).   [place] Pharmacist Assistant [phone]

## 2022-07-27 DIAGNOSIS — I10 Essential (primary) hypertension: Secondary | ICD-10-CM

## 2022-07-27 DIAGNOSIS — E1129 Type 2 diabetes mellitus with other diabetic kidney complication: Secondary | ICD-10-CM

## 2022-07-30 ENCOUNTER — Other Ambulatory Visit: Payer: Self-pay | Admitting: *Deleted

## 2022-07-30 ENCOUNTER — Encounter: Payer: Self-pay | Admitting: Family Medicine

## 2022-07-30 DIAGNOSIS — I700 Atherosclerosis of aorta: Secondary | ICD-10-CM

## 2022-07-30 DIAGNOSIS — I6522 Occlusion and stenosis of left carotid artery: Secondary | ICD-10-CM

## 2022-08-01 ENCOUNTER — Ambulatory Visit (INDEPENDENT_AMBULATORY_CARE_PROVIDER_SITE_OTHER): Payer: Medicare HMO | Admitting: Family Medicine

## 2022-08-01 ENCOUNTER — Ambulatory Visit (INDEPENDENT_AMBULATORY_CARE_PROVIDER_SITE_OTHER): Payer: Medicare HMO

## 2022-08-01 VITALS — BP 142/56 | HR 80 | Temp 98.7°F | Wt 143.6 lb

## 2022-08-01 DIAGNOSIS — I5032 Chronic diastolic (congestive) heart failure: Secondary | ICD-10-CM | POA: Diagnosis not present

## 2022-08-01 DIAGNOSIS — R052 Subacute cough: Secondary | ICD-10-CM | POA: Diagnosis not present

## 2022-08-01 DIAGNOSIS — I10 Essential (primary) hypertension: Secondary | ICD-10-CM

## 2022-08-01 DIAGNOSIS — R059 Cough, unspecified: Secondary | ICD-10-CM | POA: Diagnosis not present

## 2022-08-01 MED ORDER — FUROSEMIDE 20 MG PO TABS: 20.0000 mg | ORAL_TABLET | Freq: Every day | ORAL | 0 refills | Status: DC

## 2022-08-01 MED ORDER — AZITHROMYCIN 250 MG PO TABS: ORAL_TABLET | ORAL | 0 refills | Status: AC

## 2022-08-01 NOTE — Progress Notes (Signed)
   Acute Office Visit  Subjective:     Patient ID: Anita Mccormick, female    DOB: 1945-12-13, 77 y.o.   MRN: [id number]  Chief Complaint  Patient presents with   Follow-up    On cough. Pt reports her cough get worse at night with sob.     HPI Patient is in today for follow-up on ongoing concerns.  Patient endorses continued cough since late December.  Symptoms worse late at night causing SOB due to coughing spells.  Cough is productive with clear sputum.  Taking Safe Tussin.  Denies fever, chills, nausea, vomiting, rhinorrhea.  ROS Positive review of symptoms as stated above.     Objective:    BP (!) 142/56 (BP Location: Right Arm, Patient Position: Sitting, Cuff Size: Normal)   Pulse 80   Temp 98.7 F (37.1 C) (Oral)   Wt 143 lb 9.6 oz (65.1 kg)   SpO2 97%   BMI 26.26 kg/m    Physical Exam Constitutional:      Appearance: Normal appearance.  HENT:     Head: Atraumatic.     Right Ear: Tympanic membrane normal.     Left Ear: Tympanic membrane normal.     Nose: Nose normal. No rhinorrhea.  Cardiovascular:     Rate and Rhythm: Normal rate and regular rhythm.     Heart sounds: Normal heart sounds.     Comments: Trace edema in b/l Les. Pulmonary:     Breath sounds: Normal breath sounds.     Comments: Cough, transmitted upper air way noises.   Skin:    General: Skin is warm and dry.  Neurological:     Mental Status: She is alert.     No results found for any visits on 08/01/22.      Assessment & Plan:   Problem List Items Addressed This Visit       Cardiovascular and Mediastinum   Chronic diastolic CHF (congestive heart failure) (HCC)   Relevant Medications   furosemide (LASIX) 20 MG tablet   Essential hypertension   Relevant Medications   furosemide (LASIX) 20 MG tablet   Other Visit Diagnoses     Subacute cough    -  Primary   Relevant Medications   furosemide (LASIX) 20 MG tablet   azithromycin (ZITHROMAX) 250 MG tablet   Other Relevant Orders    DG Chest 2 View       Meds ordered this encounter  Medications   furosemide (LASIX) 20 MG tablet    Sig: Take 1 tablet (20 mg total) by mouth daily for 3 days.    Dispense:  3 tablet    Refill:  0   azithromycin (ZITHROMAX) 250 MG tablet    Sig: Take 2 tablets on day 1, then 1 tablet daily on days 2 through 5    Dispense:  6 tablet    Refill:  0   Given continued symptoms concern for CHF exacerbation versus pna or other infectious process.  Will obtain CXR.  Given the weekend approaching Rx for azithromycin and a few days of Lasix sent to pharmacy.  Further recommendations based on imaging results.  Given strict precautions.  Return if symptoms worsen or fail to improve.  Billie Ruddy, MD

## 2022-08-07 ENCOUNTER — Ambulatory Visit (INDEPENDENT_AMBULATORY_CARE_PROVIDER_SITE_OTHER)
Admission: RE | Admit: 2022-08-07 | Discharge: 2022-08-07 | Disposition: A | Discharge status: Home / Self Care | Payer: Medicare HMO | Source: Ambulatory Visit | Attending: Vascular Surgery | Admitting: Vascular Surgery

## 2022-08-07 ENCOUNTER — Encounter: Payer: Self-pay | Admitting: Vascular Surgery

## 2022-08-07 ENCOUNTER — Ambulatory Visit (HOSPITAL_COMMUNITY)
Admission: RE | Admit: 2022-08-07 | Discharge: 2022-08-07 | Disposition: A | Discharge status: Home / Self Care | Payer: Medicare HMO | Source: Ambulatory Visit | Attending: Vascular Surgery | Admitting: Vascular Surgery

## 2022-08-07 ENCOUNTER — Ambulatory Visit: Payer: Medicare HMO | Admitting: Vascular Surgery

## 2022-08-07 VITALS — BP 145/80 | HR 80 | Temp 97.9°F | Resp 20 | Ht 62.0 in | Wt 149.0 lb

## 2022-08-07 DIAGNOSIS — I6522 Occlusion and stenosis of left carotid artery: Secondary | ICD-10-CM | POA: Diagnosis not present

## 2022-08-07 DIAGNOSIS — I700 Atherosclerosis of aorta: Secondary | ICD-10-CM | POA: Insufficient documentation

## 2022-08-07 DIAGNOSIS — I70219 Atherosclerosis of native arteries of extremities with intermittent claudication, unspecified extremity: Secondary | ICD-10-CM | POA: Diagnosis not present

## 2022-08-07 LAB — VAS US ABI WITH/WO TBI

## 2022-08-07 NOTE — Progress Notes (Signed)
REASON FOR VISIT:    Lower extremity weakness.  The consult is requested by Dr. [name].  MEDICAL ISSUES:   PERIPHERAL ARTERIAL DISEASE: This patient describes some weakness in her legs but I do not get any history of claudication or rest pain.  She has biphasic Doppler signals in both feet on my exam.  I do not think her leg weakness can be attributed to peripheral arterial disease.  Her activity level seems to be limited mostly by her dyspnea on exertion.  She is not a smoker.  I encouraged her to stay as active as possible.  We will be happy to see her back at any time if she develops claudication rest pain or nonhealing ulcer.  However currently I think her circulation is good in both lower extremities.  HISTORY OF LEFT CAROTID STENOSIS: Based on a previous CT of the neck the patient had a moderate 30 to 40% left carotid stenosis.  Carotid duplex scan today however shows a less than 39% stenosis bilaterally.  She is asymptomatic.  I do not think that routine follow-up duplex scans are needed unless she develops any new neurologic symptoms.   HPI:   Anita Mccormick is a pleasant 77 y.o. female who I previously seen for a temporal artery biopsy and also with a mild left carotid stenosis that was seen on CT scan.  Of note, her temporal artery biopsy was negative.  She was referred because of leg weakness.  On my history she describes weakness in both legs and also significant dyspnea on exertion.  She does not have any pain or suggestion of claudication although again I think her activity is limited by her pulmonary status.  She does describe some swelling in her legs.  I do not get any history of rest pain or nonhealing wounds.  She denies any history of stroke, TIAs, expressive or receptive aphasia, or amaurosis fugax.  Past Medical History:  Diagnosis Date   Arthritis    Cancer ([place])    breast left   Cerebrovascular accident [facility])    October 29 2021- Left eye- blind   COLONIC  POLYPS, HX OF 12/10/2006   Qualifier: Diagnosis of  By: Leanne Chang MD, Bruce     Diabetes mellitus type II, controlled ([place]) 12/10/2006   Poor control on Januvia '100mg'$  and glipizide '10mg'$  XL Lab Results  Component Value Date   HGBA1C 8.3* 11/02/2014        Eczema    Fatty liver disease, nonalcoholic 97/98/9211   Per Dr. Leanne Chang Lab Results  Component Value Date   ALT 31 11/02/2014   AST 57* 11/02/2014   ALKPHOS 104 11/02/2014   BILITOT 1.1 11/02/2014       GERD (gastroesophageal reflux disease)    History of blood transfusion    History of kidney stones 2023   11/28/21 small stone currently, taking flomax   Hypertension     Family History  Problem Relation Age of Onset   Stroke Mother    Cancer Father        prostate   Diabetes Sister    Pancreatic cancer Sister     SOCIAL HISTORY: Social History   Tobacco Use   Smoking status: Some Days    Packs/day: 0.20    Years: 10.00    Total pack years: 2.00    Types: Cigarettes   Smokeless tobacco: Never  Substance Use Topics   Alcohol use: Not Currently    Allergies  Allergen Reactions  Ace Inhibitors Cough    ????   Bee Venom Swelling   Chlorthalidone     REACTION: unspecified   Metformin     REACTION: gi side effects   Penicillins     REACTION: urticaria (hives)   Sulfamethoxazole     REACTION: questionable    Current Outpatient Medications  Medication Sig Dispense Refill   atenolol (TENORMIN) 100 MG tablet Take 1 tablet (100 mg total) by mouth 2 (two) times daily. 180 tablet 1   blood glucose meter kit and supplies KIT Dispense based on patient and insurance preference. Use up to four times daily as directed. 1 each 0   Blood Glucose Monitoring Suppl (ONETOUCH ULTRALINK) w/Device KIT Test twice daily (onetouch ultra) 1 each 0   Blood Pressure Monitoring (BLOOD PRESSURE CUFF) MISC Use daily to monitor blood pressue 1 each 0   Continuous Blood Gluc Receiver (DEXCOM G7 RECEIVER) DEVI 1 Device by Does not apply route as  directed. 1 each 0   Continuous Blood Gluc Sensor (DEXCOM G7 SENSOR) MISC 1 Device by Does not apply route every 21 ( twenty-one) days. 2 each 11   Dulaglutide (TRULICITY) 1.5 YC/1.4GY SOPN INJECT 1.5 MG (0.5ML) UNDER THE SKIN ONCE A WEEK 2 mL 3   ferrous gluconate (FERGON) 324 MG tablet Take 324 mg by mouth daily with breakfast.     glipiZIDE (GLUCOTROL XL) 10 MG 24 hr tablet Take 1 tablet (10 mg total) by mouth 2 (two) times daily. 180 tablet 1   glucose blood (ONETOUCH ULTRA) test strip Use as instructed for blood sugar testing 3 times daily. 100 each 11   Lancets (ONETOUCH ULTRASOFT) lancets Use as instructed 100 each 11   Multiple Vitamin (MULTIVITAMIN) tablet Take 1 tablet by mouth daily.     naproxen sodium (ALEVE) 220 MG tablet Take 220 mg by mouth daily as needed.     pantoprazole (PROTONIX) 40 MG tablet TAKE 1 TABLET BY MOUTH EVERY DAY 90 tablet 0   prednisoLONE acetate (PRED FORTE) 1 % ophthalmic suspension      furosemide (LASIX) 20 MG tablet Take 1 tablet (20 mg total) by mouth daily for 3 days. 3 tablet 0   No current facility-administered medications for this visit.    REVIEW OF SYSTEMS:  '[X]'$  denotes positive finding, '[ ]'$  denotes negative finding Cardiac  Comments:  Chest pain or chest pressure:    Shortness of breath upon exertion: x   Short of breath when lying flat:    Irregular heart rhythm:        Vascular    Pain in calf, thigh, or hip brought on by ambulation:    Pain in feet at night that wakes you up from your sleep:     Blood clot in your veins:    Leg swelling:  x       Pulmonary    Oxygen at home:    Productive cough:     Wheezing:         Neurologic    Sudden weakness in arms or legs:     Sudden numbness in arms or legs:     Sudden onset of difficulty speaking or slurred speech:    Temporary loss of vision in one eye:     Problems with dizziness:         Gastrointestinal    Blood in stool:     Vomited blood:         Genitourinary    Burning  when  urinating:     Blood in urine:        Psychiatric    Major depression:         Hematologic    Bleeding problems:    Problems with blood clotting too easily:        Skin    Rashes or ulcers:        Constitutional    Fever or chills:     PHYSICAL EXAM:   Vitals:   08/07/22 1443  BP: (!) 145/80  Pulse: 80  Resp: 20  Temp: 97.9 F (36.6 C)  SpO2: 96%  Weight: 149 lb (67.6 kg)  Height: '5\' 2"'$  (1.575 m)    GENERAL: The patient is a well-nourished female, in no acute distress. The vital signs are documented above. CARDIAC: There is a regular rate and rhythm.  VASCULAR: I do not detect carotid bruits. She has palpable femoral pulses. When I listen with the Doppler she has biphasic posterior tibial signals bilaterally.  She has a biphasic dorsalis pedis signal on the left and a monophasic dorsalis pedis signal on the right. PULMONARY: There is good air exchange bilaterally without wheezing or rales. ABDOMEN: Soft and non-tender with normal pitched bowel sounds.  MUSCULOSKELETAL: There are no major deformities or cyanosis. NEUROLOGIC: No focal weakness or paresthesias are detected. SKIN: There are no ulcers or rashes noted. PSYCHIATRIC: The patient has a normal affect.  DATA:    ARTERIAL DOPPLER STUDY: I have independently interpreted her arterial Doppler study today.  On the right side there is a monophasic posterior tibial and dorsalis pedis signals.  The arteries are calcified and ABI cannot accurately be obtained.  Toe pressures 21 mmHg.  On the left side there is a monophasic posterior tibial signal with a triphasic dorsalis pedis signal.  ABIs 86%.  Toe pressures 36 mmHg.  CAROTID DUPLEX: I have independently interpreted her carotid duplex scan today.  On the right side there is no significant stenosis.  The right vertebral artery is patent with antegrade flow.  On the left side, there is no significant stenosis.  The left vertebral artery is patent with  antegrade flow.   Deitra Mayo [facility] [phone]

## 2022-08-19 ENCOUNTER — Encounter: Payer: Self-pay | Admitting: Family Medicine

## 2022-09-19 DIAGNOSIS — C50912 Malignant neoplasm of unspecified site of left female breast: Secondary | ICD-10-CM | POA: Diagnosis not present

## 2022-09-22 ENCOUNTER — Other Ambulatory Visit: Payer: Self-pay | Admitting: Family Medicine

## 2022-09-22 ENCOUNTER — Encounter (INDEPENDENT_AMBULATORY_CARE_PROVIDER_SITE_OTHER): Payer: Medicare HMO | Admitting: Ophthalmology

## 2022-09-22 DIAGNOSIS — H35033 Hypertensive retinopathy, bilateral: Secondary | ICD-10-CM

## 2022-09-22 DIAGNOSIS — I10 Essential (primary) hypertension: Secondary | ICD-10-CM

## 2022-09-22 DIAGNOSIS — H3412 Central retinal artery occlusion, left eye: Secondary | ICD-10-CM

## 2022-09-22 DIAGNOSIS — H43813 Vitreous degeneration, bilateral: Secondary | ICD-10-CM

## 2022-09-24 ENCOUNTER — Encounter: Payer: Self-pay | Admitting: Podiatry

## 2022-09-24 ENCOUNTER — Ambulatory Visit: Payer: Medicare HMO | Admitting: Podiatry

## 2022-09-24 VITALS — BP 155/67

## 2022-09-24 DIAGNOSIS — M2141 Flat foot [pes planus] (acquired), right foot: Secondary | ICD-10-CM | POA: Diagnosis not present

## 2022-09-24 DIAGNOSIS — E119 Type 2 diabetes mellitus without complications: Secondary | ICD-10-CM

## 2022-09-24 DIAGNOSIS — M79674 Pain in right toe(s): Secondary | ICD-10-CM | POA: Diagnosis not present

## 2022-09-24 DIAGNOSIS — M2142 Flat foot [pes planus] (acquired), left foot: Secondary | ICD-10-CM | POA: Diagnosis not present

## 2022-09-24 DIAGNOSIS — B351 Tinea unguium: Secondary | ICD-10-CM | POA: Diagnosis not present

## 2022-09-24 DIAGNOSIS — E1149 Type 2 diabetes mellitus with other diabetic neurological complication: Secondary | ICD-10-CM

## 2022-09-24 DIAGNOSIS — M79675 Pain in left toe(s): Secondary | ICD-10-CM | POA: Diagnosis not present

## 2022-09-24 DIAGNOSIS — L84 Corns and callosities: Secondary | ICD-10-CM | POA: Diagnosis not present

## 2022-09-24 NOTE — Progress Notes (Unsigned)
ANNUAL DIABETIC FOOT EXAM  Subjective: Anita Mccormick presents today {jgcomplaint:23593}.  Chief Complaint  Patient presents with   Nail Problem    [facility] BS-did not check today A1C-7.0 PCP-Banks, [place] PCP VST-"2 weeks ago"    Patient confirms h/o diabetes.  Patient relates {Numbers; 0-100:15068} year h/o diabetes.  Patient denies any h/o foot wounds.  Patient has h/o foot ulcer of {jgPodToeLocator:23637}, which healed via help of ***.  Patient has h/o amputation(s): {jgamp:23617}.  Patient endorses symptoms of foot numbness.   Patient endorses symptoms of foot tingling.  Patient endorses symptoms of burning in feet.  Patient endorses symptoms of pins/needles sensation in feet.  Patient denies any numbness, tingling, burning, or pins/needle sensation in feet.  Patient has been diagnosed with neuropathy and it is managed with {JGNEUROPATHYMEDS:27053}.  Risk factors: {jgriskfactors:24044}.  Billie Ruddy, MD is patient's PCP. Last visit was {Time; dates multiple:15870}***.  Past Medical History:  Diagnosis Date   Arthritis    Cancer ([place])    breast left   Cerebrovascular accident [facility])    October 29 2021- Left eye- blind   COLONIC POLYPS, HX OF 12/10/2006   Qualifier: Diagnosis of  By: Leanne Chang MD, Bruce     Diabetes mellitus type II, controlled ([place]) 12/10/2006   Poor control on Januvia '100mg'$  and glipizide '10mg'$  XL Lab Results  Component Value Date   HGBA1C 8.3* 11/02/2014        Eczema    Fatty liver disease, nonalcoholic 123456   Per Dr. Leanne Chang Lab Results  Component Value Date   ALT 31 11/02/2014   AST 57* 11/02/2014   ALKPHOS 104 11/02/2014   BILITOT 1.1 11/02/2014       GERD (gastroesophageal reflux disease)    History of blood transfusion    History of kidney stones 2023   11/28/21 small stone currently, taking flomax   Hypertension    Patient Active Problem List   Diagnosis Date Noted   Vaginal discharge 04/01/2022   Central retinal artery  occlusion of right eye 10/25/2021   Symptomatic anemia 06/06/2021   Hyperglycemia due to diabetes mellitus ([place]) 06/06/2021   Elevated brain natriuretic peptide (BNP) level 06/06/2021   Atherosclerosis of aorta ([place]) 05/18/2020   Cirrhosis of liver without ascites ([place]) 05/18/2020   Internal hemorrhoids 06/22/2019   Dysuria 02/20/2018   Anemia    Sepsis due to Escherichia coli (E. coli) ([place])    AKI (acute kidney injury) ([place])    Sepsis ([place]) 09/19/2017   UTI (urinary tract infection) 09/19/2017   Chronic kidney disease (CKD), stage II (mild) 09/19/2017   Leukopenia 09/19/2017   Chronic diastolic CHF (congestive heart failure) ([place]) 09/19/2017   Statins contraindicated 06/12/2017   Hepatotoxicity due to statin drug 06/12/2017   History of adenomatous polyp of colon 03/18/2017   Viral URI with cough 01/17/2017   Thrombocytopenia ([place]) 12/19/2015   Insomnia 08/21/2015   Nicotine use disorder 05/21/2015   Eczema 05/21/2015   Fatty liver disease, nonalcoholic 123456   Hyperlipidemia 11/03/2007   History of CVA (cerebrovascular accident) 10/20/2007   Type II diabetes mellitus with renal manifestations ([place]) 12/10/2006   Essential hypertension 12/10/2006   History of breast cancer 12/10/2006   Past Surgical History:  Procedure Laterality Date   ARTERY BIOPSY Left 11/29/2021   Procedure: LEFT BIOPSY TEMPORAL ARTERY;  Surgeon: Angelia Mould, MD;  Location[facility] OR;  Service: Vascular;  Laterality: Left;   CATARACT EXTRACTION Bilateral    MASTECTOMY Left    TUBAL LIGATION  Current Outpatient Medications on File Prior to Visit  Medication Sig Dispense Refill   atenolol (TENORMIN) 100 MG tablet Take 1 tablet (100 mg total) by mouth 2 (two) times daily. 180 tablet 1   blood glucose meter kit and supplies KIT Dispense based on patient and insurance preference. Use up to four times daily as directed. 1 each 0   Blood Glucose Monitoring Suppl (ONETOUCH ULTRALINK) w/Device KIT  Test twice daily (onetouch ultra) 1 each 0   Blood Pressure Monitoring (BLOOD PRESSURE CUFF) MISC Use daily to monitor blood pressue 1 each 0   Continuous Blood Gluc Receiver (DEXCOM G7 RECEIVER) DEVI 1 Device by Does not apply route as directed. 1 each 0   Continuous Blood Gluc Sensor (DEXCOM G7 SENSOR) MISC 1 Device by Does not apply route every 21 ( twenty-one) days. 2 each 11   Dulaglutide (TRULICITY) 1.5 0000000 SOPN INJECT 1.5 MG (0.5ML) UNDER THE SKIN ONCE A WEEK 2 mL 3   ferrous gluconate (FERGON) 324 MG tablet Take 324 mg by mouth daily with breakfast.     furosemide (LASIX) 20 MG tablet Take 1 tablet (20 mg total) by mouth daily for 3 days. 3 tablet 0   glipiZIDE (GLUCOTROL XL) 10 MG 24 hr tablet TAKE 1 TABLET BY MOUTH TWICE A DAY 180 tablet 1   glucose blood (ONETOUCH ULTRA) test strip Use as instructed for blood sugar testing 3 times daily. 100 each 11   Lancets (ONETOUCH ULTRASOFT) lancets Use as instructed 100 each 11   Multiple Vitamin (MULTIVITAMIN) tablet Take 1 tablet by mouth daily.     naproxen sodium (ALEVE) 220 MG tablet Take 220 mg by mouth daily as needed.     pantoprazole (PROTONIX) 40 MG tablet TAKE 1 TABLET BY MOUTH EVERY DAY 90 tablet 0   prednisoLONE acetate (PRED FORTE) 1 % ophthalmic suspension      No current facility-administered medications on file prior to visit.    Allergies  Allergen Reactions   Ace Inhibitors Cough    ????   Bee Venom Swelling   Chlorthalidone     REACTION: unspecified   Metformin     REACTION: gi side effects   Penicillins     REACTION: urticaria (hives)   Sulfamethoxazole     REACTION: questionable   Social History   Occupational History   Not on file  Tobacco Use   Smoking status: Some Days    Packs/day: 0.20    Years: 10.00    Total pack years: 2.00    Types: Cigarettes   Smokeless tobacco: Never  Vaping Use   Vaping Use: Never used  Substance and Sexual Activity   Alcohol use: Not Currently   Drug use: No    Sexual activity: Not Currently   Family History  Problem Relation Age of Onset   Stroke Mother    Cancer Father        prostate   Diabetes Sister    Pancreatic cancer Sister    Immunization History  Administered Date(s) Administered   COVID-19, mRNA, vaccine(Comirnaty)12 years and older 07/03/2022   Fluad Quad(high Dose 65+) 07/05/2020, 07/03/2022   Influenza Split 04/30/2012   Influenza Whole 06/01/2007, 04/27/2008, 06/05/2009, 05/02/2010, 04/28/2011   Influenza, High Dose Seasonal PF 03/14/2015, 04/04/2016, 04/30/2017, 04/30/2018, 05/04/2019   Influenza,inj,Quad PF,6+ Mos 03/23/2013   Influenza-Unspecified 04/11/2014   PFIZER Comirnaty(Gray Top)Covid-19 Tri-Sucrose Vaccine 03/11/2021   PFIZER(Purple Top)SARS-COV-2 Vaccination 09/25/2019, 10/25/2019, 05/12/2020, 03/11/2021   Pneumococcal Conjugate-13 06/30/2014   Pneumococcal Polysaccharide-23 03/21/2009,  08/21/2015   Tdap 09/22/2011   Zoster, Live 03/19/2011     Review of Systems: Negative except as noted in the HPI.   Objective: There were no vitals filed for this visit.  Anita Mccormick is a pleasant 77 y.o. female in NAD. AAO X 3.  Vascular Examination: {jgvascular:23595}  Dermatological Examination: {jgderm:23598}  Neurological Examination: {jgneuro:23601::"Protective sensation intact 5/5 intact bilaterally with 10g monofilament b/l.","Vibratory sensation intact b/l.","Proprioception intact bilaterally."}  Musculoskeletal Examination: {jgmsk:23600}  Footwear Assessment: Does the patient wear appropriate shoes? {Yes,No}. Does the patient need inserts/orthotics? {Yes,No}.  Lab Results  Component Value Date   HGBA1C 5.7 (A) 07/07/2022   VAS [place] ABI WITH/WO TBI  Result Date: 08/07/2022  LOWER EXTREMITY DOPPLER STUDY Patient Name:  Anita Mccormick  Date of Exam:   08/07/2022 Medical Rec #: [id number]       Accession #:    [id number] Date of Birth: 03/23/1946        Patient Gender: F Patient Age:   21 years Exam  Location:  [facility] Procedure:      VAS [place] ABI WITH/WO TBI Referring Phys: CHRISTOPHER DICKSON --------------------------------------------------------------------------------  Indications: Peripheral artery disease. High Risk Factors: Hypertension, hyperlipidemia, Diabetes, prior CVA.  Performing Technologist: Ralene Cork RVT  Examination Guidelines: A complete evaluation includes at minimum, Doppler waveform signals and systolic blood pressure reading at the level of bilateral brachial, anterior tibial, and posterior tibial arteries, when vessel segments are accessible. Bilateral testing is considered an integral part of a complete examination. Photoelectric Plethysmograph (PPG) waveforms and toe systolic pressure readings are included as required and additional duplex testing as needed. Limited examinations for reoccurring indications may be performed as noted.  ABI Findings: +---------+------------------+-----+----------+--------+ Right    Rt Pressure (mmHg)IndexWaveform  Comment  +---------+------------------+-----+----------+--------+ Brachial 122                                       +---------+------------------+-----+----------+--------+ PTA      248               2.03 monophasic         +---------+------------------+-----+----------+--------+ DP       255               2.09 monophasic         +---------+------------------+-----+----------+--------+ Great Toe21                0.17                    +---------+------------------+-----+----------+--------+ +---------+------------------+-----+----------+-------+ Left     Lt Pressure (mmHg)IndexWaveform  Comment +---------+------------------+-----+----------+-------+ Brachial 119                                      +---------+------------------+-----+----------+-------+ PTA      255               2.09 monophasic        +---------+------------------+-----+----------+-------+ DP       105                0.86 triphasic         +---------+------------------+-----+----------+-------+ Great Toe36                0.30                   +---------+------------------+-----+----------+-------+ +-------+-----------+-----------+------------+------------+  ABI/TBIToday's ABIToday's TBIPrevious ABIPrevious TBI +-------+-----------+-----------+------------+------------+ Right  Anita Mccormick         0.17       Anita Mccormick          0.5          +-------+-----------+-----------+------------+------------+ Left   Anita Mccormick         0.3        Anita Mccormick          0.5          +-------+-----------+-----------+------------+------------+  Previous ABI on 03/28/20:.  Summary: Right: Resting right ankle-brachial index indicates noncompressible right lower extremity arteries. The right toe-brachial index is abnormal. Left: Resting left ankle-brachial index indicates noncompressible left lower extremity arteries. The left toe-brachial index is abnormal. *See table(s) above for measurements and observations.  Electronically signed by Deitra Mayo MD on 08/07/2022 at 2:58:44 PM.    Final    ADA Risk Categorization: Low Risk :  Patient has all of the following: Intact protective sensation No prior foot ulcer  No severe deformity Pedal pulses present  High Risk  Patient has one or more of the following: Loss of protective sensation Absent pedal pulses Severe Foot deformity History of foot ulcer  Assessment: No diagnosis found.   Plan: No orders of the defined types were placed in this encounter.   No orders of the defined types were placed in this encounter.   None  {jgplan:23602::"-Patient/POA to call should there be question/concern in the interim."} Return in about 3 months (around 12/23/2022).  Marzetta Board, DPM

## 2022-10-01 DIAGNOSIS — C50912 Malignant neoplasm of unspecified site of left female breast: Secondary | ICD-10-CM | POA: Diagnosis not present

## 2022-10-03 ENCOUNTER — Ambulatory Visit (INDEPENDENT_AMBULATORY_CARE_PROVIDER_SITE_OTHER): Payer: Medicare HMO

## 2022-10-03 VITALS — Ht 62.0 in | Wt 149.0 lb

## 2022-10-03 DIAGNOSIS — Z0000 Encounter for general adult medical examination without abnormal findings: Secondary | ICD-10-CM

## 2022-10-03 NOTE — Progress Notes (Signed)
Subjective:   Anita Mccormick is a 77 y.o. female who presents for Medicare Annual (Subsequent) preventive examination.  Review of Systems    Virtual Visit via Telephone Note  I connected with  Anita Mccormick on 10/03/22 at 11:30 AM EST by telephone and verified that I am speaking with the correct person using two identifiers.  Location: Patient: Home Provider: Office Persons participating in the virtual visit: patient/Nurse Health Advisor   I discussed the limitations, risks, security and privacy concerns of performing an evaluation and management service by telephone and the availability of in person appointments. The patient expressed understanding and agreed to proceed.  Interactive audio and video telecommunications were attempted between this nurse and patient, however failed, due to patient having technical difficulties OR patient did not have access to video capability.  We continued and completed visit with audio only.  Some vital signs may be absent or patient reported.   Anita Peaches, LPN  Cardiac Risk Factors include: advanced age (>26mn, >>2women);diabetes mellitus;hypertension     Objective:    Today's Vitals   10/03/22 1127  Weight: 149 lb (67.6 kg)  Height: '5\' 2"'$  (1.575 m)   Body mass index is 27.25 kg/m.     10/03/2022   11:42 AM 11/29/2021    9:00 AM 10/01/2021    1:43 PM 06/06/2021   11:56 AM 09/20/2017   12:00 AM 12/19/2015   10:23 AM 09/14/2015   11:27 AM  Advanced Directives  Does Patient Have a Medical Advance Directive? No Yes No No No No Yes  Type of Advance Directive  HHalchitain Chart?  No - copy requested       Would patient like information on creating a medical advance directive? No - Patient declined   No - Patient declined No - Patient declined Yes - Educational materials given     Current Medications (verified) Outpatient Encounter Medications as of 10/03/2022  Medication  Sig   atenolol (TENORMIN) 100 MG tablet Take 1 tablet (100 mg total) by mouth 2 (two) times daily.   blood glucose meter kit and supplies KIT Dispense based on patient and insurance preference. Use up to four times daily as directed.   Blood Glucose Monitoring Suppl (ONETOUCH ULTRALINK) w/Device KIT Test twice daily (onetouch ultra)   Blood Pressure Monitoring (BLOOD PRESSURE CUFF) MISC Use daily to monitor blood pressue   Continuous Blood Gluc Receiver (DEXCOM G7 RECEIVER) DEVI 1 Device by Does not apply route as directed.   Continuous Blood Gluc Sensor (DEXCOM G7 SENSOR) MISC 1 Device by Does not apply route every 21 ( twenty-one) days.   Dulaglutide (TRULICITY) 1.5 M0000000SOPN INJECT 1.5 MG (0.5ML) UNDER THE SKIN ONCE A WEEK   ferrous gluconate (FERGON) 324 MG tablet Take 324 mg by mouth daily with breakfast.   furosemide (LASIX) 20 MG tablet Take 1 tablet (20 mg total) by mouth daily for 3 days.   glipiZIDE (GLUCOTROL XL) 10 MG 24 hr tablet TAKE 1 TABLET BY MOUTH TWICE A DAY   glucose blood (ONETOUCH ULTRA) test strip Use as instructed for blood sugar testing 3 times daily.   Lancets (ONETOUCH ULTRASOFT) lancets Use as instructed   Multiple Vitamin (MULTIVITAMIN) tablet Take 1 tablet by mouth daily.   naproxen sodium (ALEVE) 220 MG tablet Take 220 mg by mouth daily as needed.   pantoprazole (PROTONIX) 40 MG tablet TAKE 1 TABLET  BY MOUTH EVERY DAY   prednisoLONE acetate (PRED FORTE) 1 % ophthalmic suspension    No facility-administered encounter medications on file as of 10/03/2022.    Allergies (verified) Ace inhibitors, Bee venom, Chlorthalidone, Metformin, Penicillins, and Sulfamethoxazole   History: Past Medical History:  Diagnosis Date   Arthritis    Cancer ([place])    breast left   Cerebrovascular accident [facility])    October 29 2021- Left eye- blind   COLONIC POLYPS, HX OF 12/10/2006   Qualifier: Diagnosis of  By: Leanne Chang MD, Bruce     Diabetes mellitus type II, controlled ([place])  12/10/2006   Poor control on Januvia '100mg'$  and glipizide '10mg'$  XL Lab Results  Component Value Date   HGBA1C 8.3* 11/02/2014        Eczema    Fatty liver disease, nonalcoholic 123456   Per Dr. Leanne Chang Lab Results  Component Value Date   ALT 31 11/02/2014   AST 57* 11/02/2014   ALKPHOS 104 11/02/2014   BILITOT 1.1 11/02/2014       GERD (gastroesophageal reflux disease)    History of blood transfusion    History of kidney stones 2023   11/28/21 small stone currently, taking flomax   Hypertension    Past Surgical History:  Procedure Laterality Date   ARTERY BIOPSY Left 11/29/2021   Procedure: LEFT BIOPSY TEMPORAL ARTERY;  Surgeon: Angelia Mould, MD;  Location: [facility] OR;  Service: Vascular;  Laterality: Left;   CATARACT EXTRACTION Bilateral    MASTECTOMY Left    TUBAL LIGATION     Family History  Problem Relation Age of Onset   Stroke Mother    Cancer Father        prostate   Diabetes Sister    Pancreatic cancer Sister    Social History   Socioeconomic History   Marital status: Married    Spouse name: Not on file   Number of children: Not on file   Years of education: Not on file   Highest education level: Not on file  Occupational History   Not on file  Tobacco Use   Smoking status: Some Days    Packs/day: 0.20    Years: 10.00    Total pack years: 2.00    Types: Cigarettes   Smokeless tobacco: Never  Vaping Use   Vaping Use: Never used  Substance and Sexual Activity   Alcohol use: Not Currently   Drug use: No   Sexual activity: Not Currently  Other Topics Concern   Not on file  Social History Narrative   Married (husband Lynann Bologna). 4 children. 11 grandkids. 1 greatgrandchild.       Still working as homemaker      Hobbies: playing cards, board games, cooking, Air traffic controller   Social Determinants of Health   Financial Resource Strain: [place]  (10/03/2022)   Overall Financial Resource Strain (CARDIA)    Difficulty of Paying Living Expenses: Not hard at all   Food Insecurity: No Food Insecurity (10/03/2022)   Hunger Vital Sign    Worried About Running Out of Food in the Last Year: Never true    [place] in the Last Year: Never true  Transportation Needs: No Transportation Needs (10/03/2022)   PRAPARE - Hydrologist (Medical): No    Lack of Transportation (Non-Medical): No  Physical Activity: Inactive (10/03/2022)   Exercise Vital Sign    Days of Exercise per Week: 0 days    Minutes of Exercise per  Session: 0 min  Stress: No Stress Concern Present (10/03/2022)   [place]    Feeling of Stress : Not at all  Social Connections: Moderately Integrated (10/03/2022)   Social Connection and Isolation Panel [NHANES]    Frequency of Communication with Friends and Family: More than three times a week    Frequency of Social Gatherings with Friends and Family: More than three times a week    Attends Religious Services: Never    Marine scientist or Organizations: Yes    Attends Music therapist: More than 4 times per year    Marital Status: Married    Tobacco Counseling Ready to quit: Yes Counseling given: Yes   Clinical Intake:  Pre-visit preparation completed: Yes  Pain : No/denies pain    Nutrition Risk Assessment:  Has the patient had any N/V/D within the last 2 months?  No  Does the patient have any non-healing wounds?  No  Has the patient had any unintentional weight loss or weight gain?  No   Diabetes:  Is the patient diabetic?  Yes  If diabetic, was a CBG obtained today?  No  Did the patient bring in their glucometer from home?  No  How often do you monitor your CBG's? Daily.   Financial Strains and Diabetes Management:  Are you having any financial strains with the device, your supplies or your medication? No .  Does the patient want to be seen by Chronic Care Management for management of their diabetes?  No   Would the patient like to be referred to a Nutritionist or for Diabetic Management?  No   Diabetic Exams:  Diabetic Eye Exam: Completed No. Overdue for diabetic eye exam. Pt has been advised about the importance in completing this exam. A referral has been placed today. Message sent to referral coordinator for scheduling purposes. Advised pt to expect a call from office referred to regarding appt.  Diabetic Foot Exam: Completed No. Pt has been advised about the importance in completing this exam. Pt is scheduled for diabetic foot exam on Followed by PCP.   BMI - recorded: 27.75 Nutritional Status: BMI 25 -29 Overweight Nutritional Risks: None Diabetes: Yes CBG done?: No Did pt. bring in CBG monitor from home?: Yes Glucose Meter Downloaded?: No  How often do you need to have someone help you when you read instructions, pamphlets, or other written materials from your doctor or pharmacy?: 3 - Sometimes (Family Assist)  Diabetic?  No  Interpreter Needed?: No  Information entered by :: Rolene Arbour LPN   Activities of Daily Living    10/03/2022   11:38 AM 11/29/2021    8:58 AM  In your present state of health, do you have any difficulty performing the following activities:  Hearing? 0 0  Vision? 0 1  Difficulty concentrating or making decisions? 0 0  Walking or climbing stairs? 0 1  Dressing or bathing? 0 0  Doing errands, shopping? 0   Preparing Food and eating ? N   Using the Toilet? N   In the past six months, have you accidently leaked urine? Y   Comment Wears pads. Followed by PCP   Do you have problems with loss of bowel control? N   Managing your Medications? N   Managing your Finances? N   Housekeeping or managing your Housekeeping? N     Patient Care Team: Billie Ruddy, MD as PCP - General (Family Medicine)  [place], Peter M, MD as PCP - Cardiology (Cardiology) Warden Fillers, MD as Consulting Physician (Ophthalmology) Viona Gilmore, [facility] (Inactive) as  Pharmacist (Pharmacist)  Indicate any recent Medical Services you may have received from other than [facility] providers in the past year (date may be approximate).     Assessment:   This is a routine wellness examination for [facility].  Hearing/Vision screen Hearing Screening - Comments:: Denies hearing difficulties   Vision Screening - Comments:: Wears rx glasses - up to date with routine eye exams with  Dr Katy Fitch  Dietary issues and exercise activities discussed: Exercise limited by: None identified   Goals Addressed               This Visit's Progress     Stay healthy (pt-stated)        I want to become stronger,       Depression Screen    10/03/2022   11:37 AM 10/01/2021    1:49 PM 03/18/2017    8:18 AM 12/19/2015   10:23 AM 09/14/2015   11:28 AM 06/30/2014    8:33 AM 09/27/2013   10:30 AM  PHQ 2/9 Scores  PHQ - 2 Score 0 0 0 0 0 0 0    Fall Risk    10/03/2022   11:40 AM 10/01/2021    1:47 PM 03/07/2021    2:42 PM 03/18/2017    8:18 AM 12/19/2015   10:23 AM  [place] in the past year? '1 1 1 '$ Yes Yes  Comment  was clammy, sweaty and fell     Number falls in past yr: 0 0 0 2 or more 1  Injury with Fall? 1 0 0 Yes No  Comment Skin tear to rt knee. Followed by medical attention      Risk for fall due to : No Fall Risks Impaired balance/gait;Impaired mobility;Medication side effect  Other (Comment)   Risk for fall due to: Comment    leg strength   Follow up Falls prevention discussed Falls evaluation completed;Education provided;Falls prevention discussed       FALL RISK PREVENTION PERTAINING TO THE HOME:  Any stairs in or around the home? No  If so, are there any without handrails? No  Home free of loose throw rugs in walkways, pet beds, electrical cords, etc? Yes  Adequate lighting in your home to reduce risk of falls? Yes   ASSISTIVE DEVICES UTILIZED TO PREVENT FALLS:  Life alert? No  Use of a cane, walker or w/c? Yes  Grab bars in the bathroom? Yes  Shower  chair or bench in shower? Yes  Elevated toilet seat or a handicapped toilet? Yes  TIMED UP AND GO:  Was the test performed? No . Audio Visit   Cognitive Function:        10/03/2022   11:42 AM 10/01/2021    1:52 PM  6CIT Screen  What Year? 0 points 0 points  What month? 0 points 0 points  What time? 0 points 0 points  Count back from 20 0 points 0 points  Months in reverse 0 points 4 points  Repeat phrase 2 points 0 points  Total Score 2 points 4 points    Immunizations Immunization History  Administered Date(s) Administered   COVID-19, mRNA, vaccine(Comirnaty)12 years and older 07/03/2022   Fluad Quad(high Dose 65+) 07/05/2020, 07/03/2022   Influenza Split 04/30/2012   Influenza Whole 06/01/2007, 04/27/2008, 06/05/2009, 05/02/2010, 04/28/2011   Influenza, High Dose Seasonal PF 03/14/2015, 04/04/2016, 04/30/2017,  04/30/2018, 05/04/2019   Influenza,inj,Quad PF,6+ Mos 03/23/2013   Influenza-Unspecified 04/11/2014   PFIZER Comirnaty(Gray Top)Covid-19 Tri-Sucrose Vaccine 03/11/2021   PFIZER(Purple Top)SARS-COV-2 Vaccination 09/25/2019, 10/25/2019, 05/12/2020, 03/11/2021   Pneumococcal Conjugate-13 06/30/2014   Pneumococcal Polysaccharide-23 03/21/2009, 08/21/2015   Tdap 09/22/2011   Zoster, Live 03/19/2011    TDAP status: Due, Education has been provided regarding the importance of this vaccine. Advised may receive this vaccine at local pharmacy or Health Dept. Aware to provide a copy of the vaccination record if obtained from local pharmacy or Health Dept. Verbalized acceptance and understanding.  Flu Vaccine status: Up to date  Pneumococcal vaccine status: Up to date  Covid-19 vaccine status: Completed vaccines  Qualifies for Shingles Vaccine? Yes   Zostavax completed No   Shingrix Completed?: No.    Education has been provided regarding the importance of this vaccine. Patient has been advised to call insurance company to determine out of pocket expense if they have not  yet received this vaccine. Advised may also receive vaccine at local pharmacy or Health Dept. Verbalized acceptance and understanding.  Screening Tests Health Maintenance  Topic Date Due   DTaP/Tdap/Td (2 - Td or Tdap) 09/21/2021   Diabetic kidney evaluation - Urine ACR  10/04/2022 (Originally 06/07/2020)   COVID-19 Vaccine (6 - 2023-24 season) 10/19/2022 (Originally 08/28/2022)   Zoster Vaccines- Shingrix (1 of 2) 01/03/2023 (Originally 10/28/1964)   OPHTHALMOLOGY EXAM  11/21/2022   Diabetic kidney evaluation - eGFR measurement  12/17/2022   HEMOGLOBIN A1C  01/06/2023   FOOT EXAM  09/25/2023   Medicare Annual Wellness (AWV)  10/03/2023   COLONOSCOPY (Pts 45-32yr Insurance coverage will need to be confirmed)  08/21/2029   Pneumonia Vaccine 77 Years old  Completed   INFLUENZA VACCINE  Completed   DEXA SCAN  Completed   Hepatitis C Screening  Completed   HPV VACCINES  Aged Out    Health Maintenance  Health Maintenance Due  Topic Date Due   DTaP/Tdap/Td (2 - Td or Tdap) 09/21/2021    Colorectal cancer screening: Type of screening: Colonoscopy. Completed 08/22/19. Repeat every 10 years    Bone Density status: Completed 03/11/21. Results reflect: Bone density results: OSTEOPOROSIS. Repeat every   years.  Lung Cancer Screening: (Low Dose CT Chest recommended if Age 76-80years, 30 pack-year currently smoking OR have quit w/in 15years.) does qualify.   Lung Cancer Screening Referral: Deferred  Additional Screening:  Hepatitis C Screening: does qualify; Completed 09/28/12  Vision Screening: Recommended annual ophthalmology exams for early detection of glaucoma and other disorders of the eye. Is the patient up to date with their annual eye exam?  Yes  Who is the provider or what is the name of the office in which the patient attends annual eye exams? Dr GKaty FitchIf pt is not established with a provider, would they like to be referred to a provider to establish care? No .   Dental  Screening: Recommended annual dental exams for proper oral hygiene  Community Resource Referral / Chronic Care Management:  CRR required this visit?  No   CCM required this visit?  No      Plan:     I have personally reviewed and noted the following in the patient's chart:   Medical and social history Use of alcohol, tobacco or illicit drugs  Current medications and supplements including opioid prescriptions. Patient is not currently taking opioid prescriptions. Functional ability and status Nutritional status Physical activity Advanced directives List of other physicians Hospitalizations, surgeries, and ER  visits in previous 12 months Vitals Screenings to include cognitive, depression, and falls Referrals and appointments  In addition, I have reviewed and discussed with patient certain preventive protocols, quality metrics, and best practice recommendations. A written personalized care plan for preventive services as well as general preventive health recommendations were provided to patient.     Anita Peaches, LPN   [id number]   Nurse Notes: Patient due Diabetic kidney evaluation-Urine ACR

## 2022-10-03 NOTE — Patient Instructions (Addendum)
Anita Mccormick , Thank you for taking time to come for your Medicare Wellness Visit. I appreciate your ongoing commitment to your health goals. Please review the following plan we discussed and let me know if I can assist you in the future.   These are the goals we discussed:  Goals       Patient Stated      10/01/2021, to do better, wants feet to get better      Quit smoking / using tobacco      Stay healthy (pt-stated)      I want to become stronger,        This is a list of the screening recommended for you and due dates:  Health Maintenance  Topic Date Due   DTaP/Tdap/Td vaccine (2 - Td or Tdap) 09/21/2021   Yearly kidney health urinalysis for diabetes  10/04/2022*   COVID-19 Vaccine (6 - 2023-24 season) 10/19/2022*   Zoster (Shingles) Vaccine (1 of 2) 01/03/2023*   Eye exam for diabetics  11/21/2022   Yearly kidney function blood test for diabetes  12/17/2022   Hemoglobin A1C  01/06/2023   Complete foot exam   09/25/2023   Medicare Annual Wellness Visit  10/03/2023   Colon Cancer Screening  08/21/2029   Pneumonia Vaccine  Completed   Flu Shot  Completed   DEXA scan (bone density measurement)  Completed   Hepatitis C Screening: USPSTF Recommendation to screen - Ages 68-79 yo.  Completed   HPV Vaccine  Aged Out  *Topic was postponed. The date shown is not the original due date.    Advanced directives: Advance directive discussed with you today. Even though you declined this today, please call our office should you change your mind, and we can give you the proper paperwork for you to fill out.   Conditions/risks identified: None  Next appointment: Follow up in one year for your annual wellness visit    Preventive Care 65 Years and Older, Female Preventive care refers to lifestyle choices and visits with your health care provider that can promote health and wellness. What does preventive care include? A yearly physical exam. This is also called an annual well check. Dental  exams once or twice a year. Routine eye exams. Ask your health care provider how often you should have your eyes checked. Personal lifestyle choices, including: Daily care of your teeth and gums. Regular physical activity. Eating a healthy diet. Avoiding tobacco and drug use. Limiting alcohol use. Practicing safe sex. Taking low-dose aspirin every day. Taking vitamin and mineral supplements as recommended by your health care provider. What happens during an annual well check? The services and screenings done by your health care provider during your annual well check will depend on your age, overall health, lifestyle risk factors, and family history of disease. Counseling  Your health care provider may ask you questions about your: Alcohol use. Tobacco use. Drug use. Emotional well-being. Home and relationship well-being. Sexual activity. Eating habits. History of falls. Memory and ability to understand (cognition). Work and work Statistician. Reproductive health. Screening  You may have the following tests or measurements: Height, weight, and BMI. Blood pressure. Lipid and cholesterol levels. These may be checked every 5 years, or more frequently if you are over 67 years old. Skin check. Lung cancer screening. You may have this screening every year starting at age 65 if you have a 30-pack-year history of smoking and currently smoke or have quit within the past 15 years. Fecal occult blood  test (FOBT) of the stool. You may have this test every year starting at age 4. Flexible sigmoidoscopy or colonoscopy. You may have a sigmoidoscopy every 5 years or a colonoscopy every 10 years starting at age 3. Hepatitis C blood test. Hepatitis B blood test. Sexually transmitted disease (STD) testing. Diabetes screening. This is done by checking your blood sugar (glucose) after you have not eaten for a while (fasting). You may have this done every 1-3 years. Bone density scan. This is done  to screen for osteoporosis. You may have this done starting at age 80. Mammogram. This may be done every 1-2 years. Talk to your health care provider about how often you should have regular mammograms. Talk with your health care provider about your test results, treatment options, and if necessary, the need for more tests. Vaccines  Your health care provider may recommend certain vaccines, such as: Influenza vaccine. This is recommended every year. Tetanus, diphtheria, and acellular pertussis (Tdap, Td) vaccine. You may need a Td booster every 10 years. Zoster vaccine. You may need this after age 59. Pneumococcal 13-valent conjugate (PCV13) vaccine. One dose is recommended after age 73. Pneumococcal polysaccharide (PPSV23) vaccine. One dose is recommended after age 3. Talk to your health care provider about which screenings and vaccines you need and how often you need them. This information is not intended to replace advice given to you by your health care provider. Make sure you discuss any questions you have with your health care provider. Document Released: 08/10/2015 Document Revised: 04/02/2016 Document Reviewed: 05/15/2015 Elsevier Interactive Patient Education  2017 [place] Prevention in the Home Falls can cause injuries. They can happen to people of all ages. There are many things you can do to make your home safe and to help prevent falls. What can I do on the outside of my home? Regularly fix the edges of walkways and driveways and fix any cracks. Remove anything that might make you trip as you walk through a door, such as a raised step or threshold. Trim any bushes or trees on the path to your home. Use bright outdoor lighting. Clear any walking paths of anything that might make someone trip, such as rocks or tools. Regularly check to see if handrails are loose or broken. Make sure that both sides of any steps have handrails. Any raised decks and porches should have  guardrails on the edges. Have any leaves, snow, or ice cleared regularly. Use sand or salt on walking paths during winter. Clean up any spills in your garage right away. This includes oil or grease spills. What can I do in the bathroom? Use night lights. Install grab bars by the toilet and in the tub and shower. Do not use towel bars as grab bars. Use non-skid mats or decals in the tub or shower. If you need to sit down in the shower, use a plastic, non-slip stool. Keep the floor dry. Clean up any water that spills on the floor as soon as it happens. Remove soap buildup in the tub or shower regularly. Attach bath mats securely with double-sided non-slip rug tape. Do not have throw rugs and other things on the floor that can make you trip. What can I do in the bedroom? Use night lights. Make sure that you have a light by your bed that is easy to reach. Do not use any sheets or blankets that are too big for your bed. They should not hang down onto the floor. Have  a firm chair that has side arms. You can use this for support while you get dressed. Do not have throw rugs and other things on the floor that can make you trip. What can I do in the kitchen? Clean up any spills right away. Avoid walking on wet floors. Keep items that you use a lot in easy-to-reach places. If you need to reach something above you, use a strong step stool that has a grab bar. Keep electrical cords out of the way. Do not use floor polish or wax that makes floors slippery. If you must use wax, use non-skid floor wax. Do not have throw rugs and other things on the floor that can make you trip. What can I do with my stairs? Do not leave any items on the stairs. Make sure that there are handrails on both sides of the stairs and use them. Fix handrails that are broken or loose. Make sure that handrails are as long as the stairways. Check any carpeting to make sure that it is firmly attached to the stairs. Fix any carpet  that is loose or worn. Avoid having throw rugs at the top or bottom of the stairs. If you do have throw rugs, attach them to the floor with carpet tape. Make sure that you have a light switch at the top of the stairs and the bottom of the stairs. If you do not have them, ask someone to add them for you. What else can I do to help prevent falls? Wear shoes that: Do not have high heels. Have rubber bottoms. Are comfortable and fit you well. Are closed at the toe. Do not wear sandals. If you use a stepladder: Make sure that it is fully opened. Do not climb a closed stepladder. Make sure that both sides of the stepladder are locked into place. Ask someone to hold it for you, if possible. Clearly mark and make sure that you can see: Any grab bars or handrails. First and last steps. Where the edge of each step is. Use tools that help you move around (mobility aids) if they are needed. These include: Canes. Walkers. Scooters. Crutches. Turn on the lights when you go into a dark area. Replace any light bulbs as soon as they burn out. Set up your furniture so you have a clear path. Avoid moving your furniture around. If any of your floors are uneven, fix them. If there are any pets around you, be aware of where they are. Review your medicines with your doctor. Some medicines can make you feel dizzy. This can increase your chance of falling. Ask your doctor what other things that you can do to help prevent falls. This information is not intended to replace advice given to you by your health care provider. Make sure you discuss any questions you have with your health care provider. Document Released: 05/10/2009 Document Revised: 12/20/2015 Document Reviewed: 08/18/2014 Elsevier Interactive Patient Education  2017 [employer].

## 2022-10-29 ENCOUNTER — Encounter: Payer: Self-pay | Admitting: Family Medicine

## 2022-10-29 ENCOUNTER — Ambulatory Visit (INDEPENDENT_AMBULATORY_CARE_PROVIDER_SITE_OTHER): Payer: Medicare HMO | Admitting: Family Medicine

## 2022-10-29 VITALS — BP 124/60 | HR 84 | Temp 98.0°F | Resp 16 | Wt 149.0 lb

## 2022-10-29 DIAGNOSIS — E162 Hypoglycemia, unspecified: Secondary | ICD-10-CM | POA: Diagnosis not present

## 2022-10-29 DIAGNOSIS — E1129 Type 2 diabetes mellitus with other diabetic kidney complication: Secondary | ICD-10-CM

## 2022-10-29 DIAGNOSIS — R143 Flatulence: Secondary | ICD-10-CM

## 2022-10-29 DIAGNOSIS — K582 Mixed irritable bowel syndrome: Secondary | ICD-10-CM | POA: Diagnosis not present

## 2022-10-29 MED ORDER — SEMAGLUTIDE(0.25 OR 0.5MG/DOS) 2 MG/1.5ML ~~LOC~~ SOPN: 0.5000 mg | PEN_INJECTOR | SUBCUTANEOUS | 2 refills | Status: DC

## 2022-10-29 MED ORDER — DEXCOM G7 RECEIVER DEVI: 1.0000 | 0 refills | Status: DC

## 2022-10-29 MED ORDER — DEXCOM G7 SENSOR MISC: 1.0000 | 11 refills | Status: DC

## 2022-10-29 NOTE — Patient Instructions (Signed)
A prescription for semaglutide (Ozempic) 0.5 mg weekly was sent to your pharmacy.  You can take this instead of Trulicity to see if your stomach issues improve.  Continue taking glipizide XL 10 mg twice a day.  We will see if your insurance company will cover a continuous glucometers.

## 2022-10-29 NOTE — Progress Notes (Signed)
Established Patient Office Visit   Subjective  Patient ID: Anita Mccormick, female    DOB: 03/31/1946  Age: 77 y.o. MRN: [id number]  Chief Complaint  Patient presents with   GI Problem    Patient complains of flatulence, bowel incontinence, diarrhea. "Going on for a while"    Patient is a 77 year old female with pmh sig for HTN, DM2, CKD, central retinal occlusion of right eye, cirrhosis, statin induced hepatotoxicity, chronic diastolic CHF, nicotine use, history of breast cancer, history of CVA, vaginal discharge who was seen for acute concern.  Patient with increased flatus and diarrhea.  Something on TV about a class action lawsuit against Trulicity.  Inquires if it can be contributing to GI symptoms.  Pt seen by Dr. Thana Farr prior to his retirement.  Patient making changes to diet if still having symptoms.  Also notes urinary frequency.  Drinking 5-6 eight ounce glasses of water per day and eating ice.  Denies loss of bowel or bladder.  Patient requesting prescription for continuous glucometer sensor and receiver be reset the recent.  Patient not checking blood sugar as does not like sticking herself multiple times.  Company, [employer] pt was using claimed they did not receive paperwork for [facility] though it was sent several times.  Patient notes callus on right heel causing soreness.  Also with callus on the right fifth digit.  Followed by podiatry.  Wearing diabetic shoes.  Thinks callus on fifth digit due to bawling up her feet when she walks.  Advised to get heel cups.    Patient Active Problem List   Diagnosis Date Noted   Vaginal discharge 04/01/2022   Central retinal artery occlusion of right eye 10/25/2021   Symptomatic anemia 06/06/2021   Hyperglycemia due to diabetes mellitus 06/06/2021   Elevated brain natriuretic peptide (BNP) level 06/06/2021   Atherosclerosis of aorta 05/18/2020   Cirrhosis of liver without ascites 05/18/2020   Internal hemorrhoids 06/22/2019   Dysuria  02/20/2018   Anemia    Sepsis due to Escherichia coli (E. coli)    AKI (acute kidney injury)    Sepsis 09/19/2017   UTI (urinary tract infection) 09/19/2017   Chronic kidney disease (CKD), stage II (mild) 09/19/2017   Leukopenia 09/19/2017   Chronic diastolic CHF (congestive heart failure) 09/19/2017   Statins contraindicated 06/12/2017   Hepatotoxicity due to statin drug 06/12/2017   History of adenomatous polyp of colon 03/18/2017   Viral URI with cough 01/17/2017   Thrombocytopenia 12/19/2015   Insomnia 08/21/2015   Nicotine use disorder 05/21/2015   Eczema 05/21/2015   Fatty liver disease, nonalcoholic 123456   Hyperlipidemia 11/03/2007   History of CVA (cerebrovascular accident) 10/20/2007   Type II diabetes mellitus with renal manifestations 12/10/2006   Essential hypertension 12/10/2006   History of breast cancer 12/10/2006   Social History   Tobacco Use   Smoking status: Some Days    Packs/day: 0.20    Years: 10.00    Additional pack years: 0.00    Total pack years: 2.00    Types: Cigarettes   Smokeless tobacco: Never  Vaping Use   Vaping Use: Never used  Substance Use Topics   Alcohol use: Not Currently   Drug use: No   Family History  Problem Relation Age of Onset   Stroke Mother    Cancer Father        prostate   Diabetes Sister    Pancreatic cancer Sister    Allergies  Allergen Reactions   Ace  Inhibitors Cough    ????   Bee Venom Swelling   Chlorthalidone     REACTION: unspecified   Metformin     REACTION: gi side effects   Penicillins     REACTION: urticaria (hives)   Sulfamethoxazole     REACTION: questionable      ROS Negative unless stated above    Objective:     BP 124/60 (BP Location: Right Arm, Patient Position: Sitting, Cuff Size: Small)   Pulse 84   Temp 98 F (36.7 C) (Oral)   Resp 16   Wt 149 lb (67.6 kg)   SpO2 98%   BMI 27.25 kg/m    Physical Exam Constitutional:      General: She is not in acute  distress.    Appearance: Normal appearance.  HENT:     Head: Normocephalic and atraumatic.     Nose: Nose normal.     Mouth/Throat:     Mouth: Mucous membranes are moist.  Eyes:     Extraocular Movements: Extraocular movements intact.     Conjunctiva/sclera: Conjunctivae normal.  Cardiovascular:     Rate and Rhythm: Normal rate and regular rhythm.     Heart sounds: Normal heart sounds. No murmur heard.    No gallop.  Pulmonary:     Effort: Pulmonary effort is normal. No respiratory distress.     Breath sounds: Normal breath sounds. No wheezing, rhonchi or rales.  Abdominal:     General: Bowel sounds are normal. There is no distension.     Palpations: Abdomen is soft.  Skin:    General: Skin is warm and dry.  Neurological:     Mental Status: She is alert and oriented to person, place, and time. Mental status is at baseline.      No results found for any visits on 10/29/22.    Assessment & Plan:  Irritable bowel syndrome with both constipation and diarrhea  Flatus  Type 2 diabetes mellitus with other diabetic kidney complication, without long-term current use of insulin -     Semaglutide(0.25 or 0.5MG /DOS); Inject 0.5 mg into the skin once a week.  Dispense: 4.5 mL; Refill: 2  Increased GI symptoms likely 2/2 to GLP-1.  Discussed r/b/a of other diabetic medications such as Wilder Glade, however patient declines 2/2 possibility of yeast infections.  Wishes to switch to a different GLP-1 and a lower dose to see if symptoms continue.  Continue glipizide XL 10 mg twice daily.  Discussed the importance of monitoring blood sugar.  Consider low FODMAP diet.  Can place new referral to GI if needed  Return in 4 weeks (on 11/26/2022), or if symptoms worsen or fail to improve.   Billie Ruddy, MD

## 2022-10-31 ENCOUNTER — Telehealth: Payer: Self-pay | Admitting: Family Medicine

## 2022-10-31 ENCOUNTER — Telehealth: Payer: Self-pay | Admitting: Podiatry

## 2022-10-31 NOTE — Telephone Encounter (Signed)
Spoke to pt and she advised that she will finish taking the Trulicity. Pt will call to schedule an appt. For teaching on how to take Ozempic. Pt stated that she does not want a yeast infection from Ozempic. Pt stated that she is scared to try the medication.

## 2022-10-31 NOTE — Telephone Encounter (Signed)
Requesting call regarding her new type 2 medication, trulicity is "tearing up her stomach" but leary about taking the newly prescribed meds (ozempic)

## 2022-10-31 NOTE — Telephone Encounter (Signed)
Pt called asking if we had any heel guards in our office she stated when she was in last we did not have any. She said she was told they were around 20.00. In the note it said heel protector.Pt is starting to get a sore spot on her heel and is wanting to pick one up.   I wanted to make sure we are getting the pt what she needs. The heel protector/guard we sell is 3.00 and the gel heel pads are 42.00. Which one were you talking about. Is it one that goes around the whole heel that I see on [employer]?

## 2022-11-03 NOTE — Telephone Encounter (Signed)
I called pt and she would like to come to the office to pick up the heel protector.  I am going to take on to the front for the pt.

## 2022-11-03 NOTE — Telephone Encounter (Signed)
Ozempic is similar to the Trulicity.  Will not necessarily cause a yeast infection.  Remaining options include insulin.

## 2022-11-04 NOTE — Telephone Encounter (Signed)
Patient is aware.  She will finish using her Trulicity and then use Ozempic.

## 2022-11-05 ENCOUNTER — Telehealth: Payer: Self-pay

## 2022-11-05 ENCOUNTER — Other Ambulatory Visit (HOSPITAL_COMMUNITY): Payer: Self-pay

## 2022-11-05 NOTE — Telephone Encounter (Signed)
Patient Advocate Encounter   Received notification from [facility] that prior authorization is required for [facility] G7 Receiver device   Submitted: 11-05-2022 Key [id number]  Status is pending

## 2022-11-05 NOTE — Telephone Encounter (Signed)
Patient Advocate Encounter   Received notification from [facility] that prior authorization is required for [facility] G7 Sensor   Submitted: 11-05-2022 Key [id number]  Status is pending

## 2022-11-06 NOTE — Telephone Encounter (Signed)
Patient Advocate Encounter  Prior Authorization for [employer] Receiver device has been approved through [place].  Key: [id number]    Effective: 11-06-2022 to 07-27-2023

## 2022-11-06 NOTE — Telephone Encounter (Signed)
Patient Advocate Encounter  Prior Authorization for [employer] has been approved through [place].  Key: [id number]    Effective: 11-06-2022 to 07-27-2023

## 2022-11-07 NOTE — Telephone Encounter (Signed)
ATC but could not leave vm  

## 2022-11-15 IMAGING — DX DG CHEST 2V
2 series · 2 of 2 positions shown · non-contrast
Comparison: 03/11/2021

CLINICAL DATA: Shortness of breath

EXAM:
CHEST - 2 VIEW

[chest lat]
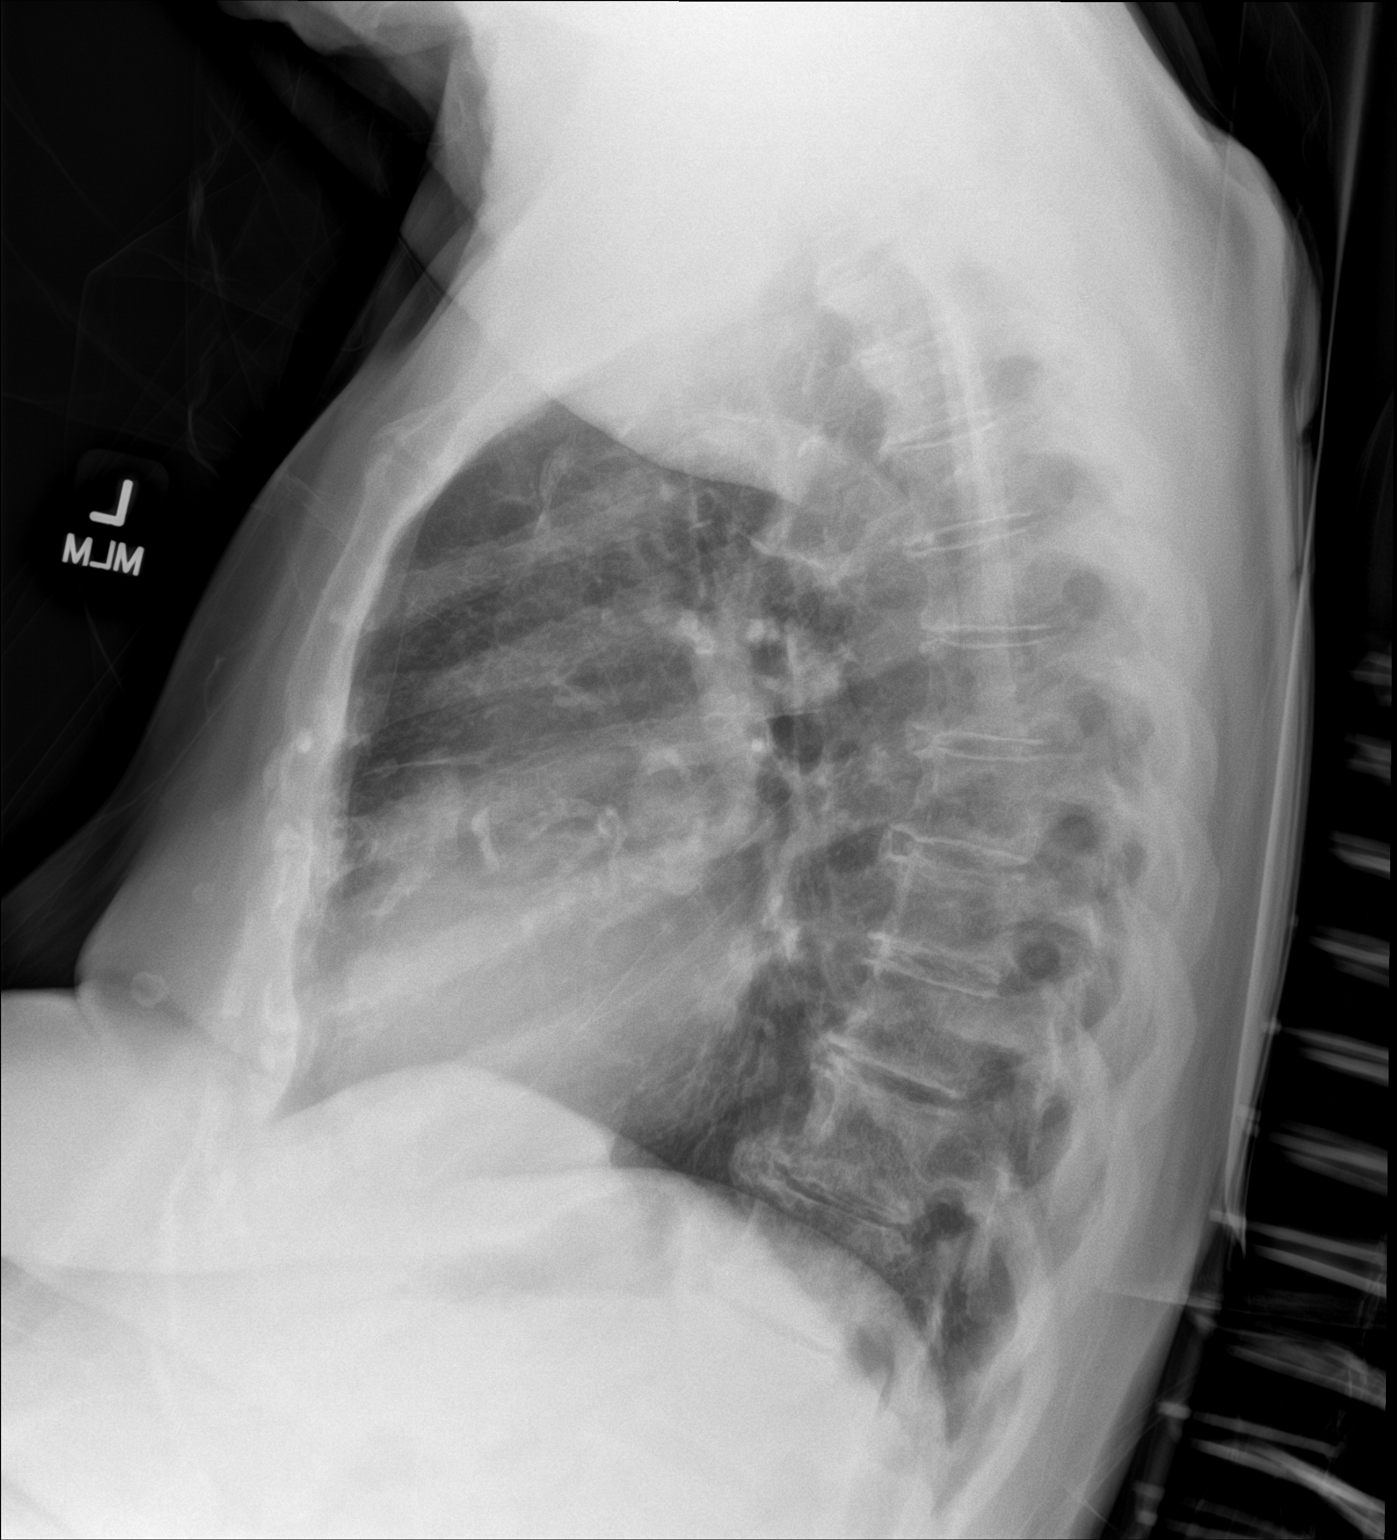

[chest ap]
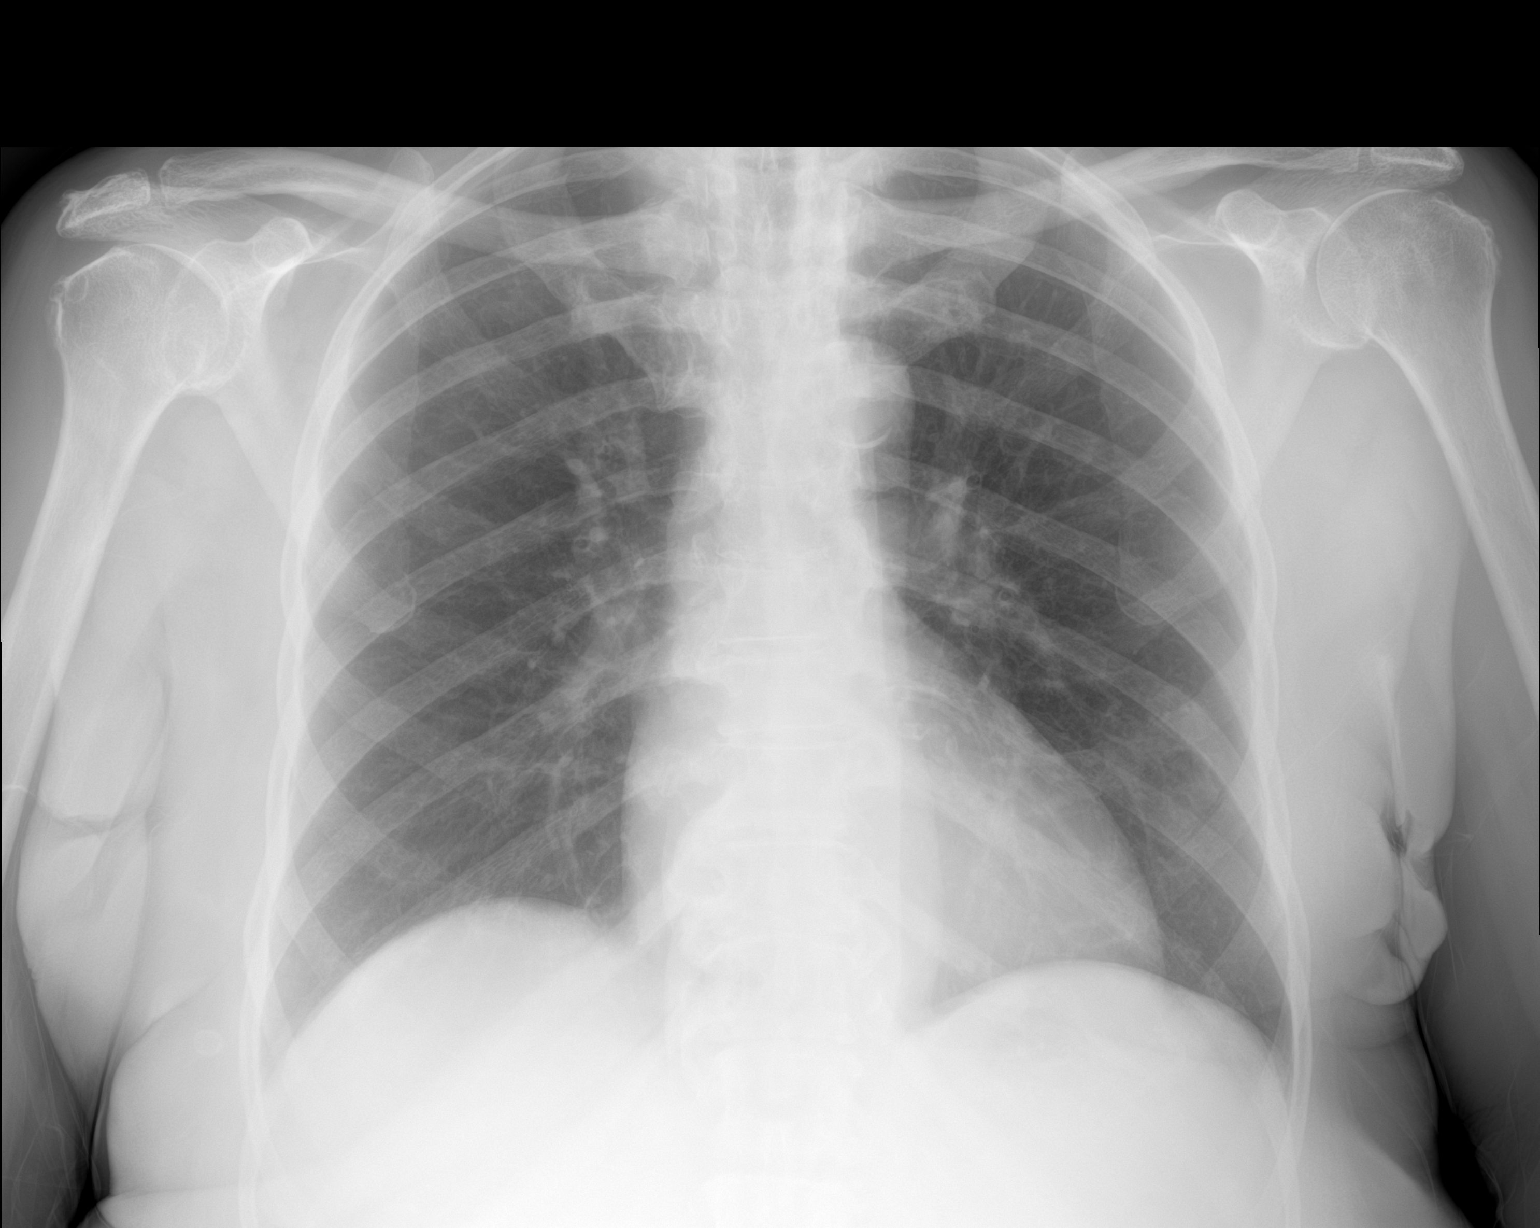

[2 of 2 positions shown; findings below may reference images not displayed]

FINDINGS: The heart size and mediastinal contours are within normal limits.
Aortic atherosclerosis. Both lungs are clear. The visualized
skeletal structures are unremarkable.
IMPRESSION: No active cardiopulmonary disease.

## 2022-11-17 ENCOUNTER — Telehealth: Payer: Self-pay | Admitting: Family Medicine

## 2022-11-17 NOTE — Telephone Encounter (Signed)
Pt call and stated she is returning your call and want a call back. 

## 2022-11-22 ENCOUNTER — Other Ambulatory Visit: Payer: Self-pay | Admitting: Family Medicine

## 2022-11-28 NOTE — Telephone Encounter (Signed)
Left detailed message on patient's voicemail informing patient that Dexcom reciever has been approved

## 2022-12-08 ENCOUNTER — Telehealth: Payer: Self-pay

## 2022-12-08 NOTE — Progress Notes (Signed)
Care Management & Coordination Services Pharmacy Team  Reason for Encounter: Hypertension  Contacted patient to discuss hypertension disease state. {US HC Outreach:28874}   SCHED FU Current antihypertensive regimen:  Atenolol 100 mg twice daily  Patient verbally confirms she is taking the above medications as directed. {yes/no:20286}  How often are you checking your Blood Pressure? {CHL HP BP Monitoring [phone]}  she checks her blood pressure {timing:25218} {before/after:25217} taking her medication.  Current home BP readings: *** DATE:             BP               PULSE   Wrist or arm cuff:  OTC medications including pseudoephedrine or NSAIDs?  Any readings above 180/100? {yes/no:20286} If yes any symptoms of hypertensive emergency? {hypertensive emergency symptoms:25354}  What recent interventions/DTPs have been made by any provider to improve Blood Pressure control since last CPP Visit: ***  Any recent hospitalizations or ED visits since last visit with CPP? {yes/no:20286}  What diet changes have been made to improve Blood Pressure Control?  Patient follows no specific diet Breakfast - oatmeal, bacon and fruit Lunch - a sandwich Dinner - meat with vegetables.  Snack - peanut butter crackers before bed Caffeine intake -  Salt intake -   What exercise is being done to improve your Blood Pressure Control?  Patient will walk in her house as much as she can.   Adherence Review: Is the patient currently on ACE/ARB medication? No Does the patient have >5 day gap between last estimated fill dates? No  Care Gaps: AWV - completed 10/03/2022 Last eye exam - 11/20/2021 Last foot exam - 09/24/2022 Last BP - 124/60 on 10/29/2022 Last A1C - 5.7 on 07/07/2022 Urine ACR - overdue Tdap - overdue Covid - overdue Shingrix  - postponed   Star Rating Drug: Glipizide 10 mg - last filled 102/26/2024 90 DS [facility] Trulicity 1.5 mg/0.5 ml - PAP   Chart  Updates: Recent office visits:  10/29/2022 Abbe Amsterdam MD - Patient was see for Irritable bowel syndrome with both constipation and diarrhea and additional concerns. Started Semaglutide 0.5 mg weekly. Discontinued Dulaglutide, Furosemide and pantoprazole.   07/07/2022 Abbe Amsterdam MD - Patient was seen for Type 2 diabetes mellitus with other diabetic kidney complication, without long-term current use of insulin and additional concerns. No mediation changes.   Recent consult visits:  09/24/2022 Geralynn Rile DPM - Patient was seen for Pain due to onychomycosis of toenails of both feet and additional concerns. No medication changes.   08/07/2022 Waverly Ferrari MD - Patient was seen for Atherosclerosis of artery of extremity with intermittent claudication. No medication changes.   08/01/2022 Abbe Amsterdam MD - Patient was seen for subacute cough and an additional concerns. Started Zpak and Furosemide 20 mg daily.   07/09/2022 Sallye Lat (ophthalmology) - Patient was seen for central retinal artery occlusion, left eye. No additional chart notes.   Hospital visits:  None  Medications: Outpatient Encounter Medications as of 12/08/2022  Medication Sig   aspirin EC 81 MG tablet Take 81 mg by mouth daily. Swallow whole.   atenolol (TENORMIN) 100 MG tablet TAKE 1 TABLET BY MOUTH TWICE A DAY   blood glucose meter kit and supplies KIT Dispense based on patient and insurance preference. Use up to four times daily as directed.   Blood Glucose Monitoring Suppl (ONETOUCH ULTRALINK) w/Device KIT Test twice daily (onetouch ultra)   Blood Pressure Monitoring (BLOOD PRESSURE CUFF) MISC Use daily to  monitor blood pressue   Continuous Blood Gluc Receiver (DEXCOM G7 RECEIVER) DEVI 1 Device by Does not apply route as directed.   Continuous Blood Gluc Sensor (DEXCOM G7 SENSOR) MISC 1 Device by Does not apply route every 21 ( twenty-one) days.   ferrous gluconate (FERGON) 324 MG tablet Take 324 mg  by mouth daily with breakfast.   glipiZIDE (GLUCOTROL XL) 10 MG 24 hr tablet TAKE 1 TABLET BY MOUTH TWICE A DAY   Lancets (ONETOUCH ULTRASOFT) lancets Use as instructed   Multiple Vitamin (MULTIVITAMIN) tablet Take 1 tablet by mouth daily.   naproxen sodium (ALEVE) 220 MG tablet Take 220 mg by mouth daily as needed.   prednisoLONE acetate (PRED FORTE) 1 % ophthalmic suspension    Semaglutide,0.25 or 0.5MG /DOS, 2 MG/1.5ML SOPN Inject 0.5 mg into the skin once a week.   No facility-administered encounter medications on file as of 12/08/2022.  Fill History:   Dispensed Days Supply Quantity Provider Pharmacy  ATENOLOL 100 MG TABLET 11/24/2022 90 180 each      Dispensed Days Supply Quantity Provider Pharmacy  FERROUS GLUCONATE 324 MG TAB 12/10/2021 30 30 each      Dispensed Days Supply Quantity Provider Pharmacy  GLIPIZIDE ER 10 MG TABLET 09/22/2022 90 180 each      Dispensed Days Supply Quantity Provider Pharmacy  OZEMPIC 0.25-0.5 MG/DOSE PEN 11/22/2022 28 3 mL     Recent Office Vitals: BP Readings from Last 3 Encounters:  10/29/22 124/60  09/24/22 (!) 155/67  08/07/22 (!) 145/80   Pulse Readings from Last 3 Encounters:  10/29/22 84  08/07/22 80  08/01/22 80    Wt Readings from Last 3 Encounters:  10/29/22 149 lb (67.6 kg)  10/03/22 149 lb (67.6 kg)  08/07/22 149 lb (67.6 kg)     Kidney Function Lab Results  Component Value Date/Time   CREATININE 1.19 (H) 12/16/2021 12:55 PM   CREATININE 1.20 (H) 11/29/2021 10:02 AM   CREATININE 1.11 10/25/2021 12:36 PM   CREATININE 0.91 08/07/2017 03:45 PM   CREATININE 0.85 07/11/2016 03:33 PM   GFR 48.80 (L) 10/25/2021 12:36 PM   GFRNONAA 48 (L) 12/16/2021 12:55 PM   GFRAA >60 11/16/2017 06:00 PM       Latest Ref Rng & Units 12/16/2021   12:55 PM 11/29/2021   10:02 AM 10/25/2021   12:36 PM  BMP  Glucose 70 - 99 mg/dL 409  811  914   BUN 8 - 23 mg/dL 11  8  15    Creatinine 0.44 - 1.00 mg/dL 7.82  9.56  2.13   Sodium 135 - 145  mmol/L 138  138  138   Potassium 3.5 - 5.1 mmol/L 4.1  4.1  4.1   Chloride 98 - 111 mmol/L 105  105  100   CO2 22 - 32 mmol/L 29   30   Calcium 8.9 - 10.3 mg/dL 9.4   08.6    In[facility]  Clinical Pharmacist Assistant [phone]

## 2022-12-09 ENCOUNTER — Ambulatory Visit: Payer: Medicare HMO | Admitting: Podiatry

## 2022-12-09 DIAGNOSIS — M79674 Pain in right toe(s): Secondary | ICD-10-CM

## 2022-12-09 DIAGNOSIS — E1140 Type 2 diabetes mellitus with diabetic neuropathy, unspecified: Secondary | ICD-10-CM | POA: Diagnosis not present

## 2022-12-09 DIAGNOSIS — B351 Tinea unguium: Secondary | ICD-10-CM

## 2022-12-09 DIAGNOSIS — K7460 Unspecified cirrhosis of liver: Secondary | ICD-10-CM | POA: Diagnosis not present

## 2022-12-09 DIAGNOSIS — L84 Corns and callosities: Secondary | ICD-10-CM | POA: Diagnosis not present

## 2022-12-09 DIAGNOSIS — M79675 Pain in left toe(s): Secondary | ICD-10-CM

## 2022-12-09 DIAGNOSIS — E1149 Type 2 diabetes mellitus with other diabetic neurological complication: Secondary | ICD-10-CM | POA: Diagnosis not present

## 2022-12-09 NOTE — Progress Notes (Signed)
  Subjective:  Patient ID: Anita Mccormick, female    DOB: Sep 17, 1945,  MRN: [id number]  Anita Mccormick presents to clinic today for at risk foot care with history of diabetic neuropathy and corn(s) both feet, callus(es) right foot and painful mycotic nails.  Pain interferes with ambulation. Aggravating factors include wearing enclosed shoe gear. Painful toenails interfere with ambulation. Aggravating factors include wearing enclosed shoe gear. Pain is relieved with periodic professional debridement. Painful corns and calluses are aggravated when weightbearing with and without shoegear. Pain is relieved with periodic professional debridement.  Chief Complaint  Patient presents with   Nail Problem    [facility] BS-didn't check A1C-7.0 PCP-[facility] PCP VST-09/2022   New problem(s): None.   PCP is Deeann Saint, MD.  Allergies  Allergen Reactions   Ace Inhibitors Cough    ????   Bee Venom Swelling   Chlorthalidone     REACTION: unspecified   Metformin     REACTION: gi side effects   Penicillins     REACTION: urticaria (hives)   Sulfamethoxazole     REACTION: questionable    Review of Systems: Negative except as noted in the HPI.  Objective: No changes noted in today's physical examination. There were no vitals filed for this visit. Anita Mccormick is a pleasant 77 y.o. female WD, WN in NAD. AAO x 3.  Vascular Examination: CFT <3 seconds b/l. DP/PT pulses faintly palpable b/l. Skin temperature gradient warm to warm b/l. No pain with calf compression. No ischemia or gangrene. No cyanosis or clubbing noted b/l.    Neurological Examination: Sensation grossly intact b/l with 10 gram monofilament. Vibratory sensation intact b/l. Pt has subjective symptoms of neuropathy.  Dermatological Examination: Pedal skin thin, shiny and atrophic b/l.  No open wounds. No interdigital macerations.  Toenails 1-5 b/l thick, discolored, elongated with subungual debris and pain on dorsal palpation.     Hyperkeratotic lesion(s) right heel and bilateral 5th toes.  No erythema, no edema, no drainage, no fluctuance.  Musculoskeletal Examination: Muscle strength 5/5 to b/l LE. Adductovarus deformity bilateral 5th toes.  Radiographs: None  Assessment/Plan: 1. Pain due to onychomycosis of toenails of both feet   2. Corns and callosities   3. Type II diabetes mellitus with neurological manifestations [facility])     -Consent given for treatment as described below: -Examined patient. -Mycotic toenails 1-5 bilaterally were debrided in length and girth with sterile nail nippers and dremel without incident. -Corn(s) bilateral 5th toes pared utilizing sterile scalpel blade without complication or incident. Total number debrided=2. -Callus(es) right heel pared utilizing sterile scalpel blade without complication or incident. Total number debrided =1. -Patient/POA to call should there be question/concern in the interim.   Return in about 3 months (around 03/11/2023).  Freddie Breech, DPM

## 2022-12-12 ENCOUNTER — Encounter: Payer: Self-pay | Admitting: Podiatry

## 2022-12-16 ENCOUNTER — Other Ambulatory Visit: Payer: Self-pay

## 2022-12-16 DIAGNOSIS — D696 Thrombocytopenia, unspecified: Secondary | ICD-10-CM

## 2022-12-17 ENCOUNTER — Inpatient Hospital Stay: Payer: Medicare HMO | Attending: Hematology | Admitting: Hematology

## 2022-12-17 ENCOUNTER — Inpatient Hospital Stay: Payer: Medicare HMO

## 2023-01-20 ENCOUNTER — Ambulatory Visit: Payer: Medicare HMO | Admitting: Podiatry

## 2023-02-09 ENCOUNTER — Telehealth: Payer: Self-pay | Admitting: Family Medicine

## 2023-02-09 NOTE — Telephone Encounter (Signed)
Can you please call and see what is going on?  Has pt taking home COVID test, etc.  Patient would only need to arrive 15 minutes prior to appointment for testing.

## 2023-02-09 NOTE — Telephone Encounter (Signed)
Pt's Spouse called to say Pt has been experiencing congestion, cough, headache x 2-3 days.  Pt was offered an OV, and was informed they would have to arrive 45 minutes prior to appointment to be tested.  Spouse stated Pt is too sick to come in. Pt was then offered a VV, but again they declined, stating they just want something sent to the pharmacy.

## 2023-02-10 NOTE — Telephone Encounter (Signed)
Pt states due to her vision she will not be able to read covid test

## 2023-02-10 NOTE — Telephone Encounter (Signed)
Spoke with pt, will come in tomorrow for testing and visit.

## 2023-02-10 NOTE — Telephone Encounter (Signed)
Spoke with pt, does have home test. Will call back with results.

## 2023-02-11 ENCOUNTER — Telehealth (INDEPENDENT_AMBULATORY_CARE_PROVIDER_SITE_OTHER): Payer: Medicare HMO | Admitting: Family Medicine

## 2023-02-11 ENCOUNTER — Ambulatory Visit (INDEPENDENT_AMBULATORY_CARE_PROVIDER_SITE_OTHER): Payer: Medicare HMO

## 2023-02-11 ENCOUNTER — Encounter: Payer: Self-pay | Admitting: Family Medicine

## 2023-02-11 DIAGNOSIS — E1129 Type 2 diabetes mellitus with other diabetic kidney complication: Secondary | ICD-10-CM | POA: Diagnosis not present

## 2023-02-11 DIAGNOSIS — E538 Deficiency of other specified B group vitamins: Secondary | ICD-10-CM

## 2023-02-11 DIAGNOSIS — U071 COVID-19: Secondary | ICD-10-CM | POA: Diagnosis not present

## 2023-02-11 DIAGNOSIS — R6889 Other general symptoms and signs: Secondary | ICD-10-CM | POA: Diagnosis not present

## 2023-02-11 DIAGNOSIS — R0602 Shortness of breath: Secondary | ICD-10-CM | POA: Diagnosis not present

## 2023-02-11 LAB — POC COVID19 BINAXNOW: SARS Coronavirus 2 Ag: POSITIVE — AB

## 2023-02-11 MED ORDER — MOLNUPIRAVIR EUA 200MG CAPSULE: 4.0000 | ORAL_CAPSULE | Freq: Two times a day (BID) | ORAL | 0 refills | Status: AC

## 2023-02-11 NOTE — Progress Notes (Signed)
Virtual Visit via Telephone Note  I connected with Anita Mccormick on 02/11/23 at  2:00 PM EDT by telephone and verified that I am speaking with the correct person using two identifiers.  Visit initially in person but switched to telephone as patient was unable to use virtual application on phone.   I discussed the limitations, risks, security and privacy concerns of performing an evaluation and management service by telephone and the availability of in person appointments. I also discussed with the patient that there may be a patient responsible charge related to this service. The patient expressed understanding and agreed to proceed.  Location patient:car Location provider: work or home office Participants present for the call: patient, provider Patient did not have a visit in the prior 7 days to address this/these issue(s). Chief Complaint  Patient presents with   Covid Positive    Tested positive, symptoms started Friday or Saturday. Started nausea started Sunday and cough got worse. Has taken taken robitussin DM for cough. Not able to eat a lot or drink much water cause her to get sick     History of Present Illness: Pt is a 77 year old female seen for acute concern.  Patient started feeling sick on last Friday or Saturday (4-5 days ago).  Patient endorses nausea, chills, productive cough, HAs-worse in afternoons, irritated throat, and SOB.  Denies fever, loose stools.  Tried Tussin.  Still taking Januvia.  Not able to eat or drink much.  Patient had a positive COVID test today.  Patient inquires if someone can show her how to use CGM sensor.  Observations/Objective: Patient sounds cheerful and well on the phone. I do not appreciate any SOB. Speech and thought processing are grossly intact. Patient reported vitals:  Assessment and Plan: COVID-19 virus infection - Plan: molnupiravir EUA (LAGEVRIO) 200 mg CAPS capsule  Type 2 diabetes mellitus with other diabetic kidney complication,  without long-term current use of insulin (HCC)  SOB (shortness of breath) Patient with acute viral URI symptoms starting 4-5 days ago with positive COVID-19 test today at clinic.  Discussed r/b/a of antiviral medications.  Patient wishes to start.  Prescription for molnupiravir sent to pharmacy.  Continue supportive care with OTC cough/cold medications.  Given strict precautions for continued or worsening symptoms.  Follow Up Instructions:  F/u prn   [id number] 5-10 [id number] 11-20 9443 21-30 I did not refer this patient for an OV in the next 24 hours for this/these issue(s).  I discussed the assessment and treatment plan with the patient. The patient was provided an opportunity to ask questions and all were answered. The patient agreed with the plan and demonstrated an understanding of the instructions.   The patient was advised to call back or seek an in-person evaluation if the symptoms worsen or if the condition fails to improve as anticipated.  I provided 12:22 minutes of non-face-to-face time during this encounter.   Deeann Saint, MD

## 2023-03-02 ENCOUNTER — Telehealth: Payer: Self-pay | Admitting: Family Medicine

## 2023-03-02 NOTE — Telephone Encounter (Signed)
Pt called again to say she is very worried and she is very uncomfortable and needs a call back asap, to discuss.

## 2023-03-02 NOTE — Telephone Encounter (Signed)
Pt would like to have some show her had to read and use dexcom is it ok to use visit type nurse visit

## 2023-03-03 ENCOUNTER — Ambulatory Visit: Payer: Medicare HMO

## 2023-03-06 ENCOUNTER — Ambulatory Visit: Payer: Medicare HMO

## 2023-03-13 ENCOUNTER — Other Ambulatory Visit: Payer: Self-pay | Admitting: Family Medicine

## 2023-03-16 ENCOUNTER — Ambulatory Visit (INDEPENDENT_AMBULATORY_CARE_PROVIDER_SITE_OTHER): Payer: Medicare HMO

## 2023-03-16 ENCOUNTER — Ambulatory Visit: Payer: Medicare HMO | Admitting: Podiatry

## 2023-03-16 DIAGNOSIS — E1151 Type 2 diabetes mellitus with diabetic peripheral angiopathy without gangrene: Secondary | ICD-10-CM | POA: Diagnosis not present

## 2023-03-16 DIAGNOSIS — M79674 Pain in right toe(s): Secondary | ICD-10-CM

## 2023-03-16 DIAGNOSIS — L84 Corns and callosities: Secondary | ICD-10-CM

## 2023-03-16 DIAGNOSIS — E1129 Type 2 diabetes mellitus with other diabetic kidney complication: Secondary | ICD-10-CM

## 2023-03-16 DIAGNOSIS — M79675 Pain in left toe(s): Secondary | ICD-10-CM

## 2023-03-16 DIAGNOSIS — B351 Tinea unguium: Secondary | ICD-10-CM | POA: Diagnosis not present

## 2023-03-16 NOTE — Progress Notes (Unsigned)
Pt came in to have CGM removed and new one placed on arm. Pt tolerated well, states she will return in 10 days.

## 2023-03-16 NOTE — Progress Notes (Signed)
Pt came in for removal of dexcom 7 sensor and placement of new one. Pt tolerated well, stated she will return for next placement.

## 2023-03-16 NOTE — Progress Notes (Unsigned)
Subjective:  Patient ID: Anita Mccormick, female    DOB: Sep 08, 1945,  MRN: [id number]  Anita Mccormick presents to clinic today for:  Chief Complaint  Patient presents with   Nail Problem    Anita Mccormick last time she took it.  A1C- 07/07/22-  5.7 PCP-Dr. Abbe Amsterdam, MD PCP VST-10/29/2022  Here for routine diabetic foot and nail care. Reports numbness with tingling.    Painful callus fifth toes beside nails bilateral, callus medial left heel.    . Patient notes nails are thick and elongated, causing pain in shoe gear when ambulating.  Patient notes that she gets to a nail salon to get her pedicure performed after her nails are trimmed here.  That she has corns on the fifth toes bilateral as well as a callus on the right great toe and right heel.  She is somewhat particular about her care today.  PCP is Anita Saint, MD. she had a televisit with her PCP on 02/11/2023.  Past Medical History:  Diagnosis Date   Arthritis    Cancer (HCC)    breast left   Cerebrovascular accident [facility])    October 29 2021- Left eye- blind   COLONIC POLYPS, HX OF 12/10/2006   Qualifier: Diagnosis of  By: Anita Mulligan MD, Anita Mccormick     Diabetes mellitus type II, controlled (HCC) 12/10/2006   Poor control on Januvia 100mg  and glipizide 10mg  XL Lab Results  Component Value Date   HGBA1C 8.3* 11/02/2014        Eczema    Fatty liver disease, nonalcoholic 07/14/2014   Per Dr. Cato Mccormick Lab Results  Component Value Date   ALT 31 11/02/2014   AST 57* 11/02/2014   ALKPHOS 104 11/02/2014   BILITOT 1.1 11/02/2014       GERD (gastroesophageal reflux disease)    History of blood transfusion    History of kidney stones 2023   11/28/21 small stone currently, taking flomax   Hypertension     Allergies  Allergen Reactions   Ace Inhibitors Cough    ????   Bee Venom Swelling   Chlorthalidone     REACTION: unspecified   Metformin     REACTION: gi side effects   Penicillins     REACTION: urticaria (hives)    Sulfamethoxazole     REACTION: questionable    Review of Systems: Negative except as noted in the HPI.  Objective:  There were no vitals filed for this visit.  Anita Mccormick is a pleasant 77 y.o. female in NAD. AAO x 3.  Vascular Examination: Patient has palpable DP pulse, absent PT pulse bilateral.  Delayed capillary refill bilateral toes.  Sparse digital hair bilateral.  Proximal to distal cooling WNL bilateral.    Dermatological Examination: Interspaces are clear with no open lesions noted bilateral.  Skin is shiny and atrophic bilateral.  Nails are 3-31mm thick, with yellowish/brown discoloration, subungual debris and distal onycholysis x10.  There is pain with compression of nails x10.  There are hyperkeratotic lesions noted on the fifth toe PIPJ bilateral, right hallux plantar medial IPJ, and right plantar medial heel.     Latest Ref Rng & Units 07/07/2022   11:51 AM 03/19/2022   10:46 AM  Hemoglobin A1C  Hemoglobin-A1c 4.0 - 5.6 % 5.7  5.9    Patient qualifies for at-risk foot care because of diabetes with PVD.  Assessment/Plan: 1. Dermatophytosis of nail   2. Type II diabetes mellitus with peripheral circulatory disorder (  HCC)   3. Callus of foot     Mycotic nails x10 were sharply debrided with sterile nail nippers and power debriding burr to decrease bulk and length.  Hyperkeratotic lesions x 4 were shaved with #312 blade.  Return in about 3 months (around 06/16/2023) for [facility].   Anita Mccormick, DPM, FACFAS [facility]     [address] [place], [place] [address]                Office [phone]  Fax [phone]

## 2023-03-23 ENCOUNTER — Encounter (INDEPENDENT_AMBULATORY_CARE_PROVIDER_SITE_OTHER): Payer: Medicare HMO | Admitting: Ophthalmology

## 2023-03-23 DIAGNOSIS — H35033 Hypertensive retinopathy, bilateral: Secondary | ICD-10-CM

## 2023-03-23 DIAGNOSIS — I10 Essential (primary) hypertension: Secondary | ICD-10-CM

## 2023-03-23 DIAGNOSIS — H43813 Vitreous degeneration, bilateral: Secondary | ICD-10-CM | POA: Diagnosis not present

## 2023-03-23 DIAGNOSIS — H3412 Central retinal artery occlusion, left eye: Secondary | ICD-10-CM

## 2023-03-26 ENCOUNTER — Ambulatory Visit (INDEPENDENT_AMBULATORY_CARE_PROVIDER_SITE_OTHER): Payer: Medicare HMO

## 2023-03-26 DIAGNOSIS — Z7985 Long-term (current) use of injectable non-insulin antidiabetic drugs: Secondary | ICD-10-CM | POA: Diagnosis not present

## 2023-03-26 DIAGNOSIS — Z7984 Long term (current) use of oral hypoglycemic drugs: Secondary | ICD-10-CM

## 2023-03-26 DIAGNOSIS — E1129 Type 2 diabetes mellitus with other diabetic kidney complication: Secondary | ICD-10-CM | POA: Diagnosis not present

## 2023-03-26 NOTE — Progress Notes (Signed)
 Pt came in for removal of dexcom 7 sensor and placement of new one. Pt tolerated well, stated she will return for next placement.

## 2023-04-06 ENCOUNTER — Ambulatory Visit (INDEPENDENT_AMBULATORY_CARE_PROVIDER_SITE_OTHER): Payer: Medicare HMO

## 2023-04-06 DIAGNOSIS — E1129 Type 2 diabetes mellitus with other diabetic kidney complication: Secondary | ICD-10-CM

## 2023-04-06 DIAGNOSIS — D509 Iron deficiency anemia, unspecified: Secondary | ICD-10-CM

## 2023-04-06 LAB — GLUCOSE, POCT (MANUAL RESULT ENTRY): POC Glucose: 235 mg/dL — AB (ref 70–99)

## 2023-04-06 NOTE — Progress Notes (Addendum)
Pt came in for dexcom sensor replacement and request to check her BG to see where she is at.   She states she had oatmeal, bacon and fresh apple.

## 2023-04-10 IMAGING — CT CT ANGIO HEAD-NECK (W OR W/O PERF)
2 of 11 series · 7 of 33 positions shown · non-contrast
Comparison: Brain MRI 10/19/2007

CLINICAL DATA: Central retinal artery occlusion both eyes last
week.

EXAM:
CT ANGIOGRAPHY HEAD AND NECK
TECHNIQUE: Multidetector CT imaging of the head and neck was performed using
the standard protocol during bolus administration of intravenous
contrast. Multiplanar CT image reconstructions and MIPs were
obtained to evaluate the vascular anatomy. Carotid stenosis
measurements (when applicable) are obtained utilizing NASCET
criteria, using the distal internal carotid diameter as the
denominator.

[Series 8: cta head · axial · 0.54mm/px · z∈[-191,-89]mm · 2 of 155 slices shown]
[im 52/155  soft-tissue]
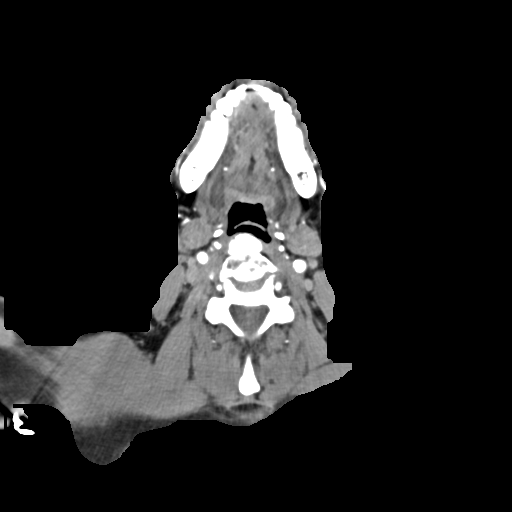
[im 103/155  soft-tissue]
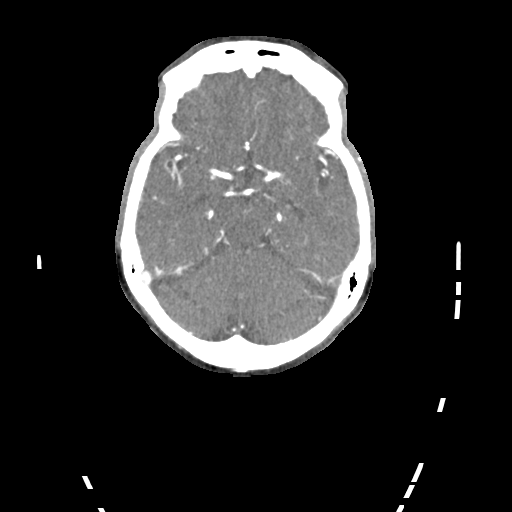

[Series 10: ax thin · axial · 0.43mm/px · z∈[-243,-39]mm · 5 of 306 slices shown]
[im 51/306  soft-tissue]
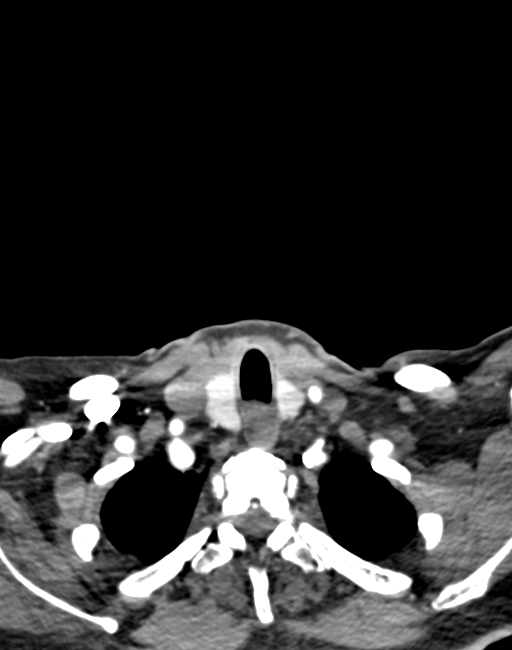
[im 102/306  bone]
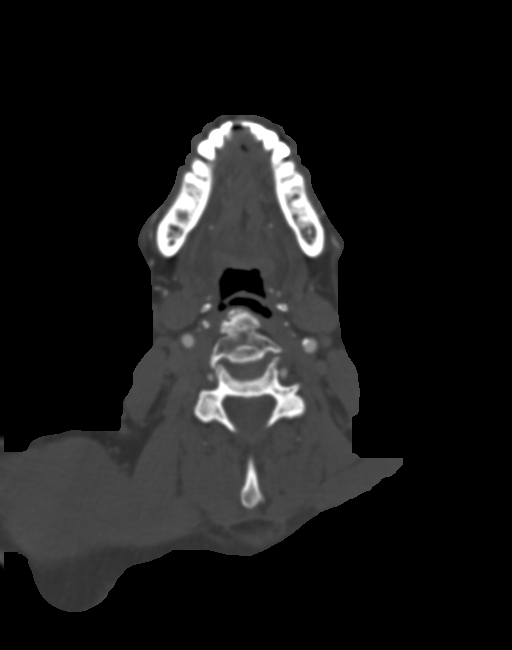
[im 153/306  soft-tissue]
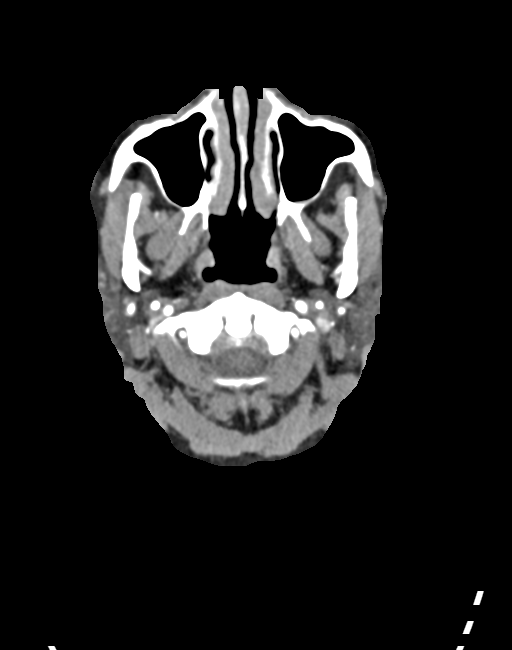
[im 204/306  bone]
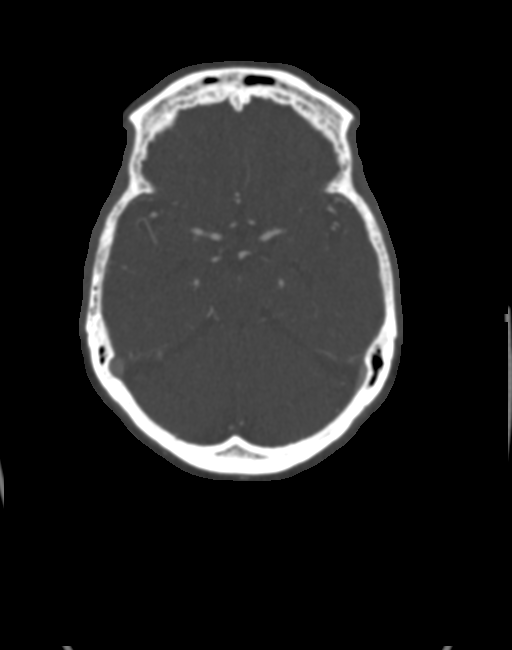
[im 255/306  soft-tissue]
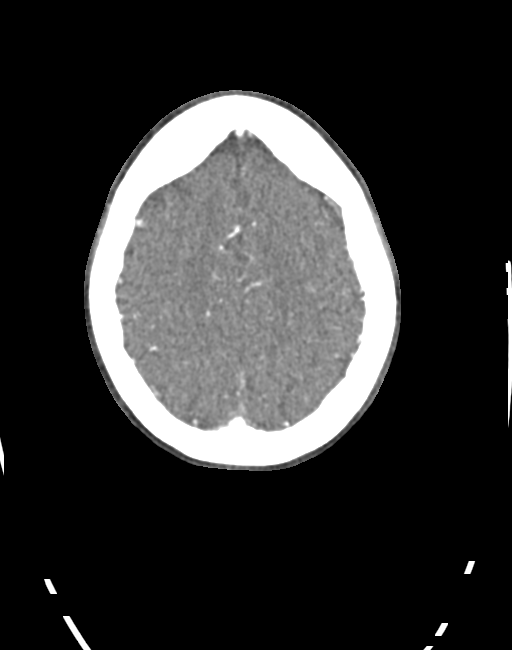

[7 of 33 positions shown; findings below may reference images not displayed]

RADIATION DOSE REDUCTION: This exam was performed according to the
departmental dose-optimization program which includes automated
exposure control, adjustment of the mA and/or kV according to
patient size and/or use of iterative reconstruction technique.

CONTRAST:  60mL OMNIPAQUE IOHEXOL 350 MG/ML SOLN
FINDINGS: CT HEAD FINDINGS

Brain: There is no acute intracranial hemorrhage, extra-axial fluid
collection, or acute infarct.

Background parenchymal volume is normal for age. The ventricles are
normal in size. Gray-white differentiation is preserved. Patchy
hypodensity in the subcortical and periventricular white matter
likely reflects sequela of chronic white matter microangiopathy.
There is a probable small remote lacunar infarct in the left frontal
lobe subcortical white matter (4-11).

There is no solid mass lesion. There is no mass effect or midline
shift.

Vascular: See below.

Skull: Normal. Negative for fracture or focal lesion.

Sinuses and orbits: The paranasal sinuses are clear. Bilateral lens
implants are in place. The globes and orbits are otherwise
unremarkable.

Other: None.

Review of the MIP images confirms the above findings

CTA NECK FINDINGS

Aortic arch: There is calcified atherosclerotic plaque of the aortic
arch. The origins of the major branch vessels are patent. The
subclavian arteries are patent to the level imaged.

Right carotid system: The right common carotid artery is patent with
scattered calcified plaque but no hemodynamically significant
stenosis. There is calcified plaque in the proximal right internal
carotid artery without hemodynamically significant stenosis or
occlusion. The right internal carotid artery is tortuous in the neck
which can be seen with underlying hypertension. The right external
carotid artery is patent. There is no evidence of dissection or
aneurysm.

Left carotid system: The left common carotid artery is patent with
scattered calcified plaque but no hemodynamically significant
stenosis. There is calcified plaque in the proximal left internal
carotid artery resulting in up to approximately 30-40% stenosis. The
distal left internal carotid artery is patent. The left external
carotid artery is patent. There is no evidence of dissection or
aneurysm.

Vertebral arteries: There is mild plaque at the origins of the
vertebral arteries without significant stenosis. Otherwise, the
vertebral arteries are patent, without hemodynamically significant
stenosis or occlusion. There is no evidence of dissection or
aneurysm.

Skeleton: There is multilevel degenerative change of the cervical
spine with bulky anterior osteophytes throughout. There is no acute
osseous abnormality or aggressive osseous lesion. There is no
visible canal hematoma.

Other neck: The soft tissues are unremarkable.

Upper chest: The imaged lung apices are clear.

Review of the MIP images confirms the above findings

CTA HEAD FINDINGS

Anterior circulation: There is dense calcification of the
intracranial ICAs resulting in moderate stenosis of the supraclinoid
segments bilaterally. The ophthalmic arteries are identified
bilaterally.

The bilateral MCAs are patent

The bilateral ACAs are patent the anterior communicating artery is
not definitely seen.

There is no aneurysm or AVM.

Posterior circulation: The bilateral V4 segments are patent. The
PICA origin is seen on the right but not definitely seen on the
left. There is mild plaque in the left V4 segment without
hemodynamically significant stenosis. The basilar artery is patent

The bilateral PCAs are patent. There is a fetal origin of the left
PCA. The right posterior communicating artery is not definitely
seen.

There is no aneurysm or AVM.

Venous sinuses: Patent.

Anatomic variants: As above.

Review of the MIP images confirms the above findings
IMPRESSION: 1. No evidence of acute intracranial pathology. Probable small
remote lacunar infarct in the left frontal lobe subcortical white
matter on a background of chronic white matter microangiopathy.
2. Calcified atherosclerotic plaque at the bilateral carotid
bifurcations resulting in approximately 30-40% stenosis on the left
and no hemodynamically significant stenosis on the right. Mild
plaque at the origins of the vertebral arteries without
hemodynamically significant stenosis.
3. Dense calcification of the intracranial ICAs resulting in
moderate stenosis of the supraclinoid segments bilaterally. The
ophthalmic arteries are identified bilaterally.
4. Otherwise, patent intracranial vasculature without
hemodynamically significant stenosis or occlusion.

Aortic Atherosclerosis (OYNBN-V60.0).

## 2023-04-16 ENCOUNTER — Ambulatory Visit: Payer: Medicare HMO

## 2023-04-16 NOTE — Progress Notes (Signed)
Pt came in for removal of dexcom 7 sensor and placement of new one. Pt tolerated well, stated she will return for next placement.

## 2023-04-23 ENCOUNTER — Encounter: Payer: Self-pay | Admitting: Family Medicine

## 2023-04-23 ENCOUNTER — Ambulatory Visit: Payer: Medicare HMO | Admitting: Family Medicine

## 2023-04-23 ENCOUNTER — Telehealth: Payer: Self-pay | Admitting: Family Medicine

## 2023-04-23 ENCOUNTER — Telehealth: Payer: Self-pay

## 2023-04-23 VITALS — BP 126/74 | HR 74 | Temp 98.6°F | Ht 62.0 in | Wt 151.6 lb

## 2023-04-23 DIAGNOSIS — K5900 Constipation, unspecified: Secondary | ICD-10-CM

## 2023-04-23 DIAGNOSIS — Z7985 Long-term (current) use of injectable non-insulin antidiabetic drugs: Secondary | ICD-10-CM

## 2023-04-23 DIAGNOSIS — D509 Iron deficiency anemia, unspecified: Secondary | ICD-10-CM | POA: Diagnosis not present

## 2023-04-23 DIAGNOSIS — E162 Hypoglycemia, unspecified: Secondary | ICD-10-CM

## 2023-04-23 DIAGNOSIS — E1129 Type 2 diabetes mellitus with other diabetic kidney complication: Secondary | ICD-10-CM

## 2023-04-23 DIAGNOSIS — L309 Dermatitis, unspecified: Secondary | ICD-10-CM | POA: Diagnosis not present

## 2023-04-23 LAB — IBC + FERRITIN
Ferritin: 9.1 ng/mL — ABNORMAL LOW (ref 10.0–291.0)
Iron: 29 ug/dL — ABNORMAL LOW (ref 42–145)
Saturation Ratios: 5 % — ABNORMAL LOW (ref 20.0–50.0)
TIBC: 576.8 ug/dL — ABNORMAL HIGH (ref 250.0–450.0)
Transferrin: 412 mg/dL — ABNORMAL HIGH (ref 212.0–360.0)

## 2023-04-23 LAB — COMPREHENSIVE METABOLIC PANEL
ALT: 11 U/L (ref 0–35)
AST: 23 U/L (ref 0–37)
Albumin: 3.9 g/dL (ref 3.5–5.2)
Alkaline Phosphatase: 96 U/L (ref 39–117)
BUN: 13 mg/dL (ref 6–23)
CO2: 26 mEq/L (ref 19–32)
Calcium: 9.4 mg/dL (ref 8.4–10.5)
Chloride: 107 mEq/L (ref 96–112)
Creatinine, Ser: 1.15 mg/dL (ref 0.40–1.20)
GFR: 46.28 mL/min — ABNORMAL LOW (ref >60.00)
Glucose, Bld: 209 mg/dL — ABNORMAL HIGH (ref 70–99)
Potassium: 4.5 mEq/L (ref 3.5–5.1)
Sodium: 140 mEq/L (ref 135–145)
Total Bilirubin: 0.8 mg/dL (ref 0.2–1.2)
Total Protein: 7.2 g/dL (ref 6.0–8.3)

## 2023-04-23 LAB — CBC WITH DIFFERENTIAL/PLATELET
Basophils Absolute: 0.1 10*3/uL (ref 0.0–0.1)
Basophils Relative: 1.4 % (ref 0.0–3.0)
Eosinophils Absolute: 1.9 10*3/uL — ABNORMAL HIGH (ref 0.0–0.7)
Eosinophils Relative: 25.4 % — ABNORMAL HIGH (ref 0.0–5.0)
HCT: 26.9 % — ABNORMAL LOW (ref 36.0–46.0)
Hemoglobin: 7.7 g/dL — CL (ref 12.0–15.0)
Lymphocytes Relative: 12.1 % (ref 12.0–46.0)
Lymphs Abs: 0.9 10*3/uL (ref 0.7–4.0)
MCHC: 28.6 g/dL — ABNORMAL LOW (ref 30.0–36.0)
MCV: 80.1 fl (ref 78.0–100.0)
Monocytes Absolute: 0.6 10*3/uL (ref 0.1–1.0)
Monocytes Relative: 7.8 % (ref 3.0–12.0)
Neutro Abs: 3.9 10*3/uL (ref 1.4–7.7)
Neutrophils Relative %: 53.3 % (ref 43.0–77.0)
Platelets: 121 10*3/uL — ABNORMAL LOW (ref 150.0–400.0)
RBC: 3.35 Mil/uL — ABNORMAL LOW (ref 3.87–5.11)
RDW: 19.2 % — ABNORMAL HIGH (ref 11.5–15.5)
WBC: 7.4 10*3/uL (ref 4.0–10.5)

## 2023-04-23 LAB — MICROALBUMIN / CREATININE URINE RATIO
Creatinine,U: 120.4 mg/dL
Microalb Creat Ratio: 0.6 mg/g (ref 0.0–30.0)
Microalb, Ur: 0.7 mg/dL (ref 0.0–1.9)

## 2023-04-23 LAB — TSH: TSH: 0.89 u[IU]/mL (ref 0.35–5.50)

## 2023-04-23 LAB — HEMOGLOBIN A1C: Hgb A1c MFr Bld: 6.4 % (ref 4.6–6.5)

## 2023-04-23 MED ORDER — GLIPIZIDE ER 10 MG PO TB24: 10.0000 mg | ORAL_TABLET | Freq: Every day | ORAL | Status: DC

## 2023-04-23 MED ORDER — FERROUS GLUCONATE 324 (38 FE) MG PO TABS: 324.0000 mg | ORAL_TABLET | Freq: Every day | ORAL | 3 refills | Status: DC

## 2023-04-23 MED ORDER — DEXCOM G7 SENSOR MISC
1.0000 | 6 refills | Status: DC
  Filled 2023-09-23 (×2): qty 3, 30d supply, fill #0
  Filled 2023-10-09 – 2023-10-16 (×2): qty 3, 30d supply, fill #1

## 2023-04-23 MED ORDER — TRULICITY 1.5 MG/0.5ML ~~LOC~~ SOAJ: 1.5000 mg | SUBCUTANEOUS | 3 refills | Status: DC

## 2023-04-23 NOTE — Telephone Encounter (Signed)
Patient contacted regarding result.  Will start iron supplement.  Will recheck iron Monday during nurses visit.

## 2023-04-23 NOTE — Telephone Encounter (Signed)
CRITICAL VALUE STICKER  CRITICAL VALUE:  Low hemoglobin - 7.7  RECEIVER (on-site recipient of call):  DATE & TIME NOTIFIED:   MESSENGER (representative from lab): shaneequah from [employer] lab   MD NOTIFIED:   TIME OF NOTIFICATION:  RESPONSE:

## 2023-04-23 NOTE — Telephone Encounter (Signed)
Pt contacted regarding critical lab value, hemoglobin 7.7 from earlier today.  Patient advised to restart iron supplements twice daily.  Will recheck hemoglobin on Monday.  Order placed.  Start MiraLAX.    Discussed iron infusion.  Patient declines at this time.  Given strict precautions for worsening symptoms.  Abbe Amsterdam, MD

## 2023-04-23 NOTE — Progress Notes (Signed)
Established Patient Office Visit   Subjective  Patient ID: Anita Mccormick, female    DOB: [birth year]  Age: 77 y.o. MRN: [id number]  Chief Complaint  Patient presents with   Medical Management of Chronic Issues    Kidney screening, legs are itchy     Pt is a 77 yo female seen for follow-up on chronic conditions and new concern.  Patient endorses pruritus of extremities and scalp.  Patient with history of eczema.  Using betamethasone 1% cream previously prescribed by dermatology.  Patient states the skin on her legs is becoming thinner due to the cream.  Using CeraVe moisturizer.  Taking Trulicity 1.5 mg weekly and glipizide XL 10 mg twice daily.  Notes occasional hypoglycemia late at night/first thing in the morning.  Patient's CGM alerts her to low readings. Will eat peanut butter crackers for hypoglycemia.  Patient endorses frequent urination.  Eating more ice.  Noticing more SOB after walking short distances.  Mild LE edema.  Has not had to take Lasix.  Having decreased hunger. Not eating much for dinner.  Patient notes continued bloating and constipation.  Though having near daily BMs having to strain.  Will take stool softener if needed.  Not currently taking iron supplement.    Past Medical History:  Diagnosis Date   Arthritis    Cancer (HCC)    breast left   Cerebrovascular accident [facility])    October 29 2021- Left eye- blind   COLONIC POLYPS, HX OF 12/10/2006   Qualifier: Diagnosis of  By: Cato Mulligan MD, Bruce     Diabetes mellitus type II, controlled (HCC) 12/10/2006   Poor control on Januvia 100mg  and glipizide 10mg  XL Lab Results  Component Value Date   HGBA1C 8.3* 11/02/2014        Eczema    Fatty liver disease, nonalcoholic 07/14/2014   Per Dr. Cato Mulligan Lab Results  Component Value Date   ALT 31 11/02/2014   AST 57* 11/02/2014   ALKPHOS 104 11/02/2014   BILITOT 1.1 11/02/2014       GERD (gastroesophageal reflux disease)    History of blood transfusion    History of kidney stones  2023   11/28/21 small stone currently, taking flomax   Hypertension    Past Surgical History:  Procedure Laterality Date   ARTERY BIOPSY Left 11/29/2021   Procedure: LEFT BIOPSY TEMPORAL ARTERY;  Surgeon: Chuck Hint, MD;  Location: [facility] OR;  Service: Vascular;  Laterality: Left;   CATARACT EXTRACTION Bilateral    MASTECTOMY Left    TUBAL LIGATION     Social History   Tobacco Use   Smoking status: Some Days    Current packs/day: 0.20    Average packs/day: 0.2 packs/day for 10.0 years (2.0 ttl pk-yrs)    Types: Cigarettes   Smokeless tobacco: Never  Vaping Use   Vaping status: Never Used  Substance Use Topics   Alcohol use: Not Currently   Drug use: No   Family History  Problem Relation Age of Onset   Stroke Mother    Cancer Father        prostate   Diabetes Sister    Pancreatic cancer Sister    Allergies  Allergen Reactions   Ace Inhibitors Cough    ????   Bee Venom Swelling   Chlorthalidone     REACTION: unspecified   Metformin     REACTION: gi side effects   Penicillins     REACTION: urticaria (hives)   Sulfamethoxazole  REACTION: questionable      ROS Negative unless stated above    Objective:     BP 126/74 (BP Location: Right Wrist, Patient Position: Sitting, Cuff Size: Normal)   Pulse 74   Temp 98.6 F (37 C) (Oral)   Ht 5\' 2"  (1.575 m)   Wt 151 lb 9.6 oz (68.8 kg)   SpO2 96%   BMI 27.73 kg/m  BP Readings from Last 3 Encounters:  04/23/23 126/74  10/29/22 124/60  09/24/22 (!) 155/67   Wt Readings from Last 3 Encounters:  04/23/23 151 lb 9.6 oz (68.8 kg)  10/29/22 149 lb (67.6 kg)  10/03/22 149 lb (67.6 kg)      Physical Exam Constitutional:      General: She is not in acute distress.    Appearance: Normal appearance.  HENT:     Head: Normocephalic and atraumatic.     Nose: Nose normal.     Mouth/Throat:     Mouth: Mucous membranes are moist.  Cardiovascular:     Rate and Rhythm: Normal rate and regular rhythm.      Heart sounds: Normal heart sounds. No murmur heard.    No gallop.     Comments: Trace edema in bilateral Les. Pulmonary:     Effort: Pulmonary effort is normal. No respiratory distress.     Breath sounds: Normal breath sounds. No wheezing, rhonchi or rales.  Skin:    General: Skin is warm and dry.     Comments: Skin of scalp, bilateral LEs, and UEs without hair, shiny.  Several hypopigmented lesions on RLE.  Neurological:     Mental Status: She is alert and oriented to person, place, and time.      No results found for any visits on 04/23/23.    Assessment & Plan:  Type 2 diabetes mellitus with other diabetic kidney complication, without long-term current use of insulin (HCC) -Hemoglobin A1c 5.7% on 07/07/2022 -Continue Trulicity 1.5 mg weekly.  Discussed potential for gastroparesis given symptoms of bloating, constipation. .-Continue glipizide XL 10 mg in a.m. -Due to hypoglycemia will d/c p.m. dose of glipizide XL 10 mg.  If hyperglycemia in a.m. consider glipizide XL 5 mg in p.m. -     Dexcom G7 Sensor; 1 Device by Does not apply route every 21 ( twenty-one) days.  Dispense: 2 each; Refill: 11 -     Comprehensive metabolic panel -     Hemoglobin A1c -     Microalbumin / creatinine urine ratio -     glipiZIDE ER; Take 1 tablet (10 mg total) by mouth daily with breakfast. -     Trulicity 1.5 mg / 0.5 mL SOPN  Hypoglycemia -Concern glipizide XL 10 mg twice daily causing hypoglycemia in the evenings/early mornings. -Will stop evening dose of glipizide and monitor blood sugar. -Continue Trulicity 1.5 mg weekly and glipizide XL 10 mg in a.m. -     Dexcom G7 Sensor; 1 Device by Does not apply route every 21 ( twenty-one) days.  Dispense: 2 each; Refill: 11 -     Comprehensive metabolic panel -     Hemoglobin A1c  Constipation, unspecified constipation type -     CBC with Differential/Platelet -     Iron, TIBC and Ferritin Panel -     TSH -     Comprehensive metabolic  panel  Eczema, unspecified type -Discussed applying moisturizer immediately after showering and reapply throughout the day. -Use steroid cream sparingly -     CBC with  Differential/Platelet  Iron deficiency anemia, unspecified iron deficiency anemia type -Not currently on iron supplement -     CBC with Differential/Platelet -     Iron, TIBC and Ferritin Panel   Return in about 10 weeks (around 07/02/2023), or if symptoms worsen or fail to improve.   Deeann Saint, MD

## 2023-04-27 ENCOUNTER — Telehealth: Payer: Self-pay

## 2023-04-27 ENCOUNTER — Other Ambulatory Visit (INDEPENDENT_AMBULATORY_CARE_PROVIDER_SITE_OTHER): Payer: Medicare HMO

## 2023-04-27 ENCOUNTER — Ambulatory Visit (INDEPENDENT_AMBULATORY_CARE_PROVIDER_SITE_OTHER): Payer: Medicare HMO | Admitting: *Deleted

## 2023-04-27 ENCOUNTER — Other Ambulatory Visit: Payer: Self-pay | Admitting: Family Medicine

## 2023-04-27 ENCOUNTER — Encounter: Payer: Self-pay | Admitting: *Deleted

## 2023-04-27 VITALS — BP 108/64

## 2023-04-27 DIAGNOSIS — D509 Iron deficiency anemia, unspecified: Secondary | ICD-10-CM

## 2023-04-27 DIAGNOSIS — E1129 Type 2 diabetes mellitus with other diabetic kidney complication: Secondary | ICD-10-CM

## 2023-04-27 LAB — CBC WITH DIFFERENTIAL/PLATELET
Basophils Absolute: 0.1 10*3/uL (ref 0.0–0.1)
Basophils Relative: 1.4 % (ref 0.0–3.0)
Eosinophils Absolute: 1.4 10*3/uL — ABNORMAL HIGH (ref 0.0–0.7)
Eosinophils Relative: 22.1 % — ABNORMAL HIGH (ref 0.0–5.0)
HCT: 27.2 % — ABNORMAL LOW (ref 36.0–46.0)
Hemoglobin: 7.9 g/dL — CL (ref 12.0–15.0)
Lymphocytes Relative: 8.7 % — ABNORMAL LOW (ref 12.0–46.0)
Lymphs Abs: 0.6 10*3/uL — ABNORMAL LOW (ref 0.7–4.0)
MCHC: 28.8 g/dL — ABNORMAL LOW (ref 30.0–36.0)
MCV: 81.2 fL (ref 78.0–100.0)
Monocytes Absolute: 0.4 10*3/uL (ref 0.1–1.0)
Monocytes Relative: 6.4 % (ref 3.0–12.0)
Neutro Abs: 4 10*3/uL (ref 1.4–7.7)
Neutrophils Relative %: 61.4 % (ref 43.0–77.0)
Platelets: 119 10*3/uL — ABNORMAL LOW (ref 150.0–400.0)
RBC: 3.35 Mil/uL — ABNORMAL LOW (ref 3.87–5.11)
RDW: 19.6 % — ABNORMAL HIGH (ref 11.5–15.5)
WBC: 6.5 10*3/uL (ref 4.0–10.5)

## 2023-04-27 LAB — POCT GLUCOSE (DEVICE FOR HOME USE): Glucose Fasting, POC: 214 mg/dL — AB (ref 70–99)

## 2023-04-27 NOTE — Telephone Encounter (Signed)
CRITICAL VALUE STICKER  CRITICAL VALUE: Hemoglobin 7.9  RECEIVER (on-site recipient of call): Pammy Vesey G, CMA  DATE & TIME NOTIFIED: 3:00 pm  MESSENGER (representative from lab): Elijio Miles from [place] lab  MD NOTIFIED: Via epic   TIME OF NOTIFICATION: 3:02 PM  RESPONSE:  N/A

## 2023-04-27 NOTE — Progress Notes (Signed)
Patient here to place her Dexcom.

## 2023-05-01 NOTE — Telephone Encounter (Signed)
Matter addressed

## 2023-05-07 ENCOUNTER — Ambulatory Visit: Payer: Medicare HMO

## 2023-05-07 NOTE — Progress Notes (Signed)
 Pt came in for removal of dexcom 7 sensor and placement of new one. Pt tolerated well, stated she will return for next placement.

## 2023-05-08 ENCOUNTER — Encounter: Payer: Self-pay | Admitting: Family Medicine

## 2023-05-08 ENCOUNTER — Telehealth: Payer: Self-pay | Admitting: Family Medicine

## 2023-05-08 ENCOUNTER — Telehealth: Payer: Self-pay | Admitting: Pharmacy Technician

## 2023-05-08 DIAGNOSIS — D509 Iron deficiency anemia, unspecified: Secondary | ICD-10-CM

## 2023-05-08 HISTORY — DX: Iron deficiency anemia, unspecified: D50.9

## 2023-05-08 NOTE — Telephone Encounter (Signed)
Called patient and infusion clinic to have her sch

## 2023-05-08 NOTE — Telephone Encounter (Signed)
Dr. Salomon Fick, [facility] note:  Patient will be scheduled as soon as possible.  Auth Submission: NO AUTH NEEDED Site of care: Site of care: [facility] Payer: [employer] Medication & CPT/J Code(s) submitted: Venofer (Iron Sucrose) J1756 Route of submission (phone, fax, portal):  Phone # Fax # Auth type: Buy/Bill PB Units/visits requested: x5 Reference number:  Approval from: 05/08/23 to 07/28/23

## 2023-05-08 NOTE — Telephone Encounter (Signed)
Pt says an iron infusion was discussed, calling asking that the order  be sent in so that they can schedule.

## 2023-05-08 NOTE — Telephone Encounter (Signed)
Patient referred to infusion pharmacy team for ambulatory infusion of IV iron.  Insurance -[employer]  Dx code - D50.9 IV Iron Therapy - Venofer 200mg  x5 Infusion appointments - Patient will be scheduled at [facility] asap.

## 2023-05-11 ENCOUNTER — Telehealth: Payer: Self-pay

## 2023-05-11 NOTE — Telephone Encounter (Signed)
Spoke with patient first appointment is 05/20/2023 for infusion

## 2023-05-18 ENCOUNTER — Ambulatory Visit: Payer: Medicare HMO

## 2023-05-18 DIAGNOSIS — E1129 Type 2 diabetes mellitus with other diabetic kidney complication: Secondary | ICD-10-CM

## 2023-05-18 LAB — GLUCOSE, POCT (MANUAL RESULT ENTRY): POC Glucose: 187 mg/dL — AB (ref 70–99)

## 2023-05-18 NOTE — Progress Notes (Signed)
         Pt came in for a replacement of Dexcom G7. Pt tolerated well. She states she will come back when her next replacement is due.   Pt requested a sample of Dexcom G7. A sample was given to pt.     Pt requested to check her blood sugar this morning to see where she is at. She states she did eat some oatmeal this morning.

## 2023-05-20 ENCOUNTER — Ambulatory Visit: Payer: Medicare HMO

## 2023-05-20 VITALS — BP 156/72 | HR 79 | Temp 98.3°F | Resp 16 | Ht 62.0 in | Wt 148.8 lb

## 2023-05-20 DIAGNOSIS — D509 Iron deficiency anemia, unspecified: Secondary | ICD-10-CM | POA: Diagnosis not present

## 2023-05-20 MED ORDER — IRON SUCROSE 20 MG/ML IV SOLN
200.0000 mg | Freq: Once | INTRAVENOUS | Status: AC
  Administered 2023-05-20: 200 mg via INTRAVENOUS
  Filled 2023-05-20: qty 10

## 2023-05-20 MED ORDER — DIPHENHYDRAMINE HCL 25 MG PO CAPS
25.0000 mg | ORAL_CAPSULE | Freq: Once | ORAL | Status: AC
  Administered 2023-05-20: 25 mg via ORAL
  Filled 2023-05-20: qty 1

## 2023-05-20 MED ORDER — ACETAMINOPHEN 325 MG PO TABS
650.0000 mg | ORAL_TABLET | Freq: Once | ORAL | Status: AC
  Administered 2023-05-20: 650 mg via ORAL
  Filled 2023-05-20: qty 2

## 2023-05-20 NOTE — Patient Instructions (Signed)
Iron Sucrose Injection What is this medication? IRON SUCROSE (EYE ern SOO krose) treats low levels of iron (iron deficiency anemia) in people with kidney disease. Iron is a mineral that plays an important role in making red blood cells, which carry oxygen from your lungs to the rest of your body. This medicine may be used for other purposes; ask your health care provider or pharmacist if you have questions. COMMON BRAND NAME(S): Venofer What should I tell my care team before I take this medication? They need to know if you have any of these conditions: Anemia not caused by low iron levels Heart disease High levels of iron in the blood Kidney disease Liver disease An unusual or allergic reaction to iron, other medications, foods, dyes, or preservatives Pregnant or trying to get pregnant Breastfeeding How should I use this medication? This medication is for infusion into a vein. It is given in a hospital or clinic setting. Talk to your care team about the use of this medication in children. While this medication may be prescribed for children as young as 2 years for selected conditions, precautions do apply. Overdosage: If you think you have taken too much of this medicine contact a poison control center or emergency room at once. NOTE: This medicine is only for you. Do not share this medicine with others. What if I miss a dose? Keep appointments for follow-up doses. It is important not to miss your dose. Call your care team if you are unable to keep an appointment. What may interact with this medication? Do not take this medication with any of the following: Deferoxamine Dimercaprol Other iron products This medication may also interact with the following: Chloramphenicol Deferasirox This list may not describe all possible interactions. Give your health care provider a list of all the medicines, herbs, non-prescription drugs, or dietary supplements you use. Also tell them if you smoke,  drink alcohol, or use illegal drugs. Some items may interact with your medicine. What should I watch for while using this medication? Visit your care team regularly. Tell your care team if your symptoms do not start to get better or if they get worse. You may need blood work done while you are taking this medication. You may need to follow a special diet. Talk to your care team. Foods that contain iron include: whole grains/cereals, dried fruits, beans, or peas, leafy green vegetables, and organ meats (liver, kidney). What side effects may I notice from receiving this medication? Side effects that you should report to your care team as soon as possible: Allergic reactions--skin rash, itching, hives, swelling of the face, lips, tongue, or throat Low blood pressure--dizziness, feeling faint or lightheaded, blurry vision Shortness of breath Side effects that usually do not require medical attention (report to your care team if they continue or are bothersome): Flushing Headache Joint pain Muscle pain Nausea Pain, redness, or irritation at injection site This list may not describe all possible side effects. Call your doctor for medical advice about side effects. You may report side effects to FDA at 1-800-FDA-1088. Where should I keep my medication? This medication is given in a hospital or clinic. It will not be stored at home. NOTE: This sheet is a summary. It may not cover all possible information. If you have questions about this medicine, talk to your doctor, pharmacist, or health care provider.  2024 Elsevier/Gold Standard (2022-12-19 00:00:00)

## 2023-05-20 NOTE — Progress Notes (Signed)
Diagnosis: Acute Anemia  Provider:  Chilton Greathouse MD  Procedure: IV Infusion  IV Type: Peripheral, IV Location: R Forearm  Venofer (Iron Sucrose), Dose: 200 mg  Infusion Start Time: 1210  Infusion Stop Time: 1215  Post Infusion IV Care: Observation period completed and Peripheral IV Discontinued  Discharge: Condition: Good, Destination: Home . AVS Provided  Performed by:  Nat Math, RN

## 2023-05-22 ENCOUNTER — Ambulatory Visit (INDEPENDENT_AMBULATORY_CARE_PROVIDER_SITE_OTHER): Payer: Medicare HMO

## 2023-05-22 VITALS — BP 100/4 | HR 75 | Temp 98.0°F | Resp 18 | Ht 62.0 in | Wt 147.8 lb

## 2023-05-22 DIAGNOSIS — D509 Iron deficiency anemia, unspecified: Secondary | ICD-10-CM

## 2023-05-22 MED ORDER — DIPHENHYDRAMINE HCL 25 MG PO CAPS
25.0000 mg | ORAL_CAPSULE | Freq: Once | ORAL | Status: AC
  Administered 2023-05-22: 25 mg via ORAL
  Filled 2023-05-22: qty 1

## 2023-05-22 MED ORDER — IRON SUCROSE 20 MG/ML IV SOLN
200.0000 mg | Freq: Once | INTRAVENOUS | Status: AC
  Administered 2023-05-22: 200 mg via INTRAVENOUS
  Filled 2023-05-22: qty 10

## 2023-05-22 MED ORDER — ACETAMINOPHEN 325 MG PO TABS
650.0000 mg | ORAL_TABLET | Freq: Once | ORAL | Status: AC
  Administered 2023-05-22: 650 mg via ORAL
  Filled 2023-05-22: qty 2

## 2023-05-22 NOTE — Progress Notes (Signed)
Diagnosis: Acute Anemia  Provider:  Chilton Greathouse MD  Procedure:  IVP  IV Type: Peripheral, IV Location: R Forearm  Venofer (Iron Sucrose), Dose: 200 mg  Infusion Start Time: 1202  Infusion Stop Time: 1208  Post Infusion IV Care: Observation period completed and Peripheral IV Discontinued  Discharge: Condition: Good, Destination: Home . AVS Provided  Performed by:  Loney Hering, LPN

## 2023-05-25 ENCOUNTER — Ambulatory Visit (INDEPENDENT_AMBULATORY_CARE_PROVIDER_SITE_OTHER): Payer: Medicare HMO

## 2023-05-25 ENCOUNTER — Telehealth: Payer: Self-pay

## 2023-05-25 VITALS — BP 158/84 | HR 79 | Temp 98.3°F | Resp 16 | Ht 62.0 in | Wt 149.2 lb

## 2023-05-25 DIAGNOSIS — D509 Iron deficiency anemia, unspecified: Secondary | ICD-10-CM | POA: Diagnosis not present

## 2023-05-25 MED ORDER — ACETAMINOPHEN 325 MG PO TABS
650.0000 mg | ORAL_TABLET | Freq: Once | ORAL | Status: AC
  Administered 2023-05-25: 650 mg via ORAL
  Filled 2023-05-25: qty 2

## 2023-05-25 MED ORDER — IRON SUCROSE 20 MG/ML IV SOLN
200.0000 mg | Freq: Once | INTRAVENOUS | Status: AC
  Administered 2023-05-25: 200 mg via INTRAVENOUS
  Filled 2023-05-25: qty 10

## 2023-05-25 MED ORDER — DIPHENHYDRAMINE HCL 25 MG PO CAPS
25.0000 mg | ORAL_CAPSULE | Freq: Once | ORAL | Status: AC
  Administered 2023-05-25: 25 mg via ORAL
  Filled 2023-05-25: qty 1

## 2023-05-25 NOTE — Progress Notes (Signed)
Diagnosis: Acute Anemia  Provider:  Chilton Greathouse MD  Procedure: IV Infusion  IV Type: Peripheral, IV Location: R Hand  Venofer (Iron Sucrose), Dose: 200 mg  Infusion Start Time: 1215   Post Infusion IV Care: Observation period completed and Peripheral IV Discontinued  Discharge: Condition: Good, Destination: Home . AVS Declined  Performed by:  Nat Math, RN

## 2023-05-25 NOTE — Telephone Encounter (Signed)
Spoke to pt. Inform her the dates for her infusion was given to her provider's nurse, Tacey Ruiz.   Also inform her, the nurse will give her a call back to let her know when she should come back for lab recheck. Verbalized understanding.

## 2023-05-26 NOTE — Telephone Encounter (Signed)
Spoke with Demetrius Charity [facility]. " we have been transitioning venofer patients to 200 mg IV Push due to the fluid shortage as a result the [facility]. "

## 2023-05-27 ENCOUNTER — Ambulatory Visit (INDEPENDENT_AMBULATORY_CARE_PROVIDER_SITE_OTHER): Payer: Medicare HMO

## 2023-05-27 VITALS — BP 152/72 | HR 83 | Temp 98.5°F | Resp 20 | Ht 62.0 in | Wt 148.6 lb

## 2023-05-27 DIAGNOSIS — D509 Iron deficiency anemia, unspecified: Secondary | ICD-10-CM | POA: Diagnosis not present

## 2023-05-27 MED ORDER — DIPHENHYDRAMINE HCL 25 MG PO CAPS
25.0000 mg | ORAL_CAPSULE | Freq: Once | ORAL | Status: AC
  Administered 2023-05-27: 25 mg via ORAL
  Filled 2023-05-27: qty 1

## 2023-05-27 MED ORDER — ACETAMINOPHEN 325 MG PO TABS
650.0000 mg | ORAL_TABLET | Freq: Once | ORAL | Status: AC
Start: 2023-05-27 — End: 2023-05-27
  Administered 2023-05-27: 650 mg via ORAL
  Filled 2023-05-27: qty 2

## 2023-05-27 MED ORDER — IRON SUCROSE 20 MG/ML IV SOLN
200.0000 mg | Freq: Once | INTRAVENOUS | Status: AC
  Administered 2023-05-27: 200 mg via INTRAVENOUS
  Filled 2023-05-27: qty 10

## 2023-05-27 NOTE — Progress Notes (Signed)
Diagnosis: Iron Deficiency Anemia  Provider:  Chilton Greathouse MD  Procedure: IV Push  IV Type: Peripheral, IV Location: R Forearm  Venofer (Iron Sucrose), Dose: 200 mg  Post Infusion IV Care: Observation period completed and Peripheral IV Discontinued  Discharge: Condition: Good, Destination: Home . AVS Declined  Performed by:  Rico Ala, LPN

## 2023-05-28 ENCOUNTER — Ambulatory Visit: Payer: Medicare HMO | Admitting: *Deleted

## 2023-05-28 DIAGNOSIS — E1129 Type 2 diabetes mellitus with other diabetic kidney complication: Secondary | ICD-10-CM | POA: Diagnosis not present

## 2023-05-28 MED ORDER — DEXCOM G7 SENSOR MISC: 1.0000 | Status: DC

## 2023-05-28 NOTE — Progress Notes (Signed)
Patient presents for a nurses visit to remove and replace the Dexcom G7 sensor.  Patient did not have her own sensor and requested a sample.  PCP was informed, approved the sample  and this was placed on the right arm.

## 2023-05-29 ENCOUNTER — Ambulatory Visit: Payer: Medicare HMO | Admitting: *Deleted

## 2023-05-29 ENCOUNTER — Ambulatory Visit (INDEPENDENT_AMBULATORY_CARE_PROVIDER_SITE_OTHER): Payer: Medicare HMO

## 2023-05-29 VITALS — BP 152/72 | HR 78 | Temp 98.1°F | Resp 16 | Ht 62.0 in | Wt 147.8 lb

## 2023-05-29 DIAGNOSIS — D509 Iron deficiency anemia, unspecified: Secondary | ICD-10-CM | POA: Diagnosis not present

## 2023-05-29 MED ORDER — IRON SUCROSE 20 MG/ML IV SOLN
200.0000 mg | Freq: Once | INTRAVENOUS | Status: AC
  Administered 2023-05-29: 200 mg via INTRAVENOUS
  Filled 2023-05-29: qty 10

## 2023-05-29 MED ORDER — DIPHENHYDRAMINE HCL 25 MG PO CAPS
25.0000 mg | ORAL_CAPSULE | Freq: Once | ORAL | Status: AC
  Administered 2023-05-29: 25 mg via ORAL
  Filled 2023-05-29: qty 1

## 2023-05-29 MED ORDER — ACETAMINOPHEN 325 MG PO TABS
650.0000 mg | ORAL_TABLET | Freq: Once | ORAL | Status: AC
  Administered 2023-05-29: 650 mg via ORAL
  Filled 2023-05-29: qty 2

## 2023-05-29 NOTE — Progress Notes (Signed)
Diagnosis: Iron Deficiency Anemia  Provider:  Chilton Greathouse MD  Procedure: IV Push  IV Type: Peripheral, IV Location: R Forearm  Venofer (Iron Sucrose), Dose: 200 mg  Post Infusion IV Care: Observation period completed and Peripheral IV Discontinued  Discharge: Condition: Good, Destination: Home . AVS Provided  Performed by:  Wyvonne Lenz, RN

## 2023-06-08 ENCOUNTER — Ambulatory Visit: Payer: Medicare HMO

## 2023-06-08 DIAGNOSIS — D509 Iron deficiency anemia, unspecified: Secondary | ICD-10-CM | POA: Diagnosis not present

## 2023-06-08 DIAGNOSIS — E1129 Type 2 diabetes mellitus with other diabetic kidney complication: Secondary | ICD-10-CM

## 2023-06-08 NOTE — Progress Notes (Signed)
Patient presents for a nurses visit to remove and replace the Dexcom G7 sensor.  Patient did not have her own sensor and requested a sample.  PCP was informed, approved the sample  and this was placed on the right arm.

## 2023-06-08 NOTE — Addendum Note (Signed)
Addended by: Philipp Deputy A on: 06/08/2023 11:17 AM   Modules accepted: Orders

## 2023-06-09 LAB — IRON,TIBC AND FERRITIN PANEL
%SAT: 61 % — ABNORMAL HIGH (ref 16–45)
Ferritin: 318 ng/mL — ABNORMAL HIGH (ref 16–288)
Iron: 221 ug/dL — ABNORMAL HIGH (ref 45–160)
TIBC: 361 ug/dL (ref 250–450)

## 2023-06-09 LAB — CBC WITH DIFFERENTIAL/PLATELET
Absolute Lymphocytes: 779 {cells}/uL — ABNORMAL LOW (ref 850–3900)
Absolute Monocytes: 376 {cells}/uL (ref 200–950)
Basophils Absolute: 58 {cells}/uL (ref 0–200)
Basophils Relative: 1.1 %
Eosinophils Absolute: 1415 {cells}/uL — ABNORMAL HIGH (ref 15–500)
Eosinophils Relative: 26.7 %
HCT: 41.6 % (ref 35.0–45.0)
Hemoglobin: 13.5 g/dL (ref 11.7–15.5)
MCH: 32.5 pg (ref 27.0–33.0)
MCHC: 32.5 g/dL (ref 32.0–36.0)
MCV: 100.2 fL — ABNORMAL HIGH (ref 80.0–100.0)
MPV: 12 fL (ref 7.5–12.5)
Monocytes Relative: 7.1 %
Neutro Abs: 2671 {cells}/uL (ref 1500–7800)
Neutrophils Relative %: 50.4 %
Platelets: 162 10*3/uL (ref 140–400)
RBC: 4.15 10*6/uL (ref 3.80–5.10)
RDW: 19.8 % — ABNORMAL HIGH (ref 11.0–15.0)
Total Lymphocyte: 14.7 %
WBC: 5.3 10*3/uL (ref 3.8–10.8)

## 2023-06-17 ENCOUNTER — Encounter: Payer: Self-pay | Admitting: Podiatry

## 2023-06-17 ENCOUNTER — Ambulatory Visit: Payer: Medicare HMO | Admitting: Podiatry

## 2023-06-17 DIAGNOSIS — B351 Tinea unguium: Secondary | ICD-10-CM

## 2023-06-17 DIAGNOSIS — E1149 Type 2 diabetes mellitus with other diabetic neurological complication: Secondary | ICD-10-CM

## 2023-06-17 DIAGNOSIS — M79675 Pain in left toe(s): Secondary | ICD-10-CM

## 2023-06-17 DIAGNOSIS — M79674 Pain in right toe(s): Secondary | ICD-10-CM | POA: Diagnosis not present

## 2023-06-17 DIAGNOSIS — L84 Corns and callosities: Secondary | ICD-10-CM | POA: Diagnosis not present

## 2023-06-17 DIAGNOSIS — M792 Neuralgia and neuritis, unspecified: Secondary | ICD-10-CM

## 2023-06-18 ENCOUNTER — Ambulatory Visit: Payer: Medicare HMO

## 2023-06-18 NOTE — Progress Notes (Signed)
 Pt came in for removal of dexcom 7 sensor and placement of new one. Pt tolerated well, stated she will return for next placement.

## 2023-06-24 NOTE — Progress Notes (Signed)
Subjective:  Patient ID: Anita Mccormick, female    DOB: [birth year],  MRN: [id number]  77 y.o. female presents with at risk foot care with history of diabetic neuropathy and corn(s) of both feet, callus(es) right foot and painful mycotic nails.  Pain interferes with ambulation. Aggravating factors include wearing enclosed shoe gear. Painful toenails interfere with ambulation. Aggravating factors include wearing enclosed shoe gear. Pain is relieved with periodic professional debridement. Painful corns and calluses are aggravated when weightbearing with and without shoegear. Pain is relieved with periodic professional debridement. Chief Complaint  Patient presents with   Diabetes    DFC BS - 61 A1C - 7     PCP: Deeann Saint, MD.  New problem(s): Patient states she is experiencing pain in her feet mostly at night. Pain is described as throbbing and sharp at times.  Review of Systems: Negative except as noted in the HPI.   Allergies  Allergen Reactions   Ace Inhibitors Cough    ????   Bee Venom Swelling   Chlorthalidone     REACTION: unspecified   Metformin     REACTION: gi side effects   Penicillins     REACTION: urticaria (hives)   Sulfamethoxazole     REACTION: questionable    Objective:  There were no vitals filed for this visit. Constitutional Patient is a pleasant 77 y.o. female WD, WN in NAD. AAO x 3.  Vascular Capillary fill time to digits <3 seconds.  DP/PT pulse(s) are faintly palpable b/l lower extremities. Pedal hair absent b/l. Lower extremity skin temperature gradient warm to cool b/l. No pain with calf compression b/l. No cyanosis or clubbing noted. No ischemia nor gangrene noted b/l.   Neurologic Protective sensation intact 5/5 intact bilaterally with 10g monofilament b/l. Vibratory sensation intact b/l. No clonus b/l. Pt has subjective symptoms of neuropathy.  Dermatologic Pedal skin is thin, shiny and atrophic b/l.  No open wounds b/l lower extremities. No  interdigital macerations b/l lower extremities. Toenails 1-5 b/l elongated, discolored, dystrophic, thickened, crumbly with subungual debris and tenderness to dorsal palpation. Hyperkeratotic lesion(s) right heel and bilateral 5th toes.  No erythema, no edema, no drainage, no fluctuance.  Orthopedic: Normal muscle strength 5/5 to all lower extremity muscle groups bilaterally. Adductovarus deformity bilateral 5th toes.   Last HgA1c:     Latest Ref Rng & Units 04/23/2023   12:33 PM 07/07/2022   11:51 AM  Hemoglobin A1C  Hemoglobin-A1c 4.6 - 6.5 % 6.4  5.7      Assessment:   1. Pain due to onychomycosis of toenails of both feet   2. Corns and callosities   3. Neuropathic pain   4. Type II diabetes mellitus with neurological manifestations (HCC)    Plan:  -Patient was evaluated today. All questions/concerns addressed on today's visit. -Discussed neuropathy symptoms. Advised patient/POA to purchase Nervive Pain Cream or Roll On. Apply to foot/feet before bedtime. -Patient to continue soft, supportive shoe gear daily. -Toenails 1-5 b/l were debrided in length and girth with sterile nail nippers and dremel without iatrogenic bleeding.  -Corn(s) L 5th toe and R 5th toe pared utilizing sharp debridement with sterile blade without complication or incident. Total number debrided=2. -Callus(es) right heel pared utilizing sharp debridement with sterile blade without complication or incident. Total number debrided =1. -Patient/POA to call should there be question/concern in the interim.  Return in about 3 months (around 09/17/2023).  Freddie Breech, DPM      [place] LOCATION: [address]  [address] [place] [address]                   Office [phone]   [facility] LOCATION: [address] [place], [place] [address] Office [phone]

## 2023-06-29 ENCOUNTER — Telehealth: Payer: Self-pay

## 2023-06-29 ENCOUNTER — Ambulatory Visit (INDEPENDENT_AMBULATORY_CARE_PROVIDER_SITE_OTHER): Payer: Medicare HMO

## 2023-06-29 DIAGNOSIS — E1129 Type 2 diabetes mellitus with other diabetic kidney complication: Secondary | ICD-10-CM | POA: Diagnosis not present

## 2023-06-29 NOTE — Telephone Encounter (Signed)
Pt came in for a nurse visit for dexcom.   She reports she is coughing. sx started on Friday after thanksgiving.  coughing with mucus- clear/yellow no fever,  bodyache (fell right before thanksgiving, body is sore, denied UC visit) no headache stay feeling weak.  no sore throat.   Been around with grandchildren who was coughing.   Taking tussin DM. Help loosen her cough. Pt states " she is not feeling bad".   Offer to schedule an appt, virtual visit. Pt declines and reports she had provide sent in medication for cough without a visit before. Please advise.  Inform will forward this to provider to address and then will give her a call. Verbalized understanding.

## 2023-06-29 NOTE — Progress Notes (Signed)
Pt is here for dexcom replacement. Pt tolerated well.   Pt requests for a sample. A sample of dexcom is given to pt.

## 2023-07-03 ENCOUNTER — Telehealth: Payer: Self-pay | Admitting: Family Medicine

## 2023-07-03 NOTE — Telephone Encounter (Signed)
Called and spoke with patient, she has been coughing and congestion since Monday, sch pat for Monday morning 12/9@ 8am, told patient if she gets worst or has shortness of breath to go to the ER

## 2023-07-03 NOTE — Telephone Encounter (Signed)
Spouse called to say Pt needs a shot for her excessive coughing.  Spouse did not know what this shot is called.  Could not offer Pt an OV, as there are no available appointments at this location today.  Spouse states MD has given her this shot before, without an appointment, in the car. Spoke to CMA, who had no idea what Spouse is talking about.   Please return their call, to discuss.   Phone  [phone] (H)

## 2023-07-06 ENCOUNTER — Ambulatory Visit (INDEPENDENT_AMBULATORY_CARE_PROVIDER_SITE_OTHER): Payer: Medicare HMO | Admitting: Family Medicine

## 2023-07-06 ENCOUNTER — Ambulatory Visit (INDEPENDENT_AMBULATORY_CARE_PROVIDER_SITE_OTHER): Payer: Medicare HMO

## 2023-07-06 ENCOUNTER — Encounter: Payer: Self-pay | Admitting: Family Medicine

## 2023-07-06 VITALS — BP 148/84 | HR 74 | Temp 98.2°F | Ht 62.0 in | Wt 148.2 lb

## 2023-07-06 DIAGNOSIS — E1129 Type 2 diabetes mellitus with other diabetic kidney complication: Secondary | ICD-10-CM

## 2023-07-06 DIAGNOSIS — R051 Acute cough: Secondary | ICD-10-CM

## 2023-07-06 DIAGNOSIS — F17200 Nicotine dependence, unspecified, uncomplicated: Secondary | ICD-10-CM | POA: Diagnosis not present

## 2023-07-06 DIAGNOSIS — R058 Other specified cough: Secondary | ICD-10-CM | POA: Diagnosis not present

## 2023-07-06 MED ORDER — BENZONATATE 100 MG PO CAPS: 100.0000 mg | ORAL_CAPSULE | Freq: Two times a day (BID) | ORAL | 0 refills | Status: DC | PRN

## 2023-07-06 MED ORDER — DOXYCYCLINE HYCLATE 100 MG PO TABS: 100.0000 mg | ORAL_TABLET | Freq: Two times a day (BID) | ORAL | 0 refills | Status: AC

## 2023-07-06 NOTE — Patient Instructions (Addendum)
Your cough is concerning for pneumonia versus COPD exacerbation given your history of smoking.  A prescription for doxycycline, an antibiotic was sent to your pharmacy.  A prescription for Tessalon a pill for cough was also sent.  We will also get a chest x-ray to further evaluate.  You can also use over-the-counter Ayr, nasal saline gel to help with the dryness in your nose.   Do not forget you can set up an appointment to further evaluate memory when you are feeling better.

## 2023-07-06 NOTE — Progress Notes (Signed)
Established Patient Office Visit   Subjective  Patient ID: Anita Mccormick, female    DOB: 1945/09/11  Age: 77 y.o. MRN: [id number]  Chief Complaint  Patient presents with   Cough    Started 2 weeks ago, cough and congestion with yellow mucus     Patient is a 77 year old female seen for acute concern.  Patient endorses cough x 2 weeks with nasal congestion, wheezing, headache, SOB.  Patient endorses blood-streaked mucus with blowing nose.  Cough productive.  Was coughing up clear sputum now yellow.  Patient tried DayQuil and NyQuil for symptoms.  Patient states blood sugar typically 130s to 140s.  Will increase to 200s 1 hour after eating lunch but then decreased by the afternoon.  Pt smoking 1-2 cigarettes/day.  Smoking less given current cough.  Taking iron intermittently status post iron infusions.  Pt mentions memory concerns for her and her husband.  Cough    Patient Active Problem List   Diagnosis Date Noted   Iron deficiency anemia, unspecified 05/08/2023   Vaginal discharge 04/01/2022   Central retinal artery occlusion of right eye 10/25/2021   Symptomatic anemia 06/06/2021   Hyperglycemia due to diabetes mellitus (HCC) 06/06/2021   Elevated brain natriuretic peptide (BNP) level 06/06/2021   Atherosclerosis of aorta (HCC) 05/18/2020   Cirrhosis of liver without ascites (HCC) 05/18/2020   Internal hemorrhoids 06/22/2019   Dysuria 02/20/2018   Anemia    Sepsis due to Escherichia coli (E. coli) (HCC)    AKI (acute kidney injury) (HCC)    Sepsis (HCC) 09/19/2017   UTI (urinary tract infection) 09/19/2017   Chronic kidney disease (CKD), stage II (mild) 09/19/2017   Leukopenia 09/19/2017   Chronic diastolic CHF (congestive heart failure) (HCC) 09/19/2017   Statins contraindicated 06/12/2017   Hepatotoxicity due to statin drug 06/12/2017   History of adenomatous polyp of colon 03/18/2017   Viral URI with cough 01/17/2017   Thrombocytopenia (HCC) 12/19/2015    Insomnia 08/21/2015   Nicotine use disorder 05/21/2015   Eczema 05/21/2015   Fatty liver disease, nonalcoholic 07/14/2014   Hyperlipidemia 11/03/2007   History of CVA (cerebrovascular accident) 10/20/2007   Type II diabetes mellitus with renal manifestations (HCC) 12/10/2006   Essential hypertension 12/10/2006   History of breast cancer 12/10/2006   Past Medical History:  Diagnosis Date   Arthritis    Cancer (HCC)    breast left   Cerebrovascular accident [facility])    October 29 2021- Left eye- blind   COLONIC POLYPS, HX OF 12/10/2006   Qualifier: Diagnosis of  By: Cato Mulligan MD, Bruce     Diabetes mellitus type II, controlled (HCC) 12/10/2006   Poor control on Januvia 100mg  and glipizide 10mg  XL Lab Results  Component Value Date   HGBA1C 8.3* 11/02/2014        Eczema    Fatty liver disease, nonalcoholic 07/14/2014   Per Dr. Cato Mulligan Lab Results  Component Value Date   ALT 31 11/02/2014   AST 57* 11/02/2014   ALKPHOS 104 11/02/2014   BILITOT 1.1 11/02/2014       GERD (gastroesophageal reflux disease)    History of blood transfusion    History of kidney stones 2023   11/28/21 small stone currently, taking flomax   Hypertension    Past Surgical History:  Procedure Laterality Date   ARTERY BIOPSY Left 11/29/2021   Procedure: LEFT BIOPSY TEMPORAL ARTERY;  Surgeon: Chuck Hint, MD;  Location: [facility] OR;  Service: Vascular;  Laterality: Left;   CATARACT  EXTRACTION Bilateral    MASTECTOMY Left    TUBAL LIGATION     Social History   Tobacco Use   Smoking status: Some Days    Current packs/day: 0.20    Average packs/day: 0.2 packs/day for 10.0 years (2.0 ttl pk-yrs)    Types: Cigarettes   Smokeless tobacco: Never  Vaping Use   Vaping status: Never Used  Substance Use Topics   Alcohol use: Not Currently   Drug use: No   Family History  Problem Relation Age of Onset   Stroke Mother    Cancer Father        prostate   Diabetes Sister    Pancreatic cancer Sister    Allergies   Allergen Reactions   Ace Inhibitors Cough    ????   Bee Venom Swelling   Chlorthalidone     REACTION: unspecified   Metformin     REACTION: gi side effects   Penicillins     REACTION: urticaria (hives)   Sulfamethoxazole     REACTION: questionable      Review of Systems  Respiratory:  Positive for cough.    Negative unless stated above    Objective:     BP (!) 146/82 (BP Location: Right Arm, Patient Position: Sitting, Cuff Size: Normal)   Pulse 74   Temp 98.2 F (36.8 C) (Oral)   Ht 5\' 2"  (1.575 m)   Wt 148 lb 3.2 oz (67.2 kg)   SpO2 95%   BMI 27.11 kg/m  BP Readings from Last 3 Encounters:  07/06/23 (!) 146/82  05/29/23 (!) 152/72  05/27/23 (!) 152/72   Wt Readings from Last 3 Encounters:  07/06/23 148 lb 3.2 oz (67.2 kg)  05/29/23 147 lb 12.8 oz (67 kg)  05/27/23 148 lb 9.6 oz (67.4 kg)      Physical Exam Constitutional:      General: She is not in acute distress.    Appearance: Normal appearance.  HENT:     Head: Normocephalic and atraumatic.     Nose: Nose normal.     Mouth/Throat:     Mouth: Mucous membranes are moist.  Cardiovascular:     Rate and Rhythm: Normal rate and regular rhythm.     Heart sounds: Normal heart sounds. No murmur heard.    No gallop.  Pulmonary:     Effort: Pulmonary effort is normal. No respiratory distress.     Breath sounds: Examination of the right-lower field reveals rhonchi. Examination of the left-lower field reveals decreased breath sounds and rhonchi. Decreased breath sounds and rhonchi present. No wheezing or rales.  Skin:    General: Skin is warm and dry.  Neurological:     Mental Status: She is alert and oriented to person, place, and time.      No results found for any visits on 07/06/23.    Assessment & Plan:  Acute cough -     Doxycycline Hyclate; Take 1 tablet (100 mg total) by mouth 2 (two) times daily for 7 days.  Dispense: 14 tablet; Refill: 0 -     DG Chest 2 View -     Benzonatate; Take 1  capsule (100 mg total) by mouth 2 (two) times daily as needed for cough.  Dispense: 20 capsule; Refill: 0  Tobacco use disorder  Type 2 diabetes mellitus with other diabetic kidney complication, without long-term current use of insulin (HCC)  Patient with acute cough concerning for pneumonia versus COPD exacerbation given smoking history.  Start doxycycline  due to penicillin and sulfa allergy.  Tessalon.  Obtain CXR.  Ayr nasal saline gel and other OTC meds as needed.  Smoking 1-2 cigarettes/day.  Cessation encouraged.  Continue current meds for diabetes.  Return if symptoms worsen or fail to improve.  Patient to set up appointment for memory testing and labs when feeling better.  Deeann Saint, MD

## 2023-07-06 NOTE — Telephone Encounter (Signed)
Pt scheduled and seen in office.

## 2023-07-09 ENCOUNTER — Ambulatory Visit: Payer: Medicare HMO

## 2023-07-09 NOTE — Progress Notes (Signed)
Pt came in for removal of dexcom 7 sensor and placement of new one. Pt tolerated well, stated she will return for next placement. Patient also singed Trulicity renewal form for [employer]

## 2023-07-15 ENCOUNTER — Telehealth: Payer: Self-pay

## 2023-07-20 ENCOUNTER — Ambulatory Visit (INDEPENDENT_AMBULATORY_CARE_PROVIDER_SITE_OTHER): Payer: Medicare HMO | Admitting: *Deleted

## 2023-07-20 DIAGNOSIS — E1129 Type 2 diabetes mellitus with other diabetic kidney complication: Secondary | ICD-10-CM | POA: Diagnosis not present

## 2023-07-20 LAB — POCT GLUCOSE (DEVICE FOR HOME USE): POC Glucose: 229 mg/dL — AB (ref 70–99)

## 2023-07-20 NOTE — Progress Notes (Signed)
Per orders of Dr. [place], a Dexcom was placed by [facility]. Patient provided her own Dexcom. Patient tolerated injection well.

## 2023-07-23 ENCOUNTER — Other Ambulatory Visit (HOSPITAL_COMMUNITY): Payer: Self-pay

## 2023-07-31 ENCOUNTER — Ambulatory Visit (INDEPENDENT_AMBULATORY_CARE_PROVIDER_SITE_OTHER): Payer: Medicare HMO

## 2023-07-31 DIAGNOSIS — E1129 Type 2 diabetes mellitus with other diabetic kidney complication: Secondary | ICD-10-CM | POA: Diagnosis not present

## 2023-07-31 LAB — GLUCOSE, POCT (MANUAL RESULT ENTRY): POC Glucose: 106 mg/dL — AB (ref 70–99)

## 2023-07-31 NOTE — Progress Notes (Signed)
 Pt is here for dexcom replacement. Pt tolerated well.   Pt request to check her blood glucose. Pt states she has not eaten anything. Blood sugar checked. Value: 106 mg/dL.

## 2023-08-10 ENCOUNTER — Ambulatory Visit (INDEPENDENT_AMBULATORY_CARE_PROVIDER_SITE_OTHER): Payer: Medicare HMO

## 2023-08-10 ENCOUNTER — Ambulatory Visit: Payer: Medicare HMO | Admitting: Podiatry

## 2023-08-10 DIAGNOSIS — L819 Disorder of pigmentation, unspecified: Secondary | ICD-10-CM

## 2023-08-10 DIAGNOSIS — E1129 Type 2 diabetes mellitus with other diabetic kidney complication: Secondary | ICD-10-CM | POA: Diagnosis not present

## 2023-08-10 DIAGNOSIS — D2371 Other benign neoplasm of skin of right lower limb, including hip: Secondary | ICD-10-CM

## 2023-08-10 NOTE — Progress Notes (Signed)
 Pt came in for a dexcom sensor replacement.   Pt will come back in 10 days for another replacement.

## 2023-08-10 NOTE — Progress Notes (Signed)
 HPI: 78 y.o. female presents today with her husband to have a skin lesion evaluated for possible wound.  I was personally contacted by the medical assistant at [facility] at the end of last week and requested that the patient be seen as soon as possible for a concerning black spot on the medial right heel.  The patient typically is seen by Dr. May at our office for diabetic footcare.  Her last podiatry visit was on 06/17/2023 and her next scheduled routine footcare visit is not until February 2024.  Patient denies injury.  Denies any drainage from the medial heel area.  Notes that it is very painful though.  She is wearing diabetic shoes with diabetic heat molded insoles.  It should be noted that the husband stated that their daughter was on the phone and wanted to video record the visit today.  I informed them that I personally was not comfortable being video recorded, but would be happy to entertain the phone call with the patient on speaker while conducting the exam.  It was reiterated that the patient was referred here for this specific issue and that this appointment would not replace her future-scheduled routine diabetic footcare appointment.  Past Medical History:  Diagnosis Date   Arthritis    Cancer (HCC)    breast left   Cerebrovascular accident [facility])    October 29 2021- Left eye- blind   COLONIC POLYPS, HX OF 12/10/2006   Qualifier: Diagnosis of  By: Tammie MD, Bruce     Diabetes mellitus type II, controlled (HCC) 12/10/2006   Poor control on Januvia  100mg  and glipizide  10mg  XL Lab Results  Component Value Date   HGBA1C 8.3* 11/02/2014        Eczema    Fatty liver disease, nonalcoholic 07/14/2014   Per Dr. Tammie Lab Results  Component Value Date   ALT 31 11/02/2014   AST 57* 11/02/2014   ALKPHOS 104 11/02/2014   BILITOT 1.1 11/02/2014       GERD (gastroesophageal reflux disease)    History of blood transfusion    History of kidney stones 2023   11/28/21  small stone currently, taking flomax    Hypertension     Past Surgical History:  Procedure Laterality Date   ARTERY BIOPSY Left 11/29/2021   Procedure: LEFT BIOPSY TEMPORAL ARTERY;  Surgeon: Eliza Lonni RAMAN, MD;  Location: [facility] OR;  Service: Vascular;  Laterality: Left;   CATARACT EXTRACTION Bilateral    MASTECTOMY Left    TUBAL LIGATION      Allergies  Allergen Reactions   Ace Inhibitors Cough    ????   Bee Venom Swelling   Chlorthalidone     REACTION: unspecified   Metformin      REACTION: gi side effects   Penicillins     REACTION: urticaria (hives)   Sulfamethoxazole     REACTION: questionable      Physical Exam: Pedal pulses are palpable right foot.  Generalized edema to the lower leg and ankle bilateral.  Left foot was not examined.  There was a dark, hyperpigmented skin lesion on the medial aspect of the right heel.  There is pain on palpation of the lesion.  Patient also noticed pain on the outside of the right fifth toe.  There is a tiny hyperkeratotic lesion on the distal lateral aspect of the right fifth toe lateral to the nail border.  No signs of ulceration, infection or edema are noted in the toe.  Her right shoe was evaluated and there did not appear to be any obvious defect within the shoe or the insole.  There were no foreign objects within the shoe that would be putting pressure on this area.  Assessment/Plan of Care: 1. Benign neoplasm of skin of right foot    Discussed clinical findings with patient, her husband, and the daughter on the telephone call today.  Utilizing a sterile #313 blade, the hyperkeratotic lesion on the medial aspect the right heel was shaved in order to evaluate whether there was an underlying ulceration or blister.  Upon shaving of the lesion to intact healthy skin, they were informed that there was no wound present and no further treatment would be needed to this area.  Also utilized the blade to shave the tiny skin lesion from the  right fifth toe as a courtesy.  Patient was informed that the hyperpigmentation in this area appeared to be due to chronic friction along the medial heel.  This can be reevaluated by Dr. May at her next visit.  Her diabetic insole in the right shoe was taken to our orthotics room and some of the medial heel cup was debulked utilizing the sanding equipment.  This was to determine if the insole possibly might be getting pushed back onto the foot from the shoe due to thickness along the heel counter.  If this is beneficial to the patient, she can have her other insoles evaluated to see if they also need to be sanded/debulked along the medial right heel.  The medical assistant was given the corrected insole and asked to place this back into the patient's shoe as we were finished with the exam and they were informed that there was no ulceration or blister noted that would require continued care.  They were informed to keep their upcoming visit with Dr. May for scheduled routine footcare.  The patient noted that she had immediate improvement of her pain in the right heel following the shaving of the lesion and the debulking of the shoe insole.  However, I was informed by the medical assistant that our practice administrator needed to be brought into the room after I had moved on to treat other patients, because the patient's daughter felt that her mothers concerns were not being addressed during this visit, and that she planned on contacting attorneys to come to our office regarding today's visit.  I did not directly witness these conversations and accusations and am not aware how the medical assistant responded to these comments, prio to retrieving our practice administrator to come into the exam room.  I informed our research officer, political party what occurred while I was in the exam room and noted that the patient had been told that her corns and calluses on the left foot could not be shaved as this would be  considered routine footcare and not covered by her insurance due to frequency of visits.  There did not appear to be any additional issues or problems once the patient was informed she cannot have routine footcare services performed before I had left the room.  The patient would have been self-pay and the cost out-of-pocket would have been over $200 for callus shaving today.  The practice administrator attempted to reschedule the patient with Dr. May the next day to have this care performed, but she was reminded that the patient cannot have the care performed any sooner than August 19, 2023 due to insurance requirements on frequency of at risk footcare services.  She was then rescheduled for January 22 (the earliest she could be seen again for that specified care based on her last visit here) and her February appointment was canceled.  Dr. May will be notified of the exchange that occurred today so that she may address any issues with the family at the next visit.  Patient's primary care office was also contacted and informed of the encounter.  As the patient was checking out she did apologize to me as well as the practice administrator for her daughter's behavior over the phone.   Awanda CHARM Imperial, DPM, FACFAS [facility]     [address] [place], [place] [address]                Office [phone]  Fax [phone]

## 2023-08-18 NOTE — Telephone Encounter (Signed)
Following up on pt pap for [employer] (Trulicity)pt has been APPROVED for 2025.

## 2023-08-19 ENCOUNTER — Encounter: Payer: Self-pay | Admitting: Podiatry

## 2023-08-19 ENCOUNTER — Ambulatory Visit: Payer: Medicare HMO | Admitting: Podiatry

## 2023-08-19 DIAGNOSIS — M2041 Other hammer toe(s) (acquired), right foot: Secondary | ICD-10-CM

## 2023-08-19 DIAGNOSIS — M2042 Other hammer toe(s) (acquired), left foot: Secondary | ICD-10-CM

## 2023-08-19 DIAGNOSIS — M2142 Flat foot [pes planus] (acquired), left foot: Secondary | ICD-10-CM

## 2023-08-19 DIAGNOSIS — M79675 Pain in left toe(s): Secondary | ICD-10-CM

## 2023-08-19 DIAGNOSIS — B351 Tinea unguium: Secondary | ICD-10-CM

## 2023-08-19 DIAGNOSIS — E1149 Type 2 diabetes mellitus with other diabetic neurological complication: Secondary | ICD-10-CM

## 2023-08-19 DIAGNOSIS — L84 Corns and callosities: Secondary | ICD-10-CM

## 2023-08-19 DIAGNOSIS — M79674 Pain in right toe(s): Secondary | ICD-10-CM

## 2023-08-19 DIAGNOSIS — M2141 Flat foot [pes planus] (acquired), right foot: Secondary | ICD-10-CM

## 2023-08-19 NOTE — Progress Notes (Unsigned)
  Subjective:  Patient ID: Anita Mccormick, female    DOB: [birth year],  MRN: [id number]  Anita Mccormick presents to clinic today for {jgcomplaint:23593}  Chief Complaint  Patient presents with   Debridement    Trim toenails and calluses - diabetic - 6.4, Dr. Carollee Herter Banks-LOV last week   New problem(s): None. {jgcomplaint:23593}  PCP is Deeann Saint, MD.  Allergies  Allergen Reactions   Ace Inhibitors Cough    ????   Bee Venom Swelling   Chlorthalidone     REACTION: unspecified   Metformin     REACTION: gi side effects   Penicillins     REACTION: urticaria (hives)   Sulfamethoxazole     REACTION: questionable    Review of Systems: Negative except as noted in the HPI.  Objective: No changes noted in today's physical examination. There were no vitals filed for this visit. Anita Mccormick is a pleasant 78 y.o. female {jgbodyhabitus:24098} AAO x 3.  Vascular Examination: CFT <3 seconds b/l. DP/PT pulses faintly palpable b/l. Skin temperature gradient warm to warm b/l. No pain with calf compression. No ischemia or gangrene. No cyanosis or clubbing noted b/l. {jgvascular:23595}   Neurological Examination: Sensation grossly intact b/l with 10 gram monofilament. Vibratory sensation intact b/l. {jgneuro:23601::"Protective sensation intact 5/5 intact bilaterally with 10g monofilament b/l.","Vibratory sensation intact b/l.","Proprioception intact bilaterally."}  Dermatological Examination: Pedal skin warm and supple b/l.   No open wounds. No interdigital macerations.  Toenails 1-5 b/l thick, discolored, elongated with subungual debris and pain on dorsal palpation.    {jgderm:23598}  Musculoskeletal Examination: {jgmsk:23600}  Radiographs: None  Last A1c:      Latest Ref Rng & Units 04/23/2023   12:33 PM  Hemoglobin A1C  Hemoglobin-A1c 4.6 - 6.5 % 6.4     Assessment/Plan: 1. Type II diabetes mellitus with neurological manifestations (HCC)   2. Pain due to  onychomycosis of toenails of both feet   3. Corns and callosities   4. Acquired hammertoes of both feet   5. Pes planus of both feet     Orders Placed This Encounter  Procedures   For Home Use Only DME Diabetic Shoe    Dispense one pair extra depth shoes and 3 pair total contact insoles.    FOR HOME USE ONLY DME DIABETIC SHOE {Jgplan:23602::"-Patient/POA to call should there be question/concern in the interim."}   Return in about 9 weeks (around 10/21/2023).  Freddie Breech, DPM      [place] LOCATION: [address], [place] [address]                   Office [phone]   [facility] LOCATION: [address] [place], [place] [address] Office [phone]

## 2023-08-20 ENCOUNTER — Ambulatory Visit: Payer: Medicare HMO

## 2023-08-20 NOTE — Progress Notes (Signed)
Pt came in for a dexcom sensor replacement.   Pt will come back in 10 days for another replacement.

## 2023-08-26 ENCOUNTER — Encounter: Payer: Self-pay | Admitting: Family Medicine

## 2023-08-26 ENCOUNTER — Ambulatory Visit (INDEPENDENT_AMBULATORY_CARE_PROVIDER_SITE_OTHER): Payer: Medicare HMO | Admitting: Family Medicine

## 2023-08-26 VITALS — BP 140/80 | HR 84 | Temp 98.9°F | Ht 62.0 in | Wt 149.2 lb

## 2023-08-26 DIAGNOSIS — Z7984 Long term (current) use of oral hypoglycemic drugs: Secondary | ICD-10-CM

## 2023-08-26 DIAGNOSIS — E1129 Type 2 diabetes mellitus with other diabetic kidney complication: Secondary | ICD-10-CM | POA: Diagnosis not present

## 2023-08-26 DIAGNOSIS — I10 Essential (primary) hypertension: Secondary | ICD-10-CM | POA: Diagnosis not present

## 2023-08-26 DIAGNOSIS — R051 Acute cough: Secondary | ICD-10-CM | POA: Diagnosis not present

## 2023-08-26 DIAGNOSIS — J069 Acute upper respiratory infection, unspecified: Secondary | ICD-10-CM

## 2023-08-26 DIAGNOSIS — Z7985 Long-term (current) use of injectable non-insulin antidiabetic drugs: Secondary | ICD-10-CM | POA: Diagnosis not present

## 2023-08-26 DIAGNOSIS — H6123 Impacted cerumen, bilateral: Secondary | ICD-10-CM | POA: Diagnosis not present

## 2023-08-26 MED ORDER — BENZONATATE 100 MG PO CAPS: 100.0000 mg | ORAL_CAPSULE | Freq: Two times a day (BID) | ORAL | 0 refills | Status: DC | PRN

## 2023-08-26 NOTE — Progress Notes (Signed)
Established Patient Office Visit   Subjective  Patient ID: Anita Mccormick, female    DOB: 1946/04/27  Age: 78 y.o. MRN: [id number]  Chief Complaint  Patient presents with   Cough    Started a week ago cough and congestion     Patient is a 78 year old female seen for acute concern.  Patient endorses cough x 1 week.  Also with rhinorrhea, irritated throat, intermittent headache.  Feeling a little better.  Denies facial pain/pressure, ear pain/pressure, fever, loose stools.  Appetite slightly decreased.  Taking DayQuil and NyQuil for symptoms.  States her grandsons have been sick off and on for the last few months then she gets sick.  Patient states blood sugar higher after meals.  Drinking more sodas lately.  Tired of drinking water.  Dexcom sensor in place.  Endorses a few low blood sugars, 60s in the morning.  Will typically eat a snack like nabs before bed to help prevent low blood sugar.  Per Dexcom average A1c in the last 90 days 6.8%.   Patient Active Problem List   Diagnosis Date Noted   Iron deficiency anemia, unspecified 05/08/2023   Vaginal discharge 04/01/2022   Central retinal artery occlusion of right eye 10/25/2021   Symptomatic anemia 06/06/2021   Hyperglycemia due to diabetes mellitus (HCC) 06/06/2021   Elevated brain natriuretic peptide (BNP) level 06/06/2021   Atherosclerosis of aorta (HCC) 05/18/2020   Cirrhosis of liver without ascites (HCC) 05/18/2020   Internal hemorrhoids 06/22/2019   Dysuria 02/20/2018   Anemia    Sepsis due to Escherichia coli (E. coli) (HCC)    AKI (acute kidney injury) (HCC)    Sepsis (HCC) 09/19/2017   UTI (urinary tract infection) 09/19/2017   Chronic kidney disease (CKD), stage II (mild) 09/19/2017   Leukopenia 09/19/2017   Chronic diastolic CHF (congestive heart failure) (HCC) 09/19/2017   Statins contraindicated 06/12/2017   Hepatotoxicity due to statin drug 06/12/2017   History of adenomatous polyp of colon 03/18/2017   Viral URI  with cough 01/17/2017   Thrombocytopenia (HCC) 12/19/2015   Insomnia 08/21/2015   Nicotine use disorder 05/21/2015   Eczema 05/21/2015   Fatty liver disease, nonalcoholic 07/14/2014   Hyperlipidemia 11/03/2007   History of CVA (cerebrovascular accident) 10/20/2007   Type II diabetes mellitus with renal manifestations (HCC) 12/10/2006   Essential hypertension 12/10/2006   History of breast cancer 12/10/2006   Past Medical History:  Diagnosis Date   Arthritis    Cancer (HCC)    breast left   Cerebrovascular accident [facility])    October 29 2021- Left eye- blind   COLONIC POLYPS, HX OF 12/10/2006   Qualifier: Diagnosis of  By: Cato Mulligan MD, Bruce     Diabetes mellitus type II, controlled (HCC) 12/10/2006   Poor control on Januvia 100mg  and glipizide 10mg  XL Lab Results  Component Value Date   HGBA1C 8.3* 11/02/2014        Eczema    Fatty liver disease, nonalcoholic 07/14/2014   Per Dr. Cato Mulligan Lab Results  Component Value Date   ALT 31 11/02/2014   AST 57* 11/02/2014   ALKPHOS 104 11/02/2014   BILITOT 1.1 11/02/2014       GERD (gastroesophageal reflux disease)    History of blood transfusion    History of kidney stones 2023   11/28/21 small stone currently, taking flomax   Hypertension    Past Surgical History:  Procedure Laterality Date   ARTERY BIOPSY Left 11/29/2021   Procedure: LEFT BIOPSY TEMPORAL  ARTERY;  Surgeon: Chuck Hint, MD;  Location: [facility] OR;  Service: Vascular;  Laterality: Left;   CATARACT EXTRACTION Bilateral    MASTECTOMY Left    TUBAL LIGATION     Social History   Tobacco Use   Smoking status: Some Days    Current packs/day: 0.20    Average packs/day: 0.2 packs/day for 10.0 years (2.0 ttl pk-yrs)    Types: Cigarettes   Smokeless tobacco: Never  Vaping Use   Vaping status: Never Used  Substance Use Topics   Alcohol use: Not Currently   Drug use: No   Family History  Problem Relation Age of Onset   Stroke Mother    Cancer Father        prostate    Diabetes Sister    Pancreatic cancer Sister    Allergies  Allergen Reactions   Ace Inhibitors Cough    ????   Bee Venom Swelling   Chlorthalidone     REACTION: unspecified   Metformin     REACTION: gi side effects   Penicillins     REACTION: urticaria (hives)   Sulfamethoxazole     REACTION: questionable      ROS Negative unless stated above    Objective:     BP (!) 140/80 (BP Location: Right Arm, Patient Position: Sitting, Cuff Size: Normal)   Pulse 84   Temp 98.9 F (37.2 C) (Oral)   Ht 5\' 2"  (1.575 m)   Wt 149 lb 3.2 oz (67.7 kg)   SpO2 96%   BMI 27.29 kg/m  BP Readings from Last 3 Encounters:  08/26/23 (!) 140/80  07/06/23 (!) 148/84  05/29/23 (!) 152/72   Wt Readings from Last 3 Encounters:  08/26/23 149 lb 3.2 oz (67.7 kg)  07/06/23 148 lb 3.2 oz (67.2 kg)  05/29/23 147 lb 12.8 oz (67 kg)      Physical Exam Constitutional:      General: She is not in acute distress.    Appearance: Normal appearance.  HENT:     Head: Normocephalic and atraumatic.     Comments: B/l cerumen impaction.  Ears irrigated. TMs normal s/p.    Right Ear: Tympanic membrane normal. There is impacted cerumen.     Left Ear: Tympanic membrane normal. There is impacted cerumen.     Nose: Nose normal.     Mouth/Throat:     Mouth: Mucous membranes are moist.  Cardiovascular:     Rate and Rhythm: Normal rate and regular rhythm.     Heart sounds: Normal heart sounds. No murmur heard.    No gallop.  Pulmonary:     Effort: Pulmonary effort is normal. No respiratory distress.     Breath sounds: Normal breath sounds. No wheezing, rhonchi or rales.     Comments: Intermittently productive and dry sounding cough. Skin:    General: Skin is warm and dry.  Neurological:     Mental Status: She is alert and oriented to person, place, and time.      No results found for any visits on 08/26/23.    Assessment & Plan:  Acute cough -Acute cough likely due to viral etiology -Tessalon  as needed -OTC cough/cold medication for people with high blood pressure and DM.  Okay to use Safetussin. -monitor for continued/worsened symptoms. -     Benzonatate; Take 1 capsule (100 mg total) by mouth 2 (two) times daily as needed for cough.  Dispense: 20 capsule; Refill: 0  Viral URI -Supportive care with OTC  cough/cold medications -Frequent handwashing encouraged  Bilateral impacted cerumen -Consent obtained.  Bilateral ears irrigated.  Pt tolerated procedure well.  Type 2 diabetes mellitus with other diabetic kidney complication, without long-term current use of insulin (HCC) -Hemoglobin A1c 6.4% on 04/23/2023 -Continue current medications including glipizide XL 10 mg daily with breakfast, Trulicity 1.5 mg weekly  Essential hypertension -Elevated -Likely 2/2 cough/cold medication, dehydration -Continue atenolol 100 mg twice daily  Return if symptoms worsen or fail to improve.   Deeann Saint, MD

## 2023-08-26 NOTE — Patient Instructions (Signed)
You can find over-the-counter at your local drugstore safe-Tussin or Coricidin HBP to treat your symptoms.  Avoid cough and cold medications with decongestant in them as this can increase your blood pressure.

## 2023-08-31 ENCOUNTER — Ambulatory Visit: Payer: Medicare HMO

## 2023-08-31 ENCOUNTER — Telehealth: Payer: Self-pay

## 2023-08-31 DIAGNOSIS — E1129 Type 2 diabetes mellitus with other diabetic kidney complication: Secondary | ICD-10-CM

## 2023-08-31 NOTE — Progress Notes (Unsigned)
 Pt came in for a dexcom sensor replacement.   Pt will come back in 10 days for another replacement.

## 2023-08-31 NOTE — Telephone Encounter (Signed)
Seen patient,

## 2023-08-31 NOTE — Telephone Encounter (Signed)
Copied from CRM [id number]. Topic: Clinical - Medical Advice >> Aug 31, 2023  8:34 AM Elizebeth Brooking wrote: Reason for CRM: Patient called in stating that the she wasn't able to get her Dexcom from the pharmacy and wanted to know if the office has samples for her appointment today is requesting a callback from the nurse to assist with this matter

## 2023-09-10 ENCOUNTER — Ambulatory Visit: Payer: Medicare HMO

## 2023-09-10 DIAGNOSIS — D509 Iron deficiency anemia, unspecified: Secondary | ICD-10-CM

## 2023-09-10 NOTE — Progress Notes (Signed)
Pt came in for a dexcom sensor replacement.   Pt will come back in 10 days for another replacement.   Patient is also getting labs today per Dr. Salomon Fick

## 2023-09-11 LAB — CBC WITH DIFFERENTIAL/PLATELET
Absolute Lymphocytes: 659 {cells}/uL — ABNORMAL LOW (ref 850–3900)
Absolute Monocytes: 410 {cells}/uL (ref 200–950)
Basophils Absolute: 83 {cells}/uL (ref 0–200)
Basophils Relative: 1.3 %
Eosinophils Absolute: 1773 {cells}/uL — ABNORMAL HIGH (ref 15–500)
Eosinophils Relative: 27.7 %
HCT: 34.4 % — ABNORMAL LOW (ref 35.0–45.0)
Hemoglobin: 11.4 g/dL — ABNORMAL LOW (ref 11.7–15.5)
MCH: 35.1 pg — ABNORMAL HIGH (ref 27.0–33.0)
MCHC: 33.1 g/dL (ref 32.0–36.0)
MCV: 105.8 fL — ABNORMAL HIGH (ref 80.0–100.0)
MPV: 11.7 fL (ref 7.5–12.5)
Monocytes Relative: 6.4 %
Neutro Abs: 3475 {cells}/uL (ref 1500–7800)
Neutrophils Relative %: 54.3 %
Platelets: 159 Thousand/uL (ref 140–400)
RBC: 3.25 Million/uL — ABNORMAL LOW (ref 3.80–5.10)
RDW: 11.6 % (ref 11.0–15.0)
Total Lymphocyte: 10.3 %
WBC: 6.4 Thousand/uL (ref 3.8–10.8)

## 2023-09-11 LAB — IRON,TIBC AND FERRITIN PANEL
%SAT: 51 % — ABNORMAL HIGH (ref 16–45)
Ferritin: 51 ng/mL (ref 16–288)
Iron: 189 ug/dL — ABNORMAL HIGH (ref 45–160)
TIBC: 369 ug/dL (ref 250–450)

## 2023-09-18 ENCOUNTER — Ambulatory Visit: Payer: Medicare HMO

## 2023-09-18 ENCOUNTER — Encounter: Payer: Self-pay | Admitting: Family Medicine

## 2023-09-18 NOTE — Progress Notes (Signed)
 Pt came in for a dexcom sensor replacement.   Pt will come back in 10 days for another replacement.

## 2023-09-21 ENCOUNTER — Encounter (INDEPENDENT_AMBULATORY_CARE_PROVIDER_SITE_OTHER): Payer: Medicare HMO | Admitting: Ophthalmology

## 2023-09-21 DIAGNOSIS — H353111 Nonexudative age-related macular degeneration, right eye, early dry stage: Secondary | ICD-10-CM

## 2023-09-21 DIAGNOSIS — H43813 Vitreous degeneration, bilateral: Secondary | ICD-10-CM

## 2023-09-21 DIAGNOSIS — I10 Essential (primary) hypertension: Secondary | ICD-10-CM

## 2023-09-21 DIAGNOSIS — H35033 Hypertensive retinopathy, bilateral: Secondary | ICD-10-CM | POA: Diagnosis not present

## 2023-09-21 DIAGNOSIS — H3412 Central retinal artery occlusion, left eye: Secondary | ICD-10-CM

## 2023-09-22 ENCOUNTER — Other Ambulatory Visit: Payer: Self-pay | Admitting: Family Medicine

## 2023-09-22 ENCOUNTER — Ambulatory Visit: Payer: Medicare HMO | Admitting: Podiatry

## 2023-09-23 ENCOUNTER — Telehealth: Payer: Self-pay

## 2023-09-23 ENCOUNTER — Other Ambulatory Visit (HOSPITAL_BASED_OUTPATIENT_CLINIC_OR_DEPARTMENT_OTHER): Payer: Self-pay

## 2023-09-23 ENCOUNTER — Other Ambulatory Visit (HOSPITAL_COMMUNITY): Payer: Self-pay

## 2023-09-23 MED ORDER — DEXCOM G7 SENSOR MISC: 1.0000 | Status: DC

## 2023-09-23 NOTE — Telephone Encounter (Signed)
 Called patient and Dexcom7 is being filled at [facility]

## 2023-09-23 NOTE — Telephone Encounter (Signed)
 Copied from CRM [id number]. Topic: Clinical - Prescription Issue >> Sep 23, 2023 11:19 AM Martinique E wrote: Reason for CRM: Patient called in regarding her Continuous Glucose Sensor (DEXCOM G7 SENSOR) MISC prescription. The [facility] on [address] relayed to patient that they are out of sensors, as they're on back order. Pharmacy told patient that the [facility] should have plenty in stock for her. Patient was wondering if this sensor could get sent to that pharmacy instead for the time being. >> Sep 23, 2023  2:41 PM Corin V wrote: Patient called and stated the [facility] does not have the glucose sensors but [facility] possibly having them. She needs her new one by 09/29/23. Please assist patient with finding a location that has the sensors available and let her know where the script is sent to.

## 2023-09-23 NOTE — Telephone Encounter (Signed)
 Spoke with patient she is aware

## 2023-09-23 NOTE — Telephone Encounter (Signed)
 Copied from CRM [id number]. Topic: Clinical - Prescription Issue >> Sep 23, 2023 11:19 AM Martinique E wrote: Reason for CRM: Patient called in regarding her Continuous Glucose Sensor (DEXCOM G7 SENSOR) MISC prescription. The [facility] on [address] relayed to patient that they are out of sensors, as they're on back order. Pharmacy told patient that the [facility] should have plenty in stock for her. Patient was wondering if this sensor could get sent to that pharmacy instead for the time being.

## 2023-09-23 NOTE — Addendum Note (Signed)
 Addended by: Philipp Deputy A on: 09/23/2023 11:45 AM   Modules accepted: Orders

## 2023-09-26 ENCOUNTER — Other Ambulatory Visit: Payer: Self-pay | Admitting: Family Medicine

## 2023-09-26 DIAGNOSIS — E1129 Type 2 diabetes mellitus with other diabetic kidney complication: Secondary | ICD-10-CM

## 2023-09-29 ENCOUNTER — Other Ambulatory Visit: Payer: Self-pay

## 2023-09-29 ENCOUNTER — Ambulatory Visit (INDEPENDENT_AMBULATORY_CARE_PROVIDER_SITE_OTHER): Payer: Medicare HMO

## 2023-09-29 DIAGNOSIS — E538 Deficiency of other specified B group vitamins: Secondary | ICD-10-CM

## 2023-09-29 DIAGNOSIS — D696 Thrombocytopenia, unspecified: Secondary | ICD-10-CM

## 2023-09-29 LAB — VITAMIN B12: Vitamin B-12: 705 pg/mL (ref 211–911)

## 2023-09-29 MED ORDER — CYANOCOBALAMIN 1000 MCG/ML IJ SOLN
1000.0000 ug | Freq: Once | INTRAMUSCULAR | Status: AC
  Administered 2023-09-29: 1000 ug via INTRAMUSCULAR

## 2023-09-29 NOTE — Addendum Note (Signed)
 Addended by: Kathreen Devoid on: 09/29/2023 11:34 AM   Modules accepted: Orders

## 2023-09-29 NOTE — Progress Notes (Signed)
 Per orders of Dr. Salomon Fick, injection of B-12 given by Stann Ore. Patient tolerated injection well. Right arm

## 2023-10-01 ENCOUNTER — Encounter: Payer: Self-pay | Admitting: Family Medicine

## 2023-10-09 ENCOUNTER — Ambulatory Visit

## 2023-10-09 DIAGNOSIS — E1129 Type 2 diabetes mellitus with other diabetic kidney complication: Secondary | ICD-10-CM

## 2023-10-09 NOTE — Progress Notes (Signed)
 Pt came in for a dexcom sensor replacement.   Pt will come back in 10 days for another replacement.

## 2023-10-12 ENCOUNTER — Other Ambulatory Visit (HOSPITAL_BASED_OUTPATIENT_CLINIC_OR_DEPARTMENT_OTHER): Payer: Self-pay

## 2023-10-12 ENCOUNTER — Other Ambulatory Visit (HOSPITAL_COMMUNITY): Payer: Self-pay

## 2023-10-13 ENCOUNTER — Encounter (HOSPITAL_COMMUNITY): Payer: Self-pay | Admitting: Internal Medicine

## 2023-10-13 ENCOUNTER — Inpatient Hospital Stay (HOSPITAL_COMMUNITY)

## 2023-10-13 ENCOUNTER — Emergency Department (HOSPITAL_COMMUNITY)

## 2023-10-13 ENCOUNTER — Other Ambulatory Visit: Payer: Self-pay

## 2023-10-13 ENCOUNTER — Observation Stay (HOSPITAL_COMMUNITY)

## 2023-10-13 ENCOUNTER — Inpatient Hospital Stay (HOSPITAL_COMMUNITY)
Admission: EM | Admit: 2023-10-13 | Discharge: 2023-11-11 | DRG: 871 | Disposition: A | Discharge status: Home Health | Attending: Student | Admitting: Student

## 2023-10-13 DIAGNOSIS — H5462 Unqualified visual loss, left eye, normal vision right eye: Secondary | ICD-10-CM | POA: Diagnosis present

## 2023-10-13 DIAGNOSIS — E119 Type 2 diabetes mellitus without complications: Secondary | ICD-10-CM

## 2023-10-13 DIAGNOSIS — A4189 Other specified sepsis: Principal | ICD-10-CM | POA: Diagnosis present

## 2023-10-13 DIAGNOSIS — K5710 Diverticulosis of small intestine without perforation or abscess without bleeding: Secondary | ICD-10-CM | POA: Diagnosis not present

## 2023-10-13 DIAGNOSIS — I214 Non-ST elevation (NSTEMI) myocardial infarction: Secondary | ICD-10-CM

## 2023-10-13 DIAGNOSIS — K760 Fatty (change of) liver, not elsewhere classified: Secondary | ICD-10-CM | POA: Diagnosis present

## 2023-10-13 DIAGNOSIS — K31811 Angiodysplasia of stomach and duodenum with bleeding: Secondary | ICD-10-CM | POA: Diagnosis not present

## 2023-10-13 DIAGNOSIS — I700 Atherosclerosis of aorta: Secondary | ICD-10-CM | POA: Diagnosis not present

## 2023-10-13 DIAGNOSIS — J9602 Acute respiratory failure with hypercapnia: Secondary | ICD-10-CM | POA: Diagnosis present

## 2023-10-13 DIAGNOSIS — N182 Chronic kidney disease, stage 2 (mild): Secondary | ICD-10-CM | POA: Diagnosis not present

## 2023-10-13 DIAGNOSIS — D175 Benign lipomatous neoplasm of intra-abdominal organs: Secondary | ICD-10-CM | POA: Diagnosis present

## 2023-10-13 DIAGNOSIS — S2232XA Fracture of one rib, left side, initial encounter for closed fracture: Secondary | ICD-10-CM | POA: Diagnosis not present

## 2023-10-13 DIAGNOSIS — K7581 Nonalcoholic steatohepatitis (NASH): Secondary | ICD-10-CM | POA: Diagnosis present

## 2023-10-13 DIAGNOSIS — F05 Delirium due to known physiological condition: Secondary | ICD-10-CM | POA: Diagnosis not present

## 2023-10-13 DIAGNOSIS — R195 Other fecal abnormalities: Secondary | ICD-10-CM

## 2023-10-13 DIAGNOSIS — Z7902 Long term (current) use of antithrombotics/antiplatelets: Secondary | ICD-10-CM

## 2023-10-13 DIAGNOSIS — I130 Hypertensive heart and chronic kidney disease with heart failure and stage 1 through stage 4 chronic kidney disease, or unspecified chronic kidney disease: Secondary | ICD-10-CM | POA: Diagnosis present

## 2023-10-13 DIAGNOSIS — K635 Polyp of colon: Secondary | ICD-10-CM | POA: Diagnosis not present

## 2023-10-13 DIAGNOSIS — Z6827 Body mass index (BMI) 27.0-27.9, adult: Secondary | ICD-10-CM

## 2023-10-13 DIAGNOSIS — Z7985 Long-term (current) use of injectable non-insulin antidiabetic drugs: Secondary | ICD-10-CM

## 2023-10-13 DIAGNOSIS — N179 Acute kidney failure, unspecified: Secondary | ICD-10-CM | POA: Diagnosis present

## 2023-10-13 DIAGNOSIS — R6521 Severe sepsis with septic shock: Secondary | ICD-10-CM | POA: Diagnosis not present

## 2023-10-13 DIAGNOSIS — E1129 Type 2 diabetes mellitus with other diabetic kidney complication: Secondary | ICD-10-CM | POA: Diagnosis not present

## 2023-10-13 DIAGNOSIS — J930 Spontaneous tension pneumothorax: Secondary | ICD-10-CM | POA: Diagnosis not present

## 2023-10-13 DIAGNOSIS — J9601 Acute respiratory failure with hypoxia: Secondary | ICD-10-CM | POA: Diagnosis not present

## 2023-10-13 DIAGNOSIS — Z8042 Family history of malignant neoplasm of prostate: Secondary | ICD-10-CM

## 2023-10-13 DIAGNOSIS — F17210 Nicotine dependence, cigarettes, uncomplicated: Secondary | ICD-10-CM | POA: Diagnosis not present

## 2023-10-13 DIAGNOSIS — R579 Shock, unspecified: Secondary | ICD-10-CM | POA: Diagnosis not present

## 2023-10-13 DIAGNOSIS — Z9012 Acquired absence of left breast and nipple: Secondary | ICD-10-CM

## 2023-10-13 DIAGNOSIS — R0902 Hypoxemia: Secondary | ICD-10-CM | POA: Diagnosis not present

## 2023-10-13 DIAGNOSIS — I5032 Chronic diastolic (congestive) heart failure: Secondary | ICD-10-CM | POA: Diagnosis present

## 2023-10-13 DIAGNOSIS — Z8673 Personal history of transient ischemic attack (TIA), and cerebral infarction without residual deficits: Secondary | ICD-10-CM | POA: Diagnosis not present

## 2023-10-13 DIAGNOSIS — J101 Influenza due to other identified influenza virus with other respiratory manifestations: Secondary | ICD-10-CM | POA: Diagnosis not present

## 2023-10-13 DIAGNOSIS — K7460 Unspecified cirrhosis of liver: Secondary | ICD-10-CM | POA: Diagnosis not present

## 2023-10-13 DIAGNOSIS — I160 Hypertensive urgency: Secondary | ICD-10-CM | POA: Diagnosis not present

## 2023-10-13 DIAGNOSIS — R0602 Shortness of breath: Secondary | ICD-10-CM | POA: Diagnosis not present

## 2023-10-13 DIAGNOSIS — Z91030 Bee allergy status: Secondary | ICD-10-CM

## 2023-10-13 DIAGNOSIS — T380X5A Adverse effect of glucocorticoids and synthetic analogues, initial encounter: Secondary | ICD-10-CM | POA: Diagnosis not present

## 2023-10-13 DIAGNOSIS — R55 Syncope and collapse: Secondary | ICD-10-CM

## 2023-10-13 DIAGNOSIS — I2510 Atherosclerotic heart disease of native coronary artery without angina pectoris: Secondary | ICD-10-CM | POA: Diagnosis present

## 2023-10-13 DIAGNOSIS — D649 Anemia, unspecified: Secondary | ICD-10-CM | POA: Diagnosis not present

## 2023-10-13 DIAGNOSIS — D123 Benign neoplasm of transverse colon: Secondary | ICD-10-CM | POA: Diagnosis present

## 2023-10-13 DIAGNOSIS — J441 Chronic obstructive pulmonary disease with (acute) exacerbation: Secondary | ICD-10-CM | POA: Diagnosis present

## 2023-10-13 DIAGNOSIS — Z79899 Other long term (current) drug therapy: Secondary | ICD-10-CM

## 2023-10-13 DIAGNOSIS — K3189 Other diseases of stomach and duodenum: Secondary | ICD-10-CM | POA: Diagnosis not present

## 2023-10-13 DIAGNOSIS — K2289 Other specified disease of esophagus: Secondary | ICD-10-CM | POA: Diagnosis not present

## 2023-10-13 DIAGNOSIS — I469 Cardiac arrest, cause unspecified: Secondary | ICD-10-CM | POA: Diagnosis not present

## 2023-10-13 DIAGNOSIS — Z4682 Encounter for fitting and adjustment of non-vascular catheter: Secondary | ICD-10-CM | POA: Diagnosis not present

## 2023-10-13 DIAGNOSIS — Z87442 Personal history of urinary calculi: Secondary | ICD-10-CM

## 2023-10-13 DIAGNOSIS — E785 Hyperlipidemia, unspecified: Secondary | ICD-10-CM | POA: Diagnosis present

## 2023-10-13 DIAGNOSIS — E8729 Other acidosis: Secondary | ICD-10-CM | POA: Diagnosis not present

## 2023-10-13 DIAGNOSIS — D6959 Other secondary thrombocytopenia: Secondary | ICD-10-CM | POA: Diagnosis present

## 2023-10-13 DIAGNOSIS — K5900 Constipation, unspecified: Secondary | ICD-10-CM | POA: Diagnosis present

## 2023-10-13 DIAGNOSIS — K31819 Angiodysplasia of stomach and duodenum without bleeding: Secondary | ICD-10-CM | POA: Diagnosis not present

## 2023-10-13 DIAGNOSIS — I468 Cardiac arrest due to other underlying condition: Secondary | ICD-10-CM | POA: Diagnosis present

## 2023-10-13 DIAGNOSIS — I10 Essential (primary) hypertension: Secondary | ICD-10-CM | POA: Diagnosis present

## 2023-10-13 DIAGNOSIS — Z7951 Long term (current) use of inhaled steroids: Secondary | ICD-10-CM

## 2023-10-13 DIAGNOSIS — J1001 Influenza due to other identified influenza virus with the same other identified influenza virus pneumonia: Secondary | ICD-10-CM | POA: Diagnosis not present

## 2023-10-13 DIAGNOSIS — G9341 Metabolic encephalopathy: Secondary | ICD-10-CM | POA: Diagnosis not present

## 2023-10-13 DIAGNOSIS — I6782 Cerebral ischemia: Secondary | ICD-10-CM | POA: Diagnosis not present

## 2023-10-13 DIAGNOSIS — K2970 Gastritis, unspecified, without bleeding: Secondary | ICD-10-CM | POA: Diagnosis not present

## 2023-10-13 DIAGNOSIS — J449 Chronic obstructive pulmonary disease, unspecified: Secondary | ICD-10-CM

## 2023-10-13 DIAGNOSIS — R9389 Abnormal findings on diagnostic imaging of other specified body structures: Secondary | ICD-10-CM | POA: Diagnosis not present

## 2023-10-13 DIAGNOSIS — T797XXA Traumatic subcutaneous emphysema, initial encounter: Secondary | ICD-10-CM | POA: Diagnosis not present

## 2023-10-13 DIAGNOSIS — Z7984 Long term (current) use of oral hypoglycemic drugs: Secondary | ICD-10-CM

## 2023-10-13 DIAGNOSIS — J984 Other disorders of lung: Secondary | ICD-10-CM | POA: Diagnosis not present

## 2023-10-13 DIAGNOSIS — I5030 Unspecified diastolic (congestive) heart failure: Secondary | ICD-10-CM

## 2023-10-13 DIAGNOSIS — E1122 Type 2 diabetes mellitus with diabetic chronic kidney disease: Secondary | ICD-10-CM | POA: Diagnosis not present

## 2023-10-13 DIAGNOSIS — B9789 Other viral agents as the cause of diseases classified elsewhere: Secondary | ICD-10-CM | POA: Diagnosis not present

## 2023-10-13 DIAGNOSIS — B3781 Candidal esophagitis: Secondary | ICD-10-CM | POA: Diagnosis not present

## 2023-10-13 DIAGNOSIS — I6381 Other cerebral infarction due to occlusion or stenosis of small artery: Secondary | ICD-10-CM | POA: Diagnosis not present

## 2023-10-13 DIAGNOSIS — R7989 Other specified abnormal findings of blood chemistry: Secondary | ICD-10-CM | POA: Diagnosis not present

## 2023-10-13 DIAGNOSIS — D509 Iron deficiency anemia, unspecified: Secondary | ICD-10-CM | POA: Diagnosis not present

## 2023-10-13 DIAGNOSIS — Z800 Family history of malignant neoplasm of digestive organs: Secondary | ICD-10-CM

## 2023-10-13 DIAGNOSIS — E1165 Type 2 diabetes mellitus with hyperglycemia: Secondary | ICD-10-CM | POA: Diagnosis not present

## 2023-10-13 DIAGNOSIS — K219 Gastro-esophageal reflux disease without esophagitis: Secondary | ICD-10-CM | POA: Diagnosis present

## 2023-10-13 DIAGNOSIS — L89312 Pressure ulcer of right buttock, stage 2: Secondary | ICD-10-CM | POA: Diagnosis not present

## 2023-10-13 DIAGNOSIS — Z853 Personal history of malignant neoplasm of breast: Secondary | ICD-10-CM

## 2023-10-13 DIAGNOSIS — Z751 Person awaiting admission to adequate facility elsewhere: Secondary | ICD-10-CM

## 2023-10-13 DIAGNOSIS — Z7982 Long term (current) use of aspirin: Secondary | ICD-10-CM

## 2023-10-13 DIAGNOSIS — K648 Other hemorrhoids: Secondary | ICD-10-CM | POA: Diagnosis not present

## 2023-10-13 DIAGNOSIS — E440 Moderate protein-calorie malnutrition: Secondary | ICD-10-CM | POA: Insufficient documentation

## 2023-10-13 DIAGNOSIS — Z888 Allergy status to other drugs, medicaments and biological substances status: Secondary | ICD-10-CM

## 2023-10-13 DIAGNOSIS — Z8616 Personal history of COVID-19: Secondary | ICD-10-CM | POA: Diagnosis not present

## 2023-10-13 DIAGNOSIS — Z860100 Personal history of colon polyps, unspecified: Secondary | ICD-10-CM

## 2023-10-13 DIAGNOSIS — Z882 Allergy status to sulfonamides status: Secondary | ICD-10-CM

## 2023-10-13 DIAGNOSIS — J939 Pneumothorax, unspecified: Secondary | ICD-10-CM

## 2023-10-13 DIAGNOSIS — R059 Cough, unspecified: Secondary | ICD-10-CM | POA: Diagnosis not present

## 2023-10-13 DIAGNOSIS — M1990 Unspecified osteoarthritis, unspecified site: Secondary | ICD-10-CM | POA: Diagnosis present

## 2023-10-13 DIAGNOSIS — I5033 Acute on chronic diastolic (congestive) heart failure: Secondary | ICD-10-CM | POA: Diagnosis present

## 2023-10-13 DIAGNOSIS — Z833 Family history of diabetes mellitus: Secondary | ICD-10-CM

## 2023-10-13 DIAGNOSIS — R918 Other nonspecific abnormal finding of lung field: Secondary | ICD-10-CM | POA: Diagnosis not present

## 2023-10-13 DIAGNOSIS — J09X2 Influenza due to identified novel influenza A virus with other respiratory manifestations: Secondary | ICD-10-CM | POA: Diagnosis not present

## 2023-10-13 DIAGNOSIS — Z880 Allergy status to penicillin: Secondary | ICD-10-CM

## 2023-10-13 DIAGNOSIS — J09X1 Influenza due to identified novel influenza A virus with pneumonia: Secondary | ICD-10-CM | POA: Diagnosis not present

## 2023-10-13 DIAGNOSIS — R54 Age-related physical debility: Secondary | ICD-10-CM | POA: Diagnosis present

## 2023-10-13 DIAGNOSIS — E1151 Type 2 diabetes mellitus with diabetic peripheral angiopathy without gangrene: Secondary | ICD-10-CM | POA: Diagnosis present

## 2023-10-13 DIAGNOSIS — Z452 Encounter for adjustment and management of vascular access device: Secondary | ICD-10-CM | POA: Diagnosis not present

## 2023-10-13 DIAGNOSIS — R062 Wheezing: Secondary | ICD-10-CM | POA: Diagnosis not present

## 2023-10-13 DIAGNOSIS — L309 Dermatitis, unspecified: Secondary | ICD-10-CM | POA: Diagnosis present

## 2023-10-13 DIAGNOSIS — I440 Atrioventricular block, first degree: Secondary | ICD-10-CM | POA: Diagnosis present

## 2023-10-13 DIAGNOSIS — J189 Pneumonia, unspecified organism: Secondary | ICD-10-CM | POA: Diagnosis not present

## 2023-10-13 DIAGNOSIS — Z860101 Personal history of adenomatous and serrated colon polyps: Secondary | ICD-10-CM | POA: Diagnosis not present

## 2023-10-13 DIAGNOSIS — Z823 Family history of stroke: Secondary | ICD-10-CM

## 2023-10-13 DIAGNOSIS — R509 Fever, unspecified: Secondary | ICD-10-CM | POA: Diagnosis not present

## 2023-10-13 HISTORY — DX: Sepsis due to Escherichia coli (e. coli): A41.51

## 2023-10-13 LAB — HEMOGLOBIN A1C
Hgb A1c MFr Bld: 5 % (ref 4.8–5.6)
Mean Plasma Glucose: 96.8 mg/dL

## 2023-10-13 LAB — I-STAT VENOUS BLOOD GAS, ED
Acid-base deficit: 8 mmol/L — ABNORMAL HIGH (ref 0.0–2.0)
Bicarbonate: 25.5 mmol/L (ref 20.0–28.0)
Calcium, Ion: 1.12 mmol/L — ABNORMAL LOW (ref 1.15–1.40)
HCT: 39 % (ref 36.0–46.0)
Hemoglobin: 13.3 g/dL (ref 12.0–15.0)
O2 Saturation: 57 %
Potassium: 3.9 mmol/L (ref 3.5–5.1)
Sodium: 142 mmol/L (ref 135–145)
TCO2: 29 mmol/L (ref 22–32)
pCO2, Ven: 100.1 mmHg (ref 44–60)
pH, Ven: 7.014 — CL (ref 7.25–7.43)
pO2, Ven: 46 mmHg — ABNORMAL HIGH (ref 32–45)

## 2023-10-13 LAB — BASIC METABOLIC PANEL
Anion gap: 8 (ref 5–15)
BUN: 9 mg/dL (ref 8–23)
CO2: 24 mmol/L (ref 22–32)
Calcium: 8.8 mg/dL — ABNORMAL LOW (ref 8.9–10.3)
Chloride: 105 mmol/L (ref 98–111)
Creatinine, Ser: 0.87 mg/dL (ref 0.44–1.00)
GFR, Estimated: >60 mL/min (ref >60)
Glucose, Bld: 219 mg/dL — ABNORMAL HIGH (ref 70–99)
Potassium: 4 mmol/L (ref 3.5–5.1)
Sodium: 137 mmol/L (ref 135–145)

## 2023-10-13 LAB — POCT I-STAT 7, (LYTES, BLD GAS, ICA,H+H)
Acid-base deficit: 2 mmol/L (ref 0.0–2.0)
Bicarbonate: 24.8 mmol/L (ref 20.0–28.0)
Calcium, Ion: 1.15 mmol/L (ref 1.15–1.40)
HCT: 33 % — ABNORMAL LOW (ref 36.0–46.0)
Hemoglobin: 11.2 g/dL — ABNORMAL LOW (ref 12.0–15.0)
O2 Saturation: 94 %
Patient temperature: 97.6
Potassium: 4.1 mmol/L (ref 3.5–5.1)
Sodium: 140 mmol/L (ref 135–145)
TCO2: 26 mmol/L (ref 22–32)
pCO2 arterial: 47.7 mmHg (ref 32–48)
pH, Arterial: 7.321 — ABNORMAL LOW (ref 7.35–7.45)
pO2, Arterial: 76 mmHg — ABNORMAL LOW (ref 83–108)

## 2023-10-13 LAB — CBC WITH DIFFERENTIAL/PLATELET
Abs Immature Granulocytes: 0.02 10*3/uL (ref 0.00–0.07)
Basophils Absolute: 0 10*3/uL (ref 0.0–0.1)
Basophils Relative: 1 %
Eosinophils Absolute: 0.3 10*3/uL (ref 0.0–0.5)
Eosinophils Relative: 5 %
HCT: 37.6 % (ref 36.0–46.0)
Hemoglobin: 12.2 g/dL (ref 12.0–15.0)
Immature Granulocytes: 0 %
Lymphocytes Relative: 5 %
Lymphs Abs: 0.3 10*3/uL — ABNORMAL LOW (ref 0.7–4.0)
MCH: 34.7 pg — ABNORMAL HIGH (ref 26.0–34.0)
MCHC: 32.4 g/dL (ref 30.0–36.0)
MCV: 106.8 fL — ABNORMAL HIGH (ref 80.0–100.0)
Monocytes Absolute: 0.4 10*3/uL (ref 0.1–1.0)
Monocytes Relative: 5 %
Neutro Abs: 5.8 10*3/uL (ref 1.7–7.7)
Neutrophils Relative %: 84 %
Platelets: 138 10*3/uL — ABNORMAL LOW (ref 150–400)
RBC: 3.52 MIL/uL — ABNORMAL LOW (ref 3.87–5.11)
RDW: 13.5 % (ref 11.5–15.5)
WBC: 6.9 10*3/uL (ref 4.0–10.5)
nRBC: 0 % (ref 0.0–0.2)

## 2023-10-13 LAB — I-STAT ARTERIAL BLOOD GAS, ED
Acid-base deficit: 6 mmol/L — ABNORMAL HIGH (ref 0.0–2.0)
Bicarbonate: 25.1 mmol/L (ref 20.0–28.0)
Calcium, Ion: 1.18 mmol/L (ref 1.15–1.40)
HCT: 38 % (ref 36.0–46.0)
Hemoglobin: 12.9 g/dL (ref 12.0–15.0)
O2 Saturation: 100 %
Patient temperature: 98.3
Potassium: 4.3 mmol/L (ref 3.5–5.1)
Sodium: 139 mmol/L (ref 135–145)
TCO2: 27 mmol/L (ref 22–32)
pCO2 arterial: 75.2 mmHg (ref 32–48)
pH, Arterial: 7.131 — CL (ref 7.35–7.45)
pO2, Arterial: 481 mmHg — ABNORMAL HIGH (ref 83–108)

## 2023-10-13 LAB — CBG MONITORING, ED
Glucose-Capillary: 311 mg/dL — ABNORMAL HIGH (ref 70–99)
Glucose-Capillary: 259 mg/dL — ABNORMAL HIGH (ref 70–99)

## 2023-10-13 LAB — COMPREHENSIVE METABOLIC PANEL
ALT: 27 U/L (ref 0–44)
AST: 58 U/L — ABNORMAL HIGH (ref 15–41)
Albumin: 3.4 g/dL — ABNORMAL LOW (ref 3.5–5.0)
Alkaline Phosphatase: 69 U/L (ref 38–126)
Anion gap: 20 — ABNORMAL HIGH (ref 5–15)
BUN: 11 mg/dL (ref 8–23)
CO2: 21 mmol/L — ABNORMAL LOW (ref 22–32)
Calcium: 8.7 mg/dL — ABNORMAL LOW (ref 8.9–10.3)
Chloride: 101 mmol/L (ref 98–111)
Creatinine, Ser: 1.34 mg/dL — ABNORMAL HIGH (ref 0.44–1.00)
GFR, Estimated: 41 mL/min — ABNORMAL LOW (ref >60)
Glucose, Bld: 306 mg/dL — ABNORMAL HIGH (ref 70–99)
Potassium: 3.9 mmol/L (ref 3.5–5.1)
Sodium: 142 mmol/L (ref 135–145)
Total Bilirubin: 0.9 mg/dL (ref 0.0–1.2)
Total Protein: 6.6 g/dL (ref 6.5–8.1)

## 2023-10-13 LAB — TROPONIN I (HIGH SENSITIVITY)
Troponin I (High Sensitivity): 484 ng/L (ref <18)
Troponin I (High Sensitivity): 3871 ng/L (ref <18)

## 2023-10-13 LAB — RESP PANEL BY RT-PCR (RSV, FLU A&B, COVID)  RVPGX2
Influenza A by PCR: POSITIVE — AB
Influenza B by PCR: NEGATIVE
Resp Syncytial Virus by PCR: NEGATIVE
SARS Coronavirus 2 by RT PCR: NEGATIVE

## 2023-10-13 LAB — D-DIMER, QUANTITATIVE: D-Dimer, Quant: 0.61 ug{FEU}/mL — ABNORMAL HIGH (ref 0.00–0.50)

## 2023-10-13 LAB — BRAIN NATRIURETIC PEPTIDE
B Natriuretic Peptide: 205.1 pg/mL — ABNORMAL HIGH (ref 0.0–100.0)
B Natriuretic Peptide: 1372.6 pg/mL — ABNORMAL HIGH (ref 0.0–100.0)

## 2023-10-13 LAB — LACTIC ACID, PLASMA
Lactic Acid, Venous: 6.3 mmol/L (ref 0.5–1.9)
Lactic Acid, Venous: 6.1 mmol/L (ref 0.5–1.9)

## 2023-10-13 LAB — GLUCOSE, CAPILLARY
Glucose-Capillary: 239 mg/dL — ABNORMAL HIGH (ref 70–99)
Glucose-Capillary: 273 mg/dL — ABNORMAL HIGH (ref 70–99)

## 2023-10-13 LAB — I-STAT CG4 LACTIC ACID, ED: Lactic Acid, Venous: 11 mmol/L (ref 0.5–1.9)

## 2023-10-13 LAB — MRSA NEXT GEN BY PCR, NASAL: MRSA by PCR Next Gen: NOT DETECTED

## 2023-10-13 LAB — MAGNESIUM: Magnesium: 2.1 mg/dL (ref 1.7–2.4)

## 2023-10-13 MED ORDER — ATENOLOL 25 MG PO TABS
100.0000 mg | ORAL_TABLET | Freq: Once | ORAL | Status: AC
  Administered 2023-10-13: 100 mg via ORAL
  Filled 2023-10-13: qty 4

## 2023-10-13 MED ORDER — BUDESONIDE 0.5 MG/2ML IN SUSP
0.5000 mg | Freq: Two times a day (BID) | RESPIRATORY_TRACT | Status: DC
  Administered 2023-10-13 – 2023-10-24 (×21): 0.5 mg via RESPIRATORY_TRACT
  Filled 2023-10-13 (×23): qty 2

## 2023-10-13 MED ORDER — PANTOPRAZOLE SODIUM 40 MG IV SOLR
40.0000 mg | Freq: Every day | INTRAVENOUS | Status: DC
  Administered 2023-10-14 – 2023-10-16 (×3): 40 mg via INTRAVENOUS
  Filled 2023-10-13 (×3): qty 10

## 2023-10-13 MED ORDER — OSELTAMIVIR PHOSPHATE 30 MG PO CAPS
30.0000 mg | ORAL_CAPSULE | Freq: Two times a day (BID) | ORAL | Status: DC
  Filled 2023-10-13: qty 1

## 2023-10-13 MED ORDER — MAGNESIUM SULFATE 2 GM/50ML IV SOLN: 2.0000 g | Freq: Once | INTRAVENOUS | Status: DC

## 2023-10-13 MED ORDER — METHYLPREDNISOLONE SODIUM SUCC 125 MG IJ SOLR: 125.0000 mg | Freq: Once | INTRAMUSCULAR | Status: DC

## 2023-10-13 MED ORDER — LACTATED RINGERS IV BOLUS
1000.0000 mL | Freq: Once | INTRAVENOUS | Status: AC
Start: 2023-10-13 — End: 2023-10-13
  Administered 2023-10-13: 1000 mL via INTRAVENOUS

## 2023-10-13 MED ORDER — LACTATED RINGERS IV BOLUS
1000.0000 mL | Freq: Once | INTRAVENOUS | Status: AC
  Administered 2023-10-13: 1000 mL via INTRAVENOUS

## 2023-10-13 MED ORDER — ETOMIDATE 2 MG/ML IV SOLN
INTRAVENOUS | Status: DC | PRN
  Administered 2023-10-13: 20 mg via INTRAVENOUS
  Administered 2023-10-13: 19 mg via INTRAVENOUS

## 2023-10-13 MED ORDER — LACTATED RINGERS IV BOLUS: 1000.0000 mL | Freq: Once | INTRAVENOUS | Status: DC

## 2023-10-13 MED ORDER — INSULIN ASPART 100 UNIT/ML IJ SOLN
0.0000 [IU] | INTRAMUSCULAR | Status: DC
  Administered 2023-10-13: 5 [IU] via SUBCUTANEOUS
  Administered 2023-10-13: 8 [IU] via SUBCUTANEOUS
  Administered 2023-10-14: 3 [IU] via SUBCUTANEOUS
  Administered 2023-10-14: 8 [IU] via SUBCUTANEOUS
  Administered 2023-10-14 (×2): 3 [IU] via SUBCUTANEOUS
  Administered 2023-10-14: 2 [IU] via SUBCUTANEOUS
  Administered 2023-10-14 – 2023-10-15 (×3): 3 [IU] via SUBCUTANEOUS
  Administered 2023-10-15: 5 [IU] via SUBCUTANEOUS
  Administered 2023-10-15 (×2): 3 [IU] via SUBCUTANEOUS
  Administered 2023-10-15: 8 [IU] via SUBCUTANEOUS
  Administered 2023-10-16: 5 [IU] via SUBCUTANEOUS
  Administered 2023-10-16: 8 [IU] via SUBCUTANEOUS
  Administered 2023-10-16: 3 [IU] via SUBCUTANEOUS
  Administered 2023-10-16: 2 [IU] via SUBCUTANEOUS
  Administered 2023-10-16 – 2023-10-17 (×2): 3 [IU] via SUBCUTANEOUS
  Administered 2023-10-17: 2 [IU] via SUBCUTANEOUS

## 2023-10-13 MED ORDER — PREDNISONE 20 MG PO TABS: 40.0000 mg | ORAL_TABLET | Freq: Every day | ORAL | Status: DC

## 2023-10-13 MED ORDER — NOREPINEPHRINE 4 MG/250ML-% IV SOLN
0.0000 ug/min | INTRAVENOUS | Status: DC
  Administered 2023-10-13: 4 ug/min via INTRAVENOUS
  Administered 2023-10-14: 3 ug/min via INTRAVENOUS
  Administered 2023-10-16: 1 ug/min via INTRAVENOUS
  Filled 2023-10-13 (×2): qty 250

## 2023-10-13 MED ORDER — FENTANYL CITRATE PF 50 MCG/ML IJ SOSY: 25.0000 ug | PREFILLED_SYRINGE | INTRAMUSCULAR | Status: DC | PRN

## 2023-10-13 MED ORDER — SODIUM BICARBONATE 8.4 % IV SOLN
50.0000 meq | Freq: Once | INTRAVENOUS | Status: AC
  Administered 2023-10-13: 50 meq via INTRAVENOUS
  Filled 2023-10-13: qty 50

## 2023-10-13 MED ORDER — POLYETHYLENE GLYCOL 3350 17 G PO PACK: 17.0000 g | PACK | Freq: Every day | ORAL | Status: DC | PRN

## 2023-10-13 MED ORDER — SODIUM CHLORIDE 0.9 % IV SOLN
2.0000 g | INTRAVENOUS | Status: AC
Start: 2023-10-14 — End: 2023-10-17
  Administered 2023-10-14 – 2023-10-17 (×5): 2 g via INTRAVENOUS
  Filled 2023-10-13 (×5): qty 20

## 2023-10-13 MED ORDER — VASOPRESSIN 20 UNITS/100 ML INFUSION FOR SHOCK
0.0000 [IU]/min | INTRAVENOUS | Status: DC
  Filled 2023-10-13: qty 100

## 2023-10-13 MED ORDER — OSELTAMIVIR PHOSPHATE 30 MG PO CAPS
30.0000 mg | ORAL_CAPSULE | Freq: Every day | ORAL | Status: DC
  Administered 2023-10-13: 30 mg
  Filled 2023-10-13 (×2): qty 1

## 2023-10-13 MED ORDER — ORAL CARE MOUTH RINSE
15.0000 mL | OROMUCOSAL | Status: DC
  Administered 2023-10-13 – 2023-10-16 (×33): 15 mL via OROMUCOSAL

## 2023-10-13 MED ORDER — FENTANYL BOLUS VIA INFUSION: 25.0000 ug | INTRAVENOUS | Status: DC | PRN

## 2023-10-13 MED ORDER — FENTANYL 2500MCG IN NS 250ML (10MCG/ML) PREMIX INFUSION
25.0000 ug/h | INTRAVENOUS | Status: DC
Start: 2023-10-13 — End: 2023-10-13
  Administered 2023-10-13: 25 ug/h via INTRAVENOUS
  Filled 2023-10-13: qty 250

## 2023-10-13 MED ORDER — ENOXAPARIN SODIUM 40 MG/0.4ML IJ SOSY
40.0000 mg | PREFILLED_SYRINGE | INTRAMUSCULAR | Status: DC
  Administered 2023-10-13: 40 mg via SUBCUTANEOUS
  Filled 2023-10-13: qty 0.4

## 2023-10-13 MED ORDER — POLYETHYLENE GLYCOL 3350 17 G PO PACK
17.0000 g | PACK | Freq: Every day | ORAL | Status: DC
  Administered 2023-10-14 – 2023-10-16 (×3): 17 g
  Filled 2023-10-13 (×3): qty 1

## 2023-10-13 MED ORDER — METHYLPREDNISOLONE SODIUM SUCC 40 MG IJ SOLR
40.0000 mg | Freq: Two times a day (BID) | INTRAMUSCULAR | Status: DC
  Administered 2023-10-14 – 2023-10-15 (×3): 40 mg via INTRAVENOUS
  Filled 2023-10-13 (×3): qty 1

## 2023-10-13 MED ORDER — EPINEPHRINE 1 MG/10ML IJ SOSY
PREFILLED_SYRINGE | INTRAMUSCULAR | Status: DC | PRN
  Administered 2023-10-13 (×2): 1 mg via INTRAVENOUS

## 2023-10-13 MED ORDER — DOCUSATE SODIUM 50 MG/5ML PO LIQD
100.0000 mg | Freq: Two times a day (BID) | ORAL | Status: DC
  Administered 2023-10-13 – 2023-10-16 (×6): 100 mg
  Filled 2023-10-13 (×6): qty 10

## 2023-10-13 MED ORDER — CHLORHEXIDINE GLUCONATE CLOTH 2 % EX PADS
6.0000 | MEDICATED_PAD | Freq: Every day | CUTANEOUS | Status: DC
  Administered 2023-10-13 – 2023-10-20 (×8): 6 via TOPICAL

## 2023-10-13 MED ORDER — ACETAMINOPHEN 325 MG PO TABS: 650.0000 mg | ORAL_TABLET | Freq: Four times a day (QID) | ORAL | Status: DC | PRN

## 2023-10-13 MED ORDER — PROPOFOL 1000 MG/100ML IV EMUL
0.0000 ug/kg/min | INTRAVENOUS | Status: DC
  Administered 2023-10-13: 5 ug/kg/min via INTRAVENOUS
  Administered 2023-10-14: 25 ug/kg/min via INTRAVENOUS
  Administered 2023-10-14 – 2023-10-15 (×2): 35 ug/kg/min via INTRAVENOUS
  Administered 2023-10-15: 30 ug/kg/min via INTRAVENOUS
  Administered 2023-10-15: 35 ug/kg/min via INTRAVENOUS
  Administered 2023-10-15: 25 ug/kg/min via INTRAVENOUS
  Administered 2023-10-16: 35 ug/kg/min via INTRAVENOUS
  Filled 2023-10-13 (×6): qty 100

## 2023-10-13 MED ORDER — REVEFENACIN 175 MCG/3ML IN SOLN
175.0000 ug | Freq: Every day | RESPIRATORY_TRACT | Status: DC
  Administered 2023-10-13 – 2023-10-16 (×4): 175 ug via RESPIRATORY_TRACT
  Filled 2023-10-13 (×5): qty 3

## 2023-10-13 MED ORDER — ASPIRIN 81 MG PO TBEC
81.0000 mg | DELAYED_RELEASE_TABLET | Freq: Every day | ORAL | Status: DC
  Administered 2023-10-14: 81 mg via ORAL
  Filled 2023-10-13 (×2): qty 1

## 2023-10-13 MED ORDER — ORAL CARE MOUTH RINSE: 15.0000 mL | OROMUCOSAL | Status: DC | PRN

## 2023-10-13 MED ORDER — IPRATROPIUM-ALBUTEROL 0.5-2.5 (3) MG/3ML IN SOLN
3.0000 mL | RESPIRATORY_TRACT | Status: DC | PRN
  Administered 2023-10-13: 3 mL via RESPIRATORY_TRACT
  Filled 2023-10-13: qty 3
  Filled 2023-10-13: qty 9
  Filled 2023-10-13: qty 3

## 2023-10-13 MED ORDER — INSULIN ASPART 100 UNIT/ML IJ SOLN
0.0000 [IU] | Freq: Three times a day (TID) | INTRAMUSCULAR | Status: DC
  Administered 2023-10-13: 5 [IU] via SUBCUTANEOUS

## 2023-10-13 MED ORDER — MIDAZOLAM HCL 2 MG/2ML IJ SOLN: 1.0000 mg | INTRAMUSCULAR | Status: DC | PRN

## 2023-10-13 MED ORDER — METHYLPREDNISOLONE SODIUM SUCC 40 MG IJ SOLR: 40.0000 mg | Freq: Two times a day (BID) | INTRAMUSCULAR | Status: DC

## 2023-10-13 MED ORDER — ATENOLOL 25 MG PO TABS: 100.0000 mg | ORAL_TABLET | Freq: Two times a day (BID) | ORAL | Status: DC

## 2023-10-13 MED ORDER — SODIUM CHLORIDE 0.9% FLUSH
3.0000 mL | Freq: Two times a day (BID) | INTRAVENOUS | Status: DC
  Administered 2023-10-13 – 2023-11-11 (×50): 3 mL via INTRAVENOUS

## 2023-10-13 MED ORDER — PROPOFOL 1000 MG/100ML IV EMUL
0.0000 ug/kg/min | INTRAVENOUS | Status: DC
  Administered 2023-10-13: 5 ug/kg/min via INTRAVENOUS
  Filled 2023-10-13: qty 100

## 2023-10-13 MED ORDER — IPRATROPIUM-ALBUTEROL 0.5-2.5 (3) MG/3ML IN SOLN
3.0000 mL | Freq: Once | RESPIRATORY_TRACT | Status: AC
  Administered 2023-10-13: 3 mL via RESPIRATORY_TRACT
  Filled 2023-10-13: qty 3

## 2023-10-13 MED ORDER — ACETAMINOPHEN 650 MG RE SUPP: 650.0000 mg | Freq: Four times a day (QID) | RECTAL | Status: DC | PRN

## 2023-10-13 MED ORDER — ARFORMOTEROL TARTRATE 15 MCG/2ML IN NEBU
15.0000 ug | INHALATION_SOLUTION | Freq: Two times a day (BID) | RESPIRATORY_TRACT | Status: DC
  Administered 2023-10-13 – 2023-10-16 (×7): 15 ug via RESPIRATORY_TRACT
  Filled 2023-10-13 (×8): qty 2

## 2023-10-13 MED ORDER — METHYLPREDNISOLONE SODIUM SUCC 125 MG IJ SOLR
125.0000 mg | Freq: Once | INTRAMUSCULAR | Status: AC
Start: 2023-10-13 — End: 2023-10-13
  Administered 2023-10-13: 125 mg via INTRAVENOUS
  Filled 2023-10-13: qty 2

## 2023-10-13 MED ORDER — SODIUM BICARBONATE 8.4 % IV SOLN
INTRAVENOUS | Status: AC | PRN
  Administered 2023-10-13: 50 meq via INTRAVENOUS

## 2023-10-13 MED ORDER — OSELTAMIVIR PHOSPHATE 75 MG PO CAPS
75.0000 mg | ORAL_CAPSULE | Freq: Once | ORAL | Status: AC
  Administered 2023-10-13: 75 mg via ORAL
  Filled 2023-10-13: qty 1

## 2023-10-13 MED ORDER — METRONIDAZOLE 500 MG/100ML IV SOLN: 500.0000 mg | Freq: Two times a day (BID) | INTRAVENOUS | Status: DC

## 2023-10-13 MED ORDER — ROCURONIUM BROMIDE 10 MG/ML (PF) SYRINGE
PREFILLED_SYRINGE | INTRAVENOUS | Status: AC | PRN
  Administered 2023-10-13: 80 mg via INTRAVENOUS
  Administered 2023-10-13: 5 mg via INTRAVENOUS
  Administered 2023-10-13: 50 mg via INTRAVENOUS

## 2023-10-13 NOTE — Progress Notes (Signed)
 Interval PCCM Progress Note  Notified by ICU staff that patient was hypotensive in route from ED to MICU. SBP in the 60s   P: Placed orders for 1 Liter bolus Placed order for levophed  Asked ICU staff to obtain CVC and Aline set up for emergent placement  During placement of lines, patient fluid responsive. Once fluid bolus stopped, blood pressure began to become hypotensive one again. P: Ordered for additional LR bolus  Beside POCUS revealed septal thickening, LVH which noted on previous ECHO on 06/07/21-basilar septal wall thickening, left ventricular hypertrophy, EF 60-65%  Hyperdynamic function noted per POCUS  Complete ECHO results pending  BP improving with fluid resuscitation  Obtain CVP Place foley to monitor urine output    Left Pneumothorax   Chest X-ray Post Intubation/CPR- revealed left pneumothorax,~10% per report   Chest X-ray after right R internal jugular CVC placed, revealed increase size in pnemothorax on left  Notified by Radiologist CCM MD Clelia Croft notified  P: Consent obtained for emergent chest tube by Husband of patient  Placed left pig tail chest tube for left tension pneumothorax  -Patient stable, post chest x-ray showing left lung reinflated   [name] AGACNP-BC   [facility] 10/13/2023, 11:48 PM  Please see [email or web address] for pager details.  From 7A-7P if no response, please call [phone]. After hours, please call ELink [phone].

## 2023-10-13 NOTE — ED Notes (Signed)
 Patient ambulated to restroom with NT and RN. Patient's O2 sat between 97%-100% on room air.

## 2023-10-13 NOTE — H&P (Addendum)
 History and Physical   Anita Mccormick WUJ:[id number] DOB: 1946-06-28 DOA: 10/13/2023  PCP: Deeann Saint, MD   Patient coming from: Home  Chief Complaint: Shortness of breath  HPI: Anita Mccormick is a 78 y.o. female with medical history significant of hypertension, hyperlipidemia, CVA, diabetes, CKD 2, chronic diastolic CHF, anemia, breast cancer, nonalcoholic fatty liver disease, cirrhosis, smoking presenting with worsening shortness of breath.  Patient has had some intermittent issues with cough and shortness of breath for the past month.  Initially, a month ago, she had a cough and shortness of breath and she was diagnosed with COVID.  This cough this cough continued for another 1 to 2 weeks and she was ultimately prescribed a 7-day course of p.o. antibiotics by her PCP.  She completed this course and had improvement but some persistent cough.  Since that time she has developed some worsening shortness of breath, orthopnea and wheezing.  EMS was called to the house today and patient was noted to be hypoxic and required supplemental oxygen.  She also received albuterol nebulization and route which significantly helped her wheezing per patient.  Patient denies fevers, chills, chest pain, abdominal pain, constipation, diarrhea, nausea, vomiting.  ED Course: Vital signs in the ED notable for blood pressure in the 130s to 200 systolic, heart rate in the 90s to 100s, respiratory rate in the 20s to 30s, requiring 2 L to maintain saturations.  Lab workup in the ED included BMP with glucose 219, calcium 8.8.  CBC significant for platelets of 138 which is stable.  D-dimer 0.61 which is negative when age-adjusted.  BNP indeterminate at 205.  Respiratory panel positive for flu A.  Chest x-ray showed no acute abnormality.  Patient received Tamiflu, DuoNeb, atenolol in the ED.  Review of Systems: As per HPI otherwise all other systems reviewed and are negative.  Past Medical History:  Diagnosis Date    Arthritis    Cancer (HCC)    breast left   Cerebrovascular accident [facility])    October 29 2021- Left eye- blind   COLONIC POLYPS, HX OF 12/10/2006   Qualifier: Diagnosis of  By: Cato Mulligan MD, Bruce     Diabetes mellitus type II, controlled (HCC) 12/10/2006   Poor control on Januvia 100mg  and glipizide 10mg  XL Lab Results  Component Value Date   HGBA1C 8.3* 11/02/2014        Eczema    Fatty liver disease, nonalcoholic 07/14/2014   Per Dr. Cato Mulligan Lab Results  Component Value Date   ALT 31 11/02/2014   AST 57* 11/02/2014   ALKPHOS 104 11/02/2014   BILITOT 1.1 11/02/2014       GERD (gastroesophageal reflux disease)    History of blood transfusion    History of kidney stones 2023   11/28/21 small stone currently, taking flomax   Hypertension    Iron deficiency anemia, unspecified 05/08/2023   Sepsis (HCC) 09/19/2017   Sepsis due to Escherichia coli (E. coli) (HCC)    Symptomatic anemia 06/06/2021   UTI (urinary tract infection) 09/19/2017   Vaginal discharge 04/01/2022    Past Surgical History:  Procedure Laterality Date   ARTERY BIOPSY Left 11/29/2021   Procedure: LEFT BIOPSY TEMPORAL ARTERY;  Surgeon: Chuck Hint, MD;  Location: [facility] OR;  Service: Vascular;  Laterality: Left;   CATARACT EXTRACTION Bilateral    MASTECTOMY Left    TUBAL LIGATION      Social History  reports that she has been smoking cigarettes. She has a 2  pack-year smoking history. She has never used smokeless tobacco. She reports that she does not currently use alcohol. She reports that she does not use drugs.  Allergies  Allergen Reactions   Ace Inhibitors Cough    ????   Bee Venom Swelling   Chlorthalidone     REACTION: unspecified   Metformin     REACTION: gi side effects   Penicillins     REACTION: urticaria (hives)   Sulfamethoxazole     REACTION: questionable    Family History  Problem Relation Age of Onset   Stroke Mother    Cancer Father        prostate   Diabetes Sister    Pancreatic  cancer Sister   Reviewed on admission  Prior to Admission medications   Medication Sig Start Date End Date Taking? Authorizing Provider  aspirin EC 81 MG tablet Take 81 mg by mouth daily. Swallow whole.    [provider]  atenolol (TENORMIN) 100 MG tablet TAKE 1 TABLET BY MOUTH TWICE A DAY 04/27/23   Deeann Saint, MD  benzonatate (TESSALON) 100 MG capsule Take 1 capsule (100 mg total) by mouth 2 (two) times daily as needed for cough. 08/26/23   Deeann Saint, MD  blood glucose meter kit and supplies KIT Dispense based on patient and insurance preference. Use up to four times daily as directed. 06/05/21   Deeann Saint, MD  Blood Glucose Monitoring Suppl [facility]) w/Device KIT Test twice daily (onetouch ultra) 11/04/17   Deeann Saint, MD  Blood Pressure Monitoring (BLOOD PRESSURE CUFF) MISC Use daily to monitor blood pressue 11/04/17   Deeann Saint, MD  Continuous Blood Gluc Receiver (DEXCOM G7 RECEIVER) DEVI 1 Device by Does not apply route as directed. 10/29/22   Deeann Saint, MD  Continuous Glucose Sensor (DEXCOM G7 SENSOR) MISC Apply a new sensor to skin every 10 days. 04/23/23   Deeann Saint, MD  Continuous Glucose Sensor (DEXCOM G7 SENSOR) MISC 1 each by Does not apply route as directed. 09/23/23   Deeann Saint, MD  Dulaglutide (TRULICITY) 1.5 MG/0.5ML SOPN Inject 1.5 mg into the skin once a week. 04/23/23   Deeann Saint, MD  ferrous gluconate (FERGON) 324 MG tablet Take 1 tablet (324 mg total) by mouth daily. 04/23/23   Deeann Saint, MD  glipiZIDE (GLUCOTROL XL) 10 MG 24 hr tablet TAKE 1 TABLET BY MOUTH TWICE A DAY 09/28/23   Deeann Saint, MD  Lancets [facility] ULTRASOFT) lancets Use as instructed 07/07/22   Deeann Saint, MD  Multiple Vitamin (MULTIVITAMIN) tablet Take 1 tablet by mouth daily.    [provider]  naproxen sodium (ALEVE) 220 MG tablet Take 220 mg by mouth daily as needed.    [provider]  prednisoLONE  acetate (PRED FORTE) 1 % ophthalmic suspension  05/27/22   [provider]    Physical Exam: Vitals:   10/13/23 1137 10/13/23 1143 10/13/23 1158 10/13/23 1215  BP: (!) 204/88 (!) 204/88  (!) 202/88  Pulse: (!) 102 (!) 102  100  Resp: (!) 33   (!) 35  Temp:   98.5 F (36.9 C)   TempSrc:   Oral   SpO2: 98%   100%  Weight:      Height:        Physical Exam Constitutional:      General: She is not in acute distress.    Appearance: Normal appearance.  HENT:  Head: Normocephalic and atraumatic.     Mouth/Throat:     Mouth: Mucous membranes are moist.     Pharynx: Oropharynx is clear.  Eyes:     Extraocular Movements: Extraocular movements intact.     Pupils: Pupils are equal, round, and reactive to light.  Cardiovascular:     Rate and Rhythm: Regular rhythm. Tachycardia present.     Pulses: Normal pulses.     Heart sounds: Normal heart sounds.  Pulmonary:     Effort: Tachypnea present. No respiratory distress.     Breath sounds: Wheezing present.     Comments: Mild increase WOB Abdominal:     General: Bowel sounds are normal. There is no distension.     Palpations: Abdomen is soft.     Tenderness: There is no abdominal tenderness.  Musculoskeletal:        General: No swelling or deformity.  Skin:    General: Skin is warm and dry.  Neurological:     General: No focal deficit present.     Mental Status: Mental status is at baseline.    Labs on Admission: I have personally reviewed following labs and imaging studies  CBC: Recent Labs  Lab 10/13/23 0944  WBC 6.9  NEUTROABS 5.8  HGB 12.2  HCT 37.6  MCV 106.8*  PLT 138*    Basic Metabolic Panel: Recent Labs  Lab 10/13/23 0944  NA 137  K 4.0  CL 105  CO2 24  GLUCOSE 219*  BUN 9  CREATININE 0.87  CALCIUM 8.8*    GFR: Estimated Creatinine Clearance: 49.4 mL/min (by C-G formula based on SCr of 0.87 mg/dL).  Liver Function Tests: No results for input(s): "AST", "ALT", "ALKPHOS", "BILITOT",  "PROT", "ALBUMIN" in the last 168 hours.  Urine analysis:    Component Value Date/Time   COLORURINE STRAW (A) 11/16/2017 2033   APPEARANCEUR CLEAR 11/16/2017 2033   LABSPEC 1.010 11/16/2017 2033   PHURINE 7.0 11/16/2017 2033   GLUCOSEU >=500 (A) 11/16/2017 2033   GLUCOSEU >=1000 (A) 08/04/2017 1622   HGBUR NEGATIVE 11/16/2017 2033   BILIRUBINUR neg 10/25/2021 1350   KETONESUR NEGATIVE 11/16/2017 2033   PROTEINUR Negative 10/25/2021 1350   PROTEINUR NEGATIVE 11/16/2017 2033   UROBILINOGEN negative (A) 10/25/2021 1350   UROBILINOGEN 0.2 08/04/2017 1622   NITRITE neg 10/25/2021 1350   NITRITE NEGATIVE 11/16/2017 2033   LEUKOCYTESUR Trace (A) 10/25/2021 1350    Radiological Exams on Admission: DG Chest 2 View Result Date: 10/13/2023 CLINICAL DATA:  Shortness of breath.  Cough EXAM: CHEST - 2 VIEW COMPARISON:  X-ray 07/06/2023. FINDINGS: No consolidation, pneumothorax or effusion. Normal cardiopericardial silhouette. Calcified aorta. Overlapping cardiac leads. Degenerative changes seen along the spine. The extreme inferior posterior costophrenic angles are clipped off the edge of the film on the lateral view. IMPRESSION: No acute cardiopulmonary disease. Electronically Signed   By: Karen Kays M.D.   On: 10/13/2023 11:06   EKG: Independently reviewed.  Sinus tachycardia at 109 bpm.  Nonspecific T wave changes.  Low voltage multiple leads.  Assessment/Plan Principal Problem:   Acute respiratory failure with hypoxia (HCC) Active Problems:   Type II diabetes mellitus with renal manifestations (HCC)   Hyperlipidemia   Essential hypertension   History of CVA (cerebrovascular accident)   History of breast cancer   Fatty liver disease, nonalcoholic   Chronic kidney disease (CKD), stage II (mild)   Chronic diastolic CHF (congestive heart failure) (HCC)   Anemia   Cirrhosis of liver without ascites (HCC)  Influenza A   Acute respiratory failure with hypoxia Influenza A > Patient  presenting with worsening shortness of breath.  Previously has had COVID-19 and treated with a course of p.o. antibiotics for possible superimposed pneumonia. > Has subsequently developed shortness of breath, wheezing, orthopnea again.  Positive for influenza A in the ED.  Chest x-ray without acute abnormality.  D-dimer negative when age-adjusted and BNP indeterminate. > Improvement in wheezing and symptoms with nebulizer treatments in the ED.  No diagnosis of COPD but does have smoking history. - Monitor on telemetry overnight - Continue with supplemental oxygen, wean as tolerated - Start steroids, Solumedrol today, prednisone tomorrow - Continue with Tamiflu - As needed DuoNebs - Supportive care  ADDENDUM                                                                      Syncope > Notified by RN that patient had episode of syncope after going to the bathroom.  She had return to her bed.  She reported a prodrome of warmth and feeling she was been to pass out prior to the episode. > No seizure-like activity.  Was somewhat slow to come around but by the time I was able to get to the room from a different part of the hospital patient was alert and oriented and back to herself.  Maybe some residual increased tachypnea. > She states that she has had similar episodes in the past every month or so.  Often associated when she goes to the bathroom.  This is not new for her and is unlikely secondary to her acute illness although she may be more prone to it with extra strain on her body.  Likely vasovagal. - Patient is being monitored on telemetry, was off cardiac monitoring during the event because she had just gotten up to go to the bathroom. - Will repeat echocardiogram since it has been several years since her last. - Orthostatic vital signs when able - Continue to monitor _____________________________________________  Hypertension > Some significantly elevated blood pressures in the ED.  Her  atenolol was restarted. - Continue home atenolol - Could add on as needed labetalol if needed  Hyperlipidemia - Statin intolerant and not currently on antihyperlipidemic  History of CVA - Continue home ASA  Diabetes - SSI  CKD 2 > Creatinine stable in the ED - Trend renal function and electrolytes  Chronic diastolic CHF > Last echo was in 2022 with EF 60-65%, G1 DD, normal RV function.  Not currently on a diuretic at home.  BNP indeterminate at 205.  No evidence of volume overload on exam or imaging. - Continue to monitor  Nonalcoholic fatty liver disease Cirrhosis > Cirrhosis appears to be imaging diagnosed.  Compensated. - Check LFTs  History of breast cancer - Noted  DVT prophylaxis: Lovenox Code Status:   Full Family Communication:  Updated at bedside  Disposition Plan:   Patient is from:  Home  Anticipated DC to:  Home  Anticipated DC date:  1 to 2 days  Anticipated DC barriers: None  Consults called:  None Admission status:  Observation, telemetry  Severity of Illness: The appropriate patient status for this patient is OBSERVATION. Observation status is judged to be reasonable and necessary  in order to provide the required intensity of service to ensure the patient's safety. The patient's presenting symptoms, physical exam findings, and initial radiographic and laboratory data in the context of their medical condition is felt to place them at decreased risk for further clinical deterioration. Furthermore, it is anticipated that the patient will be medically stable for discharge from the hospital within 2 midnights of admission.    Synetta Fail MD [facility]  How to contact the [facility] Attending or Consulting provider 7A - 7P or covering provider during after hours 7P -7A, for this patient?   Check the care team in [facility] and look for a) attending/consulting TR[facility] provider listed and b) the [facility] team listed Log into [email or web address] and use [place]'s  universal password to access. If you do not have the password, please contact the hospital operator. Locate the [facility] provider you are looking for under [facility] and page to a number that you can be directly reached. If you still have difficulty reaching the provider, please page the [facility] (Director on Call) for the Hospitalists listed on amion for assistance.  10/13/2023, 12:50 PM

## 2023-10-13 NOTE — ED Notes (Signed)
 IV team at bedside

## 2023-10-13 NOTE — Code Documentation (Addendum)
 MD preparing to intubate

## 2023-10-13 NOTE — ED Notes (Signed)
MD Floyd at bedside  

## 2023-10-13 NOTE — Progress Notes (Cosign Needed Addendum)
 Insertion of Chest Tube Procedure Note  Anita Mccormick  [id number]  Mar 31, 1946  Date:10/13/23  Time:11:37 PM    Provider Performing: Henrene Hawking   Procedure: Chest Tube Insertion [phone]) for Left Tension Pneumothorax Supervised by CCM MD Sherryll Burger and Theodis Sato. PCCM PA   Indication(s) Pneumothorax  Consent Risks of the procedure as well as the alternatives and risks of each were explained to the patient and/or caregiver.  Consent for the procedure was obtained and is signed in the bedside chart Patient's Husband and Son, obtained in waiting room.   Anesthesia Topical only with 1% lidocaine    Time Out Verified patient identification, verified procedure, site/side was marked, verified correct patient position, special equipment/implants available, medications/allergies/relevant history reviewed, required imaging and test results available.   Sterile Technique Maximal sterile technique including full sterile barrier drape, hand hygiene, sterile gown, sterile gloves, mask, hair covering, sterile ultrasound probe cover (if used).   Procedure Description Ultrasound not used to identify appropriate pleural anatomy for placement and overlying skin marked. Area of placement cleaned and draped in sterile fashion.  A 14 French pigtail pleural catheter was placed into the left pleural space using Seldinger technique. Appropriate return of air was obtained.  The tube was connected to atrium and placed on -20 cm H2O wall suction.   Complications/Tolerance None; patient tolerated the procedure well. Chest X-ray is ordered to verify placement.   EBL Minimal  Specimen(s) none  Christian Keeon Zurn AGACNP-BC    Pulmonary & Critical Care 10/13/2023, 11:38 PM  Please see [email or web address] for pager details.  From 7A-7P if no response, please call [phone]. After hours, please call ELink [phone].

## 2023-10-13 NOTE — Code Documentation (Addendum)
 IV infiltrated - additional meds to be given in IO in order to intubate

## 2023-10-13 NOTE — Procedures (Signed)
 Central Venous Catheter Insertion Procedure Note  Anita Mccormick  [id number]  [birth year]  Date:10/13/23  Time:9:12 PM     Provider Performing:Kemoni Quesenberry Erby Pian    Procedure: Insertion of Non-tunneled Central Venous Catheter(36556) with US guidance ([id number])   Indication(s) Medication administration and Difficult access  Consent Unable to obtain consent due to emergent nature of procedure.  Anesthesia Topical only with 1% lidocaine   Timeout Verified patient identification, verified procedure, site/side was marked, verified correct patient position, special equipment/implants available, medications/allergies/relevant history reviewed, required imaging and test results available.  Sterile Technique Maximal sterile technique including full sterile barrier drape, hand hygiene, sterile gown, sterile gloves, mask, hair covering, sterile ultrasound probe cover (if used).  Procedure Description Area of catheter insertion was cleaned with chlorhexidine and draped in sterile fashion.  With real-time ultrasound guidance a central venous catheter was placed into the right internal jugular vein. Nonpulsatile blood flow and easy flushing noted in all ports.  The catheter was sutured in place and sterile dressing applied.  Complications/Tolerance None; patient tolerated the procedure well. Chest X-ray is ordered to verify placement for internal jugular or subclavian cannulation.   Chest x-ray is not ordered for femoral cannulation.  EBL Minimal  Specimen(s) None   Anita Mccormick AGACNP-BC   [place] 10/13/2023, 9:12 PM  Please see [email or web address] for pager details.  From 7A-7P if no response, please call [phone]. After hours, please call ELink [phone].

## 2023-10-13 NOTE — ED Provider Notes (Signed)
 [place] EMERGENCY DEPARTMENT AT [facility] Provider Note   CSN: [id number] Arrival date & time: 10/13/23  6440     History  Chief Complaint  Patient presents with   Shortness of Breath   Cough    Anita Mccormick is a 78 y.o. female.  The history is provided by the patient, medical records and the EMS personnel. No language interpreter was used.  Shortness of Breath Associated symptoms: cough   Cough Associated symptoms: shortness of breath      Patient is a 78 y.o. female with a recent diagnosis of COVID-19 within the last month presenting to the ER today with complaints of SOB, cough, and wheezing. Per EMS, pt was hypoxic en route to the ER and received an albuterol nebulizer. Pt states that this helped incredibly with her wheezing. Patient states that about one month ago she began to experience a productive cough as well as SOB and she was diagnosed with COVID-19. Her productive cough remained for about 1-2 weeks resulting in her being prescribed abx by her PCP. Pt is unsure which abx she was taking but she states she completed her 7 day course of the prescription approx one week ago. Since the end of her abx regimen, pt states that her productive cough has now turned nonproductive. She endorses now experiencing wheezing, SOB. dyspnea and orthopnea. Patient is a current smoker, consuming about 2-3 cigarettes daily. She denies LE edema, fever, chills, N/V/D, chest pain, or palpitations.   Home Medications Prior to Admission medications   Medication Sig Start Date End Date Taking? Authorizing Provider  aspirin EC 81 MG tablet Take 81 mg by mouth daily. Swallow whole.    [provider]  atenolol (TENORMIN) 100 MG tablet TAKE 1 TABLET BY MOUTH TWICE A DAY 04/27/23   Deeann Saint, MD  benzonatate (TESSALON) 100 MG capsule Take 1 capsule (100 mg total) by mouth 2 (two) times daily as needed for cough. 08/26/23   Deeann Saint, MD  blood glucose meter kit and  supplies KIT Dispense based on patient and insurance preference. Use up to four times daily as directed. 06/05/21   Deeann Saint, MD  Blood Glucose Monitoring Suppl [facility] Charlies [place]) w/Device KIT Test twice daily (onetouch ultra) 11/04/17   Deeann Saint, MD  Blood Pressure Monitoring (BLOOD PRESSURE CUFF) MISC Use daily to monitor blood pressue 11/04/17   Deeann Saint, MD  Continuous Blood Gluc Receiver (DEXCOM G7 RECEIVER) DEVI 1 Device by Does not apply route as directed. 10/29/22   Deeann Saint, MD  Continuous Glucose Sensor (DEXCOM G7 SENSOR) MISC Apply a new sensor to skin every 10 days. 04/23/23   Deeann Saint, MD  Continuous Glucose Sensor (DEXCOM G7 SENSOR) MISC 1 each by Does not apply route as directed. 09/23/23   Deeann Saint, MD  Dulaglutide (TRULICITY) 1.5 MG/0.5ML SOPN Inject 1.5 mg into the skin once a week. 04/23/23   Deeann Saint, MD  ferrous gluconate (FERGON) 324 MG tablet Take 1 tablet (324 mg total) by mouth daily. 04/23/23   Deeann Saint, MD  glipiZIDE (GLUCOTROL XL) 10 MG 24 hr tablet TAKE 1 TABLET BY MOUTH TWICE A DAY 09/28/23   Deeann Saint, MD  Lancets [facility] ULTRASOFT) lancets Use as instructed 07/07/22   Deeann Saint, MD  Multiple Vitamin (MULTIVITAMIN) tablet Take 1 tablet by mouth daily.    [provider]  naproxen sodium (ALEVE) 220 MG tablet Take 220  mg by mouth daily as needed.    [provider]  prednisoLONE acetate (PRED FORTE) 1 % ophthalmic suspension  05/27/22   [provider]      Allergies    Ace inhibitors, Bee venom, Chlorthalidone, Metformin, Penicillins, and Sulfamethoxazole    Review of Systems   Review of Systems  Respiratory:  Positive for cough and shortness of breath.   All other systems reviewed and are negative.   Physical Exam Updated Vital Signs BP 134/74 (BP Location: Right Arm)   Pulse (!) 105   Temp 98.7 F (37.1 C) (Oral)   Resp 16   Ht 5\' 2"  (1.575 m)   Wt 67 kg    SpO2 100%   BMI 27.02 kg/m  Physical Exam Vitals and nursing note reviewed.  Constitutional:      General: She is not in acute distress.    Appearance: She is well-developed.  HENT:     Head: Atraumatic.  Eyes:     Conjunctiva/sclera: Conjunctivae normal.  Neck:     Vascular: No JVD.  Cardiovascular:     Rate and Rhythm: Tachycardia present.  Pulmonary:     Effort: Pulmonary effort is normal.     Breath sounds: Decreased breath sounds present.  Abdominal:     Palpations: Abdomen is soft.  Musculoskeletal:     Cervical back: Neck supple.     Right lower leg: No edema.     Left lower leg: No edema.  Skin:    Findings: No rash.  Neurological:     Mental Status: She is alert. Mental status is at baseline.  Psychiatric:        Mood and Affect: Mood normal.    ED Results / Procedures / Treatments   Labs (all labs ordered are listed, but only abnormal results are displayed) Labs Reviewed  RESP PANEL BY RT-PCR (RSV, FLU A&B, COVID)  RVPGX2 - Abnormal; Notable for the following components:      Result Value   Influenza A by PCR POSITIVE (*)    All other components within normal limits  BASIC METABOLIC PANEL - Abnormal; Notable for the following components:   Glucose, Bld 219 (*)    Calcium 8.8 (*)    All other components within normal limits  CBC WITH DIFFERENTIAL/PLATELET - Abnormal; Notable for the following components:   RBC 3.52 (*)    MCV 106.8 (*)    MCH 34.7 (*)    Platelets 138 (*)    Lymphs Abs 0.3 (*)    All other components within normal limits  BRAIN NATRIURETIC PEPTIDE - Abnormal; Notable for the following components:   B Natriuretic Peptide 205.1 (*)    All other components within normal limits  D-DIMER, QUANTITATIVE - Abnormal; Notable for the following components:   D-Dimer, Quant 0.61 (*)    All other components within normal limits    EKG EKG Interpretation Date/Time:  Tuesday October 13 2023 08:23:56 EDT Ventricular Rate:  109 PR Interval:     QRS Duration:  87 QT Interval:  359 QTC Calculation: 484 R Axis:   59  Text Interpretation: Sinus tachycardia Consider left ventricular hypertrophy Otherwise no significant change Confirmed by Melene Plan [phone]) on 10/13/2023 9:10:10 AM  Radiology DG Chest 2 View Result Date: 10/13/2023 CLINICAL DATA:  Shortness of breath.  Cough EXAM: CHEST - 2 VIEW COMPARISON:  X-ray 07/06/2023. FINDINGS: No consolidation, pneumothorax or effusion. Normal cardiopericardial silhouette. Calcified aorta. Overlapping cardiac leads. Degenerative changes seen along the spine.  The extreme inferior posterior costophrenic angles are clipped off the edge of the film on the lateral view. IMPRESSION: No acute cardiopulmonary disease. Electronically Signed   By: Karen Kays M.D.   On: 10/13/2023 11:06    Procedures .Critical Care  Performed by: Fayrene Helper, PA-C Authorized by: Fayrene Helper, PA-C   Critical care provider statement:    Critical care time (minutes):  33   Critical care was time spent personally by me on the following activities:  Development of treatment plan with patient or surrogate, discussions with consultants, evaluation of patient's response to treatment, examination of patient, ordering and review of laboratory studies, ordering and review of radiographic studies, ordering and performing treatments and interventions, pulse oximetry, re-evaluation of patient's condition and review of old charts     Medications Ordered in ED Medications  oseltamivir (TAMIFLU) capsule 30 mg (has no administration in time range)  atenolol (TENORMIN) tablet 100 mg (has no administration in time range)  aspirin EC tablet 81 mg (has no administration in time range)  enoxaparin (LOVENOX) injection 40 mg (40 mg Subcutaneous Given 10/13/23 1335)  sodium chloride flush (NS) 0.9 % injection 3 mL (3 mLs Intravenous Given 10/13/23 1336)  insulin aspart (novoLOG) injection 0-9 Units (has no administration in time range)   acetaminophen (TYLENOL) tablet 650 mg (has no administration in time range)    Or  acetaminophen (TYLENOL) suppository 650 mg (has no administration in time range)  polyethylene glycol (MIRALAX / GLYCOLAX) packet 17 g (has no administration in time range)  ipratropium-albuterol (DUONEB) 0.5-2.5 (3) MG/3ML nebulizer solution 3 mL (3 mLs Nebulization Given 10/13/23 1352)  predniSONE (DELTASONE) tablet 40 mg (has no administration in time range)  ipratropium-albuterol (DUONEB) 0.5-2.5 (3) MG/3ML nebulizer solution 3 mL (3 mLs Nebulization Given 10/13/23 1141)  oseltamivir (TAMIFLU) capsule 75 mg (75 mg Oral Given 10/13/23 1141)  atenolol (TENORMIN) tablet 100 mg (100 mg Oral Given 10/13/23 1143)  methylPREDNISolone sodium succinate (SOLU-MEDROL) 125 mg/2 mL injection 125 mg (125 mg Intravenous Given 10/13/23 1335)    ED Course/ Medical Decision Making/ A&P Clinical Course as of 10/13/23 1130  Tue Oct 13, 2023  1119 DG Chest 2 View [RT]    Clinical Course User Index [RT] [place], Jacqualine Code                                 Medical Decision Making Amount and/or Complexity of Data Reviewed Labs: ordered. Radiology: ordered.  Risk Prescription drug management.   BP 134/74 (BP Location: Right Arm)   Pulse (!) 105   Temp 98.7 F (37.1 C) (Oral)   Resp 16   Ht 5\' 2"  (1.575 m)   Wt 67 kg   SpO2 100%   BMI 27.02 kg/m   9:01 AM  Patient is a 78 y.o. female with a recent diagnosis of COVID-19 within the last month presenting to the ER today with complaints of SOB, cough, and wheezing. Per EMS, pt was hypoxic en route to the ER and received an albuterol nebulizer. Pt states that this helped incredibly with her wheezing. Patient states that about one month ago she began to experience a productive cough as well as SOB and she was diagnosed with COVID-19. Her productive cough remained for about 1-2 weeks resulting in her being prescribed abx by her PCP. Pt is unsure which abx she was  taking but she states she completed her 7 day course of  the prescription approx one week ago. Since the end of her abx regimen, pt states that her productive cough has now turned nonproductive. She endorses now experiencing wheezing, SOB. dyspnea and orthopnea. Patient is a current smoker, consuming about 2-3 cigarettes daily. She denies LE edema, fever, chills, N/V/D, chest pain, or palpitations.   On exam patient is resting comfortably appears to be in no acute discomfort.  She is mildly tachycardic, she has decreased lung sounds without overt wheezes rales or rhonchi.  She does not have any peripheral edema.  -Labs ordered, independently viewed and interpreted by me.  Labs remarkable for elevated BNP of 205 similar to baseline.  D-dimer of 0.61, age-adjusted it is within normal range low suspicion for PE.  Respiratory panel positive for influenza A likely the causative factor of her shortness of breath.  Since her symptoms just started last night she is in the window to be treated with Tamiflu. -The patient was maintained on a cardiac monitor.  I personally viewed and interpreted the cardiac monitored which showed an underlying rhythm of: Sinus tachycardia -Imaging independently viewed and interpreted by me and I agree with radiologist's interpretation.  Result remarkable for chest x-ray without acute changes -This patient presents to the ED for concern of shortness of breath, this involves an extensive number of treatment options, and is a complaint that carries with it a high risk of complications and morbidity.  The differential diagnosis includes COVID, flu, RSV, pneumonia, anemia, COPD, CHF, ACS -Co morbidities that complicate the patient evaluation includes diabetes, hypertension, prior stroke, breast cancer, CHF -Treatment includes DuoNebs, Tamiflu, supplemental oxygen -Reevaluation of the patient after these medicines showed that the patient improved -PCP office notes or outside notes  reviewed -Discussion with attending Dr. Adela Lank. I appreciate consultation from [facility] Dr. Alinda Money who agrees to admit pt -Escalation to admission/observation considered: pt agreeable with hospital admission  11:39 AM Patient test positive for influenza likely contributing to her shortness of breath and her cough.  Her O2 sats did drop down to 85% while sitting in the bed, on supplemental oxygen was given, additional DuoNeb provided.  Blood pressure was elevated at 202/88.  Patient has not had to blood pressure medication this morning, I have given her her blood pressure medication.          Final Clinical Impression(s) / ED Diagnoses Final diagnoses:  Acute respiratory failure with hypoxia (HCC)  Influenza A  Hypertensive urgency    Rx / DC Orders ED Discharge Orders     Anita Mccormick         Fayrene Helper, PA-C 10/13/23 1501    Melene Plan, DO 10/13/23 1505

## 2023-10-13 NOTE — ED Notes (Signed)
 Patient placed on 2L [place] by PA student

## 2023-10-13 NOTE — ED Notes (Signed)
 Patient returned to bed after ambulation, patient 90% on room air.

## 2023-10-13 NOTE — ED Notes (Signed)
 Unsuccessful IV attempt x2 RN's. MD and PA notified.

## 2023-10-13 NOTE — H&P (Signed)
 NAME:  Anita Mccormick, MRN:  [id number], DOB:  Aug 01, 1945, LOS: 0 ADMISSION DATE:  10/13/2023, CONSULTATION DATE:  10/13/23 REFERRING MD:  Dr. Alinda Money, CHIEF COMPLAINT:  SOB   History of Present Illness:  Pt encephalopathic, therefore HPI obtained from EMR.   72 yoF w/ PMH of tobacco abuse, HTN, HLD, CVA, DM, CKD2, HFpEF, anemia, breast cancer, NAFLD/ cirrhosis, and COVID 08/2023 presenting with worsening SOB, orthopnea and wheezing.  Residual cough and SOB since COVID.    On workup, found to be flu A positive, with initial 2L [place] requirement, and elevated Bps.  CXR neg.  Admitted to [facility] but had syncopal event going to the bathroom and reported she occasionally has them.  Found to be more hypoxic in the 80's. Later while awaiting bed placement, found to be in PEA bradycardic > asystolic arrest with ROSC after 3 rounds of ACLS measures and bicarb.  Post ROSC, jaw clenched, opening eyes but otherwise non responsive therefore intubated for airway protection.  IO placed due to IV infiltration during RSI.  PCCM called for further medical management.    Pertinent  Medical History  Tobacco abuse, HTN, HLD, CVA, DM, CKD2, HFpEF, anemia, breast cancer, NASH cirrhosis COVID 08/2023  Significant Hospital Events: Including procedures, antibiotic start and stop dates in addition to other pertinent events   3/18 admit to [facility] Flu PNA. Syncope. Coded. Tube after. Admit ICU   Interim History / Subjective:   Objective   Blood pressure (!) 138/92, pulse (!) 142, temperature 98.7 F (37.1 C), temperature source Oral, resp. rate (!) 40, height 5\' 2"  (1.575 m), weight 67 kg, SpO2 100%.       No intake or output data in the 24 hours ending 10/13/23 1823 Filed Weights   10/13/23 0815  Weight: 67 kg   Examination: General: critically and chronically ill elderly F intubated  HENT: ETT secure  Lungs: Poor air movement. Bilat wheezes. Mechanically ventilated  Cardiovascular: tachycardic  Abdomen: soft ndnt   Extremities: Rle IO Neuro: unresponsive GU: defer  Resolved Hospital Problem list    Assessment & Plan:   Cardiac arrest- PEA / asystolic - ROSC after .  Likely respiratory mediated but given hx of frequent syncopal events at home, r/o underlying cardiac etiology.  Neg Ddimer in ER, unlikely to be PE  P:  - hemodynamic support, MAP goal > 65 - avoid fever -CT H  - echo - EEG, non emergent  -post arrest labs  -EKG   Acute hypoxic and hypercarbic respiratory failure Flu A Tobacco abuse -vent mechanics / exam looks like AECOPD, no known hx of however  P -adv ETT 1cm -ABG - full MV support, 4-8cc/kg IBW with goal Pplat <30 and DP<15  - VAP prevention protocol/ PPI - PAD protocol for sedation> propofol/ fent gtt for RASS goal 0/-1, bowel regimen - post intubation CXR/ ABG - wean FiO2 as able for SpO2 >92% - daily SAT & SBT when appropriate  - tamiflu -with known Flu a infection, am deferring usual abx for AECOPD  - starting triple therapy + cont PRN duonebs -add'l 125 mg solumedrol + BID solumedrol moving forward  -empiric 2g mag   Syncope -has had syncopal events leading up to admission, ongoing for a while. Unclear if this is r/t her arrest.  P -ECHO  -PE unlikely w ddimer 0.6 -EKG now   HTN HLD HFpEF P -hold antihypertensives while we settle out post arrest  -ECHO ordered   DMT2 - CBG q4 w/  prn SSI  Lactic acidosis P -trend  -giving add'l amp bicarb while we await post arrest labs  -bolus LR    Best Practice (right click and "Reselect all SmartList Selections" daily)   Diet/type: NPO DVT prophylaxis LMWH Pressure ulcer(s): pressure ulcer assessment deferred  GI prophylaxis: PPI Lines: RLE IO Foley:  Yes, and it is still needed Code Status:  full code Last date of multidisciplinary goals of care discussion [--] Husband updated post arrest by hospitalist, remains full code/full scope of offered care  Labs   CBC: Recent Labs  Lab  10/13/23 0944  WBC 6.9  NEUTROABS 5.8  HGB 12.2  HCT 37.6  MCV 106.8*  PLT 138*    Basic Metabolic Panel: Recent Labs  Lab 10/13/23 0944  NA 137  K 4.0  CL 105  CO2 24  GLUCOSE 219*  BUN 9  CREATININE 0.87  CALCIUM 8.8*   GFR: Estimated Creatinine Clearance: 49.4 mL/min (by C-G formula based on SCr of 0.87 mg/dL). Recent Labs  Lab 10/13/23 0944  WBC 6.9    Liver Function Tests: No results for input(s): "AST", "ALT", "ALKPHOS", "BILITOT", "PROT", "ALBUMIN" in the last 168 hours. No results for input(s): "LIPASE", "AMYLASE" in the last 168 hours. No results for input(s): "AMMONIA" in the last 168 hours.  ABG    Component Value Date/Time   TCO2 26 11/29/2021 1002     Coagulation Profile: No results for input(s): "INR", "PROTIME" in the last 168 hours.  Cardiac Enzymes: No results for input(s): "CKTOTAL", "CKMB", "CKMBINDEX", "TROPONINI" in the last 168 hours.  HbA1C: Hemoglobin A1C  Date/Time Value Ref Range Status  07/07/2022 11:51 AM 5.7 (A) 4.0 - 5.6 % Final  03/19/2022 10:46 AM 5.9 (A) 4.0 - 5.6 % Final   Hgb A1c MFr Bld  Date/Time Value Ref Range Status  04/23/2023 12:33 PM 6.4 4.6 - 6.5 % Final    Comment:    Glycemic Control Guidelines for People with Diabetes:Non Diabetic:  <6%Goal of Therapy: <7%Additional Action Suggested:  >8%   10/14/2021 03:21 PM 6.5 4.6 - 6.5 % Final    Comment:    Glycemic Control Guidelines for People with Diabetes:Non Diabetic:  <6%Goal of Therapy: <7%Additional Action Suggested:  >8%     CBG: Recent Labs  Lab 10/13/23 1358 10/13/23 1635  GLUCAP 311* 259*    Review of Systems:   Unable to obtain, intubatred   Past Medical History:  She,  has a past medical history of Arthritis, Cancer (HCC), Cerebrovascular accident (HCC), COLONIC POLYPS, HX OF (12/10/2006), Diabetes mellitus type II, controlled (HCC) (12/10/2006), Eczema, Fatty liver disease, nonalcoholic (07/14/2014), GERD (gastroesophageal reflux disease),  History of blood transfusion, History of kidney stones (2023), Hypertension, Iron deficiency anemia, unspecified (05/08/2023), Sepsis (HCC) (09/19/2017), Sepsis due to Escherichia coli (E. coli) (HCC), Symptomatic anemia (06/06/2021), UTI (urinary tract infection) (09/19/2017), and Vaginal discharge (04/01/2022).   Surgical History:   Past Surgical History:  Procedure Laterality Date   ARTERY BIOPSY Left 11/29/2021   Procedure: LEFT BIOPSY TEMPORAL ARTERY;  Surgeon: Chuck Hint, MD;  [facility];  Service: Vascular;  Laterality: Left;   CATARACT EXTRACTION Bilateral    MASTECTOMY Left    TUBAL LIGATION       Social History:   reports that she has been smoking cigarettes. She has a 2 pack-year smoking history. She has never used smokeless tobacco. She reports that she does not currently use alcohol. She reports that she does not use drugs.   Family  History:  Her family history includes Cancer in her father; Diabetes in her sister; Pancreatic cancer in her sister; Stroke in her mother.   Allergies Allergies  Allergen Reactions   Bee Venom Swelling   Penicillins Hives   Ace Inhibitors Cough    ????   Chlorthalidone     REACTION: unspecified   Metformin     REACTION: gi side effects   Sulfamethoxazole     REACTION: questionable     Home Medications  Prior to Admission medications   Medication Sig Start Date End Date Taking? Authorizing Provider  aspirin EC 81 MG tablet Take 81 mg by mouth daily. Swallow whole.   Yes [provider]  atenolol (TENORMIN) 100 MG tablet TAKE 1 TABLET BY MOUTH TWICE A DAY 04/27/23  Yes Deeann Saint, MD  Continuous Glucose Sensor (DEXCOM G7 SENSOR) MISC Apply a new sensor to skin every 10 days. 04/23/23  Yes Deeann Saint, MD  Dulaglutide (TRULICITY) 1.5 MG/0.5ML SOAJ Inject 1.5 mg into the skin once a week.   Yes [provider]  ferrous gluconate (FERGON) 324 MG tablet Take 1 tablet (324 mg total) by mouth daily.  04/23/23  Yes Deeann Saint, MD  glipiZIDE (GLUCOTROL XL) 10 MG 24 hr tablet TAKE 1 TABLET BY MOUTH TWICE A DAY 09/28/23  Yes Deeann Saint, MD  Multiple Vitamin (MULTIVITAMIN) tablet Take 1 tablet by mouth daily.   Yes [provider]  naproxen sodium (ALEVE) 220 MG tablet Take 220 mg by mouth daily as needed.   Yes [provider]  benzonatate (TESSALON) 100 MG capsule Take 1 capsule (100 mg total) by mouth 2 (two) times daily as needed for cough. Patient not taking: Reported on 10/13/2023 08/26/23   Deeann Saint, MD  blood glucose meter kit and supplies KIT Dispense based on patient and insurance preference. Use up to four times daily as directed. 06/05/21   Deeann Saint, MD  Lancets [facility] ULTRASOFT) lancets Use as instructed 07/07/22   Deeann Saint, MD     Critical care time: 41 min      CRITICAL CARE Performed by: Lanier Clam   Total critical care time: 41 minutes  Critical care time was exclusive of separately billable procedures and treating other patients. Critical care was necessary to treat or prevent imminent or life-threatening deterioration.  Critical care was time spent personally by me on the following activities: development of treatment plan with patient and/or surrogate as well as nursing, discussions with consultants, evaluation of patient's response to treatment, examination of patient, obtaining history from patient or surrogate, ordering and performing treatments and interventions, ordering and review of laboratory studies, ordering and review of radiographic studies, pulse oximetry and re-evaluation of patient's condition.   Tessie Fass MSN, AGACNP-BC [facility] Medicine Amion for pager  10/13/2023, 6:58 PM

## 2023-10-13 NOTE — Code Documentation (Signed)
 Preparing to intubate.

## 2023-10-13 NOTE — ED Triage Notes (Signed)
 Patient arrives via [facility] from home with shortness of breath, cough, and headache x a couple weeks. Completed abx treatment .Alert and oriented x4. BP 172/94, HR104. Initial room sat 80%, 1 duoneb now O2 98. Wheezing in all lung fields-hot to touch, CBG 308, hx of diabetes.

## 2023-10-13 NOTE — Procedures (Signed)
 Arterial Catheter Insertion Procedure Note  Anita Mccormick  [id number]  12-14-1945  Date:10/13/23  Time:9:06 PM    Provider Performing: Letitia Libra. PCCM PA supervised   Procedure: Insertion of Arterial Line (36644) with US guidance (03474)   Indication(s) Blood pressure monitoring and/or need for frequent ABGs  Consent Unable to obtain consent due to emergent nature of procedure.  Anesthesia None   Time Out Verified patient identification, verified procedure, site/side was marked, verified correct patient position, special equipment/implants available, medications/allergies/relevant history reviewed, required imaging and test results available.   Sterile Technique Maximal sterile technique including full sterile barrier drape, hand hygiene, sterile gown, sterile gloves, mask, hair covering, sterile ultrasound probe cover (if used).   Procedure Description Area of catheter insertion was cleaned with chlorhexidine and draped in sterile fashion. With real-time ultrasound guidance an arterial catheter was placed into the right radial artery.  Appropriate arterial tracings confirmed on monitor.     Complications/Tolerance None; patient tolerated the procedure well.   EBL Minimal   Specimen(s) None   [name] AGACNP-BC   [facility] 10/13/2023, 9:08 PM  Please see [email or web address] for pager details.  From 7A-7P if no response, please call [phone]. After hours, please call ELink [phone].

## 2023-10-13 NOTE — ED Provider Notes (Signed)
 Code Blue Note I was called to the patient's room for a CODE BLUE.  The patient was apparently bradycardic on the monitors and when her nurses went to check on her she did not have a pulse.  ACLS protocol was followed.  The patient did have return of spontaneous circulation after 3 rounds of epinephrine and bicarbonate.  Her rhythm in between rhythm checks was asystole.  After return of spontaneous circulation the patient's jaw was clenched.  She would open her eyes spontaneously but would not follow any commands.  She is subsequently intubated for airway protection.  I did place an IO in the patient's right tibia when she started coding because we do not have IV access.  A right AC line was placed under ultrasound guidance by nursing staff.  Initially the patient was given rocuronium of etomidate and this did not seem to be working.  She was given additional dose of rocuronium.  At that point it was noted that her line was infiltrated.  The patient was subsequently intubated using her IO for medications.  A call was placed to the ICU for further management.  INTUBATION Performed by: Durwin Glaze  Required items: required blood products, implants, devices, and special equipment available Patient identity confirmed: provided demographic data and hospital-assigned identification number Time out: Immediately prior to procedure a "time out" was called to verify the correct patient, procedure, equipment, support staff and site/side marked as required.  Indications: Hypoxia, airway protection  Intubation method: Glidescope Laryngoscopy   Preoxygenation: BVM  Sedatives: Etomidate Paralytic: Succinylcholine  Tube Size: 8-0 cuffed  Post-procedure assessment: chest rise and ETCO2 monitor Breath sounds: equal and absent over the epigastrium Tube secured with: ETT holder Chest x-ray interpreted by radiologist and me.  Chest x-ray findings: endotracheal tube in appropriate position  Patient tolerated  the procedure well with no immediate complications.  CRITICAL CARE Performed by: Durwin Glaze   Total critical care time: 35 minutes  Critical care time was exclusive of separately billable procedures and treating other patients.  Critical care was necessary to treat or prevent imminent or life-threatening deterioration.  Critical care was time spent personally by me on the following activities: development of treatment plan with patient and/or surrogate as well as nursing, discussions with consultants, evaluation of patient's response to treatment, examination of patient, obtaining history from patient or surrogate, ordering and performing treatments and interventions, ordering and review of laboratory studies, ordering and review of radiographic studies, pulse oximetry and re-evaluation of patient's condition.      Durwin Glaze, MD 10/13/23 [phone]

## 2023-10-13 NOTE — Code Documentation (Signed)
 Epi right tib

## 2023-10-13 NOTE — Significant Event (Signed)
 Significant event  CODE BLUE I was notified by RN via page that patient had had undergone a CODE BLUE and resuscitation. I was in another part of the hospital at the time did not respond to the code.   Patient had been herself following her syncopal event earlier in the ED.  A nurse had just set her up for her meal tray.  Shortly thereafter patient became bradycardic and they went to check on her and she was pulseless and unresponsive.  CODE BLUE was called and ED provider responded and ran the code.  Patient reportedly was in asystole and had return of spontaneous circulation within about 3 rounds of epinephrine and bicarbonate.  Patient was intubated and EDP spoke with ICU team for transfer to the ED.  I have also communicated with the ICU team.  I attempted to reach patient's husband and daughter by phone but there was no answer.  Ginette Otto, DO

## 2023-10-13 NOTE — Progress Notes (Signed)
 Chaplain responded to ED charge nurse to offer support to ED Rm 11. Upon arrival Chaplain learned pt moved. Checking in with pt's nurse Chaplain learned pt's husband was in family waiting area.  Chaplain checked in with husband to learn he had asked his son not to come to the hospital because he drinks too much. Son arrived inebriated.   Suspect that is why Chaplain was called to ED.  Husband denied needing Chaplain at this time.  Son had left hospital.  Husband returned to waiting in family area until he can visit his wife's bedside. Chaplain reported this to pt's nurse.  Chaplain on standby throughout the night if needed.    Vernell Morgans Chaplain

## 2023-10-13 NOTE — Progress Notes (Signed)
 ETT advanced 1cm per MD order, ETT secured at 26 at the lips

## 2023-10-13 NOTE — ED Notes (Signed)
Port XR at bedside.

## 2023-10-13 NOTE — Progress Notes (Signed)
 Pt transported from ED11 to 0207 via ventilator without complications

## 2023-10-13 NOTE — ED Notes (Signed)
 Patient transported to X-ray

## 2023-10-13 NOTE — ED Notes (Addendum)
 Pt appears to have had a witnessed syncopal episode by this RN. She got up previously with assistance to the bedside commode. After getting back in the bed, she was complaining of being hot and feeling like she was going to pass out. Increased her [place] to 4L due to sats in the high 80s then given a breathing treatment. While I was hooking up her treatment, she lost conciousness. Pt breathing has become more tachypneic. Alert to painful stimuli but not responding verbally at this time. Alinda Money MD made aware. Will come to department to assess pt.

## 2023-10-13 NOTE — Progress Notes (Signed)
 2D echo attempted, patient care needed. Will try echo later.

## 2023-10-13 NOTE — Code Documentation (Signed)
Asystole  

## 2023-10-13 NOTE — ED Notes (Signed)
 PA at bedside.

## 2023-10-13 NOTE — Progress Notes (Signed)
 eLink Physician-Brief Progress Note Patient Name: Anita Mccormick DOB: 1946-04-07 MRN: [id number]   Date of Service  10/13/2023  HPI/Events of Note  56 yoF w/ PMH of tobacco abuse, HTN, HLD, CVA, DM, CKD2, HFpEF, anemia, breast cancer, NAFLD/ cirrhosis, and COVID 08/2023 presenting with worsening SOB, orthopnea and wheezing status post 10-minute cardiac arrest with ROSC in the setting of syncope and influenza A infection.  Patient is tachypneic, tachycardic, and hypertensive.  Currently on propofol, fentanyl, norepinephrine infusions.  Mechanically ventilated.  Results consistent with improved respiratory acidosis elevated creatinine, anion gap, and mild transaminitis.  Troponin elevation and lactic acid present.  Radiograph reviewed with appropriate endotracheal and enteric tube positioning  eICU Interventions  Maintain oseltamavir  Continues propofol and as needed fentanyl as needed-maintain mechanical ventilation with respiratory support.  DVT prophylaxis Lovenox GI prophylaxis with pantoprazole   0114 -patient developed tension pneumothorax which has since resolved with placement of left-sided tube thoracostomy.  Initially hypotensive requiring norepinephrine, now hypertensive after weaning off of nor epi.  Off vasopressin.  Will add as needed antihypertensives.  Troponin elevated, trend for now.  0246 -developed nonsustained VT.,  Baseline normal sinus rhythm now.  Will add potassium and calcium supplementation.  0601 -troponin now greater than 24,000.  Last EKG with no signs of acute MI.  Echo pending.  Last EF 65% with mild LVH but otherwise preserved cardiac function.  Will discuss with cardiology-consult placed and paged.  Neurological status still unclear  Intervention Category Evaluation Type: New Patient Evaluation  Kena Limon 10/13/2023, 7:50 PM

## 2023-10-14 ENCOUNTER — Inpatient Hospital Stay (HOSPITAL_COMMUNITY)

## 2023-10-14 DIAGNOSIS — I214 Non-ST elevation (NSTEMI) myocardial infarction: Secondary | ICD-10-CM

## 2023-10-14 DIAGNOSIS — J09X2 Influenza due to identified novel influenza A virus with other respiratory manifestations: Secondary | ICD-10-CM | POA: Diagnosis not present

## 2023-10-14 DIAGNOSIS — I469 Cardiac arrest, cause unspecified: Secondary | ICD-10-CM | POA: Diagnosis not present

## 2023-10-14 DIAGNOSIS — R55 Syncope and collapse: Secondary | ICD-10-CM

## 2023-10-14 DIAGNOSIS — J9601 Acute respiratory failure with hypoxia: Secondary | ICD-10-CM | POA: Diagnosis not present

## 2023-10-14 DIAGNOSIS — R7989 Other specified abnormal findings of blood chemistry: Secondary | ICD-10-CM | POA: Diagnosis not present

## 2023-10-14 DIAGNOSIS — R579 Shock, unspecified: Secondary | ICD-10-CM

## 2023-10-14 LAB — ECHOCARDIOGRAM COMPLETE
Height: 62 in
S' Lateral: 3.4 cm
Weight: 2363.33 [oz_av]

## 2023-10-14 LAB — POCT I-STAT 7, (LYTES, BLD GAS, ICA,H+H)
Acid-Base Excess: 1 mmol/L (ref 0.0–2.0)
Acid-base deficit: 1 mmol/L (ref 0.0–2.0)
Bicarbonate: 24.8 mmol/L (ref 20.0–28.0)
Bicarbonate: 27 mmol/L (ref 20.0–28.0)
Calcium, Ion: 1.13 mmol/L — ABNORMAL LOW (ref 1.15–1.40)
Calcium, Ion: 1.27 mmol/L (ref 1.15–1.40)
HCT: 32 % — ABNORMAL LOW (ref 36.0–46.0)
HCT: 31 % — ABNORMAL LOW (ref 36.0–46.0)
Hemoglobin: 10.9 g/dL — ABNORMAL LOW (ref 12.0–15.0)
Hemoglobin: 10.5 g/dL — ABNORMAL LOW (ref 12.0–15.0)
O2 Saturation: 98 %
O2 Saturation: 98 %
Patient temperature: 95.5
Patient temperature: 97.6
Potassium: 3.4 mmol/L — ABNORMAL LOW (ref 3.5–5.1)
Potassium: 4.8 mmol/L (ref 3.5–5.1)
Sodium: 140 mmol/L (ref 135–145)
Sodium: 140 mmol/L (ref 135–145)
TCO2: 26 mmol/L (ref 22–32)
TCO2: 28 mmol/L (ref 22–32)
pCO2 arterial: 42.8 mmHg (ref 32–48)
pCO2 arterial: 45.8 mmHg (ref 32–48)
pH, Arterial: 7.363 (ref 7.35–7.45)
pH, Arterial: 7.376 (ref 7.35–7.45)
pO2, Arterial: 98 mmHg (ref 83–108)
pO2, Arterial: 109 mmHg — ABNORMAL HIGH (ref 83–108)

## 2023-10-14 LAB — COMPREHENSIVE METABOLIC PANEL
ALT: 44 U/L (ref 0–44)
AST: 146 U/L — ABNORMAL HIGH (ref 15–41)
Albumin: 2.6 g/dL — ABNORMAL LOW (ref 3.5–5.0)
Alkaline Phosphatase: 49 U/L (ref 38–126)
Anion gap: 7 (ref 5–15)
BUN: 13 mg/dL (ref 8–23)
CO2: 26 mmol/L (ref 22–32)
Calcium: 8.7 mg/dL — ABNORMAL LOW (ref 8.9–10.3)
Chloride: 105 mmol/L (ref 98–111)
Creatinine, Ser: 1.19 mg/dL — ABNORMAL HIGH (ref 0.44–1.00)
GFR, Estimated: 47 mL/min — ABNORMAL LOW (ref >60)
Glucose, Bld: 241 mg/dL — ABNORMAL HIGH (ref 70–99)
Potassium: 4.3 mmol/L (ref 3.5–5.1)
Sodium: 138 mmol/L (ref 135–145)
Total Bilirubin: 0.7 mg/dL (ref 0.0–1.2)
Total Protein: 5.4 g/dL — ABNORMAL LOW (ref 6.5–8.1)

## 2023-10-14 LAB — URINALYSIS, ROUTINE W REFLEX MICROSCOPIC
Bilirubin Urine: NEGATIVE
Glucose, UA: >=500 mg/dL — AB
Ketones, ur: 5 mg/dL — AB
Leukocytes,Ua: NEGATIVE
Nitrite: NEGATIVE
Protein, ur: NEGATIVE mg/dL
Specific Gravity, Urine: 1.008 (ref 1.005–1.030)
pH: 6 (ref 5.0–8.0)

## 2023-10-14 LAB — GLUCOSE, CAPILLARY
Glucose-Capillary: 253 mg/dL — ABNORMAL HIGH (ref 70–99)
Glucose-Capillary: 188 mg/dL — ABNORMAL HIGH (ref 70–99)
Glucose-Capillary: 159 mg/dL — ABNORMAL HIGH (ref 70–99)
Glucose-Capillary: 193 mg/dL — ABNORMAL HIGH (ref 70–99)
Glucose-Capillary: 167 mg/dL — ABNORMAL HIGH (ref 70–99)
Glucose-Capillary: 149 mg/dL — ABNORMAL HIGH (ref 70–99)

## 2023-10-14 LAB — COOXEMETRY PANEL
Carboxyhemoglobin: 1.7 % — ABNORMAL HIGH (ref 0.5–1.5)
Methemoglobin: <0.7 % (ref 0.0–1.5)
O2 Saturation: 74.1 %
Total hemoglobin: 10.7 g/dL — ABNORMAL LOW (ref 12.0–16.0)

## 2023-10-14 LAB — CBC
HCT: 30.3 % — ABNORMAL LOW (ref 36.0–46.0)
Hemoglobin: 10.2 g/dL — ABNORMAL LOW (ref 12.0–15.0)
MCH: 35.5 pg — ABNORMAL HIGH (ref 26.0–34.0)
MCHC: 33.7 g/dL (ref 30.0–36.0)
MCV: 105.6 fL — ABNORMAL HIGH (ref 80.0–100.0)
Platelets: 92 10*3/uL — ABNORMAL LOW (ref 150–400)
RBC: 2.87 MIL/uL — ABNORMAL LOW (ref 3.87–5.11)
RDW: 13.4 % (ref 11.5–15.5)
WBC: 7.8 10*3/uL (ref 4.0–10.5)
nRBC: 0 % (ref 0.0–0.2)

## 2023-10-14 LAB — MAGNESIUM: Magnesium: 1.5 mg/dL — ABNORMAL LOW (ref 1.7–2.4)

## 2023-10-14 LAB — TRIGLYCERIDES: Triglycerides: 50 mg/dL (ref <150)

## 2023-10-14 LAB — TROPONIN I (HIGH SENSITIVITY)
Troponin I (High Sensitivity): >24000 ng/L (ref <18)
Troponin I (High Sensitivity): >24000 ng/L (ref <18)

## 2023-10-14 LAB — PHOSPHORUS: Phosphorus: 3 mg/dL (ref 2.5–4.6)

## 2023-10-14 LAB — LACTIC ACID, PLASMA
Lactic Acid, Venous: 3.2 mmol/L (ref 0.5–1.9)
Lactic Acid, Venous: 2.2 mmol/L (ref 0.5–1.9)

## 2023-10-14 LAB — HEPARIN LEVEL (UNFRACTIONATED): Heparin Unfractionated: 0.79 [IU]/mL — ABNORMAL HIGH (ref 0.30–0.70)

## 2023-10-14 MED ORDER — MAGNESIUM SULFATE 50 % IJ SOLN
6.0000 g | Freq: Once | INTRAVENOUS | Status: AC
  Administered 2023-10-14: 6 g via INTRAVENOUS
  Filled 2023-10-14: qty 12

## 2023-10-14 MED ORDER — OSELTAMIVIR PHOSPHATE 30 MG PO CAPS
30.0000 mg | ORAL_CAPSULE | Freq: Every day | ORAL | Status: DC
  Administered 2023-10-14: 30 mg
  Filled 2023-10-14 (×2): qty 1

## 2023-10-14 MED ORDER — HEPARIN (PORCINE) 25000 UT/250ML-% IV SOLN
600.0000 [IU]/h | INTRAVENOUS | Status: DC
  Administered 2023-10-14: 750 [IU]/h via INTRAVENOUS
  Filled 2023-10-14: qty 250

## 2023-10-14 MED ORDER — HYDRALAZINE HCL 20 MG/ML IJ SOLN
10.0000 mg | INTRAMUSCULAR | Status: DC | PRN
  Administered 2023-10-16 – 2023-10-17 (×3): 10 mg via INTRAVENOUS
  Filled 2023-10-14 (×3): qty 1

## 2023-10-14 MED ORDER — SODIUM CHLORIDE 0.9 % IV SOLN
500.0000 mg | INTRAVENOUS | Status: AC
  Administered 2023-10-14 – 2023-10-16 (×3): 500 mg via INTRAVENOUS
  Filled 2023-10-14 (×3): qty 5

## 2023-10-14 MED ORDER — CALCIUM GLUCONATE-NACL 2-0.675 GM/100ML-% IV SOLN
2.0000 g | Freq: Once | INTRAVENOUS | Status: AC
  Administered 2023-10-14: 2000 mg via INTRAVENOUS
  Filled 2023-10-14: qty 100

## 2023-10-14 MED ORDER — POTASSIUM CHLORIDE 20 MEQ PO PACK
60.0000 meq | PACK | Freq: Once | ORAL | Status: AC
  Administered 2023-10-14: 60 meq
  Filled 2023-10-14: qty 3

## 2023-10-14 MED ORDER — FENTANYL 2500MCG IN NS 250ML (10MCG/ML) PREMIX INFUSION
25.0000 ug/h | INTRAVENOUS | Status: DC
  Administered 2023-10-14 – 2023-10-15 (×2): 50 ug/h via INTRAVENOUS
  Filled 2023-10-14: qty 250

## 2023-10-14 NOTE — Progress Notes (Signed)
 NAME:  Anita Mccormick, MRN:  [id number], DOB:  Aug 25, 1945, LOS: 1 ADMISSION DATE:  10/13/2023, CONSULTATION DATE:  10/13/23 REFERRING MD:  Dr. Alinda Money, CHIEF COMPLAINT:  SOB   History of Present Illness:  Pt encephalopathic, therefore HPI obtained from EMR.   82 yoF w/ PMH of tobacco abuse, HTN, HLD, CVA, DM, CKD2, HFpEF, anemia, breast cancer, NAFLD/ cirrhosis, and COVID 08/2023 presenting with worsening SOB, orthopnea and wheezing.  Residual cough and SOB since COVID.    On workup, found to be flu A positive, with initial 2L [place] requirement, and elevated Bps.  CXR neg.  Admitted to [facility] but had syncopal event going to the bathroom and reported she occasionally has them.  Found to be more hypoxic in the 80's. Later while awaiting bed placement, found to be in PEA bradycardic > asystolic arrest with ROSC after 3 rounds of ACLS measures and bicarb.  Post ROSC, jaw clenched, opening eyes but otherwise non responsive therefore intubated for airway protection.  IO placed due to IV infiltration during RSI.  PCCM called for further medical management.    Pertinent  Medical History  Tobacco abuse, HTN, HLD, CVA, DM, CKD2, HFpEF, anemia, breast cancer, NASH cirrhosis COVID 08/2023  Significant Hospital Events: Including procedures, antibiotic start and stop dates in addition to other pertinent events   3/18 admit to [facility] Flu PNA. Syncope. Coded. Tube after. Admit ICU   Interim History / Subjective:  Overnight, episodes of  hypotensive with placement of lines.   S/p intubation with left pneumothorax, chest tube placed, repeat CXR showed resolution.  This morning, she was somnolent, able to follow some commands. Objective   Blood pressure (!) 114/56, pulse 84, temperature 97.9 F (36.6 C), temperature source Bladder, resp. rate (!) 28, height 5\' 2"  (1.575 m), weight 67 kg, SpO2 100%.  CVP:  [5 mmHg-20 mmHg] 12 mmHg  Vent Mode: PRVC FiO2 (%):  [40 %-100 %] 40 % Set Rate:  [18 bmp-28 bmp] 28 bmp Vt Set:   [400 mL] 400 mL PEEP:  [5 cmH20] 5 cmH20 Plateau Pressure:  [19 cmH20-26 cmH20] 20 cmH20     Intake/Output Summary (Last 24 hours) at 10/14/2023 0820 Last data filed at 10/14/2023 0800 Gross per 24 hour  Intake 3533.52 ml  Output 834 ml  Net 2699.52 ml   Filed Weights   10/13/23 0815  Weight: 67 kg   Labs. WBC 7.8, Hb 10.2 Fasting glucose 246 Magnesium 1.5 Troponin 484>>3871 >>24,000 Lactic acid 6.1 >>3.2 AST 156 [was 58 1 days ago]. EKG normal sinus rhythm with first-degree AV block.  CXR  Mild, hazy right upper lobe airspace disease.   Examination: General: critically and chronically ill elderly F intubated  HENT: ETT secure  Lungs: Poor air movement. Bilat wheezes. Mechanically ventilated  Cardiovascular: tachycardic  Abdomen: soft ndnt  Extremities: warm Neuro: alert, able to follow commands.  Resolved Hospital Problem list    Assessment & Plan:   NSTEMI S/p cardiac arrest yesterday, troponin uptrending 484>> 3871>>24,0000 . Troponin elevation 2/2 to trauma from CPR. She denies chest pain.  EKG normal sinus rhythm, without ST elevation.  This morning, patient is hypotensive, mildly responsive to passive leg raise on exam, I do not think patient is in hypovolemic vs septic shock.  I query if patient is in cardiogenic shock.  Last echo 2022 LVEF 65% with left ventricular hypertrophy. Cardiology consulted.  -Heparin drips -Started norepinephrine and vasopressin -Co- oximetry. -Follow-up echocardiogram  # Acute hypoxic and hypercarbic respiratory failure #  Flu A. ? COPD Exacerbation  Respiratory failure secondary to flu, and pneumonia.  She has mild wheezes on exam.     - triple therapy + cont PRN duonebs - IV ceftriaxone, will add azithromycin for broad coverage - Will treat flu for 10 days - Solu-Medrol twice daily  Left pneumothorax Resolved, has a chest tube in place.   - Monitor.  HTN HLD HFpEF P -hold antihypertensives while we settle out post  arrest  -ECHO ordered   DMT2 Elevated fasting glucose secondary to steroids. - CBG q4 w/ prn SSI   Best Practice (right click and "Reselect all SmartList Selections" daily)   Diet/type: NPO DVT prophylaxis LMWH Pressure ulcer(s): pressure ulcer assessment deferred  GI prophylaxis: PPI Lines: RLE IO Foley:  Yes, and it is still needed Code Status:  full code Last date of multidisciplinary goals of care discussion [--] Husband updated post arrest by hospitalist, remains full code/full scope of offered care  Labs   CBC: Recent Labs  Lab 10/13/23 0944 10/13/23 1815 10/13/23 1911 10/13/23 2023 10/14/23 0106 10/14/23 0442  WBC 6.9  --   --   --   --  7.8  NEUTROABS 5.8  --   --   --   --   --   HGB 12.2 13.3 12.9 11.2* 10.9* 10.2*  HCT 37.6 39.0 38.0 33.0* 32.0* 30.3*  MCV 106.8*  --   --   --   --  105.6*  PLT 138*  --   --   --   --  92*    Basic Metabolic Panel: Recent Labs  Lab 10/13/23 0944 10/13/23 1815 10/13/23 1845 10/13/23 1911 10/13/23 2023 10/14/23 0106 10/14/23 0442  NA 137   < > 142 139 140 140 138  K 4.0   < > 3.9 4.3 4.1 3.4* 4.3  CL 105  --  101  --   --   --  105  CO2 24  --  21*  --   --   --  26  GLUCOSE 219*  --  306*  --   --   --  241*  BUN 9  --  11  --   --   --  13  CREATININE 0.87  --  1.34*  --   --   --  1.19*  CALCIUM 8.8*  --  8.7*  --   --   --  8.7*  MG  --   --  2.1  --   --   --  1.5*  PHOS  --   --   --   --   --   --  3.0   < > = values in this interval not displayed.   GFR: Estimated Creatinine Clearance: 36.1 mL/min (A) (by C-G formula based on SCr of 1.19 mg/dL (H)). Recent Labs  Lab 10/13/23 0944 10/13/23 1816 10/13/23 2022 10/13/23 2135 10/14/23 0442  WBC 6.9  --   --   --  7.8  LATICACIDVEN  --  11.0* 6.3* 6.1*  --     Liver Function Tests: Recent Labs  Lab 10/13/23 1845 10/14/23 0442  AST 58* 146*  ALT 27 44  ALKPHOS 69 49  BILITOT 0.9 0.7  PROT 6.6 5.4*  ALBUMIN 3.4* 2.6*   No results for  input(s): "LIPASE", "AMYLASE" in the last 168 hours. No results for input(s): "AMMONIA" in the last 168 hours.  ABG    Component Value Date/Time   PHART 7.363 10/14/2023  0106   PCO2ART 42.8 10/14/2023 0106   PO2ART 98 10/14/2023 0106   HCO3 24.8 10/14/2023 0106   TCO2 26 10/14/2023 0106   ACIDBASEDEF 1.0 10/14/2023 0106   O2SAT 98 10/14/2023 0106     Coagulation Profile: No results for input(s): "INR", "PROTIME" in the last 168 hours.  Cardiac Enzymes: No results for input(s): "CKTOTAL", "CKMB", "CKMBINDEX", "TROPONINI" in the last 168 hours.  HbA1C: Hgb A1c MFr Bld  Date/Time Value Ref Range Status  10/13/2023 06:53 PM 5.0 4.8 - 5.6 % Final    Comment:    (NOTE) Pre diabetes:          5.7%-6.4%  Diabetes:              >6.4%  Glycemic control for   <7.0% adults with diabetes   04/23/2023 12:33 PM 6.4 4.6 - 6.5 % Final    Comment:    Glycemic Control Guidelines for People with Diabetes:Non Diabetic:  <6%Goal of Therapy: <7%Additional Action Suggested:  >8%     CBG: Recent Labs  Lab 10/13/23 1635 10/13/23 2000 10/13/23 2334 10/14/23 0325 10/14/23 0744  GLUCAP 259* 239* 273* 253* 188*    Review of Systems:   Unable to obtain, intubatred   Past Medical History:  She,  has a past medical history of Arthritis, Cancer (HCC), Cerebrovascular accident (HCC), COLONIC POLYPS, HX OF (12/10/2006), Diabetes mellitus type II, controlled (HCC) (12/10/2006), Eczema, Fatty liver disease, nonalcoholic (07/14/2014), GERD (gastroesophageal reflux disease), History of blood transfusion, History of kidney stones (2023), Hypertension, Iron deficiency anemia, unspecified (05/08/2023), Sepsis (HCC) (09/19/2017), Sepsis due to Escherichia coli (E. coli) (HCC), Symptomatic anemia (06/06/2021), UTI (urinary tract infection) (09/19/2017), and Vaginal discharge (04/01/2022).   Surgical History:   Past Surgical History:  Procedure Laterality Date   ARTERY BIOPSY Left 11/29/2021    Procedure: LEFT BIOPSY TEMPORAL ARTERY;  Surgeon: Chuck Hint, MD;  [facility];  Service: Vascular;  Laterality: Left;   CATARACT EXTRACTION Bilateral    MASTECTOMY Left    TUBAL LIGATION       Social History:   reports that she has been smoking cigarettes. She has a 2 pack-year smoking history. She has never used smokeless tobacco. She reports that she does not currently use alcohol. She reports that she does not use drugs.   Family History:  Her family history includes Cancer in her father; Diabetes in her sister; Pancreatic cancer in her sister; Stroke in her mother.   Allergies Allergies  Allergen Reactions   Bee Venom Swelling   Penicillins Hives   Ace Inhibitors Cough    ????   Chlorthalidone     REACTION: unspecified   Metformin     REACTION: gi side effects   Sulfamethoxazole     REACTION: questionable     Home Medications  Prior to Admission medications   Medication Sig Start Date End Date Taking? Authorizing Provider  aspirin EC 81 MG tablet Take 81 mg by mouth daily. Swallow whole.   Yes [provider]  atenolol (TENORMIN) 100 MG tablet TAKE 1 TABLET BY MOUTH TWICE A DAY 04/27/23  Yes Deeann Saint, MD  Continuous Glucose Sensor (DEXCOM G7 SENSOR) MISC Apply a new sensor to skin every 10 days. 04/23/23  Yes Deeann Saint, MD  Dulaglutide (TRULICITY) 1.5 MG/0.5ML SOAJ Inject 1.5 mg into the skin once a week.   Yes [provider]  ferrous gluconate (FERGON) 324 MG tablet Take 1 tablet (324 mg total) by mouth daily.  04/23/23  Yes Deeann Saint, MD  glipiZIDE (GLUCOTROL XL) 10 MG 24 hr tablet TAKE 1 TABLET BY MOUTH TWICE A DAY 09/28/23  Yes Deeann Saint, MD  Multiple Vitamin (MULTIVITAMIN) tablet Take 1 tablet by mouth daily.   Yes [provider]  naproxen sodium (ALEVE) 220 MG tablet Take 220 mg by mouth daily as needed.   Yes [provider]  benzonatate (TESSALON) 100 MG capsule Take 1 capsule (100 mg  total) by mouth 2 (two) times daily as needed for cough. Patient not taking: Reported on 10/13/2023 08/26/23   Deeann Saint, MD  blood glucose meter kit and supplies KIT Dispense based on patient and insurance preference. Use up to four times daily as directed. 06/05/21   Deeann Saint, MD  Lancets [facility] ULTRASOFT) lancets Use as instructed 07/07/22   Deeann Saint, MD          Laretta Bolster, M.D.  Internal Medicine Resident, PGY-1 [facility]  Pager: [phone] 8:20 AM, 10/14/2023   **Please contact the on call pager after 5 pm and on weekends at [phone].**

## 2023-10-14 NOTE — Progress Notes (Signed)
 At 0226, while providing oral care, pt was awake and following commands and suddenly went into a wide complex non-sustained ventricular rhythm.  Self-limiting.  Strip printed electronically to chart.  See "Media" tab in Chart Review.  eLink made aware.  See MD orders.

## 2023-10-14 NOTE — Plan of Care (Signed)
  Problem: Education: Goal: Ability to describe self-care measures that may prevent or decrease complications (Diabetes Survival Skills Education) will improve Outcome: Progressing   Problem: Coping: Goal: Ability to adjust to condition or change in health will improve Outcome: Progressing   Problem: Nutritional: Goal: Maintenance of adequate nutrition will improve Outcome: Progressing   Problem: Skin Integrity: Goal: Risk for impaired skin integrity will decrease Outcome: Progressing   Problem: Tissue Perfusion: Goal: Adequacy of tissue perfusion will improve Outcome: Progressing   Problem: Respiratory: Goal: Ability to maintain a clear airway and adequate ventilation will improve Outcome: Progressing   Problem: Clinical Measurements: Goal: Diagnostic test results will improve Outcome: Progressing Goal: Respiratory complications will improve Outcome: Progressing Goal: Cardiovascular complication will be avoided Outcome: Progressing

## 2023-10-14 NOTE — Progress Notes (Signed)
 PHARMACY - ANTICOAGULATION CONSULT NOTE  Pharmacy Consult for heparin infusion Indication: chest pain/ACS  Allergies  Allergen Reactions   Bee Venom Swelling   Penicillins Hives   Ace Inhibitors Cough    ????   Chlorthalidone     REACTION: unspecified   Metformin     REACTION: gi side effects   Sulfamethoxazole     REACTION: questionable    Patient Measurements: Height: 5\' 2"  (157.5 cm) Weight: 67 kg (147 lb 11.3 oz) IBW/kg (Calculated) : 50.1 Heparin Dosing Weight: 63.9 kg  Vital Signs: Temp: 96.4 F (35.8 C) (03/19 1800) Temp Source: Bladder (03/19 1600) BP: 111/63 (03/19 1800) Pulse Rate: 64 (03/19 1800)  Labs: Recent Labs    10/13/23 0944 10/13/23 1815 10/13/23 1845 10/13/23 1911 10/13/23 2023 10/13/23 2135 10/14/23 0106 10/14/23 0442 10/14/23 1401 10/14/23 1800  HGB 12.2   < >  --    < > 11.2*  --  10.9* 10.2*  --   --   HCT 37.6   < >  --    < > 33.0*  --  32.0* 30.3*  --   --   PLT 138*  --   --   --   --   --   --  92*  --   --   HEPARINUNFRC  --   --   --   --   --   --   --   --   --  0.79*  CREATININE 0.87  --  1.34*  --   --   --   --  1.19*  --   --   TROPONINIHS  --    < >  --   --   --  3,871*  --  >24,000* >24,000*  --    < > = values in this interval not displayed.    Estimated Creatinine Clearance: 36.1 mL/min (A) (by C-G formula based on SCr of 1.19 mg/dL (H)).   Medical History: Past Medical History:  Diagnosis Date   Arthritis    Cancer (HCC)    breast left   Cerebrovascular accident [facility])    October 29 2021- Left eye- blind   COLONIC POLYPS, HX OF 12/10/2006   Qualifier: Diagnosis of  By: Cato Mulligan MD, Bruce     Diabetes mellitus type II, controlled (HCC) 12/10/2006   Poor control on Januvia 100mg  and glipizide 10mg  XL Lab Results  Component Value Date   HGBA1C 8.3* 11/02/2014        Eczema    Fatty liver disease, nonalcoholic 07/14/2014   Per Dr. Cato Mulligan Lab Results  Component Value Date   ALT 31 11/02/2014   AST 57* 11/02/2014    ALKPHOS 104 11/02/2014   BILITOT 1.1 11/02/2014       GERD (gastroesophageal reflux disease)    History of blood transfusion    History of kidney stones 2023   11/28/21 small stone currently, taking flomax   Hypertension    Iron deficiency anemia, unspecified 05/08/2023   Sepsis (HCC) 09/19/2017   Sepsis due to Escherichia coli (E. coli) (HCC)    Symptomatic anemia 06/06/2021   UTI (urinary tract infection) 09/19/2017   Vaginal discharge 04/01/2022   Assessment: 77yoF admitted to ICU following PEA arrest in ED, s/p 3 rounds of CPR. Initial hsTnI 484, now to >24k (CPR-related vs ischemic event). BNP at admission also elevated at 1372.6. Pharmacy consulted to initiate heparin infusion while awaiting cardiology recommendations and workup. No anticoagulation outpatient, last dose of  DVT prophylaxis with enoxaparin 3/18 at 1335.   Addendum 1450: plan for 48h medical management per Dr. Lynnette Caffey  PM - heparin level 0.79 on 750 units/hr.  Drawn appropriately.  No s/sx bleeding or infusion issues per RN.  Plan:  REDUCE heparin infusion to 600 units/hr Check anti-Xa level in 8 hours and daily while on heparin Continue to monitor H&H and platelets F/u stop on 3/21 @1030   Trixie Rude, PharmD Clinical Pharmacist 10/14/2023  7:31 PM

## 2023-10-14 NOTE — Progress Notes (Addendum)
 PHARMACY - ANTICOAGULATION CONSULT NOTE  Pharmacy Consult for heparin infusion Indication: chest pain/ACS  Allergies  Allergen Reactions   Bee Venom Swelling   Penicillins Hives   Ace Inhibitors Cough    ????   Chlorthalidone     REACTION: unspecified   Metformin     REACTION: gi side effects   Sulfamethoxazole     REACTION: questionable    Patient Measurements: Height: 5\' 2"  (157.5 cm) Weight: 67 kg (147 lb 11.3 oz) IBW/kg (Calculated) : 50.1 Heparin Dosing Weight: 63.9 kg  Vital Signs: Temp: 98.8 F (37.1 C) (03/19 0900) Temp Source: Bladder (03/19 0800) BP: 114/56 (03/19 0800) Pulse Rate: 86 (03/19 0900)  Labs: Recent Labs    10/13/23 0944 10/13/23 1815 10/13/23 1843 10/13/23 1845 10/13/23 1911 10/13/23 2023 10/13/23 2135 10/14/23 0106 10/14/23 0442  HGB 12.2   < >  --   --    < > 11.2*  --  10.9* 10.2*  HCT 37.6   < >  --   --    < > 33.0*  --  32.0* 30.3*  PLT 138*  --   --   --   --   --   --   --  92*  CREATININE 0.87  --   --  1.34*  --   --   --   --  1.19*  TROPONINIHS  --   --  484*  --   --   --  3,871*  --  >24,000*   < > = values in this interval not displayed.    Estimated Creatinine Clearance: 36.1 mL/min (A) (by C-G formula based on SCr of 1.19 mg/dL (H)).   Medical History: Past Medical History:  Diagnosis Date   Arthritis    Cancer (HCC)    breast left   Cerebrovascular accident [facility])    October 29 2021- Left eye- blind   COLONIC POLYPS, HX OF 12/10/2006   Qualifier: Diagnosis of  By: Cato Mulligan MD, Bruce     Diabetes mellitus type II, controlled (HCC) 12/10/2006   Poor control on Januvia 100mg  and glipizide 10mg  XL Lab Results  Component Value Date   HGBA1C 8.3* 11/02/2014        Eczema    Fatty liver disease, nonalcoholic 07/14/2014   Per Dr. Cato Mulligan Lab Results  Component Value Date   ALT 31 11/02/2014   AST 57* 11/02/2014   ALKPHOS 104 11/02/2014   BILITOT 1.1 11/02/2014       GERD (gastroesophageal reflux disease)    History of  blood transfusion    History of kidney stones 2023   11/28/21 small stone currently, taking flomax   Hypertension    Iron deficiency anemia, unspecified 05/08/2023   Sepsis (HCC) 09/19/2017   Sepsis due to Escherichia coli (E. coli) (HCC)    Symptomatic anemia 06/06/2021   UTI (urinary tract infection) 09/19/2017   Vaginal discharge 04/01/2022   Assessment: 77yoF admitted to ICU following PEA arrest in ED, s/p 3 rounds of CPR. Initial hsTnI 484, now to >24k (CPR-related vs ischemic event). BNP at admission also elevated at 1372.6. Pharmacy consulted to initiate heparin infusion while awaiting cardiology recommendations and workup. No anticoagulation outpatient, last dose of DVT prophylaxis with enoxaparin 3/18 at 1335.   Plan:  Start heparin infusion at 750 units/hr (roughly 12 u/kg/h) Check anti-Xa level in 8 hours and daily while on heparin Continue to monitor H&H and platelets F/u plans for medical management vs cardiology intervention  Rutherford Nail,  PharmD PGY2 Critical Care Pharmacy Resident 10/14/2023,9:37 AM  Addendum 1450: plan for 48h medical management per Dr. Lynnette Caffey

## 2023-10-14 NOTE — Consult Note (Addendum)
 Cardiology Consultation   Patient ID: Anita Mccormick MRN: [id number]; DOB: 1946/06/23  Admit date: 10/13/2023 Date of Consult: 10/14/2023  PCP:  Anita Saint, MD   [facility] Providers Cardiologist:  Anita Swaziland, MD    Patient Profile:   Anita Mccormick is a 78 y.o. female with a hx of HTN, HLD, CVA, DM, CKD 2, HFpEF, anemia, breast cancer, NAFLD/cirrhosis, COVID 09/17/2023 and tobacco abuse who is being seen 10/14/2023 for the evaluation of PEA at the request of Dr. Celine Mccormick.    History of Present Illness:   Anita Mccormick is a 27 YOF with above medical history.  Myocardial perfusion 2019: LVEF greater than 65%, normal study with no ST depression or T wave inversions  She was last seen in heart care office on 11/13/2017 with Dr. Swaziland for evaluation of edema and low BP.  BP readings at that time were higher since medications were held for hypotension. HR was elevated due to stopping beta-blocker, previously on atenolol 100 mg twice daily.  She was restarted on atenolol at 50 Mg, advised to monitor BP, and if BP remained high would resume losartan 25 mg.  Dr. Swaziland did not attribute swelling to CHF at that time noted improvement with Lasix every other day.   ECHO 05/2021: Basal septal hypertrophy.  EF 60 to 65%.  Mild LVH.  G1 DD.  LV and RV function normal.  Possible PFO, hypermobile septum.  Trivial MVR.  Aortic valve sclerosis.  She was last seen by vascular surgery 07/2022 for follow-up on lower extremity weakness.  Per Dr. Darletta Mccormick note based on biphasic Doppler signals and ABI, he did not attribute weakness to PAD.  It was also noted that moderate left carotid stenosis was stable based on duplex scan.   Presented to the ED with worsening SOB, orthopnea and wheezing.  She was recently diagnosed with COVID 08/2023.  Initial EKG sinus tach heart rate 109, R wave progression, LVH, hyperacute T wave in V3. on ED workup found to be flu a positive with initial 2 L [place] requirement  and elevated BP.  CXR was negative.  She was admitted to [facility] and then reportedly had a unwitnessed syncopal event going to the bathroom.  Per chart review she was found hypoxic in the 80s.  Later while awaiting bed placement, she was found to be in PEA bradycardic greater than asystolic arrest with ROSC after 3 rounds of ACLS and bicarb.  Post ROSC, jaw clenched, opening eyes but otherwise nonresponsive therefore intubated for airway protection.  IO placed due to IV infiltrations during RSI.  Post intubation CXR 10 % left pneumothorax, chest tube placed.  Repeat CXR resolution.   Troponins 484 > 3871 > 24,000.  BNP 1372. lactic Acid 6.1. Mg 1.5.  Initial EKG sinus tach heart rate 109, poor R wave progression, LVH, hyperacute T wave in V3. (Similar to EKG 09/2021)  Repeat EKG 10/13/23: Sinus tach, HR 137,ST depressio RBBB, n in lateral leads  Repeat EKG 10/14/23: NSR, heart rate 78, first-degree AV block, prolonged QT 474  Cardiology was consulted with a concern for cardiogenic shock.  Patient was still intubated upon arrival.  History was obtained from husband.  Per husband, patient has had shortness of breath with minimal exertion present for months.  Also patient has ongoing dizziness. Patient called husband on 10/13/2023 to tell him that she felt very short of breath, however he did not feel like she was struggling to breathe based on phone conversation.  Husband called the EMS and meet them at the house.  Upon his arrival he saw EMS given supplemental oxygen.  That has been followed the patient to the hospital and noted that oxygen supplementation continued.  He felt that shortness of breath had improved prior to him leaving hospital yesterday and denied she shared any cardiac complaining.  Shortly after her husband left hospital, he received a phone call that the patient arrested.  He reports occasional tobacco use and EtOH, denies any drugs.  Denies any family cardiac history.   Past Medical History:   Diagnosis Date   Arthritis    Cancer (HCC)    breast left   Cerebrovascular accident [facility])    October 29 2021- Left eye- blind   COLONIC POLYPS, HX OF 12/10/2006   Qualifier: Diagnosis of  By: Anita Mulligan MD, Anita     Diabetes mellitus type II, controlled (HCC) 12/10/2006   Poor control on Januvia 100mg  and glipizide 10mg  XL Lab Results  Component Value Date   HGBA1C 8.3* 11/02/2014        Eczema    Fatty liver disease, nonalcoholic 07/14/2014   Per Dr. Cato Mccormick Lab Results  Component Value Date   ALT 31 11/02/2014   AST 57* 11/02/2014   ALKPHOS 104 11/02/2014   BILITOT 1.1 11/02/2014       GERD (gastroesophageal reflux disease)    History of blood transfusion    History of kidney stones 2023   11/28/21 small stone currently, taking flomax   Hypertension    Iron deficiency anemia, unspecified 05/08/2023   Sepsis (HCC) 09/19/2017   Sepsis due to Escherichia coli (E. coli) (HCC)    Symptomatic anemia 06/06/2021   UTI (urinary tract infection) 09/19/2017   Vaginal discharge 04/01/2022    Past Surgical History:  Procedure Laterality Date   ARTERY BIOPSY Left 11/29/2021   Procedure: LEFT BIOPSY TEMPORAL ARTERY;  Surgeon: Anita Hint, MD;  Location: [facility] OR;  Service: Vascular;  Laterality: Left;   CATARACT EXTRACTION Bilateral    MASTECTOMY Left    TUBAL LIGATION       Home Medications:  Prior to Admission medications   Medication Sig Start Date End Date Taking? Authorizing Provider  aspirin EC 81 MG tablet Take 81 mg by mouth daily. Swallow whole.   Yes [provider]  atenolol (TENORMIN) 100 MG tablet TAKE 1 TABLET BY MOUTH TWICE A DAY 04/27/23  Yes Anita Saint, MD  Continuous Glucose Sensor (DEXCOM G7 SENSOR) MISC Apply a new sensor to skin every 10 days. 04/23/23  Yes Anita Saint, MD  Dulaglutide (TRULICITY) 1.5 MG/0.5ML SOAJ Inject 1.5 mg into the skin once a week.   Yes [provider]  ferrous gluconate (FERGON) 324 MG tablet Take 1 tablet (324 mg  total) by mouth daily. 04/23/23  Yes Anita Saint, MD  glipiZIDE (GLUCOTROL XL) 10 MG 24 hr tablet TAKE 1 TABLET BY MOUTH TWICE A DAY 09/28/23  Yes Anita Saint, MD  Multiple Vitamin (MULTIVITAMIN) tablet Take 1 tablet by mouth daily.   Yes [provider]  naproxen sodium (ALEVE) 220 MG tablet Take 220 mg by mouth daily as needed.   Yes [provider]  benzonatate (TESSALON) 100 MG capsule Take 1 capsule (100 mg total) by mouth 2 (two) times daily as needed for cough. Patient not taking: Reported on 10/13/2023 08/26/23   Anita Saint, MD  blood glucose meter kit and supplies KIT Dispense based on patient and insurance  preference. Use up to four times daily as directed. 06/05/21   Anita Saint, MD  Lancets [facility] ULTRASOFT) lancets Use as instructed 07/07/22   Anita Saint, MD    Inpatient Medications: Scheduled Meds:  arformoterol  15 mcg Nebulization BID   aspirin EC  81 mg Oral Daily   budesonide (PULMICORT) nebulizer solution  0.5 mg Nebulization BID   Chlorhexidine Gluconate Cloth  6 each Topical Daily   docusate  100 mg Per Tube BID   insulin aspart  0-15 Units Subcutaneous Q4H   methylPREDNISolone (SOLU-MEDROL) injection  40 mg Intravenous Q12H   mouth rinse  15 mL Mouth Rinse Q2H   oseltamivir  30 mg Per Tube Daily   pantoprazole (PROTONIX) IV  40 mg Intravenous Daily   polyethylene glycol  17 g Per Tube Daily   revefenacin  175 mcg Nebulization Daily   sodium chloride flush  3 mL Intravenous Q12H   Continuous Infusions:  azithromycin Stopped (10/14/23 1133)   cefTRIAXone (ROCEPHIN)  IV Stopped (10/14/23 0059)   fentaNYL infusion INTRAVENOUS 50 mcg/hr (10/14/23 1200)   heparin 750 Units/hr (10/14/23 1200)   lactated ringers     norepinephrine (LEVOPHED) Adult infusion 5 mcg/min (10/14/23 1200)   propofol (DIPRIVAN) infusion 25 mcg/kg/min (10/14/23 1200)   PRN Meds: acetaminophen **OR** acetaminophen, fentaNYL (SUBLIMAZE) injection,  fentaNYL (SUBLIMAZE) injection, hydrALAZINE, midazolam, mouth rinse  Allergies:    Allergies  Allergen Reactions   Bee Venom Swelling   Penicillins Hives   Ace Inhibitors Cough    ????   Chlorthalidone     REACTION: unspecified   Metformin     REACTION: gi side effects   Sulfamethoxazole     REACTION: questionable    Social History:   Social History   Socioeconomic History   Marital status: Married    Spouse name: Not on file   Number of children: Not on file   Years of education: Not on file   Highest education level: Not on file  Occupational History   Not on file  Tobacco Use   Smoking status: Some Days    Current packs/day: 0.20    Average packs/day: 0.2 packs/day for 10.0 years (2.0 ttl pk-yrs)    Types: Cigarettes   Smokeless tobacco: Never  Vaping Use   Vaping status: Never Used  Substance and Sexual Activity   Alcohol use: Not Currently   Drug use: No   Sexual activity: Not Currently  Other Topics Concern   Not on file  Social History Narrative   Married (husband Lollie Sails). 4 children. 11 grandkids. 1 greatgrandchild.       Still working as homemaker      Hobbies: playing cards, board games, cooking, Print production planner   Social Drivers of Health   Financial Resource Strain: Low Risk  (10/03/2022)   Overall Financial Resource Strain (CARDIA)    Difficulty of Paying Living Expenses: Not hard at all  Food Insecurity: No Food Insecurity (10/13/2023)   Hunger Vital Sign    Worried About Running Out of Food in the Last Year: Never true    Ran Out of Food in the Last Year: Never true  Transportation Needs: No Transportation Needs (10/13/2023)   PRAPARE - Administrator, Civil Service (Medical): No    Lack of Transportation (Non-Medical): No  Physical Activity: Inactive (10/03/2022)   Exercise Vital Sign    Days of Exercise per Week: 0 days    Minutes of Exercise per Session: 0 min  Stress:  No Stress Concern Present (10/03/2022)   [employer] of  Occupational Health - Occupational Stress Questionnaire    Feeling of Stress : Not at all  Social Connections: Moderately Isolated (10/13/2023)   Social Connection and Isolation Panel [NHANES]    Frequency of Communication with Friends and Family: Three times a week    Frequency of Social Gatherings with Friends and Family: Three times a week    Attends Religious Services: Never    Active Member of Clubs or Organizations: No    Attends Banker Meetings: Never    Marital Status: Married  Catering manager Violence: Not At Risk (10/13/2023)   Humiliation, Afraid, Rape, and Kick questionnaire    Fear of Current or Ex-Partner: No    Emotionally Abused: No    Physically Abused: No    Sexually Abused: No    Family History:   Family History  Problem Relation Age of Onset   Stroke Mother    Cancer Father        prostate   Diabetes Sister    Pancreatic cancer Sister      ROS:  Please see the history of present illness.  All other ROS reviewed and negative.     Physical Exam/Data:   Vitals:   10/14/23 1000 10/14/23 1100 10/14/23 1200 10/14/23 1300  BP:  (!) 83/53 (!) 110/58 115/63  Pulse: (!) 200  73 (!) 126  Resp: (!) 28 (!) 28 (!) 28 (!) 28  Temp: 98.6 F (37 C) 98.2 F (36.8 C) 97.9 F (36.6 C) (!) 97.5 F (36.4 C)  TempSrc:      SpO2: 98%  100% 100%  Weight:      Height:        Intake/Output Summary (Last 24 hours) at 10/14/2023 1402 Last data filed at 10/14/2023 1200 Gross per 24 hour  Intake 3905.79 ml  Output 954 ml  Net 2951.79 ml      10/13/2023    8:15 AM 08/26/2023    1:28 PM 07/06/2023    8:18 AM  Last 3 Weights  Weight (lbs) 147 lb 11.3 oz 149 lb 3.2 oz 148 lb 3.2 oz  Weight (kg) 67 kg 67.677 kg 67.223 kg     Body mass index is 27.02 kg/m.  General: Critically on chronically ill elderly AA female intubated HEENT: ETT secured  Neck: not evaluated  Vascular: No carotid bruits; Distal pulses 2+ bilaterally Cardiac:  Tachycardiac, regular   Lungs:  Mechanically vented, Good air movement anteriorly, unable to evaluate posteriorly  Abd: soft, nontender, no hepatomegaly  Ext: no edema Musculoskeletal:  No deformities, BUE and BLE strength normal and equal Skin: warm and dry  Neuro:  CNs 2-12 intact, no focal abnormalities noted Psych:  Normal affect   EKG:  The EKG was personally reviewed and demonstrates:   Initial EKG 10/13/23: sinus tach heart rate 109, R wave progression, LVH, hyperacute T wave in V3. (Similar to EKG 09/2021)  Repeat EKG 10/13/23: Sinus tach, HR 137, RBBB, ST depression in lateral leads  Repeat EKG 10/14/23: NSR, heart rate 78, first-degree AV block, prolonged QT 474 Telemetry:  Telemetry was personally reviewed and demonstrates:  NSR, HR 80-110's, occasionally PVCs  Relevant CV Studies: ECHO pending  Laboratory Data:  High Sensitivity Troponin:   Recent Labs  Lab 10/13/23 1843 10/13/23 2135 10/14/23 0442  TROPONINIHS 484* 3,871* >24,000*     Chemistry Recent Labs  Lab 10/13/23 0944 10/13/23 1815 10/13/23 1845 10/13/23 1911 10/13/23 2023 10/14/23 0106  10/14/23 0442  NA 137   < > 142   < > 140 140 138  K 4.0   < > 3.9   < > 4.1 3.4* 4.3  CL 105  --  101  --   --   --  105  CO2 24  --  21*  --   --   --  26  GLUCOSE 219*  --  306*  --   --   --  241*  BUN 9  --  11  --   --   --  13  CREATININE 0.87  --  1.34*  --   --   --  1.19*  CALCIUM 8.8*  --  8.7*  --   --   --  8.7*  MG  --   --  2.1  --   --   --  1.5*  GFRNONAA >60  --  41*  --   --   --  47*  ANIONGAP 8  --  20*  --   --   --  7   < > = values in this interval not displayed.    Recent Labs  Lab 10/13/23 1845 10/14/23 0442  PROT 6.6 5.4*  ALBUMIN 3.4* 2.6*  AST 58* 146*  ALT 27 44  ALKPHOS 69 49  BILITOT 0.9 0.7   Lipids  Recent Labs  Lab 10/14/23 0442  TRIG 50    Hematology Recent Labs  Lab 10/13/23 0944 10/13/23 1815 10/13/23 2023 10/14/23 0106 10/14/23 0442  WBC 6.9  --   --   --  7.8  RBC 3.52*  --    --   --  2.87*  HGB 12.2   < > 11.2* 10.9* 10.2*  HCT 37.6   < > 33.0* 32.0* 30.3*  MCV 106.8*  --   --   --  105.6*  MCH 34.7*  --   --   --  35.5*  MCHC 32.4  --   --   --  33.7  RDW 13.5  --   --   --  13.4  PLT 138*  --   --   --  92*   < > = values in this interval not displayed.   Thyroid No results for input(s): "TSH", "FREET4" in the last 168 hours.  BNP Recent Labs  Lab 10/13/23 0944 10/13/23 1853  BNP 205.1* 1,372.6*    DDimer  Recent Labs  Lab 10/13/23 0944  DDIMER 0.61*     Radiology/Studies:  DG Chest Port 1 View Result Date: 10/14/2023 CLINICAL DATA:  Left pneumothorax, chest tube EXAM: PORTABLE CHEST 1 VIEW COMPARISON:  10/13/2023 FINDINGS: Interval placement of left chest tube with re-expansion of the left lung. No visible pneumothorax. Subcutaneous emphysema in the left chest wall. No confluent airspace opacities. Endotracheal tube, right central line and NG tube are unchanged. IMPRESSION: Interval placement of left chest tube with re-expansion of the left lung. No visible residual pneumothorax. Left chest wall subcutaneous emphysema. Electronically Signed   By: Charlett Nose M.D.   On: 10/14/2023 00:18   DG CHEST PORT 1 VIEW Result Date: 10/13/2023 CLINICAL DATA:  Encounter for central line placement. Follow-up left pneumothorax. [id number]. EXAM: PORTABLE CHEST 1 VIEW COMPARISON:  PA and lateral chest earlier today, more recent portable chest today at 6:28 p.m. FINDINGS: 9:06 p.m. ETT tip is 3.1 cm from the carina. NGT likely terminates in the body of stomach based on the position of the side  hole although the tip is not in the field. There is a new right IJ central line terminating about the superior cavoatrial junction. No right pneumothorax. A left pneumothorax is again noted and extends from the apex to the base. There is evidence of tension pneumothorax with depression of the left hemidiaphragm and slight shift of mediastinum to the right. The pneumothorax is  estimated up to 30% of the left chest volume. This is not notably changed from the last film. No pleural fluid is seen. There is patchy right upper lobe airspace disease again noted with remaining lungs clear. The cardiomediastinal silhouette and central vessels are normal. There is calcification of the transverse aorta. Advanced degenerative change thoracic spine. Recent fracture posterolateral left sixth rib is again noted. IMPRESSION: 1. New right IJ central line terminating about the superior cavoatrial junction. No right pneumothorax. 2. Left tension pneumothorax estimated up to 30% of the left chest volume, with depressed left hemidiaphragm and slight mediastinal shift. This is not notably changed from the last film. 3. Patchy right upper lobe airspace disease. 4. ETT and NGT as above. 5. Recent fracture posterolateral left sixth rib. 6. These results will be called to the ordering clinician or representative by the Radiologist Assistant, and communication documented in the PACS or [employer]. Electronically Signed   By: Almira Bar M.D.   On: 10/13/2023 22:19   DG Chest Portable 1 View Result Date: 10/13/2023 CLINICAL DATA:  Status post intubation. EXAM: PORTABLE CHEST 1 VIEW COMPARISON:  October 13, 2023 (10:08 a.m.) FINDINGS: Endotracheal tube is seen with its distal tip approximately 5.3 cm from the carina. An enteric tube is noted with its distal end extending below the level of the diaphragm. The heart size and mediastinal contours are within normal limits. Mild, hazy airspace disease is seen overlying the upper right lung. This represents a new finding when compared to the prior study. No pleural effusion is identified. There is a small left-sided pneumothorax. This extends from the left apex to the lower left lung and represents a new finding. Acute sixth left rib fracture is seen. IMPRESSION: 1. Endotracheal tube and enteric tube positioning, as described above. 2. Small left-sided  pneumothorax. 3. Mild, hazy right upper lobe airspace disease. 4. Acute sixth left rib fracture. Electronically Signed   By: Aram Candela M.D.   On: 10/13/2023 19:54   DG Chest 2 View Result Date: 10/13/2023 CLINICAL DATA:  Shortness of breath.  Cough EXAM: CHEST - 2 VIEW COMPARISON:  X-ray 07/06/2023. FINDINGS: No consolidation, pneumothorax or effusion. Normal cardiopericardial silhouette. Calcified aorta. Overlapping cardiac leads. Degenerative changes seen along the spine. The extreme inferior posterior costophrenic angles are clipped off the edge of the film on the lateral view. IMPRESSION: No acute cardiopulmonary disease. Electronically Signed   By: Karen Kays M.D.   On: 10/13/2023 11:06    Assessment and Plan:   NSTEMI  Elevated troponin  - Initial EKG 10/13/23: sinus tach heart rate 109, R wave progression, LVH, hyperacute T wave in V3. (Similar to EKG 09/2021)  - Repeat EKG 10/13/23: Sinus tach, HR 137, RBBB, ST depression in lateral leads  - Repeat EKG 10/14/23: NSR, heart rate 78, first-degree AV block, prolonged QT 474 - Troponins 484 > 3871 > 24,000. - Even though troponins elevated, suspect demand ischemia with no ischemic changes seen on EKGs - ECHO pending. Will closely follow for further evaluation (Vs 05/2021: Basal septal hypertrophy.  EF 60 to 65%.  Mild LVH.  G1 DD.  LV and RV function normal.  Possible PFO, hypermobile septum.  Trivial MVR.  Aortic valve sclerosis.) - If significant cardiomyopathy, would consider ischemic evaluation once neurological function regained.  - Currently on ASA 81 mg, heparin gtt, Levophed   PEA, ROSC Lactic Acidosis - Troponins 484 > 3871 > 24,000.  - Lactic acid 11 > 3.2  (Treated with bicarb and LR) - CO-OX venous O2 sat 74 %  - CVP: [5 mmHg-20 mmHg] 12 mmHg  - Although, patient requiring vasopressors, no clear evidence of cardiogenic shock at this time. - Echo pending. See above.    Syncope - Per chart review, there was an  unwitnessed syncopal episode, which led to PEA.  - Husband reports multiple pre-syncope episodes in the past  - unable to evaluate history from patient for further investigations - unsure of exact cause, will continue to monitor telemetry   Hypomagnesemia  - K 4.3, Mg 1.5, treated with Mg sufate. Will follow   Acute hypoxic and hypercarbic respiratory failure Flu A COPD exacerbation - Respiratory failure secondary to flu and PNA.  Positive wheezing -Currently on triple therapy plus continuous as needed DuoNebs, IV ceftriaxone and azithromycin  Left pneumothorax, resolved -  CXR post-intubation: Left pneumothorax 10%, chest tube in place - Placed left pigtail chest tube, patient stable, and post CXR showing left lung reinflated  HTN HLD  HFpEF  - currently holding antihypertensives in setting of PEA  - echo pending. See above  Risk Assessment/Risk Scores:   TIMI Risk Score for Unstable Angina or Non-ST Elevation MI:   The patient's TIMI risk score is 3, which indicates a 13% risk of all cause mortality, new or recurrent myocardial infarction or need for urgent revascularization in the next 14 days.{  For questions or updates, please contact [facility] Please consult [email or web address] for contact info under    Signed, Basilio Cairo, PA-C  10/14/2023 2:02 PM   ATTENDING ATTESTATION:  After conducting a review of all available clinical information with the care team, interviewing the patient, and performing a physical exam, I agree with the findings and plan described in this note.   GEN: Intubated sedated HEENT: Elevated Cardiac: RRR, no murmurs, rubs, or gallops.  Respiratory: Clear anteriorly, left chest tube in place GI: Soft, nontender, non-distended  MS: No edema; No deformity. Neuro:  Nonfocal  Vasc:  +2 radial pulses, warm and well-perfused  The patient is a 78 year old female with a history of hypertension, hyperlipidemia, type 2 diabetes, CKD stage  II, and breast cancer who suffered a PEA arrest due to a pneumothorax requiring CPR with ROSC.  She had a chest tube placed.  She remains intubated and on pressors.  Of note her pulse pressure is not low.  Her lactic acid was 11 and is decreasing and is now 6.1.  Her co- oximetry level is greater than 70% and she has a normal pulse pressure.  Echocardiogram was performed that I reviewed which demonstrates an EF of approximately 35 to 40%.  Of note her LFTs are within normal limits.  Given this constellation of clinical data I do not think the patient has developed cardiogenic shock.  Her troponin is elevated and if she recovers and is neurologically intact, coronary angiography could be pursued.  Otherwise, continue supportive care.  Alverda Skeans, MD Pager [phone]

## 2023-10-14 NOTE — Progress Notes (Signed)
  Echocardiogram 2D Echocardiogram has been performed.  Janalyn Harder 10/14/2023, 1:34 PM

## 2023-10-14 NOTE — TOC Initial Note (Signed)
 Transition of Care [facility]) - Initial/Assessment Note    Patient Details  Name: Anita Mccormick MRN: [id number] Date of Birth: 07/02/1946  Transition of Care [facility]) CM/SW Contact:    Lawerance Sabal, RN Phone Number: 10/14/2023, 8:47 AM  Clinical Narrative:                  Patient admitted from home, lives w spouse.  + Flu, coded, intubated, admitted to ICU. TOC will continue to follow through progression rounds.       Barriers to Discharge: Continued Medical Work up   Patient Goals and CMS Choice            Expected Discharge Plan and Services                                              Prior Living Arrangements/Services                       Activities of Daily Living   ADL Screening (condition at time of admission) Independently performs ADLs?: No Does the patient have a NEW difficulty with bathing/dressing/toileting/self-feeding that is expected to last >3 days?: No (needs assist) Does the patient have a NEW difficulty with getting in/out of bed, walking, or climbing stairs that is expected to last >3 days?: No (needs assist) Does the patient have a NEW difficulty with communication that is expected to last >3 days?: No Is the patient deaf or have difficulty hearing?: No Does the patient have difficulty seeing, even when wearing glasses/contacts?: No Does the patient have difficulty concentrating, remembering, or making decisions?: No  Permission Sought/Granted                  Emotional Assessment              Admission diagnosis:  Cardiac arrest (HCC) [I46.9] Influenza A [J10.1] Hypertensive urgency [I16.0] Acute respiratory failure with hypoxia (HCC) [J96.01] Patient Active Problem List   Diagnosis Date Noted   Acute respiratory failure with hypoxia (HCC) 10/13/2023   Influenza A 10/13/2023   Cardiac arrest (HCC) 10/13/2023   Pneumothorax on left 10/13/2023   Central retinal artery occlusion of right eye 10/25/2021    Atherosclerosis of aorta (HCC) 05/18/2020   Cirrhosis of liver without ascites (HCC) 05/18/2020   Internal hemorrhoids 06/22/2019   Anemia    Chronic kidney disease (CKD), stage II (mild) 09/19/2017   Leukopenia 09/19/2017   Chronic diastolic CHF (congestive heart failure) (HCC) 09/19/2017   Statins contraindicated 06/12/2017   Hepatotoxicity due to statin drug 06/12/2017   History of adenomatous polyp of colon 03/18/2017   Viral URI with cough 01/17/2017   Thrombocytopenia (HCC) 12/19/2015   Insomnia 08/21/2015   Nicotine use disorder 05/21/2015   Eczema 05/21/2015   Fatty liver disease, nonalcoholic 07/14/2014   Hyperlipidemia 11/03/2007   History of CVA (cerebrovascular accident) 10/20/2007   Type II diabetes mellitus with renal manifestations (HCC) 12/10/2006   Essential hypertension 12/10/2006   History of breast cancer 12/10/2006   PCP:  Deeann Saint, MD Pharmacy:   [facility] [phone] Ginette Otto,  - [address] AT [facility] [address] [place] [address] Phone: [phone] Fax: [phone]  [facility] [place], [place] - [address] [place] [address]  Phone: [phone] Fax: [phone]  [facility] [address] [place] [address] Phone: [phone] Fax: [phone]     Social Drivers of Health (SDOH) Social History: SDOH Screenings   Food Insecurity: No Food Insecurity (10/13/2023)  Housing: Low Risk  (10/13/2023)  Transportation Needs: No Transportation Needs (10/13/2023)  Utilities: Not At Risk (10/13/2023)  Alcohol Screen: Low Risk  (10/03/2022)  Depression (PHQ2-9): Low Risk  (07/06/2023)  Financial Resource Strain: Low Risk  (10/03/2022)  Physical Activity: Inactive (10/03/2022)  Social Connections: Moderately Isolated (10/13/2023)  Stress: No Stress Concern Present (10/03/2022)  Tobacco Use: High  Risk (10/13/2023)   SDOH Interventions:     Readmission Risk Interventions     No data to display

## 2023-10-15 ENCOUNTER — Inpatient Hospital Stay (HOSPITAL_COMMUNITY)

## 2023-10-15 DIAGNOSIS — J09X2 Influenza due to identified novel influenza A virus with other respiratory manifestations: Secondary | ICD-10-CM | POA: Diagnosis not present

## 2023-10-15 DIAGNOSIS — I469 Cardiac arrest, cause unspecified: Secondary | ICD-10-CM | POA: Diagnosis not present

## 2023-10-15 DIAGNOSIS — R7989 Other specified abnormal findings of blood chemistry: Secondary | ICD-10-CM | POA: Diagnosis not present

## 2023-10-15 DIAGNOSIS — J9601 Acute respiratory failure with hypoxia: Secondary | ICD-10-CM | POA: Diagnosis not present

## 2023-10-15 LAB — CBC
HCT: 30.4 % — ABNORMAL LOW (ref 36.0–46.0)
Hemoglobin: 10.2 g/dL — ABNORMAL LOW (ref 12.0–15.0)
MCH: 34.9 pg — ABNORMAL HIGH (ref 26.0–34.0)
MCHC: 33.6 g/dL (ref 30.0–36.0)
MCV: 104.1 fL — ABNORMAL HIGH (ref 80.0–100.0)
Platelets: 129 10*3/uL — ABNORMAL LOW (ref 150–400)
RBC: 2.92 MIL/uL — ABNORMAL LOW (ref 3.87–5.11)
RDW: 14 % (ref 11.5–15.5)
WBC: 15.8 10*3/uL — ABNORMAL HIGH (ref 4.0–10.5)
nRBC: 0 % (ref 0.0–0.2)

## 2023-10-15 LAB — GLUCOSE, CAPILLARY
Glucose-Capillary: 183 mg/dL — ABNORMAL HIGH (ref 70–99)
Glucose-Capillary: 177 mg/dL — ABNORMAL HIGH (ref 70–99)
Glucose-Capillary: 178 mg/dL — ABNORMAL HIGH (ref 70–99)
Glucose-Capillary: 192 mg/dL — ABNORMAL HIGH (ref 70–99)
Glucose-Capillary: 246 mg/dL — ABNORMAL HIGH (ref 70–99)
Glucose-Capillary: 253 mg/dL — ABNORMAL HIGH (ref 70–99)

## 2023-10-15 LAB — BASIC METABOLIC PANEL
Anion gap: 8 (ref 5–15)
BUN: 20 mg/dL (ref 8–23)
CO2: 24 mmol/L (ref 22–32)
Calcium: 8.6 mg/dL — ABNORMAL LOW (ref 8.9–10.3)
Chloride: 106 mmol/L (ref 98–111)
Creatinine, Ser: 1.22 mg/dL — ABNORMAL HIGH (ref 0.44–1.00)
GFR, Estimated: 46 mL/min — ABNORMAL LOW (ref >60)
Glucose, Bld: 183 mg/dL — ABNORMAL HIGH (ref 70–99)
Potassium: 4.6 mmol/L (ref 3.5–5.1)
Sodium: 138 mmol/L (ref 135–145)

## 2023-10-15 LAB — STREP PNEUMONIAE URINARY ANTIGEN: Strep Pneumo Urinary Antigen: NEGATIVE

## 2023-10-15 LAB — PROCALCITONIN: Procalcitonin: 1.35 ng/mL

## 2023-10-15 LAB — HEPARIN LEVEL (UNFRACTIONATED): Heparin Unfractionated: 0.68 [IU]/mL (ref 0.30–0.70)

## 2023-10-15 LAB — MAGNESIUM: Magnesium: 2.7 mg/dL — ABNORMAL HIGH (ref 1.7–2.4)

## 2023-10-15 MED ORDER — OSMOLITE 1.5 CAL PO LIQD
1000.0000 mL | ORAL | Status: DC
  Administered 2023-10-15 (×2): 1000 mL
  Filled 2023-10-15 (×2): qty 1000

## 2023-10-15 MED ORDER — PROSOURCE TF20 ENFIT COMPATIBL EN LIQD
60.0000 mL | Freq: Every day | ENTERAL | Status: DC
  Administered 2023-10-15 – 2023-10-19 (×4): 60 mL
  Filled 2023-10-15 (×4): qty 60

## 2023-10-15 MED ORDER — ATORVASTATIN CALCIUM 40 MG PO TABS
80.0000 mg | ORAL_TABLET | Freq: Every day | ORAL | Status: DC
  Administered 2023-10-15 – 2023-10-16 (×2): 80 mg
  Filled 2023-10-15 (×2): qty 2

## 2023-10-15 MED ORDER — HEPARIN SODIUM (PORCINE) 5000 UNIT/ML IJ SOLN
5000.0000 [IU] | Freq: Three times a day (TID) | INTRAMUSCULAR | Status: DC
  Administered 2023-10-15 – 2023-10-20 (×15): 5000 [IU] via SUBCUTANEOUS
  Filled 2023-10-15 (×16): qty 1

## 2023-10-15 MED ORDER — METHYLPREDNISOLONE SODIUM SUCC 40 MG IJ SOLR
40.0000 mg | INTRAMUSCULAR | Status: DC
  Administered 2023-10-16 – 2023-10-17 (×2): 40 mg via INTRAVENOUS
  Filled 2023-10-15 (×2): qty 1

## 2023-10-15 MED ORDER — OSELTAMIVIR PHOSPHATE 30 MG PO CAPS
30.0000 mg | ORAL_CAPSULE | Freq: Two times a day (BID) | ORAL | Status: DC
  Administered 2023-10-15 – 2023-10-16 (×4): 30 mg
  Filled 2023-10-15 (×5): qty 1

## 2023-10-15 MED ORDER — ASPIRIN 81 MG PO CHEW
81.0000 mg | CHEWABLE_TABLET | Freq: Every day | ORAL | Status: DC
  Administered 2023-10-15 – 2023-10-16 (×2): 81 mg
  Filled 2023-10-15 (×2): qty 1

## 2023-10-15 MED ORDER — LIDOCAINE 5 % EX PTCH
1.0000 | MEDICATED_PATCH | CUTANEOUS | Status: AC
  Administered 2023-10-15 – 2023-10-19 (×5): 1 via TRANSDERMAL
  Filled 2023-10-15 (×5): qty 1

## 2023-10-15 NOTE — Progress Notes (Signed)
 PHARMACY - ANTICOAGULATION CONSULT NOTE  Pharmacy Consult for heparin Indication:  medical mgmt of NSTEMI  Labs: Recent Labs    10/13/23 0944 10/13/23 1815 10/13/23 1845 10/13/23 1911 10/13/23 2135 10/14/23 0106 10/14/23 0442 10/14/23 1401 10/14/23 1800 10/14/23 1942 10/15/23 0420  HGB 12.2   < >  --    < >  --    < > 10.2*  --   --  10.5* 10.2*  HCT 37.6   < >  --    < >  --    < > 30.3*  --   --  31.0* 30.4*  PLT 138*  --   --   --   --   --  92*  --   --   --  129*  HEPARINUNFRC  --   --   --   --   --   --   --   --  0.79*  --  0.68  CREATININE 0.87  --  1.34*  --   --   --  1.19*  --   --   --   --   TROPONINIHS  --    < >  --   --  9,629*  --  >24,000* >24,000*  --   --   --    < > = values in this interval not displayed.   Assessment/Plan:  78yo female therapeutic on heparin after rate change. Will continue infusion at current rate of 600 units/hr and confirm stable with additional level.  Vernard Gambles, PharmD, BCPS 10/15/2023 5:18 AM

## 2023-10-15 NOTE — Progress Notes (Signed)
 Ventilator patient transported from 2M07 to CT3 and back without any complications

## 2023-10-15 NOTE — Progress Notes (Addendum)
 Initial Nutrition Assessment  DOCUMENTATION CODES:   Non-severe (moderate) malnutrition in context of acute illness/injury  INTERVENTION:   Initiate tube feeding via OGT: Osmolite 1.5 at 20 ml/h and increase by 10 ml every 6 hours until goal 45 ml/hr (1080 ml per day) Prosource TF20 60 ml daily  Provides 1700 kcal, 87 gm protein, 823 ml free water daily  Recommend Q4H insulin for tube feeding coverage  100 mg Thiamine daily  NUTRITION DIAGNOSIS:   Moderate Malnutrition related to acute illness as evidenced by mild fat depletion, mild muscle depletion.   GOAL:   Patient will meet greater than or equal to 90% of their needs   MONITOR:   TF tolerance, Diet advancement, I & O's, Labs  REASON FOR ASSESSMENT:   Ventilator    ASSESSMENT:  78 y.o female with PMH of breast cancer, HTN, HLD, CVA, left eye blindness, T2DM, FALD/cirrhosis, GERD, anemia, CKD 2, Covid 08/2023. Presented with worsening SOB. Found to be Influenza A positive. While awaiting bed placement had cardiac arrest and was intubated.  3/18 - Intubated    Patient intubated, on ventilator support. No family beside. Per RN, failed SBT and will not extubate today. Ok to start tube feeds per MD. Patient does have mild fat and muscle wasting but exam limited due to mitts and arm cuffs on patient. Some edema noted on RLE. Based on weight history, weight seems stable. At slight risk for refeeding, add thiamine.   MV: 10.7 L/min Temp (24hrs), Avg:98 F (36.7 C), Min:96.4 F (35.8 C), Max:99.3 F (37.4 C)  MAP (A-line): 90 mmHg Levo: 11.25 ml/hr Propofol: 12.06 ml/hr   Admit weight: 67 kg  Current weight: 67 kg   Intake/Output Summary (Last 24 hours) at 10/15/2023 1311 Last data filed at 10/15/2023 1200 Gross per 24 hour  Intake 1020.93 ml  Output 965 ml  Net 55.93 ml   Net IO Since Admission: 3,019.13 mL [10/15/23 1311]  Drains/Lines: Chest tube 10 ml x 24 hours   Nutritionally Relevant  Medications: Scheduled Meds:  docusate  100 mg Per Tube BID   feeding supplement (PROSource TF20)  60 mL Per Tube Daily   heparin injection (subcutaneous)  5,000 Units Subcutaneous Q8H   insulin aspart  0-15 Units Subcutaneous Q4H   Continuous Infusions:  azithromycin 250 mL/hr at 10/15/23 1200   cefTRIAXone (ROCEPHIN)  IV Stopped (10/15/23 0004)   feeding supplement (OSMOLITE 1.5 CAL)     fentaNYL infusion INTRAVENOUS 50 mcg/hr (10/15/23 1242)   lactated ringers     norepinephrine (LEVOPHED) Adult infusion 3 mcg/min (10/15/23 1200)   propofol (DIPRIVAN) infusion 30 mcg/kg/min (10/15/23 1200)   Labs Reviewed: Creatinine 1.22, Calcium 8.6, Magnesium 2.7  CBG ranges from 149-193 mg/dL over the last 24 hours HgbA1c 5  Flowsheet Row Most Recent Value  Orbital Region Mild depletion  Upper Arm Region Unable to assess  [Arm cuffs]  Thoracic and Lumbar Region No depletion  Buccal Region Unable to assess  Temple Region Moderate depletion  Clavicle Bone Region No depletion  Clavicle and Acromion Bone Region No depletion  Scapular Bone Region No depletion  Dorsal Hand Unable to assess  [Mitts]  Patellar Region Mild depletion  Anterior Thigh Region Mild depletion  Posterior Calf Region Mild depletion  Edema (RD Assessment) Mild  Hair Reviewed  [Bald]  Eyes Reviewed  Mouth Unable to assess  Skin Reviewed  [dry skin on heel]  Nails Unable to assess      Diet Order:   Diet Order  Diet NPO time specified  Diet effective now                   EDUCATION NEEDS:   Not appropriate for education at this time  Skin:  Skin Assessment: Reviewed RN Assessment  Last BM:  PTA  Height:   Ht Readings from Last 1 Encounters:  10/13/23 5\' 2"  (1.575 m)    Weight:   Wt Readings from Last 1 Encounters:  10/13/23 67 kg    Ideal Body Weight:  50 kg  BMI:  Body mass index is 27.02 kg/m.  Estimated Nutritional Needs:   Kcal:  1500-1700 kcal  Protein:  75-95  gm  Fluid:  >1.5L/day   Elliot Dally, RD Registered Dietitian  See Amion for more information

## 2023-10-15 NOTE — Progress Notes (Addendum)
 NAME:  Anita Mccormick, MRN:  [id number], DOB:  March 28, 1946, LOS: 2 ADMISSION DATE:  10/13/2023, CONSULTATION DATE:  10/13/23 REFERRING MD:  Dr. Alinda Money, CHIEF COMPLAINT:  SOB   History of Present Illness:  Pt encephalopathic, therefore HPI obtained from EMR.   37 yoF w/ PMH of tobacco abuse, HTN, HLD, CVA, DM, CKD2, HFpEF, anemia, breast cancer, NAFLD/ cirrhosis, and COVID 08/2023 presenting with worsening SOB, orthopnea and wheezing.  Residual cough and SOB since COVID.    On workup, found to be flu A positive, with initial 2L [place] requirement, and elevated Bps.  CXR neg.  Admitted to [facility] but had syncopal event going to the bathroom and reported she occasionally has them.  Found to be more hypoxic in the 80's. Later while awaiting bed placement, found to be in PEA bradycardic > asystolic arrest with ROSC after 3 rounds of ACLS measures and bicarb.  Post ROSC, jaw clenched, opening eyes but otherwise non responsive therefore intubated for airway protection.  IO placed due to IV infiltration during RSI.  PCCM called for further medical management.    Pertinent  Medical History  Tobacco abuse, HTN, HLD, CVA, DM, CKD2, HFpEF, anemia, breast cancer, NASH cirrhosis COVID 08/2023  Significant Hospital Events: Including procedures, antibiotic start and stop dates in addition to other pertinent events   3/18 admit to [facility] Flu PNA. Syncope. Coded. Tube after. Admit ICU   Interim History / Subjective:  SBT, tentative plan for extubation.  Objective   Blood pressure (!) 110/53, pulse 64, temperature 98.8 F (37.1 C), resp. rate (!) 28, height 5\' 2"  (1.575 m), weight 67 kg, SpO2 100%.  CVP:  [7 mmHg-14 mmHg] 8 mmHg  Vent Mode: PRVC FiO2 (%):  [40 %] 40 % Set Rate:  [28 bmp] 28 bmp Vt Set:  [400 mL] 400 mL PEEP:  [5 cmH20] 5 cmH20 Plateau Pressure:  [18 cmH20-23 cmH20] 18 cmH20   Fentayl 50 mcg Norepi 2 mcg Propofol 45  Intake/Output Summary (Last 24 hours) at 10/15/2023 0634 Last data filed at  10/15/2023 0600 Gross per 24 hour  Intake 1215.99 ml  Output 990 ml  Net 225.99 ml   Filed Weights   10/13/23 0815  Weight: 67 kg   Labs. WBC 15.8, Hb 10.2 Scr 1.22 Bcx 140-180  Examination: General: critically and chronically ill elderly F intubated  HENT: ETT secure  Lungs: Poor air movement. Bilat wheezes. Mechanically ventilated  Cardiovascular: tachycardic  Abdomen: soft ndnt  Extremities: warm Neuro: alert, able to follow commands.  Resolved Hospital Problem list    Assessment & Plan:   PEA cardiac arrest with ROSC after 3 rounds CPR Elevated Troponin Type II MI  Syncope Cardiac arrest secondary to hypoxia vs. COPD/flu. Troponin remains elevated, likely in the setting of ongoing critical illness.   Echo showed LVEF of 60 to 65%, no valvular issues. Will need ischemic evaluation (cath) once extubated depending on neurologic function.   -Cardiology following, appreciate recs. -Stop heparin drips. -ASA 81 mg -Atorvastatin 80 mg -Follow-up procalcitonin. -Follow-up CT of the head  # Acute hypoxic and hypercarbic respiratory failure # Flu A. ? COPD Exacerbation  Respiratory failure secondary to flu, and pneumonia.    - triple therapy + cont PRN duonebs - #2/5  ceftriaxone, 2/3 azithromycin -  Tamiflu for 10 days ( #3/10) - Decrease Solu-Medrol from 80 to 40 mg  Left pneumothorax -Maintain chest tube to suction  HTN HLD HFpEF -Holding  antihypertensive.  DMT2 Elevated fasting glucose secondary  to steroids. - CBG q4 w/ prn SSI   Best Practice (right click and "Reselect all SmartList Selections" daily)  Diet/type: NPO DVT prophylaxis LMWH Pressure ulcer(s): pressure ulcer assessment deferred  GI prophylaxis: PPI Lines: RLE IO Foley:  Yes, and it is still needed Code Status:  full code Last date of multidisciplinary goals of care discussion [--] Husband updated post arrest by hospitalist, remains full code/full scope of offered care  Labs    CBC: Recent Labs  Lab 10/13/23 0944 10/13/23 1815 10/13/23 2023 10/14/23 0106 10/14/23 0442 10/14/23 1942 10/15/23 0420  WBC 6.9  --   --   --  7.8  --  15.8*  NEUTROABS 5.8  --   --   --   --   --   --   HGB 12.2   < > 11.2* 10.9* 10.2* 10.5* 10.2*  HCT 37.6   < > 33.0* 32.0* 30.3* 31.0* 30.4*  MCV 106.8*  --   --   --  105.6*  --  104.1*  PLT 138*  --   --   --  92*  --  129*   < > = values in this interval not displayed.    Basic Metabolic Panel: Recent Labs  Lab 10/13/23 0944 10/13/23 1815 10/13/23 1845 10/13/23 1911 10/13/23 2023 10/14/23 0106 10/14/23 0442 10/14/23 1942 10/15/23 0420  NA 137   < > 142   < > 140 140 138 140 138  K 4.0   < > 3.9   < > 4.1 3.4* 4.3 4.8 4.6  CL 105  --  101  --   --   --  105  --  106  CO2 24  --  21*  --   --   --  26  --  24  GLUCOSE 219*  --  306*  --   --   --  241*  --  183*  BUN 9  --  11  --   --   --  13  --  20  CREATININE 0.87  --  1.34*  --   --   --  1.19*  --  1.22*  CALCIUM 8.8*  --  8.7*  --   --   --  8.7*  --  8.6*  MG  --   --  2.1  --   --   --  1.5*  --   --   PHOS  --   --   --   --   --   --  3.0  --   --    < > = values in this interval not displayed.   GFR: Estimated Creatinine Clearance: 35.2 mL/min (A) (by C-G formula based on SCr of 1.22 mg/dL (H)). Recent Labs  Lab 10/13/23 0944 10/13/23 1816 10/13/23 2022 10/13/23 2135 10/14/23 0442 10/14/23 1105 10/14/23 1400 10/15/23 0420  WBC 6.9  --   --   --  7.8  --   --  15.8*  LATICACIDVEN  --    < > 6.3* 6.1*  --  3.2* 2.2*  --    < > = values in this interval not displayed.    Liver Function Tests: Recent Labs  Lab 10/13/23 1845 10/14/23 0442  AST 58* 146*  ALT 27 44  ALKPHOS 69 49  BILITOT 0.9 0.7  PROT 6.6 5.4*  ALBUMIN 3.4* 2.6*   No results for input(s): "LIPASE", "AMYLASE" in the last 168 hours. No results for input(s): "  AMMONIA" in the last 168 hours.  ABG    Component Value Date/Time   PHART 7.376 10/14/2023 1942   PCO2ART  45.8 10/14/2023 1942   PO2ART 109 (H) 10/14/2023 1942   HCO3 27.0 10/14/2023 1942   TCO2 28 10/14/2023 1942   ACIDBASEDEF 1.0 10/14/2023 0106   O2SAT 98 10/14/2023 1942     Coagulation Profile: No results for input(s): "INR", "PROTIME" in the last 168 hours.  Cardiac Enzymes: No results for input(s): "CKTOTAL", "CKMB", "CKMBINDEX", "TROPONINI" in the last 168 hours.  HbA1C: Hgb A1c MFr Bld  Date/Time Value Ref Range Status  10/13/2023 06:53 PM 5.0 4.8 - 5.6 % Final    Comment:    (NOTE) Pre diabetes:          5.7%-6.4%  Diabetes:              >6.4%  Glycemic control for   <7.0% adults with diabetes   04/23/2023 12:33 PM 6.4 4.6 - 6.5 % Final    Comment:    Glycemic Control Guidelines for People with Diabetes:Non Diabetic:  <6%Goal of Therapy: <7%Additional Action Suggested:  >8%     CBG: Recent Labs  Lab 10/14/23 1123 10/14/23 1545 10/14/23 1932 10/14/23 2328 10/15/23 0331  GLUCAP 159* 193* 167* 149* 183*    Review of Systems:   Unable to obtain, intubatred   Past Medical History:  CVA, type 2 diabetes, hypertension, iron deficiency anemia Surgical History:   Past Surgical History:  Procedure Laterality Date   ARTERY BIOPSY Left 11/29/2021   Procedure: LEFT BIOPSY TEMPORAL ARTERY;  Surgeon: Chuck Hint, MD;  Location: [facility] OR;  Service: Vascular;  Laterality: Left;   CATARACT EXTRACTION Bilateral    MASTECTOMY Left    TUBAL LIGATION       Social History:   reports that she has been smoking cigarettes. She has a 2 pack-year smoking history. She has never used smokeless tobacco. She reports that she does not currently use alcohol. She reports that she does not use drugs.   Family History:  Her family history includes Cancer in her father; Diabetes in her sister; Pancreatic cancer in her sister; Stroke in her mother.   Allergies Allergies  Allergen Reactions   Bee Venom Swelling   Penicillins Hives   Ace Inhibitors Cough    ????    Chlorthalidone     REACTION: unspecified   Metformin     REACTION: gi side effects   Sulfamethoxazole     REACTION: questionable     Home Medications  Prior to Admission medications   Medication Sig Start Date End Date Taking? Authorizing Provider  aspirin EC 81 MG tablet Take 81 mg by mouth daily. Swallow whole.   Yes [provider]  atenolol (TENORMIN) 100 MG tablet TAKE 1 TABLET BY MOUTH TWICE A DAY 04/27/23  Yes Deeann Saint, MD  Continuous Glucose Sensor (DEXCOM G7 SENSOR) MISC Apply a new sensor to skin every 10 days. 04/23/23  Yes Deeann Saint, MD  Dulaglutide (TRULICITY) 1.5 MG/0.5ML SOAJ Inject 1.5 mg into the skin once a week.   Yes [provider]  ferrous gluconate (FERGON) 324 MG tablet Take 1 tablet (324 mg total) by mouth daily. 04/23/23  Yes Deeann Saint, MD  glipiZIDE (GLUCOTROL XL) 10 MG 24 hr tablet TAKE 1 TABLET BY MOUTH TWICE A DAY 09/28/23  Yes Deeann Saint, MD  Multiple Vitamin (MULTIVITAMIN) tablet Take 1 tablet by mouth daily.   Yes [provider]  naproxen sodium (ALEVE) 220 MG tablet Take 220 mg by mouth daily as needed.   Yes [provider]  benzonatate (TESSALON) 100 MG capsule Take 1 capsule (100 mg total) by mouth 2 (two) times daily as needed for cough. Patient not taking: Reported on 10/13/2023 08/26/23   Deeann Saint, MD  blood glucose meter kit and supplies KIT Dispense based on patient and insurance preference. Use up to four times daily as directed. 06/05/21   Deeann Saint, MD  Lancets [facility] ULTRASOFT) lancets Use as instructed 07/07/22   Deeann Saint, MD         Laretta Bolster, M.D.  Internal Medicine Resident, PGY-1 [facility]  Pager: [phone] 6:34 AM, 10/15/2023   **Please contact the on call pager after 5 pm and on weekends at [phone].**

## 2023-10-15 NOTE — Plan of Care (Signed)
  Problem: Fluid Volume: Goal: Ability to maintain a balanced intake and output will improve Outcome: Progressing   Problem: Health Behavior/Discharge Planning: Goal: Ability to manage health-related needs will improve Outcome: Progressing   Problem: Nutritional: Goal: Progress toward achieving an optimal weight will improve Outcome: Progressing   Problem: Activity: Goal: Ability to tolerate increased activity will improve Outcome: Progressing   Problem: Clinical Measurements: Goal: Will remain free from infection Outcome: Progressing   Problem: Activity: Goal: Risk for activity intolerance will decrease Outcome: Progressing   Problem: Coping: Goal: Level of anxiety will decrease Outcome: Progressing

## 2023-10-15 NOTE — Progress Notes (Signed)
   Patient Name: Florestine Avers Date of Encounter: 10/15/2023 [facility] Cardiologist: Peter Swaziland, MD   Interval Summary  .    Remains intubated today  MAP 60-70s on NE gtt  Trop remains elevated > 24,000  Plans for extubation today.  Vital Signs .    Vitals:   10/15/23 0800 10/15/23 0815 10/15/23 0830 10/15/23 0845  BP: (!) 113/91     Pulse: 80 (!) 59 63 66  Resp: (!) 28 (!) 28 (!) 28 (!) 28  Temp: 99 F (37.2 C) 99.1 F (37.3 C) 99.1 F (37.3 C) 99.1 F (37.3 C)  TempSrc: Bladder     SpO2: 100% 100% 100% 100%  Weight:      Height:        Intake/Output Summary (Last 24 hours) at 10/15/2023 0905 Last data filed at 10/15/2023 0845 Gross per 24 hour  Intake 1158.89 ml  Output 1045 ml  Net 113.89 ml      10/13/2023    8:15 AM 08/26/2023    1:28 PM 07/06/2023    8:18 AM  Last 3 Weights  Weight (lbs) 147 lb 11.3 oz 149 lb 3.2 oz 148 lb 3.2 oz  Weight (kg) 67 kg 67.677 kg 67.223 kg      Telemetry/ECG    SR without VT - Personally Reviewed  Physical Exam .   GEN: Intubated, sedated Neck: No JVD Cardiac:  RRR, no murmurs, rubs, or gallops.  Respiratory: Clear to auscultation bilaterally. GI: Soft, nontender, non-distended  MS: No edema, warm  Assessment & Plan .      Cardiac arrest:  PEA arrest (likely due to hypoxia >> COPD/flu) >> CPR >> PTX >> chest tube.  Plans to possibly extubate today. ACS:  On heparin gtt; likely Type II MI given ongoing critical illness.  OK to stop hep gtt.  Cont ASA 81mg , start atorvastatin 80mg  for now.  Will need ischemic evaluation (cath) once extubated depending on neurologic function.  TTE with preserved EF, no valve issues. HTN:  Will optimize regimen after exubation HLD:  Start atorvasatin, LFTs only mildly elevated Pneumothorax:  Due to CPR; mgmt per primary team AKI:  Mildly elevated Cr.   CRITICAL CARE Performed by: Orbie Pyo   Total critical care time: 30 minutes  Critical care time was  exclusive of separately billable procedures and treating other patients.  Critical care was necessary to treat or prevent imminent or life-threatening deterioration.  Critical care was time spent personally by me on the following activities: development of treatment plan with patient and/or surrogate as well as nursing, discussions with consultants, evaluation of patient's response to treatment, examination of patient, obtaining history from patient or surrogate, ordering and performing treatments and interventions, ordering and review of laboratory studies, ordering and review of radiographic studies, pulse oximetry and re-evaluation of patient's condition.   For questions or updates, please contact [facility] Please consult [email or web address] for contact info under        Signed, Orbie Pyo, MD

## 2023-10-16 ENCOUNTER — Other Ambulatory Visit (HOSPITAL_COMMUNITY): Payer: Self-pay

## 2023-10-16 DIAGNOSIS — J9601 Acute respiratory failure with hypoxia: Secondary | ICD-10-CM | POA: Diagnosis not present

## 2023-10-16 DIAGNOSIS — E440 Moderate protein-calorie malnutrition: Secondary | ICD-10-CM | POA: Insufficient documentation

## 2023-10-16 LAB — BASIC METABOLIC PANEL
Anion gap: 9 (ref 5–15)
BUN: 26 mg/dL — ABNORMAL HIGH (ref 8–23)
CO2: 23 mmol/L (ref 22–32)
Calcium: 8.4 mg/dL — ABNORMAL LOW (ref 8.9–10.3)
Chloride: 108 mmol/L (ref 98–111)
Creatinine, Ser: 1.16 mg/dL — ABNORMAL HIGH (ref 0.44–1.00)
GFR, Estimated: 49 mL/min — ABNORMAL LOW (ref >60)
Glucose, Bld: 254 mg/dL — ABNORMAL HIGH (ref 70–99)
Potassium: 4.4 mmol/L (ref 3.5–5.1)
Sodium: 140 mmol/L (ref 135–145)

## 2023-10-16 LAB — CBC
HCT: 30.4 % — ABNORMAL LOW (ref 36.0–46.0)
Hemoglobin: 9.9 g/dL — ABNORMAL LOW (ref 12.0–15.0)
MCH: 34.7 pg — ABNORMAL HIGH (ref 26.0–34.0)
MCHC: 32.6 g/dL (ref 30.0–36.0)
MCV: 106.7 fL — ABNORMAL HIGH (ref 80.0–100.0)
Platelets: 125 10*3/uL — ABNORMAL LOW (ref 150–400)
RBC: 2.85 MIL/uL — ABNORMAL LOW (ref 3.87–5.11)
RDW: 14.7 % (ref 11.5–15.5)
WBC: 11.6 10*3/uL — ABNORMAL HIGH (ref 4.0–10.5)
nRBC: 0 % (ref 0.0–0.2)

## 2023-10-16 LAB — LEGIONELLA PNEUMOPHILA SEROGP 1 UR AG: L. pneumophila Serogp 1 Ur Ag: NEGATIVE

## 2023-10-16 LAB — GLUCOSE, CAPILLARY
Glucose-Capillary: 246 mg/dL — ABNORMAL HIGH (ref 70–99)
Glucose-Capillary: 193 mg/dL — ABNORMAL HIGH (ref 70–99)
Glucose-Capillary: 275 mg/dL — ABNORMAL HIGH (ref 70–99)
Glucose-Capillary: 126 mg/dL — ABNORMAL HIGH (ref 70–99)
Glucose-Capillary: 152 mg/dL — ABNORMAL HIGH (ref 70–99)
Glucose-Capillary: 158 mg/dL — ABNORMAL HIGH (ref 70–99)

## 2023-10-16 MED ORDER — INSULIN ASPART 100 UNIT/ML IJ SOLN
2.0000 [IU] | INTRAMUSCULAR | Status: DC
  Administered 2023-10-16 – 2023-10-17 (×4): 2 [IU] via SUBCUTANEOUS

## 2023-10-16 MED ORDER — ORAL CARE MOUTH RINSE: 15.0000 mL | OROMUCOSAL | Status: DC | PRN

## 2023-10-16 MED ORDER — POLYETHYLENE GLYCOL 3350 17 G PO PACK: 17.0000 g | PACK | Freq: Every day | ORAL | Status: DC

## 2023-10-16 MED ORDER — DOCUSATE SODIUM 50 MG/5ML PO LIQD
100.0000 mg | Freq: Two times a day (BID) | ORAL | Status: DC
  Administered 2023-10-16: 100 mg
  Filled 2023-10-16: qty 10

## 2023-10-16 MED ORDER — SENNOSIDES-DOCUSATE SODIUM 8.6-50 MG PO TABS: 1.0000 | ORAL_TABLET | Freq: Two times a day (BID) | ORAL | Status: DC

## 2023-10-16 MED ORDER — POLYETHYLENE GLYCOL 3350 17 G PO PACK
17.0000 g | PACK | Freq: Two times a day (BID) | ORAL | Status: DC
Start: 2023-10-16 — End: 2023-10-16

## 2023-10-16 MED ORDER — FENTANYL BOLUS VIA INFUSION: 25.0000 ug | INTRAVENOUS | Status: DC | PRN

## 2023-10-16 NOTE — Procedures (Signed)
 Extubation Procedure Note  Patient Details:   Name: Anita Mccormick DOB: 08/17/1945 MRN: [id number]   Airway Documentation:    Vent end date: 10/16/23 Vent end time: 1210   Evaluation  O2 sats: stable throughout Complications: No apparent complications Patient did tolerate procedure well. Bilateral Breath Sounds: Clear, Diminished   Yes Pt extubated to [place] 3L, pt tolerating well at this time. Cuff leak present, no stridor noted, and pt was able to speak clearly.   Idelle Leech 10/16/2023, 12:16 PM

## 2023-10-16 NOTE — Progress Notes (Signed)
   Patient Name: Anita Mccormick Date of Encounter: 10/16/2023 [facility] Cardiologist: Peter Swaziland, MD   Interval Summary  .    Failed extubation yesterday  No acute events overnight  Weaning sedation now with hopes to extubate today.  Vital Signs .    Vitals:   10/16/23 1030 10/16/23 1045 10/16/23 1100 10/16/23 1115  BP:   (!) 147/121   Pulse: (!) 118 (!) 116 (!) 125 (!) 124  Resp: 20 18 (!) 21 (!) 23  Temp: 98.6 F (37 C) 99 F (37.2 C) 99.1 F (37.3 C) 99.1 F (37.3 C)  TempSrc:      SpO2: 100% 100% 100% 100%  Weight:      Height:        Intake/Output Summary (Last 24 hours) at 10/16/2023 1137 Last data filed at 10/16/2023 1000 Gross per 24 hour  Intake 1848.64 ml  Output 593 ml  Net 1255.64 ml      10/13/2023    8:15 AM 08/26/2023    1:28 PM 07/06/2023    8:18 AM  Last 3 Weights  Weight (lbs) 147 lb 11.3 oz 149 lb 3.2 oz 148 lb 3.2 oz  Weight (kg) 67 kg 67.677 kg 67.223 kg      Telemetry/ECG    SR without VT - Personally Reviewed  Physical Exam .   GEN: Awake, responosive, ETT in place Neck: No JVD Cardiac:  RRR, no murmurs, rubs, or gallops.  Respiratory: Clear to auscultation bilaterally. GI: Soft, nontender, non-distended  MS: No edema, warm  Assessment & Plan .      Cardiac arrest:  PEA arrest (likely due to hypoxia >> COPD/flu) >> CPR >> PTX >> chest tube.  Plans to possibly extubate today. ACS:  On heparin gtt; likely Type II MI given ongoing critical illness.  Cont ASA 81mg  and atorvastatin 80mg .  TTE with preserved EF.  Given greatly elevated troponins, will need ischemic evaluation (likely cath) depending on neuro status. HTN:  Will optimize regimen after exubation HLD:  Continue atorvasatin Pneumothorax:  Due to CPR; mgmt per primary team AKI:  Cr stable.   CRITICAL CARE Performed by: Orbie Pyo   Total critical care time: 30 minutes  Critical care time was exclusive of separately billable procedures and treating  other patients.  Critical care was necessary to treat or prevent imminent or life-threatening deterioration.  Critical care was time spent personally by me on the following activities: development of treatment plan with patient and/or surrogate as well as nursing, discussions with consultants, evaluation of patient's response to treatment, examination of patient, obtaining history from patient or surrogate, ordering and performing treatments and interventions, ordering and review of laboratory studies, ordering and review of radiographic studies, pulse oximetry and re-evaluation of patient's condition.   For questions or updates, please contact [facility] Please consult [email or web address] for contact info under        Signed, Orbie Pyo, MD

## 2023-10-16 NOTE — Plan of Care (Signed)
  Problem: Fluid Volume: Goal: Ability to maintain a balanced intake and output will improve Outcome: Progressing   Problem: Health Behavior/Discharge Planning: Goal: Ability to manage health-related needs will improve Outcome: Progressing   Problem: Nutritional: Goal: Maintenance of adequate nutrition will improve Outcome: Progressing   Problem: Tissue Perfusion: Goal: Adequacy of tissue perfusion will improve Outcome: Progressing   Problem: Respiratory: Goal: Ability to maintain a clear airway and adequate ventilation will improve Outcome: Progressing

## 2023-10-16 NOTE — Plan of Care (Signed)
   Problem: Education: Goal: Ability to describe self-care measures that may prevent or decrease complications (Diabetes Survival Skills Education) will improve Outcome: Progressing Goal: Individualized Educational Video(s) Outcome: Progressing   Problem: Coping: Goal: Ability to adjust to condition or change in health will improve Outcome: Progressing   Problem: Fluid Volume: Goal: Ability to maintain a balanced intake and output will improve Outcome: Progressing   Problem: Health Behavior/Discharge Planning: Goal: Ability to identify and utilize available resources and services will improve Outcome: Progressing Goal: Ability to manage health-related needs will improve Outcome: Progressing   Problem: Metabolic: Goal: Ability to maintain appropriate glucose levels will improve Outcome: Progressing   Problem: Nutritional: Goal: Maintenance of adequate nutrition will improve Outcome: Progressing Goal: Progress toward achieving an optimal weight will improve Outcome: Progressing   Problem: Skin Integrity: Goal: Risk for impaired skin integrity will decrease Outcome: Progressing   Problem: Tissue Perfusion: Goal: Adequacy of tissue perfusion will improve Outcome: Progressing   Problem: Activity: Goal: Ability to tolerate increased activity will improve Outcome: Progressing   Problem: Respiratory: Goal: Ability to maintain a clear airway and adequate ventilation will improve Outcome: Progressing   Problem: Role Relationship: Goal: Method of communication will improve Outcome: Progressing   Problem: Education: Goal: Knowledge of General Education information will improve Description: Including pain rating scale, medication(s)/side effects and non-pharmacologic comfort measures Outcome: Progressing   Problem: Health Behavior/Discharge Planning: Goal: Ability to manage health-related needs will improve Outcome: Progressing   Problem: Clinical Measurements: Goal:  Ability to maintain clinical measurements within normal limits will improve Outcome: Progressing Goal: Will remain free from infection Outcome: Progressing Goal: Diagnostic test results will improve Outcome: Progressing Goal: Respiratory complications will improve Outcome: Progressing Goal: Cardiovascular complication will be avoided Outcome: Progressing   Problem: Activity: Goal: Risk for activity intolerance will decrease Outcome: Progressing   Problem: Nutrition: Goal: Adequate nutrition will be maintained Outcome: Progressing   Problem: Coping: Goal: Level of anxiety will decrease Outcome: Progressing   Problem: Elimination: Goal: Will not experience complications related to bowel motility Outcome: Progressing Goal: Will not experience complications related to urinary retention Outcome: Progressing   Problem: Pain Managment: Goal: General experience of comfort will improve and/or be controlled Outcome: Progressing   Problem: Safety: Goal: Ability to remain free from injury will improve Outcome: Progressing   Problem: Skin Integrity: Goal: Risk for impaired skin integrity will decrease Outcome: Progressing

## 2023-10-16 NOTE — Progress Notes (Signed)
 NAME:  Anita Mccormick, MRN:  [id number], DOB:  November 20, 1945, LOS: 3 ADMISSION DATE:  10/13/2023, CONSULTATION DATE:  10/13/23 REFERRING MD:  Dr. Alinda Money, CHIEF COMPLAINT:  SOB   History of Present Illness:  Pt encephalopathic, therefore HPI obtained from EMR.   4 yoF w/ PMH of tobacco abuse, HTN, HLD, CVA, DM, CKD2, HFpEF, anemia, breast cancer, NAFLD/ cirrhosis, and COVID 08/2023 presenting with worsening SOB, orthopnea and wheezing.  Residual cough and SOB since COVID.    On workup, found to be flu A positive, with initial 2L [place] requirement, and elevated Bps.  CXR neg.  Admitted to [facility] but had syncopal event going to the bathroom and reported she occasionally has them.  Found to be more hypoxic in the 80's. Later while awaiting bed placement, found to be in PEA bradycardic > asystolic arrest with ROSC after 3 rounds of ACLS measures and bicarb.  Post ROSC, jaw clenched, opening eyes but otherwise non responsive therefore intubated for airway protection.  IO placed due to IV infiltration during RSI.  PCCM called for further medical management.    Pertinent  Medical History  Tobacco abuse, HTN, HLD, CVA, DM, CKD2, HFpEF, anemia, breast cancer, NASH cirrhosis COVID 08/2023  Significant Hospital Events: Including procedures, antibiotic start and stop dates in addition to other pertinent events   3/18 admit to [facility] Flu PNA. Syncope. Coded. Tube after. Admit ICU   Interim History / Subjective:  NAEOV  Still on 1 mcg of NorEpi, unable to wean off due to hypotension.   Able to follow some commands, stick tongue out and wiggle toes. Objective   Blood pressure 119/64, pulse 66, temperature 97.9 F (36.6 C), resp. rate (!) 28, height 5\' 2"  (1.575 m), weight 67 kg, SpO2 99%.  CVP:  [2 mmHg-11 mmHg] 11 mmHg  Vent Mode: PRVC FiO2 (%):  [40 %] 40 % Set Rate:  [28 bmp] 28 bmp Vt Set:  [400 mL] 400 mL PEEP:  [5 cmH20] 5 cmH20 Plateau Pressure:  [16 cmH20-18 cmH20] 18 cmH20   Fentayl 50 mcg Norepi 1  mcg Propofol 20???  Intake/Output Summary (Last 24 hours) at 10/16/2023 0727 Last data filed at 10/16/2023 0700 Gross per 24 hour  Intake 1705.47 ml  Output 633 ml  Net 1072.47 ml   Net ** + 1.07L Filed Weights   10/13/23 0815  Weight: 67 kg   Labs.  CBC  WBC 15.8 >>11.6 Hb 10 SCR 1.22 >>1.16 Blood glucose 254  Examination: General: critically and chronically ill elderly F intubated  HENT: ETT secure  Lungs: Poor air movement. Bilat wheezes. Mechanically ventilated  Cardiovascular: tachycardic  Abdomen: soft ndnt  Extremities: warm Neuro: alert, able to follow commands.  Resolved Hospital Problem list    Assessment & Plan:   PEA cardiac arrest with ROSC after 3 rounds CPR Elevated Troponin Type II MI  Syncope Cardiac arrest secondary to hypoxia vs. COPD/flu. Troponin remains elevated, likely in the setting of ongoing critical illness.   Echo showed LVEF of 60 to 65%, no valvular issues. Will need ischemic evaluation (cath) once extubated depending on neurologic function.   -Cardiology following, appreciate recs. -ASA 81 mg -Atorvastatin 80 mg   # Acute hypoxic and hypercarbic respiratory failure # Flu A. COPD Exacerbation  Respiratory failure secondary to flu, and pneumonia.  Failed SBT yesterday due to hypoventilation.  On 1 mcg of the Norepinephrine, failed SBT yesterday due to hypoventilation.  Unable to wean off epi due to hypotension.  Will wean  off propofol to allow weaning off of norepinephrine.  - SBT  - triple therapy + cont PRN duonebs - #3/5  ceftriaxone, 3/3 azithromycin -  Tamiflu for 10 days ( #4/10) - Solu-Medrol 40 mg.  Left pneumothorax Will keep chest tube until after extubation. -Chest tube to suction.  HTN HLD HFpEF -Holding  antihypertensive.  DMT2 Fasting BG 252, secondary to steroids.  No history of diabetes.  She is on tube feeds.  - CBG q4 w/ SSI with tube feeds.  Best Practice (right click and "Reselect all SmartList  Selections" daily)  Diet/type: Tube feeds. DVT prophylaxis LMWH Pressure ulcer(s): pressure ulcer assessment deferred  GI prophylaxis: PPI Lines: RLE IO Foley:  Yes, and it is still needed Code Status:  full code Last date of multidisciplinary goals of care discussion [--] Husband updated post arrest by hospitalist, remains full code/full scope of offered care  Labs   CBC: Recent Labs  Lab 10/13/23 0944 10/13/23 1815 10/14/23 0106 10/14/23 0442 10/14/23 1942 10/15/23 0420 10/16/23 0345  WBC 6.9  --   --  7.8  --  15.8* 11.6*  NEUTROABS 5.8  --   --   --   --   --   --   HGB 12.2   < > 10.9* 10.2* 10.5* 10.2* 9.9*  HCT 37.6   < > 32.0* 30.3* 31.0* 30.4* 30.4*  MCV 106.8*  --   --  105.6*  --  104.1* 106.7*  PLT 138*  --   --  92*  --  129* 125*   < > = values in this interval not displayed.    Basic Metabolic Panel: Recent Labs  Lab 10/13/23 0944 10/13/23 1815 10/13/23 1845 10/13/23 1911 10/14/23 0106 10/14/23 0442 10/14/23 1942 10/15/23 0420 10/15/23 0830 10/16/23 0345  NA 137   < > 142   < > 140 138 140 138  --  140  K 4.0   < > 3.9   < > 3.4* 4.3 4.8 4.6  --  4.4  CL 105  --  101  --   --  105  --  106  --  108  CO2 24  --  21*  --   --  26  --  24  --  23  GLUCOSE 219*  --  306*  --   --  241*  --  183*  --  254*  BUN 9  --  11  --   --  13  --  20  --  26*  CREATININE 0.87  --  1.34*  --   --  1.19*  --  1.22*  --  1.16*  CALCIUM 8.8*  --  8.7*  --   --  8.7*  --  8.6*  --  8.4*  MG  --   --  2.1  --   --  1.5*  --   --  2.7*  --   PHOS  --   --   --   --   --  3.0  --   --   --   --    < > = values in this interval not displayed.   GFR: Estimated Creatinine Clearance: 37.1 mL/min (A) (by C-G formula based on SCr of 1.16 mg/dL (H)). Recent Labs  Lab 10/13/23 0944 10/13/23 1816 10/13/23 2022 10/13/23 2135 10/14/23 0442 10/14/23 1105 10/14/23 1400 10/15/23 0420 10/15/23 1122 10/16/23 0345  PROCALCITON  --   --   --   --   --   --   --   --  1.35   --   WBC 6.9  --   --   --  7.8  --   --  15.8*  --  11.6*  LATICACIDVEN  --    < > 6.3* 6.1*  --  3.2* 2.2*  --   --   --    < > = values in this interval not displayed.    Liver Function Tests: Recent Labs  Lab 10/13/23 1845 10/14/23 0442  AST 58* 146*  ALT 27 44  ALKPHOS 69 49  BILITOT 0.9 0.7  PROT 6.6 5.4*  ALBUMIN 3.4* 2.6*   No results for input(s): "LIPASE", "AMYLASE" in the last 168 hours. No results for input(s): "AMMONIA" in the last 168 hours.  ABG    Component Value Date/Time   PHART 7.376 10/14/2023 1942   PCO2ART 45.8 10/14/2023 1942   PO2ART 109 (H) 10/14/2023 1942   HCO3 27.0 10/14/2023 1942   TCO2 28 10/14/2023 1942   ACIDBASEDEF 1.0 10/14/2023 0106   O2SAT 98 10/14/2023 1942     Coagulation Profile: No results for input(s): "INR", "PROTIME" in the last 168 hours.  Cardiac Enzymes: No results for input(s): "CKTOTAL", "CKMB", "CKMBINDEX", "TROPONINI" in the last 168 hours.  HbA1C: Hgb A1c MFr Bld  Date/Time Value Ref Range Status  10/13/2023 06:53 PM 5.0 4.8 - 5.6 % Final    Comment:    (NOTE) Pre diabetes:          5.7%-6.4%  Diabetes:              >6.4%  Glycemic control for   <7.0% adults with diabetes   04/23/2023 12:33 PM 6.4 4.6 - 6.5 % Final    Comment:    Glycemic Control Guidelines for People with Diabetes:Non Diabetic:  <6%Goal of Therapy: <7%Additional Action Suggested:  >8%     CBG: Recent Labs  Lab 10/15/23 1121 10/15/23 1524 10/15/23 1935 10/15/23 2325 10/16/23 0337  GLUCAP 178* 192* 246* 253* 246*    Review of Systems:   Unable to obtain, intubatred   Past Medical History:  CVA, type 2 diabetes, hypertension, iron deficiency anemia Surgical History:   Past Surgical History:  Procedure Laterality Date   ARTERY BIOPSY Left 11/29/2021   Procedure: LEFT BIOPSY TEMPORAL ARTERY;  Surgeon: Chuck Hint, MD;  Location: [facility] OR;  Service: Vascular;  Laterality: Left;   CATARACT EXTRACTION Bilateral     MASTECTOMY Left    TUBAL LIGATION       Social History:   reports that she has been smoking cigarettes. She has a 2 pack-year smoking history. She has never used smokeless tobacco. She reports that she does not currently use alcohol. She reports that she does not use drugs.   Family History:  Her family history includes Cancer in her father; Diabetes in her sister; Pancreatic cancer in her sister; Stroke in her mother.   Allergies Allergies  Allergen Reactions   Bee Venom Swelling   Penicillins Hives   Ace Inhibitors Cough    ????   Chlorthalidone     REACTION: unspecified   Metformin     REACTION: gi side effects   Sulfamethoxazole     REACTION: questionable     Home Medications  Prior to Admission medications   Medication Sig Start Date End Date Taking? Authorizing Provider  aspirin EC 81 MG tablet Take 81 mg by mouth daily. Swallow whole.   Yes [provider]  atenolol (TENORMIN) 100 MG tablet TAKE 1 TABLET BY  MOUTH TWICE A DAY 04/27/23  Yes Deeann Saint, MD  Continuous Glucose Sensor (DEXCOM G7 SENSOR) MISC Apply a new sensor to skin every 10 days. 04/23/23  Yes Deeann Saint, MD  Dulaglutide (TRULICITY) 1.5 MG/0.5ML SOAJ Inject 1.5 mg into the skin once a week.   Yes [provider]  ferrous gluconate (FERGON) 324 MG tablet Take 1 tablet (324 mg total) by mouth daily. 04/23/23  Yes Deeann Saint, MD  glipiZIDE (GLUCOTROL XL) 10 MG 24 hr tablet TAKE 1 TABLET BY MOUTH TWICE A DAY 09/28/23  Yes Deeann Saint, MD  Multiple Vitamin (MULTIVITAMIN) tablet Take 1 tablet by mouth daily.   Yes [provider]  naproxen sodium (ALEVE) 220 MG tablet Take 220 mg by mouth daily as needed.   Yes [provider]  benzonatate (TESSALON) 100 MG capsule Take 1 capsule (100 mg total) by mouth 2 (two) times daily as needed for cough. Patient not taking: Reported on 10/13/2023 08/26/23   Deeann Saint, MD  blood glucose meter kit and supplies KIT  Dispense based on patient and insurance preference. Use up to four times daily as directed. 06/05/21   Deeann Saint, MD  Lancets [facility] ULTRASOFT) lancets Use as instructed 07/07/22   Deeann Saint, MD         Laretta Bolster, M.D.  Internal Medicine Resident, PGY-1 [facility]  Pager: [phone] 7:27 AM, 10/16/2023   **Please contact the on call pager after 5 pm and on weekends at [phone].**

## 2023-10-17 ENCOUNTER — Inpatient Hospital Stay (HOSPITAL_COMMUNITY)

## 2023-10-17 DIAGNOSIS — I469 Cardiac arrest, cause unspecified: Secondary | ICD-10-CM | POA: Diagnosis not present

## 2023-10-17 DIAGNOSIS — J9602 Acute respiratory failure with hypercapnia: Secondary | ICD-10-CM | POA: Diagnosis not present

## 2023-10-17 DIAGNOSIS — J9601 Acute respiratory failure with hypoxia: Secondary | ICD-10-CM | POA: Diagnosis not present

## 2023-10-17 DIAGNOSIS — J09X1 Influenza due to identified novel influenza A virus with pneumonia: Secondary | ICD-10-CM

## 2023-10-17 LAB — CBC
HCT: 30.2 % — ABNORMAL LOW (ref 36.0–46.0)
Hemoglobin: 9.8 g/dL — ABNORMAL LOW (ref 12.0–15.0)
MCH: 35.8 pg — ABNORMAL HIGH (ref 26.0–34.0)
MCHC: 32.5 g/dL (ref 30.0–36.0)
MCV: 110.2 fL — ABNORMAL HIGH (ref 80.0–100.0)
Platelets: 84 10*3/uL — ABNORMAL LOW (ref 150–400)
RBC: 2.74 MIL/uL — ABNORMAL LOW (ref 3.87–5.11)
RDW: 14.8 % (ref 11.5–15.5)
WBC: 9.4 10*3/uL (ref 4.0–10.5)
nRBC: 0.2 % (ref 0.0–0.2)

## 2023-10-17 LAB — FOLATE: Folate: 16.3 ng/mL (ref >5.9)

## 2023-10-17 LAB — VITAMIN B12: Vitamin B-12: 1043 pg/mL — ABNORMAL HIGH (ref 180–914)

## 2023-10-17 LAB — FERRITIN: Ferritin: 203 ng/mL (ref 11–307)

## 2023-10-17 LAB — GLUCOSE, CAPILLARY
Glucose-Capillary: 140 mg/dL — ABNORMAL HIGH (ref 70–99)
Glucose-Capillary: 157 mg/dL — ABNORMAL HIGH (ref 70–99)
Glucose-Capillary: 306 mg/dL — ABNORMAL HIGH (ref 70–99)
Glucose-Capillary: 141 mg/dL — ABNORMAL HIGH (ref 70–99)
Glucose-Capillary: 96 mg/dL (ref 70–99)

## 2023-10-17 MED ORDER — AMLODIPINE BESYLATE 5 MG PO TABS
5.0000 mg | ORAL_TABLET | Freq: Every day | ORAL | Status: DC
  Administered 2023-10-17 – 2023-11-06 (×19): 5 mg via ORAL
  Filled 2023-10-17 (×21): qty 1

## 2023-10-17 MED ORDER — INSULIN ASPART 100 UNIT/ML IJ SOLN
0.0000 [IU] | Freq: Every day | INTRAMUSCULAR | Status: DC
  Administered 2023-10-24 – 2023-10-26 (×2): 2 [IU] via SUBCUTANEOUS
  Administered 2023-10-27: 5 [IU] via SUBCUTANEOUS
  Administered 2023-10-31: 2 [IU] via SUBCUTANEOUS
  Administered 2023-11-02: 3 [IU] via SUBCUTANEOUS
  Administered 2023-11-04 – 2023-11-07 (×3): 2 [IU] via SUBCUTANEOUS

## 2023-10-17 MED ORDER — ATORVASTATIN CALCIUM 80 MG PO TABS
80.0000 mg | ORAL_TABLET | Freq: Every day | ORAL | Status: DC
  Administered 2023-10-17 – 2023-11-11 (×26): 80 mg via ORAL
  Filled 2023-10-17 (×14): qty 1
  Filled 2023-10-17: qty 2
  Filled 2023-10-17 (×11): qty 1

## 2023-10-17 MED ORDER — ACETAMINOPHEN 325 MG PO TABS
650.0000 mg | ORAL_TABLET | Freq: Four times a day (QID) | ORAL | Status: DC | PRN
  Administered 2023-10-17 – 2023-11-03 (×17): 650 mg via ORAL
  Filled 2023-10-17 (×18): qty 2

## 2023-10-17 MED ORDER — PREDNISONE 20 MG PO TABS
40.0000 mg | ORAL_TABLET | Freq: Every day | ORAL | Status: AC
  Administered 2023-10-18: 40 mg via ORAL
  Filled 2023-10-17: qty 2

## 2023-10-17 MED ORDER — ASPIRIN 81 MG PO TBEC
81.0000 mg | DELAYED_RELEASE_TABLET | Freq: Every day | ORAL | Status: DC
  Administered 2023-10-17 – 2023-11-11 (×26): 81 mg via ORAL
  Filled 2023-10-17 (×26): qty 1

## 2023-10-17 MED ORDER — ACETAMINOPHEN 650 MG RE SUPP: 650.0000 mg | Freq: Four times a day (QID) | RECTAL | Status: DC | PRN

## 2023-10-17 MED ORDER — INSULIN ASPART 100 UNIT/ML IJ SOLN
0.0000 [IU] | Freq: Three times a day (TID) | INTRAMUSCULAR | Status: DC
  Administered 2023-10-17: 2 [IU] via SUBCUTANEOUS
  Administered 2023-10-17: 11 [IU] via SUBCUTANEOUS
  Administered 2023-10-18 (×3): 5 [IU] via SUBCUTANEOUS
  Administered 2023-10-19: 3 [IU] via SUBCUTANEOUS
  Administered 2023-10-19 – 2023-10-20 (×2): 8 [IU] via SUBCUTANEOUS
  Administered 2023-10-21 (×2): 2 [IU] via SUBCUTANEOUS
  Administered 2023-10-21 – 2023-10-22 (×2): 3 [IU] via SUBCUTANEOUS
  Administered 2023-10-22: 2 [IU] via SUBCUTANEOUS
  Administered 2023-10-22 – 2023-10-23 (×2): 3 [IU] via SUBCUTANEOUS
  Administered 2023-10-23: 2 [IU] via SUBCUTANEOUS
  Administered 2023-10-23: 5 [IU] via SUBCUTANEOUS
  Administered 2023-10-24: 8 [IU] via SUBCUTANEOUS
  Administered 2023-10-24 – 2023-10-25 (×3): 3 [IU] via SUBCUTANEOUS
  Administered 2023-10-25 – 2023-10-26 (×2): 5 [IU] via SUBCUTANEOUS
  Administered 2023-10-26 (×2): 3 [IU] via SUBCUTANEOUS
  Administered 2023-10-27: 8 [IU] via SUBCUTANEOUS
  Administered 2023-10-27 – 2023-10-28 (×3): 5 [IU] via SUBCUTANEOUS
  Administered 2023-10-28: 3 [IU] via SUBCUTANEOUS
  Administered 2023-10-28: 11 [IU] via SUBCUTANEOUS
  Administered 2023-10-29: 8 [IU] via SUBCUTANEOUS
  Administered 2023-10-29: 5 [IU] via SUBCUTANEOUS
  Administered 2023-10-29: 8 [IU] via SUBCUTANEOUS
  Administered 2023-10-30: 5 [IU] via SUBCUTANEOUS
  Administered 2023-10-30: 8 [IU] via SUBCUTANEOUS
  Administered 2023-10-30: 3 [IU] via SUBCUTANEOUS
  Administered 2023-10-31: 8 [IU] via SUBCUTANEOUS
  Administered 2023-10-31: 3 [IU] via SUBCUTANEOUS
  Administered 2023-10-31: 8 [IU] via SUBCUTANEOUS
  Administered 2023-11-01: 5 [IU] via SUBCUTANEOUS
  Administered 2023-11-01 (×2): 11 [IU] via SUBCUTANEOUS
  Administered 2023-11-02: 3 [IU] via SUBCUTANEOUS
  Administered 2023-11-02: 15 [IU] via SUBCUTANEOUS
  Administered 2023-11-02 – 2023-11-03 (×3): 5 [IU] via SUBCUTANEOUS
  Administered 2023-11-03 – 2023-11-04 (×2): 11 [IU] via SUBCUTANEOUS
  Administered 2023-11-04: 3 [IU] via SUBCUTANEOUS
  Administered 2023-11-05: 5 [IU] via SUBCUTANEOUS
  Administered 2023-11-05: 3 [IU] via SUBCUTANEOUS
  Administered 2023-11-05: 11 [IU] via SUBCUTANEOUS
  Administered 2023-11-06: 5 [IU] via SUBCUTANEOUS
  Administered 2023-11-06: 8 [IU] via SUBCUTANEOUS
  Administered 2023-11-06 – 2023-11-07 (×2): 3 [IU] via SUBCUTANEOUS
  Administered 2023-11-07: 11 [IU] via SUBCUTANEOUS
  Administered 2023-11-08 (×2): 5 [IU] via SUBCUTANEOUS
  Administered 2023-11-08 – 2023-11-09 (×3): 3 [IU] via SUBCUTANEOUS
  Administered 2023-11-10 (×2): 2 [IU] via SUBCUTANEOUS
  Administered 2023-11-11: 3 [IU] via SUBCUTANEOUS
  Administered 2023-11-11: 8 [IU] via SUBCUTANEOUS

## 2023-10-17 MED ORDER — WHITE PETROLATUM EX OINT: TOPICAL_OINTMENT | CUTANEOUS | Status: DC | PRN

## 2023-10-17 MED ORDER — GUAIFENESIN-DM 100-10 MG/5ML PO SYRP
5.0000 mL | ORAL_SOLUTION | ORAL | Status: DC | PRN
  Administered 2023-10-17 (×2): 5 mL via ORAL
  Filled 2023-10-17 (×2): qty 10
  Filled 2023-10-17: qty 5

## 2023-10-17 MED ORDER — OSELTAMIVIR PHOSPHATE 30 MG PO CAPS
30.0000 mg | ORAL_CAPSULE | Freq: Two times a day (BID) | ORAL | Status: AC
  Administered 2023-10-17 – 2023-10-18 (×4): 30 mg via ORAL
  Filled 2023-10-17 (×5): qty 1

## 2023-10-17 NOTE — Progress Notes (Signed)
 NAME:  Anita Mccormick, MRN:  [id number], DOB:  1945/08/04, LOS: 4 ADMISSION DATE:  10/13/2023, CONSULTATION DATE:  10/13/23 REFERRING MD:  Dr. Alinda Money, CHIEF COMPLAINT:  SOB   History of Present Illness:  Pt encephalopathic, therefore HPI obtained from EMR.   33 yoF w/ PMH of tobacco abuse, HTN, HLD, CVA, DM, CKD2, HFpEF, anemia, breast cancer, NAFLD/ cirrhosis, and COVID 08/2023 presenting with worsening SOB, orthopnea and wheezing.  Residual cough and SOB since COVID.    On workup, found to be flu A positive, with initial 2L [place] requirement, and elevated Bps.  CXR neg.  Admitted to [facility] but had syncopal event going to the bathroom and reported she occasionally has them.  Found to be more hypoxic in the 80's. Later while awaiting bed placement, found to be in PEA bradycardic > asystolic arrest with ROSC after 3 rounds of ACLS measures and bicarb.  Post ROSC, jaw clenched, opening eyes but otherwise non responsive therefore intubated for airway protection.  IO placed due to IV infiltration during RSI.  PCCM called for further medical management.    Pertinent  Medical History  Tobacco abuse, HTN, HLD, CVA, DM, CKD2, HFpEF, anemia, breast cancer, NASH cirrhosis COVID 08/2023  Significant Hospital Events: Including procedures, antibiotic start and stop dates in addition to other pertinent events   3/18 admit to [facility] Flu PNA. Syncope. Coded. Tube after. Admit ICU  2023-10-29 passed SBT and extubated.  Interim History / Subjective:   Successfully extubated yesterday.  Off pressors and now hypertensive.  Agitated delirium overnight.  Calmer this morning.  Objective   Blood pressure (!) 149/116, pulse (!) 113, temperature 98.9 F (37.2 C), temperature source Oral, resp. rate 20, height 5\' 2"  (1.575 m), weight 67 kg, SpO2 96%.  CVP:  [4 mmHg-5 mmHg] 5 mmHg  FiO2 (%):  [28 %] 28 %    Intake/Output Summary (Last 24 hours) at 2023/10/29 1148 Last data filed at October 29, 2023 0931 Gross per 24 hour  Intake 1047  ml  Output 975 ml  Net 72 ml   Net ** + 1.07L Filed Weights   10/13/23 0815  Weight: 67 kg    Examination: General: Frail-appearing elderly woman sitting in bed in no distress. HENT: No pressure ulcers.  Mucous membranes intact. Lungs: Coughing occasionally.  Lungs clear bilaterally on 3 L nasal cannula Cardiovascular: Normal heart sounds.  No JVD no edema. Abdomen: Soft and nontender. Extremities: No active joints. Neuro: alert, able to follow commands.  Speech is soft.  No focal deficits.  Ancillary test personally reviewed:  Chest x-ray this morning shows clear lung fields and no residual pneumothorax. CT head showed chronic microvascular changes. Creatinine has improved to 1.16. Mild macrocytic anemia 9.8. Assessment & Plan:   Acute hypoxic hypercarbic respiratory failure secondary to influenza A pneumonia. PEA cardiac arrest with ROSC after 3 rounds CPR Type II myocardial injury secondary to above Agitated delirium with sundowning. Possible cognitive decline at home. Left pneumothorax. History of dyslipidemia History of hypertension History of HFpEF.  Plan:   -Progressive ambulation.  Wean O2 to keep saturation greater than 92%. -Remove left chest tube. -No signs of decompensated-heart failure, no diuresis at this time. -Cardiology following.  Plan is to risk stratify prior to discharge. -Continue secondary prevention-ASA, atorvastatin. -Too early to know if current delirium is indication of long-term cognitive decline.  Reassured daughter that some improvement may yet occur.  Formal cognitive evaluation closer to time of discharge. -Switched antihypertensive from atenolol which may have  cognitive effects to amlodipine.   -Monitor heart rate.  If remains high restart beta-blocker.  Have stopped arformoterol. -Complete 5 days of Tamiflu, ceftriaxone and prednisone.  Latter may be also contributing to delirium. -Home iron supplement discontinued.  Ferritin B12 and  folate ordered. -Ready for transfer to floor.  Best Practice (right click and "Reselect all SmartList Selections" daily)  Diet/type: Regular diet DVT prophylaxis LMWH Pressure ulcer(s): None GI prophylaxis: PPI Lines: RLE IO Foley:  Yes, and it is still needed Code Status:  full code Last date of multidisciplinary goals of care discussion [daughter updated at bedside.]  Lynnell Catalan, MD [facility] ICU Physician [facility]  Pager: [phone] Or Epic Secure Chat After hours: [phone].  10/17/2023, 11:58 AM

## 2023-10-17 NOTE — Progress Notes (Signed)
   Rounding Note    Patient Name: Anita Mccormick Date of Encounter: 10/17/2023   [facility] Cardiologist: Peter Swaziland, MD   Subjective   Extubated. Family at bedside.   Vital Signs    Vitals:   10/17/23 0712 10/17/23 0757 10/17/23 0927 10/17/23 0943  BP: (!) 157/70  (!) 179/68 (!) 179/68  Pulse: (!) 109     Resp: (!) 25     Temp:  98.9 F (37.2 C)    TempSrc:  Oral    SpO2: 96%     Weight:      Height:        Intake/Output Summary (Last 24 hours) at 10/17/2023 0944 Last data filed at 10/17/2023 0931 Gross per 24 hour  Intake 1247.21 ml  Output 1035 ml  Net 212.21 ml      10/13/2023    8:15 AM 08/26/2023    1:28 PM 07/06/2023    8:18 AM  Last 3 Weights  Weight (lbs) 147 lb 11.3 oz 149 lb 3.2 oz 148 lb 3.2 oz  Weight (kg) 67 kg 67.677 kg 67.223 kg      Telemetry    Personally Reviewed  ECG    Personally Reviewed  Physical Exam   GEN: No acute distress.  Elderly. Cardiac: RRR, no murmurs, rubs, or gallops.  Respiratory: Clear to auscultation bilaterally. Psych: Normal affect   Assessment & Plan    #Cardiac arrest #PEA arrest Her arrest was secondary to respiratory (Flu/COPD > Hypoxia) Now extubated  #Pneumothorax Chest tube still in place  #NSTEMI No chest pain this AM. Troponin significantly elevated. Hemodynamically stable now. Plan for LHC prior to discharge.  #HTN Improving.     Sheria Lang T. Lalla Brothers, MD, [facility], [facility] Cardiac Electrophysiology

## 2023-10-18 DIAGNOSIS — J9601 Acute respiratory failure with hypoxia: Secondary | ICD-10-CM | POA: Diagnosis not present

## 2023-10-18 DIAGNOSIS — I469 Cardiac arrest, cause unspecified: Secondary | ICD-10-CM | POA: Diagnosis not present

## 2023-10-18 LAB — CBC WITH DIFFERENTIAL/PLATELET
Abs Immature Granulocytes: 0.09 10*3/uL — ABNORMAL HIGH (ref 0.00–0.07)
Basophils Absolute: 0 10*3/uL (ref 0.0–0.1)
Basophils Relative: 0 %
Eosinophils Absolute: 0 10*3/uL (ref 0.0–0.5)
Eosinophils Relative: 0 %
HCT: 28.6 % — ABNORMAL LOW (ref 36.0–46.0)
Hemoglobin: 9.3 g/dL — ABNORMAL LOW (ref 12.0–15.0)
Immature Granulocytes: 1 %
Lymphocytes Relative: 4 %
Lymphs Abs: 0.3 10*3/uL — ABNORMAL LOW (ref 0.7–4.0)
MCH: 35 pg — ABNORMAL HIGH (ref 26.0–34.0)
MCHC: 32.5 g/dL (ref 30.0–36.0)
MCV: 107.5 fL — ABNORMAL HIGH (ref 80.0–100.0)
Monocytes Absolute: 0.7 10*3/uL (ref 0.1–1.0)
Monocytes Relative: 9 %
Neutro Abs: 6.7 10*3/uL (ref 1.7–7.7)
Neutrophils Relative %: 86 %
Platelets: 76 10*3/uL — ABNORMAL LOW (ref 150–400)
RBC: 2.66 MIL/uL — ABNORMAL LOW (ref 3.87–5.11)
RDW: 14.4 % (ref 11.5–15.5)
Smear Review: DECREASED
WBC: 7.8 10*3/uL (ref 4.0–10.5)
nRBC: 0 % (ref 0.0–0.2)

## 2023-10-18 LAB — GLUCOSE, CAPILLARY
Glucose-Capillary: 227 mg/dL — ABNORMAL HIGH (ref 70–99)
Glucose-Capillary: 245 mg/dL — ABNORMAL HIGH (ref 70–99)
Glucose-Capillary: 206 mg/dL — ABNORMAL HIGH (ref 70–99)
Glucose-Capillary: 176 mg/dL — ABNORMAL HIGH (ref 70–99)

## 2023-10-18 LAB — BASIC METABOLIC PANEL
Anion gap: 12 (ref 5–15)
BUN: 15 mg/dL (ref 8–23)
CO2: 21 mmol/L — ABNORMAL LOW (ref 22–32)
Calcium: 8.9 mg/dL (ref 8.9–10.3)
Chloride: 108 mmol/L (ref 98–111)
Creatinine, Ser: 1.19 mg/dL — ABNORMAL HIGH (ref 0.44–1.00)
GFR, Estimated: 47 mL/min — ABNORMAL LOW (ref >60)
Glucose, Bld: 229 mg/dL — ABNORMAL HIGH (ref 70–99)
Potassium: 4.7 mmol/L (ref 3.5–5.1)
Sodium: 141 mmol/L (ref 135–145)

## 2023-10-18 LAB — IRON AND TIBC
Iron: 21 ug/dL — ABNORMAL LOW (ref 28–170)
Saturation Ratios: 8 % — ABNORMAL LOW (ref 10.4–31.8)
TIBC: 270 ug/dL (ref 250–450)
UIBC: 249 ug/dL

## 2023-10-18 MED ORDER — IRON SUCROSE 300 MG IVPB - SIMPLE MED
300.0000 mg | Status: DC
  Filled 2023-10-18: qty 265

## 2023-10-18 MED ORDER — DM-GUAIFENESIN ER 30-600 MG PO TB12
1.0000 | ORAL_TABLET | Freq: Two times a day (BID) | ORAL | Status: AC
  Administered 2023-10-18 – 2023-10-21 (×7): 1 via ORAL
  Filled 2023-10-18 (×7): qty 1

## 2023-10-18 NOTE — Progress Notes (Signed)
 Progress Note   Patient: Anita Mccormick ZOX:[id number] DOB: 1946-07-05 DOA: 10/13/2023     5 DOS: the patient was seen and examined on 10/18/2023   Brief hospital course: From [facility]  88 yoF w/ PMH of tobacco abuse, HTN, HLD, CVA, DM, CKD2, HFpEF, anemia, breast cancer, NAFLD/ cirrhosis, and COVID 08/2023 presenting with worsening SOB, orthopnea and wheezing.  Residual cough and SOB since COVID.     On workup, found to be flu A positive, with initial 2L [place] requirement, and elevated Bps.  CXR neg.  Admitted to [facility] but had syncopal event going to the bathroom and reported she occasionally has them.  Found to be more hypoxic in the 80's. Later while awaiting bed placement, found to be in PEA bradycardic > asystolic arrest with ROSC after 3 rounds of ACLS measures and bicarb.  Post ROSC, jaw clenched, opening eyes but otherwise non responsive therefore intubated for airway protection.  IO placed due to IV infiltration during resuscitation.  3/18 PM patient became hypotensive en route to ICU. She was fluid responsive but required vasopressors. She was found to have worsening L ptx which was first noted after intubation, a pigtail was placed.   3/19 In shock, requiring vasopressors. NE and vasopressin. COOX 70% making cardiogenic shock not likely. Possibly sepsis. On Ceftraixone/azthromycin for CAP. Hep gtt for NSTEMI.   3/20 L/RHC after resolution of respiratory issues resolve  3/21  Extubated. On IV antibiotics and tamiflu. Remained on NE which was weaned after extubated.   3/22 Stable for transfer to floor. Chest tube removed.    Assessment and Plan: #PEA arrest #Cardiac arrest  IN the setting of acute hypoxemic respiratory failure due to influenza A infection. No PE. Plan for LHC prior to discharge.   #Pneumothorax Chest tube removed.   #NSTEMI  Asymptomatic this AM. HDS. Plan for [facility] per cardiology   #HTN Elevated blood pressures. Currently on amlodipine. Gradually make normotensive.    #Syncope Patient does not recall these episodes at bedside. But admission documentation states that these episodes would happen after she would use the bathroom and were likely vasovagal in nature. Her TTE had no evidence to suggest etiology of her syncopal episodes. Telemetry with no abnormalities.   #Thrombocytopenia  Down to 76K.  Unclear etiology. Folate and B12 WNL. Possibly in setting of critical illness.  No obvious bleeding. Will continue to monitor.   Macrocytic anemia  B12 and folate WNL. TSAT 8%.  Will replete iron   Acute Respiratory failure Resolved  Influenza A   H/o CVA  No deficits noted. On ASA and statin.   HLD Statin  DM2 A1c 5. This hospitalization. Elevated blood glucoses in the setting of steroids. Will hold on further titration of insulin for now. Had mostly normal blood glucoses day prior.   Chronic diastolic CHF  Appears euvolemic. Hold diuresis.   Cirrhosis  NAFLD Appears compensated.   H/o Breast cancer  Noted      Subjective: Feels breathing is improved.   Physical Exam: Vitals:   10/18/23 0523 10/18/23 0846 10/18/23 0851 10/18/23 1235  BP: (!) 146/59  (!) 152/58 (!) 153/74  Pulse: (!) 110 (!) 116 (!) 112 (!) 109  Resp: 16 16 17 17   Temp: 100.1 F (37.8 C)  98.2 F (36.8 C) 99.5 F (37.5 C)  TempSrc: Oral  Oral Oral  SpO2: 97% 96% 94% 100%  Weight:      Height:       Physical Exam  Constitutional:  In no distress. Ill appearing  Cardiovascular: Normal rate, regular rhythm. No lower extremity edema  Pulmonary: Non labored breathing on room air, no wheezing or rales.   Abdominal: Soft. Normal bowel sounds. Non distended and non tender Musculoskeletal: Normal range of motion.     Neurological: Alert and oriented to person, place, and time. Non focal  Skin: Skin is warm and dry.   Data Reviewed:  CBC    Component Value Date/Time   WBC 7.8 10/18/2023 0834   RBC 2.66 (L) 10/18/2023 0834   HGB 9.3 (L) 10/18/2023 0834   HCT  28.6 (L) 10/18/2023 0834   HCT 36.3 08/07/2021 1235   PLT 76 (L) 10/18/2023 0834   MCV 107.5 (H) 10/18/2023 0834   MCH 35.0 (H) 10/18/2023 0834   MCHC 32.5 10/18/2023 0834   RDW 14.4 10/18/2023 0834   LYMPHSABS 0.3 (L) 10/18/2023 0834   MONOABS 0.7 10/18/2023 0834   EOSABS 0.0 10/18/2023 0834   BASOSABS 0.0 10/18/2023 0834      Latest Ref Rng & Units 10/18/2023    8:34 AM 10/16/2023    3:45 AM 10/15/2023    4:20 AM  BMP  Glucose 70 - 99 mg/dL 409  811  914   BUN 8 - 23 mg/dL 15  26  20    Creatinine 0.44 - 1.00 mg/dL 7.82  9.56  2.13   Sodium 135 - 145 mmol/L 141  140  138   Potassium 3.5 - 5.1 mmol/L 4.7  4.4  4.6   Chloride 98 - 111 mmol/L 108  108  106   CO2 22 - 32 mmol/L 21  23  24    Calcium 8.9 - 10.3 mg/dL 8.9  8.4  8.6      Family Communication: None at bedside.   Disposition: Status is: Inpatient Remains inpatient appropriate because: Further management   Planned Discharge Destination:  Pending clinical course     Time spent: 35 minutes  Author: Marolyn Haller, MD 10/18/2023 2:22 PM  For on call review [email or web address].

## 2023-10-18 NOTE — Plan of Care (Signed)
  Problem: Education: Goal: Ability to describe self-care measures that may prevent or decrease complications (Diabetes Survival Skills Education) will improve Outcome: Not Progressing Goal: Individualized Educational Video(s) Outcome: Not Progressing   Problem: Coping: Goal: Ability to adjust to condition or change in health will improve Outcome: Not Progressing   Problem: Fluid Volume: Goal: Ability to maintain a balanced intake and output will improve Outcome: Not Progressing   Problem: Health Behavior/Discharge Planning: Goal: Ability to identify and utilize available resources and services will improve Outcome: Not Progressing Goal: Ability to manage health-related needs will improve Outcome: Not Progressing   Problem: Metabolic: Goal: Ability to maintain appropriate glucose levels will improve Outcome: Not Progressing   Problem: Nutritional: Goal: Maintenance of adequate nutrition will improve Outcome: Not Progressing Goal: Progress toward achieving an optimal weight will improve Outcome: Not Progressing   Problem: Skin Integrity: Goal: Risk for impaired skin integrity will decrease Outcome: Not Progressing   Problem: Tissue Perfusion: Goal: Adequacy of tissue perfusion will improve Outcome: Not Progressing   Problem: Activity: Goal: Ability to tolerate increased activity will improve Outcome: Not Progressing   Problem: Respiratory: Goal: Ability to maintain a clear airway and adequate ventilation will improve Outcome: Not Progressing   Problem: Role Relationship: Goal: Method of communication will improve Outcome: Not Progressing   Problem: Education: Goal: Knowledge of General Education information will improve Description: Including pain rating scale, medication(s)/side effects and non-pharmacologic comfort measures Outcome: Not Progressing   Problem: Health Behavior/Discharge Planning: Goal: Ability to manage health-related needs will  improve Outcome: Not Progressing   Problem: Clinical Measurements: Goal: Ability to maintain clinical measurements within normal limits will improve Outcome: Not Progressing Goal: Will remain free from infection Outcome: Not Progressing Goal: Diagnostic test results will improve Outcome: Not Progressing Goal: Respiratory complications will improve Outcome: Not Progressing Goal: Cardiovascular complication will be avoided Outcome: Not Progressing   Problem: Activity: Goal: Risk for activity intolerance will decrease Outcome: Not Progressing   Problem: Nutrition: Goal: Adequate nutrition will be maintained Outcome: Not Progressing   Problem: Coping: Goal: Level of anxiety will decrease Outcome: Not Progressing   Problem: Elimination: Goal: Will not experience complications related to bowel motility Outcome: Not Progressing Goal: Will not experience complications related to urinary retention Outcome: Not Progressing   Problem: Pain Managment: Goal: General experience of comfort will improve and/or be controlled Outcome: Not Progressing   Problem: Safety: Goal: Ability to remain free from injury will improve Outcome: Not Progressing   Problem: Skin Integrity: Goal: Risk for impaired skin integrity will decrease Outcome: Not Progressing

## 2023-10-18 NOTE — Evaluation (Signed)
 Occupational Therapy Evaluation Patient Details Name: Anita Mccormick MRN: [id number] DOB: 1946-01-20 Today's Date: 10/18/2023   History of Present Illness   Pt is a  78 y/o F presenting to ED on 3/18 with worsening SOB/orthopnea, found to be Flu A +, admitted for acute hypoxic respiratory failure with hypoxia. Had syncopal episode in ED while awaiting bed, found to be PEA bradycardic  > asystolic arrest with 3 rounds of ACLS measures, intubated 3/18-3/21. PMH includes tobacco abuse, HTN, HLD, CVA, DM, CKD III, HFpEF, anemia, breast CA     Clinical Impressions Pt reports ind at baseline with ADLs/functional mobility, lives with family who can assist at d/c. Pt currently needing min-mod A for ADLs, mod A for bed mobility and minA for 2-3 steps to chair at sink. Pt able to participate in seated/standing bathing/grooming tasks at sink. Pt also with decr cognition, thinks she is at the hospital for a stroke. Pt presenting with impairments listed below, will follow acutely. Patient will benefit from intensive inpatient follow-up therapy, >3 hours/day to maximize safety/ind with ADL/functional mobility.      If plan is discharge home, recommend the following:   A lot of help with walking and/or transfers;A lot of help with bathing/dressing/bathroom;Assistance with cooking/housework;Direct supervision/assist for medications management;Direct supervision/assist for financial management;Help with stairs or ramp for entrance;Assist for transportation     Functional Status Assessment   Patient has had a recent decline in their functional status and demonstrates the ability to make significant improvements in function in a reasonable and predictable amount of time.     Equipment Recommendations   BSC/3in1;Other (comment) (RW)     Recommendations for Other Services   PT consult;Rehab consult     Precautions/Restrictions   Precautions Precautions: Fall Restrictions Weight Bearing  Restrictions Per Provider Order: No     Mobility Bed Mobility Overal bed mobility: Needs Assistance Bed Mobility: Supine to Sit     Supine to sit: Mod assist     General bed mobility comments: mod A trunk elevation    Transfers Overall transfer level: Needs assistance Equipment used: Rolling walker (2 wheels) Transfers: Sit to/from Stand Sit to Stand: Min assist                  Balance Overall balance assessment: Needs assistance Sitting-balance support: Feet supported Sitting balance-Leahy Scale: Fair     Standing balance support: Reliant on assistive device for balance, During functional activity Standing balance-Leahy Scale: Poor Standing balance comment: reliant on RW support                           ADL either performed or assessed with clinical judgement   ADL Overall ADL's : Needs assistance/impaired Eating/Feeding: Minimal assistance   Grooming: Minimal assistance   Upper Body Bathing: Moderate assistance   Lower Body Bathing: Moderate assistance   Upper Body Dressing : Moderate assistance   Lower Body Dressing: Moderate assistance   Toilet Transfer: Minimal assistance;Rolling walker (2 wheels)   Toileting- Clothing Manipulation and Hygiene: Minimal assistance       Functional mobility during ADLs: Minimal assistance       Vision Baseline Vision/History: 1 Wears glasses Additional Comments: functional for BADL, pt reports poor vision since last stroke     Perception Perception: Not tested       Praxis Praxis: Not tested       Pertinent Vitals/Pain Pain Assessment Pain Assessment: No/denies pain     Extremity/Trunk  Assessment Upper Extremity Assessment Upper Extremity Assessment: Generalized weakness   Lower Extremity Assessment Lower Extremity Assessment: Defer to PT evaluation   Cervical / Trunk Assessment Cervical / Trunk Assessment: Normal   Communication Communication Communication: Other  (comment) Factors Affecting Communication: Other (comment);Reduced clarity of speech (soft spoken)   Cognition Arousal: Alert Behavior During Therapy: Flat affect Cognition: No family/caregiver present to determine baseline, Cognition impaired   Orientation impairments: Situation (states she had a stroke) Awareness: Intellectual awareness impaired, Online awareness impaired Memory impairment (select all impairments): Declarative long-term memory, Working memory, Short-term memory Attention impairment (select first level of impairment): Divided attention, Alternating attention Executive functioning impairment (select all impairments): Problem solving OT - Cognition Comments: pt                 Following commands: Impaired (incr time)       Cueing  General Comments   Cueing Techniques: Verbal cues  VSS on RA   Exercises     Shoulder Instructions      Home Living Family/patient expects to be discharged to:: Private residence Living Arrangements: Spouse/significant other Available Help at Discharge: Family;Available PRN/intermittently Type of Home: House Home Access: Stairs to enter [employer] of Steps: 2   Home Layout: One level     Bathroom Shower/Tub: Chief Strategy Officer: Standard     Home Equipment: Cane - single point;Shower seat          Prior Functioning/Environment Prior Level of Function : Needs assist             Mobility Comments: uses cane/RW PRN ADLs Comments: reports ind    OT Problem List: Decreased strength;Decreased range of motion;Decreased activity tolerance;Impaired balance (sitting and/or standing);Decreased cognition;Decreased safety awareness;Impaired vision/perception   OT Treatment/Interventions: Self-care/ADL training;Therapeutic exercise;Energy conservation;DME and/or AE instruction;Therapeutic activities;Patient/family education;Balance training;Cognitive remediation/compensation;Visual/perceptual  remediation/compensation      OT Goals(Current goals can be found in the care plan section)   Acute Rehab OT Goals Patient Stated Goal: none stated OT Goal Formulation: With patient Time For Goal Achievement: 11/01/23 Potential to Achieve Goals: Good   OT Frequency:  Min 2X/week    Co-evaluation              AM-PAC OT "6 Clicks" Daily Activity     Outcome Measure Help from another person eating meals?: A Little Help from another person taking care of personal grooming?: A Little Help from another person toileting, which includes using toliet, bedpan, or urinal?: A Lot Help from another person bathing (including washing, rinsing, drying)?: A Lot Help from another person to put on and taking off regular upper body clothing?: A Lot Help from another person to put on and taking off regular lower body clothing?: A Lot 6 Click Score: 14   End of Session Equipment Utilized During Treatment: Gait belt;Rolling walker (2 wheels) Nurse Communication: Mobility status  Activity Tolerance: Patient tolerated treatment well Patient left: in chair;with call bell/phone within reach;with chair alarm set  OT Visit Diagnosis: Unsteadiness on feet (R26.81);Other abnormalities of gait and mobility (R26.89);Muscle weakness (generalized) (M62.81)                Time: 1610-9604 OT Time Calculation (min): 30 min Charges:  OT General Charges $OT Visit: 1 Visit OT Evaluation $OT Eval Moderate Complexity: 1 Mod OT Treatments $Self Care/Home Management : 8-22 mins  Carver Fila, OTD, OTR/L SecureChat Preferred Acute Rehab [phone]   Dalphine Handing 10/18/2023, 12:44 PM

## 2023-10-18 NOTE — Plan of Care (Signed)
   Problem: Education: Goal: Ability to describe self-care measures that may prevent or decrease complications (Diabetes Survival Skills Education) will improve Outcome: Progressing Goal: Individualized Educational Video(s) Outcome: Progressing   Problem: Coping: Goal: Ability to adjust to condition or change in health will improve Outcome: Progressing   Problem: Fluid Volume: Goal: Ability to maintain a balanced intake and output will improve Outcome: Progressing   Problem: Health Behavior/Discharge Planning: Goal: Ability to identify and utilize available resources and services will improve Outcome: Progressing Goal: Ability to manage health-related needs will improve Outcome: Progressing   Problem: Metabolic: Goal: Ability to maintain appropriate glucose levels will improve Outcome: Progressing   Problem: Nutritional: Goal: Maintenance of adequate nutrition will improve Outcome: Progressing Goal: Progress toward achieving an optimal weight will improve Outcome: Progressing   Problem: Skin Integrity: Goal: Risk for impaired skin integrity will decrease Outcome: Progressing   Problem: Tissue Perfusion: Goal: Adequacy of tissue perfusion will improve Outcome: Progressing   Problem: Activity: Goal: Ability to tolerate increased activity will improve Outcome: Progressing   Problem: Respiratory: Goal: Ability to maintain a clear airway and adequate ventilation will improve Outcome: Progressing   Problem: Role Relationship: Goal: Method of communication will improve Outcome: Progressing   Problem: Education: Goal: Knowledge of General Education information will improve Description: Including pain rating scale, medication(s)/side effects and non-pharmacologic comfort measures Outcome: Progressing   Problem: Health Behavior/Discharge Planning: Goal: Ability to manage health-related needs will improve Outcome: Progressing   Problem: Clinical Measurements: Goal:  Ability to maintain clinical measurements within normal limits will improve Outcome: Progressing Goal: Will remain free from infection Outcome: Progressing Goal: Diagnostic test results will improve Outcome: Progressing Goal: Respiratory complications will improve Outcome: Progressing Goal: Cardiovascular complication will be avoided Outcome: Progressing   Problem: Activity: Goal: Risk for activity intolerance will decrease Outcome: Progressing   Problem: Nutrition: Goal: Adequate nutrition will be maintained Outcome: Progressing   Problem: Coping: Goal: Level of anxiety will decrease Outcome: Progressing   Problem: Elimination: Goal: Will not experience complications related to bowel motility Outcome: Progressing Goal: Will not experience complications related to urinary retention Outcome: Progressing   Problem: Pain Managment: Goal: General experience of comfort will improve and/or be controlled Outcome: Progressing   Problem: Safety: Goal: Ability to remain free from injury will improve Outcome: Progressing   Problem: Skin Integrity: Goal: Risk for impaired skin integrity will decrease Outcome: Progressing

## 2023-10-18 NOTE — Progress Notes (Signed)
   Rounding Note    Patient Name: Anita Mccormick Date of Encounter: 10/18/2023  [facility] Cardiologist: Peter [place], MD   Subjective   Transferred to 2W. Feeling OK. Family at bedside.  Vital Signs    Vitals:   10/17/23 2009 10/18/23 0523 10/18/23 0846 10/18/23 0851  BP:  (!) 146/59  (!) 152/58  Pulse:  (!) 110 (!) 116 (!) 112  Resp:  16 16 17   Temp:  100.1 F (37.8 C)  98.2 F (36.8 C)  TempSrc:  Oral  Oral  SpO2: 100% 97% 96% 94%  Weight:      Height:        Intake/Output Summary (Last 24 hours) at 10/18/2023 1048 Last data filed at 10/18/2023 0500 Gross per 24 hour  Intake --  Output 1075 ml  Net -1075 ml      10/13/2023    8:15 AM 08/26/2023    1:28 PM 07/06/2023    8:18 AM  Last 3 Weights  Weight (lbs) 147 lb 11.3 oz 149 lb 3.2 oz 148 lb 3.2 oz  Weight (kg) 67 kg 67.677 kg 67.223 kg      Telemetry    Personally Reviewed  ECG    Personally Reviewed  Physical Exam   GEN: No acute distress.  Elderly. Cardiac: RRR, no murmurs, rubs, or gallops.  Respiratory: Clear to auscultation bilaterally. Psych: Normal affect   Assessment & Plan    #Cardiac arrest #PEA arrest Her arrest was secondary to respiratory (Flu/COPD > Hypoxia) Now extubated  #Pneumothorax Resolved. Chest tube removed.  #NSTEMI No chest pain this AM. Troponin significantly elevated. Hemodynamically stable now. Plan for LHC prior to discharge vs outpatient pending respiratory status.  #HTN Improving.     Sheria Lang T. Lalla Brothers, MD, [facility], [facility] Cardiac Electrophysiology

## 2023-10-18 NOTE — Evaluation (Signed)
 Physical Therapy Evaluation Patient Details Name: Anita Mccormick MRN: [id number] DOB: 1946/03/24 Today's Date: 10/18/2023  History of Present Illness  Pt is 78 yo presenting to [facility] ED on 3/18 due to worsening shortness of breathe, orthopnea and wheezing with residual cough since COVID. Pt had NSTEMI while in ED with plan for LHC prior to discharge. PMH: HTN, HLD, CVA, DM, CKD II, HFEF, anemia, breast cancer, NAFLD/cirrhosis and COVID 08/2023  Clinical Impression  Pt is presenting slightly below baseline level of functioning. Currently pt is Mod I for rolling using bed rails, supervision for supine<>sitting, Min-Supervision for sit to stand with RW and CGA for short distance gait. Pt lives with family who can assist intermittently. Due to pt current functional status, home set up and available assistance at home recommending skilled physical therapy services 3x/week in order to address strength, balance and functional mobility to decrease risk for falls, injury and re-hospitalization.           If plan is discharge home, recommend the following: A little help with walking and/or transfers;Assist for transportation;Help with stairs or ramp for entrance;Assistance with cooking/housework     Equipment Recommendations None recommended by PT     Functional Status Assessment Patient has had a recent decline in their functional status and demonstrates the ability to make significant improvements in function in a reasonable and predictable amount of time.     Precautions / Restrictions Precautions Precautions: Fall Restrictions Weight Bearing Restrictions Per Provider Order: No      Mobility  Bed Mobility Overal bed mobility: Needs Assistance Bed Mobility: Supine to Sit, Sit to Supine     Supine to sit: Supervision Sit to supine: Supervision        Transfers Overall transfer level: Needs assistance Equipment used: Rolling walker (2 wheels) Transfers: Sit to/from Stand Sit to Stand: Min  assist, Supervision           General transfer comment: MIn A for momentum to get to standing initially. Second attempt pt supervision    Ambulation/Gait Ambulation/Gait assistance: Contact guard assist Gait Distance (Feet): 20 Feet Assistive device: Rolling walker (2 wheels) Gait Pattern/deviations: Step-through pattern, Decreased stride length Gait velocity: decreased Gait velocity interpretation: <1.31 ft/sec, indicative of household ambulator   General Gait Details: no significant deviations      Balance Overall balance assessment: Mild deficits observed, not formally tested Sitting-balance support: Feet supported Sitting balance-Leahy Scale: Good     Standing balance support: No upper extremity supported, Single extremity supported, Bilateral upper extremity supported, During functional activity, Reliant on assistive device for balance Standing balance-Leahy Scale: Fair Standing balance comment: no overt LOB, pt does not require AD for balance in standing.         Pertinent Vitals/Pain Pain Assessment Pain Assessment: Faces Faces Pain Scale: Hurts a little bit Facial Expression: Grimacing Body Movements: Absence of movements Muscle Tension: Relaxed Compliance with ventilator (intubated pts.): N/A Vocalization (extubated pts.): Talking in normal tone or no sound CPOT Total: 2 Pain Location: pt reports aching all over Pain Descriptors / Indicators: Aching Pain Intervention(s): Monitored during session    Home Living Family/patient expects to be discharged to:: Private residence Living Arrangements: Spouse/significant other Available Help at Discharge: Family;Available PRN/intermittently Type of Home: House Home Access: Stairs to enter   Entrance Stairs-Number of Steps: 2   Home Layout: One level Home Equipment: Cane - single point;Shower seat      Prior Function Prior Level of Function : Needs assist  Mobility Comments: uses cane/RW  PRN ADLs Comments: reports ind     Extremity/Trunk Assessment   Upper Extremity Assessment Upper Extremity Assessment: Defer to OT evaluation    Lower Extremity Assessment Lower Extremity Assessment: Generalized weakness    Cervical / Trunk Assessment Cervical / Trunk Assessment: Normal  Communication   Communication Communication: No apparent difficulties Factors Affecting Communication: Other (comment);Reduced clarity of speech (soft spoken)    Cognition Arousal: Alert Behavior During Therapy: WFL for tasks assessed/performed   PT - Cognitive impairments: No apparent impairments       Following commands: Intact       Cueing Cueing Techniques: Verbal cues     General Comments General comments (skin integrity, edema, etc.): Pt demonstrates no signs/symptoms of cardiac/respiratory distress with activity        Assessment/Plan    PT Assessment Patient needs continued PT services  PT Problem List Decreased strength;Decreased mobility;Decreased activity tolerance       PT Treatment Interventions DME instruction;Therapeutic exercise;Gait training;Balance training;Stair training;Functional mobility training;Therapeutic activities;Patient/family education    PT Goals (Current goals can be found in the Care Plan section)  Acute Rehab PT Goals Patient Stated Goal: to return home with family PT Goal Formulation: With patient Time For Goal Achievement: 11/01/23 Potential to Achieve Goals: Good    Frequency Min 1X/week        AM-PAC PT "6 Clicks" Mobility  Outcome Measure Help needed turning from your back to your side while in a flat bed without using bedrails?: A Little Help needed moving from lying on your back to sitting on the side of a flat bed without using bedrails?: A Little Help needed moving to and from a bed to a chair (including a wheelchair)?: A Little Help needed standing up from a chair using your arms (e.g., wheelchair or bedside chair)?: A  Little Help needed to walk in hospital room?: A Little Help needed climbing 3-5 steps with a railing? : A Little 6 Click Score: 18    End of Session Equipment Utilized During Treatment: Gait belt Activity Tolerance: Patient tolerated treatment well Patient left: in bed;with call bell/phone within reach;with bed alarm set Nurse Communication: Mobility status PT Visit Diagnosis: Unsteadiness on feet (R26.81);Other abnormalities of gait and mobility (R26.89)    Time: 1345-1411 PT Time Calculation (min) (ACUTE ONLY): 26 min   Charges:   PT Evaluation $PT Eval Low Complexity: 1 Low PT Treatments $Therapeutic Activity: 8-22 mins PT General Charges $$ ACUTE PT VISIT: 1 Visit        Harrel Carina, DPT, CLT  Acute Rehabilitation Services Office: [phone] (Secure chat preferred)   Claudia Desanctis 10/18/2023, 2:19 PM

## 2023-10-19 ENCOUNTER — Ambulatory Visit: Admitting: Family Medicine

## 2023-10-19 DIAGNOSIS — J9601 Acute respiratory failure with hypoxia: Secondary | ICD-10-CM | POA: Diagnosis not present

## 2023-10-19 DIAGNOSIS — I469 Cardiac arrest, cause unspecified: Secondary | ICD-10-CM | POA: Diagnosis not present

## 2023-10-19 LAB — CBC WITH DIFFERENTIAL/PLATELET
Abs Immature Granulocytes: 0.15 10*3/uL — ABNORMAL HIGH (ref 0.00–0.07)
Basophils Absolute: 0 10*3/uL (ref 0.0–0.1)
Basophils Relative: 0 %
Eosinophils Absolute: 0 10*3/uL (ref 0.0–0.5)
Eosinophils Relative: 0 %
HCT: 34.4 % — ABNORMAL LOW (ref 36.0–46.0)
Hemoglobin: 11 g/dL — ABNORMAL LOW (ref 12.0–15.0)
Immature Granulocytes: 2 %
Lymphocytes Relative: 5 %
Lymphs Abs: 0.4 10*3/uL — ABNORMAL LOW (ref 0.7–4.0)
MCH: 34.7 pg — ABNORMAL HIGH (ref 26.0–34.0)
MCHC: 32 g/dL (ref 30.0–36.0)
MCV: 108.5 fL — ABNORMAL HIGH (ref 80.0–100.0)
Monocytes Absolute: 0.8 10*3/uL (ref 0.1–1.0)
Monocytes Relative: 9 %
Neutro Abs: 6.9 10*3/uL (ref 1.7–7.7)
Neutrophils Relative %: 84 %
Platelets: 106 10*3/uL — ABNORMAL LOW (ref 150–400)
RBC: 3.17 MIL/uL — ABNORMAL LOW (ref 3.87–5.11)
RDW: 13.8 % (ref 11.5–15.5)
WBC: 8.3 10*3/uL (ref 4.0–10.5)
nRBC: 0 % (ref 0.0–0.2)

## 2023-10-19 LAB — GLUCOSE, CAPILLARY
Glucose-Capillary: 167 mg/dL — ABNORMAL HIGH (ref 70–99)
Glucose-Capillary: 255 mg/dL — ABNORMAL HIGH (ref 70–99)
Glucose-Capillary: 114 mg/dL — ABNORMAL HIGH (ref 70–99)
Glucose-Capillary: 148 mg/dL — ABNORMAL HIGH (ref 70–99)

## 2023-10-19 LAB — BASIC METABOLIC PANEL
Anion gap: 10 (ref 5–15)
BUN: 21 mg/dL (ref 8–23)
CO2: 22 mmol/L (ref 22–32)
Calcium: 9.3 mg/dL (ref 8.9–10.3)
Chloride: 109 mmol/L (ref 98–111)
Creatinine, Ser: 0.94 mg/dL (ref 0.44–1.00)
GFR, Estimated: >60 mL/min (ref >60)
Glucose, Bld: 186 mg/dL — ABNORMAL HIGH (ref 70–99)
Potassium: 4.6 mmol/L (ref 3.5–5.1)
Sodium: 141 mmol/L (ref 135–145)

## 2023-10-19 MED ORDER — SODIUM CHLORIDE 0.9 % IV SOLN
300.0000 mg | INTRAVENOUS | Status: AC
  Administered 2023-10-20 – 2023-10-21 (×2): 300 mg via INTRAVENOUS
  Filled 2023-10-19 (×3): qty 15

## 2023-10-19 MED ORDER — ADULT MULTIVITAMIN W/MINERALS CH
1.0000 | ORAL_TABLET | Freq: Every day | ORAL | Status: DC
  Administered 2023-10-19 – 2023-11-11 (×24): 1 via ORAL
  Filled 2023-10-19 (×23): qty 1

## 2023-10-19 MED ORDER — ENSURE ENLIVE PO LIQD
237.0000 mL | Freq: Two times a day (BID) | ORAL | Status: DC
  Administered 2023-10-20: 237 mL via ORAL

## 2023-10-19 NOTE — Progress Notes (Signed)
 Progress Note   Patient: Anita Mccormick ZOX:[id number] DOB: Jul 07, 1946 DOA: 10/13/2023     6 DOS: the patient was seen and examined on 10/19/2023   Brief hospital course: From [facility]  50 yoF w/ PMH of tobacco abuse, HTN, HLD, CVA, DM, CKD2, HFpEF, anemia, breast cancer, NAFLD/ cirrhosis, and COVID 08/2023 presenting with worsening SOB, orthopnea and wheezing.  Residual cough and SOB since COVID.     On workup, found to be flu A positive, with initial 2L [place] requirement, and elevated Bps.  CXR neg.  Admitted to [facility] but had syncopal event going to the bathroom and reported she occasionally has them.  Found to be more hypoxic in the 80's. Later while awaiting bed placement, found to be in PEA bradycardic > asystolic arrest with ROSC after 3 rounds of ACLS measures and bicarb.  Post ROSC, jaw clenched, opening eyes but otherwise non responsive therefore intubated for airway protection.  IO placed due to IV infiltration during resuscitation.  3/18 PM patient became hypotensive en route to ICU. She was fluid responsive but required vasopressors. She was found to have worsening L ptx which was first noted after intubation, a pigtail was placed.   3/19 In shock, requiring vasopressors. NE and vasopressin. COOX 70% making cardiogenic shock not likely. Possibly sepsis. On Ceftraixone/azthromycin for CAP. Hep gtt for NSTEMI.   3/20 L/RHC after resolution of respiratory issues resolve  3/21  Extubated. On IV antibiotics and tamiflu. Remained on NE which was weaned after extubated.   3/22 Stable for transfer to floor. Chest tube removed.    Assessment and Plan: #PEA arrest #Cardiac arrest  - In the setting of acute hypoxemic respiratory failure due to influenza A infection. No PE.  - Notably elevated troponins.  Cardiology following. NSTEMI dx as below.    #Pneumothorax Chest tube removed.  - Breathing well today.  She was receiving a nebulized breathing treatment on my exam. -On O2 -Most recent chest  x-ray read as "no discernible pneumothorax."  #NSTEMI  Asymptomatic this AM. HDS.  - Plan for [facility] per cardiology most likely tomorrow 3/25.  N.p.o. after midnight. -Still has chest musculoskeletal soreness from CPR administered for PEA above.  #HTN Elevated blood pressures. Currently on amlodipine. Gradually make normotensive.  -Currently not on any beta-blocker.  Defer to cardiology.  #Syncope Patient does not recall these episodes at bedside. But admission documentation states that these episodes would happen after she would use the bathroom and were likely vasovagal in nature. Her TTE had no evidence to suggest etiology of her syncopal episodes. Telemetry with no abnormalities.  Continue telemetry for now.  #Thrombocytopenia  Down to 76K, now trending back up to 106.  Unclear etiology. Folate and B12 WNL. Possibly in setting of critical illness.  No obvious bleeding. Will continue to monitor.   Macrocytic anemia  B12 and folate WNL. TSAT 8%.  She was written for iron previously.  There has been some discrepancy in chart as to whether or not she should receive this.  Will currently hold on iron until after left heart cath scheduled for 3/25.  Acute Respiratory failure Resolved  Influenza A   H/o CVA  No deficits noted. On ASA and statin.   HLD Statin  DM2 A1c 5 this hospitalization.  - Elevated blood glucoses in the setting of steroids. Will hold on further titration of insulin for now.  - Follow CBG  Chronic diastolic CHF  Appears euvolemic. Hold diuresis. Appreciate cardiology input  Cirrhosis  NAFLD Appears compensated.   H/o Breast cancer  Noted      Subjective: Feels breathing is improved. Still with some residual chest soreness  Physical Exam: Vitals:   10/18/23 1553 10/18/23 2012 10/18/23 2102 10/19/23 0829  BP: (!) 164/61  (!) 166/70 (!) 159/75  Pulse: (!) 101  100 85  Resp: 18  16 17   Temp: 98 F (36.7 C)  99.4 F (37.4 C)   TempSrc:   Oral    SpO2:  (P) 98% 94% 97%  Weight:      Height:       Physical Exam  Constitutional: In no distress. Ill appearing.  Mask in place on my exam Cardiovascular: Normal rate, regular rhythm. No lower extremity edema  Pulmonary: Non labored breathing on room air, no wheezing or rales.   Abdominal: Soft. Normal bowel sounds. Non distended and non tender Musculoskeletal: Normal range of motion.     Neurological: Alert and oriented to person, place, and time. Non focal  Skin: Skin is warm and dry.   Data Reviewed:  CBC    Component Value Date/Time   WBC 8.3 10/19/2023 1043   RBC 3.17 (L) 10/19/2023 1043   HGB 11.0 (L) 10/19/2023 1043   HCT 34.4 (L) 10/19/2023 1043   HCT 36.3 08/07/2021 1235   PLT 106 (L) 10/19/2023 1043   MCV 108.5 (H) 10/19/2023 1043   MCH 34.7 (H) 10/19/2023 1043   MCHC 32.0 10/19/2023 1043   RDW 13.8 10/19/2023 1043   LYMPHSABS 0.4 (L) 10/19/2023 1043   MONOABS 0.8 10/19/2023 1043   EOSABS 0.0 10/19/2023 1043   BASOSABS 0.0 10/19/2023 1043      Latest Ref Rng & Units 10/19/2023   10:43 AM 10/18/2023    8:34 AM 10/16/2023    3:45 AM  BMP  Glucose 70 - 99 mg/dL 161  096  045   BUN 8 - 23 mg/dL 21  15  26    Creatinine 0.44 - 1.00 mg/dL 4.09  8.11  9.14   Sodium 135 - 145 mmol/L 141  141  140   Potassium 3.5 - 5.1 mmol/L 4.6  4.7  4.4   Chloride 98 - 111 mmol/L 109  108  108   CO2 22 - 32 mmol/L 22  21  23    Calcium 8.9 - 10.3 mg/dL 9.3  8.9  8.4      Family Communication: Daughter at bedside today.  Answered all questions.  She is obviously overwhelmed and I recommended she get some sleep and continue to take care of herself as much as she is caring for her mother.  Disposition: Status is: Inpatient Remains inpatient appropriate because: Further management   Planned Discharge Destination:  Pending clinical course     Time spent: 35 minutes  Author: Tobey Grim, MD 10/19/2023 4:21 PM  For on call review [email or web address].

## 2023-10-19 NOTE — Progress Notes (Addendum)
 Progress Note  Patient Name: Anita Mccormick Date of Encounter: 10/19/2023  Primary Cardiologist: Peter Swaziland, MD   Subjective   Today, she is feeling well. She denies chest pain, but does have generalized soreness from the CPR. She endorses dyspnea with exertion. We briefly dicussed LHC for elevated troponin level. Daughter and husband present in room and engaged in conversation.   Inpatient Medications    Scheduled Meds:  amLODipine  5 mg Oral Daily   aspirin EC  81 mg Oral Daily   atorvastatin  80 mg Oral Daily   budesonide (PULMICORT) nebulizer solution  0.5 mg Nebulization BID   Chlorhexidine Gluconate Cloth  6 each Topical Daily   dextromethorphan-guaiFENesin  1 tablet Oral BID   feeding supplement (PROSource TF20)  60 mL Per Tube Daily   heparin injection (subcutaneous)  5,000 Units Subcutaneous Q8H   insulin aspart  0-15 Units Subcutaneous TID WC   insulin aspart  0-5 Units Subcutaneous QHS   lidocaine  1 patch Transdermal Q24H   sodium chloride flush  3 mL Intravenous Q12H   Continuous Infusions:  iron sucrose     PRN Meds: acetaminophen **OR** acetaminophen, hydrALAZINE, mouth rinse, mouth rinse, white petrolatum   Vital Signs    Vitals:   10/18/23 1553 10/18/23 2012 10/18/23 2102 10/19/23 0829  BP: (!) 164/61  (!) 166/70 (!) 159/75  Pulse: (!) 101  100 85  Resp: 18  16 17   Temp: 98 F (36.7 C)  99.4 F (37.4 C)   TempSrc:   Oral   SpO2:  (P) 98% 94% 97%  Weight:      Height:        Intake/Output Summary (Last 24 hours) at 10/19/2023 1004 Last data filed at 10/19/2023 0528 Gross per 24 hour  Intake 120 ml  Output 1150 ml  Net -1030 ml   Filed Weights   10/13/23 0815  Weight: 67 kg    Telemetry    Patient is not on telemetry at the moment     Physical Exam   GEN: No acute distress.   Cardiac: RRR, no murmurs, rubs, or gallops.  Respiratory: Clear to auscultation bilaterally. MS: No edema; No deformity. Psych: Normal mood and affect    Labs    Chemistry Recent Labs  Lab 10/13/23 1845 10/13/23 1911 10/14/23 0442 10/14/23 1942 10/15/23 0420 10/16/23 0345 10/18/23 0834  NA 142   < > 138   < > 138 140 141  K 3.9   < > 4.3   < > 4.6 4.4 4.7  CL 101  --  105  --  106 108 108  CO2 21*  --  26  --  24 23 21*  GLUCOSE 306*  --  241*  --  183* 254* 229*  BUN 11  --  13  --  20 26* 15  CREATININE 1.34*  --  1.19*  --  1.22* 1.16* 1.19*  CALCIUM 8.7*  --  8.7*  --  8.6* 8.4* 8.9  PROT 6.6  --  5.4*  --   --   --   --   ALBUMIN 3.4*  --  2.6*  --   --   --   --   AST 58*  --  146*  --   --   --   --   ALT 27  --  44  --   --   --   --   ALKPHOS 69  --  49  --   --   --   --  BILITOT 0.9  --  0.7  --   --   --   --   GFRNONAA 41*  --  47*  --  46* 49* 47*  ANIONGAP 20*  --  7  --  8 9 12    < > = values in this interval not displayed.     Hematology Recent Labs  Lab 10/16/23 0345 10/17/23 0824 10/18/23 0834  WBC 11.6* 9.4 7.8  RBC 2.85* 2.74* 2.66*  HGB 9.9* 9.8* 9.3*  HCT 30.4* 30.2* 28.6*  MCV 106.7* 110.2* 107.5*  MCH 34.7* 35.8* 35.0*  MCHC 32.6 32.5 32.5  RDW 14.7 14.8 14.4  PLT 125* 84* 76*    Cardiac EnzymesNo results for input(s): "TROPONINI" in the last 168 hours. No results for input(s): "TROPIPOC" in the last 168 hours.   BNP Recent Labs  Lab 10/13/23 0944 10/13/23 1853  BNP 205.1* 1,372.6*     DDimer  Recent Labs  Lab 10/13/23 0944  DDIMER 0.61*     Cardiac Studies   Echocardiogram: LVEF 60-65% without regional wall motion abnormalities    Assessment & Plan   #NSTEMI Patient continues to be asymptomatic. Possible type II NSTEMI from hypoxic respiratory failure, arrest, and CPR. With the significant elevation of >24,000 and stable respiratory status, will plan for inpatient LHC on 3/25.   #Cardiac Arrest, PEA arrest 2/2 hypoxic respiratory failure #Influenza A  Patient is stable on room air  #Pneumothorax s/p chest tube removal Stable    For questions or updates,  please contact [facility] Please consult [email or web address] for contact info under Cardiology/STEMI.      Pennelope Bracken, DO  10/19/2023, 10:04 AM     Patient seen, examined. Available data reviewed. Agree with findings, assessment, and plan as outlined by Faith Rogue, DO.  The patient is interviewed and personally examined.  Husband and daughter at the bedside.  [name], alert and oriented, in no distress.  HEENT is normal, JVP is normal, lungs are clear bilaterally, heart is regular rate and rhythm with no murmur gallop, abdomen soft and nontender, extremities have no edema.  All available data reviewed.  In the context of PEA cardiac arrest related hypoxic respiratory failure, the patient had a large non-STEMI by enzymes with a high-sensitivity troponin greater than 24,000.  Despite that, her echocardiogram demonstrated normal LVEF of 60 to 65% with no regional wall motion abnormalities.  She denies any history of angina.  I think cardiac catheterization is indicated to evaluate coronary anatomy.  Important to make sure she does not have a critical lesion involving a large myocardial territory such as a lesion in the left main or proximal LAD.  Otherwise, would highly favor medical therapy. I have reviewed the risks, indications, and alternatives to cardiac catheterization, possible angioplasty, and stenting with the patient. Risks include but are not limited to bleeding, infection, vascular injury, stroke, myocardial infection, arrhythmia, kidney injury, radiation-related injury in the case of prolonged fluoroscopy use, emergency cardiac surgery, and death. The patient understands the risks of serious complication is 1-2 in 1000 with diagnostic cardiac cath and 1-2% or less with angioplasty/stenting.  Will make n.p.o. starting tomorrow morning for cardiac catheterization scheduled tomorrow afternoon.  All of their questions were answered.  Otherwise, as outlined above.  Tonny Bollman, M.D. 10/19/2023 11:56 AM

## 2023-10-19 NOTE — Progress Notes (Signed)
 Mobility Specialist: Progress Note   10/19/23 1609  Mobility  Activity Ambulated with assistance in hallway  Level of Assistance Contact guard assist, steadying assist  Assistive Device Front wheel walker  Distance Ambulated (ft) 65 ft  Activity Response Tolerated well  Mobility Referral Yes  Mobility visit 1 Mobility  Mobility Specialist Start Time (ACUTE ONLY) 1355  Mobility Specialist Stop Time (ACUTE ONLY) 1410  Mobility Specialist Time Calculation (min) (ACUTE ONLY) 15 min    Pt was agreeable to mobility session - received in chair. C/o soreness and pain from CPR compressions. CG throughout but no real physical assistance needed. VSS on 2LO2. Returned to room without fault. A little fatigued by EOS but otherwise asx. Left in bed with all needs met, call bell in reach. Family in room.   Maurene Capes Mobility Specialist Please contact via SecureChat or Rehab office at [phone]

## 2023-10-19 NOTE — Progress Notes (Signed)
 Nutrition Follow-up  DOCUMENTATION CODES:   Non-severe (moderate) malnutrition in context of acute illness/injury  INTERVENTION:   -Continue Ensure Enlive po BID, each supplement provides 350 kcal and 20 grams of protein -Continue MVI with minerals daily -Continue carb modified diet   NUTRITION DIAGNOSIS:   Moderate Malnutrition related to acute illness as evidenced by mild fat depletion, mild muscle depletion.  Ongoing  GOAL:   Patient will meet greater than or equal to 90% of their needs  Progressing   MONITOR:   PO intake, Supplement acceptance  REASON FOR ASSESSMENT:   Ventilator    ASSESSMENT:   78 y.o female with PMH of breast cancer, HTN, HLD, CVA, left eye blindness, T2DM, FALD/cirrhosis, GERD, anemia, CKD 2, Covid 08/2023. Presented with worsening SOB. Found to be Influenza A positive. While awaiting bed placement had cardiac arrest and was intubated.  3/18 - Intubated  3/20- TF initiated 3/21- extubated, TF d/c 3/22- chest tube d/c  Reviewed I/O's: -1 L x 24 hours and +2 L since admission  UOP: 1.1 L x 24 hours   Pt unavailable at time of visit. Attempted to speak with pt via call to hospital room phone, however, unable to reach.   Case discussed with RN, who reports pt tolerating carb modified diet well. She estimates that pt is consuming about 50% of meals. RN thinks she will be open to oral nutrition supplements.   No new wt since admission.   Per TOC notes, plan for possible CIR versus SNF placement.   Labs reviewed: CBGS: 96-255 (inpatient orders for glycemic control are 0-15 units insulin aspart TID with meals and 0-5 units insulin aspart at bedtime). Noted DM coordinator recommendations of 2 units insulin aspart TID with meals.   Diet Order:   Diet Order             Diet NPO time specified  Diet effective midnight           Diet Carb Modified Fluid consistency: Thin; Room service appropriate? Yes  Diet effective now                    EDUCATION NEEDS:   Not appropriate for education at this time  Skin:  Skin Assessment: Reviewed RN Assessment  Last BM:  10/19/23 (type 7)  Height:   Ht Readings from Last 1 Encounters:  10/13/23 5\' 2"  (1.575 m)    Weight:   Wt Readings from Last 1 Encounters:  10/13/23 67 kg    Ideal Body Weight:  50 kg  BMI:  Body mass index is 27.02 kg/m.  Estimated Nutritional Needs:   Kcal:  1500-1700 kcal  Protein:  75-95 gm  Fluid:  >1.5L/day    Levada Schilling, RD, LDN, CDCES Registered Dietitian III Certified Diabetes Care and Education Specialist If unable to reach this RD, please use "RD Inpatient" group chat on secure chat between hours of 8am-4 pm daily

## 2023-10-19 NOTE — TOC Progression Note (Signed)
 Transition of Care [facility]) - Progression Note    Patient Details  Name: Anita Mccormick MRN: [id number] Date of Birth: 1945-09-21  [facility] CM/SW Contact  Janae Bridgeman, RN Phone Number: 10/19/2023, 10:44 AM  Clinical Narrative:    CM met with the patient, spouse and daughter at the bedside to discuss TOC needs.  The patient's daughter states that patient is needing assistance to turn in the bed, mobilize to chair and unable to walk to the bathroom.  The daughter is requesting rehab consult versus SNF placement since the spouse is unable to provide for her mobility needs at this time.  PT/OT will re-evaluate for possible CIR versus SNF placement.  Daughter states that patient has possible pending heart catheterization as well.    Expected Discharge Plan:  (Pending PT evaluation) Barriers to Discharge: Continued Medical Work up  Expected Discharge Plan and Services   Discharge Planning Services: CM Consult Post Acute Care Choice: Skilled Nursing Facility Living arrangements for the past 2 months: Single Family Home                                       Social Determinants of Health (SDOH) Interventions SDOH Screenings   Food Insecurity: No Food Insecurity (10/13/2023)  Housing: Low Risk  (10/13/2023)  Transportation Needs: No Transportation Needs (10/13/2023)  Utilities: Not At Risk (10/13/2023)  Alcohol Screen: Low Risk  (10/03/2022)  Depression (PHQ2-9): Low Risk  (07/06/2023)  Financial Resource Strain: Low Risk  (10/03/2022)  Physical Activity: Inactive (10/03/2022)  Social Connections: Moderately Isolated (10/13/2023)  Stress: No Stress Concern Present (10/03/2022)  Tobacco Use: High Risk (10/13/2023)    Readmission Risk Interventions    10/19/2023   10:43 AM  Readmission Risk Prevention Plan  Post Dischage Appt Complete  Medication Screening Complete  Transportation Screening Complete

## 2023-10-19 NOTE — Inpatient Diabetes Management (Signed)
 Inpatient Diabetes Program Recommendations  AACE/ADA: New Consensus Statement on Inpatient Glycemic Control (2015)  Target Ranges:  Prepandial:   less than 140 mg/dL      Peak postprandial:   less than 180 mg/dL (1-2 hours)      Critically ill patients:  140 - 180 mg/dL   Lab Results  Component Value Date   GLUCAP 255 (H) 10/19/2023   HGBA1C 5.0 10/13/2023    Review of Glycemic Control  Latest Reference Range & Units 10/18/23 15:53 10/18/23 21:15 10/19/23 08:30 10/19/23 12:26  Glucose-Capillary 70 - 99 mg/dL 782 (H) 956 (H) 213 (H) 255 (H)   Diabetes history: None Outpatient Diabetes medications:  Glucotrol XL 10 mg daily Trulicity 1.5 mg weekly Dexcom G7 Current orders for Inpatient glycemic control:  Novolog 0-15 units tid with meals and HS  Inpatient Diabetes Program Recommendations:    May consider adding Novolog 2 units tid with meals (hold if patient eats less than 50% or NPO).   Thanks,  Lorenza Cambridge, RN, BC-ADM Inpatient Diabetes Coordinator Pager [phone]  (8a-5p)

## 2023-10-19 NOTE — H&P (View-Only) (Signed)
 Progress Note  Patient Name: Anita Mccormick Date of Encounter: 10/19/2023  Primary Cardiologist: Peter Swaz[place], MD   Subjective   Today, she is feeling well. She denies chest pain, but does have generalized soreness from the CPR. She endorses dyspnea with exertion. We briefly dicussed LHC for elevated troponin level. Daughter and husband present in room and engaged in conversation.   Inpatient Medications    Scheduled Meds:  amLODipine  5 mg Oral Daily   aspirin EC  81 mg Oral Daily   atorvastatin  80 mg Oral Daily   budesonide (PULMICORT) nebulizer solution  0.5 mg Nebulization BID   Chlorhexidine Gluconate Cloth  6 each Topical Daily   dextromethorphan-guaiFENesin  1 tablet Oral BID   feeding supplement (PROSource TF20)  60 mL Per Tube Daily   heparin injection (subcutaneous)  5,000 Units Subcutaneous Q8H   insulin aspart  0-15 Units Subcutaneous TID WC   insulin aspart  0-5 Units Subcutaneous QHS   lidocaine  1 patch Transdermal Q24H   sodium chloride flush  3 mL Intravenous Q12H   Continuous Infusions:  iron sucrose     PRN Meds: acetaminophen **OR** acetaminophen, hydrALAZINE, mouth rinse, mouth rinse, white petrolatum   Vital Signs    Vitals:   10/18/23 1553 10/18/23 2012 10/18/23 2102 10/19/23 0829  BP: (!) 164/61  (!) 166/70 (!) 159/75  Pulse: (!) 101  100 85  Resp: 18  16 17   Temp: 98 F (36.7 C)  99.4 F (37.4 C)   TempSrc:   Oral   SpO2:  (P) 98% 94% 97%  Weight:      Height:        Intake/Output Summary (Last 24 hours) at 10/19/2023 1004 Last data filed at 10/19/2023 0528 Gross per 24 hour  Intake 120 ml  Output 1150 ml  Net -1030 ml   Filed Weights   10/13/23 0815  Weight: 67 kg    Telemetry    Patient is not on telemetry at the moment     Physical Exam   GEN: No acute distress.   Cardiac: RRR, no murmurs, rubs, or gallops.  Respiratory: Clear to auscultation bilaterally. MS: No edema; No deformity. Psych: Normal mood and affect    Labs    Chemistry Recent Labs  Lab 10/13/23 1845 10/13/23 1911 10/14/23 0442 10/14/23 1942 10/15/23 0420 10/16/23 0345 10/18/23 0834  NA 142   < > 138   < > 138 140 141  K 3.9   < > 4.3   < > 4.6 4.4 4.7  CL 101  --  105  --  106 108 108  CO2 21*  --  26  --  24 23 21*  GLUCOSE 306*  --  241*  --  183* 254* 229*  BUN 11  --  13  --  20 26* 15  CREATININE 1.34*  --  1.19*  --  1.22* 1.16* 1.19*  CALCIUM 8.7*  --  8.7*  --  8.6* 8.4* 8.9  PROT 6.6  --  5.4*  --   --   --   --   ALBUMIN 3.4*  --  2.6*  --   --   --   --   AST 58*  --  146*  --   --   --   --   ALT 27  --  44  --   --   --   --   ALKPHOS 69  --  49  --   --   --   --  BILITOT 0.9  --  0.7  --   --   --   --   GFRNONAA 41*  --  47*  --  46* 49* 47*  ANIONGAP 20*  --  7  --  8 9 12    < > = values in this interval not displayed.     Hematology Recent Labs  Lab 10/16/23 0345 10/17/23 0824 10/18/23 0834  WBC 11.6* 9.4 7.8  RBC 2.85* 2.74* 2.66*  HGB 9.9* 9.8* 9.3*  HCT 30.4* 30.2* 28.6*  MCV 106.7* 110.2* 107.5*  MCH 34.7* 35.8* 35.0*  MCHC 32.6 32.5 32.5  RDW 14.7 14.8 14.4  PLT 125* 84* 76*    Cardiac EnzymesNo results for input(s): "TROPONINI" in the last 168 hours. No results for input(s): "TROPIPOC" in the last 168 hours.   BNP Recent Labs  Lab 10/13/23 0944 10/13/23 1853  BNP 205.1* 1,372.6*     DDimer  Recent Labs  Lab 10/13/23 0944  DDIMER 0.61*     Cardiac Studies   Echocardiogram: LVEF 60-65% without regional wall motion abnormalities    Assessment & Plan   #NSTEMI Patient continues to be asymptomatic. Possible type II NSTEMI from hypoxic respiratory failure, arrest, and CPR. With the significant elevation of >24,000 and stable respiratory status, will plan for inpatient LHC on 3/25.   #Cardiac Arrest, PEA arrest 2/2 hypoxic respiratory failure #Influenza A  Patient is stable on room air  #Pneumothorax s/p chest tube removal Stable    For questions or updates,  please contact [facility] Please consult [email or web address] for contact info under Cardiology/STEMI.      Pennelope Bracken, DO  10/19/2023, 10:04 AM     Patient seen, examined. Available data reviewed. Agree with findings, assessment, and plan as outlined by Faith Rogue, DO.  The patient is interviewed and personally examined.  Husband and daughter at the bedside.  [name], alert and oriented, in no distress.  HEENT is normal, JVP is normal, lungs are clear bilaterally, heart is regular rate and rhythm with no murmur gallop, abdomen soft and nontender, extremities have no edema.  All available data reviewed.  In the context of PEA cardiac arrest related hypoxic respiratory failure, the patient had a large non-STEMI by enzymes with a high-sensitivity troponin greater than 24,000.  Despite that, her echocardiogram demonstrated normal LVEF of 60 to 65% with no regional wall motion abnormalities.  She denies any history of angina.  I think cardiac catheterization is indicated to evaluate coronary anatomy.  Important to make sure she does not have a critical lesion involving a large myocardial territory such as a lesion in the left main or proximal LAD.  Otherwise, would highly favor medical therapy. I have reviewed the risks, indications, and alternatives to cardiac catheterization, possible angioplasty, and stenting with the patient. Risks include but are not limited to bleeding, infection, vascular injury, stroke, myocardial infection, arrhythmia, kidney injury, radiation-related injury in the case of prolonged fluoroscopy use, emergency cardiac surgery, and death. The patient understands the risks of serious complication is 1-2 in 1000 with diagnostic cardiac cath and 1-2% or less with angioplasty/stenting.  Will make n.p.o. starting tomorrow morning for cardiac catheterization scheduled tomorrow afternoon.  All of their questions were answered.  Otherwise, as outlined above.  Tonny Bollman, M.D. 10/19/2023 11:56 AM

## 2023-10-19 NOTE — Progress Notes (Signed)
 PT Cancellation Note  Patient Details Name: ANJANA CHEEK MRN: [id number] DOB: 11-04-1945   Cancelled Treatment:    Reason Eval/Treat Not Completed: Fatigue/lethargy limiting ability to participate  Patient reported walked in hallway and sat up in chair almost all day. Reports just recently back to bed and too tired to get up. Noted plans for [facility] 3/25 and family ?'g if AIR vs SNF would be appropriate. Will attempt to see 3/25.    Jerolyn Center, PT Acute Rehabilitation Services  Office [phone]  Zena Amos 10/19/2023, 3:20 PM

## 2023-10-20 ENCOUNTER — Encounter (HOSPITAL_COMMUNITY)
Admission: EM | Disposition: A | Discharge status: Home Health | Payer: Self-pay | Source: Home / Self Care | Attending: Internal Medicine

## 2023-10-20 DIAGNOSIS — I214 Non-ST elevation (NSTEMI) myocardial infarction: Secondary | ICD-10-CM | POA: Diagnosis not present

## 2023-10-20 DIAGNOSIS — I2510 Atherosclerotic heart disease of native coronary artery without angina pectoris: Secondary | ICD-10-CM | POA: Diagnosis not present

## 2023-10-20 DIAGNOSIS — J9601 Acute respiratory failure with hypoxia: Secondary | ICD-10-CM | POA: Diagnosis not present

## 2023-10-20 HISTORY — PX: LEFT HEART CATH AND CORONARY ANGIOGRAPHY: CATH118249

## 2023-10-20 LAB — CBC
HCT: 27.5 % — ABNORMAL LOW (ref 36.0–46.0)
Hemoglobin: 9.1 g/dL — ABNORMAL LOW (ref 12.0–15.0)
MCH: 34.7 pg — ABNORMAL HIGH (ref 26.0–34.0)
MCHC: 33.1 g/dL (ref 30.0–36.0)
MCV: 105 fL — ABNORMAL HIGH (ref 80.0–100.0)
Platelets: 111 10*3/uL — ABNORMAL LOW (ref 150–400)
RBC: 2.62 MIL/uL — ABNORMAL LOW (ref 3.87–5.11)
RDW: 13.7 % (ref 11.5–15.5)
WBC: 4.7 10*3/uL (ref 4.0–10.5)
nRBC: 0 % (ref 0.0–0.2)

## 2023-10-20 LAB — BASIC METABOLIC PANEL
Anion gap: 9 (ref 5–15)
BUN: 17 mg/dL (ref 8–23)
CO2: 23 mmol/L (ref 22–32)
Calcium: 8.6 mg/dL — ABNORMAL LOW (ref 8.9–10.3)
Chloride: 107 mmol/L (ref 98–111)
Creatinine, Ser: 0.94 mg/dL (ref 0.44–1.00)
GFR, Estimated: >60 mL/min (ref >60)
Glucose, Bld: 165 mg/dL — ABNORMAL HIGH (ref 70–99)
Potassium: 3.6 mmol/L (ref 3.5–5.1)
Sodium: 139 mmol/L (ref 135–145)

## 2023-10-20 LAB — GLUCOSE, CAPILLARY
Glucose-Capillary: 177 mg/dL — ABNORMAL HIGH (ref 70–99)
Glucose-Capillary: 258 mg/dL — ABNORMAL HIGH (ref 70–99)
Glucose-Capillary: 135 mg/dL — ABNORMAL HIGH (ref 70–99)

## 2023-10-20 MED ORDER — MIDAZOLAM HCL 2 MG/2ML IJ SOLN
INTRAMUSCULAR | Status: DC | PRN
Start: 2023-10-20 — End: 2023-10-20

## 2023-10-20 MED ORDER — SODIUM CHLORIDE 0.9% FLUSH: 3.0000 mL | INTRAVENOUS | Status: DC | PRN

## 2023-10-20 MED ORDER — HEPARIN (PORCINE) IN NACL 1000-0.9 UT/500ML-% IV SOLN
INTRAVENOUS | Status: DC | PRN
  Administered 2023-10-20 (×2): 500 mL

## 2023-10-20 MED ORDER — MIDAZOLAM HCL 2 MG/2ML IJ SOLN
INTRAMUSCULAR | Status: AC
  Filled 2023-10-20: qty 2

## 2023-10-20 MED ORDER — IOHEXOL 350 MG/ML SOLN
INTRAVENOUS | Status: DC | PRN
  Administered 2023-10-20: 42 mL

## 2023-10-20 MED ORDER — SODIUM CHLORIDE 0.9% FLUSH
3.0000 mL | Freq: Two times a day (BID) | INTRAVENOUS | Status: DC
  Administered 2023-10-20 – 2023-10-24 (×9): 3 mL via INTRAVENOUS

## 2023-10-20 MED ORDER — VERAPAMIL HCL 2.5 MG/ML IV SOLN
INTRAVENOUS | Status: DC | PRN
  Administered 2023-10-20: 10 mL via INTRA_ARTERIAL

## 2023-10-20 MED ORDER — FENTANYL CITRATE (PF) 100 MCG/2ML IJ SOLN
INTRAMUSCULAR | Status: AC
  Filled 2023-10-20: qty 2

## 2023-10-20 MED ORDER — FENTANYL CITRATE (PF) 100 MCG/2ML IJ SOLN: INTRAMUSCULAR | Status: DC | PRN

## 2023-10-20 MED ORDER — SODIUM CHLORIDE 0.9 % IV SOLN: 250.0000 mL | INTRAVENOUS | Status: AC | PRN

## 2023-10-20 MED ORDER — SODIUM CHLORIDE 0.9 % WEIGHT BASED INFUSION
1.0000 mL/kg/h | INTRAVENOUS | Status: DC
  Administered 2023-10-20: 1 mL/kg/h via INTRAVENOUS

## 2023-10-20 MED ORDER — HEPARIN SODIUM (PORCINE) 5000 UNIT/ML IJ SOLN
5000.0000 [IU] | Freq: Three times a day (TID) | INTRAMUSCULAR | Status: DC
  Administered 2023-10-20 – 2023-11-09 (×56): 5000 [IU] via SUBCUTANEOUS
  Filled 2023-10-20 (×57): qty 1

## 2023-10-20 MED ORDER — LIDOCAINE HCL (PF) 1 % IJ SOLN
INTRAMUSCULAR | Status: AC
  Filled 2023-10-20: qty 30

## 2023-10-20 MED ORDER — SODIUM CHLORIDE 0.9 % WEIGHT BASED INFUSION
3.0000 mL/kg/h | INTRAVENOUS | Status: DC
  Administered 2023-10-20: 3 mL/kg/h via INTRAVENOUS

## 2023-10-20 MED ORDER — HEPARIN SODIUM (PORCINE) 1000 UNIT/ML IJ SOLN
INTRAMUSCULAR | Status: AC
  Filled 2023-10-20: qty 10

## 2023-10-20 MED ORDER — SODIUM CHLORIDE 0.9 % IV BOLUS
500.0000 mL | Freq: Once | INTRAVENOUS | Status: AC
  Administered 2023-10-20: 500 mL via INTRAVENOUS

## 2023-10-20 MED ORDER — VERAPAMIL HCL 2.5 MG/ML IV SOLN
INTRAVENOUS | Status: AC
  Filled 2023-10-20: qty 2

## 2023-10-20 MED ORDER — LIDOCAINE HCL (PF) 1 % IJ SOLN
INTRAMUSCULAR | Status: DC | PRN
  Administered 2023-10-20: 2 mL via INTRADERMAL

## 2023-10-20 MED ORDER — ASPIRIN 81 MG PO CHEW
81.0000 mg | CHEWABLE_TABLET | ORAL | Status: AC
  Administered 2023-10-20: 81 mg via ORAL
  Filled 2023-10-20: qty 1

## 2023-10-20 MED ORDER — IPRATROPIUM-ALBUTEROL 0.5-2.5 (3) MG/3ML IN SOLN: 3.0000 mL | RESPIRATORY_TRACT | Status: DC | PRN

## 2023-10-20 SURGICAL SUPPLY — 11 items
CATH 5FR JL3.5 JR4 ANG PIG MP (CATHETERS) IMPLANT
DEVICE RAD TR BAND REGULAR (VASCULAR PRODUCTS) IMPLANT
GLIDESHEATH SLEND SS 6F .021 (SHEATH) IMPLANT
GUIDEWIRE INQWIRE 1.5J.035X260 (WIRE) IMPLANT
INQWIRE 1.5J .035X260CM (WIRE) ×1 IMPLANT
KIT SINGLE USE MANIFOLD (KITS) IMPLANT
PACK CARDIAC CATHETERIZATION (CUSTOM PROCEDURE TRAY) ×1 IMPLANT
SET ATX-X65L (MISCELLANEOUS) IMPLANT
SHEATH GLIDE SLENDER 4/5FR (SHEATH) IMPLANT
SHEATH PROBE COVER 6X72 (BAG) IMPLANT
WIRE HI TORQ VERSACORE-J 145CM (WIRE) IMPLANT

## 2023-10-20 NOTE — Progress Notes (Addendum)
   Patient Name: Anita Mccormick Date of Encounter: 10/20/2023 [facility] Cardiologist: Peter Swaziland, MD   Interval Summary  .    Patient is feeling tired this morning. She denies chest pain or dyspnea today.   Vital Signs .    Vitals:   10/19/23 0829 10/19/23 1939 10/19/23 2026 10/20/23 0519  BP: (!) 159/75  135/65 (!) 121/52  Pulse: 85  85 82  Resp: 17  18 18   Temp:   97.8 F (36.6 C) 99.4 F (37.4 C)  TempSrc:   Oral Oral  SpO2: 97% 96% 100% 95%  Weight:      Height:        Intake/Output Summary (Last 24 hours) at 10/20/2023 0803 Last data filed at 10/20/2023 4098 Gross per 24 hour  Intake 467.88 ml  Output --  Net 467.88 ml      10/13/2023    8:15 AM 08/26/2023    1:28 PM 07/06/2023    8:18 AM  Last 3 Weights  Weight (lbs) 147 lb 11.3 oz 149 lb 3.2 oz 148 lb 3.2 oz  Weight (kg) 67 kg 67.677 kg 67.223 kg      Physical Exam .   GEN: No acute distress.  Cardiac: RRR, no murmurs, rubs, or gallops.  Respiratory: Clear to auscultation bilaterally. MS: No edema  Assessment & Plan .     #NSTEMI Patient remains asymptomatic. Possible type II NSTEMI from hypoxic respiratory failure, arrest, and CPR. With the significant elevation of >24,000 and stable respiratory status, LHC scheduled for today to evaluate type I vs type II NSTEMI.   #Cardiac Arrest, PEA arrest 2/2 hypoxic respiratory failure #Influenza A  Patient is stable on room air   #Pneumothorax s/p chest tube removal Stable   For questions or updates, please contact [facility] Please consult [email or web address] for contact info under      Signed, Faith Rogue, DO    Patient seen, examined. Available data reviewed. Agree with findings, assessment, and plan as outlined by Dr Hessie Diener.  Patient is independently interviewed and examined.  Elderly woman in no distress.  HEENT normal, JVP normal, heart regular rate and rhythm no murmur gallop, lungs with diffuse rhonchi, abdomen soft and  nontender, extremities without edema.  Telemetry is reviewed and shows sinus rhythm without significant arrhythmia.  Patient scheduled for cardiac catheterization this morning.  See note from yesterday as I reviewed risks, indications, and alternatives with the patient and her family members.  They provided full informed consent.  Daughter and husband are both present at the bedside this morning.  PCI indicated only if severe proximal coronary lesion is noted as I suspect her infarct even with large troponin elevation is related to demand ischemia and a type II non-STEMI from cardiac arrest.  Otherwise, as outlined above.  Tonny Bollman, M.D. 10/20/2023 10:44 AM

## 2023-10-20 NOTE — Progress Notes (Signed)
 PROGRESS NOTE    CORTNE AMARA  ZOX:[id number] DOB: 03-26-1946 DOA: 10/13/2023 PCP: Deeann Saint, MD   Brief Narrative:  78 year old female with history of tobacco abuse, hypertension, hyperlipidemia, unspecified CVA, diabetes mellitus type 2, CKD stage II, chronic diastolic heart failure, anemia, breast cancer, NAFLD/cirrhosis and COVID-19 infection in 08/2023 presented with worsening shortness of breath.  On presentation, she required supplemental oxygen and was found to be influenza A positive; chest x-ray was negative for acute infiltrates.  She was started on Tamiflu.  Subsequently, had a syncopal event and was found to be more hypoxic.  Later on, she was found to be in PEA bradycardic/asystolic arrest with ROSC after 3 rounds of ACLS.  She was subsequently intubated and transferred to ICU.  PCCM and cardiology were consulted.  She was then found to have left pneumothorax for which pigtail catheter was placed.  She also needed vasopressors for shock along with IV antibiotics.  Troponins trended to more than 24,000.  She was started on heparin drip.  Echo showed EF of 60 to 65% with no valvular issues.  She was also started on IV Solu-Medrol by PCCM.  Subsequently, vasopressors were stopped.  She was extubated on 10/17/2023.Chest tube was subsequently removed.  She was transferred to [facility] service from 10/18/2023 onwards.  Cardiology planning for cardiac catheterization today.  Assessment & Plan:   PEA cardiac arrest Non-STEMI History of chronic diastolic heart failure Probable cardiogenic shock: Resolved Acute respiratory failure with hypoxia -With ROSC after 3 rounds of CPR. -Required ICU care with vasopressors/ventilatory support/broad-spectrum antibiotics -Subsequently extubated and weaned off vasopressors.  Transferred to [facility] service from 10/18/2023 onwards -Troponins trended to more than 24,000.  Treated with heparin drip and subsequently discontinued.  Echo showed EF of 60 to 65% with no  valvular issues.  -Cardiology following: Planning for cardiac catheterization today.  Continue aspirin, statin -Currently on 4 L oxygen via nasal cannula.  Wean off as able.  Influenza A pneumonia COPD exacerbation -Treated with Tamiflu, steroids for 5 days.  Also completed 5 days of Rocephin.  Continue current nebs.  Add DuoNeb as needed.  Pneumothorax -Required pigtail catheter placement which has subsequently been removed.  History of hypertension -Blood pressure improved.  Currently not on any antihypertensives.  Syncope -During the hospitalization.  Echo as above.  Telemetry with no abnormalities.  Continue telemetry monitoring.    Thrombocytopenia -Questionable cause.  Monitor.  No signs of bleeding  Leukocytosis -Resolved  Macrocytic anemia Possible iron deficiency anemia. -B12 and folate normal. -IV iron has been ordered: Will probably give after cardiac catheterization  History of unspecified CVA Hyperlipidemia -No deficits.  Continue aspirin and statin  Diabetes mellitus type 2 with hyperglycemia -Continue CBGs with SSI.  A1c 5  NAFLD/cirrhosis -Appears compensated.  Outpatient follow-up with GI  History of breast cancer -Outpatient follow-up with oncology  Physical deconditioning -PT following and recommending home health PT  DVT prophylaxis: Heparin subcutaneous Code Status: Full Family Communication: Husband and daughter at bedside Disposition Plan: Status is: Inpatient Remains inpatient appropriate because: Of severity of illness    Consultants: PCCM/cardiology  Procedures: As above  Antimicrobials:  Anti-infectives (From admission, onward)    Start     Dose/Rate Route Frequency Ordered Stop   10/17/23 1000  oseltamivir (TAMIFLU) capsule 30 mg        30 mg Oral 2 times daily 10/17/23 0728 10/18/23 2109   10/15/23 1000  oseltamivir (TAMIFLU) capsule 30 mg  Status:  Discontinued  30 mg Per Tube 2 times daily 10/15/23 0735 10/17/23 0729    10/14/23 1200  metroNIDAZOLE (FLAGYL) IVPB 500 mg  Status:  Discontinued        500 mg 100 mL/hr over 60 Minutes Intravenous Every 12 hours 10/13/23 2357 10/14/23 0935   10/14/23 1030  azithromycin (ZITHROMAX) 500 mg in sodium chloride 0.9 % 250 mL IVPB        500 mg 250 mL/hr over 60 Minutes Intravenous Every 24 hours 10/14/23 0935 10/16/23 1038   10/14/23 1000  oseltamivir (TAMIFLU) capsule 30 mg  Status:  Discontinued        30 mg Per Tube Daily 10/14/23 0935 10/15/23 0735   10/14/23 0000  cefTRIAXone (ROCEPHIN) 2 g in sodium chloride 0.9 % 100 mL IVPB        2 g 200 mL/hr over 30 Minutes Intravenous Every 24 hours 10/13/23 2357 10/17/23 2339   10/13/23 2200  oseltamivir (TAMIFLU) capsule 30 mg  Status:  Discontinued        30 mg Oral 2 times daily 10/13/23 1247 10/13/23 1847   10/13/23 2015  oseltamivir (TAMIFLU) capsule 30 mg  Status:  Discontinued        30 mg Per Tube Daily 10/13/23 1847 10/14/23 0935   10/13/23 1145  oseltamivir (TAMIFLU) capsule 75 mg        75 mg Oral  Once 10/13/23 1133 10/13/23 1141        Subjective: Patient seen and examined at bedside.  Complains of some chest wall soreness and cough.  Denies fever, vomiting.  Objective: Vitals:   10/19/23 2026 10/20/23 0519 10/20/23 0833 10/20/23 0853  BP: 135/65 (!) 121/52 131/72   Pulse: 85 82 86   Resp: 18 18 18    Temp: 97.8 F (36.6 C) 99.4 F (37.4 C) 98.6 F (37 C)   TempSrc: Oral Oral Oral   SpO2: 100% 95% 95% 91%  Weight:      Height:        Intake/Output Summary (Last 24 hours) at 10/20/2023 1032 Last data filed at 10/20/2023 0620 Gross per 24 hour  Intake 467.88 ml  Output --  Net 467.88 ml   Filed Weights   10/13/23 0815  Weight: 67 kg    Examination:  General exam: Appears calm and comfortable.  On 4 L oxygen via nasal cannula Respiratory system: Bilateral decreased breath sounds at bases with scattered crackles Cardiovascular system: S1 & S2 heard, Rate  controlled Gastrointestinal system: Abdomen is nondistended, soft and nontender. Normal bowel sounds heard. Extremities: No cyanosis, clubbing; trace lower extremity edema  Central nervous system: Alert and oriented. No focal neurological deficits. Moving extremities Skin: No rashes, lesions or ulcers Psychiatry: Judgement and insight appear normal. Mood & affect appropriate.     Data Reviewed: I have personally reviewed following labs and imaging studies  CBC: Recent Labs  Lab 10/16/23 0345 10/17/23 0824 10/18/23 0834 10/19/23 1043 10/20/23 0621  WBC 11.6* 9.4 7.8 8.3 4.7  NEUTROABS  --   --  6.7 6.9  --   HGB 9.9* 9.8* 9.3* 11.0* 9.1*  HCT 30.4* 30.2* 28.6* 34.4* 27.5*  MCV 106.7* 110.2* 107.5* 108.5* 105.0*  PLT 125* 84* 76* 106* 111*   Basic Metabolic Panel: Recent Labs  Lab 10/13/23 1845 10/13/23 1911 10/14/23 0442 10/14/23 1942 10/15/23 0420 10/15/23 0830 10/16/23 0345 10/18/23 0834 10/19/23 1043 10/20/23 0621  NA 142   < > 138   < > 138  --  140 141 141  139  K 3.9   < > 4.3   < > 4.6  --  4.4 4.7 4.6 3.6  CL 101  --  105  --  106  --  108 108 109 107  CO2 21*  --  26  --  24  --  23 21* 22 23  GLUCOSE 306*  --  241*  --  183*  --  254* 229* 186* 165*  BUN 11  --  13  --  20  --  26* 15 21 17   CREATININE 1.34*  --  1.19*  --  1.22*  --  1.16* 1.19* 0.94 0.94  CALCIUM 8.7*  --  8.7*  --  8.6*  --  8.4* 8.9 9.3 8.6*  MG 2.1  --  1.5*  --   --  2.7*  --   --   --   --   PHOS  --   --  3.0  --   --   --   --   --   --   --    < > = values in this interval not displayed.   GFR: Estimated Creatinine Clearance: 45.7 mL/min (by C-G formula based on SCr of 0.94 mg/dL). Liver Function Tests: Recent Labs  Lab 10/13/23 1845 10/14/23 0442  AST 58* 146*  ALT 27 44  ALKPHOS 69 49  BILITOT 0.9 0.7  PROT 6.6 5.4*  ALBUMIN 3.4* 2.6*   No results for input(s): "LIPASE", "AMYLASE" in the last 168 hours. No results for input(s): "AMMONIA" in the last 168  hours. Coagulation Profile: No results for input(s): "INR", "PROTIME" in the last 168 hours. Cardiac Enzymes: No results for input(s): "CKTOTAL", "CKMB", "CKMBINDEX", "TROPONINI" in the last 168 hours. BNP (last 3 results) No results for input(s): "PROBNP" in the last 8760 hours. HbA1C: No results for input(s): "HGBA1C" in the last 72 hours. CBG: Recent Labs  Lab 10/19/23 0830 10/19/23 1226 10/19/23 1738 10/19/23 2028 10/20/23 0839  GLUCAP 167* 255* 114* 148* 177*   Lipid Profile: No results for input(s): "CHOL", "HDL", "LDLCALC", "TRIG", "CHOLHDL", "LDLDIRECT" in the last 72 hours. Thyroid Function Tests: No results for input(s): "TSH", "T4TOTAL", "FREET4", "T3FREE", "THYROIDAB" in the last 72 hours. Anemia Panel: Recent Labs    10/17/23 1325 10/17/23 1326 10/18/23 0834  VITAMINB12 1,043*  --   --   FOLATE  --  16.3  --   TIBC  --   --  270  IRON  --   --  21*   Sepsis Labs: Recent Labs  Lab 10/13/23 2022 10/13/23 2135 10/14/23 1105 10/14/23 1400 10/15/23 1122  PROCALCITON  --   --   --   --  1.35  LATICACIDVEN 6.3* 6.1* 3.2* 2.2*  --     Recent Results (from the past 240 hours)  Resp panel by RT-PCR (RSV, Flu A&B, Covid) Anterior Nasal Swab     Status: Abnormal   Collection Time: 10/13/23  8:31 AM   Specimen: Anterior Nasal Swab  Result Value Ref Range Status   SARS Coronavirus 2 by RT PCR NEGATIVE NEGATIVE Final   Influenza A by PCR POSITIVE (A) NEGATIVE Final   Influenza B by PCR NEGATIVE NEGATIVE Final    Comment: (NOTE) The Xpert Xpress SARS-CoV-2/FLU/RSV plus assay is intended as an aid in the diagnosis of influenza from Nasopharyngeal swab specimens and should not be used as a sole basis for treatment. Nasal washings and aspirates are unacceptable for Xpert Xpress SARS-CoV-2/FLU/RSV testing.  Fact Sheet for Patients: BloggerCourse.com  Fact Sheet for Healthcare Providers: SeriousBroker.it  This  test is not yet approved or cleared by the [place] FDA and has been authorized for detection and/or diagnosis of SARS-CoV-2 by FDA under an Emergency Use Authorization (EUA). This EUA will remain in effect (meaning this test can be used) for the duration of the COVID-19 declaration under Section 564(b)(1) of the Act, 21 U.S.C. section 360bbb-3(b)(1), unless the authorization is terminated or revoked.     Resp Syncytial Virus by PCR NEGATIVE NEGATIVE Final    Comment: (NOTE) Fact Sheet for Patients: BloggerCourse.com  Fact Sheet for Healthcare Providers: SeriousBroker.it  This test is not yet approved or cleared by the [place] FDA and has been authorized for detection and/or diagnosis of SARS-CoV-2 by FDA under an Emergency Use Authorization (EUA). This EUA will remain in effect (meaning this test can be used) for the duration of the COVID-19 declaration under Section 564(b)(1) of the Act, 21 U.S.C. section 360bbb-3(b)(1), unless the authorization is terminated or revoked.  Performed at [facility], [address] [place], [place] [address]   MRSA Next Gen by PCR, Nasal     Status: None   Collection Time: 10/13/23  7:53 PM   Specimen: Nasal Mucosa; Nasal Swab  Result Value Ref Range Status   MRSA by PCR Next Gen NOT DETECTED NOT DETECTED Final    Comment: (NOTE) The GeneXpert MRSA Assay (FDA approved for NASAL specimens only), is one component of a comprehensive MRSA colonization surveillance program. It is not intended to diagnose MRSA infection nor to guide or monitor treatment for MRSA infections. Test performance is not FDA approved in patients less than 35 years old. Performed at [facility], [address] [place], [place] [address]          Radiology Studies: No results found.      Scheduled Meds:  [MAR Hold] amLODipine  5 mg Oral Daily   [MAR Hold] aspirin EC  81 mg Oral Daily    [MAR Hold] atorvastatin  80 mg Oral Daily   [MAR Hold] budesonide (PULMICORT) nebulizer solution  0.5 mg Nebulization BID   [MAR Hold] Chlorhexidine Gluconate Cloth  6 each Topical Daily   [MAR Hold] dextromethorphan-guaiFENesin  1 tablet Oral BID   [MAR Hold] feeding supplement  237 mL Oral BID BM   [MAR Hold] heparin injection (subcutaneous)  5,000 Units Subcutaneous Q8H   [MAR Hold] insulin aspart  0-15 Units Subcutaneous TID WC   [MAR Hold] insulin aspart  0-5 Units Subcutaneous QHS   [MAR Hold] multivitamin with minerals  1 tablet Oral Daily   [MAR Hold] sodium chloride flush  3 mL Intravenous Q12H   Continuous Infusions:  sodium chloride 1 mL/kg/hr (10/20/23 0620)   [MAR Hold] iron sucrose Stopped (10/19/23 1238)          Glade Lloyd, MD [facility] 10/20/2023, 10:32 AM

## 2023-10-20 NOTE — Interval H&P Note (Signed)
 History and Physical Interval Note:  10/20/2023 10:22 AM  Florestine Avers  has presented today for surgery, with the diagnosis of nstemi.  The various methods of treatment have been discussed with the patient and family. After consideration of risks, benefits and other options for treatment, the patient has consented to  Procedure(s): LEFT HEART CATH AND CORONARY ANGIOGRAPHY (N/A) as a surgical intervention.  The patient's history has been reviewed, patient examined, no change in status, stable for surgery.  I have reviewed the patient's chart and labs.  Questions were answered to the patient's satisfaction.   Cath Lab Visit (complete for each Cath Lab visit)  Clinical Evaluation Leading to the Procedure:   ACS: Yes.    Non-ACS:    Anginal Classification: CCS I  Anti-ischemic medical therapy: Minimal Therapy (1 class of medications)  Non-Invasive Test Results: No non-invasive testing performed  Prior CABG: No previous CABG        Theron Arista [facility] 10/20/2023 10:22 AM

## 2023-10-20 NOTE — Progress Notes (Signed)
 IV Team attempted to start IV w/o success. Permission received from Dr. Swaziland that patient can be transferred up to [facility] w/o IV. IV Team will follow up once patient is on floor.

## 2023-10-20 NOTE — Plan of Care (Signed)
   Problem: Education: Goal: Ability to describe self-care measures that may prevent or decrease complications (Diabetes Survival Skills Education) will improve Outcome: Progressing Goal: Individualized Educational Video(s) Outcome: Progressing   Problem: Coping: Goal: Ability to adjust to condition or change in health will improve Outcome: Progressing   Problem: Fluid Volume: Goal: Ability to maintain a balanced intake and output will improve Outcome: Progressing   Problem: Health Behavior/Discharge Planning: Goal: Ability to identify and utilize available resources and services will improve Outcome: Progressing Goal: Ability to manage health-related needs will improve Outcome: Progressing   Problem: Metabolic: Goal: Ability to maintain appropriate glucose levels will improve Outcome: Progressing   Problem: Nutritional: Goal: Maintenance of adequate nutrition will improve Outcome: Progressing Goal: Progress toward achieving an optimal weight will improve Outcome: Progressing   Problem: Skin Integrity: Goal: Risk for impaired skin integrity will decrease Outcome: Progressing   Problem: Tissue Perfusion: Goal: Adequacy of tissue perfusion will improve Outcome: Progressing   Problem: Activity: Goal: Ability to tolerate increased activity will improve Outcome: Progressing   Problem: Respiratory: Goal: Ability to maintain a clear airway and adequate ventilation will improve Outcome: Progressing   Problem: Role Relationship: Goal: Method of communication will improve Outcome: Progressing   Problem: Education: Goal: Knowledge of General Education information will improve Description: Including pain rating scale, medication(s)/side effects and non-pharmacologic comfort measures Outcome: Progressing   Problem: Health Behavior/Discharge Planning: Goal: Ability to manage health-related needs will improve Outcome: Progressing   Problem: Clinical Measurements: Goal:  Ability to maintain clinical measurements within normal limits will improve Outcome: Progressing Goal: Will remain free from infection Outcome: Progressing Goal: Diagnostic test results will improve Outcome: Progressing Goal: Respiratory complications will improve Outcome: Progressing Goal: Cardiovascular complication will be avoided Outcome: Progressing   Problem: Activity: Goal: Risk for activity intolerance will decrease Outcome: Progressing   Problem: Nutrition: Goal: Adequate nutrition will be maintained Outcome: Progressing   Problem: Coping: Goal: Level of anxiety will decrease Outcome: Progressing   Problem: Elimination: Goal: Will not experience complications related to bowel motility Outcome: Progressing Goal: Will not experience complications related to urinary retention Outcome: Progressing   Problem: Pain Managment: Goal: General experience of comfort will improve and/or be controlled Outcome: Progressing   Problem: Safety: Goal: Ability to remain free from injury will improve Outcome: Progressing   Problem: Skin Integrity: Goal: Risk for impaired skin integrity will decrease Outcome: Progressing

## 2023-10-20 NOTE — Progress Notes (Signed)
 PT Cancellation Note  Patient Details Name: Anita Mccormick MRN: [id number] DOB: 11/15/1945   Cancelled Treatment:    Reason Eval/Treat Not Completed: Patient at procedure or test/unavailable. Will re-attempt later if time allows.    Kendra Grissett 10/20/2023, 11:27 AM

## 2023-10-21 ENCOUNTER — Telehealth: Payer: Self-pay

## 2023-10-21 ENCOUNTER — Ambulatory Visit (INDEPENDENT_AMBULATORY_CARE_PROVIDER_SITE_OTHER): Payer: Medicare HMO | Admitting: Podiatry

## 2023-10-21 ENCOUNTER — Encounter (HOSPITAL_COMMUNITY): Payer: Self-pay | Admitting: Cardiology

## 2023-10-21 ENCOUNTER — Other Ambulatory Visit: Payer: Medicare HMO

## 2023-10-21 DIAGNOSIS — I214 Non-ST elevation (NSTEMI) myocardial infarction: Secondary | ICD-10-CM

## 2023-10-21 DIAGNOSIS — N182 Chronic kidney disease, stage 2 (mild): Secondary | ICD-10-CM | POA: Diagnosis not present

## 2023-10-21 DIAGNOSIS — I10 Essential (primary) hypertension: Secondary | ICD-10-CM | POA: Diagnosis not present

## 2023-10-21 DIAGNOSIS — E1129 Type 2 diabetes mellitus with other diabetic kidney complication: Secondary | ICD-10-CM | POA: Diagnosis not present

## 2023-10-21 DIAGNOSIS — Z91198 Patient's noncompliance with other medical treatment and regimen for other reason: Secondary | ICD-10-CM

## 2023-10-21 LAB — CBC WITH DIFFERENTIAL/PLATELET
Abs Immature Granulocytes: 0.06 10*3/uL (ref 0.00–0.07)
Basophils Absolute: 0 10*3/uL (ref 0.0–0.1)
Basophils Relative: 0 %
Eosinophils Absolute: 0.1 10*3/uL (ref 0.0–0.5)
Eosinophils Relative: 1 %
HCT: 26.2 % — ABNORMAL LOW (ref 36.0–46.0)
Hemoglobin: 8.6 g/dL — ABNORMAL LOW (ref 12.0–15.0)
Immature Granulocytes: 1 %
Lymphocytes Relative: 7 %
Lymphs Abs: 0.4 10*3/uL — ABNORMAL LOW (ref 0.7–4.0)
MCH: 34.4 pg — ABNORMAL HIGH (ref 26.0–34.0)
MCHC: 32.8 g/dL (ref 30.0–36.0)
MCV: 104.8 fL — ABNORMAL HIGH (ref 80.0–100.0)
Monocytes Absolute: 0.6 10*3/uL (ref 0.1–1.0)
Monocytes Relative: 10 %
Neutro Abs: 4.4 10*3/uL (ref 1.7–7.7)
Neutrophils Relative %: 81 %
Platelets: 124 10*3/uL — ABNORMAL LOW (ref 150–400)
RBC: 2.5 MIL/uL — ABNORMAL LOW (ref 3.87–5.11)
RDW: 13.6 % (ref 11.5–15.5)
WBC: 5.5 10*3/uL (ref 4.0–10.5)
nRBC: 0.4 % — ABNORMAL HIGH (ref 0.0–0.2)

## 2023-10-21 LAB — BASIC METABOLIC PANEL
Anion gap: 8 (ref 5–15)
BUN: 12 mg/dL (ref 8–23)
CO2: 24 mmol/L (ref 22–32)
Calcium: 8.1 mg/dL — ABNORMAL LOW (ref 8.9–10.3)
Chloride: 108 mmol/L (ref 98–111)
Creatinine, Ser: 0.93 mg/dL (ref 0.44–1.00)
GFR, Estimated: >60 mL/min (ref >60)
Glucose, Bld: 134 mg/dL — ABNORMAL HIGH (ref 70–99)
Potassium: 3.5 mmol/L (ref 3.5–5.1)
Sodium: 140 mmol/L (ref 135–145)

## 2023-10-21 LAB — MAGNESIUM: Magnesium: 1.9 mg/dL (ref 1.7–2.4)

## 2023-10-21 LAB — GLUCOSE, CAPILLARY
Glucose-Capillary: 133 mg/dL — ABNORMAL HIGH (ref 70–99)
Glucose-Capillary: 175 mg/dL — ABNORMAL HIGH (ref 70–99)
Glucose-Capillary: 125 mg/dL — ABNORMAL HIGH (ref 70–99)
Glucose-Capillary: 131 mg/dL — ABNORMAL HIGH (ref 70–99)

## 2023-10-21 MED ORDER — POTASSIUM CHLORIDE CRYS ER 20 MEQ PO TBCR
40.0000 meq | EXTENDED_RELEASE_TABLET | Freq: Once | ORAL | Status: AC
  Administered 2023-10-21: 40 meq via ORAL
  Filled 2023-10-21: qty 2

## 2023-10-21 MED ORDER — METOPROLOL SUCCINATE ER 25 MG PO TB24
25.0000 mg | ORAL_TABLET | Freq: Every day | ORAL | Status: DC
  Administered 2023-10-21 – 2023-11-09 (×16): 25 mg via ORAL
  Filled 2023-10-21 (×20): qty 1

## 2023-10-21 MED ORDER — CLOPIDOGREL BISULFATE 75 MG PO TABS
75.0000 mg | ORAL_TABLET | Freq: Every day | ORAL | Status: DC
  Administered 2023-10-21 – 2023-11-06 (×17): 75 mg via ORAL
  Filled 2023-10-21 (×17): qty 1

## 2023-10-21 NOTE — Progress Notes (Signed)
  Inpatient Rehab Admissions Coordinator :  Per therapy recommendations, patient was screened for CIR candidacy by Ottie Glazier RN MSN.  At this time patient appears to be a potential candidate for CIR. I will place a rehab consult per protocol for full assessment. Please call me with any questions.  Ottie Glazier RN MSN Admissions Coordinator [phone]

## 2023-10-21 NOTE — Progress Notes (Signed)
 Physical Therapy Treatment Patient Details Name: Anita Mccormick MRN: [id number] DOB: 1946/07/16 Today's Date: 10/21/2023   History of Present Illness Pt is 78 yo presenting to [facility] ED on 3/18 due to worsening shortness of breathe, orthopnea and wheezing with residual cough since COVID. Pt had NSTEMI while in ED with plan for LHC prior to discharge. PMH: HTN, HLD, CVA, DM, CKD II, HFEF, anemia, breast cancer, NAFLD/cirrhosis and COVID 08/2023   PT Comments  Pt greeted supine in bed, requesting to use the bathroom. She required CGA throughout session. Pt ambulated ~67ft using RW with a shuffle gait pattern. She required VC to help navigate the tight environment. Pt demonstrated reduced cardiopulmonary endurance. She reported a 9/10 on the modified RPE scale following her trip to the bathroom. Patient will benefit from continued inpatient follow up therapy, <3 hours/day.    If plan is discharge home, recommend the following: A little help with walking and/or transfers;Assist for transportation;Help with stairs or ramp for entrance;Assistance with cooking/housework;A little help with bathing/dressing/bathroom   Can travel by private vehicle     Yes  Equipment Recommendations  Rolling walker (2 wheels);BSC/3in1;Wheelchair (measurements PT);Wheelchair cushion (measurements PT)    Recommendations for Other Services       Precautions / Restrictions Precautions Precautions: Fall Recall of Precautions/Restrictions: Intact Restrictions Weight Bearing Restrictions Per Provider Order: No     Mobility  Bed Mobility Overal bed mobility: Needs Assistance Bed Mobility: Supine to Sit     Supine to sit: HOB elevated, Contact guard     General bed mobility comments: Pt sat up on R side of bed. She pulled on PT in order to obtain upright posture and scooted fwd til feet supported with CGA.    Transfers Overall transfer level: Needs assistance Equipment used: Rolling walker (2 wheels) Transfers:  Sit to/from Stand, Bed to chair/wheelchair/BSC Sit to Stand: Contact guard assist   Step pivot transfers: Contact guard assist       General transfer comment: Pt stood from lowest bed height with CGA to power up into standing. Good eccentric control with sitting. Pt stood from commode with supervision and pulling on grab bar. Pt transferred from commode to recliner chair on left with short slow steps.    Ambulation/Gait Ambulation/Gait assistance: Contact guard assist, +2 safety/equipment (Chair Follow) Gait Distance (Feet): 10 Feet (1x10, seated on commode, 1x5) Assistive device: Rolling walker (2 wheels) Gait Pattern/deviations: Step-to pattern, Decreased step length - right, Decreased step length - left, Shuffle, Trunk flexed, Wide base of support Gait velocity: decreased Gait velocity interpretation: <1.31 ft/sec, indicative of household ambulator   General Gait Details: Pt ambulated with small, slow, short steps. She had no foot clearence, fwd flex posture over RW, and WBOS. Pt required VC to help navigate tight spaces in room/bathroom.   Stairs             Wheelchair Mobility     Tilt Bed    Modified Rankin (Stroke Patients Only)       Balance Overall balance assessment: Needs assistance Sitting-balance support: Feet supported, No upper extremity supported Sitting balance-Leahy Scale: Good Sitting balance - Comments: Pt sat EOB with supervision. She attempted to address pericare in sitting, reaching outside her BOS.   Standing balance support: Single extremity supported, Bilateral upper extremity supported, During functional activity, Reliant on assistive device for balance Standing balance-Leahy Scale: Fair Standing balance comment: Pt stood to further address pericare. PT took over to finish hygiene as pt fatigues. She maintained static  stance with BUE support on RW. She relies on RW during transfers and gait.                             Communication Communication Communication: Impaired Factors Affecting Communication: Reduced clarity of speech (Pt is soft spoken)  Cognition Arousal: Alert Behavior During Therapy: WFL for tasks assessed/performed   PT - Cognitive impairments: No apparent impairments                         Following commands: Intact      Cueing Cueing Techniques: Verbal cues  Exercises      General Comments General comments (skin integrity, edema, etc.): VSS on RA.      Pertinent Vitals/Pain Pain Assessment Pain Assessment: No/denies pain    Home Living                          Prior Function            PT Goals (current goals can now be found in the care plan section) Acute Rehab PT Goals Patient Stated Goal: Be less tired and move easier. Progress towards PT goals: Progressing toward goals    Frequency    Min 1X/week      PT Plan      Co-evaluation              AM-PAC PT "6 Clicks" Mobility   Outcome Measure  Help needed turning from your back to your side while in a flat bed without using bedrails?: A Little Help needed moving from lying on your back to sitting on the side of a flat bed without using bedrails?: A Little Help needed moving to and from a bed to a chair (including a wheelchair)?: A Little Help needed standing up from a chair using your arms (e.g., wheelchair or bedside chair)?: A Little Help needed to walk in hospital room?: Total Help needed climbing 3-5 steps with a railing? : Total 6 Click Score: 14    End of Session Equipment Utilized During Treatment: Gait belt Activity Tolerance: Patient limited by fatigue Patient left: in chair;with chair alarm set;with call bell/phone within reach;with family/visitor present Nurse Communication: Mobility status PT Visit Diagnosis: Unsteadiness on feet (R26.81);Other abnormalities of gait and mobility (R26.89)     Time: 4696-2952 PT Time Calculation (min) (ACUTE ONLY): 20  min  Charges:    $Therapeutic Activity: 8-22 mins PT General Charges $$ ACUTE PT VISIT: 1 Visit                     Cheri Guppy, PT, DPT Acute Rehabilitation Services Office: [phone] Secure Chat Preferred  Richardson Chiquito 10/21/2023, 4:44 PM

## 2023-10-21 NOTE — TOC Progression Note (Signed)
 Transition of Care [facility]) - Progression Note    Patient Details  Name: Anita Mccormick MRN: [id number] Date of Birth: 1946/07/13  Transition [facility] Care [facility]) CM/SW Contact  Leone Haven, RN Phone Number: 10/21/2023, 1:30 PM  Clinical Narrative:    Await pt re-eval.  Patient needing a lot of help with mobility.    Expected Discharge Plan:  (Pending PT evaluation) Barriers to Discharge: Continued Medical Work up  Expected Discharge Plan and Services   Discharge Planning Services: CM Consult Post Acute Care Choice: Skilled Nursing Facility Living arrangements for the past 2 months: Single Family Home                                       Social Determinants of Health (SDOH) Interventions SDOH Screenings   Food Insecurity: No Food Insecurity (10/13/2023)  Housing: Low Risk  (10/13/2023)  Transportation Needs: No Transportation Needs (10/13/2023)  Utilities: Not At Risk (10/13/2023)  Alcohol Screen: Low Risk  (10/03/2022)  Depression (PHQ2-9): Low Risk  (07/06/2023)  Financial Resource Strain: Low Risk  (10/03/2022)  Physical Activity: Inactive (10/03/2022)  Social Connections: Moderately Isolated (10/13/2023)  Stress: No Stress Concern Present (10/03/2022)  Tobacco Use: High Risk (10/13/2023)    Readmission Risk Interventions    10/19/2023   10:43 AM  Readmission Risk Prevention Plan  Post Dischage Appt Complete  Medication Screening Complete  Transportation Screening Complete

## 2023-10-21 NOTE — Consult Note (Signed)
 [facility] Liaison Consult Note   10/21/2023  Anita Mccormick 11/27/1945 [id number]  Insurance: [employer]  Primary Care Provider: Deeann Saint, MD, with [facility], this provider is listed for the transition of care follow up appointments  and [facility] calls   [facility] Liaison screened the patient remotely at [facility].  Patient on droplet precautions, PPE reserved for hospital staff.  Family at bedside on rounds.   The patient was screened for LLOS 8 days including a ICU stay hospitalization with noted medium risk score for unplanned readmission risk 1 hospital admissions in 6 months.  The patient was assessed for potential [facility] Coordination service needs for post hospital transition for care coordination. Review of patient's electronic medical record reveals patient is being considered for SNF vs CIR due to concerns for additional needs.  Review today reveal patient is awaiting PT to follow up after treatment [iron infusion] noted.  Plan: [facility] Liaison will continue to follow progress and disposition to assess for post hospital community care coordination/management needs.  Referral request for community care coordination: Following as disposition is currently unclear.   [facility]BCI Community Care, Population Health does not replace or interfere with any arrangements made by the Inpatient Transition of Care team.   For questions contact:   Charlesetta Shanks, RN, BSN, CCM [facility] Liaison Direct Dial: [phone] or secure chat Email: [email or web address]

## 2023-10-21 NOTE — Progress Notes (Addendum)
 Patient Name: Anita Mccormick Date of Encounter: 10/21/2023 [facility] Cardiologist: Peter Swaziland, MD   Interval Summary  .    Today, she reports feeling ok. She denies chest pain and continue to have intermittent dyspnea. We discussed the LHC results.   Vital Signs .    Vitals:   10/20/23 1955 10/21/23 0014 10/21/23 0600 10/21/23 0722  BP: (!) 159/65 (!) 142/58 (!) 156/57 (!) 154/57  Pulse: 92 87 93 87  Resp: 20 (!) 21 18 20   Temp: 98.9 F (37.2 C) 99.3 F (37.4 C) 99.5 F (37.5 C) 99.1 F (37.3 C)  TempSrc: Oral Oral Oral Oral  SpO2: 99% 93% 93% 90%  Weight:   66.3 kg   Height:        Intake/Output Summary (Last 24 hours) at 10/21/2023 0749 Last data filed at 10/21/2023 0130 Gross per 24 hour  Intake 480 ml  Output --  Net 480 ml      10/21/2023    6:00 AM 10/20/2023    1:41 PM 10/13/2023    8:15 AM  Last 3 Weights  Weight (lbs) 146 lb 1.6 oz 141 lb 1.5 oz 147 lb 11.3 oz  Weight (kg) 66.271 kg 64 kg 67 kg      Telemetry/ECG    NSR with occasional PVCs - Personally Reviewed  LHC findings: -Left Anterior Descending -Prox LAD lesion is 70% stenosed. The lesion is severely calcified. -Ramus Intermedius -Ramus lesion is 75% stenosed. -Left Circumflex -Ost Cx to Prox Cx lesion is 100% stenosed. -Right Coronary Artery -Vessel is small. -Ost RCA to Prox RCA lesion is 90% stenosed. -Prox RCA to Mid RCA lesion is 80% stenosed. -Right Ventricular Branch -Vessel is small in size. -RV Branch lesion is 80% stenosed.   Physical Exam .   GEN: No acute distress.   Cardiac: RRR, no murmurs, rubs, or gallops.  Respiratory: Clear to auscultation bilaterally. GI: Soft, non-distended   Assessment & Plan .     #NSTEMI #CAD Diagnostic LHC revealed CAD of multiple native vessels with 100% stenosed of L circumflex. No interventions performed during catheterization. With patient's underlying co-morbidities, she is not a candidate for CABG and PCI. Will treat  her medically with ASA, Lipitor 80mg . Will start Plavix for DAPT for 1 year. There is room with blood pressure to add an antianginal such as metoprolol.   #Cardiac Arrest, PEA arrest 2/2 hypoxic respiratory failure #Influenza A  Patient is stable on room air   #Pneumothorax s/p chest tube removal Stable   Cardiology will sign off:  For questions or updates, please contact [facility] Please consult [email or web address] for contact info under        Signed, Faith Rogue, DO    Patient seen, examined. Available data reviewed. Agree with findings, assessment, and plan as outlined by Faith Rogue, DO.  On exam, the patient is alert, elderly woman in no distress.  JVP is normal, lungs are clear bilaterally, heart is regular rate and rhythm no murmur gallop, right radial site is clear with no hematoma or ecchymosis, abdomen soft nontender, extremities have no edema.  I personally reviewed the patient's cardiac catheterization films and she has multivessel CAD with total occlusion of her circumflex and severe stenosis of a small RCA branch.  Her CAD is appropriate for medical therapy as she is having no anginal symptoms.  She does not have any severe disease in the left main or LAD.  Her LVEF is normal.  Recommend medical therapy  with clopidogrel and aspirin to be continued for 12 months for medical treatment of non-STEMI.  Recommend addition of low-dose metoprolol succinate and continuation of atorvastatin 80 mg daily.  The patient is otherwise stable from a cardiac perspective and can be discharged home when she is otherwise medically ready for discharge.  We will arrange outpatient follow-up.  Please call if any questions arise.  Tonny Bollman, M.D. 10/21/2023 10:36 AM

## 2023-10-21 NOTE — Progress Notes (Signed)
 PROGRESS NOTE    Anita Mccormick  ZOX:[id number] DOB: 1946-01-01 DOA: 10/13/2023 PCP: Deeann Saint, MD   Brief Narrative:  78 year old female with history of tobacco abuse, hypertension, hyperlipidemia, unspecified CVA, diabetes mellitus type 2, CKD stage II, chronic diastolic heart failure, anemia, breast cancer, NAFLD/cirrhosis and COVID-19 infection in 08/2023 presented with worsening shortness of breath.  On presentation, she required supplemental oxygen and was found to be influenza A positive; chest x-ray was negative for acute infiltrates.  She was started on Tamiflu.  Subsequently, had a syncopal event and was found to be more hypoxic.  Later on, she was found to be in PEA bradycardic/asystolic arrest with ROSC after 3 rounds of ACLS.  She was subsequently intubated and transferred to ICU.  PCCM and cardiology were consulted.  She was then found to have left pneumothorax for which pigtail catheter was placed.  She also needed vasopressors for shock along with IV antibiotics.  Troponins trended to more than 24,000.  She was started on heparin drip.  Echo showed EF of 60 to 65% with no valvular issues.  She was also started on IV Solu-Medrol by PCCM.  Subsequently, vasopressors were stopped.  She was extubated on 10/17/2023.Chest tube was subsequently removed.  She was transferred to [facility] service from 10/18/2023 onwards.  Cardiology planning for cardiac catheterization today.  Assessment & Plan:   PEA cardiac arrest Non-STEMI History of chronic diastolic heart failure Probable cardiogenic shock: Resolved Coronary artery disease -With ROSC after 3 rounds of CPR. -Required ICU care with vasopressors/ventilatory support/broad-spectrum antibiotics -Subsequently extubated and weaned off vasopressors.  Transferred to [facility] service from 10/18/2023 onwards -Troponins trended to more than 24,000.  Treated with heparin drip and subsequently discontinued.  Echo showed EF of 60 to 65% with no valvular  issues.  Patient was seen by cardiology.  Underwent cardiac catheterization on 3/25.  Triple-vessel disease was noted but none of them amenable to PCI.  Not thought to be a good candidate for bypass surgery.  Medical management is being pursued. She is noted to be on aspirin, amlodipine, statin. Await further cardiology input today.  Influenza A pneumonia COPD exacerbation Acute respiratory failure with hypoxia -Treated with Tamiflu, steroids for 5 days.  Also completed 5 days of Rocephin.  Continue current nebs.  Add DuoNeb as needed. Respiratory status is stable. Has been weaned off of oxygen.  Saturations are in the early 90s on room air.  Continue to monitor.  Pneumothorax -Required pigtail catheter placement which has subsequently been removed.  History of hypertension Noted to be on amlodipine.  Monitor blood pressures closely.  Occasional high readings noted.  Syncope -During the hospitalization.  Echo and cardiac catheterization as above.  Telemetry with no abnormalities.  Do not anticipate any further workup.  Thrombocytopenia -Questionable cause.  Monitor.  No signs of bleeding Counts are improving.  Leukocytosis -Resolved  Macrocytic anemia Possible iron deficiency anemia. -B12 and folate normal. -IV iron was ordered.  History of unspecified CVA Hyperlipidemia -No deficits.  Continue aspirin and statin  Diabetes mellitus type 2 with hyperglycemia -Continue CBGs with SSI.  A1c 5  NAFLD/cirrhosis -Appears compensated.  Outpatient follow-up with GI  History of breast cancer -Outpatient follow-up with oncology  Physical deconditioning -PT following and recommending home health PT  DVT prophylaxis: Heparin subcutaneous Code Status: Full Family Communication: Husband and daughter at bedside Disposition Plan: Home with home health.    Consultants: PCCM/cardiology  Procedures: As above  Antimicrobials:  Anti-infectives (From admission, onward)  Start     Dose/Rate Route Frequency Ordered Stop   10/17/23 1000  oseltamivir (TAMIFLU) capsule 30 mg        30 mg Oral 2 times daily 10/17/23 0728 10/18/23 2109   10/15/23 1000  oseltamivir (TAMIFLU) capsule 30 mg  Status:  Discontinued        30 mg Per Tube 2 times daily 10/15/23 0735 10/17/23 0729   10/14/23 1200  metroNIDAZOLE (FLAGYL) IVPB 500 mg  Status:  Discontinued        500 mg 100 mL/hr over 60 Minutes Intravenous Every 12 hours 10/13/23 2357 10/14/23 0935   10/14/23 1030  azithromycin (ZITHROMAX) 500 mg in sodium chloride 0.9 % 250 mL IVPB        500 mg 250 mL/hr over 60 Minutes Intravenous Every 24 hours 10/14/23 0935 10/16/23 1038   10/14/23 1000  oseltamivir (TAMIFLU) capsule 30 mg  Status:  Discontinued        30 mg Per Tube Daily 10/14/23 0935 10/15/23 0735   10/14/23 0000  cefTRIAXone (ROCEPHIN) 2 g in sodium chloride 0.9 % 100 mL IVPB        2 g 200 mL/hr over 30 Minutes Intravenous Every 24 hours 10/13/23 2357 10/17/23 2339   10/13/23 2200  oseltamivir (TAMIFLU) capsule 30 mg  Status:  Discontinued        30 mg Oral 2 times daily 10/13/23 1247 10/13/23 1847   10/13/23 2015  oseltamivir (TAMIFLU) capsule 30 mg  Status:  Discontinued        30 mg Per Tube Daily 10/13/23 1847 10/14/23 0935   10/13/23 1145  oseltamivir (TAMIFLU) capsule 75 mg        75 mg Oral  Once 10/13/23 1133 10/13/23 1141        Subjective: Patient lying comfortably on the bed.  Denies any chest pain or shortness of breath this morning.  Overall she mentions that she feels better.  Denies any nausea or vomiting.  Objective: Vitals:   10/20/23 1955 10/21/23 0014 10/21/23 0600 10/21/23 0722  BP: (!) 159/65 (!) 142/58 (!) 156/57 (!) 154/57  Pulse: 92 87 93 87  Resp: 20 (!) 21 18 20   Temp: 98.9 F (37.2 C) 99.3 F (37.4 C) 99.5 F (37.5 C) 99.1 F (37.3 C)  TempSrc: Oral Oral Oral Oral  SpO2: 99% 93% 93% 90%  Weight:   66.3 kg   Height:        Intake/Output Summary (Last 24 hours) at  10/21/2023 0908 Last data filed at 10/21/2023 0130 Gross per 24 hour  Intake 480 ml  Output --  Net 480 ml   Filed Weights   10/13/23 0815 10/20/23 1341 10/21/23 0600  Weight: 67 kg 64 kg 66.3 kg    Examination:  General appearance: Awake alert.  In no distress Resp: Clear to auscultation bilaterally.  Normal effort Cardio: S1-S2 is normal regular.  No S3-S4.  No rubs murmurs or bruit GI: Abdomen is soft.  Nontender nondistended.  Bowel sounds are present normal.  No masses organomegaly No obvious focal neurological deficits.   Data Reviewed: I have personally reviewed following labs and imaging studies  CBC: Recent Labs  Lab 10/17/23 0824 10/18/23 0834 10/19/23 1043 10/20/23 0621 10/21/23 0253  WBC 9.4 7.8 8.3 4.7 5.5  NEUTROABS  --  6.7 6.9  --  4.4  HGB 9.8* 9.3* 11.0* 9.1* 8.6*  HCT 30.2* 28.6* 34.4* 27.5* 26.2*  MCV 110.2* 107.5* 108.5* 105.0* 104.8*  PLT 84* 76*  106* 111* 124*   Basic Metabolic Panel: Recent Labs  Lab 10/15/23 0830 10/16/23 0345 10/18/23 0834 10/19/23 1043 10/20/23 0621 10/21/23 0253  NA  --  140 141 141 139 140  K  --  4.4 4.7 4.6 3.6 3.5  CL  --  108 108 109 107 108  CO2  --  23 21* 22 23 24   GLUCOSE  --  254* 229* 186* 165* 134*  BUN  --  26* 15 21 17 12   CREATININE  --  1.16* 1.19* 0.94 0.94 0.93  CALCIUM  --  8.4* 8.9 9.3 8.6* 8.1*  MG 2.7*  --   --   --   --  1.9   GFR: Estimated Creatinine Clearance: 46 mL/min (by C-G formula based on SCr of 0.93 mg/dL).  CBG: Recent Labs  Lab 10/19/23 2028 10/20/23 0839 10/20/23 1522 10/20/23 2032 10/21/23 0525  GLUCAP 148* 177* 258* 135* 133*     Sepsis Labs: Recent Labs  Lab 10/14/23 1105 10/14/23 1400 10/15/23 1122  PROCALCITON  --   --  1.35  LATICACIDVEN 3.2* 2.2*  --     Recent Results (from the past 240 hours)  Resp panel by RT-PCR (RSV, Flu A&B, Covid) Anterior Nasal Swab     Status: Abnormal   Collection Time: 10/13/23  8:31 AM   Specimen: Anterior Nasal Swab   Result Value Ref Range Status   SARS Coronavirus 2 by RT PCR NEGATIVE NEGATIVE Final   Influenza A by PCR POSITIVE (A) NEGATIVE Final   Influenza B by PCR NEGATIVE NEGATIVE Final    Comment: (NOTE) The Xpert Xpress SARS-CoV-2/FLU/RSV plus assay is intended as an aid in the diagnosis of influenza from Nasopharyngeal swab specimens and should not be used as a sole basis for treatment. Nasal washings and aspirates are unacceptable for Xpert Xpress SARS-CoV-2/FLU/RSV testing.  Fact Sheet for Patients: BloggerCourse.com  Fact Sheet for Healthcare Providers: SeriousBroker.it  This test is not yet approved or cleared by the [place] FDA and has been authorized for detection and/or diagnosis of SARS-CoV-2 by FDA under an Emergency Use Authorization (EUA). This EUA will remain in effect (meaning this test can be used) for the duration of the COVID-19 declaration under Section 564(b)(1) of the Act, 21 U.S.C. section 360bbb-3(b)(1), unless the authorization is terminated or revoked.     Resp Syncytial Virus by PCR NEGATIVE NEGATIVE Final    Comment: (NOTE) Fact Sheet for Patients: BloggerCourse.com  Fact Sheet for Healthcare Providers: SeriousBroker.it  This test is not yet approved or cleared by the [place] FDA and has been authorized for detection and/or diagnosis of SARS-CoV-2 by FDA under an Emergency Use Authorization (EUA). This EUA will remain in effect (meaning this test can be used) for the duration of the COVID-19 declaration under Section 564(b)(1) of the Act, 21 U.S.C. section 360bbb-3(b)(1), unless the authorization is terminated or revoked.  Performed at [facility], [address] [place], [place] [address]   MRSA Next Gen by PCR, Nasal     Status: None   Collection Time: 10/13/23  7:53 PM   Specimen: Nasal Mucosa; Nasal Swab  Result Value Ref  Range Status   MRSA by PCR Next Gen NOT DETECTED NOT DETECTED Final    Comment: (NOTE) The GeneXpert MRSA Assay (FDA approved for NASAL specimens only), is one component of a comprehensive MRSA colonization surveillance program. It is not intended to diagnose MRSA infection nor to guide or monitor treatment for MRSA infections.  Test performance is not FDA approved in patients less than 64 years old. Performed at [facility], [address], [place], [place] [address]          Radiology Studies: CARDIAC CATHETERIZATION Result Date: 10/20/2023 3 vessel obstructive CAD. Suspect culprit vessel is the LCx. Low LVEDP 4 mm Hg Plan: recommend medical therapy. She is not having significant angina. She is currently a poor candidate for CABG. Poor targets for PCI.    Scheduled Meds:  amLODipine  5 mg Oral Daily   aspirin EC  81 mg Oral Daily   atorvastatin  80 mg Oral Daily   budesonide (PULMICORT) nebulizer solution  0.5 mg Nebulization BID   dextromethorphan-guaiFENesin  1 tablet Oral BID   feeding supplement  237 mL Oral BID BM   heparin  5,000 Units Subcutaneous Q8H   insulin aspart  0-15 Units Subcutaneous TID WC   insulin aspart  0-5 Units Subcutaneous QHS   multivitamin with minerals  1 tablet Oral Daily   sodium chloride flush  3 mL Intravenous Q12H   sodium chloride flush  3 mL Intravenous Q12H   Continuous Infusions:  sodium chloride     iron sucrose 300 mg (10/20/23 1738)     Osvaldo Shipper, MD [facility] 10/21/2023, 9:08 AM

## 2023-10-21 NOTE — Telephone Encounter (Signed)
 Copied from CRM [id number]. Topic: Clinical - Medical Advice >> Oct 21, 2023  2:04 PM Adaysia C wrote: Reason for CRM: Patients daughter(Sheronda) called to inform the provider that the patient is currently in the hospital due to a heart attack on 10/13/2023; patients daughter has requested a call back from the provider or providers nurse to discuss concerns about the patients current health status as soon as possible; please follow up with patients daughter(Sheronda)#[phone]

## 2023-10-21 NOTE — Progress Notes (Signed)
 PT Cancellation Note  Patient Details Name: Anita Mccormick MRN: [id number] DOB: 1945-09-24   Cancelled Treatment:    Reason Eval/Treat Not Completed: Fatigue/lethargy limiting ability to participate;Medical issues which prohibited therapy (RN requested PT hold session at this time as pt is about to recieve an iron treatment. Will follow-up this afternoon as schedule permits.)  Cheri Guppy, PT, DPT Acute Rehabilitation Services Office: [phone] Secure Chat Preferred  Richardson Chiquito 10/21/2023, 11:12 AM

## 2023-10-22 DIAGNOSIS — I10 Essential (primary) hypertension: Secondary | ICD-10-CM | POA: Diagnosis not present

## 2023-10-22 DIAGNOSIS — N182 Chronic kidney disease, stage 2 (mild): Secondary | ICD-10-CM | POA: Diagnosis not present

## 2023-10-22 DIAGNOSIS — E1129 Type 2 diabetes mellitus with other diabetic kidney complication: Secondary | ICD-10-CM | POA: Diagnosis not present

## 2023-10-22 LAB — LIPOPROTEIN A (LPA): Lipoprotein (a): 43.8 nmol/L — ABNORMAL HIGH (ref <75.0)

## 2023-10-22 LAB — GLUCOSE, CAPILLARY
Glucose-Capillary: 131 mg/dL — ABNORMAL HIGH (ref 70–99)
Glucose-Capillary: 194 mg/dL — ABNORMAL HIGH (ref 70–99)
Glucose-Capillary: 180 mg/dL — ABNORMAL HIGH (ref 70–99)
Glucose-Capillary: 129 mg/dL — ABNORMAL HIGH (ref 70–99)

## 2023-10-22 NOTE — Care Management Important Message (Signed)
 Important Message  Patient Details  Name: Anita Mccormick MRN: [id number] Date of Birth: [birth year]   Important Message Given:  Yes - Medicare IM     Renie Ora 10/22/2023, 8:26 AM

## 2023-10-22 NOTE — Telephone Encounter (Signed)
 Pt's daughter Lynnell Catalan called, she wanted to up date this provider on her condition.  Pt went to ED due to SOB, found to have influenza A.  Had syncopal event in ED, PEA, CPR, rib fx, pneumothorax, NSTEMI, went to Cath lab. Pt doing better all things considered.  Iron restarted.  Pt's husband, Lollie Sails (also seen by this provider), is hanging in there.  Strongly considering inpt rehab.   Lorain Childes Lynnell Catalan will be [place] of [place] this wknd, but available by phone if needed. [phone]).

## 2023-10-22 NOTE — Plan of Care (Signed)
  Problem: Respiratory: Goal: Ability to maintain a clear airway and adequate ventilation will improve Outcome: Progressing   Problem: Education: Goal: Knowledge of General Education information will improve Description: Including pain rating scale, medication(s)/side effects and non-pharmacologic comfort measures Outcome: Progressing   Problem: Pain Managment: Goal: General experience of comfort will improve and/or be controlled Outcome: Progressing   Problem: Coping: Goal: Ability to adjust to condition or change in health will improve Outcome: Not Progressing   Problem: Health Behavior/Discharge Planning: Goal: Ability to manage health-related needs will improve Outcome: Not Progressing   Problem: Activity: Goal: Ability to tolerate increased activity will improve Outcome: Not Progressing   Problem: Clinical Measurements: Goal: Will remain free from infection Outcome: Not Progressing

## 2023-10-22 NOTE — Progress Notes (Signed)
 1. Failure to attend appointment with reason given   Recent hospitalization.

## 2023-10-22 NOTE — Progress Notes (Signed)
 IP rehab admissions - I met with patient and her husband.  I explained inpatient rehab and gave them rehab booklets to review.  They would like rehab here in the hospital.  We are opening the case with insurance carrier today and will update all once we hear back from insurance case manager.  [phone]

## 2023-10-22 NOTE — TOC Progression Note (Signed)
 Transition of Care [facility]) - Progression Note    Patient Details  Name: Anita Mccormick MRN: [id number] Date of Birth: 1945/08/25  [facility] CM/SW Contact  Michaela Corner, [place] Phone Number: 10/22/2023, 10:59 AM  Clinical Narrative:  CSW met patient and family at bedside introducing self/role. CSW discussed PT recs for SNF. Patients dtr would like CSW to submit referrals for SNF at this time. Lynnell Catalan, pts dtr, asked for CSW to email bed offers to [email or web address]. CIR is following patient at this time.   TOC will continue to follow.    Expected Discharge Plan:  (Pending PT evaluation) Barriers to Discharge: Continued Medical Work up  Expected Discharge Plan and Services   Discharge Planning Services: CM Consult Post Acute Care Choice: Skilled Nursing Facility Living arrangements for the past 2 months: Single Family Home                                       Social Determinants of Health (SDOH) Interventions SDOH Screenings   Food Insecurity: No Food Insecurity (10/13/2023)  Housing: Low Risk  (10/13/2023)  Transportation Needs: No Transportation Needs (10/13/2023)  Utilities: Not At Risk (10/13/2023)  Alcohol Screen: Low Risk  (10/03/2022)  Depression (PHQ2-9): Low Risk  (07/06/2023)  Financial Resource Strain: Low Risk  (10/03/2022)  Physical Activity: Inactive (10/03/2022)  Social Connections: Moderately Isolated (10/13/2023)  Stress: No Stress Concern Present (10/03/2022)  Tobacco Use: High Risk (10/13/2023)    Readmission Risk Interventions    10/19/2023   10:43 AM  Readmission Risk Prevention Plan  Post Dischage Appt Complete  Medication Screening Complete  Transportation Screening Complete

## 2023-10-22 NOTE — Progress Notes (Signed)
 PROGRESS NOTE    Anita Mccormick  ZOX:[id number] DOB: 1946/03/29 DOA: 10/13/2023 PCP: Deeann Saint, MD   Brief Narrative:  78 year old female with history of tobacco abuse, hypertension, hyperlipidemia, unspecified CVA, diabetes mellitus type 2, CKD stage II, chronic diastolic heart failure, anemia, breast cancer, NAFLD/cirrhosis and COVID-19 infection in 08/2023 presented with worsening shortness of breath.  On presentation, she required supplemental oxygen and was found to be influenza A positive; chest x-ray was negative for acute infiltrates.  She was started on Tamiflu.  Subsequently, had a syncopal event and was found to be more hypoxic.  Later on, she was found to be in PEA bradycardic/asystolic arrest with ROSC after 3 rounds of ACLS.  She was subsequently intubated and transferred to ICU.  PCCM and cardiology were consulted.  She was then found to have left pneumothorax for which pigtail catheter was placed.  She also needed vasopressors for shock along with IV antibiotics.  Troponins trended to more than 24,000.  She was started on heparin drip.  Echo showed EF of 60 to 65% with no valvular issues.  She was also started on IV Solu-Medrol by PCCM.  Subsequently, vasopressors were stopped.  She was extubated on 10/17/2023.Chest tube was subsequently removed.  She was transferred to [facility] service from 10/18/2023 onwards.  Cardiology planning for cardiac catheterization today.  Assessment & Plan:   PEA cardiac arrest Non-STEMI History of chronic diastolic heart failure Probable cardiogenic shock: Resolved Coronary artery disease -With ROSC after 3 rounds of CPR. -Required ICU care with vasopressors/ventilatory support/broad-spectrum antibiotics -Subsequently extubated and weaned off vasopressors.  Transferred to [facility] service from 10/18/2023 onwards -Troponins trended to more than 24,000.  Treated with heparin drip and subsequently discontinued.  Echo showed EF of 60 to 65% with no valvular  issues.  Patient was seen by cardiology.  Underwent cardiac catheterization on 3/25.  Triple-vessel disease was noted but none of them amenable to PCI.  Not thought to be a good candidate for bypass surgery.  Medical management is being pursued. She is noted to be on aspirin, amlodipine, statin. No further cardiac workup at this time.  Cardiology has signed off.  Influenza A pneumonia COPD exacerbation Acute respiratory failure with hypoxia -Treated with Tamiflu, steroids for 5 days.  Also completed 5 days of Rocephin.  Continue current nebs.  Add DuoNeb as needed. Respiratory status is stable. Has been weaned off of oxygen.  Saturations are in the early 90s on room air.  Continue to monitor.  Pneumothorax -Required pigtail catheter placement which has subsequently been removed.  History of hypertension Noted to be on amlodipine.  Monitor blood pressures closely.  Occasional high readings noted.  Syncope -During the hospitalization.  Echo and cardiac catheterization as above.  Telemetry with no abnormalities.  Has completed workup.  Thrombocytopenia -Questionable cause.  Monitor.  No signs of bleeding Counts are improving.  Leukocytosis -Resolved  Macrocytic anemia Possible iron deficiency anemia. -B12 and folate normal. -IV iron was ordered.  History of unspecified CVA Hyperlipidemia -No deficits.  Continue aspirin and statin  Diabetes mellitus type 2 with hyperglycemia -Continue CBGs with SSI.  A1c 5  NAFLD/cirrhosis -Appears compensated.  Outpatient follow-up with GI  History of breast cancer -Outpatient follow-up with oncology  Physical deconditioning -PT following and recommending home health PT  DVT prophylaxis: Heparin subcutaneous Code Status: Full Family Communication: Son is at the bedside Disposition Plan: CIR versus SNF    Consultants: PCCM/cardiology  Procedures: As above   Subjective: Patient complains  of soreness all over.  No new  complaints offered.  Soreness in the chest is probably from recent CPR.  Tolerating her diet.  Agreeable to rehab.  Son is at the bedside.    Objective: Vitals:   10/22/23 0429 10/22/23 0430 10/22/23 0431 10/22/23 0751  BP:      Pulse:      Resp: (!) 22 19 (!) 23   Temp:      TempSrc:      SpO2:    95%  Weight:      Height:        Intake/Output Summary (Last 24 hours) at 10/22/2023 0832 Last data filed at 10/21/2023 1832 Gross per 24 hour  Intake 120 ml  Output --  Net 120 ml   Filed Weights   10/20/23 1341 10/21/23 0600 10/22/23 0427  Weight: 64 kg 66.3 kg 68.3 kg    Examination:  General appearance: Awake alert.  In no distress Resp: Clear to auscultation bilaterally.  Normal effort Cardio: S1-S2 is normal regular.  No S3-S4.  No rubs murmurs or bruit GI: Abdomen is soft.  Nontender nondistended.  Bowel sounds are present normal.  No masses organomegaly    Data Reviewed:   CBC: Recent Labs  Lab 10/17/23 0824 10/18/23 0834 10/19/23 1043 10/20/23 0621 10/21/23 0253  WBC 9.4 7.8 8.3 4.7 5.5  NEUTROABS  --  6.7 6.9  --  4.4  HGB 9.8* 9.3* 11.0* 9.1* 8.6*  HCT 30.2* 28.6* 34.4* 27.5* 26.2*  MCV 110.2* 107.5* 108.5* 105.0* 104.8*  PLT 84* 76* 106* 111* 124*   Basic Metabolic Panel: Recent Labs  Lab 10/16/23 0345 10/18/23 0834 10/19/23 1043 10/20/23 0621 10/21/23 0253  NA 140 141 141 139 140  K 4.4 4.7 4.6 3.6 3.5  CL 108 108 109 107 108  CO2 23 21* 22 23 24   GLUCOSE 254* 229* 186* 165* 134*  BUN 26* 15 21 17 12   CREATININE 1.16* 1.19* 0.94 0.94 0.93  CALCIUM 8.4* 8.9 9.3 8.6* 8.1*  MG  --   --   --   --  1.9   GFR: Estimated Creatinine Clearance: 46.6 mL/min (by C-G formula based on SCr of 0.93 mg/dL).  CBG: Recent Labs  Lab 10/21/23 0525 10/21/23 1100 10/21/23 1559 10/21/23 2102 10/22/23 0602  GLUCAP 133* 175* 125* 131* 131*     Sepsis Labs: Recent Labs  Lab 10/15/23 1122  PROCALCITON 1.35    Recent Results (from the past 240  hours)  Resp panel by RT-PCR (RSV, Flu A&B, Covid) Anterior Nasal Swab     Status: Abnormal   Collection Time: 10/13/23  8:31 AM   Specimen: Anterior Nasal Swab  Result Value Ref Range Status   SARS Coronavirus 2 by RT PCR NEGATIVE NEGATIVE Final   Influenza A by PCR POSITIVE (A) NEGATIVE Final   Influenza B by PCR NEGATIVE NEGATIVE Final    Comment: (NOTE) The Xpert Xpress SARS-CoV-2/FLU/RSV plus assay is intended as an aid in the diagnosis of influenza from Nasopharyngeal swab specimens and should not be used as a sole basis for treatment. Nasal washings and aspirates are unacceptable for Xpert Xpress SARS-CoV-2/FLU/RSV testing.  Fact Sheet for Patients: BloggerCourse.com  Fact Sheet for Healthcare Providers: SeriousBroker.it  This test is not yet approved or cleared by the [place] FDA and has been authorized for detection and/or diagnosis of SARS-CoV-2 by FDA under an Emergency Use Authorization (EUA). This EUA will remain in effect (meaning this test can be used) for  the duration of the COVID-19 declaration under Section 564(b)(1) of the Act, 21 U.S.C. section 360bbb-3(b)(1), unless the authorization is terminated or revoked.     Resp Syncytial Virus by PCR NEGATIVE NEGATIVE Final    Comment: (NOTE) Fact Sheet for Patients: BloggerCourse.com  Fact Sheet for Healthcare Providers: SeriousBroker.it  This test is not yet approved or cleared by the [place] FDA and has been authorized for detection and/or diagnosis of SARS-CoV-2 by FDA under an Emergency Use Authorization (EUA). This EUA will remain in effect (meaning this test can be used) for the duration of the COVID-19 declaration under Section 564(b)(1) of the Act, 21 U.S.C. section 360bbb-3(b)(1), unless the authorization is terminated or revoked.  Performed at [facility], [address],  [place], [place] [address]   MRSA Next Gen by PCR, Nasal     Status: None   Collection Time: 10/13/23  7:53 PM   Specimen: Nasal Mucosa; Nasal Swab  Result Value Ref Range Status   MRSA by PCR Next Gen NOT DETECTED NOT DETECTED Final    Comment: (NOTE) The GeneXpert MRSA Assay (FDA approved for NASAL specimens only), is one component of a comprehensive MRSA colonization surveillance program. It is not intended to diagnose MRSA infection nor to guide or monitor treatment for MRSA infections. Test performance is not FDA approved in patients less than 28 years old. Performed at [facility], [address], [place], [place] [address]          Radiology Studies: CARDIAC CATHETERIZATION Result Date: 10/20/2023 3 vessel obstructive CAD. Suspect culprit vessel is the LCx. Low LVEDP 4 mm Hg Plan: recommend medical therapy. She is not having significant angina. She is currently a poor candidate for CABG. Poor targets for PCI.    Scheduled Meds:  amLODipine  5 mg Oral Daily   aspirin EC  81 mg Oral Daily   atorvastatin  80 mg Oral Daily   budesonide (PULMICORT) nebulizer solution  0.5 mg Nebulization BID   clopidogrel  75 mg Oral Daily   feeding supplement  237 mL Oral BID BM   heparin  5,000 Units Subcutaneous Q8H   insulin aspart  0-15 Units Subcutaneous TID WC   insulin aspart  0-5 Units Subcutaneous QHS   metoprolol succinate  25 mg Oral Daily   multivitamin with minerals  1 tablet Oral Daily   sodium chloride flush  3 mL Intravenous Q12H   sodium chloride flush  3 mL Intravenous Q12H   Continuous Infusions:  iron sucrose 300 mg (10/21/23 1118)     Osvaldo Shipper, MD [facility] 10/22/2023, 8:32 AM

## 2023-10-22 NOTE — Plan of Care (Signed)
   Problem: Education: Goal: Ability to describe self-care measures that may prevent or decrease complications (Diabetes Survival Skills Education) will improve Outcome: Progressing   Problem: Coping: Goal: Ability to adjust to condition or change in health will improve Outcome: Progressing   Problem: Tissue Perfusion: Goal: Adequacy of tissue perfusion will improve Outcome: Progressing

## 2023-10-22 NOTE — TOC Progression Note (Signed)
 Transition of Care [facility]) - Progression Note    Patient Details  Name: Anita Mccormick MRN: [id number] Date of Birth: 08-10-1945  [facility]) CM/SW Contact  Leone Haven, RN Phone Number: 10/22/2023, 11:07 AM  Clinical Narrative:    CIR following, for possible candidacy for CIR.  TOC will continue to follow.   Expected Discharge Plan:  (Pending PT evaluation) Barriers to Discharge: Continued Medical Work up  Expected Discharge Plan and Services   Discharge Planning Services: CM Consult Post Acute Care Choice: Skilled Nursing Facility Living arrangements for the past 2 months: Single Family Home                                       Social Determinants of Health (SDOH) Interventions SDOH Screenings   Food Insecurity: No Food Insecurity (10/13/2023)  Housing: Low Risk  (10/13/2023)  Transportation Needs: No Transportation Needs (10/13/2023)  Utilities: Not At Risk (10/13/2023)  Alcohol Screen: Low Risk  (10/03/2022)  Depression (PHQ2-9): Low Risk  (07/06/2023)  Financial Resource Strain: Low Risk  (10/03/2022)  Physical Activity: Inactive (10/03/2022)  Social Connections: Moderately Isolated (10/13/2023)  Stress: No Stress Concern Present (10/03/2022)  Tobacco Use: High Risk (10/13/2023)    Readmission Risk Interventions    10/19/2023   10:43 AM  Readmission Risk Prevention Plan  Post Dischage Appt Complete  Medication Screening Complete  Transportation Screening Complete

## 2023-10-22 NOTE — [place] FL2 (Signed)
 [place] MEDICAID FL2 LEVEL OF CARE FORM     IDENTIFICATION  Patient Name: Anita Mccormick Birthdate: July 24, 1946 Sex: female Admission Date (Current Location): 10/13/2023  [facility] and [place] Number:  Producer, television/film/video and Address:  [facility],  [address] [place], [place] [address]      Provider Number: [id number]  Attending Physician Name and Address:  Osvaldo Shipper, MD  Relative Name and Phone Number:       Current Level of Care: Hospital Recommended Level of Care: Skilled Nursing Facility Prior Approval Number:    Date Approved/Denied:   PASRR Number: [id number]  Discharge Plan: SNF    Current Diagnoses: Patient Active Problem List   Diagnosis Date Noted   Non-ST elevation (NSTEMI) myocardial infarction (HCC) 10/21/2023   Malnutrition of moderate degree 10/16/2023   Acute respiratory failure with hypoxia (HCC) 10/13/2023   Influenza A 10/13/2023   Cardiac arrest (HCC) 10/13/2023   Pneumothorax on left 10/13/2023   Central retinal artery occlusion of right eye 10/25/2021   Atherosclerosis of aorta (HCC) 05/18/2020   Cirrhosis of liver without ascites (HCC) 05/18/2020   Internal hemorrhoids 06/22/2019   Anemia    Chronic kidney disease (CKD), stage II (mild) 09/19/2017   Leukopenia 09/19/2017   Chronic diastolic CHF (congestive heart failure) (HCC) 09/19/2017   Statins contraindicated 06/12/2017   Hepatotoxicity due to statin drug 06/12/2017   History of adenomatous polyp of colon 03/18/2017   Viral URI with cough 01/17/2017   Thrombocytopenia (HCC) 12/19/2015   Insomnia 08/21/2015   Nicotine use disorder 05/21/2015   Eczema 05/21/2015   Fatty liver disease, nonalcoholic 07/14/2014   Hyperlipidemia 11/03/2007   History of CVA (cerebrovascular accident) 10/20/2007   Type II diabetes mellitus with renal manifestations (HCC) 12/10/2006   Essential hypertension 12/10/2006   History of breast cancer 12/10/2006    Orientation RESPIRATION  BLADDER Height & Weight     Self, Time, Situation, Place  Normal Incontinent, External catheter Weight: 150 lb 9.2 oz (68.3 kg) Height:  5\' 2"  (157.5 cm)  BEHAVIORAL SYMPTOMS/MOOD NEUROLOGICAL BOWEL NUTRITION STATUS      Continent Diet (see dc summary)  AMBULATORY STATUS COMMUNICATION OF NEEDS Skin   Extensive Assist Verbally Normal                       Personal Care Assistance Level of Assistance  Bathing, Feeding, Dressing Bathing Assistance: Limited assistance Feeding assistance: Limited assistance Dressing Assistance: Limited assistance     Functional Limitations Info  Sight, Hearing, Speech Sight Info: Adequate (eyeglasses) Hearing Info: Adequate Speech Info: Adequate    SPECIAL CARE FACTORS FREQUENCY  PT (By licensed PT), OT (By licensed OT)     PT Frequency: 5x week OT Frequency: 5x week            Contractures Contractures Info: Not present    Additional Factors Info  Code Status, Allergies, Insulin Sliding Scale Code Status Info: Full Allergies Info: Bee Venom, Penicillins, Ace Inhibitors, Chlorthalidone, Metformin, Sulfamethoxazole   Insulin Sliding Scale Info: see dc summary       Current Medications (10/22/2023):  This is the current hospital active medication list Current Facility-Administered Medications  Medication Dose Route Frequency Provider Last Rate Last Admin   acetaminophen (TYLENOL) tablet 650 mg  650 mg Oral Q6H PRN [place], Peter M, MD   650 mg at 10/21/23 2214   Or   acetaminophen (TYLENOL) suppository 650 mg  650 mg Rectal Q6H PRN [place], Peter M,  MD       amLODipine (NORVASC) tablet 5 mg  5 mg Oral Daily [place], Peter M, MD   5 mg at 10/22/23 1016   aspirin EC tablet 81 mg  81 mg Oral Daily [place], Peter M, MD   81 mg at 10/22/23 1016   atorvastatin (LIPITOR) tablet 80 mg  80 mg Oral Daily [place], Peter M, MD   80 mg at 10/22/23 1015   budesonide (PULMICORT) nebulizer solution 0.5 mg  0.5 mg Nebulization BID [place], Peter M, MD    0.5 mg at 10/22/23 0750   clopidogrel (PLAVIX) tablet 75 mg  75 mg Oral Daily Tonny Bollman, MD   75 mg at 10/22/23 1016   feeding supplement (ENSURE ENLIVE / ENSURE PLUS) liquid 237 mL  237 mL Oral BID BM [place], Peter M, MD   237 mL at 10/20/23 1725   heparin injection 5,000 Units  5,000 Units Subcutaneous Q8H [place], Peter M, MD   5,000 Units at 10/22/23 0542   hydrALAZINE (APRESOLINE) injection 10 mg  10 mg Intravenous Q4H PRN [place], Peter M, MD   10 mg at 10/17/23 6578   insulin aspart (novoLOG) injection 0-15 Units  0-15 Units Subcutaneous TID [facility] [place], Peter M, MD   2 Units at 10/22/23 4696   insulin aspart (novoLOG) injection 0-5 Units  0-5 Units Subcutaneous QHS [place], Peter M, MD       ipratropium-albuterol (DUONEB) 0.5-2.5 (3) MG/3ML nebulizer solution 3 mL  3 mL Nebulization Q4H PRN [place], Peter M, MD       metoprolol succinate (TOPROL-XL) 24 hr tablet 25 mg  25 mg Oral Daily Tonny Bollman, MD   25 mg at 10/22/23 1015   multivitamin with minerals tablet 1 tablet  1 tablet Oral Daily [place], Peter M, MD   1 tablet at 10/22/23 1015   Oral care mouth rinse  15 mL Mouth Rinse PRN [place], Peter M, MD       Oral care mouth rinse  15 mL Mouth Rinse PRN [place], Peter M, MD       sodium chloride flush (NS) 0.9 % injection 3 mL  3 mL Intravenous Q12H [place], Peter M, MD   3 mL at 10/22/23 1046   sodium chloride flush (NS) 0.9 % injection 3 mL  3 mL Intravenous Q12H [place], Peter M, MD   3 mL at 10/22/23 1018   sodium chloride flush (NS) 0.9 % injection 3 mL  3 mL Intravenous PRN [place], Peter M, MD       white petrolatum (VASELINE) gel   Topical PRN [place], Peter M, MD         Discharge Medications: Please see discharge summary for a list of discharge medications.  Relevant Imaging Results:  Relevant Lab Results:   Additional Information SSN [id number] [place], [place]

## 2023-10-22 NOTE — Progress Notes (Signed)
 Occupational Therapy Treatment Patient Details Name: Anita Mccormick MRN: [id number] DOB: May 23, 1946 Today's Date: 10/22/2023   History of present illness Pt is 78 yo presenting to [facility] ED on 3/18 due to worsening shortness of breathe, orthopnea and wheezing with residual cough since COVID. Pt had NSTEMI while in ED with plan for LHC prior to discharge. PMH: HTN, HLD, CVA, DM, CKD II, HFEF, anemia, breast cancer, NAFLD/cirrhosis and COVID 08/2023   OT comments  With encouragement, pt came to EOB without assist, stood and ambulated to sink for grooming with CGA and RW and remained in chair with assist to set up lunch tray. Pt fatigues easily. Husband in room and also encouraging pt to mobilize OOB. Patient will benefit from intensive inpatient follow-up therapy, >3 hours/day.       If plan is discharge home, recommend the following:  A little help with walking and/or transfers;A lot of help with bathing/dressing/bathroom;Assistance with cooking/housework;Assistance with feeding;Assist for transportation;Help with stairs or ramp for entrance   Equipment Recommendations  Other (comment) (defer to next venue of care)    Recommendations for Other Services      Precautions / Restrictions Precautions Precautions: Fall Recall of Precautions/Restrictions: Intact Restrictions Weight Bearing Restrictions Per Provider Order: No       Mobility Bed Mobility Overal bed mobility: Modified Independent             General bed mobility comments: increased time, HOB up    Transfers Overall transfer level: Needs assistance Equipment used: Rolling walker (2 wheels) Transfers: Sit to/from Stand, Bed to chair/wheelchair/BSC Sit to Stand: Contact guard assist           General transfer comment: good technique with RW     Balance Overall balance assessment: Needs assistance   Sitting balance-Leahy Scale: Good       Standing balance-Leahy Scale: Poor Standing balance comment: reliant on  RW, can stand at sink without UB support                           ADL either performed or assessed with clinical judgement   ADL Overall ADL's : Needs assistance/impaired Eating/Feeding: Minimal assistance;Sitting Eating/Feeding Details (indicate cue type and reason): assist to cut food, set up tray Grooming: Wash/dry hands;Standing;Contact guard assist           Upper Body Dressing : Set up;Sitting Upper Body Dressing Details (indicate cue type and reason): front opening gown                 Functional mobility during ADLs: Contact guard assist;Rolling walker (2 wheels) General ADL Comments: ambulated bed to sink for grooming and to chair for lunch with RW    Extremity/Trunk Assessment              Vision       Perception     Praxis     Communication Communication Communication: No apparent difficulties   Cognition Arousal: Alert Behavior During Therapy: WFL for tasks assessed/performed Cognition: Cognition impaired   Orientation impairments: Time, Situation Awareness: Intellectual awareness impaired, Online awareness impaired Memory impairment (select all impairments): Declarative long-term memory, Working [employer] fast streamer, Emergency planning/management officer functioning impairment (select all impairments): Problem solving OT - Cognition Comments: pt requiring some encouragement for OOB                 Following commands: Intact        Cueing   Cueing Techniques: Verbal  cues  Exercises      Shoulder Instructions       General Comments      Pertinent Vitals/ Pain       Pain Assessment Pain Assessment: No/denies pain Faces Pain Scale: Hurts a little bit Pain Location: buttocks, generalized Pain Descriptors / Indicators: Discomfort Pain Intervention(s): Monitored during session, Repositioned  Home Living                                          Prior Functioning/Environment              Frequency  Min 2X/week         Progress Toward Goals  OT Goals(current goals can now be found in the care plan section)  Progress towards OT goals: Progressing toward goals  Acute Rehab OT Goals OT Goal Formulation: With patient Time For Goal Achievement: 11/01/23 Potential to Achieve Goals: Good  Plan      Co-evaluation                 AM-PAC OT "6 Clicks" Daily Activity     Outcome Measure   Help from another person eating meals?: A Little Help from another person taking care of personal grooming?: A Little Help from another person toileting, which includes using toliet, bedpan, or urinal?: A Lot Help from another person bathing (including washing, rinsing, drying)?: A Lot Help from another person to put on and taking off regular upper body clothing?: A Little Help from another person to put on and taking off regular lower body clothing?: A Lot 6 Click Score: 15    End of Session Equipment Utilized During Treatment: Gait belt;Rolling walker (2 wheels)  OT Visit Diagnosis: Unsteadiness on feet (R26.81);Other abnormalities of gait and mobility (R26.89);Muscle weakness (generalized) (M62.81)   Activity Tolerance Patient limited by fatigue   Patient Left in chair;with call bell/phone within reach;with chair alarm set;with family/visitor present   Nurse Communication          Time: 0272-5366 OT Time Calculation (min): 24 min  Charges: OT General Charges $OT Visit: 1 Visit OT Treatments $Self Care/Home Management : 23-37 mins  Berna Spare, OTR/L Acute Rehabilitation Services Office: [phone]   Evern Bio 10/22/2023, 12:16 PM

## 2023-10-23 ENCOUNTER — Inpatient Hospital Stay (HOSPITAL_COMMUNITY)

## 2023-10-23 DIAGNOSIS — R509 Fever, unspecified: Secondary | ICD-10-CM

## 2023-10-23 DIAGNOSIS — I10 Essential (primary) hypertension: Secondary | ICD-10-CM | POA: Diagnosis not present

## 2023-10-23 DIAGNOSIS — E1129 Type 2 diabetes mellitus with other diabetic kidney complication: Secondary | ICD-10-CM | POA: Diagnosis not present

## 2023-10-23 DIAGNOSIS — N182 Chronic kidney disease, stage 2 (mild): Secondary | ICD-10-CM | POA: Diagnosis not present

## 2023-10-23 LAB — GASTROINTESTINAL PANEL BY PCR, STOOL (REPLACES STOOL CULTURE)

## 2023-10-23 LAB — GLUCOSE, CAPILLARY
Glucose-Capillary: 143 mg/dL — ABNORMAL HIGH (ref 70–99)
Glucose-Capillary: 163 mg/dL — ABNORMAL HIGH (ref 70–99)
Glucose-Capillary: 211 mg/dL — ABNORMAL HIGH (ref 70–99)
Glucose-Capillary: 139 mg/dL — ABNORMAL HIGH (ref 70–99)

## 2023-10-23 LAB — URINALYSIS, ROUTINE W REFLEX MICROSCOPIC
Bilirubin Urine: NEGATIVE
Glucose, UA: 50 mg/dL — AB
Hgb urine dipstick: NEGATIVE
Ketones, ur: 20 mg/dL — AB
Nitrite: NEGATIVE
Protein, ur: NEGATIVE mg/dL
Specific Gravity, Urine: 1.015 (ref 1.005–1.030)
pH: 5 (ref 5.0–8.0)

## 2023-10-23 LAB — DIFFERENTIAL
Abs Immature Granulocytes: 0.15 10*3/uL — ABNORMAL HIGH (ref 0.00–0.07)
Basophils Absolute: 0 10*3/uL (ref 0.0–0.1)
Basophils Relative: 0 %
Eosinophils Absolute: 0.2 10*3/uL (ref 0.0–0.5)
Eosinophils Relative: 2 %
Immature Granulocytes: 1 %
Lymphocytes Relative: 6 %
Lymphs Abs: 0.7 10*3/uL (ref 0.7–4.0)
Monocytes Absolute: 1 10*3/uL (ref 0.1–1.0)
Monocytes Relative: 9 %
Neutro Abs: 9.4 10*3/uL — ABNORMAL HIGH (ref 1.7–7.7)
Neutrophils Relative %: 82 %

## 2023-10-23 LAB — CBC
HCT: 28.4 % — ABNORMAL LOW (ref 36.0–46.0)
Hemoglobin: 9 g/dL — ABNORMAL LOW (ref 12.0–15.0)
MCH: 34.1 pg — ABNORMAL HIGH (ref 26.0–34.0)
MCHC: 31.7 g/dL (ref 30.0–36.0)
MCV: 107.6 fL — ABNORMAL HIGH (ref 80.0–100.0)
Platelets: 168 10*3/uL (ref 150–400)
RBC: 2.64 MIL/uL — ABNORMAL LOW (ref 3.87–5.11)
RDW: 14 % (ref 11.5–15.5)
WBC: 10.7 10*3/uL — ABNORMAL HIGH (ref 4.0–10.5)
nRBC: 0.4 % — ABNORMAL HIGH (ref 0.0–0.2)

## 2023-10-23 LAB — COMPREHENSIVE METABOLIC PANEL WITH GFR
ALT: 47 U/L — ABNORMAL HIGH (ref 0–44)
AST: 51 U/L — ABNORMAL HIGH (ref 15–41)
Albumin: 2.4 g/dL — ABNORMAL LOW (ref 3.5–5.0)
Alkaline Phosphatase: 61 U/L (ref 38–126)
Anion gap: 11 (ref 5–15)
BUN: 7 mg/dL — ABNORMAL LOW (ref 8–23)
CO2: 21 mmol/L — ABNORMAL LOW (ref 22–32)
Calcium: 8.3 mg/dL — ABNORMAL LOW (ref 8.9–10.3)
Chloride: 106 mmol/L (ref 98–111)
Creatinine, Ser: 0.92 mg/dL (ref 0.44–1.00)
GFR, Estimated: >60 mL/min (ref >60)
Glucose, Bld: 115 mg/dL — ABNORMAL HIGH (ref 70–99)
Potassium: 3.8 mmol/L (ref 3.5–5.1)
Sodium: 138 mmol/L (ref 135–145)
Total Bilirubin: 1.3 mg/dL — ABNORMAL HIGH (ref 0.0–1.2)
Total Protein: 5.9 g/dL — ABNORMAL LOW (ref 6.5–8.1)

## 2023-10-23 LAB — PHOSPHORUS: Phosphorus: 2.1 mg/dL — ABNORMAL LOW (ref 2.5–4.6)

## 2023-10-23 LAB — MAGNESIUM: Magnesium: 1.8 mg/dL (ref 1.7–2.4)

## 2023-10-23 MED ORDER — DOXYCYCLINE HYCLATE 100 MG PO TABS
100.0000 mg | ORAL_TABLET | Freq: Two times a day (BID) | ORAL | Status: AC
  Administered 2023-10-23 – 2023-10-29 (×13): 100 mg via ORAL
  Filled 2023-10-23 (×14): qty 1

## 2023-10-23 MED ORDER — GUAIFENESIN 100 MG/5ML PO LIQD
5.0000 mL | ORAL | Status: DC | PRN
  Administered 2023-10-23 – 2023-11-01 (×14): 5 mL via ORAL
  Filled 2023-10-23 (×15): qty 10

## 2023-10-23 NOTE — Progress Notes (Signed)
 PROGRESS NOTE    Anita Mccormick  ZOX:[id number] DOB: 1946/06/05 DOA: 10/13/2023 PCP: Deeann Saint, MD   Brief Narrative:  78 year old female with history of tobacco abuse, hypertension, hyperlipidemia, unspecified CVA, diabetes mellitus type 2, CKD stage II, chronic diastolic heart failure, anemia, breast cancer, NAFLD/cirrhosis and COVID-19 infection in 08/2023 presented with worsening shortness of breath.  On presentation, she required supplemental oxygen and was found to be influenza A positive; chest x-ray was negative for acute infiltrates.  She was started on Tamiflu.  Subsequently, had a syncopal event and was found to be more hypoxic.  Later on, she was found to be in PEA bradycardic/asystolic arrest with ROSC after 3 rounds of ACLS.  She was subsequently intubated and transferred to ICU.  PCCM and cardiology were consulted.  She was then found to have left pneumothorax for which pigtail catheter was placed.  She also needed vasopressors for shock along with IV antibiotics.  Troponins trended to more than 24,000.  She was started on heparin drip.  Echo showed EF of 60 to 65% with no valvular issues.  She was also started on IV Solu-Medrol by PCCM.  Subsequently, vasopressors were stopped.  She was extubated on 10/17/2023.Chest tube was subsequently removed.  She was transferred to [facility] service from 10/18/2023 onwards.  Cardiology planning for cardiac catheterization today.  Assessment & Plan:   PEA cardiac arrest Non-STEMI History of chronic diastolic heart failure Probable cardiogenic shock: Resolved Coronary artery disease -With ROSC after 3 rounds of CPR. -Required ICU care with vasopressors/ventilatory support/broad-spectrum antibiotics -Subsequently extubated and weaned off vasopressors.  Transferred to [facility] service from 10/18/2023 onwards -Troponins trended to more than 24,000.  Treated with heparin drip and subsequently discontinued.  Echo showed EF of 60 to 65% with no valvular  issues.  Patient was seen by cardiology.  Underwent cardiac catheterization on 3/25.  Triple-vessel disease was noted but none of them amenable to PCI.  Not thought to be a good candidate for bypass surgery.  Medical management is being pursued. She is noted to be on aspirin, amlodipine, statin. No further cardiac workup at this time.  Cardiology has signed off.  Influenza A pneumonia COPD exacerbation Acute respiratory failure with hypoxia -Treated with Tamiflu, steroids for 5 days.  Also completed 5 days of Rocephin.  Continue current nebs.  Add DuoNeb as needed. Respiratory status is stable. Has been weaned off of oxygen.  Saturations are in the early 90s on room air.  Continue to monitor. Patient does mention cough with yellowish expectoration.  Noted to have low-grade fever with a bump in WBC today.  Will repeat chest x-ray.  Check UA.  Did have some loose stool but does not appear to be perfused based on charting.  Will hold off on stool studies for now.  It is benign on examination.  Mildly abnormal LFTs Mildly elevated AST and ALT likely related to her cardiac arrest.  No tenderness in the right upper quadrant.  Continue to monitor.  Pneumothorax -Required pigtail catheter placement which has subsequently been removed.  Essential hypertension  Patient is currently on amlodipine and metoprolol.  Blood pressure noted to be quite high this morning.  Will increase the dose of amlodipine.    Syncope Echo and cardiac catheterization as above.  Telemetry with no abnormalities.  Has completed workup.  Thrombocytopenia -Questionable cause.  Monitor.  No signs of bleeding Counts are improving.  Macrocytic anemia Possible iron deficiency anemia. -B12 and folate normal. -IV iron was ordered.  History of unspecified CVA Hyperlipidemia -No deficits.  Continue aspirin and statin  Diabetes mellitus type 2 with hyperglycemia -Continue CBGs with SSI.  A1c 5  NAFLD/cirrhosis -Appears  compensated.  Outpatient follow-up with GI  History of breast cancer -Outpatient follow-up with oncology  Physical deconditioning Seen by PT and OT.  Rehabilitation is being pursued.  DVT prophylaxis: Heparin subcutaneous Code Status: Full Family Communication: Son is at the bedside Disposition Plan: CIR versus SNF    Consultants: PCCM/cardiology  Procedures: As above   Subjective: Feels about the same.  Did have some loose stools but last episode was yesterday morning.  Denies any dysuria per se.  Does have a cough with yellowish expectoration.    Objective: Vitals:   10/23/23 0428 10/23/23 0702 10/23/23 0709 10/23/23 0827  BP: (!) 158/61  (!) 172/58   Pulse: 95  90   Resp: 18  (!) 26   Temp:   99.4 F (37.4 C)   TempSrc:   Oral   SpO2: 95%  97% 96%  Weight:  67.6 kg    Height:        Intake/Output Summary (Last 24 hours) at 10/23/2023 0850 Last data filed at 10/23/2023 0824 Gross per 24 hour  Intake 240 ml  Output 800 ml  Net -560 ml   Filed Weights   10/21/23 0600 10/22/23 0427 10/23/23 0702  Weight: 66.3 kg 68.3 kg 67.6 kg    Examination:  General appearance: Awake alert.  In no distress Resp: Normal effort at rest.  Coarse breath sounds bilaterally with few crackles at the bases. Cardio: S1-S2 is normal regular.  No S3-S4.  No rubs murmurs or bruit GI: Abdomen is soft.  Nontender nondistended.  Bowel sounds are present normal.  No masses organomegaly Extremities: No edema.  Physical deconditioning is noted. Neurologic:  No focal neurological deficits.     Data Reviewed:   CBC: Recent Labs  Lab 10/18/23 0834 10/19/23 1043 10/20/23 0621 10/21/23 0253 10/23/23 0251  WBC 7.8 8.3 4.7 5.5 10.7*  NEUTROABS 6.7 6.9  --  4.4  --   HGB 9.3* 11.0* 9.1* 8.6* 9.0*  HCT 28.6* 34.4* 27.5* 26.2* 28.4*  MCV 107.5* 108.5* 105.0* 104.8* 107.6*  PLT 76* 106* 111* 124* 168   Basic Metabolic Panel: Recent Labs  Lab 10/18/23 0834 10/19/23 1043  10/20/23 0621 10/21/23 0253 10/23/23 0251  NA 141 141 139 140 138  K 4.7 4.6 3.6 3.5 3.8  CL 108 109 107 108 106  CO2 21* 22 23 24  21*  GLUCOSE 229* 186* 165* 134* 115*  BUN 15 21 17 12  7*  CREATININE 1.19* 0.94 0.94 0.93 0.92  CALCIUM 8.9 9.3 8.6* 8.1* 8.3*  MG  --   --   --  1.9 1.8  PHOS  --   --   --   --  2.1*   GFR: Estimated Creatinine Clearance: 46.9 mL/min (by C-G formula based on SCr of 0.92 mg/dL).  CBG: Recent Labs  Lab 10/22/23 0602 10/22/23 1111 10/22/23 1520 10/22/23 2133 10/23/23 0626  GLUCAP 131* 194* 180* 129* 143*     Recent Results (from the past 240 hours)  MRSA Next Gen by PCR, Nasal     Status: None   Collection Time: 10/13/23  7:53 PM   Specimen: Nasal Mucosa; Nasal Swab  Result Value Ref Range Status   MRSA by PCR Next Gen NOT DETECTED NOT DETECTED Final    Comment: (NOTE) The GeneXpert MRSA Assay (FDA approved for NASAL  specimens only), is one component of a comprehensive MRSA colonization surveillance program. It is not intended to diagnose MRSA infection nor to guide or monitor treatment for MRSA infections. Test performance is not FDA approved in patients less than 68 years old. Performed at [facility], [address], [place], [place] [address]        Radiology Studies: No results found.   Scheduled Meds:  amLODipine  5 mg Oral Daily   aspirin EC  81 mg Oral Daily   atorvastatin  80 mg Oral Daily   budesonide (PULMICORT) nebulizer solution  0.5 mg Nebulization BID   clopidogrel  75 mg Oral Daily   feeding supplement  237 mL Oral BID BM   heparin  5,000 Units Subcutaneous Q8H   insulin aspart  0-15 Units Subcutaneous TID WC   insulin aspart  0-5 Units Subcutaneous QHS   metoprolol succinate  25 mg Oral Daily   multivitamin with minerals  1 tablet Oral Daily   sodium chloride flush  3 mL Intravenous Q12H   sodium chloride flush  3 mL Intravenous Q12H   Continuous Infusions:     Osvaldo Shipper, MD [facility] 10/23/2023, 8:50 AM

## 2023-10-23 NOTE — Progress Notes (Signed)
 IP rehab admissions - We have received a denial from insurance carrier.  Dr. Riley Kill did a peer to peer review, but we still have a denial.  Options now will be SNF or home with [facility] therapies.  I have let TOC know of denial.  I will not appeal this denial decision.  [phone]

## 2023-10-23 NOTE — Plan of Care (Signed)
  Problem: Skin Integrity: Goal: Risk for impaired skin integrity will decrease Outcome: Progressing   Problem: Respiratory: Goal: Ability to maintain a clear airway and adequate ventilation will improve Outcome: Progressing   Problem: Education: Goal: Knowledge of General Education information will improve Description: Including pain rating scale, medication(s)/side effects and non-pharmacologic comfort measures Outcome: Progressing   Problem: Clinical Measurements: Goal: Ability to maintain clinical measurements within normal limits will improve Outcome: Progressing Goal: Respiratory complications will improve Outcome: Progressing Goal: Cardiovascular complication will be avoided Outcome: Progressing   Problem: Elimination: Goal: Will not experience complications related to urinary retention Outcome: Progressing   Problem: Health Behavior/Discharge Planning: Goal: Ability to manage health-related needs will improve Outcome: Not Progressing   Problem: Clinical Measurements: Goal: Will remain free from infection Outcome: Not Progressing   Problem: Activity: Goal: Risk for activity intolerance will decrease Outcome: Not Progressing

## 2023-10-23 NOTE — Progress Notes (Signed)
 Physical Therapy Treatment Patient Details Name: Anita Mccormick MRN: [id number] DOB: [birth year] Today's Date: 10/23/2023   History of Present Illness Pt is 78 yo presenting to [facility] ED on 3/18 due to worsening shortness of breathe, orthopnea and wheezing with residual cough since COVID. Pt had NSTEMI while in ED with plan for [facility] prior to discharge. PMH: HTN, HLD, CVA, DM, CKD II, HFEF, anemia, breast cancer, NAFLD/cirrhosis and COVID 08/2023    PT Comments  Pt increased gait distance, ambulating ~59ft using RW with CGA. She demonstrated improved gait mechanics, no longer shuffling when her slippers were off. Pt continues to fatigue with mobility and requires standing rest breaks and cues for PLB technique. Will continue to follow acutely and advance appropriately.     If plan is discharge home, recommend the following: A little help with walking and/or transfers;Assist for transportation;Help with stairs or ramp for entrance;Assistance with cooking/housework;A little help with bathing/dressing/bathroom   Can travel by private vehicle     Yes  Equipment Recommendations  Rolling walker (2 wheels);BSC/3in1;Wheelchair (measurements PT);Wheelchair cushion (measurements PT)    Recommendations for Other Services       Precautions / Restrictions Precautions Precautions: Fall Recall of Precautions/Restrictions: Intact Restrictions Weight Bearing Restrictions Per Provider Order: No     Mobility  Bed Mobility Overal bed mobility: Needs Assistance Bed Mobility: Supine to Sit     Supine to sit: HOB elevated, Contact guard     General bed mobility comments: Pt sat up on R side of bed with increased time. She pulled on PT to bring trunk up and reach foot flat.    Transfers Overall transfer level: Needs assistance Equipment used: Rolling walker (2 wheels) Transfers: Sit to/from Stand Sit to Stand: Contact guard assist           General transfer comment: Pt stood from lowest bed height  with proper hand positioning on RW. She required CGA to power up into standing. Good eccentric control with sitting.    Ambulation/Gait Ambulation/Gait assistance: Contact guard assist Gait Distance (Feet): 40 Feet Assistive device: Rolling walker (2 wheels) Gait Pattern/deviations: Step-to pattern, Decreased step length - right, Decreased step length - left, Trunk flexed, Wide base of support Gait velocity: decreased Gait velocity interpretation: <1.31 ft/sec, indicative of household ambulator   General Gait Details: Pt ambulated with short, small steps. She did not wear her slippers this time and was able to demonstrated push off, heel strike, and adequate foot clearence instead of shuffling. Pt maintained body inside RW at all times. She did a good job of safely turning.   Stairs             Wheelchair Mobility     Tilt Bed    Modified Rankin (Stroke Patients Only)       Balance Overall balance assessment: Needs assistance Sitting-balance support: Feet supported, No upper extremity supported Sitting balance-Leahy Scale: Good     Standing balance support: Bilateral upper extremity supported, During functional activity, Reliant on assistive device for balance Standing balance-Leahy Scale: Poor Standing balance comment: Pt dependent on RW for stability. She maintained static stance while hygiene was addressed.                            Communication Communication Communication: No apparent difficulties  Cognition Arousal: Alert Behavior During Therapy: WFL for tasks assessed/performed   PT - Cognitive impairments: No apparent impairments  Following commands: Intact      Cueing Cueing Techniques: Verbal cues  Exercises      General Comments General comments (skin integrity, edema, etc.): VSS on RA.      Pertinent Vitals/Pain Pain Assessment Pain Assessment: No/denies pain    Home Living                           Prior Function            PT Goals (current goals can now be found in the care plan section) Acute Rehab PT Goals Patient Stated Goal: Go to rehab Progress towards PT goals: Progressing toward goals    Frequency    Min 1X/week      PT Plan      Co-evaluation              AM-PAC PT "6 Clicks" Mobility   Outcome Measure  Help needed turning from your back to your side while in a flat bed without using bedrails?: A Little Help needed moving from lying on your back to sitting on the side of a flat bed without using bedrails?: A Little Help needed moving to and from a bed to a chair (including a wheelchair)?: A Little Help needed standing up from a chair using your arms (e.g., wheelchair or bedside chair)?: A Little Help needed to walk in hospital room?: A Little Help needed climbing 3-5 steps with a railing? : Total 6 Click Score: 16    End of Session Equipment Utilized During Treatment: Gait belt Activity Tolerance: Patient tolerated treatment well Patient left: in chair;with chair alarm set;with call bell/phone within reach;with family/visitor present Nurse Communication: Mobility status PT Visit Diagnosis: Unsteadiness on feet (R26.81);Other abnormalities of gait and mobility (R26.89)     Time: 6387-5643 PT Time Calculation (min) (ACUTE ONLY): 25 min  Charges:    $Gait Training: 23-37 mins PT General Charges $$ ACUTE PT VISIT: 1 Visit                     Cheri Guppy, PT, DPT Acute Rehabilitation Services Office: [phone] Secure Chat Preferred  Richardson Chiquito 10/23/2023, 3:17 PM

## 2023-10-23 NOTE — Plan of Care (Signed)
  Problem: Education: Goal: Ability to describe self-care measures that may prevent or decrease complications (Diabetes Survival Skills Education) will improve Outcome: Progressing   Problem: Coping: Goal: Ability to adjust to condition or change in health will improve Outcome: Progressing   Problem: Skin Integrity: Goal: Risk for impaired skin integrity will decrease Outcome: Progressing   Problem: Tissue Perfusion: Goal: Adequacy of tissue perfusion will improve Outcome: Progressing   Problem: Role Relationship: Goal: Method of communication will improve Outcome: Progressing

## 2023-10-23 NOTE — Progress Notes (Signed)
 SLP Cancellation Note  Patient Details Name: Anita Mccormick MRN: [id number] DOB: Jul 05, 1946   Cancelled treatment:       Reason Eval/Treat Not Completed: Patient at procedure or test/unavailable (receiving nursing care). SLP will f/u as time permits.    Gwynneth Aliment, M.A., CF-SLP Speech Language Pathology, Acute Rehabilitation Services  Secure Chat preferred [phone]  10/23/2023, 11:36 AM

## 2023-10-23 NOTE — TOC Progression Note (Signed)
 Transition of Care [facility]) - Progression Note    Patient Details  Name: Anita Mccormick MRN: [id number] Date of Birth: [birth year]  [facility]) CM/SW Contact  Michaela Corner, [place] Phone Number: 10/23/2023, 1:00 PM  Clinical Narrative:   CSW emailed patients daughter [email or web address] rating and bed list of accepting SNFs at this time, per daughters request. CSW will follow up on bed choice.   TOC will continue to follow.    Expected Discharge Plan: Skilled Nursing Facility Barriers to Discharge: Continued Medical Work up  Expected Discharge Plan and Services   Discharge Planning Services: CM Consult Post Acute Care Choice: Skilled Nursing Facility Living arrangements for the past 2 months: Single Family Home                                       Social Determinants of Health (SDOH) Interventions SDOH Screenings   Food Insecurity: No Food Insecurity (10/13/2023)  Housing: Low Risk  (10/13/2023)  Transportation Needs: No Transportation Needs (10/13/2023)  Utilities: Not At Risk (10/13/2023)  Alcohol Screen: Low Risk  (10/03/2022)  Depression (PHQ2-9): Low Risk  (07/06/2023)  Financial Resource Strain: Low Risk  (10/03/2022)  Physical Activity: Inactive (10/03/2022)  Social Connections: Moderately Isolated (10/13/2023)  Stress: No Stress Concern Present (10/03/2022)  Tobacco Use: High Risk (10/13/2023)    Readmission Risk Interventions    10/19/2023   10:43 AM  Readmission Risk Prevention Plan  Post Dischage Appt Complete  Medication Screening Complete  Transportation Screening Complete

## 2023-10-23 NOTE — Evaluation (Signed)
 Clinical/Bedside Swallow Evaluation Patient Details  Name: Anita Mccormick MRN: [id number] Date of Birth: Apr 29, 1946  Today's Date: 10/23/2023 Time: SLP Start Time (ACUTE ONLY): 1220 SLP Stop Time (ACUTE ONLY): 1235 SLP Time Calculation (min) (ACUTE ONLY): 15 min  Past Medical History:  Past Medical History:  Diagnosis Date   Arthritis    Cancer (HCC)    breast left   Cerebrovascular accident (HCC)    October 29 2021- Left eye- blind   COLONIC POLYPS, HX OF 12/10/2006   Qualifier: Diagnosis of  By: Cato Mulligan MD, Bruce     Diabetes mellitus type II, controlled (HCC) 12/10/2006   Poor control on Januvia 100mg  and glipizide 10mg  XL Lab Results  Component Value Date   HGBA1C 8.3* 11/02/2014        Eczema    Fatty liver disease, nonalcoholic 07/14/2014   Per Dr. Cato Mulligan Lab Results  Component Value Date   ALT 31 11/02/2014   AST 57* 11/02/2014   ALKPHOS 104 11/02/2014   BILITOT 1.1 11/02/2014       GERD (gastroesophageal reflux disease)    History of blood transfusion    History of kidney stones 2023   11/28/21 small stone currently, taking flomax   Hypertension    Iron deficiency anemia, unspecified 05/08/2023   Sepsis (HCC) 09/19/2017   Sepsis due to Escherichia coli (E. coli) (HCC)    Symptomatic anemia 06/06/2021   UTI (urinary tract infection) 09/19/2017   Vaginal discharge 04/01/2022   Past Surgical History:  Past Surgical History:  Procedure Laterality Date   ARTERY BIOPSY Left 11/29/2021   Procedure: LEFT BIOPSY TEMPORAL ARTERY;  Surgeon: Chuck Hint, MD;  Location: [facility] OR;  Service: Vascular;  Laterality: Left;   CATARACT EXTRACTION Bilateral    LEFT HEART CATH AND CORONARY ANGIOGRAPHY N/A 10/20/2023   Procedure: LEFT HEART CATH AND CORONARY ANGIOGRAPHY;  Surgeon: [place], Peter M, MD;  Location: [facility] INVASIVE CV LAB;  Service: Cardiovascular;  Laterality: N/A;   MASTECTOMY Left    TUBAL LIGATION     HPI:  Anita Mccormick is a 78 yo female presetning to ED 3/18 with  worsening shortness of breath s/p recent COVID-19 infection. Flu A positive. CXR showed acute infiltrates. Syncopal episode with increasing hypoxia and PEA arrest with ROSC after 3 rounds of ACLS while in ED. Subsequently intubated and transferred to ICU. ETT 3/18-3/22. PMH includes tobacco abuse, HTN, HLD, prior CVA, T2DM, CKD II, CHF, anemia, breas cancer, NAFLD/cirrhosis    Assessment / Plan / Recommendation  Clinical Impression  Pt reports odynophagia specifically with acidic foods since being extubated 3/22. Observed pt with trials of thin liquids and regular solids without overt s/s of dysphagia or aspiration. Pt demonstrates the ability to discern which foods minimize pain. Continue current diet with assistance for feeding. Further SLP services are not indicated at this time, will sign off. SLP Visit Diagnosis: Dysphagia, unspecified (R13.10)    Aspiration Risk  Mild aspiration risk    Diet Recommendation Regular;Thin liquid    Liquid Administration via: Cup;Straw Medication Administration: Whole meds with liquid Supervision: Staff to assist with self feeding Compensations: Minimize environmental distractions;Slow rate;Small sips/bites Postural Changes: Seated upright at 90 degrees    Other  Recommendations Oral Care Recommendations: Oral care BID    Recommendations for follow up therapy are one component of a multi-disciplinary discharge planning process, led by the attending physician.  Recommendations may be updated based on patient status, additional functional criteria and insurance authorization.  Follow up  Recommendations No SLP follow up      Assistance Recommended at Discharge    Functional Status Assessment Patient has not had a recent decline in their functional status  Frequency and Duration            Prognosis        Swallow Study   General HPI: Anita Mccormick is a 78 yo female presetning to ED 3/18 with worsening shortness of breath s/p recent COVID-19  infection. Flu A positive. CXR showed acute infiltrates. Syncopal episode with increasing hypoxia and PEA arrest with ROSC after 3 rounds of ACLS while in ED. Subsequently intubated and transferred to ICU. ETT 3/18-3/22. PMH includes tobacco abuse, HTN, HLD, prior CVA, T2DM, CKD II, CHF, anemia, breas cancer, NAFLD/cirrhosis Type of Study: Bedside Swallow Evaluation Previous Swallow Assessment: none in chart Diet Prior to this Study: Regular;Thin liquids (Level 0) Temperature Spikes Noted: No Respiratory Status: Room air History of Recent Intubation: No Behavior/Cognition: Alert;Cooperative;Pleasant mood Oral Cavity Assessment: Within Functional Limits Oral Care Completed by SLP: No Oral Cavity - Dentition: Adequate natural dentition Vision: Functional for self-feeding Self-Feeding Abilities: Needs assist Patient Positioning: Upright in bed Baseline Vocal Quality: Normal Volitional Cough: Congested Volitional Swallow: Able to elicit    Oral/Motor/Sensory Function Overall Oral Motor/Sensory Function: Within functional limits   Ice Chips Ice chips: Not tested   Thin Liquid Thin Liquid: Within functional limits Presentation: Straw    Nectar Thick Nectar Thick Liquid: Not tested   Honey Thick Honey Thick Liquid: Not tested   Puree Puree: Not tested   Solid     Solid: Within functional limits Presentation: Jethro Bastos, M.A., CF-SLP Speech Language Pathology, Acute Rehabilitation Services  Secure Chat preferred [phone]  10/23/2023,1:05 PM

## 2023-10-24 DIAGNOSIS — E1129 Type 2 diabetes mellitus with other diabetic kidney complication: Secondary | ICD-10-CM | POA: Diagnosis not present

## 2023-10-24 DIAGNOSIS — I10 Essential (primary) hypertension: Secondary | ICD-10-CM | POA: Diagnosis not present

## 2023-10-24 DIAGNOSIS — J189 Pneumonia, unspecified organism: Secondary | ICD-10-CM

## 2023-10-24 LAB — GLUCOSE, CAPILLARY
Glucose-Capillary: 177 mg/dL — ABNORMAL HIGH (ref 70–99)
Glucose-Capillary: 283 mg/dL — ABNORMAL HIGH (ref 70–99)
Glucose-Capillary: 159 mg/dL — ABNORMAL HIGH (ref 70–99)
Glucose-Capillary: 220 mg/dL — ABNORMAL HIGH (ref 70–99)

## 2023-10-24 LAB — CBC WITH DIFFERENTIAL/PLATELET
Abs Immature Granulocytes: 0.08 10*3/uL — ABNORMAL HIGH (ref 0.00–0.07)
Basophils Absolute: 0 10*3/uL (ref 0.0–0.1)
Basophils Relative: 0 %
Eosinophils Absolute: 0.3 10*3/uL (ref 0.0–0.5)
Eosinophils Relative: 3 %
HCT: 26.1 % — ABNORMAL LOW (ref 36.0–46.0)
Hemoglobin: 8.5 g/dL — ABNORMAL LOW (ref 12.0–15.0)
Immature Granulocytes: 1 %
Lymphocytes Relative: 9 %
Lymphs Abs: 0.7 10*3/uL (ref 0.7–4.0)
MCH: 34.3 pg — ABNORMAL HIGH (ref 26.0–34.0)
MCHC: 32.6 g/dL (ref 30.0–36.0)
MCV: 105.2 fL — ABNORMAL HIGH (ref 80.0–100.0)
Monocytes Absolute: 0.9 10*3/uL (ref 0.1–1.0)
Monocytes Relative: 11 %
Neutro Abs: 6 10*3/uL (ref 1.7–7.7)
Neutrophils Relative %: 76 %
Platelets: 168 10*3/uL (ref 150–400)
RBC: 2.48 MIL/uL — ABNORMAL LOW (ref 3.87–5.11)
RDW: 14 % (ref 11.5–15.5)
WBC: 7.9 10*3/uL (ref 4.0–10.5)
nRBC: 0 % (ref 0.0–0.2)

## 2023-10-24 LAB — COMPREHENSIVE METABOLIC PANEL WITH GFR
ALT: 45 U/L — ABNORMAL HIGH (ref 0–44)
AST: 45 U/L — ABNORMAL HIGH (ref 15–41)
Albumin: 2.4 g/dL — ABNORMAL LOW (ref 3.5–5.0)
Alkaline Phosphatase: 67 U/L (ref 38–126)
Anion gap: 7 (ref 5–15)
BUN: 5 mg/dL — ABNORMAL LOW (ref 8–23)
CO2: 27 mmol/L (ref 22–32)
Calcium: 8.4 mg/dL — ABNORMAL LOW (ref 8.9–10.3)
Chloride: 106 mmol/L (ref 98–111)
Creatinine, Ser: 0.86 mg/dL (ref 0.44–1.00)
GFR, Estimated: >60 mL/min (ref >60)
Glucose, Bld: 151 mg/dL — ABNORMAL HIGH (ref 70–99)
Potassium: 3.5 mmol/L (ref 3.5–5.1)
Sodium: 140 mmol/L (ref 135–145)
Total Bilirubin: 1.5 mg/dL — ABNORMAL HIGH (ref 0.0–1.2)
Total Protein: 6 g/dL — ABNORMAL LOW (ref 6.5–8.1)

## 2023-10-24 LAB — PROCALCITONIN: Procalcitonin: <0.1 ng/mL

## 2023-10-24 MED ORDER — LOPERAMIDE HCL 2 MG PO CAPS: 4.0000 mg | ORAL_CAPSULE | Freq: Three times a day (TID) | ORAL | Status: DC | PRN

## 2023-10-24 MED ORDER — POTASSIUM CHLORIDE CRYS ER 20 MEQ PO TBCR
40.0000 meq | EXTENDED_RELEASE_TABLET | Freq: Once | ORAL | Status: AC
  Administered 2023-10-24: 40 meq via ORAL
  Filled 2023-10-24: qty 2

## 2023-10-24 MED ORDER — SACCHAROMYCES BOULARDII 250 MG PO CAPS
250.0000 mg | ORAL_CAPSULE | Freq: Two times a day (BID) | ORAL | Status: DC
  Administered 2023-10-24 – 2023-11-11 (×37): 250 mg via ORAL
  Filled 2023-10-24 (×37): qty 1

## 2023-10-24 NOTE — Plan of Care (Signed)
  Problem: Coping: Goal: Ability to adjust to condition or change in health will improve Outcome: Progressing   Problem: Respiratory: Goal: Ability to maintain a clear airway and adequate ventilation will improve Outcome: Progressing   Problem: Nutrition: Goal: Adequate nutrition will be maintained Outcome: Progressing   Problem: Activity: Goal: Risk for activity intolerance will decrease Outcome: Progressing   Problem: Safety: Goal: Ability to remain free from injury will improve Outcome: Progressing

## 2023-10-24 NOTE — Plan of Care (Signed)
  Problem: Tissue Perfusion: Goal: Adequacy of tissue perfusion will improve Outcome: Progressing   Problem: Activity: Goal: Ability to tolerate increased activity will improve Outcome: Progressing   Problem: Respiratory: Goal: Ability to maintain a clear airway and adequate ventilation will improve Outcome: Progressing   Problem: Education: Goal: Knowledge of General Education information will improve Description: Including pain rating scale, medication(s)/side effects and non-pharmacologic comfort measures Outcome: Progressing   Problem: Elimination: Goal: Will not experience complications related to bowel motility Outcome: Progressing Goal: Will not experience complications related to urinary retention Outcome: Progressing   Problem: Clinical Measurements: Goal: Will remain free from infection Outcome: Not Progressing

## 2023-10-24 NOTE — Progress Notes (Signed)
 PROGRESS NOTE    LANIQUA Mccormick  BJY:[id number] DOB: 04-24-1946 DOA: 10/13/2023 PCP: Deeann Saint, MD   Brief Narrative:  78 year old female with history of tobacco abuse, hypertension, hyperlipidemia, unspecified CVA, diabetes mellitus type 2, CKD stage II, chronic diastolic heart failure, anemia, breast cancer, NAFLD/cirrhosis and COVID-19 infection in 08/2023 presented with worsening shortness of breath.  On presentation, she required supplemental oxygen and was found to be influenza A positive; chest x-ray was negative for acute infiltrates.  She was started on Tamiflu.  Subsequently, had a syncopal event and was found to be more hypoxic.  Later on, she was found to be in PEA bradycardic/asystolic arrest with ROSC after 3 rounds of ACLS.  She was subsequently intubated and transferred to ICU.  PCCM and cardiology were consulted.  She was then found to have left pneumothorax for which pigtail catheter was placed.  She also needed vasopressors for shock along with IV antibiotics.  Troponins trended to more than 24,000.  She was started on heparin drip.  Echo showed EF of 60 to 65% with no valvular issues.  She was also started on IV Solu-Medrol by PCCM.  Subsequently, vasopressors were stopped.  She was extubated on 10/17/2023.Chest tube was subsequently removed.  She was transferred to [facility] service from 10/18/2023 onwards.  Cardiology planning for cardiac catheterization today.  Assessment & Plan:   PEA cardiac arrest Non-STEMI History of chronic diastolic heart failure Probable cardiogenic shock: Resolved Coronary artery disease -With ROSC after 3 rounds of CPR. -Required ICU care with vasopressors/ventilatory support/broad-spectrum antibiotics -Subsequently extubated and weaned off vasopressors.  Transferred to [facility] service from 10/18/2023 onwards -Troponins trended to more than 24,000.  Treated with heparin drip and subsequently discontinued.  Echo showed EF of 60 to 65% with no valvular  issues.  Patient was seen by cardiology.  Underwent cardiac catheterization on 3/25.  Triple-vessel disease was noted but none of them amenable to PCI.  Not thought to be a good candidate for bypass surgery.  Medical management is being pursued. She is noted to be on aspirin, amlodipine, statin. No further cardiac workup at this time.  Cardiology has signed off.  Influenza A pneumonia COPD exacerbation Acute respiratory failure with hypoxia -Treated with Tamiflu, steroids for 5 days.  Also completed 5 days of Rocephin.   Has been weaned off of oxygen.   She did not mention any cough with yellowish expectoration and was noted to have low-grade fever with the rise in WBC yesterday.  So chest x-ray was done which raise concern for pneumonia.  Could be sequelae of her influenza of blood.  With her persistent cough this could be a new event.  Seen by speech therapy and no concerns identified for aspiration.  Started on doxycycline.  WBC is better today.  Fever appears to be resolving.  UA noted to be abnormal but patient denies any symptoms of UTI. Stool studies are negative as well.  Imodium and probiotics will be ordered.  Procalcitonin noted to be less than 0.1.  Mildly abnormal LFTs Mildly elevated AST and ALT likely related to her cardiac arrest.  No tenderness in the right upper quadrant.  Levels have improved.  Pneumothorax -Required pigtail catheter placement which has subsequently been removed.  Essential hypertension  Patient is currently on amlodipine and metoprolol.  Dose of amlodipine was increased on 3/28.  Blood pressures are better controlled today.  Continue to monitor.  Syncope Echo and cardiac catheterization as above.  Telemetry with no abnormalities.  Has completed workup.  Thrombocytopenia -Questionable cause.  Monitor.  No signs of bleeding Counts are improving.  Macrocytic anemia Possible iron deficiency anemia. -B12 and folate normal. -IV iron was  ordered.  History of unspecified CVA Hyperlipidemia -No deficits.  Continue aspirin and statin  Diabetes mellitus type 2 with hyperglycemia -Continue CBGs with SSI.  A1c 5  NAFLD/cirrhosis -Appears compensated.  Outpatient follow-up with GI  History of breast cancer -Outpatient follow-up with oncology  Physical deconditioning Seen by PT and OT.  Rehabilitation is being pursued.  DVT prophylaxis: Heparin subcutaneous Code Status: Full Family Communication: No need bedside.  Will update daughter later today. Disposition Plan: SNF is being pursued    Consultants: PCCM/cardiology  Procedures: As above   Subjective: Mentions that her diarrhea has resolved.  Cough is about the same.  No other new complaints offered.  Ports poor appetite but is improving.    Objective: Vitals:   10/24/23 0506 10/24/23 0700 10/24/23 0804 10/24/23 0829  BP:   (!) 147/60   Pulse:   91   Resp: 18 19 14    Temp:   98.3 F (36.8 C)   TempSrc:   Oral   SpO2:   91% 93%  Weight:      Height:        Intake/Output Summary (Last 24 hours) at 10/24/2023 0854 Last data filed at 10/24/2023 0527 Gross per 24 hour  Intake 240 ml  Output 900 ml  Net -660 ml   Filed Weights   10/22/23 0427 10/23/23 0702 10/24/23 0427  Weight: 68.3 kg 67.6 kg 65.2 kg    Examination:  General appearance: Awake alert.  In no distress Resp: Clear to auscultation bilaterally.  Normal effort Cardio: S1-S2 is normal regular.  No S3-S4.  No rubs murmurs or bruit GI: Abdomen is soft.  Nontender nondistended.  Bowel sounds are present normal.  No masses organomegaly     Data Reviewed:   CBC: Recent Labs  Lab 10/18/23 0834 10/19/23 1043 10/20/23 0621 10/21/23 0253 10/23/23 0251 10/23/23 0857 10/24/23 0218  WBC 7.8 8.3 4.7 5.5 10.7*  --  7.9  NEUTROABS 6.7 6.9  --  4.4  --  9.4* 6.0  HGB 9.3* 11.0* 9.1* 8.6* 9.0*  --  8.5*  HCT 28.6* 34.4* 27.5* 26.2* 28.4*  --  26.1*  MCV 107.5* 108.5* 105.0* 104.8*  107.6*  --  105.2*  PLT 76* 106* 111* 124* 168  --  168   Basic Metabolic Panel: Recent Labs  Lab 10/19/23 1043 10/20/23 0621 10/21/23 0253 10/23/23 0251 10/24/23 0218  NA 141 139 140 138 140  K 4.6 3.6 3.5 3.8 3.5  CL 109 107 108 106 106  CO2 22 23 24  21* 27  GLUCOSE 186* 165* 134* 115* 151*  BUN 21 17 12  7* 5*  CREATININE 0.94 0.94 0.93 0.92 0.86  CALCIUM 9.3 8.6* 8.1* 8.3* 8.4*  MG  --   --  1.9 1.8  --   PHOS  --   --   --  2.1*  --    GFR: Estimated Creatinine Clearance: 49.3 mL/min (by C-G formula based on SCr of 0.86 mg/dL).  CBG: Recent Labs  Lab 10/23/23 0626 10/23/23 1201 10/23/23 1514 10/23/23 2107 10/24/23 0558  GLUCAP 143* 163* 211* 139* 177*     Recent Results (from the past 240 hours)  Gastrointestinal Panel by PCR , Stool     Status: None   Collection Time: 10/23/23  9:15 AM   Specimen: Stool  Result Value Ref Range Status   Campylobacter species NOT DETECTED NOT DETECTED Final   Plesimonas shigelloides NOT DETECTED NOT DETECTED Final   Salmonella species NOT DETECTED NOT DETECTED Final   Yersinia enterocolitica NOT DETECTED NOT DETECTED Final   Vibrio species NOT DETECTED NOT DETECTED Final   Vibrio cholerae NOT DETECTED NOT DETECTED Final   Enteroaggregative E coli (EAEC) NOT DETECTED NOT DETECTED Final   Enteropathogenic E coli (EPEC) NOT DETECTED NOT DETECTED Final   Enterotoxigenic E coli (ETEC) NOT DETECTED NOT DETECTED Final   Shiga like toxin producing E coli (STEC) NOT DETECTED NOT DETECTED Final   Shigella/Enteroinvasive E coli (EIEC) NOT DETECTED NOT DETECTED Final   Cryptosporidium NOT DETECTED NOT DETECTED Final   Cyclospora cayetanensis NOT DETECTED NOT DETECTED Final   Entamoeba histolytica NOT DETECTED NOT DETECTED Final   Giardia lamblia NOT DETECTED NOT DETECTED Final   Adenovirus F40/41 NOT DETECTED NOT DETECTED Final   Astrovirus NOT DETECTED NOT DETECTED Final   Norovirus GI/GII NOT DETECTED NOT DETECTED Final    Rotavirus A NOT DETECTED NOT DETECTED Final   Sapovirus (I, II, IV, and V) NOT DETECTED NOT DETECTED Final    Comment: Performed at [facility], [address], [place], [place] [address]       Radiology Studies: DG CHEST PORT 1 VIEW Result Date: 10/23/2023 CLINICAL DATA:  Fever. EXAM: PORTABLE CHEST 1 VIEW COMPARISON:  October 17, 2023. FINDINGS: The heart size and mediastinal contours are within normal limits. Minimal right upper lobe opacity is noted concerning for possible pneumonia. Minimal left basilar atelectasis or infiltrate is noted. The visualized skeletal structures are unremarkable. IMPRESSION: Minimal right upper lobe opacity concerning for possible pneumonia. Minimal left basilar atelectasis or infiltrate is noted. Followup PA and lateral chest X-ray is recommended in 3-4 weeks following trial of antibiotic therapy to ensure resolution and exclude underlying malignancy. Electronically Signed   By: Lupita Raider M.D.   On: 10/23/2023 13:23     Scheduled Meds:  amLODipine  5 mg Oral Daily   aspirin EC  81 mg Oral Daily   atorvastatin  80 mg Oral Daily   budesonide (PULMICORT) nebulizer solution  0.5 mg Nebulization BID   clopidogrel  75 mg Oral Daily   doxycycline  100 mg Oral Q12H   feeding supplement  237 mL Oral BID BM   heparin  5,000 Units Subcutaneous Q8H   insulin aspart  0-15 Units Subcutaneous TID WC   insulin aspart  0-5 Units Subcutaneous QHS   metoprolol succinate  25 mg Oral Daily   multivitamin with minerals  1 tablet Oral Daily   sodium chloride flush  3 mL Intravenous Q12H   sodium chloride flush  3 mL Intravenous Q12H   Continuous Infusions:     Osvaldo Shipper, MD [facility] 10/24/2023, 8:54 AM

## 2023-10-25 DIAGNOSIS — I10 Essential (primary) hypertension: Secondary | ICD-10-CM | POA: Diagnosis not present

## 2023-10-25 DIAGNOSIS — J189 Pneumonia, unspecified organism: Secondary | ICD-10-CM | POA: Diagnosis not present

## 2023-10-25 DIAGNOSIS — E1129 Type 2 diabetes mellitus with other diabetic kidney complication: Secondary | ICD-10-CM | POA: Diagnosis not present

## 2023-10-25 LAB — GLUCOSE, CAPILLARY
Glucose-Capillary: 231 mg/dL — ABNORMAL HIGH (ref 70–99)
Glucose-Capillary: 165 mg/dL — ABNORMAL HIGH (ref 70–99)
Glucose-Capillary: 184 mg/dL — ABNORMAL HIGH (ref 70–99)

## 2023-10-25 MED ORDER — MELATONIN 3 MG PO TABS
3.0000 mg | ORAL_TABLET | Freq: Every evening | ORAL | Status: DC | PRN
  Administered 2023-10-25 – 2023-11-08 (×10): 3 mg via ORAL
  Filled 2023-10-25 (×10): qty 1

## 2023-10-25 MED ORDER — MOMETASONE FURO-FORMOTEROL FUM 200-5 MCG/ACT IN AERO
2.0000 | INHALATION_SPRAY | Freq: Two times a day (BID) | RESPIRATORY_TRACT | Status: DC
  Administered 2023-10-25 – 2023-11-11 (×33): 2 via RESPIRATORY_TRACT
  Filled 2023-10-25: qty 8.8

## 2023-10-25 NOTE — Progress Notes (Signed)
 PROGRESS NOTE    Anita Mccormick  ZOX:[id number] DOB: 16-Jul-1946 DOA: 10/13/2023 PCP: Deeann Saint, MD   Brief Narrative:  78 year old female with history of tobacco abuse, hypertension, hyperlipidemia, unspecified CVA, diabetes mellitus type 2, CKD stage II, chronic diastolic heart failure, anemia, breast cancer, NAFLD/cirrhosis and COVID-19 infection in 08/2023 presented with worsening shortness of breath.  On presentation, she required supplemental oxygen and was found to be influenza A positive; chest x-ray was negative for acute infiltrates.  She was started on Tamiflu.  Subsequently, had a syncopal event and was found to be more hypoxic.  Later on, she was found to be in PEA bradycardic/asystolic arrest with ROSC after 3 rounds of ACLS.  She was subsequently intubated and transferred to ICU.  PCCM and cardiology were consulted.  She was then found to have left pneumothorax for which pigtail catheter was placed.  She also needed vasopressors for shock along with IV antibiotics.  Troponins trended to more than 24,000.  She was started on heparin drip.  Echo showed EF of 60 to 65% with no valvular issues.  She was also started on IV Solu-Medrol by PCCM.  Subsequently, vasopressors were stopped.  She was extubated on 10/17/2023.Chest tube was subsequently removed.  She was transferred to [facility] service from 10/18/2023 onwards.  Cardiology planning for cardiac catheterization today.  Assessment & Plan:   PEA cardiac arrest Non-STEMI History of chronic diastolic heart failure Probable cardiogenic shock: Resolved Coronary artery disease -With ROSC after 3 rounds of CPR. -Required ICU care with vasopressors/ventilatory support/broad-spectrum antibiotics -Subsequently extubated and weaned off vasopressors.  Transferred to [facility] service from 10/18/2023 onwards -Troponins trended to more than 24,000.  Treated with heparin drip and subsequently discontinued.  Echo showed EF of 60 to 65% with no valvular  issues.  Patient was seen by cardiology.  Underwent cardiac catheterization on 3/25.  Triple-vessel disease was noted but none of them amenable to PCI.  Not thought to be a good candidate for bypass surgery.  Medical management is being pursued. She is noted to be on aspirin, amlodipine, statin. No further cardiac workup at this time.  Cardiology has signed off. Seems to be stable from a cardiac standpoint.  Influenza A pneumonia COPD exacerbation Acute respiratory failure with hypoxia -Treated with Tamiflu, steroids for 5 days.  Also completed 5 days of Rocephin.   Has been weaned off of oxygen.   She was noted to have loose stools.  Stool studies are negative as well.  Continue with Imodium and probiotics.  Procalcitonin noted to be less than 0.1. She did mention cough with yellowish expectoration and was noted to have low-grade fever with the rise in WBC yesterday.  So chest x-ray was done which raise concern for pneumonia.  Could be sequelae of her influenza of blood.  With her persistent cough this could also be a new event.  Seen by speech therapy and no concerns identified for aspiration.   She was subsequently started on doxycycline with improvement in WBC.   UA noted to be abnormal but patient denies any symptoms of UTI.  Mildly abnormal LFTs Mildly elevated AST and ALT likely related to her cardiac arrest.  No tenderness in the right upper quadrant.  Levels have improved.  Pneumothorax Required pigtail catheter placement which has subsequently been removed.  Essential hypertension  Patient is currently on amlodipine and metoprolol.  Dose of amlodipine was increased on 3/28.  Blood pressures are better for the most part with occasional high readings.  Continue to monitor for now.  Could potentially increase the dose of her metoprolol as well.  Syncope Echo and cardiac catheterization as above.  Telemetry with no abnormalities.  Has completed  workup.  Thrombocytopenia -Questionable cause.  Resolved  Macrocytic anemia Possible iron deficiency anemia. -B12 and folate normal. -IV iron was ordered.  History of unspecified CVA Hyperlipidemia -No deficits.  Continue aspirin and statin  Diabetes mellitus type 2 with hyperglycemia -Continue CBGs with SSI.  A1c 5  NAFLD/cirrhosis -Appears compensated.  Outpatient follow-up with GI  History of breast cancer -Outpatient follow-up with oncology  Physical deconditioning Seen by PT and OT.  Rehabilitation is being pursued.  DVT prophylaxis: Heparin subcutaneous Code Status: Full Family Communication: No need bedside.  Will update daughter later today. Disposition Plan: SNF is being pursued.  She remains medically stable for discharge.    Consultants: PCCM/cardiology  Procedures: As above   Subjective: Patient sitting up in the chair this morning eating her breakfast.  Feels better.  No new complaints offered.  Objective: Vitals:   10/24/23 2014 10/25/23 0020 10/25/23 0413 10/25/23 0723  BP: (!) 162/57 (!) 126/47 (!) 157/57 (!) 161/71  Pulse: 85 78 90 95  Resp: 20 17 18 20   Temp: 99.5 F (37.5 C) 98.9 F (37.2 C) 99.7 F (37.6 C) 99.4 F (37.4 C)  TempSrc: Oral Oral Oral Oral  SpO2: 95% 96% 96% 94%  Weight:   63.3 kg   Height:        Intake/Output Summary (Last 24 hours) at 10/25/2023 1013 Last data filed at 10/25/2023 0841 Gross per 24 hour  Intake 800 ml  Output 500 ml  Net 300 ml   Filed Weights   10/23/23 0702 10/24/23 0427 10/25/23 0413  Weight: 67.6 kg 65.2 kg 63.3 kg    Examination:  General appearance: Awake alert.  In no distress Resp: Clear to auscultation bilaterally.  Normal effort Cardio: S1-S2 is normal regular.  No S3-S4.  No rubs murmurs or bruit GI: Abdomen is soft.  Nontender nondistended.  Bowel sounds are present normal.  No masses organomegaly    Data Reviewed:   CBC: Recent Labs  Lab 10/19/23 1043 10/20/23 0621  10/21/23 0253 10/23/23 0251 10/23/23 0857 10/24/23 0218  WBC 8.3 4.7 5.5 10.7*  --  7.9  NEUTROABS 6.9  --  4.4  --  9.4* 6.0  HGB 11.0* 9.1* 8.6* 9.0*  --  8.5*  HCT 34.4* 27.5* 26.2* 28.4*  --  26.1*  MCV 108.5* 105.0* 104.8* 107.6*  --  105.2*  PLT 106* 111* 124* 168  --  168   Basic Metabolic Panel: Recent Labs  Lab 10/19/23 1043 10/20/23 0621 10/21/23 0253 10/23/23 0251 10/24/23 0218  NA 141 139 140 138 140  K 4.6 3.6 3.5 3.8 3.5  CL 109 107 108 106 106  CO2 22 23 24  21* 27  GLUCOSE 186* 165* 134* 115* 151*  BUN 21 17 12  7* 5*  CREATININE 0.94 0.94 0.93 0.92 0.86  CALCIUM 9.3 8.6* 8.1* 8.3* 8.4*  MG  --   --  1.9 1.8  --   PHOS  --   --   --  2.1*  --    GFR: Estimated Creatinine Clearance: 48.7 mL/min (by C-G formula based on SCr of 0.86 mg/dL).  CBG: Recent Labs  Lab 10/23/23 2107 10/24/23 0558 10/24/23 1240 10/24/23 1703 10/24/23 2245  GLUCAP 139* 177* 283* 159* 220*     Recent Results (from the past 240 hours)  Gastrointestinal Panel by PCR , Stool     Status: None   Collection Time: 10/23/23  9:15 AM   Specimen: Stool  Result Value Ref Range Status   Campylobacter species NOT DETECTED NOT DETECTED Final   Plesimonas shigelloides NOT DETECTED NOT DETECTED Final   Salmonella species NOT DETECTED NOT DETECTED Final   Yersinia enterocolitica NOT DETECTED NOT DETECTED Final   Vibrio species NOT DETECTED NOT DETECTED Final   Vibrio cholerae NOT DETECTED NOT DETECTED Final   Enteroaggregative E coli (EAEC) NOT DETECTED NOT DETECTED Final   Enteropathogenic E coli (EPEC) NOT DETECTED NOT DETECTED Final   Enterotoxigenic E coli (ETEC) NOT DETECTED NOT DETECTED Final   Shiga like toxin producing E coli (STEC) NOT DETECTED NOT DETECTED Final   Shigella/Enteroinvasive E coli (EIEC) NOT DETECTED NOT DETECTED Final   Cryptosporidium NOT DETECTED NOT DETECTED Final   Cyclospora cayetanensis NOT DETECTED NOT DETECTED Final   Entamoeba histolytica NOT DETECTED  NOT DETECTED Final   Giardia lamblia NOT DETECTED NOT DETECTED Final   Adenovirus F40/41 NOT DETECTED NOT DETECTED Final   Astrovirus NOT DETECTED NOT DETECTED Final   Norovirus GI/GII NOT DETECTED NOT DETECTED Final   Rotavirus A NOT DETECTED NOT DETECTED Final   Sapovirus (I, II, IV, and V) NOT DETECTED NOT DETECTED Final    Comment: Performed at [facility], [address] [place], [place] [address]       Radiology Studies: No results found.    Scheduled Meds:  amLODipine  5 mg Oral Daily   aspirin EC  81 mg Oral Daily   atorvastatin  80 mg Oral Daily   budesonide (PULMICORT) nebulizer solution  0.5 mg Nebulization BID   clopidogrel  75 mg Oral Daily   doxycycline  100 mg Oral Q12H   feeding supplement  237 mL Oral BID BM   heparin  5,000 Units Subcutaneous Q8H   insulin aspart  0-15 Units Subcutaneous TID WC   insulin aspart  0-5 Units Subcutaneous QHS   metoprolol succinate  25 mg Oral Daily   multivitamin with minerals  1 tablet Oral Daily   saccharomyces boulardii  250 mg Oral BID   sodium chloride flush  3 mL Intravenous Q12H   Continuous Infusions:     Osvaldo Shipper, MD [facility] 10/25/2023, 10:13 AM

## 2023-10-26 DIAGNOSIS — J189 Pneumonia, unspecified organism: Secondary | ICD-10-CM | POA: Diagnosis not present

## 2023-10-26 DIAGNOSIS — E1129 Type 2 diabetes mellitus with other diabetic kidney complication: Secondary | ICD-10-CM | POA: Diagnosis not present

## 2023-10-26 DIAGNOSIS — I10 Essential (primary) hypertension: Secondary | ICD-10-CM | POA: Diagnosis not present

## 2023-10-26 LAB — GLUCOSE, CAPILLARY
Glucose-Capillary: 173 mg/dL — ABNORMAL HIGH (ref 70–99)
Glucose-Capillary: 195 mg/dL — ABNORMAL HIGH (ref 70–99)
Glucose-Capillary: 223 mg/dL — ABNORMAL HIGH (ref 70–99)

## 2023-10-26 MED ORDER — BOOST / RESOURCE BREEZE PO LIQD CUSTOM
1.0000 | Freq: Three times a day (TID) | ORAL | Status: DC
  Administered 2023-10-26 – 2023-11-08 (×2): 1 via ORAL

## 2023-10-26 NOTE — TOC Progression Note (Addendum)
 Transition of Care [facility]) - Progression Note    Patient Details  Name: Anita Mccormick MRN: [id number] Date of Birth: [birth year]  [facility] CM/SW Contact  Michaela Corner, [place] Phone Number: 10/26/2023, 11:31 AM  Clinical Narrative:   CSW spoke with Anita Mccormick, about her choice of SNF. Anita Mccormick chose [facility]. Per Phineas Semen, they can accept patient at this time but patients insurance plan requires her to pay a $10 copay for the first 7 days ($70 in total). CSW communicated this to patient and her dtr. Per Anita Mccormick, she will be paying the facility today. CSW to submit for insurance auth once updated PT note is in. CSW notified MD.   11:47 AM Per Phineas Semen, [phone] copay has been paid at this time.   3:53 PM CSW submitted for insurance auth at this time. Auth id [id number].    TOC will continue to follow.    Expected Discharge Plan: Skilled Nursing Facility Barriers to Discharge: Continued Medical Work up  Expected Discharge Plan and Services   Discharge Planning Services: CM Consult Post Acute Care Choice: Skilled Nursing Facility Living arrangements for the past 2 months: Single Family Home                                       Social Determinants of Health (SDOH) Interventions SDOH Screenings   Food Insecurity: No Food Insecurity (10/13/2023)  Housing: Low Risk  (10/13/2023)  Transportation Needs: No Transportation Needs (10/13/2023)  Utilities: Not At Risk (10/13/2023)  Alcohol Screen: Low Risk  (10/03/2022)  Depression (PHQ2-9): Low Risk  (07/06/2023)  Financial Resource Strain: Low Risk  (10/03/2022)  Physical Activity: Inactive (10/03/2022)  Social Connections: Moderately Isolated (10/13/2023)  Stress: No Stress Concern Present (10/03/2022)  Tobacco Use: High Risk (10/13/2023)    Readmission Risk Interventions    10/19/2023   10:43 AM  Readmission Risk Prevention Plan  Post Dischage Appt Complete  Medication Screening Complete  Transportation Screening Complete

## 2023-10-26 NOTE — Progress Notes (Signed)
 Occupational Therapy Treatment Patient Details Name: Anita Mccormick MRN: [id number] DOB: 13-Jun-1946 Today's Date: 10/26/2023   History of present illness Pt is 78 yo presenting to [facility] ED on 3/18 due to worsening shortness of breathe, orthopnea and wheezing with residual cough since COVID. Pt had NSTEMI while in ED with plan for LHC prior to discharge. PMH: HTN, HLD, CVA, DM, CKD II, HFEF, anemia, breast cancer, NAFLD/cirrhosis and COVID 08/2023   OT comments  Pt up in chair with complaints of buttocks discomfort. Completed posterior pericare with total assist and applied barrier cream with pt reporting feeling better. Pt groomed with set up, washed UB with assist for back and changed gown with set up. Stood from chair with supervision and verbal cues for hand placement. Continues to fatigue easily. Patient will benefit from continued inpatient follow up therapy, <3 hours/day.       If plan is discharge home, recommend the following:  A little help with walking and/or transfers;A lot of help with bathing/dressing/bathroom;Assistance with cooking/housework;Assistance with feeding;Assist for transportation;Help with stairs or ramp for entrance   Equipment Recommendations  None recommended by OT    Recommendations for Other Services      Precautions / Restrictions Precautions Precautions: Fall Recall of Precautions/Restrictions: Intact Restrictions Weight Bearing Restrictions Per Provider Order: No       Mobility Bed Mobility               General bed mobility comments: in chair    Transfers Overall transfer level: Needs assistance Equipment used: Rolling walker (2 wheels) Transfers: Sit to/from Stand Sit to Stand: Supervision           General transfer comment: cues for hand placement     Balance Overall balance assessment: Needs assistance   Sitting balance-Leahy Scale: Good     Standing balance support: Single extremity supported, During functional  activity Standing balance-Leahy Scale: Poor Standing balance comment: able to wash under pannus with set up and CGA, unable to maintain standing balance with one hand for posterior pericare                           ADL either performed or assessed with clinical judgement   ADL Overall ADL's : Needs assistance/impaired     Grooming: Wash/dry hands;Wash/dry face;Set up;Sitting   Upper Body Bathing: Minimal assistance;Sitting       Upper Body Dressing : Set up;Sitting           Toileting- Clothing Manipulation and Hygiene: Total assistance;Sit to/from stand Toileting - Clothing Manipulation Details (indicate cue type and reason): for posterior pericare and application of barrier cream            Extremity/Trunk Assessment              Vision       Perception     Praxis     Communication Communication Communication: No apparent difficulties   Cognition Arousal: Alert Behavior During Therapy: WFL for tasks assessed/performed               OT - Cognition Comments: pt participating and motivated to complete ADLs this visit                 Following commands: Intact        Cueing   Cueing Techniques: Verbal cues  Exercises      Shoulder Instructions       General Comments  Pertinent Vitals/ Pain       Pain Assessment Pain Assessment: Faces Faces Pain Scale: Hurts little more Pain Location: buttocks Pain Descriptors / Indicators: Discomfort Pain Intervention(s): Monitored during session, Repositioned, Other (comment) (washed buttocks)  Home Living                                          Prior Functioning/Environment              Frequency  Min 2X/week        Progress Toward Goals  OT Goals(current goals can now be found in the care plan section)  Progress towards OT goals: Progressing toward goals  Acute Rehab OT Goals OT Goal Formulation: With patient Time For Goal Achievement:  11/01/23 Potential to Achieve Goals: Good  Plan      Co-evaluation                 AM-PAC OT "6 Clicks" Daily Activity     Outcome Measure   Help from another person eating meals?: A Little Help from another person taking care of personal grooming?: A Little Help from another person toileting, which includes using toliet, bedpan, or urinal?: A Lot Help from another person bathing (including washing, rinsing, drying)?: A Lot Help from another person to put on and taking off regular upper body clothing?: A Little Help from another person to put on and taking off regular lower body clothing?: Total 6 Click Score: 14    End of Session Equipment Utilized During Treatment: Rolling walker (2 wheels)  OT Visit Diagnosis: Unsteadiness on feet (R26.81);Other abnormalities of gait and mobility (R26.89);Muscle weakness (generalized) (M62.81)   Activity Tolerance Patient tolerated treatment well   Patient Left in chair;with call bell/phone within reach;with chair alarm set   Nurse Communication Mobility status        Time: 1610-9604 OT Time Calculation (min): 19 min  Charges: OT General Charges $OT Visit: 1 Visit OT Treatments $Self Care/Home Management : 8-22 mins  Berna Spare, OTR/L Acute Rehabilitation Services Office: [phone]   Evern Bio 10/26/2023, 11:23 AM

## 2023-10-26 NOTE — Progress Notes (Signed)
 Physical Therapy Treatment Patient Details Name: Anita Mccormick MRN: [id number] DOB: June 04, 1946 Today's Date: 10/26/2023   History of Present Illness Pt is 78 yo presenting to [facility] ED on 3/18 due to worsening shortness of breathe, orthopnea and wheezing with residual cough since COVID. Pt had NSTEMI while in ED with plan for LHC prior to discharge. PMH: HTN, HLD, CVA, DM, CKD II, HFEF, anemia, breast cancer, NAFLD/cirrhosis and COVID 08/2023    PT Comments  Pt greeted seated in recliner chair, pleasant and agreeable to PT session until her lunch arrives. She was brought the wrong lunch order earlier and reports she is very hungry and has been waiting for over an hour. Pt performed seated LAQs for strengthening with a 10 second hold at the top. She ambulated ~61ft using RW with CGA. Pt demonstrated a shuffling gait pattern with her slippers donned. She requires VC/TC for improved technique and sequencing. Patient will benefit from continued inpatient follow up therapy, <3 hours/day. Will continue to follow acutely and advance appropriately.       If plan is discharge home, recommend the following: A little help with walking and/or transfers;Assist for transportation;Help with stairs or ramp for entrance;Assistance with cooking/housework;A little help with bathing/dressing/bathroom   Can travel by private vehicle     Yes  Equipment Recommendations  Rolling walker (2 wheels);BSC/3in1;Wheelchair (measurements PT);Wheelchair cushion (measurements PT)    Recommendations for Other Services       Precautions / Restrictions Precautions Precautions: Fall Recall of Precautions/Restrictions: Intact Restrictions Weight Bearing Restrictions Per Provider Order: No     Mobility  Bed Mobility               General bed mobility comments: Not assessed. Pt greeted in recliner chair and returned there at end of session.    Transfers Overall transfer level: Needs assistance Equipment used: Rolling  walker (2 wheels) Transfers: Sit to/from Stand Sit to Stand: From elevated surface, Supervision           General transfer comment: Pt stood from recliner chair with pillow in its base to raise the surface. She powered up without physical assistance and demonstrated proper hand placement. Good eccentric control with sitting.    Ambulation/Gait Ambulation/Gait assistance: Contact guard assist Gait Distance (Feet): 70 Feet Assistive device: Rolling walker (2 wheels) Gait Pattern/deviations: Step-to pattern, Decreased step length - right, Decreased step length - left, Trunk flexed, Wide base of support, Shuffle Gait velocity: decreased Gait velocity interpretation: <1.31 ft/sec, indicative of household ambulator   General Gait Details: Pt ambulated with short slow steps lacking foot clearence. She maintained fwd flex trunk over RW with AD too far in front. VC/TC to correct posture and bring body inside RW at all times. Pt took wide turns and required multiple steps in order to turn around. She tended to keep body towards the L of RW and had little carryover to stay in the center of the AD. Pt utilized PLB strategies throughout mobility.   Stairs             Wheelchair Mobility     Tilt Bed    Modified Rankin (Stroke Patients Only)       Balance Overall balance assessment: Needs assistance Sitting-balance support: Feet supported, No upper extremity supported Sitting balance-Leahy Scale: Good     Standing balance support: Bilateral upper extremity supported, During functional activity, Reliant on assistive device for balance Standing balance-Leahy Scale: Poor Standing balance comment: Pt dependent on RW for OOB mobility.  Communication Communication Communication: No apparent difficulties  Cognition Arousal: Alert Behavior During Therapy: WFL for tasks assessed/performed   PT - Cognitive impairments: No apparent impairments                          Following commands: Intact      Cueing Cueing Techniques: Verbal cues, Gestural cues  Exercises General Exercises - Lower Extremity Long Arc Quad: Seated, Both, 10 reps, Strengthening (with 10 second hold at top.)    General Comments General comments (skin integrity, edema, etc.): VSS on RA.      Pertinent Vitals/Pain Pain Assessment Pain Assessment: No/denies pain    Home Living                          Prior Function            PT Goals (current goals can now be found in the care plan section) Acute Rehab PT Goals Patient Stated Goal: Go to rehab. Progress towards PT goals: Progressing toward goals    Frequency    Min 1X/week      PT Plan      Co-evaluation              AM-PAC PT "6 Clicks" Mobility   Outcome Measure  Help needed turning from your back to your side while in a flat bed without using bedrails?: A Little Help needed moving from lying on your back to sitting on the side of a flat bed without using bedrails?: A Little Help needed moving to and from a bed to a chair (including a wheelchair)?: A Little Help needed standing up from a chair using your arms (e.g., wheelchair or bedside chair)?: A Little Help needed to walk in hospital room?: A Little Help needed climbing 3-5 steps with a railing? : Total 6 Click Score: 16    End of Session Equipment Utilized During Treatment: Gait belt Activity Tolerance: Patient tolerated treatment well;Other (comment) (Treatment limited secondary to lunch arriving and pt being hungry and ready to eat.) Patient left: in chair;with chair alarm set;with call bell/phone within reach;with family/visitor present (with tray table with lunch set up in front of her.) Nurse Communication: Mobility status PT Visit Diagnosis: Unsteadiness on feet (R26.81);Other abnormalities of gait and mobility (R26.89)     Time: 6301-6010 PT Time Calculation (min) (ACUTE ONLY): 19  min  Charges:    $Gait Training: 8-22 mins PT General Charges $$ ACUTE PT VISIT: 1 Visit                     Cheri Guppy, PT, DPT Acute Rehabilitation Services Office: [phone] Secure Chat Preferred   Richardson Chiquito 10/26/2023, 2:24 PM

## 2023-10-26 NOTE — Progress Notes (Signed)
 PROGRESS NOTE    Anita Mccormick  MVH:[id number] DOB: 06-16-1946 DOA: 10/13/2023 PCP: Deeann Saint, MD   Brief Narrative:  78 year old female with history of tobacco abuse, hypertension, hyperlipidemia, unspecified CVA, diabetes mellitus type 2, CKD stage II, chronic diastolic heart failure, anemia, breast cancer, NAFLD/cirrhosis and COVID-19 infection in 08/2023 presented with worsening shortness of breath.  On presentation, she required supplemental oxygen and was found to be influenza A positive; chest x-ray was negative for acute infiltrates.  She was started on Tamiflu.  Subsequently, had a syncopal event and was found to be more hypoxic.  Later on, she was found to be in PEA bradycardic/asystolic arrest with ROSC after 3 rounds of ACLS.  She was subsequently intubated and transferred to ICU.  PCCM and cardiology were consulted.  She was then found to have left pneumothorax for which pigtail catheter was placed.  She also needed vasopressors for shock along with IV antibiotics.  Troponins trended to more than 24,000.  She was started on heparin drip.  Echo showed EF of 60 to 65% with no valvular issues.  She was also started on IV Solu-Medrol by PCCM.  Subsequently, vasopressors were stopped.  She was extubated on 10/17/2023.Chest tube was subsequently removed.  She was transferred to [facility] service from 10/18/2023 onwards.  Cardiology planning for cardiac catheterization today.  Assessment & Plan:   PEA cardiac arrest Non-STEMI History of chronic diastolic heart failure Probable cardiogenic shock: Resolved Coronary artery disease -With ROSC after 3 rounds of CPR. -Required ICU care with vasopressors/ventilatory support/broad-spectrum antibiotics -Subsequently extubated and weaned off vasopressors.  Transferred to [facility] service from 10/18/2023 onwards -Troponins trended to more than 24,000.  Treated with heparin drip and subsequently discontinued.  Echo showed EF of 60 to 65% with no valvular  issues.  Patient was seen by cardiology.  Underwent cardiac catheterization on 3/25.  Triple-vessel disease was noted but none of them amenable to PCI.  Not thought to be a good candidate for bypass surgery.  Medical management is being pursued. She is noted to be on aspirin, amlodipine, statin. No further cardiac workup at this time.  Cardiology has signed off. Seems to be stable from a cardiac standpoint.  Influenza A pneumonia COPD exacerbation Acute respiratory failure with hypoxia -Treated with Tamiflu, steroids for 5 days.  Also completed 5 days of Rocephin.   Has been weaned off of oxygen.   She was noted to have loose stools.  Stool studies are negative as well.  Continue with Imodium and probiotics.  Procalcitonin noted to be less than 0.1. She did mention cough with yellowish expectoration and was noted to have low-grade fever with the rise in WBC yesterday.  So chest x-ray was done which raise concern for pneumonia.  Could be sequelae of her influenza of blood.  With her persistent cough this could also be a new event.  Seen by speech therapy and no concerns identified for aspiration.   She was subsequently started on doxycycline with improvement in WBC.  Symptoms have also improved.  She is saturating normal on room air.  Will give her a 7-day course of doxycycline. UA noted to be abnormal but patient denies any symptoms of UTI.  Mildly abnormal LFTs Mildly elevated AST and ALT likely related to her cardiac arrest.  No tenderness in the right upper quadrant.  Levels have improved.  Recheck in a few weeks.  Pneumothorax Required pigtail catheter placement which has subsequently been removed.  Essential hypertension  Patient is  currently on amlodipine and metoprolol.  Dose of amlodipine was increased on 3/28.  There is room to go up on the dose of metoprolol as well.  But for now her blood pressure seems to have improved.  Continue current regimen.  Syncope Echo and cardiac  catheterization as above.  Telemetry with no abnormalities.  Has completed workup.  Thrombocytopenia -Questionable cause.  Resolved  Macrocytic anemia Possible iron deficiency anemia. -B12 and folate normal. -IV iron was ordered.  History of unspecified CVA Hyperlipidemia -No deficits.  Continue aspirin and statin  Diabetes mellitus type 2 with hyperglycemia -Continue CBGs with SSI.  A1c 5  NAFLD/cirrhosis -Appears compensated.  Outpatient follow-up with GI  History of breast cancer -Outpatient follow-up with oncology  Physical deconditioning Seen by PT and OT.  Rehabilitation is being pursued.  DVT prophylaxis: Heparin subcutaneous Code Status: Full Family Communication: No need bedside.  Will update daughter later today. Disposition Plan: SNF is being pursued.  She remains medically stable for discharge.    Consultants: PCCM/cardiology  Procedures: As above   Subjective: Patient sitting up in the chair.  Denies any complaints.  Feels weak still.  Cough persists but improving.  Objective: Vitals:   10/25/23 2331 10/26/23 0436 10/26/23 0816 10/26/23 0820  BP: (!) 112/51 (!) 132/49 (!) 132/46   Pulse: 92 88 95 89  Resp: 20 17 20 16   Temp: 98.5 F (36.9 C) 98.7 F (37.1 C) 99.2 F (37.3 C)   TempSrc: Oral Oral Oral   SpO2: 98% 95%  95%  Weight:  59.1 kg    Height:        Intake/Output Summary (Last 24 hours) at 10/26/2023 0956 Last data filed at 10/26/2023 0830 Gross per 24 hour  Intake 714 ml  Output 620 ml  Net 94 ml   Filed Weights   10/24/23 0427 10/25/23 0413 10/26/23 0436  Weight: 65.2 kg 63.3 kg 59.1 kg    Examination:  General appearance: Awake alert.  In no distress Resp: Clear to auscultation bilaterally.  Normal effort Cardio: S1-S2 is normal regular.  No S3-S4.  No rubs murmurs or bruit GI: Abdomen is soft.  Nontender nondistended.  Bowel sounds are present normal.  No masses organomegaly     Data Reviewed:   CBC: Recent Labs   Lab 10/19/23 1043 10/20/23 0621 10/21/23 0253 10/23/23 0251 10/23/23 0857 10/24/23 0218  WBC 8.3 4.7 5.5 10.7*  --  7.9  NEUTROABS 6.9  --  4.4  --  9.4* 6.0  HGB 11.0* 9.1* 8.6* 9.0*  --  8.5*  HCT 34.4* 27.5* 26.2* 28.4*  --  26.1*  MCV 108.5* 105.0* 104.8* 107.6*  --  105.2*  PLT 106* 111* 124* 168  --  168   Basic Metabolic Panel: Recent Labs  Lab 10/19/23 1043 10/20/23 0621 10/21/23 0253 10/23/23 0251 10/24/23 0218  NA 141 139 140 138 140  K 4.6 3.6 3.5 3.8 3.5  CL 109 107 108 106 106  CO2 22 23 24  21* 27  GLUCOSE 186* 165* 134* 115* 151*  BUN 21 17 12  7* 5*  CREATININE 0.94 0.94 0.93 0.92 0.86  CALCIUM 9.3 8.6* 8.1* 8.3* 8.4*  MG  --   --  1.9 1.8  --   PHOS  --   --   --  2.1*  --    GFR: Estimated Creatinine Clearance: 44 mL/min (by C-G formula based on SCr of 0.86 mg/dL).  CBG: Recent Labs  Lab 10/24/23 2245 10/25/23 1056 10/25/23  1547 10/25/23 2107 10/26/23 0539  GLUCAP 220* 231* 165* 184* 173*     Recent Results (from the past 240 hours)  Gastrointestinal Panel by PCR , Stool     Status: None   Collection Time: 10/23/23  9:15 AM   Specimen: Stool  Result Value Ref Range Status   Campylobacter species NOT DETECTED NOT DETECTED Final   Plesimonas shigelloides NOT DETECTED NOT DETECTED Final   Salmonella species NOT DETECTED NOT DETECTED Final   Yersinia enterocolitica NOT DETECTED NOT DETECTED Final   Vibrio species NOT DETECTED NOT DETECTED Final   Vibrio cholerae NOT DETECTED NOT DETECTED Final   Enteroaggregative E coli (EAEC) NOT DETECTED NOT DETECTED Final   Enteropathogenic E coli (EPEC) NOT DETECTED NOT DETECTED Final   Enterotoxigenic E coli (ETEC) NOT DETECTED NOT DETECTED Final   Shiga like toxin producing E coli (STEC) NOT DETECTED NOT DETECTED Final   Shigella/Enteroinvasive E coli (EIEC) NOT DETECTED NOT DETECTED Final   Cryptosporidium NOT DETECTED NOT DETECTED Final   Cyclospora cayetanensis NOT DETECTED NOT DETECTED Final    Entamoeba histolytica NOT DETECTED NOT DETECTED Final   Giardia lamblia NOT DETECTED NOT DETECTED Final   Adenovirus F40/41 NOT DETECTED NOT DETECTED Final   Astrovirus NOT DETECTED NOT DETECTED Final   Norovirus GI/GII NOT DETECTED NOT DETECTED Final   Rotavirus A NOT DETECTED NOT DETECTED Final   Sapovirus (I, II, IV, and V) NOT DETECTED NOT DETECTED Final    Comment: Performed at [facility], [address], [place], [place] [address]       Radiology Studies: No results found.    Scheduled Meds:  amLODipine  5 mg Oral Daily   aspirin EC  81 mg Oral Daily   atorvastatin  80 mg Oral Daily   clopidogrel  75 mg Oral Daily   doxycycline  100 mg Oral Q12H   feeding supplement  237 mL Oral BID BM   heparin  5,000 Units Subcutaneous Q8H   insulin aspart  0-15 Units Subcutaneous TID WC   insulin aspart  0-5 Units Subcutaneous QHS   metoprolol succinate  25 mg Oral Daily   mometasone-formoterol  2 puff Inhalation BID   multivitamin with minerals  1 tablet Oral Daily   saccharomyces boulardii  250 mg Oral BID   sodium chloride flush  3 mL Intravenous Q12H   Continuous Infusions:     Osvaldo Shipper, MD [facility] 10/26/2023, 9:56 AM

## 2023-10-26 NOTE — Progress Notes (Signed)
 Nutrition Follow-up  DOCUMENTATION CODES:   Non-severe (moderate) malnutrition in context of acute illness/injury  INTERVENTION:  Liberalize diet to regular to provide wider variety of menu options to promote adequate PO intake Discontinue Ensure Boost Breeze po TID, each supplement provides 250 kcal and 9 grams of protein Offer small protein containing snacks between meals  NUTRITION DIAGNOSIS:   Moderate Malnutrition related to acute illness as evidenced by mild fat depletion, mild muscle depletion. - remains applicable  GOAL:   Patient will meet greater than or equal to 90% of their needs - unmet, progressing  MONITOR:   PO intake, Supplement acceptance  REASON FOR ASSESSMENT:   Ventilator    ASSESSMENT:   78 y.o female with PMH of breast cancer, HTN, HLD, CVA, left eye blindness, T2DM, FALD/cirrhosis, GERD, anemia, CKD 2, Covid 08/2023. Presented with worsening SOB. Found to be Influenza A positive. While awaiting bed placement had cardiac arrest and was intubated.  3/18 - Intubated  3/20- TF initiated 3/21- extubated, TF d/c 3/22- chest tube d/c  Pt remains medically stable for discharge. TOC working on SNF placement.   RD working remotely. Attempted to follow up with pt via phone call to room however no answer received.   Pt noted to have been having loose stools for which has been treated with imodium and probiotics.   Review  of MAR reflects nutrition supplements have been refused by patient d/t lactose intolerance. Will trial a different oral nutrition supplement to augment PO intake.    Meal completions: 3/29: 75% breakfast, 50% lunch, 50% dinner 3/30: 75% breakfast, 20% lunch, 15% dinner 3/31: 45% breakfast  Admit weight: 67 kg Current weight: 59.1 kg  Medications: doxycycline, SSI 0-15 units TID, SSI 0-5 units at bedtime, MVI, florastor  Labs:  BUN 5 AST 45 ALT 45 CBG's 165-231 x24 hours   Diet Order:   Diet Order             Diet Heart  Room service appropriate? Yes; Fluid consistency: Thin  Diet effective now                   EDUCATION NEEDS:   Not appropriate for education at this time  Skin:  Skin Assessment: Reviewed RN Assessment  Last BM:  3/31  Height:   Ht Readings from Last 1 Encounters:  10/20/23 5\' 2"  (1.575 m)    Weight:   Wt Readings from Last 1 Encounters:  10/26/23 59.1 kg    Ideal Body Weight:  50 kg  BMI:  Body mass index is 23.83 kg/m.  Estimated Nutritional Needs:   Kcal:  1500-1700 kcal  Protein:  75-95 gm  Fluid:  >1.5L/day  Drusilla Kanner, RDN, LDN Clinical Nutrition See AMiON for contact information.

## 2023-10-27 DIAGNOSIS — J9601 Acute respiratory failure with hypoxia: Secondary | ICD-10-CM | POA: Diagnosis not present

## 2023-10-27 LAB — COMPREHENSIVE METABOLIC PANEL WITH GFR
ALT: 32 U/L (ref 0–44)
AST: 28 U/L (ref 15–41)
Albumin: 2.5 g/dL — ABNORMAL LOW (ref 3.5–5.0)
Alkaline Phosphatase: 83 U/L (ref 38–126)
Anion gap: 10 (ref 5–15)
BUN: 7 mg/dL — ABNORMAL LOW (ref 8–23)
CO2: 26 mmol/L (ref 22–32)
Calcium: 8.8 mg/dL — ABNORMAL LOW (ref 8.9–10.3)
Chloride: 105 mmol/L (ref 98–111)
Creatinine, Ser: 0.87 mg/dL (ref 0.44–1.00)
GFR, Estimated: >60 mL/min (ref >60)
Glucose, Bld: 200 mg/dL — ABNORMAL HIGH (ref 70–99)
Potassium: 3.5 mmol/L (ref 3.5–5.1)
Sodium: 141 mmol/L (ref 135–145)
Total Bilirubin: 1.2 mg/dL (ref 0.0–1.2)
Total Protein: 6.5 g/dL (ref 6.5–8.1)

## 2023-10-27 LAB — GLUCOSE, CAPILLARY
Glucose-Capillary: 228 mg/dL — ABNORMAL HIGH (ref 70–99)
Glucose-Capillary: 223 mg/dL — ABNORMAL HIGH (ref 70–99)
Glucose-Capillary: 248 mg/dL — ABNORMAL HIGH (ref 70–99)
Glucose-Capillary: 269 mg/dL — ABNORMAL HIGH (ref 70–99)
Glucose-Capillary: 213 mg/dL — ABNORMAL HIGH (ref 70–99)

## 2023-10-27 MED ORDER — IPRATROPIUM-ALBUTEROL 0.5-2.5 (3) MG/3ML IN SOLN: 3.0000 mL | RESPIRATORY_TRACT | Status: DC | PRN

## 2023-10-27 MED ORDER — FERROUS SULFATE 325 (65 FE) MG PO TBEC
325.0000 mg | DELAYED_RELEASE_TABLET | Freq: Two times a day (BID) | ORAL | Status: DC
Start: 2023-10-27 — End: 2023-10-27

## 2023-10-27 MED ORDER — DOXYCYCLINE HYCLATE 100 MG PO TABS: 100.0000 mg | ORAL_TABLET | Freq: Two times a day (BID) | ORAL | Status: DC

## 2023-10-27 MED ORDER — MELATONIN 3 MG PO TABS: 3.0000 mg | ORAL_TABLET | Freq: Every evening | ORAL | Status: DC | PRN

## 2023-10-27 MED ORDER — ACETAMINOPHEN 325 MG PO TABS: 650.0000 mg | ORAL_TABLET | Freq: Four times a day (QID) | ORAL | Status: AC | PRN

## 2023-10-27 MED ORDER — AMLODIPINE BESYLATE 5 MG PO TABS: 5.0000 mg | ORAL_TABLET | Freq: Every day | ORAL | Status: DC

## 2023-10-27 MED ORDER — SACCHAROMYCES BOULARDII 250 MG PO CAPS: 250.0000 mg | ORAL_CAPSULE | Freq: Two times a day (BID) | ORAL | Status: DC

## 2023-10-27 MED ORDER — LOPERAMIDE HCL 2 MG PO CAPS: 4.0000 mg | ORAL_CAPSULE | Freq: Three times a day (TID) | ORAL | Status: DC | PRN

## 2023-10-27 MED ORDER — MOMETASONE FURO-FORMOTEROL FUM 200-5 MCG/ACT IN AERO: 2.0000 | INHALATION_SPRAY | Freq: Two times a day (BID) | RESPIRATORY_TRACT | Status: DC

## 2023-10-27 MED ORDER — ATORVASTATIN CALCIUM 80 MG PO TABS: 80.0000 mg | ORAL_TABLET | Freq: Every day | ORAL | Status: DC

## 2023-10-27 MED ORDER — POTASSIUM CHLORIDE CRYS ER 20 MEQ PO TBCR
40.0000 meq | EXTENDED_RELEASE_TABLET | Freq: Once | ORAL | Status: AC
  Administered 2023-10-27: 40 meq via ORAL
  Filled 2023-10-27: qty 2

## 2023-10-27 MED ORDER — METOPROLOL SUCCINATE ER 25 MG PO TB24: 25.0000 mg | ORAL_TABLET | Freq: Every day | ORAL | Status: DC

## 2023-10-27 MED ORDER — GUAIFENESIN 100 MG/5ML PO LIQD: 5.0000 mL | ORAL | Status: DC | PRN

## 2023-10-27 MED ORDER — CLOPIDOGREL BISULFATE 75 MG PO TABS: 75.0000 mg | ORAL_TABLET | Freq: Every day | ORAL | Status: DC

## 2023-10-27 MED ORDER — METHOCARBAMOL 500 MG PO TABS: 500.0000 mg | ORAL_TABLET | Freq: Once | ORAL | Status: DC

## 2023-10-27 NOTE — Inpatient Diabetes Management (Signed)
 Inpatient Diabetes Program Recommendations  AACE/ADA: New Consensus Statement on Inpatient Glycemic Control (2015)  Target Ranges:  Prepandial:   less than 140 mg/dL      Peak postprandial:   less than 180 mg/dL (1-2 hours)      Critically ill patients:  140 - 180 mg/dL   Lab Results  Component Value Date   GLUCAP 223 (H) 10/27/2023   HGBA1C 5.0 10/13/2023    Review of Glycemic Control  Latest Reference Range & Units 10/26/23 05:39 10/26/23 10:45 10/26/23 16:02 10/26/23 22:01 10/27/23 06:08  Glucose-Capillary 70 - 99 mg/dL 409 (H) 811 (H) 914 (H) 228 (H) 223 (H)   Diabetes history: DM 2 Outpatient Diabetes medications:  Glucotrol XL 10 mg daily Trulicity 1.5 mg weekly Dexcom G7 Current orders for Inpatient glycemic control:  Novolog 0-15 units tid with meals and HS  Boost tid between meals Glucose trends increase after PO intake  Inpatient Diabetes Program Recommendations:    -   Consider adding Novolog 2 units tid with meals/supplements (hold if patient eats less than 50% or NPO).   Thanks,  Christena Deem RN, MSN, BC-ADM Inpatient Diabetes Coordinator Team Pager [phone] (8a-5p)

## 2023-10-27 NOTE — Discharge Summary (Signed)
 [facility]  Physician Discharge Summary   Patient ID: Anita Mccormick MRN: [id number] DOB/AGE: [birth year] 78 y.o.  Admit date: 10/13/2023 Discharge date:   10/27/2023   PCP: Deeann Saint, MD  DISCHARGE DIAGNOSES:    Type II diabetes mellitus with renal manifestations (HCC)   Hyperlipidemia   Essential hypertension   History of CVA (cerebrovascular accident)   History of breast cancer   Fatty liver disease, nonalcoholic   Chronic kidney disease (CKD), stage II (mild)   Chronic diastolic CHF (congestive heart failure) (HCC)   Anemia   Cirrhosis of liver without ascites (HCC)   Influenza A   Cardiac arrest (HCC)   Pneumothorax on left   Malnutrition of moderate degree   Non-ST elevation (NSTEMI) myocardial infarction (HCC)   RECOMMENDATIONS FOR OUTPATIENT FOLLOW UP: Please check CBC and complete metabolic panel in 3 to 4 days Patient has an appointment with cardiology next week on/03/2024    Home Health: Going to SNF Equipment/Devices: None  CODE STATUS: Full code  DISCHARGE CONDITION: fair  Diet recommendation: Heart healthy  INITIAL HISTORY: 78 year old female with history of tobacco abuse, hypertension, hyperlipidemia, unspecified CVA, diabetes mellitus type 2, CKD stage II, chronic diastolic heart failure, anemia, breast cancer, NAFLD/cirrhosis and COVID-19 infection in 08/2023 presented with worsening shortness of breath. On presentation, she required supplemental oxygen and was found to be influenza A positive; chest x-ray was negative for acute infiltrates. She was started on Tamiflu. Subsequently, had a syncopal event and was found to be more hypoxic. Later on, she was found to be in PEA bradycardic/asystolic arrest with ROSC after 3 rounds of ACLS. She was subsequently intubated and transferred to ICU. PCCM and cardiology were consulted. She was then found to have left pneumothorax for which pigtail catheter was placed. She also needed vasopressors for  shock along with IV antibiotics. Troponins trended to more than 24,000. She was started on heparin drip. Echo showed EF of 60 to 65% with no valvular issues. She was also started on IV Solu-Medrol by PCCM. Subsequently, vasopressors were stopped. She was extubated on 10/17/2023.Chest tube was subsequently removed. She was transferred to [facility] service from 10/18/2023 onwards.   HOSPITAL COURSE:   PEA cardiac arrest Non-STEMI History of chronic diastolic heart failure Probable cardiogenic shock: Resolved Coronary artery disease -With ROSC after 3 rounds of CPR. -Required ICU care with vasopressors/ventilatory support/broad-spectrum antibiotics -Subsequently extubated and weaned off vasopressors.  Transferred to [facility] service from 10/18/2023 onwards -Troponins trended to more than 24,000.  Treated with heparin drip and subsequently discontinued.  Echo showed EF of 60 to 65% with no valvular issues.  Patient was seen by cardiology.  Underwent cardiac catheterization on 3/25.  Triple-vessel disease was noted but none of them amenable to PCI.  Not thought to be a good candidate for bypass surgery.  Medical management is being pursued. She is noted to be on aspirin, amlodipine, statin. No further cardiac workup at this time.  Cardiology has signed off. Seems to be stable from a cardiac standpoint.   Influenza A pneumonia COPD exacerbation Acute respiratory failure with hypoxia -Treated with Tamiflu, steroids for 5 days.  Also completed 5 days of Rocephin.   Has been weaned off of oxygen.   She was noted to have loose stools.  Stool studies are negative as well.  Continue with Imodium and probiotics.  Procalcitonin noted to be less than 0.1. She did mention cough with yellowish expectoration and was noted to have low-grade fever with the rise  in WBC yesterday.  So chest x-ray was done which raise concern for pneumonia.  Could be sequelae of her influenza of blood.  With her persistent cough this could also  be a new event.  Seen by speech therapy and no concerns identified for aspiration.   She was subsequently started on doxycycline with improvement in WBC.  Symptoms have also improved.  She is saturating normal on room air.  Will give her a 7-day course of doxycycline. UA noted to be abnormal but patient denies any symptoms of UTI.   Mildly abnormal LFTs Mildly elevated AST and ALT likely related to her cardiac arrest.  No tenderness in the right upper quadrant.  Levels have improved.     Pneumothorax Required pigtail catheter placement which has subsequently been removed.   Essential hypertension  Patient is currently on amlodipine and metoprolol.  Dose of amlodipine was increased on 3/28.  There is room to go up on the dose of metoprolol as well.  But for now her blood pressure seems to have improved.  Continue current regimen.   Syncope Echo and cardiac catheterization as above.  Telemetry with no abnormalities.  Has completed workup.   Thrombocytopenia -Questionable cause.  Resolved   Macrocytic anemia Possible iron deficiency anemia. -B12 and folate normal. -IV iron was ordered. Continue iron supplements.   History of unspecified CVA Hyperlipidemia -No deficits.  Continue aspirin and statin   Diabetes mellitus type 2 with hyperglycemia A1c 5.  Resume home medications at discharge.   NAFLD/cirrhosis -Appears compensated.  Outpatient follow-up with GI   History of breast cancer -Outpatient follow-up with oncology   Physical deconditioning Seen by PT and OT.  Rehabilitation is being pursued.  Moderate protein calorie malnutrition Nutrition Problem: Moderate Malnutrition Etiology: acute illness  Patient stable.  Okay for discharge to SNF when bed is available.  PERTINENT LABS:  The results of significant diagnostics from this hospitalization (including imaging, microbiology, ancillary and laboratory) are listed below for reference.    Microbiology: Recent Results  (from the past 240 hours)  Gastrointestinal Panel by PCR , Stool     Status: None   Collection Time: 10/23/23  9:15 AM   Specimen: Stool  Result Value Ref Range Status   Campylobacter species NOT DETECTED NOT DETECTED Final   Plesimonas shigelloides NOT DETECTED NOT DETECTED Final   Salmonella species NOT DETECTED NOT DETECTED Final   Yersinia enterocolitica NOT DETECTED NOT DETECTED Final   Vibrio species NOT DETECTED NOT DETECTED Final   Vibrio cholerae NOT DETECTED NOT DETECTED Final   Enteroaggregative E coli (EAEC) NOT DETECTED NOT DETECTED Final   Enteropathogenic E coli (EPEC) NOT DETECTED NOT DETECTED Final   Enterotoxigenic E coli (ETEC) NOT DETECTED NOT DETECTED Final   Shiga like toxin producing E coli (STEC) NOT DETECTED NOT DETECTED Final   Shigella/Enteroinvasive E coli (EIEC) NOT DETECTED NOT DETECTED Final   Cryptosporidium NOT DETECTED NOT DETECTED Final   Cyclospora cayetanensis NOT DETECTED NOT DETECTED Final   Entamoeba histolytica NOT DETECTED NOT DETECTED Final   Giardia lamblia NOT DETECTED NOT DETECTED Final   Adenovirus F40/41 NOT DETECTED NOT DETECTED Final   Astrovirus NOT DETECTED NOT DETECTED Final   Norovirus GI/GII NOT DETECTED NOT DETECTED Final   Rotavirus A NOT DETECTED NOT DETECTED Final   Sapovirus (I, II, IV, and V) NOT DETECTED NOT DETECTED Final    Comment: Performed at [facility], [address], [place], [place] [address]     Labs:  Basic Metabolic Panel: Recent Labs  Lab 10/21/23 0253 10/23/23 0251 10/24/23 0218 10/27/23 0342  NA 140 138 140 141  K 3.5 3.8 3.5 3.5  CL 108 106 106 105  CO2 24 21* 27 26  GLUCOSE 134* 115* 151* 200*  BUN 12 7* 5* 7*  CREATININE 0.93 0.92 0.86 0.87  CALCIUM 8.1* 8.3* 8.4* 8.8*  MG 1.9 1.8  --   --   PHOS  --  2.1*  --   --    Liver Function Tests: Recent Labs  Lab 10/23/23 0251 10/24/23 0218 10/27/23 0342  AST 51* 45* 28  ALT 47* 45* 32  ALKPHOS 61 67 83  BILITOT 1.3* 1.5*  1.2  PROT 5.9* 6.0* 6.5  ALBUMIN 2.4* 2.4* 2.5*    CBC: Recent Labs  Lab 10/21/23 0253 10/23/23 0251 10/23/23 0857 10/24/23 0218  WBC 5.5 10.7*  --  7.9  NEUTROABS 4.4  --  9.4* 6.0  HGB 8.6* 9.0*  --  8.5*  HCT 26.2* 28.4*  --  26.1*  MCV 104.8* 107.6*  --  105.2*  PLT 124* 168  --  168   BNP: BNP (last 3 results) Recent Labs    10/13/23 0944 10/13/23 1853  BNP 205.1* 1,372.6*   CBG: Recent Labs  Lab 10/26/23 0539 10/26/23 1045 10/26/23 1602 10/26/23 2201 10/27/23 0608  GLUCAP 173* 195* 223* 228* 223*     IMAGING STUDIES DG CHEST PORT 1 VIEW Result Date: 10/23/2023 CLINICAL DATA:  Fever. EXAM: PORTABLE CHEST 1 VIEW COMPARISON:  October 17, 2023. FINDINGS: The heart size and mediastinal contours are within normal limits. Minimal right upper lobe opacity is noted concerning for possible pneumonia. Minimal left basilar atelectasis or infiltrate is noted. The visualized skeletal structures are unremarkable. IMPRESSION: Minimal right upper lobe opacity concerning for possible pneumonia. Minimal left basilar atelectasis or infiltrate is noted. Followup PA and lateral chest X-ray is recommended in 3-4 weeks following trial of antibiotic therapy to ensure resolution and exclude underlying malignancy. Electronically Signed   By: Lupita Raider M.D.   On: 10/23/2023 13:23   CARDIAC CATHETERIZATION Result Date: 10/20/2023 3 vessel obstructive CAD. Suspect culprit vessel is the LCx. Low LVEDP 4 mm Hg Plan: recommend medical therapy. She is not having significant angina. She is currently a poor candidate for CABG. Poor targets for PCI.   DG CHEST PORT 1 VIEW Result Date: 10/17/2023 CLINICAL DATA:  Pneumothorax. EXAM: PORTABLE CHEST 1 VIEW COMPARISON:  10/13/2023 FINDINGS: Interval extubation and NG tube removal. Right IJ central line tip overlies the upper right atrium. Left pleural drain remains in place has been repositioned in the interval. No discernible left-sided pneumothorax.  Soft tissue gas in the lateral left chest wall seen previously has decreased in the interval. Telemetry leads overlie the chest. IMPRESSION: 1. Interval extubation and NG tube removal. 2. Left pleural drain remains in place without discernible pneumothorax. Electronically Signed   By: Kennith Center M.D.   On: 10/17/2023 08:48   CT HEAD WO CONTRAST ( ) Result Date: 10/15/2023 CLINICAL DATA:  Cardiac arrest. EXAM: CT HEAD WITHOUT CONTRAST TECHNIQUE: Contiguous axial images were obtained from the base of the skull through the vertex without intravenous contrast. RADIATION DOSE REDUCTION: This exam was performed according to the departmental dose-optimization program which includes automated exposure control, adjustment of the mA and/or kV according to patient size and/or use of iterative reconstruction technique. COMPARISON:  CTA head and neck 10/30/2021 FINDINGS: Brain: There is no evidence of an acute  large territory infarct, intracranial hemorrhage, cerebral edema, mass, midline shift, or extra-axial fluid collection. Cerebral volume is within normal limits for age. The ventricles are normal in size. Cerebral white matter hypodensities in the cerebral white matter have mildly progressed and are nonspecific but compatible with mild chronic small vessel ischemic disease. There is a new age indeterminate lacunar infarct in the right external capsule region. Vascular: Calcified atherosclerosis at the skull base. No hyperdense vessel. Skull: No acute fracture or suspicious lesion. Sinuses/Orbits: Moderate left ethmoid and left maxillary sinus mucosal thickening. Fluid in the left maxillary and bilateral sphenoid sinuses which may be related to intubation. Clear mastoid air cells. Bilateral cataract extraction. Other: None. IMPRESSION: 1. No CT findings of diffuse hypoxic ischemic injury, acute large territory infarct, or intracranial hemorrhage. 2. Age indeterminate lacunar infarct in the right external capsule. 3.  Mild chronic small vessel ischemic disease. Electronically Signed   By: Sebastian Ache M.D.   On: 10/15/2023 14:48   ECHOCARDIOGRAM COMPLETE Result Date: 10/14/2023    ECHOCARDIOGRAM REPORT   Patient Name:   ZHANAE PROFFIT Date of Exam: 10/14/2023 Medical Rec #:  [id number]      Height:       62.0 in Accession #:    [id number]     Weight:       147.7 lb Date of Birth:  [birth year]       BSA:          1.681 m Patient Age:    77 years       BP:           83/63 mmHg Patient Gender: F              HR:           67 bpm. Exam Location:  Inpatient Procedure: 2D Echo, Color Doppler and Cardiac Doppler (Both Spectral and Color            Flow Doppler were utilized during procedure). Indications:    R55 Syncope  History:        Patient has prior history of Echocardiogram examinations, most                 recent 06/07/2021. Abnormal ECG, Arrythmias:Cardiac Arrest; Risk                 Factors:Hypertension, Diabetes, Dyslipidemia and Current Smoker.                 FLU positive. Breast cancer. Left pneumothorax. Chest tube.  Sonographer:    Sheralyn Boatman RDCS Referring Phys: Cecille Po [facility]  Sonographer Comments: Technically difficult study due to poor echo windows, Technically challenging study due to limited acoustic windows, no apical window and echo performed with patient supine and on artificial respirator. Image acquisition challenging  due to mastectomy. No apical images. IMPRESSIONS  1. Left ventricular ejection fraction, by estimation, is 60 to 65%. The left ventricle has normal function. The left ventricle has no regional wall motion abnormalities. Left ventricular diastolic parameters are indeterminate.  2. Right ventricular systolic function is normal. The right ventricular size is normal. There is normal pulmonary artery systolic pressure. The estimated right ventricular systolic pressure is 15.5 mmHg.  3. The mitral valve is normal in structure. Trivial mitral valve regurgitation. No evidence of mitral stenosis.  Moderate mitral annular calcification.  4. The aortic valve is normal in structure. Aortic valve regurgitation is not visualized. No aortic stenosis is present.  5. The inferior vena cava  is normal in size with greater than 50% respiratory variability, suggesting right atrial pressure of 3 mmHg. FINDINGS  Left Ventricle: Left ventricular ejection fraction, by estimation, is 60 to 65%. The left ventricle has normal function. The left ventricle has no regional wall motion abnormalities. The left ventricular internal cavity size was normal in size. There is  no left ventricular hypertrophy. Left ventricular diastolic parameters are indeterminate. Right Ventricle: The right ventricular size is normal. No increase in right ventricular wall thickness. Right ventricular systolic function is normal. There is normal pulmonary artery systolic pressure. The tricuspid regurgitant velocity is 1.77 m/s, and  with an assumed right atrial pressure of 3 mmHg, the estimated right ventricular systolic pressure is 15.5 mmHg. Left Atrium: Left atrial size was normal in size. Right Atrium: Right atrial size was normal in size. Pericardium: There is no evidence of pericardial effusion. Mitral Valve: The mitral valve is normal in structure. Moderate mitral annular calcification. Trivial mitral valve regurgitation. No evidence of mitral valve stenosis. Tricuspid Valve: The tricuspid valve is normal in structure. Tricuspid valve regurgitation is trivial. No evidence of tricuspid stenosis. Aortic Valve: The aortic valve is normal in structure. Aortic valve regurgitation is not visualized. No aortic stenosis is present. Pulmonic Valve: The pulmonic valve was normal in structure. Pulmonic valve regurgitation is not visualized. No evidence of pulmonic stenosis. Aorta: The aortic root is normal in size and structure. Venous: The inferior vena cava is normal in size with greater than 50% respiratory variability, suggesting right atrial pressure of  3 mmHg. IAS/Shunts: No atrial level shunt detected by color flow Doppler.  LEFT VENTRICLE PLAX 2D LVIDd:         4.40 cm LVIDs:         3.40 cm LV PW:         0.90 cm LV IVS:        0.80 cm LVOT diam:     1.90 cm LVOT Area:     2.84 cm  IVC IVC diam: 2.00 cm LEFT ATRIUM         Index LA diam:    3.90 cm 2.32 cm/m   AORTA Ao Asc diam: 3.20 cm TRICUSPID VALVE TR Peak grad:   12.5 mmHg TR Vmax:        177.00 cm/s  SHUNTS Systemic Diam: 1.90 cm Chilton Si MD Electronically signed by Chilton Si MD Signature Date/Time: 10/14/2023/3:10:05 PM    Final    DG Chest Port 1 View Result Date: 10/14/2023 CLINICAL DATA:  Left pneumothorax, chest tube EXAM: PORTABLE CHEST 1 VIEW COMPARISON:  10/13/2023 FINDINGS: Interval placement of left chest tube with re-expansion of the left lung. No visible pneumothorax. Subcutaneous emphysema in the left chest wall. No confluent airspace opacities. Endotracheal tube, right central line and NG tube are unchanged. IMPRESSION: Interval placement of left chest tube with re-expansion of the left lung. No visible residual pneumothorax. Left chest wall subcutaneous emphysema. Electronically Signed   By: Charlett Nose M.D.   On: 10/14/2023 00:18   DG CHEST PORT 1 VIEW Result Date: 10/13/2023 CLINICAL DATA:  Encounter for central line placement. Follow-up left pneumothorax. [id number]. EXAM: PORTABLE CHEST 1 VIEW COMPARISON:  PA and lateral chest earlier today, more recent portable chest today at 6:28 p.m. FINDINGS: 9:06 p.m. ETT tip is 3.1 cm from the carina. NGT likely terminates in the body of stomach based on the position of the side hole although the tip is not in the field. There is a new right  IJ central line terminating about the superior cavoatrial junction. No right pneumothorax. A left pneumothorax is again noted and extends from the apex to the base. There is evidence of tension pneumothorax with depression of the left hemidiaphragm and slight shift of mediastinum to the  right. The pneumothorax is estimated up to 30% of the left chest volume. This is not notably changed from the last film. No pleural fluid is seen. There is patchy right upper lobe airspace disease again noted with remaining lungs clear. The cardiomediastinal silhouette and central vessels are normal. There is calcification of the transverse aorta. Advanced degenerative change thoracic spine. Recent fracture posterolateral left sixth rib is again noted. IMPRESSION: 1. New right IJ central line terminating about the superior cavoatrial junction. No right pneumothorax. 2. Left tension pneumothorax estimated up to 30% of the left chest volume, with depressed left hemidiaphragm and slight mediastinal shift. This is not notably changed from the last film. 3. Patchy right upper lobe airspace disease. 4. ETT and NGT as above. 5. Recent fracture posterolateral left sixth rib. 6. These results will be called to the ordering clinician or representative by the Radiologist Assistant, and communication documented in the PACS or [employer]. Electronically Signed   By: Almira Bar M.D.   On: 10/13/2023 22:19   DG Chest Portable 1 View Result Date: 10/13/2023 CLINICAL DATA:  Status post intubation. EXAM: PORTABLE CHEST 1 VIEW COMPARISON:  October 13, 2023 (10:08 a.m.) FINDINGS: Endotracheal tube is seen with its distal tip approximately 5.3 cm from the carina. An enteric tube is noted with its distal end extending below the level of the diaphragm. The heart size and mediastinal contours are within normal limits. Mild, hazy airspace disease is seen overlying the upper right lung. This represents a new finding when compared to the prior study. No pleural effusion is identified. There is a small left-sided pneumothorax. This extends from the left apex to the lower left lung and represents a new finding. Acute sixth left rib fracture is seen. IMPRESSION: 1. Endotracheal tube and enteric tube positioning, as described above. 2.  Small left-sided pneumothorax. 3. Mild, hazy right upper lobe airspace disease. 4. Acute sixth left rib fracture. Electronically Signed   By: Aram Candela M.D.   On: 10/13/2023 19:54   DG Chest 2 View Result Date: 10/13/2023 CLINICAL DATA:  Shortness of breath.  Cough EXAM: CHEST - 2 VIEW COMPARISON:  X-ray 07/06/2023. FINDINGS: No consolidation, pneumothorax or effusion. Normal cardiopericardial silhouette. Calcified aorta. Overlapping cardiac leads. Degenerative changes seen along the spine. The extreme inferior posterior costophrenic angles are clipped off the edge of the film on the lateral view. IMPRESSION: No acute cardiopulmonary disease. Electronically Signed   By: Karen Kays M.D.   On: 10/13/2023 11:06    DISCHARGE EXAMINATION: Vitals:   10/26/23 2033 10/27/23 0004 10/27/23 0501 10/27/23 0735  BP: (!) 126/48 132/79 (!) 129/50 (!) 131/51  Pulse: 74 90 83 88  Resp: 19 19 19 20   Temp: 98.2 F (36.8 C) 99.3 F (37.4 C) 98.1 F (36.7 C) 99.1 F (37.3 C)  TempSrc: Oral Oral Oral Oral  SpO2: 100% 100% 97% 100%  Weight:   62.6 kg   Height:       General appearance: Awake alert.  In no distress Resp: Normal effort at rest.  Coarse breath sounds with few crackles at the bases.  No wheezing or rhonchi. Cardio: S1-S2 is normal regular.  No S3-S4.  No rubs murmurs or bruit GI: Abdomen is  soft.  Nontender nondistended.  Bowel sounds are present normal.  No masses organomegaly Extremities: No edema.  Full range of motion of lower extremities. Neurologic: Alert and oriented x3.  No focal neurological deficits.    DISPOSITION: SNF  Discharge Instructions     Call MD for:  difficulty breathing, headache or visual disturbances   Complete by: As directed    Call MD for:  extreme fatigue   Complete by: As directed    Call MD for:  persistant dizziness or light-headedness   Complete by: As directed    Call MD for:  persistant nausea and vomiting   Complete by: As directed    Call  MD for:  severe uncontrolled pain   Complete by: As directed    Call MD for:  temperature >100.4   Complete by: As directed    Diet - low sodium heart healthy   Complete by: As directed    Discharge instructions   Complete by: As directed    Please review instructions on the discharge summary.  You were cared for by a hospitalist during your hospital stay. If you have any questions about your discharge medications or the care you received while you were in the hospital after you are discharged, you can call the unit and asked to speak with the hospitalist on call if the hospitalist that took care of you is not available. Once you are discharged, your primary care physician will handle any further medical issues. Please note that NO REFILLS for any discharge medications will be authorized once you are discharged, as it is imperative that you return to your primary care physician (or establish a relationship with a primary care physician if you do not have one) for your aftercare needs so that they can reassess your need for medications and monitor your lab values. If you do not have a primary care physician, you can call [phone] for a physician referral.   Increase activity slowly   Complete by: As directed         Allergies as of 10/27/2023       Reactions   Bee Venom Swelling   Penicillins Hives   Ace Inhibitors Cough   ????   Chlorthalidone    REACTION: unspecified   Lactose Intolerance (gi) Diarrhea   Metformin    REACTION: gi side effects   Sulfamethoxazole    REACTION: questionable        Medication List     STOP taking these medications    atenolol 100 MG tablet Commonly known as: TENORMIN   benzonatate 100 MG capsule Commonly known as: TESSALON   naproxen sodium 220 MG tablet Commonly known as: ALEVE   Trulicity 1.5 MG/0.5ML Soaj Generic drug: Dulaglutide       TAKE these medications    acetaminophen 325 MG tablet Commonly known as: TYLENOL Take 2  tablets (650 mg total) by mouth every 6 (six) hours as needed for mild pain (pain score 1-3) (or Fever >/= 101).   amLODipine 5 MG tablet Commonly known as: NORVASC Take 1 tablet (5 mg total) by mouth daily.   aspirin EC 81 MG tablet Take 81 mg by mouth daily. Swallow whole.   atorvastatin 80 MG tablet Commonly known as: LIPITOR Take 1 tablet (80 mg total) by mouth daily.   blood glucose meter kit and supplies Kit Dispense based on patient and insurance preference. Use up to four times daily as directed.   clopidogrel 75 MG tablet Commonly known  as: PLAVIX Take 1 tablet (75 mg total) by mouth daily.   Dexcom G7 Sensor Misc Apply a new sensor to skin every 10 days.   doxycycline 100 MG tablet Commonly known as: VIBRA-TABS Take 1 tablet (100 mg total) by mouth every 12 (twelve) hours for 4 days.   ferrous gluconate 324 MG tablet Commonly known as: FERGON Take 1 tablet (324 mg total) by mouth daily.   glipiZIDE 10 MG 24 hr tablet Commonly known as: GLUCOTROL XL TAKE 1 TABLET BY MOUTH TWICE A DAY   guaiFENesin 100 MG/5ML liquid Commonly known as: ROBITUSSIN Take 5 mLs by mouth every 4 (four) hours as needed for cough or to loosen phlegm.   ipratropium-albuterol 0.5-2.5 (3) MG/3ML Soln Commonly known as: DUONEB Take 3 mLs by nebulization every 4 (four) hours as needed.   loperamide 2 MG capsule Commonly known as: IMODIUM Take 2 capsules (4 mg total) by mouth 3 (three) times daily as needed for diarrhea or loose stools.   melatonin 3 MG Tabs tablet Take 1 tablet (3 mg total) by mouth at bedtime as needed.   metoprolol succinate 25 MG 24 hr tablet Commonly known as: TOPROL-XL Take 1 tablet (25 mg total) by mouth daily.   mometasone-formoterol 200-5 MCG/ACT Aero Commonly known as: DULERA Inhale 2 puffs into the lungs 2 (two) times daily.   multivitamin tablet Take 1 tablet by mouth daily.   onetouch ultrasoft lancets Use as instructed   saccharomyces boulardii  250 MG capsule Commonly known as: FLORASTOR Take 1 capsule (250 mg total) by mouth 2 (two) times daily.          Contact information for follow-up providers     Joylene Grapes, NP Follow up on 11/04/2023.   Specialties: Cardiology, Family Medicine Why: at 2:20pm Contact information: [address] [place] [address] [phone]              Contact information for after-discharge care     Destination     [facility] Preferred SNF .   Service: Skilled Nursing Contact information: [address] [place] [address] [phone]                     TOTAL DISCHARGE TIME: 35 mins  Querida Beretta Rito Ehrlich  [facility] Pager on [email or web address]  10/27/2023, 8:46 AM

## 2023-10-27 NOTE — TOC Progression Note (Addendum)
 Transition of Care [facility]) - Progression Note    Patient Details  Name: Anita Mccormick MRN: [id number] Date of Birth: Jan 25, 1946  Transition of Care [facility]) CM/SW Contact  Michaela Corner, [place] Phone Number: 10/27/2023, 8:55 AM  Clinical Narrative:   Berk[facility] Harvey pending at this time.   11:32 AM Auth still pending at this time.   2:31 PM Auth is still pending at this time.  4:05 PM CSW checked insurance auth at this time and it is still pending.   TOC will continue to follow.    Expected Discharge Plan: Skilled Nursing Facility Barriers to Discharge: Continued Medical Work up  Expected Discharge Plan and Services   Discharge Planning Services: CM Consult Post Acute Care Choice: Skilled Nursing Facility Living arrangements for the past 2 months: Single Family Home                                       Social Determinants of Health (SDOH) Interventions SDOH Screenings   Food Insecurity: No Food Insecurity (10/13/2023)  Housing: Low Risk  (10/13/2023)  Transportation Needs: No Transportation Needs (10/13/2023)  Utilities: Not At Risk (10/13/2023)  Alcohol Screen: Low Risk  (10/03/2022)  Depression (PHQ2-9): Low Risk  (07/06/2023)  Financial Resource Strain: Low Risk  (10/03/2022)  Physical Activity: Inactive (10/03/2022)  Social Connections: Moderately Isolated (10/13/2023)  Stress: No Stress Concern Present (10/03/2022)  Tobacco Use: High Risk (10/13/2023)    Readmission Risk Interventions    10/19/2023   10:43 AM  Readmission Risk Prevention Plan  Post Dischage Appt Complete  Medication Screening Complete  Transportation Screening Complete

## 2023-10-28 ENCOUNTER — Encounter

## 2023-10-28 ENCOUNTER — Telehealth: Payer: Self-pay

## 2023-10-28 ENCOUNTER — Other Ambulatory Visit (HOSPITAL_COMMUNITY): Payer: Self-pay

## 2023-10-28 DIAGNOSIS — J9601 Acute respiratory failure with hypoxia: Secondary | ICD-10-CM | POA: Diagnosis not present

## 2023-10-28 LAB — GLUCOSE, CAPILLARY
Glucose-Capillary: 179 mg/dL — ABNORMAL HIGH (ref 70–99)
Glucose-Capillary: 323 mg/dL — ABNORMAL HIGH (ref 70–99)
Glucose-Capillary: 211 mg/dL — ABNORMAL HIGH (ref 70–99)
Glucose-Capillary: 172 mg/dL — ABNORMAL HIGH (ref 70–99)

## 2023-10-28 NOTE — Telephone Encounter (Signed)
 Unsuccessful attempts to reach patient on preferred number listed in notes for scheduled AWV. Left message on voicemail okay to reschedule.

## 2023-10-28 NOTE — TOC Progression Note (Signed)
 Transition of Care [facility]) - Progression Note    Patient Details  Name: Anita Mccormick MRN: [id number] Date of Birth: Apr 19, 1946  Transition of Care [facility]) CM/SW Contact  Leone Haven, RN Phone Number: 10/28/2023, 10:31 AM  Clinical Narrative:    NCM was informed that peer to peer was denied by insurance company.  NCM spoke with patient and offered choice for [facility] services at home, she states to call her daughter, [name] [phone].  NCM contacted her , she states she may want to do an expedited appeal, she states she has not had an update on how her mother is doing.  NCM informed CSW and MD .  CSW to give daughter information for expedited appeal for SNF.    Expected Discharge Plan: Skilled Nursing Facility Barriers to Discharge: Continued Medical Work up  Expected Discharge Plan and Services   Discharge Planning Services: CM Consult Post Acute Care Choice: Skilled Nursing Facility Living arrangements for the past 2 months: Single Family Home                                       Social Determinants of Health (SDOH) Interventions SDOH Screenings   Food Insecurity: No Food Insecurity (10/13/2023)  Housing: Low Risk  (10/13/2023)  Transportation Needs: No Transportation Needs (10/13/2023)  Utilities: Not At Risk (10/13/2023)  Alcohol Screen: Low Risk  (10/03/2022)  Depression (PHQ2-9): Low Risk  (07/06/2023)  Financial Resource Strain: Low Risk  (10/03/2022)  Physical Activity: Inactive (10/03/2022)  Social Connections: Moderately Isolated (10/13/2023)  Stress: No Stress Concern Present (10/03/2022)  Tobacco Use: High Risk (10/13/2023)    Readmission Risk Interventions    10/19/2023   10:43 AM  Readmission Risk Prevention Plan  Post Dischage Appt Complete  Medication Screening Complete  Transportation Screening Complete

## 2023-10-28 NOTE — Progress Notes (Signed)
 Progress Note   Patient: Anita Mccormick:[id number] DOB: March 31, 1946 DOA: 10/13/2023     15 DOS: the patient was seen and examined on 10/28/2023   Brief hospital course: 78 year old female with history of tobacco abuse, hypertension, hyperlipidemia, unspecified CVA, diabetes mellitus type 2, CKD stage II, chronic diastolic heart failure, anemia, breast cancer, NAFLD/cirrhosis and COVID-19 infection in 08/2023 presented with worsening shortness of breath. On presentation, she required supplemental oxygen and was found to be influenza A positive; chest x-ray was negative for acute infiltrates. She was started on Tamiflu. Subsequently, had a syncopal event and was found to be more hypoxic. Later on, she was found to be in PEA bradycardic/asystolic arrest with ROSC after 3 rounds of ACLS. She was subsequently intubated and transferred to ICU. PCCM and cardiology were consulted. She was then found to have left pneumothorax for which pigtail catheter was placed. She also needed vasopressors for shock along with IV antibiotics. Troponins trended to more than 24,000. She was started on heparin drip. Echo showed EF of 60 to 65% with no valvular issues. She was also started on IV Solu-Medrol by PCCM. Subsequently, vasopressors were stopped. She was extubated on 10/17/2023.Chest tube was subsequently removed. She was transferred to [facility] service from 10/18/2023 onwards. Now doing great, ready for DC.  Assessment and Plan:  PEA cardiac arrest CAD/n-STEMI chronic diastolic heart failure Probable cardiogenic shock: Resolved -With ROSC after 3 rounds of CPR.  Required ICU care with vasopressors/ventilatory support/broad-spectrum antibiotics.  Subsequently extubated and weaned off vasopressors.  Transferred to [facility] service from 10/18/2023 onwards.  Troponins trended to more than 24,000.  Completed hep drip.  Patient was seen by cardiology.  Underwent cardiac catheterization on 3/25.  Triple-vessel disease was noted but none  of them amenable to PCI.  Not thought to be a good candidate for bypass surgery.  Medical management is being pursued.  She is noted to be on aspirin, amlodipine, statin.  No further cardiac workup at this time.  Cardiology has signed off.   Influenza A pneumonia COPD exacerbation Acute respiratory failure with hypoxia -Treated with Tamiflu, steroids for 5 days.  Also completed 5 days of Rocephin.  Has been weaned off of oxygen.  Seen by speech therapy and no concerns identified for aspiration.   She was subsequently started on doxycycline with improvement in WBC.  Symptoms have also improved.  She is saturating normal on room air.  7-day course of doxycycline.   Mildly abnormal LFTs -Mildly elevated AST and ALT likely related to her cardiac arrest.  No tenderness in the right upper quadrant.  Levels have improved.     Pneumothorax -Required pigtail catheter placement which has subsequently been removed.   Essential hypertension  P-atient is currently on amlodipine and metoprolol.  Dose of amlodipine was increased on 3/28.  There is room to go up on the dose of metoprolol as well.  But for now her blood pressure seems to have improved.  Continue current regimen.   Syncope -Echo and cardiac catheterization as above.  Telemetry with no abnormalities.  Has completed workup.   Thrombocytopenia -Questionable cause.  Resolved   Macrocytic anemia Possible iron deficiency anemia. -B12 and folate normal. -IV iron was ordered. Continue iron supplements.   History of unspecified CVA Hyperlipidemia -No deficits.  Continue aspirin and statin   Diabetes mellitus type 2 with hyperglycemia -A1c 5.  Resume home medications at discharge.   NAFLD/cirrhosis -Appears compensated.  Outpatient follow-up with GI   History of breast cancer -  Outpatient follow-up with oncology   Physical deconditioning -Seen by PT and OT.  Rehabilitation is being pursued.   Moderate protein calorie  malnutrition Nutrition Problem: Moderate Malnutrition Etiology: acute illness  Goals of Care -Initially pursuing short-term rehab, however insurance authorization with peer-to-peer was denied secondary to patient being able to ambulate 75 feet with CGA.  Patient was to be discharged home with home health however discussion with the patient's daughter stating that the patient is unable to care for herself at home safely and is requesting an expedited appeal for SNF.      Subjective: Patient resting comfortably in bed, working with physical therapy.  Denies any shortness of breath, chest pain, nausea, vomiting, abdominal pain.  Was able to call the patient's daughter to update her on patient's status and transition of care.  Had a long discussion with the daughter stating that unfortunately the patient has been denied authorization for inpatient rehab.  This is because the patient has shown improvement ambulation and no longer qualifies.  The daughter however is very concerned about the wellbeing of her mother and likely unsafe to be home without extra aid.  Patient lives with her husband who is also frail and unable to help.  Tried to establish that we can send patient home with home health services however patient's daughter was adamant about pursuing short-term rehab.  Admitted I would work with case management on available options and would hold off on discharging today given the concern for unsafe disposition.  Physical Exam: Vitals:   10/28/23 0126 10/28/23 0540 10/28/23 0743 10/28/23 0953  BP: (!) 114/45 (!) 134/53 (!) 121/54 (!) 120/50  Pulse: 80 91 87 88  Resp: 16 20 (!) 24   Temp: 98.7 F (37.1 C) 98.6 F (37 C) 98.6 F (37 C)   TempSrc: Oral Oral Oral   SpO2: 99% 100% 100%   Weight:  62.4 kg    Height:       GENERAL:  Alert, pleasant, no acute distress  HEENT:  EOMI CARDIOVASCULAR:  RRR, no murmurs appreciated RESPIRATORY:  Clear to auscultation, no wheezing, rales, or  rhonchi GASTROINTESTINAL:  Soft, nontender, nondistended EXTREMITIES:  No LE edema bilaterally NEURO:  No new focal deficits appreciated SKIN:  No rashes noted PSYCH:  Appropriate mood and affect   Data Reviewed:  There are no new results to review at this time.  Family Communication: Daughter via phone  Disposition: Status is: Inpatient Remains inpatient appropriate because: Unsafe disposition  Planned Discharge Destination: Barriers to discharge: Expedited appeal for SNF    Time spent: 55 minutes  Author: Deanna Artis, DO 10/28/2023 12:04 PM  For on call review [email or web address].

## 2023-10-28 NOTE — Plan of Care (Signed)
  Problem: Education: Goal: Ability to describe self-care measures that may prevent or decrease complications (Diabetes Survival Skills Education) will improve Outcome: Progressing Goal: Individualized Educational Video(s) Outcome: Progressing   Problem: Coping: Goal: Ability to adjust to condition or change in health will improve Outcome: Progressing   Problem: Fluid Volume: Goal: Ability to maintain a balanced intake and output will improve Outcome: Progressing   Problem: Health Behavior/Discharge Planning: Goal: Ability to identify and utilize available resources and services will improve Outcome: Progressing Goal: Ability to manage health-related needs will improve Outcome: Progressing   Problem: Metabolic: Goal: Ability to maintain appropriate glucose levels will improve Outcome: Progressing   Problem: Nutritional: Goal: Maintenance of adequate nutrition will improve Outcome: Progressing Goal: Progress toward achieving an optimal weight will improve Outcome: Progressing   Problem: Skin Integrity: Goal: Risk for impaired skin integrity will decrease Outcome: Progressing   Problem: Tissue Perfusion: Goal: Adequacy of tissue perfusion will improve Outcome: Progressing   Problem: Activity: Goal: Ability to tolerate increased activity will improve Outcome: Progressing   Problem: Respiratory: Goal: Ability to maintain a clear airway and adequate ventilation will improve Outcome: Progressing   Problem: Role Relationship: Goal: Method of communication will improve Outcome: Progressing   Problem: Education: Goal: Knowledge of General Education information will improve Description: Including pain rating scale, medication(s)/side effects and non-pharmacologic comfort measures Outcome: Progressing   Problem: Health Behavior/Discharge Planning: Goal: Ability to manage health-related needs will improve Outcome: Progressing   Problem: Clinical Measurements: Goal:  Ability to maintain clinical measurements within normal limits will improve Outcome: Progressing Goal: Will remain free from infection Outcome: Progressing Goal: Diagnostic test results will improve Outcome: Progressing Goal: Respiratory complications will improve Outcome: Progressing Goal: Cardiovascular complication will be avoided Outcome: Progressing   Problem: Activity: Goal: Risk for activity intolerance will decrease Outcome: Progressing   Problem: Nutrition: Goal: Adequate nutrition will be maintained Outcome: Progressing   Problem: Coping: Goal: Level of anxiety will decrease Outcome: Progressing   Problem: Elimination: Goal: Will not experience complications related to bowel motility Outcome: Progressing Goal: Will not experience complications related to urinary retention Outcome: Progressing   Problem: Pain Managment: Goal: General experience of comfort will improve and/or be controlled Outcome: Progressing   Problem: Safety: Goal: Ability to remain free from injury will improve Outcome: Progressing   Problem: Skin Integrity: Goal: Risk for impaired skin integrity will decrease Outcome: Progressing   Problem: Education: Goal: Understanding of CV disease, CV risk reduction, and recovery process will improve Outcome: Progressing Goal: Individualized Educational Video(s) Outcome: Progressing   Problem: Activity: Goal: Ability to return to baseline activity level will improve Outcome: Progressing   Problem: Cardiovascular: Goal: Ability to achieve and maintain adequate cardiovascular perfusion will improve Outcome: Progressing Goal: Vascular access site(s) Level 0-1 will be maintained Outcome: Progressing   Problem: Health Behavior/Discharge Planning: Goal: Ability to safely manage health-related needs after discharge will improve Outcome: Progressing

## 2023-10-28 NOTE — Progress Notes (Addendum)
 Physical Therapy Treatment Patient Details Name: DENIELLE BAYARD MRN: [id number] DOB: 1945/09/21 Today's Date: 10/28/2023   History of Present Illness Pt is 78 yo presenting to [facility] ED on 3/18 due to worsening SOB, orthopnea and wheezing with residual cough since COVID. In ED, pt with syncopal event and later PEA bradycardic/asystolic arrest with ROSC after 3 rounds of ACLS. 3/18-3/23 ICU stay. 3/18-3/22 intubation. 3/25 cardiac catheterization. PMH: HTN, HLD, CVA, DM, CKD II, HFEF, anemia, breast cancer, NAFLD/cirrhosis and COVID 08/2023    PT Comments  Pt in bed upon arrival and agreeable to PT session. Pt reported just getting back from transferring to the [facility] and was feeling fatigued. Pt also with pain in buttocks that increases when seated at EOB. Pt was agreeable to perform LE exercises in supine and to work on bed mobility. Pt fatigues easily and needs rest breaks in between sets. Encouraged pt to ambulate today with staff to continue working on mobility with pt agreement. Pt is progressing well towards goals. Pt would continue to benefit from <3hrs post acute rehab to work towards independence with mobility. Acute PT to follow.      If plan is discharge home, recommend the following: A little help with walking and/or transfers;Assist for transportation;Help with stairs or ramp for entrance;Assistance with cooking/housework;A little help with bathing/dressing/bathroom   Can travel by private vehicle     Yes  Equipment Recommendations  Rolling walker (2 wheels);BSC/3in1;Wheelchair (measurements PT);Wheelchair cushion (measurements PT)       Precautions / Restrictions Precautions Precautions: Fall Recall of Precautions/Restrictions: Intact Restrictions Weight Bearing Restrictions Per Provider Order: No     Mobility  Bed Mobility Overal bed mobility: Needs Assistance Bed Mobility: Supine to Sit, Sit to Supine    Supine to sit: Min assist, HOB elevated Sit to supine: HOB elevated,  Contact guard assist   General bed mobility comments: pt agreeable to move into long-sitting with MinA via 1HH. Declined sitting EOB or transferring due to pain in buttocks      Balance Overall balance assessment: Needs assistance Sitting-balance support: Feet supported, No upper extremity supported Sitting balance-Leahy Scale: Fair       Hotel manager: No apparent difficulties  Cognition Arousal: Alert Behavior During Therapy: WFL for tasks assessed/performed   PT - Cognitive impairments: No apparent impairments    Following commands: Intact      Cueing Cueing Techniques: Verbal cues, Gestural cues  Exercises General Exercises - Lower Extremity Heel Slides: AROM, Both, 10 reps, Supine (with isometric hold in extension) Hip ABduction/ADduction: AROM, Both, 10 reps, Supine (held in SLR) Straight Leg Raises: AROM, Both, 10 reps, Supine Other Exercises Other Exercises: x5 sit-up via 1HH, MinA        Pertinent Vitals/Pain Pain Assessment Pain Assessment: Faces Faces Pain Scale: Hurts little more Pain Location: buttocks Pain Descriptors / Indicators: Aching, Discomfort Pain Intervention(s): Limited activity within patient's tolerance, Monitored during session, Repositioned     PT Goals (current goals can now be found in the care plan section) Acute Rehab PT Goals Patient Stated Goal: to get stronger PT Goal Formulation: With patient Time For Goal Achievement: 11/01/23 Potential to Achieve Goals: Good Progress towards PT goals: Progressing toward goals    Frequency    Min 2X/week       AM-PAC PT "6 Clicks" Mobility   Outcome Measure  Help needed turning from your back to your side while in a flat bed without using bedrails?: A Little Help needed moving from lying on  your back to sitting on the side of a flat bed without using bedrails?: A Little Help needed moving to and from a bed to a chair (including a wheelchair)?: A  Little Help needed standing up from a chair using your arms (e.g., wheelchair or bedside chair)?: A Little Help needed to walk in hospital room?: A Little Help needed climbing 3-5 steps with a railing? : Total 6 Click Score: 16    End of Session   Activity Tolerance: Patient tolerated treatment well;Other (comment) (Treatment limited secondary to lunch arriving and pt being hungry and ready to eat.) Patient left: in bed;with call bell/phone within reach;with bed alarm set Nurse Communication: Mobility status PT Visit Diagnosis: Unsteadiness on feet (R26.81);Other abnormalities of gait and mobility (R26.89)     Time: 4742-5956 PT Time Calculation (min) (ACUTE ONLY): 20 min  Charges:    $Therapeutic Exercise: 8-22 mins PT General Charges $$ ACUTE PT VISIT: 1 Visit                    Hilton Cork, PT, DPT Secure Chat Preferred  Rehab Office [phone]    Arturo Morton Brion Aliment 10/28/2023, 9:25 AM

## 2023-10-28 NOTE — Evaluation (Signed)
 Clinical/Bedside Swallow Evaluation Patient Details  Name: Anita Mccormick MRN: [id number] Date of Birth: 24-Nov-1945  Today's Date: 10/28/2023 Time: SLP Start Time (ACUTE ONLY): 1425 SLP Stop Time (ACUTE ONLY): 1448 SLP Time Calculation (min) (ACUTE ONLY): 23 min  Past Medical History:  Past Medical History:  Diagnosis Date   Arthritis    Cancer (HCC)    breast left   Cerebrovascular accident (HCC)    October 29 2021- Left eye- blind   COLONIC POLYPS, HX OF 12/10/2006   Qualifier: Diagnosis of  By: Cato Mulligan MD, Bruce     Diabetes mellitus type II, controlled (HCC) 12/10/2006   Poor control on Januvia 100mg  and glipizide 10mg  XL Lab Results  Component Value Date   HGBA1C 8.3* 11/02/2014        Eczema    Fatty liver disease, nonalcoholic 07/14/2014   Per Dr. Cato Mulligan Lab Results  Component Value Date   ALT 31 11/02/2014   AST 57* 11/02/2014   ALKPHOS 104 11/02/2014   BILITOT 1.1 11/02/2014       GERD (gastroesophageal reflux disease)    History of blood transfusion    History of kidney stones 2023   11/28/21 small stone currently, taking flomax   Hypertension    Iron deficiency anemia, unspecified 05/08/2023   Sepsis (HCC) 09/19/2017   Sepsis due to Escherichia coli (E. coli) (HCC)    Symptomatic anemia 06/06/2021   UTI (urinary tract infection) 09/19/2017   Vaginal discharge 04/01/2022   Past Surgical History:  Past Surgical History:  Procedure Laterality Date   ARTERY BIOPSY Left 11/29/2021   Procedure: LEFT BIOPSY TEMPORAL ARTERY;  Surgeon: Chuck Hint, MD;  Location: [facility] OR;  Service: Vascular;  Laterality: Left;   CATARACT EXTRACTION Bilateral    LEFT HEART CATH AND CORONARY ANGIOGRAPHY N/A 10/20/2023   Procedure: LEFT HEART CATH AND CORONARY ANGIOGRAPHY;  Surgeon: [place], Peter M, MD;  Location: [facility] INVASIVE CV LAB;  Service: Cardiovascular;  Laterality: N/A;   MASTECTOMY Left    TUBAL LIGATION     HPI:  Pt is 78 yo presenting to [facility] ED on 3/18 due to worsening shortness of  breath, orthopnea and wheezing with residual cough since COVID. Pt had NSTEMI while in ED with plan for [facility] 3/25. Pt had a swallow eval 3/28 without concern for oropharyngeal dysphagia so SLP s/o. Swallow eval was ordered again 4/2 due to reports of difficulty swallowing pills, resulting in coughing. PMH: GERD, HTN, HLD, CVA, DM, CKD II, HFEF, anemia, breast cancer, NAFLD/cirrhosis and COVID 08/2023    Assessment / Plan / Recommendation  Clinical Impression  Pt continues to present with normal appearing oropharyngeal swallow. She believes that her throat is feeling better than it was earlier this admission, but also endorses a h/o a burning in her throat PTA (does have h/o GERD). She reportedly had trouble taking her pills this morning and per pt she thinks this is related to the number of pills she has to take at a time. She thinks she can do better when she takes them one at a time, but could also consider giving them in puree as needed. Will leave on regular diet and thin liquids, using aspiration and esophageal precautions. No further SLP f/u indicated.   SLP Visit Diagnosis: Dysphagia, unspecified (R13.10)    Aspiration Risk       Diet Recommendation Regular;Thin liquid    Liquid Administration via: Cup;Straw Medication Administration: Whole meds with liquid (one at a time, or give with puree)  Supervision: Patient able to self feed Compensations: Minimize environmental distractions;Slow rate;Small sips/bites Postural Changes: Seated upright at 90 degrees;Remain upright for at least 30 minutes after po intake    Other  Recommendations Oral Care Recommendations: Oral care BID    Recommendations for follow up therapy are one component of a multi-disciplinary discharge planning process, led by the attending physician.  Recommendations may be updated based on patient status, additional functional criteria and insurance authorization.  Follow up Recommendations No SLP follow up       Assistance Recommended at Discharge    Functional Status Assessment Patient has not had a recent decline in their functional status  Frequency and Duration            Prognosis        Swallow Study   General HPI: Pt is 78 yo presenting to [facility] ED on 3/18 due to worsening shortness of breath, orthopnea and wheezing with residual cough since COVID. Pt had NSTEMI while in ED with plan for [facility] 3/25. Pt had a swallow eval 3/28 without concern for oropharyngeal dysphagia so SLP s/o. Swallow eval was ordered again 4/2 due to reports of difficulty swallowing pills, resulting in coughing. PMH: GERD, HTN, HLD, CVA, DM, CKD II, HFEF, anemia, breast cancer, NAFLD/cirrhosis and COVID 08/2023 Type of Study: Bedside Swallow Evaluation Previous Swallow Assessment: see HPI Diet Prior to this Study: Regular;Thin liquids (Level 0) Temperature Spikes Noted: No Respiratory Status: Room air History of Recent Intubation: No Behavior/Cognition: Alert;Cooperative;Pleasant mood Oral Cavity Assessment: Within Functional Limits Oral Care Completed by SLP: No Oral Cavity - Dentition: Adequate natural dentition Vision: Functional for self-feeding Self-Feeding Abilities: Able to feed self Patient Positioning: Upright in bed Baseline Vocal Quality: Normal Volitional Cough: Congested Volitional Swallow: Able to elicit    Oral/Motor/Sensory Function Overall Oral Motor/Sensory Function: Within functional limits   Ice Chips Ice chips: Not tested   Thin Liquid Thin Liquid: Within functional limits Presentation: Straw;Self Fed    Nectar Thick Nectar Thick Liquid: Not tested   Honey Thick Honey Thick Liquid: Impaired   Puree Puree: Within functional limits Presentation: Self Fed;Spoon   Solid     Solid: Within functional limits Presentation: Self Fed      Mahala Menghini., M.A. CCC-SLP Acute Rehabilitation Services Office [phone]  Secure chat preferred  10/28/2023,2:59 PM

## 2023-10-28 NOTE — Progress Notes (Signed)
 Occupational Therapy Treatment Patient Details Name: Anita Mccormick MRN: [id number] DOB: [birth year] Today's Date: 10/28/2023   History of present illness Pt is 78 yo presenting to [facility] ED on 3/18 due to worsening shortness of breathe, orthopnea and wheezing with residual cough since COVID. Pt had NSTEMI while in ED with plan for [facility] prior to discharge. PMH: HTN, HLD, CVA, DM, CKD II, HFEF, anemia, breast cancer, NAFLD/cirrhosis and COVID 08/2023   OT comments  Pt completed grooming with set up at EOB, UB bathing with min assist and UB dressing with set up. Total assist for pericare (husband reports he helps with this at home) and LB bathing and dressing. Applied lotion from heels to forefoot at pt's request. Pt left up on EOB, RN aware. Declined OOB to chair, reports chair hurts her buttocks. Patient will benefit from continued inpatient follow up therapy, <3 hours/day.      If plan is discharge home, recommend the following:  A little help with walking and/or transfers;A lot of help with bathing/dressing/bathroom;Assistance with cooking/housework;Assistance with feeding;Assist for transportation;Help with stairs or ramp for entrance   Equipment Recommendations  None recommended by OT    Recommendations for Other Services      Precautions / Restrictions Precautions Precautions: Fall Restrictions Weight Bearing Restrictions Per Provider Order: No       Mobility Bed Mobility Overal bed mobility: Needs Assistance Bed Mobility: Supine to Sit     Supine to sit: Supervision, HOB elevated, Used rails     General bed mobility comments: stating she needed help and couldn't lift her legs, but was able to slide them to EOB    Transfers Overall transfer level: Needs assistance Equipment used: Rolling walker (2 wheels) Transfers: Sit to/from Stand Sit to Stand: Contact guard assist           General transfer comment: cues for hand placement     Balance Overall balance assessment:  Needs assistance   Sitting balance-Leahy Scale: Fair       Standing balance-Leahy Scale: Poor Standing balance comment: requires at least one hand on walker in static standing                           ADL either performed or assessed with clinical judgement   ADL Overall ADL's : Needs assistance/impaired     Grooming: Wash/dry hands;Wash/dry face;Sitting;Set up   Upper Body Bathing: Minimal assistance;Sitting Upper Body Bathing Details (indicate cue type and reason): assist for back Lower Body Bathing: Total assistance;Sit to/from stand   Upper Body Dressing : Set up;Sitting   Lower Body Dressing: Total assistance;Sitting/lateral leans Lower Body Dressing Details (indicate cue type and reason): socks     Toileting- Clothing Manipulation and Hygiene: Total assistance;Sit to/from stand              Extremity/Trunk Assessment              Vision       Perception     Praxis     Communication Communication Communication: No apparent difficulties   Cognition Arousal: Alert Behavior During Therapy: WFL for tasks assessed/performed Cognition: Cognition impaired     Awareness: Intellectual awareness impaired, Online awareness impaired Memory impairment (select all impairments): Declarative long-term memory, Working memory, Short-term memory Attention impairment (select first level of impairment): Selective attention Executive functioning impairment (select all impairments): Problem solving  Following commands: Intact        Cueing      Exercises      Shoulder Instructions       General Comments      Pertinent Vitals/ Pain       Pain Assessment Pain Assessment: Faces Faces Pain Scale: Hurts little more Pain Location: chest with coughing Pain Descriptors / Indicators: Aching, Discomfort Pain Intervention(s): Monitored during session, Repositioned  Home Living                                           Prior Functioning/Environment              Frequency  Min 2X/week        Progress Toward Goals  OT Goals(current goals can now be found in the care plan section)  Progress towards OT goals: Progressing toward goals  Acute Rehab OT Goals OT Goal Formulation: With patient Time For Goal Achievement: 11/01/23 Potential to Achieve Goals: Good  Plan      Co-evaluation                 AM-PAC OT "6 Clicks" Daily Activity     Outcome Measure   Help from another person eating meals?: A Little Help from another person taking care of personal grooming?: A Little Help from another person toileting, which includes using toliet, bedpan, or urinal?: Total Help from another person bathing (including washing, rinsing, drying)?: A Lot Help from another person to put on and taking off regular upper body clothing?: A Little Help from another person to put on and taking off regular lower body clothing?: Total 6 Click Score: 13    End of Session Equipment Utilized During Treatment: Rolling walker (2 wheels)  OT Visit Diagnosis: Unsteadiness on feet (R26.81);Other abnormalities of gait and mobility (R26.89);Muscle weakness (generalized) (M62.81);Other symptoms and signs involving cognitive function   Activity Tolerance Patient tolerated treatment well   Patient Left in bed;with call bell/phone within reach;with bed alarm set;with family/visitor present   Nurse Communication          Time: 1610-9604 OT Time Calculation (min): 34 min  Charges: OT General Charges $OT Visit: 1 Visit OT Treatments $Self Care/Home Management : 23-37 mins  Berna Spare, OTR/L Acute Rehabilitation Services Office: [phone]   Evern Bio 10/28/2023, 11:35 AM

## 2023-10-28 NOTE — TOC Progression Note (Addendum)
 Transition of Care [facility]) - Progression Note    Patient Details  Name: Anita Mccormick MRN: [id number] Date of Birth: 02-28-1946  Transition of Care [facility]) CM/SW Contact  Michaela Corner, [place] Phone Number: 10/28/2023, 8:54 AM  Clinical Narrative:   CSW called [facility] as the portal is down at this time to check on patients insurance auth. Per the representative, Francine Graven will be offering a peer to peer. CSW inquired about peer to peer information, the representative statement another department will give CSW a call with peer to peer information.   9:26AM: Insurance called CSW to offer a peer to peer for SNF due by 1:30PM today. MD will need to call [phone] opt 5. will need to say patients name, member id, and DOB. CSW notified MD.   9:57AM: Per MD, peer to peer was denied. MD asked about pt going home with HH. CSW notified RNCM. Per RNCM, pts dtr wants to complete a fast appeal.   11:02 AM CSW called patients dtr with fast appeal information. CSW provided fast appeal number ([phone]).   TOC will continue to follow.    Expected Discharge Plan: Skilled Nursing Facility Barriers to Discharge: Continued Medical Work up  Expected Discharge Plan and Services   Discharge Planning Services: CM Consult Post Acute Care Choice: Skilled Nursing Facility Living arrangements for the past 2 months: Single Family Home                                       Social Determinants of Health (SDOH) Interventions SDOH Screenings   Food Insecurity: No Food Insecurity (10/13/2023)  Housing: Low Risk  (10/13/2023)  Transportation Needs: No Transportation Needs (10/13/2023)  Utilities: Not At Risk (10/13/2023)  Alcohol Screen: Low Risk  (10/03/2022)  Depression (PHQ2-9): Low Risk  (07/06/2023)  Financial Resource Strain: Low Risk  (10/03/2022)  Physical Activity: Inactive (10/03/2022)  Social Connections: Moderately Isolated (10/13/2023)  Stress: No Stress Concern Present (10/03/2022)  Tobacco  Use: High Risk (10/13/2023)    Readmission Risk Interventions    10/19/2023   10:43 AM  Readmission Risk Prevention Plan  Post Dischage Appt Complete  Medication Screening Complete  Transportation Screening Complete

## 2023-10-29 DIAGNOSIS — J9601 Acute respiratory failure with hypoxia: Secondary | ICD-10-CM | POA: Diagnosis not present

## 2023-10-29 LAB — GLUCOSE, CAPILLARY
Glucose-Capillary: 202 mg/dL — ABNORMAL HIGH (ref 70–99)
Glucose-Capillary: 290 mg/dL — ABNORMAL HIGH (ref 70–99)
Glucose-Capillary: 274 mg/dL — ABNORMAL HIGH (ref 70–99)
Glucose-Capillary: 168 mg/dL — ABNORMAL HIGH (ref 70–99)

## 2023-10-29 MED ORDER — INSULIN ASPART 100 UNIT/ML IJ SOLN
2.0000 [IU] | Freq: Three times a day (TID) | INTRAMUSCULAR | Status: DC
  Administered 2023-10-29 – 2023-11-04 (×17): 2 [IU] via SUBCUTANEOUS

## 2023-10-29 NOTE — Progress Notes (Signed)
 Mobility Specialist Progress Note:   10/29/23 1415  Mobility  Activity Ambulated with assistance in room  Level of Assistance Contact guard assist, steadying assist  Assistive Device Front wheel walker  Distance Ambulated (ft) 20 ft  Activity Response Tolerated well  Mobility Referral Yes  Mobility visit 1 Mobility  Mobility Specialist Start Time (ACUTE ONLY) 0949  Mobility Specialist Stop Time (ACUTE ONLY) 1005  Mobility Specialist Time Calculation (min) (ACUTE ONLY) 16 min   Pt received in chair hesitant but agreeable to mobility. Pt was able to ambulate around room, distance limited d/t pain in buttocks. Returned to chair w/ call bell and personal belongings in reach. Chair alarm on. Family in room. All needs met.  Thompson Grayer Mobility Specialist  Please contact vis Secure Chat or  Rehab Office [phone]

## 2023-10-29 NOTE — Inpatient Diabetes Management (Signed)
 Inpatient Diabetes Program Recommendations  AACE/ADA: New Consensus Statement on Inpatient Glycemic Control (2015)  Target Ranges:  Prepandial:   less than 140 mg/dL      Peak postprandial:   less than 180 mg/dL (1-2 hours)      Critically ill patients:  140 - 180 mg/dL   Lab Results  Component Value Date   GLUCAP 202 (H) 10/29/2023   HGBA1C 5.0 10/13/2023    Review of Glycemic Control   Latest Reference Range & Units 10/28/23 05:45 10/28/23 10:58 10/28/23 16:38 10/28/23 21:09 10/29/23 06:36  Glucose-Capillary 70 - 99 mg/dL 161 (H) 096 (H) 045 (H) 172 (H) 202 (H)    Diabetes history: DM 2 Outpatient Diabetes medications:  Glucotrol XL 10 mg daily Trulicity 1.5 mg weekly Dexcom G7 Current orders for Inpatient glycemic control:  Novolog 0-15 units tid with meals and HS  Boost tid between meals Glucose trends increase after PO intake  Inpatient Diabetes Program Recommendations:    -   Consider adding Novolog 2 units tid with meals/supplements (hold if patient eats less than 50% or NPO).   Thanks,  Christena Deem RN, MSN, BC-ADM Inpatient Diabetes Coordinator Team Pager [phone] (8a-5p)

## 2023-10-29 NOTE — Plan of Care (Signed)
  Problem: Education: Goal: Ability to describe self-care measures that may prevent or decrease complications (Diabetes Survival Skills Education) will improve Outcome: Progressing Goal: Individualized Educational Video(s) Outcome: Progressing   Problem: Coping: Goal: Ability to adjust to condition or change in health will improve Outcome: Progressing   Problem: Fluid Volume: Goal: Ability to maintain a balanced intake and output will improve Outcome: Progressing   Problem: Health Behavior/Discharge Planning: Goal: Ability to identify and utilize available resources and services will improve Outcome: Progressing Goal: Ability to manage health-related needs will improve Outcome: Progressing   Problem: Metabolic: Goal: Ability to maintain appropriate glucose levels will improve Outcome: Progressing   Problem: Nutritional: Goal: Maintenance of adequate nutrition will improve Outcome: Progressing Goal: Progress toward achieving an optimal weight will improve Outcome: Progressing   Problem: Skin Integrity: Goal: Risk for impaired skin integrity will decrease Outcome: Progressing   Problem: Tissue Perfusion: Goal: Adequacy of tissue perfusion will improve Outcome: Progressing   Problem: Activity: Goal: Ability to tolerate increased activity will improve Outcome: Progressing   Problem: Respiratory: Goal: Ability to maintain a clear airway and adequate ventilation will improve Outcome: Progressing   Problem: Role Relationship: Goal: Method of communication will improve Outcome: Progressing   Problem: Education: Goal: Knowledge of General Education information will improve Description: Including pain rating scale, medication(s)/side effects and non-pharmacologic comfort measures Outcome: Progressing   Problem: Health Behavior/Discharge Planning: Goal: Ability to manage health-related needs will improve Outcome: Progressing   Problem: Clinical Measurements: Goal:  Ability to maintain clinical measurements within normal limits will improve Outcome: Progressing Goal: Will remain free from infection Outcome: Progressing Goal: Diagnostic test results will improve Outcome: Progressing Goal: Respiratory complications will improve Outcome: Progressing Goal: Cardiovascular complication will be avoided Outcome: Progressing   Problem: Activity: Goal: Risk for activity intolerance will decrease Outcome: Progressing   Problem: Nutrition: Goal: Adequate nutrition will be maintained Outcome: Progressing   Problem: Coping: Goal: Level of anxiety will decrease Outcome: Progressing   Problem: Elimination: Goal: Will not experience complications related to bowel motility Outcome: Progressing Goal: Will not experience complications related to urinary retention Outcome: Progressing   Problem: Pain Managment: Goal: General experience of comfort will improve and/or be controlled Outcome: Progressing   Problem: Safety: Goal: Ability to remain free from injury will improve Outcome: Progressing   Problem: Skin Integrity: Goal: Risk for impaired skin integrity will decrease Outcome: Progressing   Problem: Education: Goal: Understanding of CV disease, CV risk reduction, and recovery process will improve Outcome: Progressing Goal: Individualized Educational Video(s) Outcome: Progressing   Problem: Activity: Goal: Ability to return to baseline activity level will improve Outcome: Progressing   Problem: Cardiovascular: Goal: Ability to achieve and maintain adequate cardiovascular perfusion will improve Outcome: Progressing Goal: Vascular access site(s) Level 0-1 will be maintained Outcome: Progressing   Problem: Health Behavior/Discharge Planning: Goal: Ability to safely manage health-related needs after discharge will improve Outcome: Progressing

## 2023-10-29 NOTE — TOC Progression Note (Addendum)
 Transition of Care [facility]) - Progression Note    Patient Details  Name: Anita Mccormick MRN: [id number] Date of Birth: 05-26-1946  [facility]) CM/SW Contact  Michaela Corner, [place] Phone Number: 10/29/2023, 11:55 AM  Clinical Narrative:   CSW spoke with [place] regarding status of appeal. Lynnell Catalan stated she is starting the appeal today for SNF.   2:09 PM CSW submitted Appointment of Representative form and PT note for insurance appeal.   4:26 PM Heather from [facility] requested additional clinicals for appeal. CSW sent clinicals to her fax number at [phone].  TOC will continue to follow.    Expected Discharge Plan: Skilled Nursing Facility Barriers to Discharge: Continued Medical Work up  Expected Discharge Plan and Services   Discharge Planning Services: CM Consult Post Acute Care Choice: Skilled Nursing Facility Living arrangements for the past 2 months: Single Family Home                                       Social Determinants of Health (SDOH) Interventions SDOH Screenings   Food Insecurity: No Food Insecurity (10/13/2023)  Housing: Low Risk  (10/13/2023)  Transportation Needs: No Transportation Needs (10/13/2023)  Utilities: Not At Risk (10/13/2023)  Alcohol Screen: Low Risk  (10/03/2022)  Depression (PHQ2-9): Low Risk  (07/06/2023)  Financial Resource Strain: Low Risk  (10/03/2022)  Physical Activity: Inactive (10/03/2022)  Social Connections: Moderately Isolated (10/13/2023)  Stress: No Stress Concern Present (10/03/2022)  Tobacco Use: High Risk (10/13/2023)    Readmission Risk Interventions    10/19/2023   10:43 AM  Readmission Risk Prevention Plan  Post Dischage Appt Complete  Medication Screening Complete  Transportation Screening Complete

## 2023-10-29 NOTE — Progress Notes (Signed)
 Progress Note   Patient: Anita Mccormick GEX:[id number] DOB: 1945/11/05 DOA: 10/13/2023     16 DOS: the patient was seen and examined on 10/29/2023   Brief hospital course: 78 year old female with history of tobacco abuse, hypertension, hyperlipidemia, unspecified CVA, diabetes mellitus type 2, CKD stage II, chronic diastolic heart failure, anemia, breast cancer, NAFLD/cirrhosis and COVID-19 infection in 08/2023 presented with worsening shortness of breath. On presentation, she required supplemental oxygen and was found to be influenza A positive; chest x-ray was negative for acute infiltrates. She was started on Tamiflu. Subsequently, had a syncopal event and was found to be more hypoxic. Later on, she was found to be in PEA bradycardic/asystolic arrest with ROSC after 3 rounds of ACLS. She was subsequently intubated and transferred to ICU. PCCM and cardiology were consulted. She was then found to have left pneumothorax for which pigtail catheter was placed. She also needed vasopressors for shock along with IV antibiotics. Troponins trended to more than 24,000. She was started on heparin drip. Echo showed EF of 60 to 65% with no valvular issues. She was also started on IV Solu-Medrol by PCCM. Subsequently, vasopressors were stopped. She was extubated on 10/17/2023.Chest tube was subsequently removed. She was transferred to [facility] service from 10/18/2023 onwards. Now doing great, ready for DC.  Assessment and Plan:  PEA cardiac arrest CAD/n-STEMI chronic diastolic heart failure Probable cardiogenic shock: Resolved -With ROSC after 3 rounds of CPR.  Required ICU care with vasopressors/ventilatory support/broad-spectrum antibiotics.  Subsequently extubated and weaned off vasopressors.  Transferred to [facility] service from 10/18/2023 onwards.  Troponins trended to more than 24,000.  Completed hep drip.  Patient was seen by cardiology.  Underwent cardiac catheterization on 3/25.  Triple-vessel disease was noted but none  of them amenable to PCI.  Not thought to be a good candidate for bypass surgery.  Medical management is being pursued.  She is noted to be on aspirin, amlodipine, statin.  No further cardiac workup at this time.  Cardiology has signed off.   Influenza A pneumonia COPD exacerbation Acute respiratory failure with hypoxia -Treated with Tamiflu, steroids for 5 days.  Also completed 5 days of Rocephin.  Has been weaned off of oxygen.  Seen by speech therapy and no concerns identified for aspiration.   She was subsequently started on doxycycline with improvement in WBC.  Symptoms have also improved.  She is saturating normal on room air.  7-day course of doxycycline.  Appears resolved.   Mildly abnormal LFTs -Mildly elevated AST and ALT likely related to her cardiac arrest.  No tenderness in the right upper quadrant.  Levels have improved.     Pneumothorax -Required pigtail catheter placement which has subsequently been removed.  Resolved.   Essential hypertension  P-atient is currently on amlodipine and metoprolol.  Dose of amlodipine was increased on 3/28.  There is room to go up on the dose of metoprolol as well.  But for now her blood pressure seems to have improved.  Continue current regimen.   Syncope -Echo and cardiac catheterization as above.  Telemetry with no abnormalities.  Has completed workup.   Thrombocytopenia -Questionable cause.  Resolved   Macrocytic anemia Possible iron deficiency anemia. -B12 and folate normal. -IV iron was ordered. Continue iron supplements.   History of unspecified CVA Hyperlipidemia -No deficits.  Continue aspirin and statin   Diabetes mellitus type 2 with hyperglycemia -A1c 5.  Resume home medications at discharge.   NAFLD/cirrhosis -Appears compensated.  Outpatient follow-up with GI  History of breast cancer -Outpatient follow-up with oncology   Physical deconditioning -Seen by PT and OT.  Rehabilitation is being pursued.   Moderate  protein calorie malnutrition Nutrition Problem: Moderate Malnutrition Etiology: acute illness  Goals of Care -Initially pursuing short-term rehab, however insurance authorization with peer-to-peer was denied secondary to patient being able to ambulate 75 feet with CGA.  Patient was to be discharged home with home health however discussion with the patient's daughter stating that the patient is unable to care for herself at home safely and is requesting an expedited appeal for SNF.   The daughter is very concerned about the wellbeing of her mother and likely unsafe to be home without extra aid.  Patient lives with her husband who is also frail and unable to help.  She is adamant about pursuing short-term rehab.  Working closely with case management on available options and would hold off on discharging today given the concern for unsafe disposition.      Subjective: Patient resting comfortably in bed, working with physical therapy.  Denies any shortness of breath, chest pain, nausea, vomiting, abdominal pain.  Physical Exam: Vitals:   10/29/23 0525 10/29/23 0725 10/29/23 0919 10/29/23 1135  BP: (!) 132/48 (!) 118/40 (!) 124/50 (!) 119/51  Pulse: 89 85 84 77  Resp: 17 (!) 21  (!) 24  Temp: 99 F (37.2 C) 99.1 F (37.3 C)  98.6 F (37 C)  TempSrc: Oral Oral  Oral  SpO2: 96% 100%  98%  Weight:      Height:       GENERAL:  Alert, pleasant, no acute distress  HEENT:  EOMI CARDIOVASCULAR:  RRR, no murmurs appreciated RESPIRATORY:  Clear to auscultation, no wheezing, rales, or rhonchi GASTROINTESTINAL:  Soft, nontender, nondistended EXTREMITIES:  No LE edema bilaterally NEURO:  No new focal deficits appreciated SKIN:  No rashes noted PSYCH:  Appropriate mood and affect   Data Reviewed:  There are no new results to review at this time.  Family Communication: Daughter via phone  Disposition: Status is: Inpatient Remains inpatient appropriate because: Unsafe disposition  Planned  Discharge Destination: Barriers to discharge: Expedited appeal for SNF    Time spent: 32 minutes  Author: Deanna Artis, DO 10/29/2023 12:32 PM  For on call review [email or web address].

## 2023-10-30 DIAGNOSIS — J9601 Acute respiratory failure with hypoxia: Secondary | ICD-10-CM | POA: Diagnosis not present

## 2023-10-30 LAB — GLUCOSE, CAPILLARY
Glucose-Capillary: 192 mg/dL — ABNORMAL HIGH (ref 70–99)
Glucose-Capillary: 249 mg/dL — ABNORMAL HIGH (ref 70–99)
Glucose-Capillary: 257 mg/dL — ABNORMAL HIGH (ref 70–99)
Glucose-Capillary: 182 mg/dL — ABNORMAL HIGH (ref 70–99)

## 2023-10-30 MED ORDER — FUROSEMIDE 10 MG/ML IJ SOLN
40.0000 mg | Freq: Every day | INTRAMUSCULAR | Status: DC
  Administered 2023-10-30 – 2023-11-03 (×5): 40 mg via INTRAVENOUS
  Filled 2023-10-30 (×5): qty 4

## 2023-10-30 NOTE — Progress Notes (Signed)
 Progress Note   Patient: Anita Mccormick ZOX:[id number] DOB: [birth year] DOA: 10/13/2023     17 DOS: the patient was seen and examined on 10/30/2023   Brief hospital course: 78 year old female with history of tobacco abuse, hypertension, hyperlipidemia, unspecified CVA, diabetes mellitus type 2, CKD stage II, chronic diastolic heart failure, anemia, breast cancer, NAFLD/cirrhosis and COVID-19 infection in 08/2023 presented with worsening shortness of breath. On presentation, she required supplemental oxygen and was found to be influenza A positive; chest x-ray was negative for acute infiltrates. She was started on Tamiflu. Subsequently, had a syncopal event and was found to be more hypoxic. Later on, she was found to be in PEA bradycardic/asystolic arrest with ROSC after 3 rounds of ACLS. She was subsequently intubated and transferred to ICU. PCCM and cardiology were consulted. She was then found to have left pneumothorax for which pigtail catheter was placed. She also needed vasopressors for shock along with IV antibiotics. Troponins trended to more than 24,000. She was started on heparin drip. Echo showed EF of 60 to 65% with no valvular issues. She was also started on IV Solu-Medrol by PCCM. Subsequently, vasopressors were stopped. She was extubated on 10/17/2023.Chest tube was subsequently removed. She was transferred to [facility] service from 10/18/2023 onwards. Now doing great, ready for DC.  Assessment and Plan:  PEA cardiac arrest CAD/n-STEMI chronic diastolic heart failure Probable cardiogenic shock: Resolved -With ROSC after 3 rounds of CPR.  Required ICU care with vasopressors/ventilatory support/broad-spectrum antibiotics.  Subsequently extubated and weaned off vasopressors.  Transferred to [facility] service from 10/18/2023 onwards.  Troponins trended to more than 24,000.  Completed hep drip.  Patient was seen by cardiology.  Underwent cardiac catheterization on 3/25.  Triple-vessel disease was noted but none  of them amenable to PCI.  Not thought to be a good candidate for bypass surgery.  Medical management is being pursued.  She is noted to be on aspirin, amlodipine, statin.  No further cardiac workup at this time.  Cardiology has signed off.   Influenza A pneumonia COPD exacerbation Acute respiratory failure with hypoxia -Treated with Tamiflu, steroids for 5 days.  Also completed 5 days of Rocephin.  Has been weaned off of oxygen.  Seen by speech therapy and no concerns identified for aspiration.   She was subsequently started on doxycycline with improvement in WBC.  Symptoms have also improved.  She is saturating normal on room air.  7-day course of doxycycline.  Appears resolved.  Will give trial of IV Lasix.  Robitussin/Mucinex on board.   Mildly abnormal LFTs -Mildly elevated AST and ALT likely related to her cardiac arrest.  No tenderness in the right upper quadrant.  Levels have improved.     Pneumothorax -Required pigtail catheter placement which has subsequently been removed.  Resolved.   Essential hypertension  -currently on amlodipine and metoprolol.  Continue current regimen.   Syncope -Echo and cardiac catheterization as above.  Telemetry with no abnormalities.  Has completed workup.   Thrombocytopenia -Questionable cause.  Resolved   Macrocytic anemia Possible iron deficiency anemia. -B12 and folate normal. -IV iron was ordered. Continue iron supplements.   History of unspecified CVA Hyperlipidemia -No deficits.  Continue aspirin and statin   Diabetes mellitus type 2 with hyperglycemia -A1c 5.  Resume home medications at discharge.   NAFLD/cirrhosis -Appears compensated.  Outpatient follow-up with GI   History of breast cancer -Outpatient follow-up with oncology   Physical deconditioning -Seen by PT and OT.  Rehabilitation is being pursued.  Moderate protein calorie malnutrition Nutrition Problem: Moderate Malnutrition Etiology: acute illness  Goals of  Care -Initially pursuing short-term rehab, however insurance authorization with peer-to-peer was denied secondary to patient being able to ambulate 75 feet with CGA.  Patient was to be discharged home with home health however discussion with the patient's daughter stating that the patient is unable to care for herself at home safely and is requesting an expedited appeal for SNF.   The daughter is very concerned about the wellbeing of her mother and likely unsafe to be home without extra aid.  Patient lives with her husband who is also frail and unable to help.  She is adamant about pursuing short-term rehab.  Working closely with case management on available options and would hold off on discharging today given the concern for unsafe disposition.      Subjective: Patient resting comfortably in bed, working with physical therapy.  Denies any shortness of breath, chest pain, nausea, vomiting, abdominal pain.  Does admit to a cough this morning but no purulence or worsening shortness of breath.  Physical Exam: Vitals:   10/30/23 0400 10/30/23 0712 10/30/23 0917 10/30/23 1056  BP:  (!) 132/55 (!) 122/48 (!) 117/52  Pulse: 82 79 84 77  Resp: (!) 24 20  16   Temp:  98.5 F (36.9 C)    TempSrc:  Oral    SpO2: 95% 98%  96%  Weight:      Height:       GENERAL:  Alert, pleasant, no acute distress  HEENT:  EOMI CARDIOVASCULAR:  RRR, no murmurs appreciated RESPIRATORY: Mild cough, clear to auscultation, no wheezing, rales, or rhonchi GASTROINTESTINAL:  Soft, nontender, nondistended EXTREMITIES:  No LE edema bilaterally NEURO:  No new focal deficits appreciated SKIN:  No rashes noted PSYCH:  Appropriate mood and affect   Data Reviewed:  There are no new results to review at this time.  Family Communication: No family at bedside  Disposition: Status is: Inpatient Remains inpatient appropriate because: Unsafe disposition  Planned Discharge Destination: Barriers to discharge: Expedited appeal  for SNF    Time spent: 33 minutes  Author: Deanna Artis, DO 10/30/2023 1:17 PM  For on call review [email or web address].

## 2023-10-30 NOTE — Progress Notes (Signed)
 Physical Therapy Treatment Patient Details Name: Anita Mccormick MRN: [id number] DOB: 1945-09-30 Today's Date: 10/30/2023   History of Present Illness Pt is 78 yo presenting to [facility] ED on 3/18 due to worsening SOB, orthopnea and wheezing with residual cough since COVID. In ED, pt with syncopal event and later PEA bradycardic/asystolic arrest with ROSC after 3 rounds of ACLS. 3/18-3/23 ICU stay. 3/18-3/22 intubation. 3/25 cardiac catheterization. PMH: HTN, HLD, CVA, DM, CKD II, HFEF, anemia, breast cancer, NAFLD/cirrhosis and COVID 08/2023    PT Comments  Pt in recliner upon arrival and agreeable to PT session. Pt continues to be limited by decreased activity tolerance, decreased endurance, and impaired balance/strength. Pt was able to stand with CGA and RW, however, was only able to ambulate ~30 ft before needing a standing rest break. Pt has increased difficulty navigating obstacles and turning, requiring cues for safety and increased time. At this time, pt does not have 24/7 physical assist available at home, and would benefit from <3hrs post acute rehab to work towards independence with mobility. Pt is progressing well towards goals. Acute PT to follow.      If plan is discharge home, recommend the following: A little help with walking and/or transfers;Assist for transportation;Help with stairs or ramp for entrance;Assistance with cooking/housework;A little help with bathing/dressing/bathroom   Can travel by private vehicle     Yes  Equipment Recommendations  Rolling walker (2 wheels);BSC/3in1;Wheelchair (measurements PT);Wheelchair cushion (measurements PT)       Precautions / Restrictions Precautions Precautions: Fall Recall of Precautions/Restrictions: Intact Restrictions Weight Bearing Restrictions Per Provider Order: No     Mobility  Bed Mobility  General bed mobility comments: in recliner upon arrival    Transfers Overall transfer level: Needs assistance Equipment used: Rolling  walker (2 wheels) Transfers: Sit to/from Stand Sit to Stand: Contact guard assist    General transfer comment: CGA for safety, cues for hand placement with RW    Ambulation/Gait Ambulation/Gait assistance: Contact guard assist Gait Distance (Feet): 30 Feet (x30, x30) Assistive device: Rolling walker (2 wheels) Gait Pattern/deviations: Step-to pattern, Decreased step length - right, Decreased step length - left, Trunk flexed, Wide base of support, Shuffle Gait velocity: decreased     General Gait Details: short and steady gait with decreased foot clearance. Increased difficulty managing RW with turns and navigating obstacles in the room. Cues for RW proximity during turns    Balance Overall balance assessment: Needs assistance Sitting-balance support: Feet supported, No upper extremity supported Sitting balance-Leahy Scale: Fair     Standing balance support: Bilateral upper extremity supported, During functional activity, Reliant on assistive device for balance Standing balance-Leahy Scale: Poor Standing balance comment: reliant on RW     Communication Communication Communication: No apparent difficulties Factors Affecting Communication: Reduced clarity of speech (soft spoken)  Cognition Arousal: Alert Behavior During Therapy: WFL for tasks assessed/performed   PT - Cognitive impairments: No apparent impairments    Following commands: Intact      Cueing Cueing Techniques: Verbal cues  Exercises General Exercises - Lower Extremity Long Arc Quad: Seated, Both, 10 reps, Strengthening (3 sec hold) Hip Flexion/Marching: AROM, Both, 10 reps, Seated    General Comments General comments (skin integrity, edema, etc.): VSS on RA      Pertinent Vitals/Pain Pain Assessment Pain Assessment: Faces Faces Pain Scale: Hurts little more Pain Location: chest with coughing Pain Descriptors / Indicators: Aching, Discomfort Pain Intervention(s): Limited activity within patient's  tolerance, Monitored during session, Repositioned  PT Goals (current goals can now be found in the care plan section) Acute Rehab PT Goals Patient Stated Goal: to get stronger PT Goal Formulation: With patient Time For Goal Achievement: 11/13/23 Potential to Achieve Goals: Good Progress towards PT goals: Progressing toward goals    Frequency    Min 2X/week       AM-PAC PT "6 Clicks" Mobility   Outcome Measure  Help needed turning from your back to your side while in a flat bed without using bedrails?: A Little Help needed moving from lying on your back to sitting on the side of a flat bed without using bedrails?: A Little Help needed moving to and from a bed to a chair (including a wheelchair)?: A Little Help needed standing up from a chair using your arms (e.g., wheelchair or bedside chair)?: A Little Help needed to walk in hospital room?: A Little Help needed climbing 3-5 steps with a railing? : Total 6 Click Score: 16    End of Session Equipment Utilized During Treatment: Gait belt Activity Tolerance: Patient tolerated treatment well Patient left: in chair;with call bell/phone within reach;with family/visitor present;with chair alarm set Nurse Communication: Mobility status PT Visit Diagnosis: Unsteadiness on feet (R26.81);Other abnormalities of gait and mobility (R26.89)     Time: 1610-9604 PT Time Calculation (min) (ACUTE ONLY): 14 min  Charges:    $Gait Training: 8-22 mins PT General Charges $$ ACUTE PT VISIT: 1 Visit                     Hilton Cork, PT, DPT Secure Chat Preferred  Rehab Office [phone]   Arturo Morton Brion Aliment 10/30/2023, 12:11 PM

## 2023-10-30 NOTE — Plan of Care (Signed)
  Problem: Education: Goal: Ability to describe self-care measures that may prevent or decrease complications (Diabetes Survival Skills Education) will improve Outcome: Progressing   Problem: Fluid Volume: Goal: Ability to maintain a balanced intake and output will improve Outcome: Progressing   Problem: Health Behavior/Discharge Planning: Goal: Ability to manage health-related needs will improve Outcome: Progressing   Problem: Nutritional: Goal: Maintenance of adequate nutrition will improve Outcome: Progressing   Problem: Skin Integrity: Goal: Risk for impaired skin integrity will decrease Outcome: Progressing   Problem: Tissue Perfusion: Goal: Adequacy of tissue perfusion will improve Outcome: Progressing

## 2023-10-30 NOTE — TOC Progression Note (Signed)
 Transition of Care [facility]) - Progression Note    Patient Details  Name: Anita Mccormick MRN: [id number] Date of Birth: 12-Oct-1945  Transition of [facility]) CM/SW Contact  Michaela Corner, [place] Phone Number: 10/30/2023, 11:18 AM  Clinical Narrative:   CSW called Aram Beecham [phone]) with [employer] about appeal status and additional clinicals sent yesterday 4/3. Per Aram Beecham, clinicals have gone up for review and the person to start the appeal (patients dtr) will be the one to receive a determination.   TOC will continue to follow.    Expected Discharge Plan: Skilled Nursing Facility Barriers to Discharge: Continued Medical Work up  Expected Discharge Plan and Services   Discharge Planning Services: CM Consult Post Acute Care Choice: Skilled Nursing Facility Living arrangements for the past 2 months: Single Family Home                                       Social Determinants of Health (SDOH) Interventions SDOH Screenings   Food Insecurity: No Food Insecurity (10/13/2023)  Housing: Low Risk  (10/13/2023)  Transportation Needs: No Transportation Needs (10/13/2023)  Utilities: Not At Risk (10/13/2023)  Alcohol Screen: Low Risk  (10/03/2022)  Depression (PHQ2-9): Low Risk  (07/06/2023)  Financial Resource Strain: Low Risk  (10/03/2022)  Physical Activity: Inactive (10/03/2022)  Social Connections: Moderately Isolated (10/13/2023)  Stress: No Stress Concern Present (10/03/2022)  Tobacco Use: High Risk (10/13/2023)    Readmission Risk Interventions    10/19/2023   10:43 AM  Readmission Risk Prevention Plan  Post Dischage Appt Complete  Medication Screening Complete  Transportation Screening Complete

## 2023-10-31 DIAGNOSIS — J9601 Acute respiratory failure with hypoxia: Secondary | ICD-10-CM | POA: Diagnosis not present

## 2023-10-31 LAB — CBC
HCT: 26.5 % — ABNORMAL LOW (ref 36.0–46.0)
Hemoglobin: 8.3 g/dL — ABNORMAL LOW (ref 12.0–15.0)
MCH: 33.3 pg (ref 26.0–34.0)
MCHC: 31.3 g/dL (ref 30.0–36.0)
MCV: 106.4 fL — ABNORMAL HIGH (ref 80.0–100.0)
Platelets: 245 10*3/uL (ref 150–400)
RBC: 2.49 MIL/uL — ABNORMAL LOW (ref 3.87–5.11)
RDW: 14.9 % (ref 11.5–15.5)
WBC: 5.5 10*3/uL (ref 4.0–10.5)
nRBC: 0 % (ref 0.0–0.2)

## 2023-10-31 LAB — GLUCOSE, CAPILLARY
Glucose-Capillary: 191 mg/dL — ABNORMAL HIGH (ref 70–99)
Glucose-Capillary: 278 mg/dL — ABNORMAL HIGH (ref 70–99)
Glucose-Capillary: 254 mg/dL — ABNORMAL HIGH (ref 70–99)
Glucose-Capillary: 210 mg/dL — ABNORMAL HIGH (ref 70–99)

## 2023-10-31 LAB — BASIC METABOLIC PANEL WITH GFR
Anion gap: 9 (ref 5–15)
BUN: 11 mg/dL (ref 8–23)
CO2: 26 mmol/L (ref 22–32)
Calcium: 9 mg/dL (ref 8.9–10.3)
Chloride: 104 mmol/L (ref 98–111)
Creatinine, Ser: 0.9 mg/dL (ref 0.44–1.00)
GFR, Estimated: >60 mL/min (ref >60)
Glucose, Bld: 192 mg/dL — ABNORMAL HIGH (ref 70–99)
Potassium: 3.7 mmol/L (ref 3.5–5.1)
Sodium: 139 mmol/L (ref 135–145)

## 2023-10-31 LAB — MAGNESIUM: Magnesium: 1.8 mg/dL (ref 1.7–2.4)

## 2023-10-31 NOTE — Plan of Care (Signed)
  Problem: Skin Integrity: Goal: Risk for impaired skin integrity will decrease Outcome: Progressing   Problem: Tissue Perfusion: Goal: Adequacy of tissue perfusion will improve Outcome: Progressing   Problem: Activity: Goal: Ability to tolerate increased activity will improve Outcome: Progressing   Problem: Respiratory: Goal: Ability to maintain a clear airway and adequate ventilation will improve Outcome: Progressing   Problem: Clinical Measurements: Goal: Will remain free from infection Outcome: Progressing Goal: Respiratory complications will improve Outcome: Progressing Goal: Cardiovascular complication will be avoided Outcome: Progressing   Problem: Activity: Goal: Risk for activity intolerance will decrease Outcome: Progressing   Problem: Elimination: Goal: Will not experience complications related to bowel motility Outcome: Progressing Goal: Will not experience complications related to urinary retention Outcome: Progressing   Problem: Safety: Goal: Ability to remain free from injury will improve Outcome: Progressing

## 2023-10-31 NOTE — Progress Notes (Signed)
 Progress Note   Patient: Anita Mccormick OZH:[id number] DOB: [birth year] DOA: 10/13/2023     18 DOS: the patient was seen and examined on 10/31/2023   Brief hospital course: 78 year old female with history of tobacco abuse, hypertension, hyperlipidemia, unspecified CVA, diabetes mellitus type 2, CKD stage II, chronic diastolic heart failure, anemia, breast cancer, NAFLD/cirrhosis and COVID-19 infection in 08/2023 presented with worsening shortness of breath. On presentation, she required supplemental oxygen and was found to be influenza A positive; chest x-ray was negative for acute infiltrates. She was started on Tamiflu. Subsequently, had a syncopal event and was found to be more hypoxic. Later on, she was found to be in PEA bradycardic/asystolic arrest with ROSC after 3 rounds of ACLS. She was subsequently intubated and transferred to ICU. PCCM and cardiology were consulted. She was then found to have left pneumothorax for which pigtail catheter was placed. She also needed vasopressors for shock along with IV antibiotics. Troponins trended to more than 24,000. She was started on heparin drip. Echo showed EF of 60 to 65% with no valvular issues. She was also started on IV Solu-Medrol by PCCM. Subsequently, vasopressors were stopped. She was extubated on 10/17/2023.Chest tube was subsequently removed. She was transferred to [facility] service from 10/18/2023 onwards. Now doing great, ready for DC.  Assessment and Plan:  PEA cardiac arrest CAD/n-STEMI chronic diastolic heart failure Probable cardiogenic shock: Resolved -With ROSC after 3 rounds of CPR.  Required ICU care with vasopressors/ventilatory support/broad-spectrum antibiotics.  Subsequently extubated and weaned off vasopressors.  Transferred to [facility] service from 10/18/2023 onwards.  Troponins trended to more than 24,000.  Completed hep drip.  Patient was seen by cardiology.  Underwent cardiac catheterization on 3/25.  Triple-vessel disease was noted but none  of them amenable to PCI.  Not thought to be a good candidate for bypass surgery.  Medical management is being pursued.  She is noted to be on aspirin, amlodipine, statin.  No further cardiac workup at this time.  Cardiology has signed off.   Influenza A pneumonia COPD exacerbation Acute respiratory failure with hypoxia -Treated with Tamiflu, steroids for 5 days.  Also completed 5 days of Rocephin.  Has been weaned off of oxygen.  Seen by speech therapy and no concerns identified for aspiration.   She was subsequently started on doxycycline with improvement in WBC.  Symptoms have also improved.  She is saturating normal on room air.  7-day course of doxycycline.  Appears resolved.  Responded well to trial of IV Lasix.  Robitussin/Mucinex on board.   Mildly abnormal LFTs -Mildly elevated AST and ALT likely related to her cardiac arrest.  No tenderness in the right upper quadrant.  Levels have improved.     Pneumothorax -Required pigtail catheter placement which has subsequently been removed.  Resolved.   Essential hypertension  -currently on amlodipine and metoprolol.  Continue current regimen.   Syncope -Echo and cardiac catheterization as above.  Telemetry with no abnormalities.  Has completed workup.   Thrombocytopenia -Questionable cause.  Resolved   Macrocytic anemia Possible iron deficiency anemia. -B12 and folate normal. -IV iron was ordered. Continue iron supplements.   History of unspecified CVA Hyperlipidemia -No deficits.  Continue aspirin and statin   Diabetes mellitus type 2 with hyperglycemia -A1c 5.  Resume home medications at discharge.   NAFLD/cirrhosis -Appears compensated.  Outpatient follow-up with GI   History of breast cancer -Outpatient follow-up with oncology   Physical deconditioning -Seen by PT and OT.  Rehabilitation is being pursued.  Moderate protein calorie malnutrition Nutrition Problem: Moderate Malnutrition Etiology: acute  illness  Goals of Care -Initially pursuing short-term rehab, however insurance authorization with peer-to-peer was denied secondary to patient being able to ambulate 75 feet with CGA.  Patient was to be discharged home with home health however discussion with the patient's daughter stating that the patient is unable to care for herself at home safely and is requesting an expedited appeal for SNF.   The daughter is very concerned about the wellbeing of her mother and likely unsafe to be home without extra aid.  Patient lives with her husband who is also frail and unable to help.  She is adamant about pursuing short-term rehab.  Working closely with case management on available options and would hold off on discharging today given the concern for unsafe disposition.      Subjective: Patient resting comfortably in bed.  Denies any shortness of breath, chest pain, nausea, vomiting, abdominal pain.  States she feels improved from yesterday, improved cough.  Physical Exam: Vitals:   10/31/23 0357 10/31/23 0358 10/31/23 0806 10/31/23 1124  BP:   (!) 130/47 115/61  Pulse: 81 85 87 85  Resp: 16 19 (!) 24 (!) 21  Temp:   98.6 F (37 C) 98.1 F (36.7 C)  TempSrc:   Oral Oral  SpO2: 100% 96% 95% 97%  Weight:      Height:       GENERAL:  Alert, pleasant, no acute distress  HEENT:  EOMI CARDIOVASCULAR:  RRR, no murmurs appreciated RESPIRATORY: clear to auscultation, no wheezing, rales, or rhonchi GASTROINTESTINAL:  Soft, nontender, nondistended EXTREMITIES:  No LE edema bilaterally NEURO:  No new focal deficits appreciated SKIN:  No rashes noted PSYCH:  Appropriate mood and affect   Data Reviewed:  There are no new results to review at this time.  Family Communication: No family at bedside  Disposition: Status is: Inpatient Remains inpatient appropriate because: Unsafe disposition  Planned Discharge Destination: Barriers to discharge: Expedited appeal for SNF    Time spent: 32  minutes  Author: Deanna Artis, DO 10/31/2023 2:01 PM  For on call review [email or web address].

## 2023-10-31 NOTE — Progress Notes (Signed)
 Mobility Specialist Progress Note;   10/31/23 0855  Mobility  Activity Ambulated with assistance in hallway;Transferred from bed to chair  Level of Assistance Contact guard assist, steadying assist  Assistive Device Front wheel walker  Distance Ambulated (ft) 50 ft  Activity Response Tolerated well  Mobility Referral Yes  Mobility visit 1 Mobility  Mobility Specialist Start Time (ACUTE ONLY) 0855  Mobility Specialist Stop Time (ACUTE ONLY) 0915  Mobility Specialist Time Calculation (min) (ACUTE ONLY) 20 min   Pt agreeable to mobility. Required MinG assistance during ambulation for safety. VSS throughout and no c/o when asked. Pt requested to sit in chair at EOS. Pt left in chair with all needs met, alarm on. RN in room.   Caesar Bookman Mobility Specialist Please contact via SecureChat or [employer] [phone]

## 2023-10-31 NOTE — Plan of Care (Signed)

## 2023-11-01 DIAGNOSIS — J9601 Acute respiratory failure with hypoxia: Secondary | ICD-10-CM | POA: Diagnosis not present

## 2023-11-01 LAB — GLUCOSE, CAPILLARY
Glucose-Capillary: 203 mg/dL — ABNORMAL HIGH (ref 70–99)
Glucose-Capillary: 306 mg/dL — ABNORMAL HIGH (ref 70–99)
Glucose-Capillary: 313 mg/dL — ABNORMAL HIGH (ref 70–99)
Glucose-Capillary: 171 mg/dL — ABNORMAL HIGH (ref 70–99)

## 2023-11-01 NOTE — Progress Notes (Signed)
 Progress Note   Patient: Anita Mccormick NGE:[id number] DOB: 22-Jan-1946 DOA: 10/13/2023     19 DOS: the patient was seen and examined on 11/01/2023   Brief hospital course: 78 year old female with history of tobacco abuse, hypertension, hyperlipidemia, unspecified CVA, diabetes mellitus type 2, CKD stage II, chronic diastolic heart failure, anemia, breast cancer, NAFLD/cirrhosis and COVID-19 infection in 08/2023 presented with worsening shortness of breath. On presentation, she required supplemental oxygen and was found to be influenza A positive; chest x-ray was negative for acute infiltrates. She was started on Tamiflu. Subsequently, had a syncopal event and was found to be more hypoxic. Later on, she was found to be in PEA bradycardic/asystolic arrest with ROSC after 3 rounds of ACLS. She was subsequently intubated and transferred to ICU. PCCM and cardiology were consulted. She was then found to have left pneumothorax for which pigtail catheter was placed. She also needed vasopressors for shock along with IV antibiotics. Troponins trended to more than 24,000. She was started on heparin drip. Echo showed EF of 60 to 65% with no valvular issues. She was also started on IV Solu-Medrol by PCCM. Subsequently, vasopressors were stopped. She was extubated on 10/17/2023.Chest tube was subsequently removed. She was transferred to [facility] service from 10/18/2023 onwards. Now doing great, ready for DC.  Assessment and Plan:  PEA cardiac arrest CAD/n-STEMI chronic diastolic heart failure Probable cardiogenic shock: Resolved -With ROSC after 3 rounds of CPR.  Required ICU care with vasopressors/ventilatory support/broad-spectrum antibiotics.  Subsequently extubated and weaned off vasopressors.  Transferred to [facility] service from 10/18/2023 onwards.  Troponins trended to more than 24,000.  Completed hep drip.  Patient was seen by cardiology.  Underwent cardiac catheterization on 3/25.  Triple-vessel disease was noted but none  of them amenable to PCI.  Not thought to be a good candidate for bypass surgery.  Medical management is being pursued.  She is noted to be on aspirin, amlodipine, statin.  No further cardiac workup at this time.  Cardiology has signed off.   Influenza A pneumonia COPD exacerbation Acute respiratory failure with hypoxia -Treated with Tamiflu, steroids for 5 days.  Also completed 5 days of Rocephin.  Has been weaned off of oxygen.  Seen by speech therapy and no concerns identified for aspiration.   She was subsequently started on doxycycline with improvement in WBC.  Symptoms have also improved.  She is saturating normal on room air.  7-day course of doxycycline.  Appears resolved.  Responded well to trial of IV Lasix.  Robitussin/Mucinex on board.   Mildly abnormal LFTs -Mildly elevated AST and ALT likely related to her cardiac arrest.  No tenderness in the right upper quadrant.  Levels have improved.     Pneumothorax -Required pigtail catheter placement which has subsequently been removed.  Resolved.   Essential hypertension  -currently on amlodipine and metoprolol.  Continue current regimen.   Syncope -Echo and cardiac catheterization as above.  Telemetry with no abnormalities.  Has completed workup.   Thrombocytopenia -Questionable cause.  Resolved   Macrocytic anemia Possible iron deficiency anemia. -B12 and folate normal. -IV iron was ordered. Continue iron supplements.   History of unspecified CVA Hyperlipidemia -No deficits.  Continue aspirin and statin   Diabetes mellitus type 2 with hyperglycemia -A1c 5.  Resume home medications at discharge.   NAFLD/cirrhosis -Appears compensated.  Outpatient follow-up with GI   History of breast cancer -Outpatient follow-up with oncology   Physical deconditioning -Seen by PT and OT.  Rehabilitation is being pursued.  Moderate protein calorie malnutrition Nutrition Problem: Moderate Malnutrition Etiology: acute  illness  Goals of Care -Initially pursuing short-term rehab, however insurance authorization with peer-to-peer was denied secondary to patient being able to ambulate 75 feet with CGA.  Patient was to be discharged home with home health however discussion with the patient's daughter stating that the patient is unable to care for herself at home safely and is requesting an expedited appeal for SNF.   The daughter is very concerned about the wellbeing of her mother and likely unsafe to be home without extra aid.  Patient lives with her husband who is also frail and unable to help.  She is adamant about pursuing short-term rehab.  Working closely with case management on available options and would hold off on discharging today given the concern for unsafe disposition.      Subjective: Patient resting comfortably in bed.  Denies any shortness of breath, chest pain, nausea, vomiting, abdominal pain.  States she has improved cough.  Physical Exam: Vitals:   11/01/23 0522 11/01/23 0901 11/01/23 0920 11/01/23 1056  BP:   (!) 122/51 (!) 131/52  Pulse: 85  88 82  Resp: 17  20 20   Temp:    98 F (36.7 C)  TempSrc:    Oral  SpO2: 96% 98% 98% 98%  Weight: 60.7 kg     Height: 5\' 2"  (1.575 m)      GENERAL:  Alert, pleasant, no acute distress  HEENT:  EOMI CARDIOVASCULAR:  RRR, no murmurs appreciated RESPIRATORY: clear to auscultation, no wheezing, rales, or rhonchi GASTROINTESTINAL:  Soft, nontender, nondistended EXTREMITIES:  No LE edema bilaterally NEURO:  No new focal deficits appreciated SKIN:  No rashes noted PSYCH:  Appropriate mood and affect   Data Reviewed:  There are no new results to review at this time.  Family Communication: No family at bedside  Disposition: Status is: Inpatient Remains inpatient appropriate because: Unsafe disposition  Planned Discharge Destination: Barriers to discharge: Expedited appeal for SNF    Time spent: 31 minutes  Author: Deanna Artis,  DO 11/01/2023 11:18 AM  For on call review [email or web address].

## 2023-11-01 NOTE — Plan of Care (Signed)
  Problem: Coping: Goal: Ability to adjust to condition or change in health will improve Outcome: Progressing   Problem: Skin Integrity: Goal: Risk for impaired skin integrity will decrease Outcome: Progressing   Problem: Tissue Perfusion: Goal: Adequacy of tissue perfusion will improve Outcome: Progressing   Problem: Activity: Goal: Ability to tolerate increased activity will improve Outcome: Progressing   Problem: Respiratory: Goal: Ability to maintain a clear airway and adequate ventilation will improve Outcome: Progressing   Problem: Clinical Measurements: Goal: Will remain free from infection Outcome: Progressing Goal: Respiratory complications will improve Outcome: Progressing Goal: Cardiovascular complication will be avoided Outcome: Progressing

## 2023-11-01 NOTE — Progress Notes (Signed)
 Mobility Specialist Progress Note;   11/01/23 0903  Mobility  Activity Ambulated with assistance in hallway  Level of Assistance Contact guard assist, steadying assist  Assistive Device Front wheel walker  Distance Ambulated (ft) 50 ft  Activity Response Tolerated well  Mobility Referral Yes  Mobility visit 1 Mobility  Mobility Specialist Start Time (ACUTE ONLY) [id number]  Mobility Specialist Stop Time (ACUTE ONLY) [id number]  Mobility Specialist Time Calculation (min) (ACUTE ONLY) 15 min   Pt eager for mobility. Required MinG assistance during ambulation for safety. HR up to 109 bpm w/ activity. No c/o when asked. Pt requested to sit in chair at EOS. Pt left comfortably in chair with all needs met, alarm on. RN in room.   Anita Mccormick Mobility Specialist Please contact via SecureChat or [employer] [phone]

## 2023-11-01 NOTE — Progress Notes (Signed)
 Mobility Specialist Progress Note;   11/01/23 1440  Mobility  Activity Ambulated with assistance in room  Level of Assistance Contact guard assist, steadying assist  Assistive Device Front wheel walker  Distance Ambulated (ft) 15 ft  Activity Response Tolerated well  Mobility Referral Yes  Mobility visit 1 Mobility  Mobility Specialist Start Time (ACUTE ONLY) 1440  Mobility Specialist Stop Time (ACUTE ONLY) 1450  Mobility Specialist Time Calculation (min) (ACUTE ONLY) 10 min   Pt requesting to get up from chair and "stretch" a bit d/t feeling stiff. Required MinG assistance to safely ambulate around pts rooms, pt deferred hallway ambulation. VSS throughout. Pt returned back to chair with all needs met, alarm on. RN in room.   Caesar Bookman Mobility Specialist Please contact via SecureChat or [employer] [phone]

## 2023-11-02 DIAGNOSIS — J9601 Acute respiratory failure with hypoxia: Secondary | ICD-10-CM | POA: Diagnosis not present

## 2023-11-02 LAB — GLUCOSE, CAPILLARY
Glucose-Capillary: 224 mg/dL — ABNORMAL HIGH (ref 70–99)
Glucose-Capillary: 369 mg/dL — ABNORMAL HIGH (ref 70–99)
Glucose-Capillary: 179 mg/dL — ABNORMAL HIGH (ref 70–99)
Glucose-Capillary: 251 mg/dL — ABNORMAL HIGH (ref 70–99)

## 2023-11-02 MED ORDER — INSULIN GLARGINE-YFGN 100 UNIT/ML ~~LOC~~ SOLN
6.0000 [IU] | Freq: Every day | SUBCUTANEOUS | Status: DC
  Administered 2023-11-02 – 2023-11-03 (×2): 6 [IU] via SUBCUTANEOUS
  Filled 2023-11-02 (×3): qty 0.06

## 2023-11-02 NOTE — Progress Notes (Signed)
 Progress Note   Patient: Anita Mccormick:[id number] DOB: [birth year] DOA: 10/13/2023     20 DOS: the patient was seen and examined on 11/02/2023   Brief hospital course: 78 year old female with history of tobacco abuse, hypertension, hyperlipidemia, unspecified CVA, diabetes mellitus type 2, CKD stage II, chronic diastolic heart failure, anemia, breast cancer, NAFLD/cirrhosis and COVID-19 infection in 08/2023 presented with worsening shortness of breath. On presentation, she required supplemental oxygen and was found to be influenza A positive; chest x-ray was negative for acute infiltrates. She was started on Tamiflu. Subsequently, had a syncopal event and was found to be more hypoxic. Later on, she was found to be in PEA bradycardic/asystolic arrest with ROSC after 3 rounds of ACLS. She was subsequently intubated and transferred to ICU. PCCM and cardiology were consulted. She was then found to have left pneumothorax for which pigtail catheter was placed. She also needed vasopressors for shock along with IV antibiotics. Troponins trended to more than 24,000. She was started on heparin drip. Echo showed EF of 60 to 65% with no valvular issues. She was also started on IV Solu-Medrol by PCCM. Subsequently, vasopressors were stopped. She was extubated on 10/17/2023.Chest tube was subsequently removed. She was transferred to [facility] service from 10/18/2023 onwards. Now doing great, ready for DC.  Assessment and Plan:  PEA cardiac arrest CAD/n-STEMI chronic diastolic heart failure Probable cardiogenic shock: Resolved -With ROSC after 3 rounds of CPR.  Required ICU care with vasopressors/ventilatory support/broad-spectrum antibiotics.  Subsequently extubated and weaned off vasopressors.  Transferred to [facility] service from 10/18/2023 onwards.  Troponins trended to more than 24,000.  Completed hep drip.  Patient was seen by cardiology.  Underwent cardiac catheterization on 3/25.  Triple-vessel disease was noted but none  of them amenable to PCI.  Not thought to be a good candidate for bypass surgery.  Medical management is being pursued.  She is noted to be on aspirin, amlodipine, statin.  No further cardiac workup at this time.  Cardiology has signed off.   Influenza A pneumonia COPD exacerbation Acute respiratory failure with hypoxia -Treated with Tamiflu, steroids for 5 days.  Also completed 5 days of Rocephin.  Has been weaned off of oxygen.  Seen by speech therapy and no concerns identified for aspiration.   She was subsequently started on doxycycline with improvement in WBC.  Symptoms have also improved.  She is saturating normal on room air.  7-day course of doxycycline.  Appears resolved.  Responded well to trial of IV Lasix.  Robitussin/Mucinex on board.   Mildly abnormal LFTs -Mildly elevated AST and ALT likely related to her cardiac arrest.  No tenderness in the right upper quadrant.  Levels have improved.     Pneumothorax -Required pigtail catheter placement which has subsequently been removed.  Resolved.   Essential hypertension  -currently on amlodipine and metoprolol.  Continue current regimen.   Syncope -Echo and cardiac catheterization as above.  Telemetry with no abnormalities.  Has completed workup.   Thrombocytopenia -Questionable cause.  Resolved   Macrocytic anemia Possible iron deficiency anemia. -B12 and folate normal. -IV iron was ordered. Continue iron supplements.   History of unspecified CVA Hyperlipidemia -No deficits.  Continue aspirin and statin   Diabetes mellitus type 2 with hyperglycemia -A1c 5.  Resume home medications at discharge.   NAFLD/cirrhosis -Appears compensated.  Outpatient follow-up with GI   History of breast cancer -Outpatient follow-up with oncology   Physical deconditioning -Seen by PT and OT.  Rehabilitation is being pursued.  Moderate protein calorie malnutrition Nutrition Problem: Moderate Malnutrition Etiology: acute  illness  Goals of Care -Initially pursuing short-term rehab, however insurance authorization with peer-to-peer was denied secondary to patient being able to ambulate 75 feet with CGA.  Patient was to be discharged home with home health however discussion with the patient's daughter stating that the patient is unable to care for herself at home safely and is requesting an expedited appeal for SNF.   The daughter is very concerned about the wellbeing of her mother and likely unsafe to be home without extra aid.  Patient lives with her husband who is also frail and unable to help.  She is adamant about pursuing short-term rehab.  Working closely with case management on available options and would hold off on discharging today given the concern for unsafe disposition.      Subjective: Patient resting comfortably in bed.  Denies any shortness of breath, chest pain, nausea, vomiting, abdominal pain.  States she has improved cough.  Physical Exam: Vitals:   11/02/23 0537 11/02/23 0720 11/02/23 0825 11/02/23 1110  BP:  (!) 108/52 (!) 118/56 (!) 103/47  Pulse:  89 90 89  Resp:  (!) 26 18 17   Temp:  98 F (36.7 C) 98 F (36.7 C) 98 F (36.7 C)  TempSrc:  Oral Oral Oral  SpO2:  98% 95% 100%  Weight: 60.6 kg     Height:       GENERAL:  Alert, pleasant, no acute distress  HEENT:  EOMI CARDIOVASCULAR:  RRR, no murmurs appreciated RESPIRATORY: clear to auscultation, no wheezing, rales, or rhonchi GASTROINTESTINAL:  Soft, nontender, nondistended EXTREMITIES:  No LE edema bilaterally NEURO:  No new focal deficits appreciated SKIN:  No rashes noted PSYCH:  Appropriate mood and affect   Data Reviewed:  There are no new results to review at this time.  Family Communication: No family at bedside  Disposition: Status is: Inpatient Remains inpatient appropriate because: Unsafe disposition  Planned Discharge Destination: Barriers to discharge: Expedited appeal for SNF    Time spent: 32  minutes  Author: Deanna Artis, DO 11/02/2023 2:21 PM  For on call review [email or web address].

## 2023-11-02 NOTE — Plan of Care (Signed)
  Problem: Fluid Volume: Goal: Ability to maintain a balanced intake and output will improve Outcome: Progressing   Problem: Activity: Goal: Ability to tolerate increased activity will improve Outcome: Progressing   Problem: Clinical Measurements: Goal: Ability to maintain clinical measurements within normal limits will improve Outcome: Progressing

## 2023-11-02 NOTE — Progress Notes (Signed)
 Occupational Therapy Treatment Patient Details Name: Anita Mccormick MRN: [id number] DOB: 1945/10/11 Today's Date: 11/02/2023   History of present illness Pt is 78 yo presenting to [facility] ED on 3/18 due to worsening SOB, orthopnea and wheezing with residual cough since COVID. In ED, pt with syncopal event and later PEA bradycardic/asystolic arrest with ROSC after 3 rounds of ACLS. 3/18-3/23 ICU stay. 3/18-3/22 intubation. 3/25 cardiac catheterization. PMH: HTN, HLD, CVA, DM, CKD II, HFEF, anemia, breast cancer, NAFLD/cirrhosis and COVID 08/2023   OT comments  Pt eager to work on ADLs with OT. Up in chair and reports comfort. Completed grooming with set up, UB bathing with assist for back and LB with mod assist. Donned fresh gown with set up and socks with set up. Improved ability to complete pericare. Pt continues to fatigue easily, but did not require rest breaks. Pt with potential to function at a supervision level in ADLs. Updated goals to reflect progress. Patient will benefit from continued inpatient follow up therapy, <3 hours/day prior to return home with supportive husband.       If plan is discharge home, recommend the following:  A little help with walking and/or transfers;A lot of help with bathing/dressing/bathroom;Assistance with cooking/housework;Assistance with feeding;Assist for transportation;Help with stairs or ramp for entrance   Equipment Recommendations  None recommended by OT    Recommendations for Other Services      Precautions / Restrictions Precautions Precautions: Fall Recall of Precautions/Restrictions: Intact Restrictions Weight Bearing Restrictions Per Provider Order: No       Mobility Bed Mobility               General bed mobility comments: in recliner upon arrival    Transfers Overall transfer level: Needs assistance Equipment used: Rolling walker (2 wheels) Transfers: Sit to/from Stand Sit to Stand: Contact guard assist                  Balance Overall balance assessment: Needs assistance Sitting-balance support: Feet supported, No upper extremity supported Sitting balance-Leahy Scale: Fair       Standing balance-Leahy Scale: Poor Standing balance comment: reliant on RW for ambulation and balance with pericare                           ADL either performed or assessed with clinical judgement   ADL Overall ADL's : Needs assistance/impaired     Grooming: Oral care;Wash/dry hands;Wash/dry face;Sitting;Set up   Upper Body Bathing: Minimal assistance;Sitting Upper Body Bathing Details (indicate cue type and reason): assist for back Lower Body Bathing: Maximal assistance;Sit to/from stand   Upper Body Dressing : Set up;Sitting   Lower Body Dressing: Set up;Sitting/lateral leans Lower Body Dressing Details (indicate cue type and reason): socks     Toileting- Clothing Manipulation and Hygiene: Moderate assistance;Sit to/from stand Toileting - Clothing Manipulation Details (indicate cue type and reason): assist for posterior care, pt washed front            Extremity/Trunk Assessment              Vision       Perception     Praxis     Communication Communication Communication: No apparent difficulties   Cognition Arousal: Alert Behavior During Therapy: WFL for tasks assessed/performed Cognition: Cognition impaired     Awareness: Intellectual awareness impaired, Online awareness impaired Memory impairment (select all impairments): Short-term memory     OT - Cognition Comments: pt participating and motivated  to complete ADLs this visit                 Following commands: Intact        Cueing   Cueing Techniques: Verbal cues  Exercises      Shoulder Instructions       General Comments      Pertinent Vitals/ Pain       Pain Assessment Pain Assessment: Faces Faces Pain Scale: Hurts a little bit Pain Location: feet and under pannus with washing Pain Descriptors  / Indicators: Grimacing, Guarding Pain Intervention(s): Monitored during session, Repositioned  Home Living                                          Prior Functioning/Environment              Frequency  Min 2X/week        Progress Toward Goals  OT Goals(current goals can now be found in the care plan section)  Progress towards OT goals: Progressing toward goals  Acute Rehab OT Goals OT Goal Formulation: With patient Time For Goal Achievement: 11/15/23 Potential to Achieve Goals: Good ADL Goals Pt Will Perform Grooming: with supervision;standing (at least 2 activities) Pt Will Perform Lower Body Bathing: with supervision;sit to/from stand Pt Will Perform Upper Body Dressing: with set-up;sitting Pt Will Perform Lower Body Dressing: with supervision;sit to/from stand Pt Will Transfer to Toilet: with supervision;ambulating;bedside commode Pt Will Perform Toileting - Clothing Manipulation and hygiene: with supervision;sit to/from stand  Plan      Co-evaluation                 AM-PAC OT "6 Clicks" Daily Activity     Outcome Measure   Help from another person eating meals?: A Little Help from another person taking care of personal grooming?: A Little Help from another person toileting, which includes using toliet, bedpan, or urinal?: A Lot Help from another person bathing (including washing, rinsing, drying)?: A Lot Help from another person to put on and taking off regular upper body clothing?: A Little Help from another person to put on and taking off regular lower body clothing?: A Lot 6 Click Score: 15    End of Session Equipment Utilized During Treatment: Rolling walker (2 wheels)  OT Visit Diagnosis: Unsteadiness on feet (R26.81);Other abnormalities of gait and mobility (R26.89);Muscle weakness (generalized) (M62.81);Other symptoms and signs involving cognitive function   Activity Tolerance Patient tolerated treatment well   Patient  Left in chair;with call bell/phone within reach   Nurse Communication          Time: 4034-7425 OT Time Calculation (min): 28 min  Charges: OT General Charges $OT Visit: 1 Visit OT Treatments $Self Care/Home Management : 23-37 mins  Berna Spare, OTR/L Acute Rehabilitation Services Office: [phone]  Evern Bio 11/02/2023, 9:52 AM

## 2023-11-02 NOTE — Care Management Important Message (Signed)
 Important Message  Patient Details  Name: Anita Mccormick MRN: [id number] Date of Birth: 09/20/1945   Important Message Given:  Yes - Medicare IM     Renie Ora 11/02/2023, 9:22 AM

## 2023-11-02 NOTE — Inpatient Diabetes Management (Signed)
 Inpatient Diabetes Program Recommendations  AACE/ADA: New Consensus Statement on Inpatient Glycemic Control (2015)  Target Ranges:  Prepandial:   less than 140 mg/dL      Peak postprandial:   less than 180 mg/dL (1-2 hours)      Critically ill patients:  140 - 180 mg/dL    Latest Reference Range & Units 10/13/23 18:53  Hemoglobin A1C 4.8 - 5.6 % 5.0    Latest Reference Range & Units 11/01/23 06:16 11/01/23 11:06 11/01/23 15:51 11/01/23 21:29  Glucose-Capillary 70 - 99 mg/dL 323 (H)  7 units Novolog  306 (H)  13 units Novolog  313 (H)  13 units Novolog  171 (H)  (H): Data is abnormally high  Latest Reference Range & Units 11/02/23 06:05  Glucose-Capillary 70 - 99 mg/dL 557 (H)  7 units Novolog   (H): Data is abnormally high   Admit with: Acute respiratory failure with hypoxia/ Influenza A Suffered PEA Cardiac Arrest  History: DM2, CKD, CHF  Home DM Meds: Dexcom G7 CGM       Trulicity 1.5 mg Qweek       Glipizide 10 mg BID  Current Orders: Novolog 2 units TID with meals     Novolog Moderate Correction Scale/ SSI (0-15 units) TID AC + HS     MD- Note AM CBGs have been >200 the last 2 days Having higher Novolog SSI requirements  Please consider adding low dose basal insulin while home DM meds are on hold: Semglee 6 units daily (0.1 units/kg)    --Will follow patient during hospitalization--  Ambrose Finland RN, MSN, CDCES Diabetes Coordinator Inpatient Glycemic Control Team Team Pager: [phone] (8a-5p)

## 2023-11-02 NOTE — Progress Notes (Signed)
 Nutrition Follow-up  DOCUMENTATION CODES:   Non-severe (moderate) malnutrition in context of acute illness/injury  INTERVENTION:  Continue regular diet as ordered.   Continue MVI with minerals.  Continue offering high protein snacks between meals.   NUTRITION DIAGNOSIS:   Moderate Malnutrition related to acute illness as evidenced by mild fat depletion, mild muscle depletion.  GOAL:   Patient will meet greater than or equal to 90% of their needs *improving  MONITOR:   PO intake, Supplement acceptance  REASON FOR ASSESSMENT:   Ventilator    ASSESSMENT:   78 y.o female with PMH of breast cancer, HTN, HLD, CVA, left eye blindness, T2DM, FALD/cirrhosis, GERD, anemia, CKD 2, Covid 08/2023. Presented with worsening SOB. Found to be Influenza A positive. While awaiting bed placement had cardiac arrest and was intubated.  RD met with pt in room. Pt reports eating well and having good appetite. Nursing documenting 100% completions.   Medications reviewed and include: Lasix, SSI 0-15 units, MVI  Labs reviewed: Phosphorus 2.1 L, Albumin 2.5 L, CBGs 171-369  Weights reviewed.    Diet Order:   Diet Order             Diet - low sodium heart healthy           Diet regular Room service appropriate? Yes; Fluid consistency: Thin  Diet effective now                   EDUCATION NEEDS:   Not appropriate for education at this time  Skin:  Skin Assessment: Reviewed RN Assessment  Last BM:  3/31  Height:   Ht Readings from Last 1 Encounters:  11/01/23 5\' 2"  (1.575 m)    Weight:   Wt Readings from Last 1 Encounters:  11/02/23 60.6 kg    Ideal Body Weight:  50 kg  BMI:  Body mass index is 24.42 kg/m.  Estimated Nutritional Needs:   Kcal:  1500-1700 kcal  Protein:  75-95 gm  Fluid:  >1.5L/day  Kathrynn Speed, MPH, RD, LDN Clinical Dietitian Contact information can be found at [facility].

## 2023-11-02 NOTE — TOC Progression Note (Addendum)
 Transition of Care [facility]) - Progression Note    Patient Details  Name: Anita Mccormick MRN: [id number] Date of Birth: [birth year]  Transition of Care [facility]) CM/SW Contact  Michaela Corner, [place] Phone Number: 11/02/2023, 2:41 PM  Clinical Narrative:   12:22PM CSW reached out to patients dtr, Anita Mccormick, regarding the status of patients appeal. CSW has not heard back.   2:44 PM CSW called Anita Mccormick with [employer]  to check on appeal status. Per Anita Mccormick, she is one of the nurses and cannot see the status of the appeal.  3:36 PM CSW spoke with [place] regarding appeal. Anita Mccormick stated she will call insurance for an update.   TOC will continue to follow.    Expected Discharge Plan: Skilled Nursing Facility Barriers to Discharge: Continued Medical Work up  Expected Discharge Plan and Services   Discharge Planning Services: CM Consult Post Acute Care Choice: Skilled Nursing Facility Living arrangements for the past 2 months: Single Family Home                                       Social Determinants of Health (SDOH) Interventions SDOH Screenings   Food Insecurity: No Food Insecurity (10/13/2023)  Housing: Low Risk  (10/13/2023)  Transportation Needs: No Transportation Needs (10/13/2023)  Utilities: Not At Risk (10/13/2023)  Alcohol Screen: Low Risk  (10/03/2022)  Depression (PHQ2-9): Low Risk  (07/06/2023)  Financial Resource Strain: Low Risk  (10/03/2022)  Physical Activity: Inactive (10/03/2022)  Social Connections: Moderately Isolated (10/13/2023)  Stress: No Stress Concern Present (10/03/2022)  Tobacco Use: High Risk (10/13/2023)    Readmission Risk Interventions    10/19/2023   10:43 AM  Readmission Risk Prevention Plan  Post Dischage Appt Complete  Medication Screening Complete  Transportation Screening Complete

## 2023-11-03 DIAGNOSIS — J9601 Acute respiratory failure with hypoxia: Secondary | ICD-10-CM | POA: Diagnosis not present

## 2023-11-03 LAB — GLUCOSE, CAPILLARY
Glucose-Capillary: 224 mg/dL — ABNORMAL HIGH (ref 70–99)
Glucose-Capillary: 224 mg/dL — ABNORMAL HIGH (ref 70–99)
Glucose-Capillary: 324 mg/dL — ABNORMAL HIGH (ref 70–99)
Glucose-Capillary: 193 mg/dL — ABNORMAL HIGH (ref 70–99)

## 2023-11-03 NOTE — TOC Progression Note (Addendum)
 Transition of Care [facility]) - Progression Note    Patient Details  Name: Anita Mccormick MRN: [id number] Date of Birth: 06-29-1946  Transition [facility] Care [facility]) CM/SW Contact  Michaela Corner, [place] Phone Number: 11/03/2023, 10:34 AM  Clinical Narrative:   [employer] left CSW a voicemail last night @ 5:43PM regarding the appeal. CSW returned the call and left a VM for [employer] at 8:52AM.   10:40 AM CSW spoke with patients dtr and she stated insurance called her for the Appointment of Rep form again. Per Lynnell Catalan, she resubmitted the form.   12:20 PM CSW spoke with Insurance (Phone [phone] ) and according to them, Appointment of Rep form was not correctly filed out. They reached out to patients daughter and the form has been corrected. Per the rep, it will take another 72 hours to hear back regarding a decision.   TOC will continue to follow.    Expected Discharge Plan: Skilled Nursing Facility Barriers to Discharge: Continued Medical Work up  Expected Discharge Plan and Services   Discharge Planning Services: CM Consult Post Acute Care Choice: Skilled Nursing Facility Living arrangements for the past 2 months: Single Family Home                                       Social Determinants of Health (SDOH) Interventions SDOH Screenings   Food Insecurity: No Food Insecurity (10/13/2023)  Housing: Low Risk  (10/13/2023)  Transportation Needs: No Transportation Needs (10/13/2023)  Utilities: Not At Risk (10/13/2023)  Alcohol Screen: Low Risk  (10/03/2022)  Depression (PHQ2-9): Low Risk  (07/06/2023)  Financial Resource Strain: Low Risk  (10/03/2022)  Physical Activity: Inactive (10/03/2022)  Social Connections: Moderately Isolated (10/13/2023)  Stress: No Stress Concern Present (10/03/2022)  Tobacco Use: High Risk (10/13/2023)    Readmission Risk Interventions    10/19/2023   10:43 AM  Readmission Risk Prevention Plan  Post Dischage Appt Complete  Medication Screening  Complete  Transportation Screening Complete

## 2023-11-03 NOTE — Progress Notes (Signed)
 Physical Therapy Treatment Patient Details Name: Anita Mccormick MRN: [id number] DOB: November 01, 1945 Today's Date: 11/03/2023   History of Present Illness Pt is 78 yo presenting to [facility] ED on 3/18 due to worsening SOB, orthopnea and wheezing with residual cough since COVID. In ED, pt with syncopal event and later PEA bradycardic/asystolic arrest with ROSC after 3 rounds of ACLS. 3/18-3/23 ICU stay. 3/18-3/22 intubation. 3/25 cardiac catheterization. PMH: HTN, HLD, CVA, DM, CKD II, HFEF, anemia, breast cancer, NAFLD/cirrhosis and COVID 08/2023    PT Comments  Pt progressing towards all goals. Pt continues with generalized deconditioning from prolonged hospital stay requiring freq standing rest breaks when ambulating. Pt did amb 100' with RW this date and is motivated to improve walking ability. Acute PT to cont to follow.    If plan is discharge home, recommend the following: A little help with walking and/or transfers;Assist for transportation;Help with stairs or ramp for entrance;Assistance with cooking/housework;A little help with bathing/dressing/bathroom   Can travel by private vehicle     Yes  Equipment Recommendations  Rolling walker (2 wheels);BSC/3in1;Wheelchair (measurements PT);Wheelchair cushion (measurements PT)    Recommendations for Other Services       Precautions / Restrictions Precautions Precautions: Fall Recall of Precautions/Restrictions: Intact Restrictions Weight Bearing Restrictions Per Provider Order: No     Mobility  Bed Mobility               General bed mobility comments: in recliner upon arrival    Transfers Overall transfer level: Needs assistance Equipment used: Rolling walker (2 wheels) Transfers: Sit to/from Stand Sit to Stand: Contact guard assist   Step pivot transfers: Contact guard assist       General transfer comment: CGA for safety, cues for hand placement with RW    Ambulation/Gait Ambulation/Gait assistance: Contact guard  assist Gait Distance (Feet): 100 Feet Assistive device: Rolling walker (2 wheels) Gait Pattern/deviations: Step-to pattern, Decreased step length - right, Decreased step length - left, Trunk flexed, Wide base of support, Shuffle Gait velocity: decreased Gait velocity interpretation: <1.31 ft/sec, indicative of household ambulator   General Gait Details: short step height and length but steady. 4 standing rest breaks. Pt c/o "phew I'm so tired.". Pt denies pain   Stairs             Wheelchair Mobility     Tilt Bed    Modified Rankin (Stroke Patients Only)       Balance Overall balance assessment: Needs assistance Sitting-balance support: Feet supported, No upper extremity supported Sitting balance-Leahy Scale: Fair Sitting balance - Comments: Pt sat EOB with supervision. She attempted to address pericare in sitting, reaching outside her BOS.   Standing balance support: Bilateral upper extremity supported, During functional activity, Reliant on assistive device for balance Standing balance-Leahy Scale: Poor Standing balance comment: reliant on RW for ambulation and balance with pericare                            Communication Communication Communication: No apparent difficulties Factors Affecting Communication: Reduced clarity of speech (soft spoken)  Cognition Arousal: Alert Behavior During Therapy: WFL for tasks assessed/performed   PT - Cognitive impairments: No apparent impairments                         Following commands: Intact      Cueing Cueing Techniques: Verbal cues  Exercises General Exercises - Lower Extremity Ankle Circles/Pumps: AROM, Both,  Seated Quad Sets: AROM, Both, 10 reps, Seated (with LEs elevated)    General Comments General comments (skin integrity, edema, etc.): VSS on RA, HR up to 122bpm during ambulation. Pt assisted to [facility], contact guard for hygiene      Pertinent Vitals/Pain Pain Assessment Pain  Assessment: No/denies pain    Home Living                          Prior Function            PT Goals (current goals can now be found in the care plan section) Acute Rehab PT Goals Patient Stated Goal: to get stronger PT Goal Formulation: With patient Time For Goal Achievement: 11/13/23 Potential to Achieve Goals: Good Progress towards PT goals: Progressing toward goals    Frequency    Min 2X/week      PT Plan      Co-evaluation              AM-PAC PT "6 Clicks" Mobility   Outcome Measure  Help needed turning from your back to your side while in a flat bed without using bedrails?: A Little Help needed moving from lying on your back to sitting on the side of a flat bed without using bedrails?: A Little Help needed moving to and from a bed to a chair (including a wheelchair)?: A Little Help needed standing up from a chair using your arms (e.g., wheelchair or bedside chair)?: A Little Help needed to walk in hospital room?: A Little Help needed climbing 3-5 steps with a railing? : Total 6 Click Score: 16    End of Session Equipment Utilized During Treatment: Gait belt Activity Tolerance: Patient tolerated treatment well Patient left: in chair;with call bell/phone within reach;with family/visitor present;with chair alarm set Nurse Communication: Mobility status PT Visit Diagnosis: Unsteadiness on feet (R26.81);Other abnormalities of gait and mobility (R26.89)     Time: 0981-1914 PT Time Calculation (min) (ACUTE ONLY): 27 min  Charges:    $Gait Training: 8-22 mins $Therapeutic Exercise: 8-22 mins PT General Charges $$ ACUTE PT VISIT: 1 Visit                     Lewis Shock, PT, DPT Acute Rehabilitation Services Secure chat preferred Office #: [phone]    Iona Hansen 11/03/2023, 1:38 PM

## 2023-11-03 NOTE — Plan of Care (Signed)
  Problem: Clinical Measurements: Goal: Respiratory complications will improve Outcome: Progressing Goal: Cardiovascular complication will be avoided Outcome: Progressing   Problem: Elimination: Goal: Will not experience complications related to bowel motility Outcome: Progressing Goal: Will not experience complications related to urinary retention Outcome: Progressing   Problem: Safety: Goal: Ability to remain free from injury will improve Outcome: Progressing

## 2023-11-03 NOTE — Progress Notes (Signed)
 Progress Note   Patient: Anita Mccormick MVH:[id number] DOB: August 13, 1945 DOA: 10/13/2023     21 DOS: the patient was seen and examined on 11/03/2023   Brief hospital course: 78 year old female with history of tobacco abuse, hypertension, hyperlipidemia, unspecified CVA, diabetes mellitus type 2, CKD stage II, chronic diastolic heart failure, anemia, breast cancer, NAFLD/cirrhosis and COVID-19 infection in 08/2023 presented with worsening shortness of breath. On presentation, she required supplemental oxygen and was found to be influenza A positive; chest x-ray was negative for acute infiltrates. She was started on Tamiflu. Subsequently, had a syncopal event and was found to be more hypoxic. Later on, she was found to be in PEA bradycardic/asystolic arrest with ROSC after 3 rounds of ACLS. She was subsequently intubated and transferred to ICU. PCCM and cardiology were consulted. She was then found to have left pneumothorax for which pigtail catheter was placed. She also needed vasopressors for shock along with IV antibiotics. Troponins trended to more than 24,000. She was started on heparin drip. Echo showed EF of 60 to 65% with no valvular issues. She was also started on IV Solu-Medrol by PCCM. Subsequently, vasopressors were stopped. She was extubated on 10/17/2023.Chest tube was subsequently removed. She was transferred to [facility] service from 10/18/2023 onwards. Now doing great, ready for DC.  Assessment and Plan:  PEA cardiac arrest CAD/n-STEMI chronic diastolic heart failure Probable cardiogenic shock: Resolved -With ROSC after 3 rounds of CPR.  Required ICU care with vasopressors/ventilatory support/broad-spectrum antibiotics.  Subsequently extubated and weaned off vasopressors.  Transferred to [facility] service from 10/18/2023 onwards.  Troponins trended to more than 24,000.  Completed hep drip.  Patient was seen by cardiology.  Underwent cardiac catheterization on 3/25.  Triple-vessel disease was noted but none  of them amenable to PCI.  Not thought to be a good candidate for bypass surgery.  Medical management is being pursued.  She is noted to be on aspirin, amlodipine, statin.  No further cardiac workup at this time.  Cardiology has signed off.   Influenza A pneumonia COPD exacerbation Acute respiratory failure with hypoxia -Treated with Tamiflu, steroids for 5 days.  Also completed 5 days of Rocephin.  Has been weaned off of oxygen.  Seen by speech therapy and no concerns identified for aspiration.   She was subsequently started on doxycycline with improvement in WBC.  Symptoms have also improved.  She is saturating normal on room air.  7-day course of doxycycline.  Appears resolved.  Responded well to trial of IV Lasix.  Robitussin/Mucinex on board.   Mildly abnormal LFTs -Mildly elevated AST and ALT likely related to her cardiac arrest.  No tenderness in the right upper quadrant.  Levels have improved.     Pneumothorax -Required pigtail catheter placement which has subsequently been removed.  Resolved.   Essential hypertension  -currently on amlodipine and metoprolol.  Continue current regimen.   Syncope -Echo and cardiac catheterization as above.  Telemetry with no abnormalities.  Has completed workup.   Thrombocytopenia -Questionable cause.  Resolved   Macrocytic anemia Possible iron deficiency anemia. -B12 and folate normal. -IV iron was ordered. Continue iron supplements.   History of unspecified CVA Hyperlipidemia -No deficits.  Continue aspirin and statin   Diabetes mellitus type 2 with hyperglycemia -A1c 5.  Resume home medications at discharge.   NAFLD/cirrhosis -Appears compensated.  Outpatient follow-up with GI   History of breast cancer -Outpatient follow-up with oncology   Physical deconditioning -Seen by PT and OT.  Rehabilitation is being pursued.  Moderate protein calorie malnutrition Nutrition Problem: Moderate Malnutrition Etiology: acute  illness  Goals of Care -Initially pursuing short-term rehab, however insurance authorization with peer-to-peer was denied secondary to patient being able to ambulate 75 feet with CGA.  Patient was to be discharged home with home health however discussion with the patient's daughter stating that the patient is unable to care for herself at home safely and is requesting an expedited appeal for SNF.   The daughter is very concerned about the wellbeing of her mother and likely unsafe to be home without extra aid.  Patient lives with her husband who is also frail and unable to help.  She is adamant about pursuing short-term rehab.  Working closely with case management on available options and would hold off on discharging today given the concern for unsafe disposition.  As able to talk to the daughter today, still awaiting decision from insurance appeal.  Discussed possibility of going home if the appeal is denied versus paying out-of-pocket for skilled care or aide at home.      Subjective: Patient resting comfortably in bed.  Denies any shortness of breath, chest pain, nausea, vomiting, abdominal pain.  States she has improved cough.  As able to talk to the daughter today at bedside, still awaiting decision from insurance appeal.  Discussed possibility of going home if the appeal is denied versus paying out-of-pocket for skilled care or aide at home.  Physical Exam: Vitals:   11/03/23 0427 11/03/23 0622 11/03/23 0737 11/03/23 1044  BP: (!) 119/55  (!) 121/51 (!) 135/51  Pulse: 100  99 96  Resp: (!) 22 15 17 19   Temp: 98.8 F (37.1 C)  99.2 F (37.3 C) 98.5 F (36.9 C)  TempSrc: Oral  Oral Oral  SpO2: 100%  100% 100%  Weight:  59.9 kg    Height:  5\' 2"  (1.575 m)     GENERAL:  Alert, pleasant, no acute distress  HEENT:  EOMI CARDIOVASCULAR:  RRR, no murmurs appreciated RESPIRATORY: clear to auscultation, no wheezing, rales, or rhonchi GASTROINTESTINAL:  Soft, nontender,  nondistended EXTREMITIES:  No LE edema bilaterally NEURO:  No new focal deficits appreciated SKIN:  No rashes noted PSYCH:  Appropriate mood and affect   Data Reviewed:  There are no new results to review at this time.  Family Communication: No family at bedside  Disposition: Status is: Inpatient Remains inpatient appropriate because: Unsafe disposition  Planned Discharge Destination: Barriers to discharge: Expedited appeal for SNF    Time spent: 32 minutes  Author: Deanna Artis, DO 11/03/2023 12:59 PM  For on call review [email or web address].

## 2023-11-04 ENCOUNTER — Ambulatory Visit: Admitting: Nurse Practitioner

## 2023-11-04 DIAGNOSIS — Z853 Personal history of malignant neoplasm of breast: Secondary | ICD-10-CM

## 2023-11-04 DIAGNOSIS — K7460 Unspecified cirrhosis of liver: Secondary | ICD-10-CM | POA: Diagnosis not present

## 2023-11-04 DIAGNOSIS — E440 Moderate protein-calorie malnutrition: Secondary | ICD-10-CM | POA: Diagnosis not present

## 2023-11-04 DIAGNOSIS — J9601 Acute respiratory failure with hypoxia: Secondary | ICD-10-CM | POA: Diagnosis not present

## 2023-11-04 DIAGNOSIS — D649 Anemia, unspecified: Secondary | ICD-10-CM

## 2023-11-04 LAB — GLUCOSE, CAPILLARY
Glucose-Capillary: 223 mg/dL — ABNORMAL HIGH (ref 70–99)
Glucose-Capillary: 310 mg/dL — ABNORMAL HIGH (ref 70–99)
Glucose-Capillary: 190 mg/dL — ABNORMAL HIGH (ref 70–99)
Glucose-Capillary: 243 mg/dL — ABNORMAL HIGH (ref 70–99)

## 2023-11-04 MED ORDER — INSULIN ASPART 100 UNIT/ML IJ SOLN
4.0000 [IU] | Freq: Three times a day (TID) | INTRAMUSCULAR | Status: DC
  Administered 2023-11-04 – 2023-11-05 (×5): 4 [IU] via SUBCUTANEOUS

## 2023-11-04 MED ORDER — INSULIN GLARGINE-YFGN 100 UNIT/ML ~~LOC~~ SOLN
10.0000 [IU] | Freq: Every day | SUBCUTANEOUS | Status: DC
  Administered 2023-11-04 – 2023-11-07 (×4): 10 [IU] via SUBCUTANEOUS
  Filled 2023-11-04 (×4): qty 0.1

## 2023-11-04 NOTE — Progress Notes (Signed)
 Occupational Therapy Treatment Patient Details Name: Anita Mccormick MRN: [id number] DOB: November 09, 1945 Today's Date: 11/04/2023   History of present illness Pt is 78 yo presenting to [facility] ED on 3/18 due to worsening SOB, orthopnea and wheezing with residual cough since COVID. In ED, pt with syncopal event and later PEA bradycardic/asystolic arrest with ROSC after 3 rounds of ACLS. 3/18-3/23 ICU stay. 3/18-3/22 intubation. 3/25 cardiac catheterization. PMH: HTN, HLD, CVA, DM, CKD II, HFEF, anemia, breast cancer, NAFLD/cirrhosis and COVID 08/2023   OT comments  Pt making good progress with functional goals. Pt participated in functional mobility walking to bathroom with RW, toilet transfers, toileting tasks, grooming/hygiene standing at sink, simulated bathing and lb dressing tasks. Pt very pleasant and cooperative. OT will continue to follow acutely to maximize level of function and safety      If plan is discharge home, recommend the following:  A little help with walking and/or transfers;A lot of help with bathing/dressing/bathroom;Assistance with cooking/housework;Assistance with feeding;Assist for transportation;Help with stairs or ramp for entrance   Equipment Recommendations  None recommended by OT    Recommendations for Other Services      Precautions / Restrictions Precautions Precautions: Fall Recall of Precautions/Restrictions: Intact Restrictions Weight Bearing Restrictions Per Provider Order: No       Mobility Bed Mobility               General bed mobility comments: in recliner upon arrival    Transfers Overall transfer level: Needs assistance Equipment used: Rolling walker (2 wheels) Transfers: Sit to/from Stand Sit to Stand: Min assist, Contact guard assist     Step pivot transfers: Contact guard assist     General transfer comment: CGA for safety, cues for hand placement with RW     Balance Overall balance assessment: Needs assistance Sitting-balance  support: Feet supported, No upper extremity supported Sitting balance-Leahy Scale: Fair     Standing balance support: Bilateral upper extremity supported, During functional activity, Reliant on assistive device for balance Standing balance-Leahy Scale: Poor                             ADL either performed or assessed with clinical judgement   ADL Overall ADL's : Needs assistance/impaired     Grooming: Wash/dry hands;Wash/dry face;Contact guard assist;Standing       Lower Body Bathing: Moderate assistance;Sit to/from stand Lower Body Bathing Details (indicate cue type and reason): simulated     Lower Body Dressing: Set up;Sitting/lateral leans Lower Body Dressing Details (indicate cue type and reason): socks and slippers Toilet Transfer: Contact guard assist;Ambulation;Rolling walker (2 wheels);Cueing for safety;Regular Toilet;BSC/3in1;Grab bars   Toileting- Clothing Manipulation and Hygiene: Moderate assistance;Sit to/from stand Toileting - Clothing Manipulation Details (indicate cue type and reason): assist for posterior care, pt washed front     Functional mobility during ADLs: Contact guard assist;Rolling walker (2 wheels)      Extremity/Trunk Assessment Upper Extremity Assessment Upper Extremity Assessment: Generalized weakness   Lower Extremity Assessment Lower Extremity Assessment: Defer to PT evaluation   Cervical / Trunk Assessment Cervical / Trunk Assessment: Normal    Vision Ability to See in Adequate Light: 0 Adequate Patient Visual Report: No change from baseline     Perception     Praxis     Communication     Cognition Arousal: Alert Behavior During Therapy: [facility] for tasks assessed/performed  OT - Cognition Comments: pt participating and motivated to complete ADLs this visit                          Cueing      Exercises      Shoulder Instructions       General Comments      Pertinent Vitals/  Pain       Pain Assessment Pain Assessment: No/denies pain Pain Intervention(s): Monitored during session, Repositioned  Home Living                                          Prior Functioning/Environment              Frequency  Min 2X/week        Progress Toward Goals  OT Goals(current goals can now be found in the care plan section)  Progress towards OT goals: Progressing toward goals     Plan      Co-evaluation                 AM-PAC OT "6 Clicks" Daily Activity     Outcome Measure   Help from another person eating meals?: None Help from another person taking care of personal grooming?: A Little Help from another person toileting, which includes using toliet, bedpan, or urinal?: A Lot Help from another person bathing (including washing, rinsing, drying)?: A Lot Help from another person to put on and taking off regular upper body clothing?: A Little Help from another person to put on and taking off regular lower body clothing?: A Lot 6 Click Score: 16    End of Session Equipment Utilized During Treatment: Rolling walker (2 wheels);Gait belt;Other (comment) (3 in 1 over toilet)  OT Visit Diagnosis: Unsteadiness on feet (R26.81);Other abnormalities of gait and mobility (R26.89);Muscle weakness (generalized) (M62.81);Other symptoms and signs involving cognitive function   Activity Tolerance Patient tolerated treatment well   Patient Left in chair;with call bell/phone within reach   Nurse Communication          Time: 1354-1430 OT Time Calculation (min): 36 min  Charges: OT General Charges $OT Visit: 1 Visit OT Treatments $Self Care/Home Management : 8-22 mins $Therapeutic Activity: 8-22 mins   Galen Manila 11/04/2023, 3:24 PM

## 2023-11-04 NOTE — Plan of Care (Signed)
  Problem: Skin Integrity: Goal: Risk for impaired skin integrity will decrease Outcome: Progressing   Problem: Activity: Goal: Ability to tolerate increased activity will improve Outcome: Progressing   Problem: Elimination: Goal: Will not experience complications related to bowel motility Outcome: Progressing

## 2023-11-04 NOTE — Plan of Care (Signed)
   Problem: Education: Goal: Ability to describe self-care measures that may prevent or decrease complications (Diabetes Survival Skills Education) will improve Outcome: Progressing   Problem: Coping: Goal: Ability to adjust to condition or change in health will improve Outcome: Progressing   Problem: Tissue Perfusion: Goal: Adequacy of tissue perfusion will improve Outcome: Progressing

## 2023-11-04 NOTE — TOC Progression Note (Signed)
 Transition of Care [facility]) - Progression Note    Patient Details  Name: MARISHA RENIER MRN: [id number] Date of Birth: 29-Jan-1946  Transition of Care [facility]) CM/SW Contact  Michaela Corner, [place] Phone Number: 11/04/2023, 4:19 PM  Clinical Narrative:   CSW called appeal and grievances about status of appeal. Per the representative, a decision has not been made yet.   TOC will continue to follow.    Expected Discharge Plan: Skilled Nursing Facility Barriers to Discharge: Continued Medical Work up  Expected Discharge Plan and Services   Discharge Planning Services: CM Consult Post Acute Care Choice: Skilled Nursing Facility Living arrangements for the past 2 months: Single Family Home                                       Social Determinants of Health (SDOH) Interventions SDOH Screenings   Food Insecurity: No Food Insecurity (10/13/2023)  Housing: Low Risk  (10/13/2023)  Transportation Needs: No Transportation Needs (10/13/2023)  Utilities: Not At Risk (10/13/2023)  Alcohol Screen: Low Risk  (10/03/2022)  Depression (PHQ2-9): Low Risk  (07/06/2023)  Financial Resource Strain: Low Risk  (10/03/2022)  Physical Activity: Inactive (10/03/2022)  Social Connections: Moderately Isolated (10/13/2023)  Stress: No Stress Concern Present (10/03/2022)  Tobacco Use: High Risk (10/13/2023)    Readmission Risk Interventions    10/19/2023   10:43 AM  Readmission Risk Prevention Plan  Post Dischage Appt Complete  Medication Screening Complete  Transportation Screening Complete

## 2023-11-04 NOTE — Progress Notes (Signed)
 PROGRESS NOTE    Anita Mccormick  ZOX:[id number] DOB: 15-May-1946 DOA: 10/13/2023 PCP: Deeann Saint, MD    Chief Complaint  Patient presents with   Shortness of Breath   Cough   Cardiac Arrest    Brief Narrative:  78 year old female with history of tobacco abuse, hypertension, hyperlipidemia, unspecified CVA, diabetes mellitus type 2, CKD stage II, chronic diastolic heart failure, anemia, breast cancer, NAFLD/cirrhosis and COVID-19 infection in 08/2023 presented with worsening shortness of breath. On presentation, she required supplemental oxygen and was found to be influenza A positive; chest x-ray was negative for acute infiltrates. She was started on Tamiflu. Subsequently, had a syncopal event and was found to be more hypoxic. Later on, she was found to be in PEA bradycardic/asystolic arrest with ROSC after 3 rounds of ACLS. She was subsequently intubated and transferred to ICU. PCCM and cardiology were consulted. She was then found to have left pneumothorax for which pigtail catheter was placed. She also needed vasopressors for shock along with IV antibiotics. Troponins trended to more than 24,000. She was started on heparin drip. Echo showed EF of 60 to 65% with no valvular issues. She was also started on IV Solu-Medrol by PCCM. Subsequently, vasopressors were stopped. She was extubated on 10/17/2023.Chest tube was subsequently removed. She was transferred to [facility] service from 10/18/2023 onwards. Now doing great, ready for DC.  Awaiting SNF placement.     Assessment & Plan:   Principal Problem:   Acute respiratory failure with hypoxia (HCC) Active Problems:   Type II diabetes mellitus with renal manifestations (HCC)   Hyperlipidemia   Essential hypertension   History of CVA (cerebrovascular accident)   History of breast cancer   Fatty liver disease, nonalcoholic   Chronic kidney disease (CKD), stage II (mild)   Chronic diastolic CHF (congestive heart failure) (HCC)   Anemia    Cirrhosis of liver without ascites (HCC)   Influenza A   Cardiac arrest (HCC)   Pneumothorax on left   Malnutrition of moderate degree   Non-ST elevation (NSTEMI) myocardial infarction (HCC)  #1 PEA/cardiac arrest/CAD/non-STEMI/chronic diastolic heart failure/probable cardiogenic shock resolved -Patient with ROSC after 3 rounds of CPR. -Patient required ICU care with intubation and vasopressor support and broad-spectrum antibiotics. -Patient subsequently extubated, weaned off pressors and transferred to [facility] service on 10/18/2023. -Troponins noted to be elevated > 24,000. -Patient noted to have received a heparin drip. -Patient seen in consultation by cardiology, underwent cardiac catheterization 10/20/2023 and triple-vessel disease noted but not amenable to PCI. -It was felt per cardiology that patient was not a candidate for bypass surgery and medical management recommended. -Continue aspirin, amlodipine, statin, Toprol-XL. -Cardiology was following but has signed off. -Outpatient follow-up with cardiology.  2.  Acute respiratory failure with hypoxia secondary to Influenza A  pneumonia/COPD exacerbation -Status post 5 days Tamiflu and steroids and IV Rocephin. -Improved clinically and weaned off oxygen currently with sats of 99 to 100% on room air. -Status post 7 days doxycycline with improvement with leukocytosis.   -Patient also received trial of IV Lasix.  3.  Mildly abnormal LFTs -Likely related to cardiac arrest.  4.  Pneumothorax -Status post pigtail catheter placement which has subsequently been removed. -Patient not hypoxic.  5.  Hypertension -Continue amlodipine, Toprol-XL.  6.  Syncope -2D echo and cardiac catheterization as noted above. -Patient with no neurological deficits. -No significant abnormalities noted on telemetry. -Workup completed.  7.  Thrombocytopenia -Resolved.  8.  History of unspecified CVA/hyperlipidemia -No focal neurological  deficits. -Continue aspirin and statin for secondary stroke prophylaxis.  9.  Macrocytic anemia/possible iron deficiency anemia -Status post IV iron. -Vitamin B12, folate normal. -Continue oral iron supplementation.  10.  Well-controlled diabetes mellitus type 2 with hyperglycemia -Hemoglobin A1c 5.0. -CBG 223 this morning. -Increase Semglee to 10 units daily. -Increase NovoLog meal coverage to 4 units 3 times daily.  11.  NAFLD/cirrhosis -Compensated. -Outpatient follow-up with GI.  12.  History of breast cancer -Outpatient follow-up with oncology.  13.  Moderate protein calorie malnutrition -Continue nutritional supplementation.  14.  Debility -PT/OT recommending SNF placement.  15.  Goals of care -Initially pursuing short-term rehab, however insurance authorization with peer-to-peer was denied secondary to patient being able to ambulate 75 feet with CGA.  Patient was to be discharged home with home health however discussion with the patient's daughter stating that the patient is unable to care for herself at home safely and is requesting an expedited appeal for SNF.   The daughter is very concerned about the wellbeing of her mother and likely unsafe to be home without extra aid.  Patient lives with her husband who is also frail and unable to help.  She is adamant about pursuing short-term rehab.  Working closely with case management on available options and would hold off on discharging today given the concern for unsafe disposition.   Dr Sharlene Dory spoke with dauhgter, still awaiting decision from insurance appeal.  Dr Sharlene Dory discussed possibility of going home if the appeal is denied versus paying out-of-pocket for skilled care or aide at home.   DVT prophylaxis: Heparin Code Status: Full Family Communication: Updated patient.  No family at bedside. Disposition: SNF  Status is: Inpatient Remains inpatient appropriate because: Disposition   Consultants:  PCCM Cardiology:  Dr.Thukkani 10/14/2023  Procedures: CT head 10/15/2023 2D echo 10/14/2023 Cardiac catheterization 10/20/2023 Chest tube placement, chest tube removal Oct 22, 2023  Significant Hospital Events: Including procedures, antibiotic start and stop dates in addition to other pertinent events   3/18 admit to [facility] Flu PNA. Syncope. Coded. Tube after. Admit ICU  22-Oct-2023 passed SBT and extubated.  Antimicrobials:  Anti-infectives (From admission, onward)    Start     Dose/Rate Route Frequency Ordered Stop   10/27/23 0000  doxycycline (VIBRA-TABS) 100 MG tablet        100 mg Oral Every 12 hours 10/27/23 0845 10/31/23 2359   10/23/23 1515  doxycycline (VIBRA-TABS) tablet 100 mg        100 mg Oral Every 12 hours 10/23/23 1425 10/30/23 0959   10-22-23 1000  oseltamivir (TAMIFLU) capsule 30 mg        30 mg Oral 2 times daily 10/22/23 0728 10/18/23 2109   10/15/23 1000  oseltamivir (TAMIFLU) capsule 30 mg  Status:  Discontinued        30 mg Per Tube 2 times daily 10/15/23 0735 10-22-2023 0729   10/14/23 1200  metroNIDAZOLE (FLAGYL) IVPB 500 mg  Status:  Discontinued        500 mg 100 mL/hr over 60 Minutes Intravenous Every 12 hours 10/13/23 2357 10/14/23 0935   10/14/23 1030  azithromycin (ZITHROMAX) 500 mg in sodium chloride 0.9 % 250 mL IVPB        500 mg 250 mL/hr over 60 Minutes Intravenous Every 24 hours 10/14/23 0935 10/16/23 1038   10/14/23 1000  oseltamivir (TAMIFLU) capsule 30 mg  Status:  Discontinued        30 mg Per Tube Daily 10/14/23 0935 10/15/23 0735   10/14/23  0000  cefTRIAXone (ROCEPHIN) 2 g in sodium chloride 0.9 % 100 mL IVPB        2 g 200 mL/hr over 30 Minutes Intravenous Every 24 hours 10/13/23 2357 10/17/23 2339   10/13/23 2200  oseltamivir (TAMIFLU) capsule 30 mg  Status:  Discontinued        30 mg Oral 2 times daily 10/13/23 1247 10/13/23 1847   10/13/23 2015  oseltamivir (TAMIFLU) capsule 30 mg  Status:  Discontinued        30 mg Per Tube Daily 10/13/23 1847 10/14/23 0935    10/13/23 1145  oseltamivir (TAMIFLU) capsule 75 mg        75 mg Oral  Once 10/13/23 1133 10/13/23 1141         Subjective: Patient sitting up in chair.  Overall feeling better.  Denies any significant shortness of breath.  Complaining of chest soreness from CPR.  Tolerating current diet.  Friends at bedside.  Objective: Vitals:   11/04/23 0808 11/04/23 0810 11/04/23 1621 11/04/23 1930  BP: 114/66 114/66 (!) 109/50 (!) 114/52  Pulse: (!) 108 (!) 110 95 88  Resp:  16  18  Temp: 98.6 F (37 C)  98.7 F (37.1 C) 98.9 F (37.2 C)  TempSrc: Oral  Oral Oral  SpO2: 99% 99% 100% 100%  Weight:      Height:        Intake/Output Summary (Last 24 hours) at 11/04/2023 2115 Last data filed at 11/04/2023 1700 Gross per 24 hour  Intake 480 ml  Output 600 ml  Net -120 ml   Filed Weights   11/02/23 0537 11/03/23 0622 11/04/23 0416  Weight: 60.6 kg 59.9 kg 60.1 kg    Examination:  General exam: Appears calm and comfortable  Respiratory system: Clear to auscultation. Respiratory effort normal. Cardiovascular system: S1 & S2 heard, RRR. No JVD, murmurs, rubs, gallops or clicks. No pedal edema. Gastrointestinal system: Abdomen is nondistended, soft and nontender. No organomegaly or masses felt. Normal bowel sounds heard. Central nervous system: Alert and oriented. No focal neurological deficits. Extremities: Symmetric 5 x 5 power. Skin: No rashes, lesions or ulcers Psychiatry: Judgement and insight appear normal. Mood & affect appropriate.     Data Reviewed: I have personally reviewed following labs and imaging studies  CBC: Recent Labs  Lab 10/31/23 0255  WBC 5.5  HGB 8.3*  HCT 26.5*  MCV 106.4*  PLT 245    Basic Metabolic Panel: Recent Labs  Lab 10/31/23 0255  NA 139  K 3.7  CL 104  CO2 26  GLUCOSE 192*  BUN 11  CREATININE 0.90  CALCIUM 9.0  MG 1.8    GFR: Estimated Creatinine Clearance: 41.4 mL/min (by C-G formula based on SCr of 0.9 mg/dL).  Liver Function  Tests: No results for input(s): "AST", "ALT", "ALKPHOS", "BILITOT", "PROT", "ALBUMIN" in the last 168 hours.  CBG: Recent Labs  Lab 11/03/23 2111 11/04/23 0643 11/04/23 1115 11/04/23 1550 11/04/23 2104  GLUCAP 193* 223* 310* 190* 243*     No results found for this or any previous visit (from the past 240 hours).       Radiology Studies: No results found.      Scheduled Meds:  amLODipine  5 mg Oral Daily   aspirin EC  81 mg Oral Daily   atorvastatin  80 mg Oral Daily   clopidogrel  75 mg Oral Daily   feeding supplement  1 Container Oral TID BM   heparin  5,000 Units Subcutaneous  Q8H   insulin aspart  0-15 Units Subcutaneous TID WC   insulin aspart  0-5 Units Subcutaneous QHS   insulin aspart  4 Units Subcutaneous TID WC   insulin glargine-yfgn  10 Units Subcutaneous Daily   methocarbamol  500 mg Oral Once   metoprolol succinate  25 mg Oral Daily   mometasone-formoterol  2 puff Inhalation BID   multivitamin with minerals  1 tablet Oral Daily   saccharomyces boulardii  250 mg Oral BID   sodium chloride flush  3 mL Intravenous Q12H   Continuous Infusions:   LOS: 22 days    Time spent: 35 minutes    Ramiro Harvest, MD [facility]   To contact the attending provider between 7A-7P or the covering provider during after hours 7P-7A, please log into the web site [email or web address] and access using universal [place] password for that web site. If you do not have the password, please call the hospital operator.  11/04/2023, 9:15 PM

## 2023-11-04 NOTE — Inpatient Diabetes Management (Signed)
 Inpatient Diabetes Program Recommendations  AACE/ADA: New Consensus Statement on Inpatient Glycemic Control (2015)  Target Ranges:  Prepandial:   less than 140 mg/dL      Peak postprandial:   less than 180 mg/dL (1-2 hours)      Critically ill patients:  140 - 180 mg/dL    Latest Reference Range & Units 11/03/23 06:22 11/03/23 10:51 11/03/23 16:04 11/03/23 21:11  Glucose-Capillary 70 - 99 mg/dL 161 (H)  7 units Novolog  224 (H)  7 units Novolog @1206   6 units Semglee @0905   324 (H)  13 units Novolog  193 (H)  (H): Data is abnormally high  Latest Reference Range & Units 11/04/23 06:43  Glucose-Capillary 70 - 99 mg/dL 096 (H)  (H): Data is abnormally high   Admit with: Acute respiratory failure with hypoxia/ Influenza A Suffered PEA Cardiac Arrest   History: DM2, CKD, CHF   Home DM Meds: Dexcom G7 CGM                             Trulicity 1.5 mg Qweek                             Glipizide 10 mg BID   Current Orders: Novolog 2 units TID with meals                           Novolog Moderate Correction Scale/ SSI (0-15 units) TID AC + HS      Semglee 6 units daily     MD- Please consider while home DM meds are on hold:  1. Increase Semglee to 10 units Daily  2. Increase Novolog Meal Coverage to 4 units TID with meals    --Will follow patient during hospitalization--  Ambrose Finland RN, MSN, CDCES Diabetes Coordinator Inpatient Glycemic Control Team Team Pager: [phone] (8a-5p)

## 2023-11-05 DIAGNOSIS — J9601 Acute respiratory failure with hypoxia: Secondary | ICD-10-CM | POA: Diagnosis not present

## 2023-11-05 DIAGNOSIS — K7460 Unspecified cirrhosis of liver: Secondary | ICD-10-CM | POA: Diagnosis not present

## 2023-11-05 DIAGNOSIS — E440 Moderate protein-calorie malnutrition: Secondary | ICD-10-CM | POA: Diagnosis not present

## 2023-11-05 DIAGNOSIS — Z853 Personal history of malignant neoplasm of breast: Secondary | ICD-10-CM | POA: Diagnosis not present

## 2023-11-05 LAB — GLUCOSE, CAPILLARY
Glucose-Capillary: 180 mg/dL — ABNORMAL HIGH (ref 70–99)
Glucose-Capillary: 320 mg/dL — ABNORMAL HIGH (ref 70–99)
Glucose-Capillary: 243 mg/dL — ABNORMAL HIGH (ref 70–99)
Glucose-Capillary: 190 mg/dL — ABNORMAL HIGH (ref 70–99)

## 2023-11-05 MED ORDER — INSULIN ASPART 100 UNIT/ML IJ SOLN
6.0000 [IU] | Freq: Three times a day (TID) | INTRAMUSCULAR | Status: DC
  Administered 2023-11-06 – 2023-11-07 (×6): 6 [IU] via SUBCUTANEOUS

## 2023-11-05 NOTE — Care Management Important Message (Signed)
 Important Message  Patient Details  Name: Anita Mccormick MRN: [id number] Date of Birth: 02-Dec-1945   Important Message Given:  Yes - Medicare IM     Renie Ora 11/05/2023, 11:13 AM

## 2023-11-05 NOTE — TOC Progression Note (Signed)
 Transition of Care [facility]) - Progression Note    Patient Details  Name: Anita Mccormick MRN: [id number] Date of Birth: 1946/06/30  Transition of Care [facility]) CM/SW Contact  Michaela Corner, [place] Phone Number: 11/05/2023, 3:51 PM  Clinical Narrative:   Awaiting insurance appeal determination.   TOC will continue to follow.    Expected Discharge Plan: Skilled Nursing Facility Barriers to Discharge: Continued Medical Work up  Expected Discharge Plan and Services   Discharge Planning Services: CM Consult Post Acute Care Choice: Skilled Nursing Facility Living arrangements for the past 2 months: Single Family Home                                       Social Determinants of Health (SDOH) Interventions SDOH Screenings   Food Insecurity: No Food Insecurity (10/13/2023)  Housing: Low Risk  (10/13/2023)  Transportation Needs: No Transportation Needs (10/13/2023)  Utilities: Not At Risk (10/13/2023)  Alcohol Screen: Low Risk  (10/03/2022)  Depression (PHQ2-9): Low Risk  (07/06/2023)  Financial Resource Strain: Low Risk  (10/03/2022)  Physical Activity: Inactive (10/03/2022)  Social Connections: Moderately Isolated (10/13/2023)  Stress: No Stress Concern Present (10/03/2022)  Tobacco Use: High Risk (10/13/2023)    Readmission Risk Interventions    10/19/2023   10:43 AM  Readmission Risk Prevention Plan  Post Dischage Appt Complete  Medication Screening Complete  Transportation Screening Complete

## 2023-11-05 NOTE — Progress Notes (Signed)
 PROGRESS NOTE    Anita Mccormick  YQM:[id number] DOB: 1945/09/29 DOA: 10/13/2023 PCP: Deeann Saint, MD    Chief Complaint  Patient presents with   Shortness of Breath   Cough   Cardiac Arrest    Brief Narrative:  78 year old female with history of tobacco abuse, hypertension, hyperlipidemia, unspecified CVA, diabetes mellitus type 2, CKD stage II, chronic diastolic heart failure, anemia, breast cancer, NAFLD/cirrhosis and COVID-19 infection in 08/2023 presented with worsening shortness of breath. On presentation, she required supplemental oxygen and was found to be influenza A positive; chest x-ray was negative for acute infiltrates. She was started on Tamiflu. Subsequently, had a syncopal event and was found to be more hypoxic. Later on, she was found to be in PEA bradycardic/asystolic arrest with ROSC after 3 rounds of ACLS. She was subsequently intubated and transferred to ICU. PCCM and cardiology were consulted. She was then found to have left pneumothorax for which pigtail catheter was placed. She also needed vasopressors for shock along with IV antibiotics. Troponins trended to more than 24,000. She was started on heparin drip. Echo showed EF of 60 to 65% with no valvular issues. She was also started on IV Solu-Medrol by PCCM. Subsequently, vasopressors were stopped. She was extubated on 10/17/2023.Chest tube was subsequently removed. She was transferred to [facility] service from 10/18/2023 onwards. Now doing great, ready for DC.  Awaiting SNF placement.     Assessment & Plan:   Principal Problem:   Acute respiratory failure with hypoxia (HCC) Active Problems:   Type II diabetes mellitus with renal manifestations (HCC)   Hyperlipidemia   Essential hypertension   History of CVA (cerebrovascular accident)   History of breast cancer   Fatty liver disease, nonalcoholic   Chronic kidney disease (CKD), stage II (mild)   Chronic diastolic CHF (congestive heart failure) (HCC)   Anemia    Cirrhosis of liver without ascites (HCC)   Influenza A   Cardiac arrest (HCC)   Pneumothorax on left   Malnutrition of moderate degree   Non-ST elevation (NSTEMI) myocardial infarction (HCC)  #1 PEA/cardiac arrest/CAD/non-STEMI/chronic diastolic heart failure/probable cardiogenic shock resolved -Patient with ROSC after 3 rounds of CPR. -Patient required ICU care with intubation and vasopressor support and broad-spectrum antibiotics. -Patient subsequently extubated, weaned off pressors and transferred to [facility] service on 10/18/2023. -Troponins noted to be elevated > 24,000. -Patient noted to have received a heparin drip. -Patient seen in consultation by cardiology, underwent cardiac catheterization 10/20/2023 and triple-vessel disease noted but not amenable to PCI. -It was felt per cardiology that patient was not a candidate for bypass surgery and medical management recommended. -Continue aspirin, amlodipine, statin, Toprol-XL. -Cardiology was following but has signed off. -Outpatient follow-up with cardiology.  2.  Acute respiratory failure with hypoxia secondary to Influenza A  pneumonia/COPD exacerbation -Status post 5 days Tamiflu and steroids and IV Rocephin. -Improved clinically and weaned off oxygen currently with sats of 99 to 100% on room air. -Status post 7 days doxycycline with improvement with leukocytosis.   -Patient also received trial of IV Lasix.  3.  Mildly abnormal LFTs -Likely related to cardiac arrest.  4.  Pneumothorax -Status post pigtail catheter placement which has subsequently been removed. -Patient not hypoxic.  5.  Hypertension -Toprol-XL, amlodipine.    6.  Syncope -2D echo and cardiac catheterization as noted above. -Patient with no neurological deficits. -No significant abnormalities noted on telemetry. -Workup completed.  7.  Thrombocytopenia -Resolved.  8.  History of unspecified CVA/hyperlipidemia -No focal  neurological deficits. -Continue  aspirin and statin for secondary stroke prophylaxis.  9.  Macrocytic anemia/possible iron deficiency anemia -Status post IV iron. -Vitamin B12, folate normal. -Continue oral iron supplementation.  10.  Well-controlled diabetes mellitus type 2 with hyperglycemia -Hemoglobin A1c 5.0. -CBG 180 this morning. -Continue Semglee 10 units daily.   -Increase NovoLog meal coverage to 6 units 3 times daily with meals.   11.  NAFLD/cirrhosis -Compensated. -Outpatient follow-up with GI.  12.  History of breast cancer -Outpatient follow-up with oncology.  13.  Moderate protein calorie malnutrition -Nutritional supplementation.   14.  Debility -PT/OT recommending SNF placement.  15.  Goals of care -Initially pursuing short-term rehab, however insurance authorization with peer-to-peer was denied secondary to patient being able to ambulate 75 feet with CGA.  Patient was to be discharged home with home health however discussion with the patient's daughter stating that the patient is unable to care for herself at home safely and is requesting an expedited appeal for SNF.   The daughter is very concerned about the wellbeing of her mother and likely unsafe to be home without extra aid.  Patient lives with her husband who is also frail and unable to help.  She is adamant about pursuing short-term rehab.  Working closely with case management on available options and would hold off on discharging today given the concern for unsafe disposition.   Dr Sharlene Dory spoke with dauhgter, still awaiting decision from insurance appeal.  Dr Sharlene Dory discussed possibility of going home if the appeal is denied versus paying out-of-pocket for skilled care or aide at home.   DVT prophylaxis: Heparin Code Status: Full Family Communication: Updated patient and daughter at bedside..  Disposition: Awaiting appeal process from denial to SNF  Status is: Inpatient Remains inpatient appropriate because: Disposition    Consultants:  PCCM Cardiology: Dr.Thukkani 10/14/2023  Procedures: CT head 10/15/2023 2D echo 10/14/2023 Cardiac catheterization 10/20/2023 Chest tube placement, chest tube removal November 10, 2023  Significant Hospital Events: Including procedures, antibiotic start and stop dates in addition to other pertinent events   3/18 admit to [facility] Flu PNA. Syncope. Coded. Tube after. Admit ICU  2023-11-10 passed SBT and extubated.  Antimicrobials:  Anti-infectives (From admission, onward)    Start     Dose/Rate Route Frequency Ordered Stop   10/27/23 0000  doxycycline (VIBRA-TABS) 100 MG tablet        100 mg Oral Every 12 hours 10/27/23 0845 10/31/23 2359   10/23/23 1515  doxycycline (VIBRA-TABS) tablet 100 mg        100 mg Oral Every 12 hours 10/23/23 1425 10/30/23 0959   11/10/23 1000  oseltamivir (TAMIFLU) capsule 30 mg        30 mg Oral 2 times daily 11-10-2023 0728 10/18/23 2109   10/15/23 1000  oseltamivir (TAMIFLU) capsule 30 mg  Status:  Discontinued        30 mg Per Tube 2 times daily 10/15/23 0735 11-10-2023 0729   10/14/23 1200  metroNIDAZOLE (FLAGYL) IVPB 500 mg  Status:  Discontinued        500 mg 100 mL/hr over 60 Minutes Intravenous Every 12 hours 10/13/23 2357 10/14/23 0935   10/14/23 1030  azithromycin (ZITHROMAX) 500 mg in sodium chloride 0.9 % 250 mL IVPB        500 mg 250 mL/hr over 60 Minutes Intravenous Every 24 hours 10/14/23 0935 10/16/23 1038   10/14/23 1000  oseltamivir (TAMIFLU) capsule 30 mg  Status:  Discontinued  30 mg Per Tube Daily 10/14/23 0935 10/15/23 0735   10/14/23 0000  cefTRIAXone (ROCEPHIN) 2 g in sodium chloride 0.9 % 100 mL IVPB        2 g 200 mL/hr over 30 Minutes Intravenous Every 24 hours 10/13/23 2357 10/17/23 2339   10/13/23 2200  oseltamivir (TAMIFLU) capsule 30 mg  Status:  Discontinued        30 mg Oral 2 times daily 10/13/23 1247 10/13/23 1847   10/13/23 2015  oseltamivir (TAMIFLU) capsule 30 mg  Status:  Discontinued        30 mg Per Tube Daily  10/13/23 1847 10/14/23 0935   10/13/23 1145  oseltamivir (TAMIFLU) capsule 75 mg        75 mg Oral  Once 10/13/23 1133 10/13/23 1141         Subjective: Patient up in hallway working with physical therapy.  Denies any short of breath.  Some chest soreness.  Tolerating oral intake.  Daughter and friends at bedside.    Objective: Vitals:   11/05/23 0419 11/05/23 0720 11/05/23 0800 11/05/23 1132  BP: (!) 129/55 (!) 104/47 (!) 109/47 (!) 101/36  Pulse: 84 79 86 88  Resp: 18 19  20   Temp: 98.8 F (37.1 C) 98.2 F (36.8 C) 97.9 F (36.6 C) 98.2 F (36.8 C)  TempSrc: Oral Oral Oral Oral  SpO2: 100% 100% 100%   Weight: 59.7 kg     Height:        Intake/Output Summary (Last 24 hours) at 11/05/2023 1328 Last data filed at 11/05/2023 1230 Gross per 24 hour  Intake 160 ml  Output 401 ml  Net -241 ml   Filed Weights   11/03/23 0622 11/04/23 0416 11/05/23 0419  Weight: 59.9 kg 60.1 kg 59.7 kg    Examination:  General exam: NAD. Respiratory system: CTAB.  No wheezes, no crackles, no rhonchi.  Fair air movement.  Speaking in full sentences.  Cardiovascular system: Regular rate rhythm no murmurs rubs or gallops.  No JVD.  No lower extremity edema.   Gastrointestinal system: Abdomen is soft, nontender, nondistended, positive bowel sounds.  No rebound.  No guarding.  Central nervous system: Alert and oriented. No focal neurological deficits. Extremities: Symmetric 5 x 5 power. Skin: No rashes, lesions or ulcers Psychiatry: Judgement and insight appear normal. Mood & affect appropriate.     Data Reviewed: I have personally reviewed following labs and imaging studies  CBC: Recent Labs  Lab 10/31/23 0255  WBC 5.5  HGB 8.3*  HCT 26.5*  MCV 106.4*  PLT 245    Basic Metabolic Panel: Recent Labs  Lab 10/31/23 0255  NA 139  K 3.7  CL 104  CO2 26  GLUCOSE 192*  BUN 11  CREATININE 0.90  CALCIUM 9.0  MG 1.8    GFR: Estimated Creatinine Clearance: 41.4 mL/min (by C-G  formula based on SCr of 0.9 mg/dL).  Liver Function Tests: No results for input(s): "AST", "ALT", "ALKPHOS", "BILITOT", "PROT", "ALBUMIN" in the last 168 hours.  CBG: Recent Labs  Lab 11/04/23 1115 11/04/23 1550 11/04/23 2104 11/05/23 0547 11/05/23 1130  GLUCAP 310* 190* 243* 180* 320*     No results found for this or any previous visit (from the past 240 hours).       Radiology Studies: No results found.      Scheduled Meds:  amLODipine  5 mg Oral Daily   aspirin EC  81 mg Oral Daily   atorvastatin  80 mg Oral  Daily   clopidogrel  75 mg Oral Daily   feeding supplement  1 Container Oral TID BM   heparin  5,000 Units Subcutaneous Q8H   insulin aspart  0-15 Units Subcutaneous TID WC   insulin aspart  0-5 Units Subcutaneous QHS   insulin aspart  4 Units Subcutaneous TID WC   insulin glargine-yfgn  10 Units Subcutaneous Daily   methocarbamol  500 mg Oral Once   metoprolol succinate  25 mg Oral Daily   mometasone-formoterol  2 puff Inhalation BID   multivitamin with minerals  1 tablet Oral Daily   saccharomyces boulardii  250 mg Oral BID   sodium chloride flush  3 mL Intravenous Q12H   Continuous Infusions:   LOS: 23 days    Time spent: 35 minutes    Ramiro Harvest, MD [facility]   To contact the attending provider between 7A-7P or the covering provider during after hours 7P-7A, please log into the web site [email or web address] and access using universal [place] password for that web site. If you do not have the password, please call the hospital operator.  11/05/2023, 1:28 PM

## 2023-11-05 NOTE — Plan of Care (Signed)
  Problem: Fluid Volume: Goal: Ability to maintain a balanced intake and output will improve Outcome: Progressing   Problem: Metabolic: Goal: Ability to maintain appropriate glucose levels will improve Outcome: Progressing   

## 2023-11-05 NOTE — Progress Notes (Signed)
 Physical Therapy Treatment Patient Details Name: Anita Mccormick MRN: [id number] DOB: Oct 24, 1945 Today's Date: 11/05/2023   History of Present Illness Pt is 78 yo presenting to [facility] ED on 3/18 due to worsening SOB, orthopnea and wheezing with residual cough since COVID. In ED, pt with syncopal event and later PEA bradycardic/asystolic arrest with ROSC after 3 rounds of ACLS. 3/18-3/23 ICU stay. 3/18-3/22 intubation. 3/25 cardiac catheterization. PMH: HTN, HLD, CVA, DM, CKD II, HFEF, anemia, breast cancer, NAFLD/cirrhosis and COVID 08/2023    PT Comments  Pt greeted seated in recliner chair, pleasant and agreeable to PT session. She requested to use the bathroom and reported urgency, transferred to [facility] without AD and CGA for safety. Pt continues to demonstrate reduced activity tolerance and deconditioning. She ambulated ~63ft using RW with two standing rest breaks with cues for PLB. Pt reported 9/10 on the modified RPE scale following gait. Patient will benefit from continued inpatient follow up therapy, <3 hours/day.    If plan is discharge home, recommend the following: A little help with walking and/or transfers;Assist for transportation;Help with stairs or ramp for entrance;Assistance with cooking/housework;A little help with bathing/dressing/bathroom   Can travel by private vehicle     Yes  Equipment Recommendations  Rolling walker (2 wheels);BSC/3in1;Wheelchair (measurements PT);Wheelchair cushion (measurements PT)    Recommendations for Other Services       Precautions / Restrictions Precautions Precautions: Fall Recall of Precautions/Restrictions: Intact Restrictions Weight Bearing Restrictions Per Provider Order: No     Mobility  Bed Mobility               General bed mobility comments: Not assessed. Pt greeted in recliner chair and returned there at end of session.    Transfers Overall transfer level: Needs assistance Equipment used: None, Rolling walker (2  wheels) Transfers: Bed to chair/wheelchair/BSC, Sit to/from Stand Sit to Stand: Contact guard assist   Step pivot transfers: Contact guard assist       General transfer comment: Pt transferred from chair to [facility] on R without AD d/t urgency to complete BM. She required minA for safety. Pt stood using RW demonstrating proper hand placement. She required CGA to power up and increased time to achieve upright posture. Good eccentric control with sitting. Pt is able to scoot fwd/bkwd with BUE support on armrest.    Ambulation/Gait Ambulation/Gait assistance: Contact guard assist Gait Distance (Feet): 80 Feet Assistive device: Rolling walker (2 wheels) Gait Pattern/deviations: Step-through pattern, Decreased step length - right, Decreased step length - left, Decreased stride length, Trunk flexed, Wide base of support Gait velocity: reduced Gait velocity interpretation: <1.31 ft/sec, indicative of household ambulator   General Gait Details: Pt ambulated with short, small steps. She demonstrated adequate push off, heel strike, and foot clearence. She maintained fwd flex trunk over RW, VC/TC to correct posture, with mild correction noted. As pt fatigued, her flex posture increased as she has preference to rest with forearms on RW grips. Educated pt to maintain upright posture and use PLB techniques to aid in recovery. Pt maintained body inside RW at all times. She had difficulty navigating RW in hallway and would have to make a corrective lateral movement to ensure she was center in the hall and not going to hit/catch any obstacles.   Stairs             Wheelchair Mobility     Tilt Bed    Modified Rankin (Stroke Patients Only)       Balance Overall balance assessment:  Needs assistance Sitting-balance support: Feet supported, No upper extremity supported Sitting balance-Leahy Scale: Fair     Standing balance support: Bilateral upper extremity supported, During functional activity,  Reliant on assistive device for balance Standing balance-Leahy Scale: Poor Standing balance comment: Pt transferred without AD with CGA. She required BUE support during gait.                            Communication Communication Communication: No apparent difficulties  Cognition Arousal: Alert Behavior During Therapy: WFL for tasks assessed/performed   PT - Cognitive impairments: No apparent impairments                         Following commands: Intact      Cueing Cueing Techniques: Verbal cues  Exercises      General Comments General comments (skin integrity, edema, etc.): VSS on RA. Pt reported 9/10 on the modified RPE scale once seated in recliner chair.      Pertinent Vitals/Pain Pain Assessment Pain Assessment: No/denies pain    Home Living                          Prior Function            PT Goals (current goals can now be found in the care plan section) Acute Rehab PT Goals Patient Stated Goal: Go to rehab Progress towards PT goals: Progressing toward goals    Frequency    Min 2X/week      PT Plan      Co-evaluation              AM-PAC PT "6 Clicks" Mobility   Outcome Measure  Help needed turning from your back to your side while in a flat bed without using bedrails?: A Little Help needed moving from lying on your back to sitting on the side of a flat bed without using bedrails?: A Little Help needed moving to and from a bed to a chair (including a wheelchair)?: A Little Help needed standing up from a chair using your arms (e.g., wheelchair or bedside chair)?: A Little Help needed to walk in hospital room?: A Little Help needed climbing 3-5 steps with a railing? : Total 6 Click Score: 16    End of Session Equipment Utilized During Treatment: Gait belt Activity Tolerance: Patient limited by fatigue Patient left: in chair;with call bell/phone within reach;with family/visitor present Nurse Communication:  Mobility status PT Visit Diagnosis: Unsteadiness on feet (R26.81);Other abnormalities of gait and mobility (R26.89)     Time: 9604-5409 PT Time Calculation (min) (ACUTE ONLY): 24 min  Charges:    $Gait Training: 8-22 mins $Therapeutic Activity: 8-22 mins PT General Charges $$ ACUTE PT VISIT: 1 Visit                     Cheri Guppy, PT, DPT Acute Rehabilitation Services Office: [phone] Secure Chat Preferred  Richardson Chiquito 11/05/2023, 2:46 PM

## 2023-11-05 NOTE — Plan of Care (Signed)
  Problem: Education: Goal: Ability to describe self-care measures that may prevent or decrease complications (Diabetes Survival Skills Education) will improve Outcome: Progressing   Problem: Tissue Perfusion: Goal: Adequacy of tissue perfusion will improve Outcome: Progressing   Problem: Activity: Goal: Ability to tolerate increased activity will improve Outcome: Progressing

## 2023-11-06 DIAGNOSIS — J9601 Acute respiratory failure with hypoxia: Secondary | ICD-10-CM | POA: Diagnosis not present

## 2023-11-06 DIAGNOSIS — K7460 Unspecified cirrhosis of liver: Secondary | ICD-10-CM | POA: Diagnosis not present

## 2023-11-06 DIAGNOSIS — E440 Moderate protein-calorie malnutrition: Secondary | ICD-10-CM | POA: Diagnosis not present

## 2023-11-06 DIAGNOSIS — Z853 Personal history of malignant neoplasm of breast: Secondary | ICD-10-CM | POA: Diagnosis not present

## 2023-11-06 LAB — RENAL FUNCTION PANEL
Albumin: 2.6 g/dL — ABNORMAL LOW (ref 3.5–5.0)
Anion gap: 7 (ref 5–15)
BUN: 14 mg/dL (ref 8–23)
CO2: 26 mmol/L (ref 22–32)
Calcium: 8.9 mg/dL (ref 8.9–10.3)
Chloride: 104 mmol/L (ref 98–111)
Creatinine, Ser: 0.95 mg/dL (ref 0.44–1.00)
GFR, Estimated: >60 mL/min (ref >60)
Glucose, Bld: 251 mg/dL — ABNORMAL HIGH (ref 70–99)
Phosphorus: 2.7 mg/dL (ref 2.5–4.6)
Potassium: 4.5 mmol/L (ref 3.5–5.1)
Sodium: 137 mmol/L (ref 135–145)

## 2023-11-06 LAB — GLUCOSE, CAPILLARY
Glucose-Capillary: 183 mg/dL — ABNORMAL HIGH (ref 70–99)
Glucose-Capillary: 184 mg/dL — ABNORMAL HIGH (ref 70–99)
Glucose-Capillary: 229 mg/dL — ABNORMAL HIGH (ref 70–99)
Glucose-Capillary: 254 mg/dL — ABNORMAL HIGH (ref 70–99)
Glucose-Capillary: 218 mg/dL — ABNORMAL HIGH (ref 70–99)

## 2023-11-06 LAB — CBC
HCT: 23 % — ABNORMAL LOW (ref 36.0–46.0)
Hemoglobin: 7.1 g/dL — ABNORMAL LOW (ref 12.0–15.0)
MCH: 34.5 pg — ABNORMAL HIGH (ref 26.0–34.0)
MCHC: 30.9 g/dL (ref 30.0–36.0)
MCV: 111.7 fL — ABNORMAL HIGH (ref 80.0–100.0)
Platelets: 298 10*3/uL (ref 150–400)
RBC: 2.06 MIL/uL — ABNORMAL LOW (ref 3.87–5.11)
RDW: 17.2 % — ABNORMAL HIGH (ref 11.5–15.5)
WBC: 6.7 10*3/uL (ref 4.0–10.5)
nRBC: 0.6 % — ABNORMAL HIGH (ref 0.0–0.2)

## 2023-11-06 MED ORDER — MEDIHONEY WOUND/BURN DRESSING EX PSTE
1.0000 | PASTE | Freq: Every day | CUTANEOUS | Status: DC
  Administered 2023-11-06 – 2023-11-09 (×4): 1 via TOPICAL
  Filled 2023-11-06: qty 44

## 2023-11-06 MED ORDER — SODIUM CHLORIDE 0.9 % IV BOLUS
250.0000 mL | Freq: Once | INTRAVENOUS | Status: AC
  Administered 2023-11-06: 250 mL via INTRAVENOUS

## 2023-11-06 NOTE — Consult Note (Signed)
 WOC team consulted for buttocks wounds present since 4/7.  Secure chat sent to request photo documentation for consult.   Please note that the [facility] nursing team is utilizing a standardized work plan to manage patient consults. We are triaging consults and will try to see the patients within 48 hours. Wound photos in the patient's chart allow [place] to consult on the patient in the most efficient and timely manner.    Thank you,    Priscella Mann MSN, RN-BC, [employer] [phone]

## 2023-11-06 NOTE — Progress Notes (Signed)
 PROGRESS NOTE    Anita Mccormick  ZOX:[id number] DOB: 06/06/1946 DOA: 10/13/2023 PCP: Deeann Saint, MD    Chief Complaint  Patient presents with   Shortness of Breath   Cough   Cardiac Arrest    Brief Narrative:  78 year old female with history of tobacco abuse, hypertension, hyperlipidemia, unspecified CVA, diabetes mellitus type 2, CKD stage II, chronic diastolic heart failure, anemia, breast cancer, NAFLD/cirrhosis and COVID-19 infection in 08/2023 presented with worsening shortness of breath. On presentation, she required supplemental oxygen and was found to be influenza A positive; chest x-ray was negative for acute infiltrates. She was started on Tamiflu. Subsequently, had a syncopal event and was found to be more hypoxic. Later on, she was found to be in PEA bradycardic/asystolic arrest with ROSC after 3 rounds of ACLS. She was subsequently intubated and transferred to ICU. PCCM and cardiology were consulted. She was then found to have left pneumothorax for which pigtail catheter was placed. She also needed vasopressors for shock along with IV antibiotics. Troponins trended to more than 24,000. She was started on heparin drip. Echo showed EF of 60 to 65% with no valvular issues. She was also started on IV Solu-Medrol by PCCM. Subsequently, vasopressors were stopped. She was extubated on 10/17/2023.Chest tube was subsequently removed. She was transferred to [facility] service from 10/18/2023 onwards. Now doing great, ready for DC.  Awaiting SNF placement.     Assessment & Plan:   Principal Problem:   Acute respiratory failure with hypoxia (HCC) Active Problems:   Type II diabetes mellitus with renal manifestations (HCC)   Hyperlipidemia   Essential hypertension   History of CVA (cerebrovascular accident)   History of breast cancer   Fatty liver disease, nonalcoholic   Chronic kidney disease (CKD), stage II (mild)   Chronic diastolic CHF (congestive heart failure) (HCC)   Anemia    Cirrhosis of liver without ascites (HCC)   Influenza A   Cardiac arrest (HCC)   Pneumothorax on left   Malnutrition of moderate degree   Non-ST elevation (NSTEMI) myocardial infarction (HCC)  #1 PEA/cardiac arrest/CAD/non-STEMI/chronic diastolic heart failure/probable cardiogenic shock resolved -Patient with ROSC after 3 rounds of CPR. -Patient required ICU care with intubation and vasopressor support and broad-spectrum antibiotics. -Patient subsequently extubated, weaned off pressors and transferred to [facility] service on 10/18/2023. -Troponins noted to be elevated > 24,000. -Patient noted to have received a heparin drip. -Patient seen in consultation by cardiology, underwent cardiac catheterization 10/20/2023 and triple-vessel disease noted but not amenable to PCI. -It was felt per cardiology that patient was not a candidate for bypass surgery and medical management recommended. -Continue aspirin, statin. -BP soft and as such we will hold Toprol XL today.  Discontinue amlodipine. -Cardiology was following but has signed off. -Outpatient follow-up with cardiology.  2.  Acute respiratory failure with hypoxia secondary to Influenza A  pneumonia/COPD exacerbation -Status post 5 days Tamiflu and steroids and IV Rocephin. -Improved clinically and weaned off oxygen currently with sats of 99 to 100% on room air. -Status post 7 days doxycycline with improvement with leukocytosis.   -Patient also received trial of IV Lasix.  3.  Mildly abnormal LFTs -Likely related to cardiac arrest.  4.  Pneumothorax -Status post pigtail catheter placement which has subsequently been removed. -Patient not hypoxic.  5.  Hypertension -BP soft this morning.   -Discontinue amlodipine.   -Hold Toprol-XL today.   -Normal saline 250 cc bolus x 1.     6.  Syncope -2D echo  and cardiac catheterization as noted above. -Patient with no neurological deficits. -No significant abnormalities noted on telemetry. -Workup  completed.  7.  Thrombocytopenia -Resolved.  8.  History of unspecified CVA/hyperlipidemia -No focal neurological deficits. -Continue aspirin and statin for secondary stroke prophylaxis.  9.  Macrocytic anemia/possible iron deficiency anemia -Status post IV iron. -Vitamin B12, folate normal. -Continue oral iron supplementation.  10.  Well-controlled diabetes mellitus type 2 with hyperglycemia -Hemoglobin A1c 5.0. -CBG 184 this morning. -Continue Semglee 10 units daily.   -Continue NovoLog meal coverage to 6 units 3 times daily with meals.   11.  NAFLD/cirrhosis -Compensated. -Outpatient follow-up with GI.  12.  History of breast cancer -Outpatient follow-up with oncology.   13.  Moderate protein calorie malnutrition -Nutritional supplementation.   14.  Debility -PT/OT recommending SNF placement.  15.  Goals of care -Initially pursuing short-term rehab, however insurance authorization with peer-to-peer was denied secondary to patient being able to ambulate 75 feet with CGA.  Patient was to be discharged home with home health however discussion with the patient's daughter stating that the patient is unable to care for herself at home safely and is requesting an expedited appeal for SNF.   The daughter is very concerned about the wellbeing of her mother and likely unsafe to be home without extra aid.  Patient lives with her husband who is also frail and unable to help.  She is adamant about pursuing short-term rehab.  Working closely with case management on available options and would hold off on discharging today given the concern for unsafe disposition.   Dr Sharlene Dory spoke with dauhgter, still awaiting decision from insurance appeal.  Dr Sharlene Dory discussed possibility of going home if the appeal is denied versus paying out-of-pocket for skilled care or aide at home.   DVT prophylaxis: Heparin Code Status: Full Family Communication: Updated patient and daughter at bedside..   Disposition: Awaiting appeal process from denial to SNF  Status is: Inpatient Remains inpatient appropriate because: Disposition   Consultants:  PCCM Cardiology: Dr.Thukkani 10/14/2023  Procedures: CT head 10/15/2023 2D echo 10/14/2023 Cardiac catheterization 10/20/2023 Chest tube placement, chest tube removal 2023-10-18  Significant Hospital Events: Including procedures, antibiotic start and stop dates in addition to other pertinent events   3/18 admit to [facility] Flu PNA. Syncope. Coded. Tube after. Admit ICU  10/18/2023 passed SBT and extubated.  Antimicrobials:  Anti-infectives (From admission, onward)    Start     Dose/Rate Route Frequency Ordered Stop   10/27/23 0000  doxycycline (VIBRA-TABS) 100 MG tablet        100 mg Oral Every 12 hours 10/27/23 0845 10/31/23 2359   10/23/23 1515  doxycycline (VIBRA-TABS) tablet 100 mg        100 mg Oral Every 12 hours 10/23/23 1425 10/30/23 0959   10-18-2023 1000  oseltamivir (TAMIFLU) capsule 30 mg        30 mg Oral 2 times daily 18-Oct-2023 0728 10/18/23 2109   10/15/23 1000  oseltamivir (TAMIFLU) capsule 30 mg  Status:  Discontinued        30 mg Per Tube 2 times daily 10/15/23 0735 October 18, 2023 0729   10/14/23 1200  metroNIDAZOLE (FLAGYL) IVPB 500 mg  Status:  Discontinued        500 mg 100 mL/hr over 60 Minutes Intravenous Every 12 hours 10/13/23 2357 10/14/23 0935   10/14/23 1030  azithromycin (ZITHROMAX) 500 mg in sodium chloride 0.9 % 250 mL IVPB        500 mg  250 mL/hr over 60 Minutes Intravenous Every 24 hours 10/14/23 0935 10/16/23 1038   10/14/23 1000  oseltamivir (TAMIFLU) capsule 30 mg  Status:  Discontinued        30 mg Per Tube Daily 10/14/23 0935 10/15/23 0735   10/14/23 0000  cefTRIAXone (ROCEPHIN) 2 g in sodium chloride 0.9 % 100 mL IVPB        2 g 200 mL/hr over 30 Minutes Intravenous Every 24 hours 10/13/23 2357 10/17/23 2339   10/13/23 2200  oseltamivir (TAMIFLU) capsule 30 mg  Status:  Discontinued        30 mg Oral 2 times daily  10/13/23 1247 10/13/23 1847   10/13/23 2015  oseltamivir (TAMIFLU) capsule 30 mg  Status:  Discontinued        30 mg Per Tube Daily 10/13/23 1847 10/14/23 0935   10/13/23 1145  oseltamivir (TAMIFLU) capsule 75 mg        75 mg Oral  Once 10/13/23 1133 10/13/23 1141         Subjective: Patient sitting in recliner.  Denies any chest pain or shortness of breath.  No abdominal pain.  Complaining of sacral/buttocks pain.   Objective: Vitals:   11/06/23 0431 11/06/23 0808 11/06/23 1126 11/06/23 1624  BP: (!) 123/49 (!) 119/46 115/61 (!) 125/59  Pulse: 85 98 87 88  Resp: 17 20 19 18   Temp: 98.5 F (36.9 C) 98.4 F (36.9 C) 98.4 F (36.9 C) 98.6 F (37 C)  TempSrc: Oral Oral Oral Oral  SpO2: 100% 99% 100% 100%  Weight: 63.1 kg     Height:        Intake/Output Summary (Last 24 hours) at 11/06/2023 1700 Last data filed at 11/06/2023 0910 Gross per 24 hour  Intake 776 ml  Output 150 ml  Net 626 ml   Filed Weights   11/04/23 0416 11/05/23 0419 11/06/23 0431  Weight: 60.1 kg 59.7 kg 63.1 kg    Examination:  General exam: NAD. Respiratory system: Lungs clear to auscultation bilaterally.  No wheezes, no crackles, no rhonchi.  Fair air movement.  Speaking in full sentences.  Cardiovascular system: RRR no murmurs rubs or gallops.  No JVD.  No pitting lower extremity edema.    Gastrointestinal system: Abdomen is soft, nontender, nondistended, positive bowel sounds.  No rebound.  No guarding. Central nervous system: Alert and oriented. No focal neurological deficits. Extremities: Symmetric 5 x 5 power. Skin: No rashes, lesions or ulcers Psychiatry: Judgement and insight appear normal. Mood & affect appropriate.     Data Reviewed: I have personally reviewed following labs and imaging studies  CBC: Recent Labs  Lab 10/31/23 0255 11/06/23 1240  WBC 5.5 6.7  HGB 8.3* 7.1*  HCT 26.5* 23.0*  MCV 106.4* 111.7*  PLT 245 298    Basic Metabolic Panel: Recent Labs  Lab  10/31/23 0255 11/06/23 1108  NA 139 137  K 3.7 4.5  CL 104 104  CO2 26 26  GLUCOSE 192* 251*  BUN 11 14  CREATININE 0.90 0.95  CALCIUM 9.0 8.9  MG 1.8  --   PHOS  --  2.7    GFR: Estimated Creatinine Clearance: 43.3 mL/min (by C-G formula based on SCr of 0.95 mg/dL).  Liver Function Tests: Recent Labs  Lab 11/06/23 1108  ALBUMIN 2.6*    CBG: Recent Labs  Lab 11/05/23 2135 11/06/23 0607 11/06/23 0622 11/06/23 1129 11/06/23 1627  GLUCAP 190* 183* 184* 229* 254*     No results found for  this or any previous visit (from the past 240 hours).       Radiology Studies: No results found.      Scheduled Meds:  aspirin EC  81 mg Oral Daily   atorvastatin  80 mg Oral Daily   clopidogrel  75 mg Oral Daily   feeding supplement  1 Container Oral TID BM   heparin  5,000 Units Subcutaneous Q8H   insulin aspart  0-15 Units Subcutaneous TID WC   insulin aspart  0-5 Units Subcutaneous QHS   insulin aspart  6 Units Subcutaneous TID WC   insulin glargine-yfgn  10 Units Subcutaneous Daily   leptospermum manuka honey  1 Application Topical Daily   methocarbamol  500 mg Oral Once   metoprolol succinate  25 mg Oral Daily   mometasone-formoterol  2 puff Inhalation BID   multivitamin with minerals  1 tablet Oral Daily   saccharomyces boulardii  250 mg Oral BID   sodium chloride flush  3 mL Intravenous Q12H   Continuous Infusions:   LOS: 24 days    Time spent: 35 minutes    Ramiro Harvest, MD [facility]   To contact the attending provider between 7A-7P or the covering provider during after hours 7P-7A, please log into the web site [email or web address] and access using universal [place] password for that web site. If you do not have the password, please call the hospital operator.  11/06/2023, 5:00 PM

## 2023-11-06 NOTE — TOC Progression Note (Addendum)
 Transition of Care [facility]) - Progression Note    Patient Details  Name: RAVENNE WAYMENT MRN: [id number] Date of Birth: [birth year]  [facility] CM/SW Contact  Michaela Corner, [place] Phone Number: 11/06/2023, 11:31 AM  Clinical Narrative:   CSW called insurance regarding appeal status. Per representative the appeal has gone up to [employer] and the decision date is note until April 22nd. An acknowledgement letter has been sent to patients dtr, Sheronda's address on April 8th. This letter will have case number and contact information for [employer]. CSW notified treatment team and family.   12:28 PM CSW spoke with [facility] leadership, Steward Drone, regarding patients case and next steps. Per Steward Drone, patient cannot remain in the hospital for 2 week appeal decision. She advised CSW to speak with family about taking patient home. CSW and RNCM spoke with patients dtr about disposition and appeal. Lynnell Catalan was upset that she had not heard back about the expedited appeal first before the appeal went to [employer]. Sheronda plans to call insurance to discuss the miscommunication, then [facility] will follow up about HH.   TOC will continue to follow.    Expected Discharge Plan: Skilled Nursing Facility Barriers to Discharge: Other (must enter comment) (Appeal with [employer] is pending)  Expected Discharge Plan and Services   Discharge Planning Services: CM Consult Post Acute Care Choice: Skilled Nursing Facility Living arrangements for the past 2 months: Single Family Home                                       Social Determinants of Health (SDOH) Interventions SDOH Screenings   Food Insecurity: No Food Insecurity (10/13/2023)  Housing: Low Risk  (10/13/2023)  Transportation Needs: No Transportation Needs (10/13/2023)  Utilities: Not At Risk (10/13/2023)  Alcohol Screen: Low Risk  (10/03/2022)  Depression (PHQ2-9): Low Risk  (07/06/2023)  Financial Resource Strain: Low Risk  (10/03/2022)  Physical  Activity: Inactive (10/03/2022)  Social Connections: Moderately Isolated (10/13/2023)  Stress: No Stress Concern Present (10/03/2022)  Tobacco Use: High Risk (10/13/2023)    Readmission Risk Interventions    10/19/2023   10:43 AM  Readmission Risk Prevention Plan  Post Dischage Appt Complete  Medication Screening Complete  Transportation Screening Complete

## 2023-11-06 NOTE — Plan of Care (Signed)
  Problem: Coping: Goal: Ability to adjust to condition or change in health will improve Outcome: Progressing   Problem: Metabolic: Goal: Ability to maintain appropriate glucose levels will improve Outcome: Progressing   Problem: Clinical Measurements: Goal: Ability to maintain clinical measurements within normal limits will improve Outcome: Progressing   Problem: Pain Managment: Goal: General experience of comfort will improve and/or be controlled Outcome: Progressing

## 2023-11-06 NOTE — Consult Note (Signed)
 WOC Nurse Consult Note: Reason for Consult: wound charted as stage 2  Wound type: Unstageable PI to sacrum/upper buttocks  Pressure Injury POA: no, charted as Stage 2 4/7 that has progressed with necrotic tissue now present  Measurement: see nursing flowsheet  Wound bed: 75% yellow necrotic 25% pink  Drainage (amount, consistency, odor) see nursing flowsheet  Periwound: intact  Dressing procedure/placement/frequency: Cleanse sacral/buttocks wounds with NS, apply Medihoney to wound beds daily, cover with dry gauze and silicone foam.   POC discussed with bedside nurse. WOC team will follow every 7 to 10 days to assess  Wound and change POC as necessary.   Thank you,    Priscella Mann MSN, RN-BC, [employer] [phone]

## 2023-11-06 NOTE — Progress Notes (Signed)
 Mobility Specialist Progress Note:    11/06/23 1207  Mobility  Activity Ambulated with assistance in hallway  Level of Assistance Contact guard assist, steadying assist  Assistive Device Front wheel walker  Distance Ambulated (ft) 120 ft  Activity Response Tolerated well  Mobility Referral Yes  Mobility visit 1 Mobility  Mobility Specialist Start Time (ACUTE ONLY) 0940  Mobility Specialist Stop Time (ACUTE ONLY) 0955  Mobility Specialist Time Calculation (min) (ACUTE ONLY) 15 min   Received pt in chair having no complaints and agreeable to mobility. Pt was asymptomatic throughout ambulation and returned to room w/o fault. Left in chair w/ call bell in reach and all needs met.   Thompson Grayer Mobility Specialist  Please contact vis Secure Chat or  Rehab Office [phone]

## 2023-11-06 NOTE — Progress Notes (Signed)
 Occupational Therapy Treatment Patient Details Name: Anita Mccormick MRN: [id number] DOB: 12/18/1945 Today's Date: 11/06/2023   History of present illness Pt is 78 yo presenting to [facility] ED on 3/18 due to worsening SOB, orthopnea and wheezing with residual cough since COVID. In ED, pt with syncopal event and later PEA bradycardic/asystolic arrest with ROSC after 3 rounds of ACLS. 3/18-3/23 ICU stay. 3/18-3/22 intubation. 3/25 cardiac catheterization. PMH: HTN, HLD, CVA, DM, CKD II, HFEF, anemia, breast cancer, NAFLD/cirrhosis and COVID 08/2023   OT comments  Patient had recently ambulated in the hallway with mobility specialist, therefore OT providing education with regard to strategies for home if further follow up therapy is unable to be approved. Patient stating she is concerned with recall as her medications have changed this hospital stay, but uses a pillbox at baseline, and has assist from children and close friends to double check behind her. OT also providing information with regard to bed rail that can be placed between mattress and box spring as patient stating this is an area she continues to have difficulty in managing independently. OT recommendation remains apporpriate, OT will continue to follow acutely.       If plan is discharge home, recommend the following:  A little help with walking and/or transfers;A lot of help with bathing/dressing/bathroom;Assistance with cooking/housework;Assistance with feeding;Assist for transportation;Help with stairs or ramp for entrance   Equipment Recommendations  None recommended by OT    Recommendations for Other Services      Precautions / Restrictions Precautions Precautions: Fall Recall of Precautions/Restrictions: Intact Restrictions Weight Bearing Restrictions Per Provider Order: No       Mobility Bed Mobility               General bed mobility comments: Not assessed. Pt greeted in recliner chair    Transfers Overall transfer  level: Needs assistance                 General transfer comment: Patient politely declining as she had just worked with mobility     Balance Overall balance assessment: Needs assistance                                         ADL either performed or assessed with clinical judgement   ADL Overall ADL's : Needs assistance/impaired                                       General ADL Comments: Patient had recently ambulated in the hallway with mobility specialist, therefore OT providing education with regard to strategies for home if further follow up therapy is unable to be approved. Patient stating she is concerned with recall as her medications have changed this hospital stay, but uses a pillbox at baseline, and has assist from children and close friends to double check behind her. OT also providing information with regard to bed rail that can be placed between mattress and box spring as patient stating this is an area she continues to have difficulty in managing independently. OT recommendation remains apporpriate, OT will continue to follow acutely.    Extremity/Trunk Assessment              Diplomatic Services operational officer Communication Communication:  No apparent difficulties   Cognition Arousal: Alert Behavior During Therapy: WFL for tasks assessed/performed Cognition: No apparent impairments             OT - Cognition Comments: No apparent cognitive deficits this visit                 Following commands: Intact        Cueing   Cueing Techniques: Verbal cues  Exercises      Shoulder Instructions       General Comments VSS on RA    Pertinent Vitals/ Pain       Pain Assessment Pain Assessment: No/denies pain  Home Living                                          Prior Functioning/Environment              Frequency  Min 2X/week        Progress Toward  Goals  OT Goals(current goals can now be found in the care plan section)  Progress towards OT goals: Progressing toward goals  Acute Rehab OT Goals Patient Stated Goal: to get better OT Goal Formulation: With patient Time For Goal Achievement: 11/15/23 Potential to Achieve Goals: Good  Plan      Co-evaluation                 AM-PAC OT "6 Clicks" Daily Activity     Outcome Measure   Help from another person eating meals?: None Help from another person taking care of personal grooming?: A Little Help from another person toileting, which includes using toliet, bedpan, or urinal?: A Lot Help from another person bathing (including washing, rinsing, drying)?: A Lot Help from another person to put on and taking off regular upper body clothing?: A Little Help from another person to put on and taking off regular lower body clothing?: A Lot 6 Click Score: 16    End of Session    OT Visit Diagnosis: Unsteadiness on feet (R26.81);Other abnormalities of gait and mobility (R26.89);Muscle weakness (generalized) (M62.81);Other symptoms and signs involving cognitive function   Activity Tolerance Patient tolerated treatment well   Patient Left in chair;with call bell/phone within reach   Nurse Communication Mobility status        Time: 1001-1016 OT Time Calculation (min): 15 min  Charges: OT General Charges $OT Visit: 1 Visit OT Treatments $Self Care/Home Management : 8-22 mins  Pollyann Glen E. Kianna Billet, OTR/L Acute Rehabilitation Services [phone]   Anita Mccormick 11/06/2023, 10:38 AM

## 2023-11-07 ENCOUNTER — Other Ambulatory Visit: Payer: Self-pay

## 2023-11-07 DIAGNOSIS — K760 Fatty (change of) liver, not elsewhere classified: Secondary | ICD-10-CM

## 2023-11-07 DIAGNOSIS — D649 Anemia, unspecified: Secondary | ICD-10-CM | POA: Diagnosis not present

## 2023-11-07 DIAGNOSIS — K7460 Unspecified cirrhosis of liver: Secondary | ICD-10-CM | POA: Diagnosis not present

## 2023-11-07 DIAGNOSIS — N182 Chronic kidney disease, stage 2 (mild): Secondary | ICD-10-CM | POA: Diagnosis not present

## 2023-11-07 DIAGNOSIS — J9601 Acute respiratory failure with hypoxia: Secondary | ICD-10-CM | POA: Diagnosis not present

## 2023-11-07 DIAGNOSIS — D509 Iron deficiency anemia, unspecified: Secondary | ICD-10-CM

## 2023-11-07 DIAGNOSIS — Z860101 Personal history of adenomatous and serrated colon polyps: Secondary | ICD-10-CM

## 2023-11-07 LAB — GLUCOSE, CAPILLARY
Glucose-Capillary: 195 mg/dL — ABNORMAL HIGH (ref 70–99)
Glucose-Capillary: 304 mg/dL — ABNORMAL HIGH (ref 70–99)
Glucose-Capillary: 98 mg/dL (ref 70–99)
Glucose-Capillary: 218 mg/dL — ABNORMAL HIGH (ref 70–99)

## 2023-11-07 LAB — FOLATE: Folate: 16.8 ng/mL (ref >5.9)

## 2023-11-07 LAB — IRON AND TIBC
Iron: 56 ug/dL (ref 28–170)
Saturation Ratios: 16 % (ref 10.4–31.8)
TIBC: 353 ug/dL (ref 250–450)
UIBC: 297 ug/dL

## 2023-11-07 LAB — VITAMIN B12: Vitamin B-12: 1139 pg/mL — ABNORMAL HIGH (ref 180–914)

## 2023-11-07 LAB — PREPARE RBC (CROSSMATCH)

## 2023-11-07 LAB — FERRITIN: Ferritin: 215 ng/mL (ref 11–307)

## 2023-11-07 MED ORDER — ACETAMINOPHEN 325 MG PO TABS
650.0000 mg | ORAL_TABLET | Freq: Once | ORAL | Status: AC
  Administered 2023-11-07: 650 mg via ORAL
  Filled 2023-11-07: qty 2

## 2023-11-07 MED ORDER — INSULIN GLARGINE-YFGN 100 UNIT/ML ~~LOC~~ SOLN
14.0000 [IU] | Freq: Every day | SUBCUTANEOUS | Status: DC
  Administered 2023-11-08 – 2023-11-09 (×2): 14 [IU] via SUBCUTANEOUS
  Filled 2023-11-07 (×3): qty 0.14

## 2023-11-07 MED ORDER — SODIUM CHLORIDE 0.9% FLUSH
10.0000 mL | Freq: Two times a day (BID) | INTRAVENOUS | Status: DC
  Administered 2023-11-07 – 2023-11-11 (×8): 10 mL

## 2023-11-07 MED ORDER — FUROSEMIDE 10 MG/ML IJ SOLN
20.0000 mg | Freq: Once | INTRAMUSCULAR | Status: AC
  Administered 2023-11-07: 20 mg via INTRAVENOUS
  Filled 2023-11-07: qty 2

## 2023-11-07 MED ORDER — INSULIN ASPART 100 UNIT/ML IJ SOLN
8.0000 [IU] | Freq: Three times a day (TID) | INTRAMUSCULAR | Status: DC
  Administered 2023-11-07 – 2023-11-11 (×10): 8 [IU] via SUBCUTANEOUS

## 2023-11-07 MED ORDER — CHLORHEXIDINE GLUCONATE CLOTH 2 % EX PADS
6.0000 | MEDICATED_PAD | Freq: Every day | CUTANEOUS | Status: DC
  Administered 2023-11-07 – 2023-11-11 (×5): 6 via TOPICAL

## 2023-11-07 MED ORDER — PANTOPRAZOLE SODIUM 40 MG IV SOLR
40.0000 mg | Freq: Two times a day (BID) | INTRAVENOUS | Status: DC
  Administered 2023-11-07 – 2023-11-11 (×9): 40 mg via INTRAVENOUS
  Filled 2023-11-07 (×8): qty 10

## 2023-11-07 MED ORDER — SODIUM CHLORIDE 0.9% FLUSH: 10.0000 mL | INTRAVENOUS | Status: DC | PRN

## 2023-11-07 MED ORDER — SODIUM CHLORIDE 0.9% IV SOLUTION: Freq: Once | INTRAVENOUS | Status: AC

## 2023-11-07 NOTE — Progress Notes (Signed)
 PROGRESS NOTE    Anita Mccormick  ZOX:[id number] DOB: 1946/04/27 DOA: 10/13/2023 PCP: Deeann Saint, MD    Chief Complaint  Patient presents with   Shortness of Breath   Cough   Cardiac Arrest    Brief Narrative:  78 year old female with history of tobacco abuse, hypertension, hyperlipidemia, unspecified CVA, diabetes mellitus type 2, CKD stage II, chronic diastolic heart failure, anemia, breast cancer, NAFLD/cirrhosis and COVID-19 infection in 08/2023 presented with worsening shortness of breath. On presentation, she required supplemental oxygen and was found to be influenza A positive; chest x-ray was negative for acute infiltrates. She was started on Tamiflu. Subsequently, had a syncopal event and was found to be more hypoxic. Later on, she was found to be in PEA bradycardic/asystolic arrest with ROSC after 3 rounds of ACLS. She was subsequently intubated and transferred to ICU. PCCM and cardiology were consulted. She was then found to have left pneumothorax for which pigtail catheter was placed. She also needed vasopressors for shock along with IV antibiotics. Troponins trended to more than 24,000. She was started on heparin drip. Echo showed EF of 60 to 65% with no valvular issues. She was also started on IV Solu-Medrol by PCCM. Subsequently, vasopressors were stopped. She was extubated on 10/17/2023.Chest tube was subsequently removed. She was transferred to [facility] service from 10/18/2023 onwards. Now doing great, ready for DC.  Awaiting SNF placement.     Assessment & Plan:   Principal Problem:   Acute respiratory failure with hypoxia (HCC) Active Problems:   Type II diabetes mellitus with renal manifestations (HCC)   Hyperlipidemia   Essential hypertension   History of CVA (cerebrovascular accident)   History of breast cancer   Fatty liver disease, nonalcoholic   Chronic kidney disease (CKD), stage II (mild)   Chronic diastolic CHF (congestive heart failure) (HCC)   Anemia    Cirrhosis of liver without ascites (HCC)   Influenza A   Cardiac arrest (HCC)   Pneumothorax on left   Malnutrition of moderate degree   Non-ST elevation (NSTEMI) myocardial infarction (HCC)  #1 PEA/cardiac arrest/CAD/non-STEMI/chronic diastolic heart failure/probable cardiogenic shock resolved -Patient with ROSC after 3 rounds of CPR. -Patient required ICU care with intubation and vasopressor support and broad-spectrum antibiotics. -Patient subsequently extubated, weaned off pressors and transferred to [facility] service on 10/18/2023. -Troponins noted to be elevated > 24,000. -Patient noted to have received a heparin drip. -Patient seen in consultation by cardiology, underwent cardiac catheterization 10/20/2023 and triple-vessel disease noted but not amenable to PCI. -It was felt per cardiology that patient was not a candidate for bypass surgery and medical management recommended. -Continue aspirin, statin. -BP soft and as such Norvasc discontinued.   -Continue Toprol-XL.  -Cardiology was following but has signed off. -Outpatient follow-up with cardiology.  2.  Acute respiratory failure with hypoxia secondary to Influenza A  pneumonia/COPD exacerbation -Status post 5 days Tamiflu and steroids and IV Rocephin. -Improved clinically and weaned off oxygen currently with sats of 99 to 100% on room air. -Status post 7 days doxycycline with improvement with leukocytosis.   -Patient also received trial of IV Lasix.  3.  Mildly abnormal LFTs -Likely related to cardiac arrest.  4.  Pneumothorax -Status post pigtail catheter placement which has subsequently been removed. -Patient not hypoxic.  5.  Hypertension -BP soft this morning.   -Discontinued amlodipine.   -Continue Toprol-XL.   -Patient to be transfused 2 units PRBCs today.   6.  Syncope -2D echo and cardiac catheterization as  noted above. -Patient with no neurological deficits. -No significant abnormalities noted on  telemetry. -Workup completed.  7.  Thrombocytopenia -Platelet count fluctuating currently stable at 76. -Would likely need outpatient follow-up with hematology.  8.  History of unspecified CVA/hyperlipidemia -No focal neurological deficits. -Continue aspirin and statin for secondary stroke prophylaxis.  9.  Macrocytic anemia/possible iron deficiency anemia -Status post IV iron. -Vitamin B12, folate normal. -Continue oral iron supplementation. -Patient does endorse some melanotic stools. -Hemoglobin down to 7.1 today from 8.3 on 10/31/2023 from 11.2 on admission. -Check FOBT. -Check an anemia panel. -Transfuse 2 units PRBCs in light of cardiac issues. -Hold Plavix. -Place on a full liquid diet. -Place on PPI IV every 12 hours. -Consult with GI for further evaluation and management.  10.  Well-controlled diabetes mellitus type 2 with hyperglycemia -Hemoglobin A1c 5.0. -CBG 195 this morning. -Increase Semglee to 14 units daily.   -Increase meal coverage NovoLog 8 units 3 times daily with meals.   -SSI.  11.  NAFLD/cirrhosis -Compensated. -Outpatient follow-up with GI.  12.  History of breast cancer -Outpatient follow-up with oncology.   13.  Moderate protein calorie malnutrition -Nutritional supplementation.   14.  Debility -PT/OT recommending SNF placement.  15. Pressure injury Pressure Injury 11/02/23 Buttocks Right;Proximal Stage 2 -  Partial thickness loss of dermis presenting as a shallow open injury with a red, pink wound bed without slough. (Active)  11/02/23 0720  Location: Buttocks  Location Orientation: Right;Proximal  Staging: Stage 2 -  Partial thickness loss of dermis presenting as a shallow open injury with a red, pink wound bed without slough.  Wound Description (Comments):   Present on Admission: No     16.  Goals of care -Initially pursuing short-term rehab, however insurance authorization with peer-to-peer was denied secondary to patient being able  to ambulate 75 feet with CGA.  Patient was to be discharged home with home health however discussion with the patient's daughter stating that the patient is unable to care for herself at home safely and is requesting an expedited appeal for SNF.   The daughter is very concerned about the wellbeing of her mother and likely unsafe to be home without extra aid.  Patient lives with her husband who is also frail and unable to help.  She is adamant about pursuing short-term rehab.  Working closely with case management on available options and would hold off on discharging today given the concern for unsafe disposition.   Dr Macarthur Savory spoke with dauhgter, still awaiting decision from insurance appeal.  Dr Macarthur Savory discussed possibility of going home if the appeal is denied versus paying out-of-pocket for skilled care or aide at home.   DVT prophylaxis: Heparin Code Status: Full Family Communication: Updated patient and husband at bedside.  Disposition: Awaiting appeal process from denial to SNF  Status is: Inpatient Remains inpatient appropriate because: Disposition   Consultants:  PCCM Cardiology: Dr.Thukkani 10/14/2023  Procedures: CT head 10/15/2023 2D echo 10/14/2023 Cardiac catheterization 10/20/2023 Chest tube placement, chest tube removal Nov 15, 2023 Transfused 2 units PRBCs pending 11/07/2023  Significant Hospital Events: Including procedures, antibiotic start and stop dates in addition to other pertinent events   3/18 admit to [facility] Flu PNA. Syncope. Coded. Tube after. Admit ICU  11/15/23 passed SBT and extubated.  Antimicrobials:  Anti-infectives (From admission, onward)    Start     Dose/Rate Route Frequency Ordered Stop   10/27/23 0000  doxycycline (VIBRA-TABS) 100 MG tablet        100 mg Oral  Every 12 hours 10/27/23 0845 10/31/23 2359   10/23/23 1515  doxycycline (VIBRA-TABS) tablet 100 mg        100 mg Oral Every 12 hours 10/23/23 1425 10/30/23 0959   10/17/23 1000  oseltamivir (TAMIFLU)  capsule 30 mg        30 mg Oral 2 times daily 10/17/23 0728 10/18/23 2109   10/15/23 1000  oseltamivir (TAMIFLU) capsule 30 mg  Status:  Discontinued        30 mg Per Tube 2 times daily 10/15/23 0735 10/17/23 0729   10/14/23 1200  metroNIDAZOLE (FLAGYL) IVPB 500 mg  Status:  Discontinued        500 mg 100 mL/hr over 60 Minutes Intravenous Every 12 hours 10/13/23 2357 10/14/23 0935   10/14/23 1030  azithromycin (ZITHROMAX) 500 mg in sodium chloride 0.9 % 250 mL IVPB        500 mg 250 mL/hr over 60 Minutes Intravenous Every 24 hours 10/14/23 0935 10/16/23 1038   10/14/23 1000  oseltamivir (TAMIFLU) capsule 30 mg  Status:  Discontinued        30 mg Per Tube Daily 10/14/23 0935 10/15/23 0735   10/14/23 0000  cefTRIAXone (ROCEPHIN) 2 g in sodium chloride 0.9 % 100 mL IVPB        2 g 200 mL/hr over 30 Minutes Intravenous Every 24 hours 10/13/23 2357 10/17/23 2339   10/13/23 2200  oseltamivir (TAMIFLU) capsule 30 mg  Status:  Discontinued        30 mg Oral 2 times daily 10/13/23 1247 10/13/23 1847   10/13/23 2015  oseltamivir (TAMIFLU) capsule 30 mg  Status:  Discontinued        30 mg Per Tube Daily 10/13/23 1847 10/14/23 0935   10/13/23 1145  oseltamivir (TAMIFLU) capsule 75 mg        75 mg Oral  Once 10/13/23 1133 10/13/23 1141         Subjective: Patient sitting up in recliner.  Some complaints of chest soreness.  No significant shortness of breath.  Some intermittent cough.  Denies any abdominal pain.  Does endorse some melanotic stools.   Objective: Vitals:   11/07/23 0025 11/07/23 0425 11/07/23 0757 11/07/23 0837  BP: (!) 126/45 (!) 130/45 (!) 123/44   Pulse: 95 92 94   Resp: 18 17 20    Temp: 98.8 F (37.1 C) 98.6 F (37 C) 98.1 F (36.7 C)   TempSrc: Oral Oral Oral   SpO2: 100% 100% 100% 98%  Weight:  60.6 kg    Height:        Intake/Output Summary (Last 24 hours) at 11/07/2023 1449 Last data filed at 11/06/2023 2148 Gross per 24 hour  Intake 390 ml  Output --  Net 390  ml   Filed Weights   11/05/23 0419 11/06/23 0431 11/07/23 0425  Weight: 59.7 kg 63.1 kg 60.6 kg    Examination:  General exam: NAD. Respiratory system: Lungs clear to auscultation bilaterally.  No wheezes, no crackles, no rhonchi.  Fair air movement.  Speaking in full sentences.  Cardiovascular system: RRR no murmurs rubs or gallops.  No JVD.  No pitting lower extremity edema.    Gastrointestinal system: Abdomen is soft, nontender, nondistended, positive bowel sounds.  No rebound.  No guarding. Central nervous system: Alert and oriented. No focal neurological deficits. Extremities: Symmetric 5 x 5 power. Skin: No rashes, lesions or ulcers Psychiatry: Judgement and insight appear normal. Mood & affect appropriate.     Data Reviewed:  I have personally reviewed following labs and imaging studies  CBC: Recent Labs  Lab 11/06/23 1240  WBC 6.7  HGB 7.1*  HCT 23.0*  MCV 111.7*  PLT 298    Basic Metabolic Panel: Recent Labs  Lab 11/06/23 1108  NA 137  K 4.5  CL 104  CO2 26  GLUCOSE 251*  BUN 14  CREATININE 0.95  CALCIUM 8.9  PHOS 2.7    GFR: Estimated Creatinine Clearance: 42.5 mL/min (by C-G formula based on SCr of 0.95 mg/dL).  Liver Function Tests: Recent Labs  Lab 11/06/23 1108  ALBUMIN 2.6*    CBG: Recent Labs  Lab 11/06/23 1129 11/06/23 1627 11/06/23 2044 11/07/23 0548 11/07/23 1156  GLUCAP 229* 254* 218* 195* 304*     No results found for this or any previous visit (from the past 240 hours).       Radiology Studies: No results found.      Scheduled Meds:  aspirin EC  81 mg Oral Daily   atorvastatin  80 mg Oral Daily   feeding supplement  1 Container Oral TID BM   heparin  5,000 Units Subcutaneous Q8H   insulin aspart  0-15 Units Subcutaneous TID WC   insulin aspart  0-5 Units Subcutaneous QHS   insulin aspart  6 Units Subcutaneous TID WC   insulin glargine-yfgn  10 Units Subcutaneous Daily   leptospermum manuka honey  1  Application Topical Daily   methocarbamol  500 mg Oral Once   metoprolol succinate  25 mg Oral Daily   mometasone-formoterol  2 puff Inhalation BID   multivitamin with minerals  1 tablet Oral Daily   pantoprazole (PROTONIX) IV  40 mg Intravenous Q12H   saccharomyces boulardii  250 mg Oral BID   sodium chloride flush  3 mL Intravenous Q12H   Continuous Infusions:   LOS: 25 days    Time spent: 35 minutes    Hilda Lovings, MD [facility]   To contact the attending provider between 7A-7P or the covering provider during after hours 7P-7A, please log into the web site [email or web address] and access using universal [place] password for that web site. If you do not have the password, please call the hospital operator.  11/07/2023, 2:49 PM

## 2023-11-07 NOTE — Consult Note (Addendum)
 Consultation  Referring Provider:TRH/ Hildy Lowers Primary Care Physician:  Viola Greulich, MD Primary Gastroenterologist:  [facility] 2021  Reason for Consultation:  anemia.  Significant drop in hemoglobin since admission  HPI: Anita Mccormick is a 78 y.o. female with history of previous breast cancer, diabetes mellitus, prior CVA, hypertension, chronic kidney disease, congestive heart failure and nonalcoholic fatty liver disease/cirrhosis. She initially became ill in February 2025 with COVID-19 infection.  She was then admitted to the hospital on 10/13/2023 with shortness of breath and tested positive for influenza A started on Tamiflu.  Course was complicated by syncope, and hypoxia and then PEA bradycardic asystolic arrest requiring CPR x 3 on 10/14/2023.  Diagnosed with NSTEMI, required intubation, pressor support and ICU admission. Sustained left pneumothorax from CPR and chest tube. Extubated 322 Cardiac cath on 10/20/2023 which did not show any disease amenable to PCI and medical therapy was recommended with aspirin.  She has been doing well over the past week or so and was awaiting SNF placement/rehab.  Now states that her insurance will not cover.  She has been noted to have a significant drop in her hemoglobin since admission.  On admission hemoglobin was 12 3/21 hemoglobin 9.9/MCV 1 6/platelets 125 3/23 serum iron 21/TIBC 270/iron sat 8 3/28 hemoglobin 9/hematocrit 28 4/5 hemoglobin 8.3/hematocrit 26 B12 1139 4/11 hemoglobin 7.1/hematocrit 23.  Hemoccult is pending today  Reviewing chart patient was diagnosed with an iron deficiency anemia by her PCP in September 2024.  She was started on oral iron and then says she underwent a series of 8 iron infusions receiving last she believes in January.  Patient was on Plavix/ASA prior to admission, has received heparin here, Lovenox, and is on baby aspirin.  She has no lower GI complaints other than chronic issues with constipation,  she has not noted any melena or hematochezia.  She does endorse occasional mild dysphagia, no episodes requiring regurgitation.  Occasional heartburn or indigestion.  Does not believe she has had endoscopy previously Colonoscopy 2021 Dr. Andriette Keeling with 3 polyps removed largest 10 mm and noted to have large internal hemorrhoids.  Path from the polyp showed the rectal polyp.  Tubulovillous adenoma and descending colon polyp was a tubular adenoma.    Past Medical History:  Diagnosis Date   Arthritis    Cancer (HCC)    breast left   Cerebrovascular accident [facility])    October 29 2021- Left eye- blind   COLONIC POLYPS, HX OF 12/10/2006   Qualifier: Diagnosis of  By: Penney Bowling MD, Bruce     Diabetes mellitus type II, controlled (HCC) 12/10/2006   Poor control on Januvia 100mg  and glipizide 10mg  XL Lab Results  Component Value Date   HGBA1C 8.3* 11/02/2014        Eczema    Fatty liver disease, nonalcoholic 07/14/2014   Per Dr. Penney Bowling Lab Results  Component Value Date   ALT 31 11/02/2014   AST 57* 11/02/2014   ALKPHOS 104 11/02/2014   BILITOT 1.1 11/02/2014       GERD (gastroesophageal reflux disease)    History of blood transfusion    History of kidney stones 2023   11/28/21 small stone currently, taking flomax   Hypertension    Iron deficiency anemia, unspecified 05/08/2023   Sepsis (HCC) 09/19/2017   Sepsis due to Escherichia coli (E. coli) (HCC)    Symptomatic anemia 06/06/2021   UTI (urinary tract infection) 09/19/2017   Vaginal discharge 04/01/2022    Past Surgical History:  Procedure  Laterality Date   ARTERY BIOPSY Left 11/29/2021   Procedure: LEFT BIOPSY TEMPORAL ARTERY;  Surgeon: Dannis Dy, MD;  Location: [facility] OR;  Service: Vascular;  Laterality: Left;   CATARACT EXTRACTION Bilateral    LEFT HEART CATH AND CORONARY ANGIOGRAPHY N/A 10/20/2023   Procedure: LEFT HEART CATH AND CORONARY ANGIOGRAPHY;  Surgeon: Swaziland, Peter M, MD;  Location: [facility] INVASIVE CV LAB;  Service:  Cardiovascular;  Laterality: N/A;   MASTECTOMY Left    TUBAL LIGATION      Prior to Admission medications   Medication Sig Start Date End Date Taking? Authorizing Provider  aspirin EC 81 MG tablet Take 81 mg by mouth daily. Swallow whole.   Yes [provider]  atenolol (TENORMIN) 100 MG tablet TAKE 1 TABLET BY MOUTH TWICE A DAY 04/27/23  Yes Viola Greulich, MD  Continuous Glucose Sensor (DEXCOM G7 SENSOR) MISC Apply a new sensor to skin every 10 days. 04/23/23  Yes Viola Greulich, MD  Dulaglutide (TRULICITY) 1.5 MG/0.5ML SOAJ Inject 1.5 mg into the skin once a week.   Yes [provider]  ferrous gluconate (FERGON) 324 MG tablet Take 1 tablet (324 mg total) by mouth daily. 04/23/23  Yes Viola Greulich, MD  glipiZIDE (GLUCOTROL XL) 10 MG 24 hr tablet TAKE 1 TABLET BY MOUTH TWICE A DAY 09/28/23  Yes Viola Greulich, MD  Multiple Vitamin (MULTIVITAMIN) tablet Take 1 tablet by mouth daily.   Yes [provider]  naproxen sodium (ALEVE) 220 MG tablet Take 220 mg by mouth daily as needed.   Yes [provider]  acetaminophen (TYLENOL) 325 MG tablet Take 2 tablets (650 mg total) by mouth every 6 (six) hours as needed for mild pain (pain score 1-3) (or Fever >/= 101). 10/27/23   Krishnan, Gokul, MD  amLODipine (NORVASC) 5 MG tablet Take 1 tablet (5 mg total) by mouth daily. 10/27/23   Krishnan, Gokul, MD  atorvastatin (LIPITOR) 80 MG tablet Take 1 tablet (80 mg total) by mouth daily. 10/27/23   Krishnan, Gokul, MD  benzonatate (TESSALON) 100 MG capsule Take 1 capsule (100 mg total) by mouth 2 (two) times daily as needed for cough. Patient not taking: Reported on 10/13/2023 08/26/23   Viola Greulich, MD  blood glucose meter kit and supplies KIT Dispense based on patient and insurance preference. Use up to four times daily as directed. 06/05/21   Viola Greulich, MD  clopidogrel (PLAVIX) 75 MG tablet Take 1 tablet (75 mg total) by mouth daily. 10/27/23   Krishnan, Gokul, MD   guaiFENesin (ROBITUSSIN) 100 MG/5ML liquid Take 5 mLs by mouth every 4 (four) hours as needed for cough or to loosen phlegm. 10/27/23   Krishnan, Gokul, MD  ipratropium-albuterol (DUONEB) 0.5-2.5 (3) MG/3ML SOLN Take 3 mLs by nebulization every 4 (four) hours as needed. 10/27/23   Maylene Spear, MD  Lancets [facility] ULTRASOFT) lancets Use as instructed 07/07/22   Viola Greulich, MD  loperamide (IMODIUM) 2 MG capsule Take 2 capsules (4 mg total) by mouth 3 (three) times daily as needed for diarrhea or loose stools. 10/27/23   Krishnan, Gokul, MD  melatonin 3 MG TABS tablet Take 1 tablet (3 mg total) by mouth at bedtime as needed. 10/27/23   Krishnan, Gokul, MD  metoprolol succinate (TOPROL-XL) 25 MG 24 hr tablet Take 1 tablet (25 mg total) by mouth daily. 10/27/23   Krishnan, Gokul, MD  mometasone-formoterol [facility]) 200-5 MCG/ACT AERO Inhale 2 puffs into the lungs 2 (  two) times daily. 10/27/23   Krishnan, Gokul, MD  saccharomyces boulardii (FLORASTOR) 250 MG capsule Take 1 capsule (250 mg total) by mouth 2 (two) times daily. 10/27/23   Krishnan, Gokul, MD    Current Facility-Administered Medications  Medication Dose Route Frequency Provider Last Rate Last Admin   acetaminophen (TYLENOL) tablet 650 mg  650 mg Oral Q6H PRN Swaziland, Peter M, MD   650 mg at 11/03/23 6578   Or   acetaminophen (TYLENOL) suppository 650 mg  650 mg Rectal Q6H PRN [place], Peter M, MD       aspirin EC tablet 81 mg  81 mg Oral Daily Jordan, Peter M, MD   81 mg at 11/07/23 1255   atorvastatin (LIPITOR) tablet 80 mg  80 mg Oral Daily Jordan, Peter M, MD   80 mg at 11/07/23 1050   feeding supplement (BOOST / RESOURCE BREEZE) liquid 1 Container  1 Container Oral TID BM Krishnan, Gokul, MD   1 Container at 10/26/23 1437   guaiFENesin (ROBITUSSIN) 100 MG/5ML liquid 5 mL  5 mL Oral Q4H PRN Krishnan, Gokul, MD   5 mL at 11/01/23 2210   heparin injection 5,000 Units  5,000 Units Subcutaneous Q8H Swaziland, Peter M, MD   5,000 Units at 11/07/23  4696   hydrALAZINE (APRESOLINE) injection 10 mg  10 mg Intravenous Q4H PRN [place], Peter M, MD   10 mg at 10/17/23 0943   insulin aspart (novoLOG) injection 0-15 Units  0-15 Units Subcutaneous TID WC Swazil[place], Peter M, MD   11 Units at 11/07/23 1254   insulin aspart (novoLOG) injection 0-5 Units  0-5 Units Subcutaneous QHS [place]azil[place], Peter M, MD   2 Units at 11/06/23 2150   insulin aspart (novoLOG) injection 6 Units  6 Units Subcutaneous TID WC Armenta Landau, MD   6 Units at 11/07/23 1251   insulin glargine-yfgn (SEMGLEE) injection 10 Units  10 Units Subcutaneous Daily Armenta Landau, MD   10 Units at 11/07/23 1252   ipratropium-albuterol (DUONEB) 0.5-2.5 (3) MG/3ML nebulizer solution 3 mL  3 mL Nebulization Q4H PRN [place], Peter M, MD       leptospermum manuka honey (MEDIHONEY) paste 1 Application  1 Application Topical Daily Armenta Landau, MD   1 Application at 11/07/23 1252   loperamide (IMODIUM) capsule 4 mg  4 mg Oral TID PRN Krishnan, Gokul, MD       melatonin tablet 3 mg  3 mg Oral QHS PRN Krishnan, Gokul, MD   3 mg at 11/06/23 2238   methocarbamol (ROBAXIN) tablet 500 mg  500 mg Oral Once Krishnan, Gokul, MD       metoprolol succinate (TOPROL-XL) 24 hr tablet 25 mg  25 mg Oral Daily Cooper, Michael, MD   25 mg at 11/07/23 1253   mometasone-formoterol (DULERA) 200-5 MCG/ACT inhaler 2 puff  2 puff Inhalation BID Krishnan, Gokul, MD   2 puff at 11/07/23 0836   multivitamin with minerals tablet 1 tablet  1 tablet Oral Daily Jordan, Peter M, MD   1 tablet at 11/07/23 1049   Oral care mouth rinse  15 mL Mouth Rinse PRN [place]azil[place], Peter M, MD       Oral care mouth rinse  15 mL Mouth Rinse PRN [place], Peter M, MD       pantoprazole (PROTONIX) injection 40 mg  40 mg Intravenous Q12H Armenta Landau, MD   40 mg at 11/07/23 1035   saccharomyces boulardii (FLORASTOR) capsule 250 mg  250 mg Oral  BID Maylene Spear, MD   250 mg at 11/07/23 1048   sodium chloride flush (NS) 0.9 % injection 3 mL   3 mL Intravenous Q12H [place]azil[place], Peter M, MD   3 mL at 11/07/23 1041   white petrolatum (VASELINE) gel   Topical PRN [place]azil[place], Peter M, MD        Allergies as of 10/13/2023 - Review Complete 10/13/2023  Allergen Reaction Noted   Bee venom Swelling 11/28/2021   Penicillins Hives 10/08/2006   Ace inhibitors Cough 03/23/2013   Chlorthalidone  10/08/2006   Metformin  07/08/2006   Sulfamethoxazole  10/08/2006    Family History  Problem Relation Age of Onset   Stroke Mother    Cancer Father        prostate   Diabetes Sister    Pancreatic cancer Sister     Social History   Socioeconomic History   Marital status: Married    Spouse name: Not on file   Number of children: Not on file   Years of education: Not on file   Highest education level: Not on file  Occupational History   Not on file  Tobacco Use   Smoking status: Some Days    Current packs/day: 0.20    Average packs/day: 0.2 packs/day for 10.0 years (2.0 ttl pk-yrs)    Types: Cigarettes   Smokeless tobacco: Never  Vaping Use   Vaping status: Never Used  Substance and Sexual Activity   Alcohol use: Not Currently   Drug use: No   Sexual activity: Not Currently  Other Topics Concern   Not on file  Social History Narrative   Married (husband [place] Breen). 4 children. 11 grandkids. 1 greatgrandchild.       Still working as homemaker      Hobbies: playing cards, board games, cooking, Print production planner   Social Drivers of Health   Financial Resource Strain: Low Risk  (10/03/2022)   Overall Financial Resource Strain (CARDIA)    Difficulty of Paying Living Expenses: Not hard at all  Food Insecurity: No Food Insecurity (10/13/2023)   Hunger Vital Sign    Worried About Running Out of Food in the Last Year: Never true    Ran Out of Food in the Last Year: Never true  Transportation Needs: No Transportation Needs (10/13/2023)   PRAPARE - Administrator, Civil Service (Medical): No    Lack of Transportation (Non-Medical): No   Physical Activity: Inactive (10/03/2022)   Exercise Vital Sign    Days of Exercise per Week: 0 days    Minutes of Exercise per Session: 0 min  Stress: No Stress Concern Present (10/03/2022)   [employer] of Occupational Health - Occupational Stress Questionnaire    Feeling of Stress : Not at all  Social Connections: Moderately Isolated (10/13/2023)   Social Connection and Isolation Panel [NHANES]    Frequency of Communication with Friends and Family: Three times a week    Frequency of Social Gatherings with Friends and Family: Three times a week    Attends Religious Services: Never    Active Member of Clubs or Organizations: No    Attends Banker Meetings: Never    Marital Status: Married  Catering manager Violence: Not At Risk (10/13/2023)   Humiliation, Afraid, Rape, and Kick questionnaire    Fear of Current or Ex-Partner: No    Emotionally Abused: No    Physically Abused: No    Sexually Abused: No    Review of Systems: Pertinent positive and  negative review of systems were noted in the above HPI section.  All other review of systems was otherwise negative.   Physical Exam: Vital signs in last 24 hours: Temp:  [98.1 F (36.7 C)-98.8 F (37.1 C)] 98.1 F (36.7 C) (04/12 0757) Pulse Rate:  [88-95] 94 (04/12 0757) Resp:  [17-20] 20 (04/12 0757) BP: (123-133)/(44-65) 123/44 (04/12 0757) SpO2:  [98 %-100 %] 98 % (04/12 0837) Weight:  [60.6 kg] 60.6 kg (04/12 0425) Last BM Date : 11/06/23 General:   Alert,  Well-developed, well-nourished,elderly AAfemale, pleasant and cooperative in NAD, sitting up in chair Head:  Normocephalic and atraumatic. Eyes:  Sclera clear, no icterus.   Conjunctiva pale Ears:  Normal auditory acuity. Nose:  No deformity, discharge,  or lesions. Mouth:  No deformity or lesions.   Neck:  Supple; no masses or thyromegaly. Lungs:  Clear throughout to auscultation.   No wheezes, crackles, or rhonchi.  Heart:  Regular rate and rhythm; no  murmurs, clicks, rubs,  or gallops. Abdomen:  Soft,nontender, BS active,nonpalp mass or hsm. Low midline scar Rectal:  not done Msk:  Symmetrical without gross deformities. . Pulses:  Normal pulses noted. Extremities:  Without clubbing or edema. Neurologic:  Alert and  oriented x4;  grossly normal neurologically. Skin:  Intact without significant lesions or rashes.. Psych:  Alert and cooperative. Normal mood and affect.  Intake/Output from previous day: 04/11 0701 - 04/12 0700 In: 629 [P.O.:626; I.V.:3] Out: -  Intake/Output this shift: No intake/output data recorded.  Lab Results: Recent Labs    11/06/23 1240  WBC 6.7  HGB 7.1*  HCT 23.0*  PLT 298   BMET Recent Labs    11/06/23 1108  NA 137  K 4.5  CL 104  CO2 26  GLUCOSE 251*  BUN 14  CREATININE 0.95  CALCIUM 8.9   LFT Recent Labs    11/06/23 1108  ALBUMIN 2.6*   PT/INR No results for input(s): "LABPROT", "INR" in the last 72 hours. Hepatitis Panel No results for input(s): "HEPBSAG", "HCVAB", "HEPAIGM", "HEPBIGM" in the last 72 hours.  IMPRESSION:  Number one 78 year old African-American female diagnosis of iron deficiency anemia made in fall 2024 treated with oral iron and a new series of iron infusions.  Hemoglobin improved. She has now had a drop in hemoglobin of 5 g over the past 3 weeks with prolonged complicated hospitalization. Hemoglobin 7 today and to receive 1 unit of packed RBCs  Etiology for the drop in hemoglobin may be multifactoral with prolonged hospital course numerous labs procedures etc. and anticoagulation. No overt GI bleeding, Hemoccult pending  #2 recurrent iron deficiency despite 5 iron infusions completed about 4 months ago  #3 status post PEA/bradycardic asystolic arrest 10/14/2023/NSTEMI. Cardiac cath 10/20/2023, no disease amenable to PCI/recommended medical therapy/aspirin #4 left pneumothorax secondary to CPR required chest tube #5 prior CVA #6.  Congestive heart  failure #7.  Diabetes mellitus #8.  History of nonalcoholic fatty liver disease/cirrhosis #9 history of breast cancer #10 history of tubulovillous adenoma and adenomatous polyp at colonoscopy 2021    PLAN: Discussed potential endoscopic evaluation with the patient today for further investigation of her persistent iron deficiency anemia.  She is agreeable to EGD and colonoscopy if felt indicated. She is at increased risk for complications with sedation given recent NSTEMI 3 weeks ago  .  Check Hemoccult   Consider proceeding just with EGD during this admission, however with her history of advanced adenoma last colonoscopy 2021 we may need to consider proceeding  with EGD and colonoscopy during this admission. Will discuss further,And could plan for early this coming week.  Would  proceed with further doses of IV iron during this hospitalization.   Amy EsterwoodPA-C  11/07/2023, 2:38 PM  I have taken an interval history, thoroughly reviewed the chart and examined the patient. I agree with the Advanced Practitioner's note, impression and recommendations, and have recorded additional findings, impressions and recommendations below. I performed a substantive portion of this encounter (>50% time spent), including a complete performance of the medical decision making.  My additional thoughts are as follows:  Recovering from complex illness, worsening of prehospital IDA that has not had a workup. Agree with EGD and colonoscopy, inpatient setting seems best if feasible given her current condition. Timing TBD (perhaps Monday or Tuesday), pending endoscopy availability. Will check back tomorrow.  Kerby Pearson III [phone]

## 2023-11-07 NOTE — Progress Notes (Signed)
 Mobility Specialist Progress Note:    11/07/23 1147  Mobility  Activity Ambulated with assistance in room;Ambulated with assistance in hallway  Level of Assistance Minimal assist, patient does 75% or more  Assistive Device Front wheel walker  Distance Ambulated (ft) 100 ft  Activity Response Tolerated well  Mobility Referral Yes  Mobility visit 1 Mobility  Mobility Specialist Start Time (ACUTE ONLY) 1137  Mobility Specialist Stop Time (ACUTE ONLY) 1150  Mobility Specialist Time Calculation (min) (ACUTE ONLY) 13 min   Pt received in chair, husband in room. Agreeable to mobility session. Ambulated in hallway with RW and MinG. MinA required to stand from chair. Tolerated well, Max HR 123 bpm during session. Pt limited by fatigue. Returned pt to room, transferred [employer]. Peri care performed. Pt left sitting up in chair with all needs met, call bell in reach.   Nikkita Adeyemi Mobility Specialist Please contact via Special educational needs teacher or  Rehab office at [phone]

## 2023-11-07 NOTE — Progress Notes (Signed)
 Pt did not sign consent until after she spoke with MD about blood transfusion order was writtent this am but pt did not speak to MD unitl later in the afternoon , she did tell MD she would accept transfusion however she was resistant until it ws explained to her daughter.  IV flushed this am with meds then when getting ready for transfusion IV flushed and it was leaking and painful consult placed for IV team, IV RN came immediately and MD was on unit to speak with pt daughter it was decided that a PICC line would be more appropriate for pt due to poor vascular access.  Cutrrently [facility] RN is still with pt awaiting starting transfusion umtil vascular access obtained

## 2023-11-07 NOTE — Progress Notes (Signed)
 Peripherally Inserted Central Catheter Placement  The IV Nurse has discussed with the patient and/or persons authorized to consent for the patient, the purpose of this procedure and the potential benefits and risks involved with this procedure.  The benefits include less needle sticks, lab draws from the catheter, and the patient may be discharged home with the catheter. Risks include, but not limited to, infection, bleeding, blood clot (thrombus formation), and puncture of an artery; nerve damage and irregular heartbeat and possibility to perform a PICC exchange if needed/ordered by physician.  Alternatives to this procedure were also discussed.  Bard Power PICC patient education guide, fact sheet on infection prevention and patient information card has been provided to patient /or left at bedside. Family at bedside all questions answered to their satisfaction.   PICC Placement Documentation  PICC Single Lumen 11/07/23 Right Brachial 38 cm 0 cm (Active)  Indication for Insertion or Continuance of Line Limited venous access - need for IV therapy >5 days (PICC only) 11/07/23 1758  Exposed Catheter (cm) 0 cm 11/07/23 1758  Site Assessment Clean, Dry, Intact 11/07/23 1758  Line Status Flushed;Saline locked;Blood return noted 11/07/23 1758  Dressing Type Securing device;Transparent;Gauze 11/07/23 1758  Dressing Status Antimicrobial disc/dressing in place;Clean 11/07/23 1758  Line Care Connections checked and tightened 11/07/23 1758  Line Adjustment (NICU/IV Team Only) No 11/07/23 1758  Dressing Intervention New dressing;Adhesive placed at insertion site (IV team only);Adhesive placed around edges of dressing (IV team/ICU RN only) 11/07/23 1758  Dressing Change Due 11/14/23 11/07/23 1758       Anita Mccormick 11/07/2023, 6:00 PM

## 2023-11-07 NOTE — Plan of Care (Signed)
  Problem: Health Behavior/Discharge Planning: Goal: Ability to manage health-related needs will improve Outcome: Progressing   Problem: Metabolic: Goal: Ability to maintain appropriate glucose levels will improve Outcome: Progressing   Problem: Skin Integrity: Goal: Risk for impaired skin integrity will decrease Outcome: Progressing

## 2023-11-08 DIAGNOSIS — J9601 Acute respiratory failure with hypoxia: Secondary | ICD-10-CM | POA: Diagnosis not present

## 2023-11-08 DIAGNOSIS — R195 Other fecal abnormalities: Secondary | ICD-10-CM

## 2023-11-08 DIAGNOSIS — N182 Chronic kidney disease, stage 2 (mild): Secondary | ICD-10-CM | POA: Diagnosis not present

## 2023-11-08 DIAGNOSIS — K7460 Unspecified cirrhosis of liver: Secondary | ICD-10-CM | POA: Diagnosis not present

## 2023-11-08 DIAGNOSIS — D649 Anemia, unspecified: Secondary | ICD-10-CM | POA: Diagnosis not present

## 2023-11-08 LAB — TYPE AND SCREEN
ABO/RH(D): B POS
Antibody Screen: NEGATIVE
Unit division: 0
Unit division: 0

## 2023-11-08 LAB — BASIC METABOLIC PANEL WITH GFR
Anion gap: 8 (ref 5–15)
BUN: 8 mg/dL (ref 8–23)
CO2: 25 mmol/L (ref 22–32)
Calcium: 8.6 mg/dL — ABNORMAL LOW (ref 8.9–10.3)
Chloride: 106 mmol/L (ref 98–111)
Creatinine, Ser: 0.83 mg/dL (ref 0.44–1.00)
GFR, Estimated: >60 mL/min (ref >60)
Glucose, Bld: 186 mg/dL — ABNORMAL HIGH (ref 70–99)
Potassium: 4.1 mmol/L (ref 3.5–5.1)
Sodium: 139 mmol/L (ref 135–145)

## 2023-11-08 LAB — CBC
HCT: 31.1 % — ABNORMAL LOW (ref 36.0–46.0)
Hemoglobin: 9.9 g/dL — ABNORMAL LOW (ref 12.0–15.0)
MCH: 31 pg (ref 26.0–34.0)
MCHC: 31.8 g/dL (ref 30.0–36.0)
MCV: 97.5 fL (ref 80.0–100.0)
Platelets: 241 10*3/uL (ref 150–400)
RBC: 3.19 MIL/uL — ABNORMAL LOW (ref 3.87–5.11)
RDW: 21.4 % — ABNORMAL HIGH (ref 11.5–15.5)
WBC: 5.7 10*3/uL (ref 4.0–10.5)
nRBC: 0.3 % — ABNORMAL HIGH (ref 0.0–0.2)

## 2023-11-08 LAB — GLUCOSE, CAPILLARY
Glucose-Capillary: 182 mg/dL — ABNORMAL HIGH (ref 70–99)
Glucose-Capillary: 233 mg/dL — ABNORMAL HIGH (ref 70–99)
Glucose-Capillary: 206 mg/dL — ABNORMAL HIGH (ref 70–99)
Glucose-Capillary: 77 mg/dL (ref 70–99)

## 2023-11-08 LAB — BPAM RBC
Blood Product Expiration Date: 202505092359
Blood Product Expiration Date: 202505102359
ISSUE DATE / TIME: 202504121842
ISSUE DATE / TIME: 202504122154
Unit Type and Rh: 7300
Unit Type and Rh: 7300

## 2023-11-08 LAB — OCCULT BLOOD X 1 CARD TO LAB, STOOL: Fecal Occult Bld: POSITIVE — AB

## 2023-11-08 LAB — HEMOGLOBIN AND HEMATOCRIT, BLOOD
HCT: 31.1 % — ABNORMAL LOW (ref 36.0–46.0)
Hemoglobin: 9.8 g/dL — ABNORMAL LOW (ref 12.0–15.0)

## 2023-11-08 LAB — MAGNESIUM: Magnesium: 2 mg/dL (ref 1.7–2.4)

## 2023-11-08 MED ORDER — POLYETHYLENE GLYCOL 3350 17 GM/SCOOP PO POWD
119.0000 g | Freq: Once | ORAL | Status: AC
  Administered 2023-11-09: 119 g via ORAL
  Filled 2023-11-08: qty 119

## 2023-11-08 MED ORDER — BISACODYL 5 MG PO TBEC
10.0000 mg | DELAYED_RELEASE_TABLET | Freq: Once | ORAL | Status: AC
  Administered 2023-11-09: 10 mg via ORAL
  Filled 2023-11-08: qty 2

## 2023-11-08 NOTE — Progress Notes (Addendum)
 Patient ID: Anita Mccormick, female   DOB: 06-06-1946, 78 y.o.   MRN: [id number]    Progress Note   Subjective  Day # 27 CC: anemia, Iron deficiency, heme + stool- significant drop in HGb since admit  Initially admitted 10/13/2023 influenza a positive, course complicated by syncope, hypoxia and then PEA bradycardic asystolic arrest requiring CPR x 3 on 10/14/2023.  Diagnosed with NSTEMI required intubation, pressor support and ICU admission. Left pneumothorax from CPR chest tube-removed-extubated 3/22 Cardiac cath 10/20/2023 did not show any disease amenable to PCI/medical therapy recommended with aspirin  Hemoccult yesterday positive Hemoglobin 9.9/hematocrit 31.9 posttransfusion yesterday  Patient has not had any evidence of overt bleeding, lying comfortably in bed this afternoon no particular complaints.  She is aware that her stool tested heme positive and wants to find out what the underlying problem is.    Objective   Vital signs in last 24 hours: Temp:  [97.5 F (36.4 C)-98.5 F (36.9 C)] 98.5 F (36.9 C) (04/13 1137) Pulse Rate:  [78-88] 79 (04/13 1137) Resp:  [13-20] 17 (04/13 1137) BP: (94-128)/(35-76) 120/46 (04/13 1137) SpO2:  [97 %-100 %] 100 % (04/13 1137) Weight:  [60.9 kg] 60.9 kg (04/13 0605) Last BM Date : 11/06/23 General: Elderly African-American female in NAD pleasant Heart:  Regular rate and rhythm; no murmurs Lungs: Respirations even and unlabored, lungs CTA bilaterally Abdomen:  Soft, nontender and nondistended. Normal bowel sounds. Extremities:  Without edema. Neurologic:  Alert and oriented,  grossly normal neurologically. Psych:  Cooperative. Normal mood and affect.  Intake/Output from previous day: 04/12 0701 - 04/13 0700 In: 934 [P.O.:150; Blood:784] Out: 350 [Urine:350] Intake/Output this shift: Total I/O In: 240 [P.O.:240] Out: 200 [Urine:200]  Lab Results: Recent Labs    11/06/23 1240 11/08/23 0522  WBC 6.7 5.7  HGB 7.1* 9.9*  HCT 23.0*  31.1*  PLT 298 241   BMET Recent Labs    11/06/23 1108 11/08/23 0522  NA 137 139  K 4.5 4.1  CL 104 106  CO2 26 25  GLUCOSE 251* 186*  BUN 14 8  CREATININE 0.95 0.83  CALCIUM 8.9 8.6*   LFT Recent Labs    11/06/23 1108  ALBUMIN 2.6*   PT/INR No results for input(s): "LABPROT", "INR" in the last 72 hours.  Studies/Results: [place] EKG SITE RITE Result Date: 11/07/2023 If Site Rite image not attached, placement could not be confirmed due to current cardiac rhythm.      Assessment / Plan:    #63 78 year old African-American female diagnosis of iron deficiency anemia made fall 2024 initially treated with oral iron and then a series of iron infusions with improvement. Hospitalist now over the past 3-1/2 weeks with 5 g drop in hemoglobin since admission in setting of a long complicated hospitalization. Hemoglobin down to 7 yesterday transfused 1 unit of packed RBCs and up to 9.9 today  Stool documented Hemoccult positive  Etiology of her iron deficiency anemia is not clear, will need to sort out upper versus lower GI source Rule out chronic gastropathy, AVMs, ulcer disease, occult neoplasm Patient does have history of an advanced adenoma/villous adenoma on colonoscopy 2021/Dr. Medoff  #2 status post PEA/bradycardic asystolic arrest 10/14/2023/NSTEMI Cardiac cath 10/20/2023 no disease amenable to PCI recommended aspirin therapy  #3 left pneumothorax post CPR #4 congestive heart failure #5.  Prior CVA #6.  Diabetes mellitus #7.  Nonalcoholic fatty liver disease with underlying cirrhosis compensated #8.  History of breast cancer  Plan will allow solid food today  Clear liquid diet tomorrow Continue to trend hemoglobin and transfuse if indicated We will plan for EGD and colonoscopy with Dr. Rosaline Coma on Tuesday, 11/10/2023.  Discussed further with patient today including indications risk and benefits and she is agreeable to proceed.  Give IV iron if that has not been done  GI  will continue to follow with you   Principal Problem:   Acute respiratory failure with hypoxia (HCC) Active Problems:   Type II diabetes mellitus with renal manifestations (HCC)   Hyperlipidemia   Essential hypertension   History of CVA (cerebrovascular accident)   History of breast cancer   Fatty liver disease, nonalcoholic   Chronic kidney disease (CKD), stage II (mild)   Chronic diastolic CHF (congestive heart failure) (HCC)   Anemia   Cirrhosis of liver without ascites (HCC)   Influenza A   Cardiac arrest (HCC)   Pneumothorax on left   Malnutrition of moderate degree   Non-ST elevation (NSTEMI) myocardial infarction (HCC)     LOS: 26 days   Amy Esterwood PA-C 11/08/2023, 12:10 PM  I have taken an interval history, thoroughly reviewed the chart and examined the patient. I agree with the Advanced Practitioner's note, impression and recommendations, and have recorded additional findings, impressions and recommendations below. I performed a substantive portion of this encounter (>50% time spent), including a complete performance of the medical decision making.  My additional thoughts are as follows:  Clinically unchanged as we saw yesterday.  Hemoglobin up to 9.8 after transfusion. Current plan is for EGD and colonoscopy day after tomorrow.  Final plans per the GI service managed by Dr. Rosaline Coma tomorrow.   Kerby Pearson III Office:[phone]

## 2023-11-08 NOTE — Progress Notes (Signed)
 PROGRESS NOTE    Anita Mccormick  GMW:[id number] DOB: 1946/05/21 DOA: 10/13/2023 PCP: Viola Greulich, MD    Chief Complaint  Patient presents with   Shortness of Breath   Cough   Cardiac Arrest    Brief Narrative:  78 year old female with history of tobacco abuse, hypertension, hyperlipidemia, unspecified CVA, diabetes mellitus type 2, CKD stage II, chronic diastolic heart failure, anemia, breast cancer, NAFLD/cirrhosis and COVID-19 infection in 08/2023 presented with worsening shortness of breath. On presentation, she required supplemental oxygen and was found to be influenza A positive; chest x-ray was negative for acute infiltrates. She was started on Tamiflu. Subsequently, had a syncopal event and was found to be more hypoxic. Later on, she was found to be in PEA bradycardic/asystolic arrest with ROSC after 3 rounds of ACLS. She was subsequently intubated and transferred to ICU. PCCM and cardiology were consulted. She was then found to have left pneumothorax for which pigtail catheter was placed. She also needed vasopressors for shock along with IV antibiotics. Troponins trended to more than 24,000. She was started on heparin drip. Echo showed EF of 60 to 65% with no valvular issues. She was also started on IV Solu-Medrol by PCCM. Subsequently, vasopressors were stopped. She was extubated on 10/17/2023.Chest tube was subsequently removed. She was transferred to [facility] service from 10/18/2023 onwards. Now doing great, ready for DC.  Patient noted to become anemic, FOBT obtained positive.  GI consulted.  Awaiting SNF placement.     Assessment & Plan:   Principal Problem:   Acute respiratory failure with hypoxia (HCC) Active Problems:   Type II diabetes mellitus with renal manifestations (HCC)   Hyperlipidemia   Essential hypertension   History of CVA (cerebrovascular accident)   History of breast cancer   Fatty liver disease, nonalcoholic   Chronic kidney disease (CKD), stage II (mild)    Chronic diastolic CHF (congestive heart failure) (HCC)   Anemia   Cirrhosis of liver without ascites (HCC)   Influenza A   Cardiac arrest (HCC)   Pneumothorax on left   Malnutrition of moderate degree   Non-ST elevation (NSTEMI) myocardial infarction (HCC)  #1 PEA/cardiac arrest/CAD/non-STEMI/chronic diastolic heart failure/probable cardiogenic shock resolved -Patient with ROSC after 3 rounds of CPR. -Patient required ICU care with intubation and vasopressor support and broad-spectrum antibiotics. -Patient subsequently extubated, weaned off pressors and transferred to [facility] service on 10/18/2023. -Troponins noted to be elevated > 24,000. -Patient noted to have received a heparin drip. -Patient seen in consultation by cardiology, underwent cardiac catheterization 10/20/2023 and triple-vessel disease noted but not amenable to PCI. -It was felt per cardiology that patient was not a candidate for bypass surgery and medical management recommended. -Continue aspirin, statin. -BP soft and as such Norvasc discontinued.   -Continue Toprol-XL.  -Cardiology was following but has signed off. -Outpatient follow-up with cardiology.  2.  Acute respiratory failure with hypoxia secondary to Influenza A  pneumonia/COPD exacerbation -Status post 5 days Tamiflu and steroids and IV Rocephin. -Improved clinically and weaned off oxygen currently with sats of 99 to 100% on room air. -Status post 7 days doxycycline with improvement with leukocytosis.   -Patient also received trial of IV Lasix.  3.  Mildly abnormal LFTs -Likely related to cardiac arrest.  4.  Pneumothorax -Status post pigtail catheter placement which has subsequently been removed. -Patient not hypoxic.  5.  Hypertension -BP soft this morning.   -Continue to hold amlodipine.   -Toprol-XL.   -Repeat BP manually. -Status post transfusion  2 units PRBCs.    6.  Syncope -2D echo and cardiac catheterization as noted above. -Patient with no  neurological deficits. -No significant abnormalities noted on telemetry. -Workup completed.  7.  Thrombocytopenia -Platelet count fluctuating and has improved currently at 241 this morning.  -Would likely need outpatient follow-up with hematology.  8.  History of unspecified CVA/hyperlipidemia -No focal neurological deficits. -Continue aspirin and statin for secondary stroke prophylaxis.   9.  Macrocytic anemia/possible iron deficiency anemia -Status post IV iron. -Vitamin B12, folate normal. -Continue oral iron supplementation. -Patient does endorse some melanotic stools. -Hemoglobin currently at 9.9( posttransfusion 2 units PRBCs) from down to 7.1 from 8.3 on 10/31/2023 from 11.2 on admission. -FOBT positive. -Continue to hold Plavix. -Continue full liquid diet. -Continue IV PPI every 12 hours. -Patient seen in consultation by GI who are recommending EGD and colonoscopy inpatient. -Appreciate GI input and recommendations.  10.  Well-controlled diabetes mellitus type 2 with hyperglycemia -Hemoglobin A1c 5.0. -CBG 182 this morning. -Continue Semglee to 14 units daily.   -Continue meal coverage NovoLog 8 units 3 times daily with meals.   -SSI.  11.  NAFLD/cirrhosis -Compensated. -Outpatient follow-up with GI.  12.  History of breast cancer -Outpatient follow-up with oncology.   13.  Moderate protein calorie malnutrition -Nutritional supplementation.   14.  Debility -PT/OT recommending SNF placement.  15. Pressure injury Pressure Injury 11/02/23 Buttocks Right;Proximal Stage 2 -  Partial thickness loss of dermis presenting as a shallow open injury with a red, pink wound bed without slough. (Active)  11/02/23 0720  Location: Buttocks  Location Orientation: Right;Proximal  Staging: Stage 2 -  Partial thickness loss of dermis presenting as a shallow open injury with a red, pink wound bed without slough.  Wound Description (Comments):   Present on Admission: No     16.   Goals of care -Initially pursuing short-term rehab, however insurance authorization with peer-to-peer was denied secondary to patient being able to ambulate 75 feet with CGA.  Patient was to be discharged home with home health however discussion with the patient's daughter stating that the patient is unable to care for herself at home safely and is requesting an expedited appeal for SNF.   The daughter is very concerned about the wellbeing of her mother and likely unsafe to be home without extra aid.  Patient lives with her husband who is also frail and unable to help.  She is adamant about pursuing short-term rehab.  Working closely with case management on available options and would hold off on discharging today given the concern for unsafe disposition.   Dr Macarthur Savory spoke with dauhgter, still awaiting decision from insurance appeal.  Dr Macarthur Savory discussed possibility of going home if the appeal is denied versus paying out-of-pocket for skilled care or aide at home.   DVT prophylaxis: Heparin Code Status: Full Family Communication: Updated patient.  No family at bedside. Disposition: Awaiting appeal process from denial to SNF  Status is: Inpatient Remains inpatient appropriate because: Disposition   Consultants:  PCCM Cardiology: Dr.Thukkani 10/14/2023 Gastroenterology: Dr. Dominic Friendly III 11/07/2023  Procedures: CT head 10/15/2023 2D echo 10/14/2023 Cardiac catheterization 10/20/2023 Chest tube placement, chest tube removal 10-26-2023 Transfused 2 units PRBCs pending 11/07/2023 PICC line placement 11/07/2023 2 units PRBCs 11/07/2023  Significant Hospital Events: Including procedures, antibiotic start and stop dates in addition to other pertinent events   3/18 admit to [facility] Flu PNA. Syncope. Coded. Tube after. Admit ICU  10-26-23 passed SBT and extubated.  Antimicrobials:  Anti-infectives (From admission, onward)    Start     Dose/Rate Route Frequency Ordered Stop   10/27/23 0000  doxycycline  (VIBRA-TABS) 100 MG tablet        100 mg Oral Every 12 hours 10/27/23 0845 10/31/23 2359   10/23/23 1515  doxycycline (VIBRA-TABS) tablet 100 mg        100 mg Oral Every 12 hours 10/23/23 1425 10/30/23 0959   10/17/23 1000  oseltamivir (TAMIFLU) capsule 30 mg        30 mg Oral 2 times daily 10/17/23 0728 10/18/23 2109   10/15/23 1000  oseltamivir (TAMIFLU) capsule 30 mg  Status:  Discontinued        30 mg Per Tube 2 times daily 10/15/23 0735 10/17/23 0729   10/14/23 1200  metroNIDAZOLE (FLAGYL) IVPB 500 mg  Status:  Discontinued        500 mg 100 mL/hr over 60 Minutes Intravenous Every 12 hours 10/13/23 2357 10/14/23 0935   10/14/23 1030  azithromycin (ZITHROMAX) 500 mg in sodium chloride 0.9 % 250 mL IVPB        500 mg 250 mL/hr over 60 Minutes Intravenous Every 24 hours 10/14/23 0935 10/16/23 1038   10/14/23 1000  oseltamivir (TAMIFLU) capsule 30 mg  Status:  Discontinued        30 mg Per Tube Daily 10/14/23 0935 10/15/23 0735   10/14/23 0000  cefTRIAXone (ROCEPHIN) 2 g in sodium chloride 0.9 % 100 mL IVPB        2 g 200 mL/hr over 30 Minutes Intravenous Every 24 hours 10/13/23 2357 10/17/23 2339   10/13/23 2200  oseltamivir (TAMIFLU) capsule 30 mg  Status:  Discontinued        30 mg Oral 2 times daily 10/13/23 1247 10/13/23 1847   10/13/23 2015  oseltamivir (TAMIFLU) capsule 30 mg  Status:  Discontinued        30 mg Per Tube Daily 10/13/23 1847 10/14/23 0935   10/13/23 1145  oseltamivir (TAMIFLU) capsule 75 mg        75 mg Oral  Once 10/13/23 1133 10/13/23 1141         Subjective: Patient laying in bed.  States she feels much better today.  Denies any shortness of breath.  Still with some chest soreness.  Denies any abdominal pain.  Cough improving.  States his stool occult was positive.  Still had some dark stool this morning.  Patient asking wether her diet can be advanced.  Objective: Vitals:   11/08/23 0515 11/08/23 0605 11/08/23 0740 11/08/23 0822  BP: (!) 126/51  (!)  99/35   Pulse: 78  82   Resp: 20  13   Temp: 98.1 F (36.7 C)  98.3 F (36.8 C)   TempSrc: Oral  Oral   SpO2: 100%  100% 100%  Weight:  60.9 kg    Height:        Intake/Output Summary (Last 24 hours) at 11/08/2023 1003 Last data filed at 11/08/2023 0924 Gross per 24 hour  Intake 1174 ml  Output 550 ml  Net 624 ml   Filed Weights   11/06/23 0431 11/07/23 0425 11/08/23 0605  Weight: 63.1 kg 60.6 kg 60.9 kg    Examination:  General exam: NAD Respiratory system: CTAB.  No wheezes, no crackles, no rhonchi.  Fair air movement.  Speaking in full sentences.  Cardiovascular system: Regular rate rhythm no murmurs rubs or gallops.  No JVD.  No pitting lower extremity edema.  Gastrointestinal system:  Abdomen is soft, nontender, nondistended, positive bowel sounds.  No rebound.  No guarding.   Central nervous system: Alert and oriented. No focal neurological deficits. Extremities: Symmetric 5 x 5 power. Skin: No rashes, lesions or ulcers Psychiatry: Judgement and insight appear normal. Mood & affect appropriate.     Data Reviewed: I have personally reviewed following labs and imaging studies  CBC: Recent Labs  Lab 11/06/23 1240 11/08/23 0522  WBC 6.7 5.7  HGB 7.1* 9.9*  HCT 23.0* 31.1*  MCV 111.7* 97.5  PLT 298 241    Basic Metabolic Panel: Recent Labs  Lab 11/06/23 1108 11/08/23 0522  NA 137 139  K 4.5 4.1  CL 104 106  CO2 26 25  GLUCOSE 251* 186*  BUN 14 8  CREATININE 0.95 0.83  CALCIUM 8.9 8.6*  MG  --  2.0  PHOS 2.7  --     GFR: Estimated Creatinine Clearance: 48.7 mL/min (by C-G formula based on SCr of 0.83 mg/dL).  Liver Function Tests: Recent Labs  Lab 11/06/23 1108  ALBUMIN 2.6*    CBG: Recent Labs  Lab 11/07/23 0548 11/07/23 1156 11/07/23 1542 11/07/23 2059 11/08/23 0622  GLUCAP 195* 304* 98 218* 182*     No results found for this or any previous visit (from the past 240 hours).       Radiology Studies: US  EKG SITE  RITE Result Date: 11/07/2023 If Site Rite image not attached, placement could not be confirmed due to current cardiac rhythm.       Scheduled Meds:  aspirin EC  81 mg Oral Daily   atorvastatin  80 mg Oral Daily   Chlorhexidine Gluconate Cloth  6 each Topical Daily   feeding supplement  1 Container Oral TID BM   heparin  5,000 Units Subcutaneous Q8H   insulin aspart  0-15 Units Subcutaneous TID WC   insulin aspart  0-5 Units Subcutaneous QHS   insulin aspart  8 Units Subcutaneous TID WC   insulin glargine-yfgn  14 Units Subcutaneous Daily   leptospermum manuka honey  1 Application Topical Daily   methocarbamol  500 mg Oral Once   metoprolol succinate  25 mg Oral Daily   mometasone-formoterol  2 puff Inhalation BID   multivitamin with minerals  1 tablet Oral Daily   pantoprazole (PROTONIX) IV  40 mg Intravenous Q12H   saccharomyces boulardii  250 mg Oral BID   sodium chloride flush  10-40 mL Intracatheter Q12H   sodium chloride flush  3 mL Intravenous Q12H   Continuous Infusions:   LOS: 26 days    Time spent: 35 minutes    Hilda Lovings, MD [facility]   To contact the attending provider between 7A-7P or the covering provider during after hours 7P-7A, please log into the web site [email or web address] and access using universal [employer] password for that web site. If you do not have the password, please call the hospital operator.  11/08/2023, 10:03 AM

## 2023-11-08 NOTE — Plan of Care (Signed)
  Problem: Fluid Volume: Goal: Ability to maintain a balanced intake and output will improve Outcome: Progressing   Problem: Health Behavior/Discharge Planning: Goal: Ability to identify and utilize available resources and services will improve Outcome: Progressing   Problem: Metabolic: Goal: Ability to maintain appropriate glucose levels will improve Outcome: Progressing   Problem: Nutritional: Goal: Maintenance of adequate nutrition will improve Outcome: Progressing

## 2023-11-08 NOTE — Plan of Care (Signed)
   Problem: Education: Goal: Ability to describe self-care measures that may prevent or decrease complications (Diabetes Survival Skills Education) will improve Outcome: Progressing Goal: Individualized Educational Video(s) Outcome: Progressing

## 2023-11-09 DIAGNOSIS — E1129 Type 2 diabetes mellitus with other diabetic kidney complication: Secondary | ICD-10-CM | POA: Diagnosis not present

## 2023-11-09 DIAGNOSIS — J9601 Acute respiratory failure with hypoxia: Secondary | ICD-10-CM | POA: Diagnosis not present

## 2023-11-09 DIAGNOSIS — N182 Chronic kidney disease, stage 2 (mild): Secondary | ICD-10-CM | POA: Diagnosis not present

## 2023-11-09 DIAGNOSIS — J101 Influenza due to other identified influenza virus with other respiratory manifestations: Secondary | ICD-10-CM | POA: Diagnosis not present

## 2023-11-09 LAB — CBC
HCT: 32.3 % — ABNORMAL LOW (ref 36.0–46.0)
Hemoglobin: 10.2 g/dL — ABNORMAL LOW (ref 12.0–15.0)
MCH: 31.2 pg (ref 26.0–34.0)
MCHC: 31.6 g/dL (ref 30.0–36.0)
MCV: 98.8 fL (ref 80.0–100.0)
Platelets: 226 10*3/uL (ref 150–400)
RBC: 3.27 MIL/uL — ABNORMAL LOW (ref 3.87–5.11)
RDW: 20.2 % — ABNORMAL HIGH (ref 11.5–15.5)
WBC: 5.3 10*3/uL (ref 4.0–10.5)
nRBC: 0 % (ref 0.0–0.2)

## 2023-11-09 LAB — GLUCOSE, CAPILLARY
Glucose-Capillary: 192 mg/dL — ABNORMAL HIGH (ref 70–99)
Glucose-Capillary: 183 mg/dL — ABNORMAL HIGH (ref 70–99)
Glucose-Capillary: 95 mg/dL (ref 70–99)
Glucose-Capillary: 134 mg/dL — ABNORMAL HIGH (ref 70–99)

## 2023-11-09 LAB — BASIC METABOLIC PANEL WITH GFR
Anion gap: 7 (ref 5–15)
BUN: 7 mg/dL — ABNORMAL LOW (ref 8–23)
CO2: 26 mmol/L (ref 22–32)
Calcium: 8.8 mg/dL — ABNORMAL LOW (ref 8.9–10.3)
Chloride: 106 mmol/L (ref 98–111)
Creatinine, Ser: 0.84 mg/dL (ref 0.44–1.00)
GFR, Estimated: >60 mL/min (ref >60)
Glucose, Bld: 184 mg/dL — ABNORMAL HIGH (ref 70–99)
Potassium: 4.4 mmol/L (ref 3.5–5.1)
Sodium: 139 mmol/L (ref 135–145)

## 2023-11-09 MED ORDER — JUVEN PO PACK
1.0000 | PACK | Freq: Two times a day (BID) | ORAL | Status: DC
  Administered 2023-11-09: 1 via ORAL
  Filled 2023-11-09: qty 1

## 2023-11-09 MED ORDER — ONDANSETRON HCL 4 MG/2ML IJ SOLN
4.0000 mg | Freq: Four times a day (QID) | INTRAMUSCULAR | Status: DC | PRN
  Administered 2023-11-09: 4 mg via INTRAVENOUS
  Filled 2023-11-09: qty 2

## 2023-11-09 NOTE — TOC Progression Note (Signed)
 Transition of Care [facility]) - Progression Note    Patient Details  Name: Anita Mccormick MRN: [id number] Date of Birth: 04/15/1946  [facility] CM/SW Contact  Arron Big, [place] Phone Number: 11/09/2023, 10:25 AM  Clinical Narrative:   Per daily meeting with treatment team, patient is not medically stable.   Appreal decision for SNF has gone up to [facility] - will take approximately 2 weeks (deadline for decision is April 22nd).   TOC will continue to follow.    Expected Discharge Plan: Skilled Nursing Facility Barriers to Discharge: Other (must enter comment) (Appeal with [facility] is pending)  Expected Discharge Plan and Services   Discharge Planning Services: CM Consult Post Acute Care Choice: Skilled Nursing Facility Living arrangements for the past 2 months: Single Family Home                                       Social Determinants of Health (SDOH) Interventions SDOH Screenings   Food Insecurity: No Food Insecurity (10/13/2023)  Housing: Low Risk  (10/13/2023)  Transportation Needs: No Transportation Needs (10/13/2023)  Utilities: Not At Risk (10/13/2023)  Alcohol Screen: Low Risk  (10/03/2022)  Depression (PHQ2-9): Low Risk  (07/06/2023)  Financial Resource Strain: Low Risk  (10/03/2022)  Physical Activity: Inactive (10/03/2022)  Social Connections: Moderately Isolated (10/13/2023)  Stress: No Stress Concern Present (10/03/2022)  Tobacco Use: High Risk (10/13/2023)    Readmission Risk Interventions    10/19/2023   10:43 AM  Readmission Risk Prevention Plan  Post Dischage Appt Complete  Medication Screening Complete  Transportation Screening Complete

## 2023-11-09 NOTE — Progress Notes (Signed)
 Nutrition Follow-up  DOCUMENTATION CODES:   Non-severe (moderate) malnutrition in context of acute illness/injury  INTERVENTION:  -1 packet Juven BID, each packet provides 95 calories, 2.5 grams of protein (collagen), and 9.8 grams of carbohydrate (3 grams sugar); also contains 7 grams of L-arginine and L-glutamine, 300 mg vitamin C, 15 mg vitamin E, 1.2 mcg vitamin B-12, 9.5 mg zinc, 200 mg calcium, and 1.5 g  Calcium Beta-hydroxy-Beta-methylbutyrate to support wound healing  Magic Cup- wild berry trial.   Encourage liberalized diet  NUTRITION DIAGNOSIS:   Moderate Malnutrition related to acute illness as evidenced by mild fat depletion, mild muscle depletion. *still relevant  GOAL:   Patient will meet greater than or equal to 90% of their needs *unmet on clear liquid diet  MONITOR:   PO intake, Supplement acceptance  REASON FOR ASSESSMENT:   Ventilator    ASSESSMENT:   78 y.o female with PMH of breast cancer, HTN, HLD, CVA, left eye blindness, T2DM, FALD/cirrhosis, GERD, anemia, CKD 2, Covid 08/2023. Presented with worsening SOB. Found to be Influenza A positive. While awaiting bed placement had cardiac arrest and was intubated. 4/12- PICC placement, 2 units of PRBCs 4/13-  Patient noted to become anemic. Stool tested heme positive on 4/13 and working to find underlying problem.  Clear liquid diet for EGD and colonoscopy on 4/15. Pt reported completing full liquid trays. Pt does not like ensure enlive or boost breeze due to taste. Pt reports liking italian ice so willing to try wild berry magic cup. Pt reports watching carbohydrate intake due to sugars being high. RD emphasized limiting restriction due to age and wt trending down in recent weeks.   Medications reviewed.   Labs reviewed: Albumin 2.6 L, CBGs 77-233 x24h   Intake/Output Summary (Last 24 hours) at 11/09/2023 1122 Last data filed at 11/09/2023 0408 Gross per 24 hour  Intake 480 ml  Output 2350 ml  Net  -1870 ml    Weights reviewed. Weight trending down.    Diet Order:   Diet Order             Diet NPO time specified Except for: Sips with Meds  Diet effective midnight           Diet clear liquid Room service appropriate? Yes; Fluid consistency: Thin  Diet effective 0500 tomorrow           Diet - low sodium heart healthy                   EDUCATION NEEDS:   Not appropriate for education at this time  Skin:  Skin Assessment: Skin Integrity Issues: (new 4/7) Skin Integrity Issues:: Stage II Stage II: buttocks  Last BM:  4/14  Height:   Ht Readings from Last 1 Encounters:  11/03/23 5\' 2"  (1.575 m)    Weight:   Wt Readings from Last 1 Encounters:  11/09/23 59.5 kg    Ideal Body Weight:  50 kg  BMI:  Body mass index is 23.99 kg/m.  Estimated Nutritional Needs:   Kcal:  1500-1700 kcal  Protein:  75-95 gm  Fluid:  >1.5L/day  Laren Player, MPH, RD, LDN Clinical Dietitian Contact information can be found at [facility]

## 2023-11-09 NOTE — Progress Notes (Signed)
 Daily Progress Note  DOA: 10/13/2023 Hospital Day: 41   Chief Complaint: Anemia, FOBT+  ASSESSMENT    78 y.o. year old female with multiple medical problems not limited to hypertension, history of CVA,  diabetes, CKD 2, chronic diastolic heart failure, cirrhosis, colon polyps. Prolonged hospitalization for Flu A / SHOB, NSTEMI, PEA arrest, left pneumothorax. Required vasopressors / ventilatory support.  Got cardiac cath this admission, not candidate for byass. GI saw in consult 4/12 for declining hbg.   Chronic iron deficiency anemia  Worsening of chronic anemia this admission FOBT+.  Has been getting IV / PO iron outpatient with improvement in Hgb to 12 ,  ferritin 215 and normal TIBC/ . This admission hgb declined to 7 on heparin, lovenox and asa   PEA arrest CAD / NSTEMI  Cardiac cath on 3/25. Triple-vessel disease found but none of them amenable to PCI. Not thought to be a good candidate for bypass surgery.   History of colon polyps ( TA and TVA in 2021)  Cirrhosis ( CT scan 2023) / ? 2/2 to NAFLD. Compensated  PLAN   --Schedule for an EGD and colonoscopy to be done tomorrow. The risks and benefits of EGD and colonoscopy with possible polypectomy / biopsies were discussed and the patient agrees to proceed.   Subjective   No complaints. Cough is better.    Objective   GI Studies:   *Most recent Colonoscopy 2021 Dr. Kinnie Scales with 3 polyps removed largest 10 mm and noted to have large internal hemorrhoids. Path from the polyp showed the rectal polyp. Tubulovillous adenoma and descending colon polyp was a tubular adenoma.   Recent Labs    11/06/23 1240 11/08/23 0522 11/08/23 1643 11/09/23 0542  WBC 6.7 5.7  --  5.3  HGB 7.1* 9.9* 9.8* 10.2*  HCT 23.0* 31.1* 31.1* 32.3*  MCV 111.7* 97.5  --  98.8  PLT 298 241  --  226   Recent Labs    11/07/23 1120  VITAMINB12 1,139*  FERRITIN 215  TIBC 353  IRONPCTSAT 16   Recent Labs    11/08/23 0522 11/09/23 0542   NA 139 139  K 4.1 4.4  CL 106 106  CO2 25 26  GLUCOSE 186* 184*  BUN 8 7*  CREATININE 0.83 0.84  CALCIUM 8.6* 8.8*   Imaging:  [place] EKG SITE RITE If Site Rite image not attached, placement could not be confirmed due to  current cardiac rhythm.    Scheduled inpatient medications:   aspirin EC  81 mg Oral Daily   atorvastatin  80 mg Oral Daily   bisacodyl  10 mg Oral Once   Chlorhexidine Gluconate Cloth  6 each Topical Daily   feeding supplement  1 Container Oral TID BM   heparin  5,000 Units Subcutaneous Q8H   insulin aspart  0-15 Units Subcutaneous TID WC   insulin aspart  0-5 Units Subcutaneous QHS   insulin aspart  8 Units Subcutaneous TID WC   insulin glargine-yfgn  14 Units Subcutaneous Daily   leptospermum manuka honey  1 Application Topical Daily   methocarbamol  500 mg Oral Once   metoprolol succinate  25 mg Oral Daily   mometasone-formoterol  2 puff Inhalation BID   multivitamin with minerals  1 tablet Oral Daily   pantoprazole (PROTONIX) IV  40 mg Intravenous Q12H   polyethylene glycol powder  119 g Oral Once   Followed by   polyethylene glycol powder  119 g Oral Once  saccharomyces boulardii  250 mg Oral BID   sodium chloride flush  10-40 mL Intracatheter Q12H   sodium chloride flush  3 mL Intravenous Q12H   Continuous inpatient infusions:  PRN inpatient medications: acetaminophen **OR** acetaminophen, guaiFENesin, hydrALAZINE, ipratropium-albuterol, loperamide, melatonin, mouth rinse, mouth rinse, sodium chloride flush, white petrolatum  Vital signs in last 24 hours: Temp:  [97.5 F (36.4 C)-99 F (37.2 C)] 98 F (36.7 C) (04/14 1055) Pulse Rate:  [73-84] 75 (04/14 1055) Resp:  [16-24] 20 (04/14 1055) BP: (87-146)/(35-63) 113/48 (04/14 1055) SpO2:  [100 %] 100 % (04/14 1055) Weight:  [59.5 kg] 59.5 kg (04/14 0411) Last BM Date : 11/09/23  Intake/Output Summary (Last 24 hours) at 11/09/2023 1223 Last data filed at 11/09/2023 0408 Gross per 24 hour   Intake 240 ml  Output 1950 ml  Net -1710 ml    Intake/Output from previous day: 04/13 0701 - 04/14 0700 In: 720 [P.O.:720] Out: 2550 [Urine:2550] Intake/Output this shift: No intake/output data recorded.   Physical Exam:  General: Alert female in NAD in bedside chair Heart:  Regular rate and rhythm.  Pulmonary: Normal respiratory effort Abdomen: Soft, nondistended, nontender. Normal bowel sounds. Extremities: No lower extremity edema  Neurologic: Alert and oriented Psych: Pleasant. Cooperative     LOS: 27 days   Willette Cluster ,NP 11/09/2023, 12:23 PM

## 2023-11-09 NOTE — Plan of Care (Signed)
  Problem: Coping: Goal: Ability to adjust to condition or change in health will improve Outcome: Progressing   Problem: Health Behavior/Discharge Planning: Goal: Ability to identify and utilize available resources and services will improve Outcome: Progressing   Problem: Metabolic: Goal: Ability to maintain appropriate glucose levels will improve Outcome: Progressing   Problem: Nutritional: Goal: Maintenance of adequate nutrition will improve Outcome: Progressing   Problem: Skin Integrity: Goal: Risk for impaired skin integrity will decrease Outcome: Progressing

## 2023-11-09 NOTE — Progress Notes (Signed)
 PROGRESS NOTE    Anita Mccormick  WUJ:[id number] DOB: 13-Oct-1945 DOA: 10/13/2023 PCP: Deeann Saint, MD    Chief Complaint  Patient presents with   Shortness of Breath   Cough   Cardiac Arrest    Brief Narrative:  78 year old female with history of tobacco abuse, hypertension, hyperlipidemia, unspecified CVA, diabetes mellitus type 2, CKD stage II, chronic diastolic heart failure, anemia, breast cancer, NAFLD/cirrhosis and COVID-19 infection in 08/2023 presented with worsening shortness of breath. On presentation, she required supplemental oxygen and was found to be influenza A positive; chest x-ray was negative for acute infiltrates. She was started on Tamiflu. Subsequently, had a syncopal event and was found to be more hypoxic. Later on, she was found to be in PEA bradycardic/asystolic arrest with ROSC after 3 rounds of ACLS. She was subsequently intubated and transferred to ICU. PCCM and cardiology were consulted. She was then found to have left pneumothorax for which pigtail catheter was placed. She also needed vasopressors for shock along with IV antibiotics. Troponins trended to more than 24,000. She was started on heparin drip. Echo showed EF of 60 to 65% with no valvular issues. She was also started on IV Solu-Medrol by PCCM. Subsequently, vasopressors were stopped. She was extubated on 10/17/2023.Chest tube was subsequently removed. She was transferred to [facility] service from 10/18/2023 onwards. Now doing great, ready for DC.  Patient noted to become anemic, FOBT obtained positive.  GI consulted.  Awaiting SNF placement.     Assessment & Plan:   Principal Problem:   Acute respiratory failure with hypoxia (HCC) Active Problems:   Type II diabetes mellitus with renal manifestations (HCC)   Hyperlipidemia   Essential hypertension   History of CVA (cerebrovascular accident)   History of breast cancer   Fatty liver disease, nonalcoholic   Chronic kidney disease (CKD), stage II (mild)    Chronic diastolic CHF (congestive heart failure) (HCC)   Anemia   Cirrhosis of liver without ascites (HCC)   Influenza A   Cardiac arrest (HCC)   Pneumothorax on left   Malnutrition of moderate degree   Non-ST elevation (NSTEMI) myocardial infarction (HCC)   Heme positive stool  #1 PEA/cardiac arrest/CAD/non-STEMI/chronic diastolic heart failure/probable cardiogenic shock resolved -Patient with ROSC after 3 rounds of CPR. -Patient required ICU care with intubation and vasopressor support and broad-spectrum antibiotics. -Patient subsequently extubated, weaned off pressors and transferred to [facility] service on 10/18/2023. -Troponins noted to be elevated > 24,000. -Patient noted to have received a heparin drip. -Patient seen in consultation by cardiology, underwent cardiac catheterization 10/20/2023 and triple-vessel disease noted but not amenable to PCI. -It was felt per cardiology that patient was not a candidate for bypass surgery and medical management recommended. -Continue aspirin, statin. -BP was soft and as such Norvasc discontinued.   -Continue Toprol-XL.  -Cardiology was following but has signed off. -Outpatient follow-up with cardiology.  2.  Acute respiratory failure with hypoxia secondary to Influenza A  pneumonia/COPD exacerbation -Status post 5 days Tamiflu and steroids and IV Rocephin. -Improved clinically and weaned off oxygen currently with sats 100% on room air. -Status post 7 days doxycycline with improvement with leukocytosis.   -Patient also received trial of IV Lasix.  3.  Mildly abnormal LFTs -Likely related to cardiac arrest.  4.  Pneumothorax -Status post pigtail catheter placement which has subsequently been removed. -Patient not hypoxic.  5.  Hypertension -BP was soft but improved.  -Continue to hold amlodipine.   -Toprol-XL.  -Status post transfusion 2  units PRBCs.    6.  Syncope -2D echo and cardiac catheterization as noted above. -Patient with no  neurological deficits. -No significant abnormalities noted on telemetry. -Workup completed.  7.  Thrombocytopenia -Platelet count fluctuating and has improved currently at 226 this morning.  -Would likely need outpatient follow-up with hematology.  8.  History of unspecified CVA/hyperlipidemia -No focal neurological deficits. -Continue aspirin and statin for secondary stroke prophylaxis.   9.  Macrocytic anemia/possible iron deficiency anemia -Status post IV iron. -Vitamin B12, folate normal. -Continue oral iron supplementation. -Patient does endorse some melanotic stools. -Hemoglobin currently at 10.2 ( posttransfusion 2 units PRBCs) from down to 7.1 from 8.3 on 10/31/2023 from 11.2 on admission. -FOBT positive. -Continue to hold Plavix. - Currently on clear liquid diet.  -Continue IV PPI every 12 hours. -Patient seen in consultation by GI who are recommending EGD and colonoscopy to be done tomorrow. -Appreciate GI input and recommendations.  10.  Well-controlled diabetes mellitus type 2 with hyperglycemia -Hemoglobin A1c 5.0. -CBG 192 this morning. -Continue Semglee to 14 units daily.   -Continue meal coverage NovoLog 8 units 3 times daily with meals.   -SSI.  11.  NAFLD/cirrhosis -Compensated. -Outpatient follow-up with GI.  12.  History of breast cancer -Outpatient follow-up with oncology.   13.  Moderate protein calorie malnutrition - Continue nutritional supplementation.   14.  Debility -PT/OT recommending SNF placement early on in the hospitalization and patient improving may be able to go home with home health however will continue to monitor..  15. Pressure injury Pressure Injury 11/02/23 Buttocks Right;Proximal Stage 2 -  Partial thickness loss of dermis presenting as a shallow open injury with a red, pink wound bed without slough. (Active)  11/02/23 0720  Location: Buttocks  Location Orientation: Right;Proximal  Staging: Stage 2 -  Partial thickness loss of  dermis presenting as a shallow open injury with a red, pink wound bed without slough.  Wound Description (Comments):   Present on Admission: No     16.  Goals of care -Initially pursuing short-term rehab, however insurance authorization with peer-to-peer was denied secondary to patient being able to ambulate 75 feet with CGA.  Patient was to be discharged home with home health however discussion with the patient's daughter stating that the patient is unable to care for herself at home safely and is requesting an expedited appeal for SNF.   The daughter is very concerned about the wellbeing of her mother and likely unsafe to be home without extra aid.  Patient lives with her husband who is also frail and unable to help.  She is adamant about pursuing short-term rehab.  Working closely with case management on available options and would hold off on discharging today given the concern for unsafe disposition.   Dr Macarthur Savory spoke with dauhgter, still awaiting decision from insurance appeal.  Dr Macarthur Savory discussed possibility of going home if the appeal is denied versus paying out-of-pocket for skilled care or aide at home.   DVT prophylaxis: Heparin Code Status: Full Family Communication: Updated patient.  Updated daughter on speaker phone.   Disposition: Awaiting appeal process from denial to SNF  Status is: Inpatient Remains inpatient appropriate because: Disposition   Consultants:  PCCM Cardiology: Dr.Thukkani 10/14/2023 Gastroenterology: Dr. Dominic Friendly III 11/07/2023  Procedures: CT head 10/15/2023 2D echo 10/14/2023 Cardiac catheterization 10/20/2023 Chest tube placement, chest tube removal 10/17/2023 Transfused 2 units PRBCs pending 11/07/2023 PICC line placement 11/07/2023 2 units PRBCs 11/07/2023  Significant Hospital Events: Including procedures, antibiotic  start and stop dates in addition to other pertinent events   3/18 admit to [facility] Flu PNA. Syncope. Coded. Tube after. Admit ICU  11/01/2023  passed SBT and extubated.  Antimicrobials:  Anti-infectives (From admission, onward)    Start     Dose/Rate Route Frequency Ordered Stop   10/27/23 0000  doxycycline (VIBRA-TABS) 100 MG tablet        100 mg Oral Every 12 hours 10/27/23 0845 10/31/23 2359   10/23/23 1515  doxycycline (VIBRA-TABS) tablet 100 mg        100 mg Oral Every 12 hours 10/23/23 1425 10/30/23 0959   Nov 01, 2023 1000  oseltamivir (TAMIFLU) capsule 30 mg        30 mg Oral 2 times daily 11-01-2023 0728 10/18/23 2109   10/15/23 1000  oseltamivir (TAMIFLU) capsule 30 mg  Status:  Discontinued        30 mg Per Tube 2 times daily 10/15/23 0735 2023/11/01 0729   10/14/23 1200  metroNIDAZOLE (FLAGYL) IVPB 500 mg  Status:  Discontinued        500 mg 100 mL/hr over 60 Minutes Intravenous Every 12 hours 10/13/23 2357 10/14/23 0935   10/14/23 1030  azithromycin (ZITHROMAX) 500 mg in sodium chloride 0.9 % 250 mL IVPB        500 mg 250 mL/hr over 60 Minutes Intravenous Every 24 hours 10/14/23 0935 10/16/23 1038   10/14/23 1000  oseltamivir (TAMIFLU) capsule 30 mg  Status:  Discontinued        30 mg Per Tube Daily 10/14/23 0935 10/15/23 0735   10/14/23 0000  cefTRIAXone (ROCEPHIN) 2 g in sodium chloride 0.9 % 100 mL IVPB        2 g 200 mL/hr over 30 Minutes Intravenous Every 24 hours 10/13/23 2357 November 01, 2023 2339   10/13/23 2200  oseltamivir (TAMIFLU) capsule 30 mg  Status:  Discontinued        30 mg Oral 2 times daily 10/13/23 1247 10/13/23 1847   10/13/23 2015  oseltamivir (TAMIFLU) capsule 30 mg  Status:  Discontinued        30 mg Per Tube Daily 10/13/23 1847 10/14/23 0935   10/13/23 1145  oseltamivir (TAMIFLU) capsule 75 mg        75 mg Oral  Once 10/13/23 1133 10/13/23 1141         Subjective: Sitting up in bed eating an [place] ICD.  Denies any chest pain or shortness of breath.  No abdominal pain.  Overall feeling better.  Still with dark stools.  Today she saw gastroenterology this morning and planning on EGD/colonoscopy  tomorrow.  Husband at bedside.  Daughter on speaker phone.   Objective: Vitals:   11/09/23 0721 11/09/23 0855 11/09/23 1055 11/09/23 1551  BP: (!) 120/40 135/61 (!) 113/48 (!) 141/57  Pulse:  77 75 78  Resp: 19 16 20 20   Temp: 98.3 F (36.8 C) 98.4 F (36.9 C) 98 F (36.7 C) 99.1 F (37.3 C)  TempSrc: Oral Oral Oral Oral  SpO2: 100% 100% 100% 100%  Weight:      Height:        Intake/Output Summary (Last 24 hours) at 11/09/2023 1816 Last data filed at 11/09/2023 1458 Gross per 24 hour  Intake 480 ml  Output 2750 ml  Net -2270 ml   Filed Weights   11/07/23 0425 11/08/23 0605 11/09/23 0411  Weight: 60.6 kg 60.9 kg 59.5 kg    Examination:  General exam: NAD Respiratory system: Lungs clear to auscultation  bilaterally.  No wheezes, no crackles, no rhonchi.  Fair air movement.  Speaking in full sentences.  Cardiovascular system: RRR no murmurs rubs or gallops.  No JVD.  No pitting lower extremity edema.  Gastrointestinal system: Abdomen is soft, nontender, nondistended, positive bowel sounds.  No rebound.  No guarding.  Central nervous system: Alert and oriented. No focal neurological deficits. Extremities: Symmetric 5 x 5 power. Skin: No rashes, lesions or ulcers Psychiatry: Judgement and insight appear normal. Mood & affect appropriate.     Data Reviewed: I have personally reviewed following labs and imaging studies  CBC: Recent Labs  Lab 11/06/23 1240 11/08/23 0522 11/08/23 1643 11/09/23 0542  WBC 6.7 5.7  --  5.3  HGB 7.1* 9.9* 9.8* 10.2*  HCT 23.0* 31.1* 31.1* 32.3*  MCV 111.7* 97.5  --  98.8  PLT 298 241  --  226    Basic Metabolic Panel: Recent Labs  Lab 11/06/23 1108 11/08/23 0522 11/09/23 0542  NA 137 139 139  K 4.5 4.1 4.4  CL 104 106 106  CO2 26 25 26   GLUCOSE 251* 186* 184*  BUN 14 8 7*  CREATININE 0.95 0.83 0.84  CALCIUM 8.9 8.6* 8.8*  MG  --  2.0  --   PHOS 2.7  --   --     GFR: Estimated Creatinine Clearance: 44.4 mL/min (by C-G  formula based on SCr of 0.84 mg/dL).  Liver Function Tests: Recent Labs  Lab 11/06/23 1108  ALBUMIN 2.6*    CBG: Recent Labs  Lab 11/08/23 1646 11/08/23 2126 11/09/23 0650 11/09/23 1058 11/09/23 1554  GLUCAP 206* 77 192* 183* 95     No results found for this or any previous visit (from the past 240 hours).       Radiology Studies: No results found.       Scheduled Meds:  aspirin EC  81 mg Oral Daily   atorvastatin  80 mg Oral Daily   Chlorhexidine Gluconate Cloth  6 each Topical Daily   feeding supplement  1 Container Oral TID BM   heparin  5,000 Units Subcutaneous Q8H   insulin aspart  0-15 Units Subcutaneous TID WC   insulin aspart  0-5 Units Subcutaneous QHS   insulin aspart  8 Units Subcutaneous TID WC   insulin glargine-yfgn  14 Units Subcutaneous Daily   leptospermum manuka honey  1 Application Topical Daily   methocarbamol  500 mg Oral Once   metoprolol succinate  25 mg Oral Daily   mometasone-formoterol  2 puff Inhalation BID   multivitamin with minerals  1 tablet Oral Daily   nutrition supplement (JUVEN)  1 packet Oral BID BM   pantoprazole (PROTONIX) IV  40 mg Intravenous Q12H   polyethylene glycol powder  119 g Oral Once   Followed by   polyethylene glycol powder  119 g Oral Once   saccharomyces boulardii  250 mg Oral BID   sodium chloride flush  10-40 mL Intracatheter Q12H   sodium chloride flush  3 mL Intravenous Q12H   Continuous Infusions:   LOS: 27 days    Time spent: 35 minutes    Ramiro Harvest, MD [facility]   To contact the attending provider between 7A-7P or the covering provider during after hours 7P-7A, please log into the web site [email or web address] and access using universal [place] password for that web site. If you do not have the password, please call the hospital operator.  11/09/2023, 6:16 PM

## 2023-11-09 NOTE — Progress Notes (Signed)
 Physical Therapy Treatment Patient Details Name: Anita Mccormick MRN: [id number] DOB: 01/27/1946 Today's Date: 11/09/2023   History of Present Illness Pt is 78 yo presenting to [facility] ED on 3/18 due to worsening SOB, orthopnea and wheezing with residual cough since COVID. In ED, pt with syncopal event and later PEA bradycardic/asystolic arrest with ROSC after 3 rounds of ACLS. 3/18-3/23 ICU stay. 3/18-3/22 intubation. 3/25 cardiac catheterization. PMH: HTN, HLD, CVA, DM, CKD II, HFEF, anemia, breast cancer, NAFLD/cirrhosis and COVID 08/2023    PT Comments  Patient reporting her "backside" is sore from being in bed and from sitting in chair (does have an air cushion for chair). Discussed how she is doing with bed mobility and she reports she is getting a rail for her bed at home as she can get OOB without assist with a rail. Agreed to ambulate and progressed distance to 160 ft with RW and CGA. She has a tendency to push RW slightly too far ahead, mostly due to her flexed posture that she cannot correct. No dyspnea and able to converse while walking. Discharge plan updated to HHPT anticipating she will go home prior to insurance deadline of 4/22.     If plan is discharge home, recommend the following: A little help with walking and/or transfers;Assist for transportation;Help with stairs or ramp for entrance;Assistance with cooking/housework;A little help with bathing/dressing/bathroom   Can travel by private vehicle     Yes  Equipment Recommendations  Rolling walker (2 wheels);BSC/3in1    Recommendations for Other Services       Precautions / Restrictions Precautions Precautions: Fall Recall of Precautions/Restrictions: Intact Restrictions Weight Bearing Restrictions Per Provider Order: No     Mobility  Bed Mobility               General bed mobility comments: Not assessed. Pt greeted in recliner chair    Transfers Overall transfer level: Needs assistance Equipment used: Rolling  walker (2 wheels) Transfers: Sit to/from Stand Sit to Stand: Supervision           General transfer comment: Pt did not require cuing; supervision for safety only    Ambulation/Gait Ambulation/Gait assistance: Contact guard assist Gait Distance (Feet): 160 Feet Assistive device: Rolling walker (2 wheels) Gait Pattern/deviations: Step-through pattern, Decreased step length - right, Decreased step length - left, Decreased stride length, Trunk flexed, Wide base of support Gait velocity: reduced Gait velocity interpretation: 1.31 - 2.62 ft/sec, indicative of limited community ambulator   General Gait Details: Pt ambulated with short, small steps. She demonstrated adequate push off, heel strike, and foot clearence. She maintained fwd flex trunk over RW, VC/TC to correct posture. As pt fatigued, her flex posture increased slightly however maintained control of RW. Avoiding obstacles without assist   Stairs             Wheelchair Mobility     Tilt Bed    Modified Rankin (Stroke Patients Only)       Balance Overall balance assessment: Needs assistance Sitting-balance support: Feet supported, No upper extremity supported Sitting balance-Leahy Scale: Fair Sitting balance - Comments: Pt sat EOB with supervision. She attempted to address pericare in sitting, reaching outside her BOS.   Standing balance support: Reliant on assistive device for balance, No upper extremity supported Standing balance-Leahy Scale: Fair                              Communication Communication Communication: No apparent  difficulties  Cognition Arousal: Alert Behavior During Therapy: WFL for tasks assessed/performed   PT - Cognitive impairments: No apparent impairments                         Following commands: Intact      Cueing Cueing Techniques: Verbal cues  Exercises Other Exercises Other Exercises: pt reports doing leg raises and ankle pumps when in bed.  Prefers her feet elevated in chair and demonstrated ankle pumps without difficulty.    General Comments General comments (skin integrity, edema, etc.): VSS on RA      Pertinent Vitals/Pain Pain Assessment Pain Assessment: Faces Faces Pain Scale: Hurts whole lot Pain Location: "back side" Pain Descriptors / Indicators: Grimacing, Guarding Pain Intervention(s): Limited activity within patient's tolerance, Monitored during session, Repositioned, Other (comment) (pt has air cushion)    Home Living                          Prior Function            PT Goals (current goals can now be found in the care plan section) Acute Rehab PT Goals Patient Stated Goal: go home Time For Goal Achievement: 11/13/23 Potential to Achieve Goals: Good Progress towards PT goals: Progressing toward goals    Frequency    Min 2X/week      PT Plan      Co-evaluation              AM-PAC PT "6 Clicks" Mobility   Outcome Measure  Help needed turning from your back to your side while in a flat bed without using bedrails?: A Little Help needed moving from lying on your back to sitting on the side of a flat bed without using bedrails?: A Little Help needed moving to and from a bed to a chair (including a wheelchair)?: A Little Help needed standing up from a chair using your arms (e.g., wheelchair or bedside chair)?: A Little Help needed to walk in hospital room?: A Little Help needed climbing 3-5 steps with a railing? : A Little 6 Click Score: 18    End of Session Equipment Utilized During Treatment: Gait belt Activity Tolerance: Patient tolerated treatment well Patient left: in chair;with call bell/phone within reach   PT Visit Diagnosis: Unsteadiness on feet (R26.81);Other abnormalities of gait and mobility (R26.89)     Time: 1610-9604 PT Time Calculation (min) (ACUTE ONLY): 21 min  Charges:    $Gait Training: 8-22 mins PT General Charges $$ ACUTE PT VISIT: 1  Visit                      Gayle Kava, PT Acute Rehabilitation Services  Office [phone]    Guilford Leep 11/09/2023, 11:09 AM

## 2023-11-09 NOTE — H&P (View-Only) (Signed)
 Daily Progress Note  DOA: 10/13/2023 Hospital Day: 41   Chief Complaint: Anemia, FOBT+  ASSESSMENT    78 y.o. year old female with multiple medical problems not limited to hypertension, history of CVA,  diabetes, CKD 2, chronic diastolic heart failure, cirrhosis, colon polyps. Prolonged hospitalization for Flu A / SHOB, NSTEMI, PEA arrest, left pneumothorax. Required vasopressors / ventilatory support.  Got cardiac cath this admission, not candidate for byass. GI saw in consult 4/12 for declining hbg.   Chronic iron deficiency anemia  Worsening of chronic anemia this admission FOBT+.  Has been getting IV / PO iron outpatient with improvement in Hgb to 12 ,  ferritin 215 and normal TIBC/ . This admission hgb declined to 7 on heparin, lovenox and asa   PEA arrest CAD / NSTEMI  Cardiac cath on 3/25. Triple-vessel disease found but none of them amenable to PCI. Not thought to be a good candidate for bypass surgery.   History of colon polyps ( TA and TVA in 2021)  Cirrhosis ( CT scan 2023) / ? 2/2 to NAFLD. Compensated  PLAN   --Schedule for an EGD and colonoscopy to be done tomorrow. The risks and benefits of EGD and colonoscopy with possible polypectomy / biopsies were discussed and the patient agrees to proceed.   Subjective   No complaints. Cough is better.    Objective   GI Studies:   *Most recent Colonoscopy 2021 Dr. Kinnie Scales with 3 polyps removed largest 10 mm and noted to have large internal hemorrhoids. Path from the polyp showed the rectal polyp. Tubulovillous adenoma and descending colon polyp was a tubular adenoma.   Recent Labs    11/06/23 1240 11/08/23 0522 11/08/23 1643 11/09/23 0542  WBC 6.7 5.7  --  5.3  HGB 7.1* 9.9* 9.8* 10.2*  HCT 23.0* 31.1* 31.1* 32.3*  MCV 111.7* 97.5  --  98.8  PLT 298 241  --  226   Recent Labs    11/07/23 1120  VITAMINB12 1,139*  FERRITIN 215  TIBC 353  IRONPCTSAT 16   Recent Labs    11/08/23 0522 11/09/23 0542   NA 139 139  K 4.1 4.4  CL 106 106  CO2 25 26  GLUCOSE 186* 184*  BUN 8 7*  CREATININE 0.83 0.84  CALCIUM 8.6* 8.8*   Imaging:  [place] EKG SITE RITE If Site Rite image not attached, placement could not be confirmed due to  current cardiac rhythm.    Scheduled inpatient medications:   aspirin EC  81 mg Oral Daily   atorvastatin  80 mg Oral Daily   bisacodyl  10 mg Oral Once   Chlorhexidine Gluconate Cloth  6 each Topical Daily   feeding supplement  1 Container Oral TID BM   heparin  5,000 Units Subcutaneous Q8H   insulin aspart  0-15 Units Subcutaneous TID WC   insulin aspart  0-5 Units Subcutaneous QHS   insulin aspart  8 Units Subcutaneous TID WC   insulin glargine-yfgn  14 Units Subcutaneous Daily   leptospermum manuka honey  1 Application Topical Daily   methocarbamol  500 mg Oral Once   metoprolol succinate  25 mg Oral Daily   mometasone-formoterol  2 puff Inhalation BID   multivitamin with minerals  1 tablet Oral Daily   pantoprazole (PROTONIX) IV  40 mg Intravenous Q12H   polyethylene glycol powder  119 g Oral Once   Followed by   polyethylene glycol powder  119 g Oral Once  saccharomyces boulardii  250 mg Oral BID   sodium chloride flush  10-40 mL Intracatheter Q12H   sodium chloride flush  3 mL Intravenous Q12H   Continuous inpatient infusions:  PRN inpatient medications: acetaminophen **OR** acetaminophen, guaiFENesin, hydrALAZINE, ipratropium-albuterol, loperamide, melatonin, mouth rinse, mouth rinse, sodium chloride flush, white petrolatum  Vital signs in last 24 hours: Temp:  [97.5 F (36.4 C)-99 F (37.2 C)] 98 F (36.7 C) (04/14 1055) Pulse Rate:  [73-84] 75 (04/14 1055) Resp:  [16-24] 20 (04/14 1055) BP: (87-146)/(35-63) 113/48 (04/14 1055) SpO2:  [100 %] 100 % (04/14 1055) Weight:  [59.5 kg] 59.5 kg (04/14 0411) Last BM Date : 11/09/23  Intake/Output Summary (Last 24 hours) at 11/09/2023 1223 Last data filed at 11/09/2023 0408 Gross per 24 hour   Intake 240 ml  Output 1950 ml  Net -1710 ml    Intake/Output from previous day: 04/13 0701 - 04/14 0700 In: 720 [P.O.:720] Out: 2550 [Urine:2550] Intake/Output this shift: No intake/output data recorded.   Physical Exam:  General: Alert female in NAD in bedside chair Heart:  Regular rate and rhythm.  Pulmonary: Normal respiratory effort Abdomen: Soft, nondistended, nontender. Normal bowel sounds. Extremities: No lower extremity edema  Neurologic: Alert and oriented Psych: Pleasant. Cooperative     LOS: 27 days   Mai Schwalbe ,NP 11/09/2023, 12:23 PM

## 2023-11-10 ENCOUNTER — Inpatient Hospital Stay (HOSPITAL_COMMUNITY): Admitting: Anesthesiology

## 2023-11-10 ENCOUNTER — Encounter (HOSPITAL_COMMUNITY)
Admission: EM | Disposition: A | Discharge status: Home Health | Payer: Self-pay | Source: Home / Self Care | Attending: Internal Medicine

## 2023-11-10 DIAGNOSIS — K2970 Gastritis, unspecified, without bleeding: Secondary | ICD-10-CM

## 2023-11-10 DIAGNOSIS — I5032 Chronic diastolic (congestive) heart failure: Secondary | ICD-10-CM

## 2023-11-10 DIAGNOSIS — K3189 Other diseases of stomach and duodenum: Secondary | ICD-10-CM

## 2023-11-10 DIAGNOSIS — K31819 Angiodysplasia of stomach and duodenum without bleeding: Secondary | ICD-10-CM

## 2023-11-10 DIAGNOSIS — F17210 Nicotine dependence, cigarettes, uncomplicated: Secondary | ICD-10-CM

## 2023-11-10 DIAGNOSIS — B3781 Candidal esophagitis: Secondary | ICD-10-CM

## 2023-11-10 DIAGNOSIS — D175 Benign lipomatous neoplasm of intra-abdominal organs: Secondary | ICD-10-CM

## 2023-11-10 DIAGNOSIS — K2289 Other specified disease of esophagus: Secondary | ICD-10-CM

## 2023-11-10 DIAGNOSIS — K7460 Unspecified cirrhosis of liver: Secondary | ICD-10-CM | POA: Diagnosis not present

## 2023-11-10 DIAGNOSIS — K635 Polyp of colon: Secondary | ICD-10-CM

## 2023-11-10 DIAGNOSIS — D123 Benign neoplasm of transverse colon: Secondary | ICD-10-CM

## 2023-11-10 DIAGNOSIS — N182 Chronic kidney disease, stage 2 (mild): Secondary | ICD-10-CM | POA: Diagnosis not present

## 2023-11-10 DIAGNOSIS — K5710 Diverticulosis of small intestine without perforation or abscess without bleeding: Secondary | ICD-10-CM

## 2023-11-10 DIAGNOSIS — K648 Other hemorrhoids: Secondary | ICD-10-CM

## 2023-11-10 DIAGNOSIS — D649 Anemia, unspecified: Secondary | ICD-10-CM | POA: Diagnosis not present

## 2023-11-10 DIAGNOSIS — J9601 Acute respiratory failure with hypoxia: Secondary | ICD-10-CM | POA: Diagnosis not present

## 2023-11-10 DIAGNOSIS — I130 Hypertensive heart and chronic kidney disease with heart failure and stage 1 through stage 4 chronic kidney disease, or unspecified chronic kidney disease: Secondary | ICD-10-CM

## 2023-11-10 HISTORY — PX: BIOPSY OF SKIN SUBCUTANEOUS TISSUE AND/OR MUCOUS MEMBRANE: SHX6741

## 2023-11-10 HISTORY — PX: COLONOSCOPY: SHX5424

## 2023-11-10 HISTORY — PX: POLYPECTOMY: SHX149

## 2023-11-10 HISTORY — PX: HOT HEMOSTASIS: SHX5433

## 2023-11-10 HISTORY — PX: ESOPHAGOGASTRODUODENOSCOPY: SHX5428

## 2023-11-10 LAB — GLUCOSE, CAPILLARY
Glucose-Capillary: 130 mg/dL — ABNORMAL HIGH (ref 70–99)
Glucose-Capillary: 110 mg/dL — ABNORMAL HIGH (ref 70–99)
Glucose-Capillary: 127 mg/dL — ABNORMAL HIGH (ref 70–99)
Glucose-Capillary: 178 mg/dL — ABNORMAL HIGH (ref 70–99)

## 2023-11-10 LAB — BASIC METABOLIC PANEL WITH GFR
Anion gap: 8 (ref 5–15)
BUN: 6 mg/dL — ABNORMAL LOW (ref 8–23)
CO2: 25 mmol/L (ref 22–32)
Calcium: 9.1 mg/dL (ref 8.9–10.3)
Chloride: 108 mmol/L (ref 98–111)
Creatinine, Ser: 0.85 mg/dL (ref 0.44–1.00)
GFR, Estimated: >60 mL/min (ref >60)
Glucose, Bld: 114 mg/dL — ABNORMAL HIGH (ref 70–99)
Potassium: 4.1 mmol/L (ref 3.5–5.1)
Sodium: 141 mmol/L (ref 135–145)

## 2023-11-10 LAB — CBC
HCT: 33.4 % — ABNORMAL LOW (ref 36.0–46.0)
Hemoglobin: 10.4 g/dL — ABNORMAL LOW (ref 12.0–15.0)
MCH: 31 pg (ref 26.0–34.0)
MCHC: 31.1 g/dL (ref 30.0–36.0)
MCV: 99.4 fL (ref 80.0–100.0)
Platelets: 231 10*3/uL (ref 150–400)
RBC: 3.36 MIL/uL — ABNORMAL LOW (ref 3.87–5.11)
RDW: 18.8 % — ABNORMAL HIGH (ref 11.5–15.5)
WBC: 4.9 10*3/uL (ref 4.0–10.5)
nRBC: 0 % (ref 0.0–0.2)

## 2023-11-10 SURGERY — COLONOSCOPY
Anesthesia: Monitor Anesthesia Care

## 2023-11-10 MED ORDER — PROPOFOL 10 MG/ML IV BOLUS
INTRAVENOUS | Status: DC | PRN
  Administered 2023-11-10: 20 mg via INTRAVENOUS
  Administered 2023-11-10: 30 ug/kg/min via INTRAVENOUS
  Administered 2023-11-10: 20 mg via INTRAVENOUS
  Administered 2023-11-10: 30 mg via INTRAVENOUS

## 2023-11-10 MED ORDER — LIDOCAINE 2% (20 MG/ML) 5 ML SYRINGE
INTRAMUSCULAR | Status: DC | PRN
  Administered 2023-11-10: 40 mg via INTRAVENOUS

## 2023-11-10 MED ORDER — INSULIN GLARGINE-YFGN 100 UNIT/ML ~~LOC~~ SOLN
7.0000 [IU] | Freq: Every day | SUBCUTANEOUS | Status: DC
  Administered 2023-11-10 – 2023-11-11 (×2): 7 [IU] via SUBCUTANEOUS
  Filled 2023-11-10 (×2): qty 0.07

## 2023-11-10 MED ORDER — NYSTATIN 100000 UNIT/ML MT SUSP
5.0000 mL | Freq: Four times a day (QID) | OROMUCOSAL | Status: DC
  Administered 2023-11-10 – 2023-11-11 (×3): 500000 [IU] via ORAL
  Filled 2023-11-10 (×3): qty 5

## 2023-11-10 MED ORDER — POLYETHYLENE GLYCOL 3350 17 GM/SCOOP PO POWD
119.0000 g | Freq: Once | ORAL | Status: AC
  Administered 2023-11-10: 119 g via ORAL
  Filled 2023-11-10: qty 119

## 2023-11-10 MED ORDER — ALBUMIN HUMAN 25 % IV SOLN
25.0000 g | Freq: Once | INTRAVENOUS | Status: AC
  Administered 2023-11-10: 25 g via INTRAVENOUS
  Filled 2023-11-10: qty 100

## 2023-11-10 MED ORDER — METOPROLOL SUCCINATE ER 25 MG PO TB24
25.0000 mg | ORAL_TABLET | Freq: Every day | ORAL | Status: DC
  Administered 2023-11-11: 25 mg via ORAL
  Filled 2023-11-10: qty 1

## 2023-11-10 MED ORDER — FLUCONAZOLE 200 MG PO TABS
200.0000 mg | ORAL_TABLET | Freq: Every day | ORAL | Status: DC
  Administered 2023-11-11: 200 mg via ORAL
  Filled 2023-11-10: qty 1

## 2023-11-10 MED ORDER — FLUCONAZOLE IN SODIUM CHLORIDE 400-0.9 MG/200ML-% IV SOLN
400.0000 mg | Freq: Once | INTRAVENOUS | Status: AC
  Administered 2023-11-10: 400 mg via INTRAVENOUS
  Filled 2023-11-10: qty 200

## 2023-11-10 MED ORDER — PHENYLEPHRINE 80 MCG/ML (10ML) SYRINGE FOR IV PUSH (FOR BLOOD PRESSURE SUPPORT)
PREFILLED_SYRINGE | INTRAVENOUS | Status: DC | PRN
  Administered 2023-11-10: 160 ug via INTRAVENOUS
  Administered 2023-11-10: 80 ug via INTRAVENOUS
  Administered 2023-11-10: 160 ug via INTRAVENOUS
  Administered 2023-11-10: 80 ug via INTRAVENOUS
  Administered 2023-11-10 (×3): 160 ug via INTRAVENOUS

## 2023-11-10 MED ORDER — SODIUM CHLORIDE 0.9 % IV SOLN: INTRAVENOUS | Status: AC

## 2023-11-10 MED ORDER — PHENYLEPHRINE HCL-NACL 20-0.9 MG/250ML-% IV SOLN: INTRAVENOUS | Status: DC | PRN

## 2023-11-10 NOTE — Op Note (Signed)
 [facility] Patient Name: Anita Mccormick Procedure Date : 11/10/2023 MRN: [id number] Attending MD: Particia Lather , , [id number] Date of Birth: 09-01-1945 CSN: [id number] Age: 78 Admit Type: Inpatient Procedure:                Colonoscopy Indications:              Iron deficiency anemia Providers:                Madelyn Brunner" Mayer Camel, Technician,                            Jacquelyn "Jaci" Clelia Croft, RN Referring MD:             Hospitalist team Medicines:                Monitored Anesthesia Care Complications:            No immediate complications. Estimated Blood Loss:     Estimated blood loss was minimal. Procedure:                Pre-Anesthesia Assessment:                           - Prior to the procedure, a History and Physical                            was performed, and patient medications and                            allergies were reviewed. The patient's tolerance of                            previous anesthesia was also reviewed. The risks                            and benefits of the procedure and the sedation                            options and risks were discussed with the patient.                            All questions were answered, and informed consent                            was obtained. Prior Anticoagulants: The patient has                            taken Plavix (clopidogrel), last dose was 4 days                            prior to procedure. ASA Grade Assessment: III - A                            patient with severe systemic disease. After  reviewing the risks and benefits, the patient was                            deemed in satisfactory condition to undergo the                            procedure.                           After obtaining informed consent, the colonoscope                            was passed under direct vision. Throughout the                            procedure, the  patient's blood pressure, pulse, and                            oxygen saturations were monitored continuously. The                            [id number]-HQ190L ([id number]) Olympus colonoscope was                            introduced through the anus and advanced to the the                            terminal ileum. The colonoscopy was performed                            without difficulty. The patient tolerated the                            procedure well. The quality of the bowel                            preparation was adequate. The terminal ileum,                            ileocecal valve, appendiceal orifice, and rectum                            were photographed. Scope In: 2:56:20 PM Scope Out: 3:23:52 PM Scope Withdrawal Time: 0 hours 21 minutes 37 seconds  Total Procedure Duration: 0 hours 27 minutes 32 seconds  Findings:      The terminal ileum appeared normal.      Two sessile polyps were found in the transverse colon. The polyps were 3       to 12 mm in size. These polyps were removed with a cold snare. Resection       and retrieval were complete.      Non-bleeding internal hemorrhoids were found during retroflexion. Impression:               - The examined portion of the ileum was normal.                           -  Two 3 to 12 mm polyps in the transverse colon,                            removed with a cold snare. Resected and retrieved.                           - Non-bleeding internal hemorrhoids. Recommendation:           - Return patient to hospital ward for ongoing care.                           - Patient's IDA may have been related to                            gastric/small bowel angioectasias or gastritis.                           - Await pathology results.                           - Okay to restart Plavix tomorrow.                           - The findings and recommendations were discussed                            with the patient. Procedure Code(s):        ---  Professional ---                           [id number], Colonoscopy, flexible; with removal of                            tumor(s), polyp(s), or other lesion(s) by snare                            technique Diagnosis Code(s):        --- Professional ---                           K64.8, Other hemorrhoids                           D12.3, Benign neoplasm of transverse colon (hepatic                            flexure or splenic flexure)                           D50.9, Iron deficiency anemia, unspecified CPT copyright 2022 American Medical Association. All rights reserved. The codes documented in this report are preliminary and upon coder review may  be revised to meet current compliance requirements. Dr Particia Lather "Alan Ripper" Leonides Schanz,  11/10/2023 3:38:48 PM Number of Addenda: 0

## 2023-11-10 NOTE — Op Note (Addendum)
 [facility] Patient Name: Anita Mccormick Procedure Date : 11/10/2023 MRN: [id number] Attending MD: Particia Lather , , [id number] Date of Birth: 21-Feb-1946 CSN: [id number] Age: 78 Admit Type: Inpatient Procedure:                Upper GI endoscopy Indications:              Iron deficiency anemia Providers:                Madelyn Brunner" Mayer Camel, Technician,                            [place] "Jaci" Clelia Croft, RN, Ethelda Chick, CRNA, Referring MD:             Hospitalist team Medicines:                Monitored Anesthesia Care Complications:            No immediate complications. Estimated Blood Loss:     Estimated blood loss was minimal. Procedure:                Pre-Anesthesia Assessment:                           - Prior to the procedure, a History and Physical                            was performed, and patient medications and                            allergies were reviewed. The patient's tolerance of                            previous anesthesia was also reviewed. The risks                            and benefits of the procedure and the sedation                            options and risks were discussed with the patient.                            All questions were answered, and informed consent                            was obtained. Prior Anticoagulants: The patient has                            taken Plavix (clopidogrel), last dose was 4 days                            prior to procedure. After reviewing the risks and                            benefits, the patient was deemed in satisfactory  condition to undergo the procedure.                           After obtaining informed consent, the endoscope was                            passed under direct vision. Throughout the                            procedure, the patient's blood pressure, pulse, and                            oxygen saturations were monitored  continuously. The                            [id number]-[id number] ([id number]) Olympus endoscope was introduced                            through the mouth, and advanced to the third part                            of duodenum. The upper GI endoscopy was                            accomplished without difficulty. The patient                            tolerated the procedure well. Scope In: Scope Out: Findings:      White nummular lesions were noted in the upper third of the esophagus.       Biopsies were taken with a cold forceps for histology.      Four 3 to 6 mm angioectasias with no bleeding were found in the gastric       body. Coagulation for bleeding prevention using argon plasma at 2       liters/minute and 20 watts was successful.      Localized inflammation characterized by congestion (edema), erosions and       erythema was found in the gastric antrum. Biopsies were taken with a       cold forceps for histology.      A single large angioectasia without bleeding was found in the duodenal       bulb. Coagulation for bleeding prevention using argon plasma at 1       liter/minute and 20 watts was successful.      There was a small lipoma in the second portion of the duodenum.      A medium non-bleeding diverticulum was found in the third portion of the       duodenum. Impression:               - White nummular lesions in esophageal mucosa.                            Biopsied.                           - Four non-bleeding angioectasias in the stomach.  Treated with argon plasma coagulation (APC).                           - Gastritis. Biopsied.                           - A single non-bleeding angioectasia in the                            duodenum. Treated with argon plasma coagulation                            (APC).                           - Duodenal lipoma.                           - Non-bleeding duodenal diverticulum. Recommendation:           - Await  pathology results.                           - Continue PPI BID for at least 8 weeks, then QD.                           - Perform a colonoscopy today. Procedure Code(s):        --- Professional ---                           [phone], 59, Esophagogastroduodenoscopy, flexible,                            transoral; with control of bleeding, any method                           43239, Esophagogastroduodenoscopy, flexible,                            transoral; with biopsy, single or multiple Diagnosis Code(s):        --- Professional ---                           K22.89, Other specified disease of esophagus                           K31.819, Angiodysplasia of stomach and duodenum                            without bleeding                           K29.70, Gastritis, unspecified, without bleeding                           D50.9, Iron deficiency anemia, unspecified CPT copyright 2022 American Medical Association. All rights reserved. The codes documented in this report are preliminary and upon coder review may  be revised to meet current compliance requirements. Dr Pedro Bourgeois "Anastacio Balm" Rosaline Coma,  11/10/2023 3:35:29 PM Number of Addenda: 0

## 2023-11-10 NOTE — Consult Note (Addendum)
 WOC Nurse Consult Note: Refer to previous WOC progress notes. Unstageable pressure injuries to sacrum and bilat buttocks are almost completely healed. Middle sacrum with intact skin.  Bilat buttocks with pink dry scar tissue .2X.2X.1cm. Plan: Discontinue Medihoney and continue foam dressing to protect from further injury. Discussed plan of care with Pt and daughter at the bedside, she assessed wound appearance.  Please re-consult if further assistance is needed.  Thank-you,  Wiliam Harder MSN, RN, CWOCN, [place], CNS [phone]

## 2023-11-10 NOTE — Progress Notes (Signed)
 Occupational Therapy Treatment Patient Details Name: Anita Mccormick MRN: [id number] DOB: [birth year] Today's Date: 11/10/2023   History of present illness Pt is 78 yo presenting to [facility] ED on 3/18 due to worsening SOB, orthopnea and wheezing with residual cough since COVID. In ED, pt with syncopal event and later PEA bradycardic/asystolic arrest with ROSC after 3 rounds of ACLS. 3/18-3/23 ICU stay. 3/18-3/22 intubation. 3/25 cardiac catheterization. PMH: HTN, HLD, CVA, DM, CKD II, HFEF, anemia, breast cancer, NAFLD/cirrhosis and COVID 08/2023   OT comments  Pt making progress with functional goals, pt's husband present and supportive. Pt participated in sitting EOB for UB and LB ADL tasks, functional mobility using RW to walk to bathroom for toilet transfers and toileting tasks; pt also stood at sink for grooming and hygiene tasks. Pt required verbal cues for safety/correct hand placement using RW. OT will continue to follow acutely to maximize level of function and safety      If plan is discharge home, recommend the following:  A little help with walking and/or transfers;A lot of help with bathing/dressing/bathroom;Assistance with cooking/housework;Assistance with feeding;Assist for transportation;Help with stairs or ramp for entrance   Equipment Recommendations  None recommended by OT    Recommendations for Other Services      Precautions / Restrictions Precautions Precautions: Fall Recall of Precautions/Restrictions: Intact Restrictions Weight Bearing Restrictions Per Provider Order: No       Mobility Bed Mobility Overal bed mobility: Needs Assistance Bed Mobility: Supine to Sit     Supine to sit: Supervision, HOB elevated, Used rails          Transfers Overall transfer level: Needs assistance Equipment used: Rolling walker (2 wheels) Transfers: Sit to/from Stand Sit to Stand: Supervision           General transfer comment: cues for correct hand placement using RW      Balance Overall balance assessment: Needs assistance Sitting-balance support: Feet supported, No upper extremity supported Sitting balance-Leahy Scale: Fair     Standing balance support: Reliant on assistive device for balance, No upper extremity supported, During functional activity Standing balance-Leahy Scale: Fair                             ADL either performed or assessed with clinical judgement   ADL Overall ADL's : Needs assistance/impaired     Grooming: Wash/dry hands;Wash/dry face;Oral care;Standing   Upper Body Bathing: Supervision/ safety;Sitting Upper Body Bathing Details (indicate cue type and reason): simulated seated EOB Lower Body Bathing: Minimal assistance;Sit to/from stand Lower Body Bathing Details (indicate cue type and reason): simulated     Lower Body Dressing: Supervision/safety;Sitting/lateral leans Lower Body Dressing Details (indicate cue type and reason): socks and slippers Toilet Transfer: Contact guard assist;Supervision/safety;Cueing for safety;Ambulation;Rolling walker (2 wheels);Regular Toilet;Grab bars   Toileting- Clothing Manipulation and Hygiene: Minimal assistance;Sit to/from stand       Functional mobility during ADLs: Contact guard assist;Supervision/safety;Rolling walker (2 wheels);Cueing for safety      Extremity/Trunk Assessment Upper Extremity Assessment Upper Extremity Assessment: Generalized weakness   Lower Extremity Assessment Lower Extremity Assessment: Defer to PT evaluation   Cervical / Trunk Assessment Cervical / Trunk Assessment: Normal    Vision Ability to See in Adequate Light: 0 Adequate Patient Visual Report: No change from baseline     Perception     Praxis     Communication Communication Communication: No apparent difficulties   Cognition Arousal: Alert Behavior During Therapy: [facility]  for tasks assessed/performed Cognition: No apparent impairments                                Following commands: Intact        Cueing   Cueing Techniques: Verbal cues  Exercises      Shoulder Instructions       General Comments      Pertinent Vitals/ Pain       Pain Assessment Pain Assessment: Faces Faces Pain Scale: Hurts little more Pain Location: "back side" Pain Descriptors / Indicators: Grimacing, Sore Pain Intervention(s): Monitored during session, Repositioned  Home Living                                          Prior Functioning/Environment              Frequency  Min 2X/week        Progress Toward Goals  OT Goals(current goals can now be found in the care plan section)  Progress towards OT goals: Progressing toward goals     Plan      Co-evaluation                 AM-PAC OT "6 Clicks" Daily Activity     Outcome Measure   Help from another person eating meals?: None Help from another person taking care of personal grooming?: A Little Help from another person toileting, which includes using toliet, bedpan, or urinal?: A Little Help from another person bathing (including washing, rinsing, drying)?: A Little Help from another person to put on and taking off regular upper body clothing?: A Little Help from another person to put on and taking off regular lower body clothing?: A Little 6 Click Score: 19    End of Session Equipment Utilized During Treatment: Rolling walker (2 wheels);Gait belt  OT Visit Diagnosis: Unsteadiness on feet (R26.81);Other abnormalities of gait and mobility (R26.89);Muscle weakness (generalized) (M62.81);Other symptoms and signs involving cognitive function   Activity Tolerance Patient tolerated treatment well   Patient Left in chair;with call bell/phone within reach;with family/visitor present   Nurse Communication          Time: 6213-0865 OT Time Calculation (min): 28 min  Charges: OT General Charges $OT Visit: 1 Visit OT Treatments $Self Care/Home Management : 8-22  mins $Therapeutic Activity: 8-22 mins   Alfred Ann 11/10/2023, 12:30 PM

## 2023-11-10 NOTE — Progress Notes (Addendum)
 PROGRESS NOTE    TU SHIMMEL  ZOX:[id number] DOB: 07/24/1946 DOA: 10/13/2023 PCP: Deeann Saint, MD    Chief Complaint  Patient presents with   Shortness of Breath   Cough   Cardiac Arrest    Brief Narrative:  78 year old female with history of tobacco abuse, hypertension, hyperlipidemia, unspecified CVA, diabetes mellitus type 2, CKD stage II, chronic diastolic heart failure, anemia, breast cancer, NAFLD/cirrhosis and COVID-19 infection in 08/2023 presented with worsening shortness of breath. On presentation, she required supplemental oxygen and was found to be influenza A positive; chest x-ray was negative for acute infiltrates. She was started on Tamiflu. Subsequently, had a syncopal event and was found to be more hypoxic. Later on, she was found to be in PEA bradycardic/asystolic arrest with ROSC after 3 rounds of ACLS. She was subsequently intubated and transferred to ICU. PCCM and cardiology were consulted. She was then found to have left pneumothorax for which pigtail catheter was placed. She also needed vasopressors for shock along with IV antibiotics. Troponins trended to more than 24,000. She was started on heparin drip. Echo showed EF of 60 to 65% with no valvular issues. She was also started on IV Solu-Medrol by PCCM. Subsequently, vasopressors were stopped. She was extubated on 10/17/2023.Chest tube was subsequently removed. She was transferred to [facility] service from 10/18/2023 onwards. Now doing great, ready for DC.  Patient noted to become anemic, FOBT obtained positive.  GI consulted and patient for EGD colonoscopy today, 11/10/2023.     Assessment & Plan:   Principal Problem:   Acute respiratory failure with hypoxia (HCC) Active Problems:   Type II diabetes mellitus with renal manifestations (HCC)   Hyperlipidemia   Essential hypertension   History of CVA (cerebrovascular accident)   History of breast cancer   Fatty liver disease, nonalcoholic   Chronic kidney disease  (CKD), stage II (mild)   Chronic diastolic CHF (congestive heart failure) (HCC)   Anemia   Cirrhosis of liver without ascites (HCC)   Influenza A   Cardiac arrest (HCC)   Pneumothorax on left   Malnutrition of moderate degree   Non-ST elevation (NSTEMI) myocardial infarction (HCC)   Heme positive stool  #1 PEA/cardiac arrest/CAD/non-STEMI/chronic diastolic heart failure/probable cardiogenic shock resolved -Patient with ROSC after 3 rounds of CPR. -Patient required ICU care with intubation and vasopressor support and broad-spectrum antibiotics. -Patient subsequently extubated, weaned off pressors and transferred to [facility] service on 10/18/2023. -Troponins noted to be elevated > 24,000. -Patient noted to have received a heparin drip. -Patient seen in consultation by cardiology, underwent cardiac catheterization 10/20/2023 and triple-vessel disease noted but not amenable to PCI. -It was felt per cardiology that patient was not a candidate for bypass surgery and medical management recommended. -Continue aspirin, statin. -BP was soft and as such Norvasc discontinued.   - Patient with low diastolic blood pressure and as such we will hold Toprol-XL today.   -Cardiology was following but has signed off. -Outpatient follow-up with cardiology.  2.  Acute respiratory failure with hypoxia secondary to Influenza A  pneumonia/COPD exacerbation -Status post 5 days Tamiflu and steroids and IV Rocephin. -Improved clinically and weaned off oxygen currently with sats 100% on room air. -Status post 7 days doxycycline with improvement with leukocytosis.   -Patient also received trial of IV Lasix.  3.  Mildly abnormal LFTs -Likely related to cardiac arrest.  4.  Pneumothorax -Status post pigtail catheter placement which has subsequently been removed. -Patient not hypoxic.  5.  Hypertension -BP  was soft but improved.  -Patient with low diastolic blood pressure this morning however  asymptomatic. -Amlodipine discontinued. -Hold Toprol-XL today. -Status post transfusion 2 units PRBCs. -CBC pending. -IV albumin x 1.  6.  Syncope -2D echo and cardiac catheterization as noted above. -Patient with no neurological deficits. -No significant abnormalities noted on telemetry. -Workup completed.  7.  Thrombocytopenia -Platelet count fluctuating and has improved currently at 226 (11/09/2023). - A.m. labs pending.    - May need outpatient follow-up with hematology if persistent on follow-up with PCP.   8.  History of unspecified CVA/hyperlipidemia -No focal neurological deficits. -Continue aspirin and statin for secondary stroke prophylaxis.   9.  Macrocytic anemia/possible iron deficiency anemia -Status post IV iron. -Vitamin B12, folate normal. -Continue oral iron supplementation. -Patient does endorse some melanotic stools. -Hemoglobin currently at 10.2 on 11/09/2023 ( posttransfusion 2 units PRBCs) from down to 7.1 from 8.3 on 10/31/2023 from 11.2 on admission. -A.m. labs pending. -FOBT positive. -Continue to hold Plavix. -Discontinue heparin prophylaxis. - Currently n.p.o. in anticipation of EGD/colonoscopy today. -Continue IV PPI every 12 hours. -Patient seen in consultation by GI who are recommending EGD and colonoscopy to be done today. -Appreciate GI input and recommendations.  10.  Well-controlled diabetes mellitus type 2 with hyperglycemia -Hemoglobin A1c 5.0. -CBG 130 this morning. -Patient currently n.p.o. in anticipation of EGD/colonoscopy and as such we will decrease Semglee to 7 units daily.  - Once patient placed back on her diet could increase Semglee back to 14 units daily. - Resume meal coverage NovoLog 8 units 3 times daily with meals once back on a diet.   - SSI.  11.  NAFLD/cirrhosis -Compensated. -Outpatient follow-up with GI.  12.  History of breast cancer -Outpatient follow-up with oncology.   13.  Moderate protein calorie  malnutrition - Continue nutritional supplementation.   14.  Debility -PT/OT recommending SNF placement early on in the hospitalization and patient improving may be able to go home with home health..  15. Pressure injury Pressure Injury 11/02/23 Buttocks Right;Proximal;Left Stage 2 -  Partial thickness loss of dermis presenting as a shallow open injury with a red, pink wound bed without slough. (Active)  11/02/23 0720  Location: Buttocks  Location Orientation: Right;Proximal;Left  Staging: Stage 2 -  Partial thickness loss of dermis presenting as a shallow open injury with a red, pink wound bed without slough.  Wound Description (Comments):   Present on Admission: No     16.  Goals of care -Initially pursuing short-term rehab, however insurance authorization with peer-to-peer was denied secondary to patient being able to ambulate 75 feet with CGA.  Patient was to be discharged home with home health however discussion with the patient's daughter stating that the patient is unable to care for herself at home safely and is requesting an expedited appeal for SNF.   The daughter is very concerned about the wellbeing of her mother and likely unsafe to be home without extra aid.  Patient lives with her husband who is also frail and unable to help.  She is adamant about pursuing short-term rehab.  Working closely with case management on available options and would hold off on discharging today given the concern for unsafe disposition.   Dr Macarthur Savory spoke with dauhgter, still awaiting decision from insurance appeal.  Dr Macarthur Savory discussed possibility of going home if the appeal is denied versus paying out-of-pocket for skilled care or aide at home.  Patient improving in terms of strength will likely end up  going home with home health once medically stable and cleared by GI.   DVT prophylaxis: Heparin>>>> SCDs Code Status: Full Family Communication: Updated patient, daughter, husband at  bedside.. Disposition: Awaiting appeal process from denial to SNF.  Likely home with home health once cleared by GI.  Status is: Inpatient Remains inpatient appropriate because: Disposition   Consultants:  PCCM Cardiology: Dr.Thukkani 10/14/2023 Gastroenterology: Dr. Dominic Friendly III 11/07/2023  Procedures: CT head 10/15/2023 2D echo 10/14/2023 Cardiac catheterization 10/20/2023 Chest tube placement, chest tube removal 11/16/23 Transfused 2 units PRBCs 11/07/2023 PICC line placement 11/07/2023  Significant Hospital Events: Including procedures, antibiotic start and stop dates in addition to other pertinent events   3/18 admit to [facility] Flu PNA. Syncope. Coded. Tube after. Admit ICU  November 16, 2023 passed SBT and extubated.  Antimicrobials:  Anti-infectives (From admission, onward)    Start     Dose/Rate Route Frequency Ordered Stop   10/27/23 0000  doxycycline (VIBRA-TABS) 100 MG tablet        100 mg Oral Every 12 hours 10/27/23 0845 10/31/23 2359   10/23/23 1515  doxycycline (VIBRA-TABS) tablet 100 mg        100 mg Oral Every 12 hours 10/23/23 1425 10/30/23 0959   2023-11-16 1000  oseltamivir (TAMIFLU) capsule 30 mg        30 mg Oral 2 times daily 16-Nov-2023 0728 10/18/23 2109   10/15/23 1000  oseltamivir (TAMIFLU) capsule 30 mg  Status:  Discontinued        30 mg Per Tube 2 times daily 10/15/23 0735 11/16/23 0729   10/14/23 1200  metroNIDAZOLE (FLAGYL) IVPB 500 mg  Status:  Discontinued        500 mg 100 mL/hr over 60 Minutes Intravenous Every 12 hours 10/13/23 2357 10/14/23 0935   10/14/23 1030  azithromycin (ZITHROMAX) 500 mg in sodium chloride 0.9 % 250 mL IVPB        500 mg 250 mL/hr over 60 Minutes Intravenous Every 24 hours 10/14/23 0935 10/16/23 1038   10/14/23 1000  oseltamivir (TAMIFLU) capsule 30 mg  Status:  Discontinued        30 mg Per Tube Daily 10/14/23 0935 10/15/23 0735   10/14/23 0000  cefTRIAXone (ROCEPHIN) 2 g in sodium chloride 0.9 % 100 mL IVPB        2 g 200 mL/hr over 30  Minutes Intravenous Every 24 hours 10/13/23 2357 2023-11-16 2339   10/13/23 2200  oseltamivir (TAMIFLU) capsule 30 mg  Status:  Discontinued        30 mg Oral 2 times daily 10/13/23 1247 10/13/23 1847   10/13/23 2015  oseltamivir (TAMIFLU) capsule 30 mg  Status:  Discontinued        30 mg Per Tube Daily 10/13/23 1847 10/14/23 0935   10/13/23 1145  oseltamivir (TAMIFLU) capsule 75 mg        75 mg Oral  Once 10/13/23 1133 10/13/23 1141         Subjective: Patient laying in bed.  Denies any shortness of breath.  Patient with some chest soreness unchanged.  States have been having dark stools last night and this morning after getting bowel prep.  States stools are not clear yet.  Complaining of pain and some bleeding around heparin injection site.  No nausea or vomiting.  Daughter and husband at bedside.  Awaiting EGD/colonoscopy.    Objective: Vitals:   11/09/23 2031 11/10/23 0049 11/10/23 0532 11/10/23 0826  BP: (!) 134/53 (!) 87/73 (!) 139/42 (!) 113/30  Pulse:  80 74 75 78  Resp: 20 15 20    Temp: 98.4 F (36.9 C) (!) 97.5 F (36.4 C) 98.4 F (36.9 C) 99.1 F (37.3 C)  TempSrc: Oral Oral Oral Oral  SpO2: 99% 99% 100% 100%  Weight:      Height:        Intake/Output Summary (Last 24 hours) at 11/10/2023 0913 Last data filed at 11/09/2023 1825 Gross per 24 hour  Intake 960 ml  Output 1375 ml  Net -415 ml   Filed Weights   11/07/23 0425 11/08/23 0605 11/09/23 0411  Weight: 60.6 kg 60.9 kg 59.5 kg    Examination:  General exam: NAD Respiratory system: CTAB.  No wheezes, no crackles, no rhonchi.  Fair air movement.  Speaking in full sentences.   Cardiovascular system: Regular rate rhythm no murmurs rubs or gallops.  No JVD.  No pitting lower extremity edema.  Gastrointestinal system: Abdomen is soft, nontender, nondistended, positive bowel sounds.  No rebound.  No guarding.  Left lower abdominal region with some bruising around heparin injection site and tender to palpation.   Central nervous system: Alert and oriented. No focal neurological deficits. Extremities: Symmetric 5 x 5 power. Skin: No rashes, lesions or ulcers Psychiatry: Judgement and insight appear normal. Mood & affect appropriate.     Data Reviewed: I have personally reviewed following labs and imaging studies  CBC: Recent Labs  Lab 11/06/23 1240 11/08/23 0522 11/08/23 1643 11/09/23 0542  WBC 6.7 5.7  --  5.3  HGB 7.1* 9.9* 9.8* 10.2*  HCT 23.0* 31.1* 31.1* 32.3*  MCV 111.7* 97.5  --  98.8  PLT 298 241  --  226    Basic Metabolic Panel: Recent Labs  Lab 11/06/23 1108 11/08/23 0522 11/09/23 0542  NA 137 139 139  K 4.5 4.1 4.4  CL 104 106 106  CO2 26 25 26   GLUCOSE 251* 186* 184*  BUN 14 8 7*  CREATININE 0.95 0.83 0.84  CALCIUM 8.9 8.6* 8.8*  MG  --  2.0  --   PHOS 2.7  --   --     GFR: Estimated Creatinine Clearance: 44.4 mL/min (by C-G formula based on SCr of 0.84 mg/dL).  Liver Function Tests: Recent Labs  Lab 11/06/23 1108  ALBUMIN 2.6*    CBG: Recent Labs  Lab 11/09/23 0650 11/09/23 1058 11/09/23 1554 11/09/23 2139 11/10/23 0550  GLUCAP 192* 183* 95 134* 130*     No results found for this or any previous visit (from the past 240 hours).       Radiology Studies: No results found.       Scheduled Meds:  aspirin EC  81 mg Oral Daily   atorvastatin  80 mg Oral Daily   Chlorhexidine Gluconate Cloth  6 each Topical Daily   feeding supplement  1 Container Oral TID BM   insulin aspart  0-15 Units Subcutaneous TID WC   insulin aspart  0-5 Units Subcutaneous QHS   insulin aspart  8 Units Subcutaneous TID WC   insulin glargine-yfgn  7 Units Subcutaneous Daily   methocarbamol  500 mg Oral Once   metoprolol succinate  25 mg Oral Daily   mometasone-formoterol  2 puff Inhalation BID   multivitamin with minerals  1 tablet Oral Daily   nutrition supplement (JUVEN)  1 packet Oral BID BM   pantoprazole (PROTONIX) IV  40 mg Intravenous Q12H    polyethylene glycol powder  119 g Oral Once   saccharomyces boulardii  250  mg Oral BID   sodium chloride flush  10-40 mL Intracatheter Q12H   sodium chloride flush  3 mL Intravenous Q12H   Continuous Infusions:  sodium chloride     albumin human       LOS: 28 days    Time spent: 40 minutes    Hilda Lovings, MD [facility]   To contact the attending provider between 7A-7P or the covering provider during after hours 7P-7A, please log into the web site [email or web address] and access using universal [place] password for that web site. If you do not have the password, please call the hospital operator.  11/10/2023, 9:13 AM

## 2023-11-10 NOTE — Transfer of Care (Signed)
 Immediate Anesthesia Transfer of Care Note  Patient: Anita Mccormick  Procedure(s) Performed: COLONOSCOPY EGD (ESOPHAGOGASTRODUODENOSCOPY) EGD, WITH ARGON PLASMA COAGULATION BIOPSY POLYPECTOMY, INTESTINE  Patient Location: PACU and Endoscopy Unit  Anesthesia Type:MAC  Level of Consciousness: awake and alert   Airway & Oxygen Therapy: Patient Spontanous Breathing and Patient connected to nasal cannula oxygen  Post-op Assessment: Report given to RN and Post -op Vital signs reviewed and stable  Post vital signs: Reviewed and stable  Last Vitals:  Vitals Value Taken Time  BP 107/73 11/10/23 1533  Temp 36.1 C 11/10/23 1533  Pulse 84 11/10/23 1536  Resp 18 11/10/23 1536  SpO2 100 % 11/10/23 1536  Vitals shown include unfiled device data.  Last Pain:  Vitals:   11/10/23 1533  TempSrc: Temporal  PainSc: 0-No pain      Patients Stated Pain Goal: 0 (10/27/23 0735)  Complications: No notable events documented.

## 2023-11-10 NOTE — Anesthesia Preprocedure Evaluation (Addendum)
 Anesthesia Evaluation  Patient identified by MRN, date of birth, ID band Patient awake    Reviewed: Allergy & Precautions, NPO status , Patient's Chart, lab work & pertinent test results, reviewed documented beta blocker date and time   Airway Mallampati: II  TM Distance: >3 FB Neck ROM: Full    Dental no notable dental hx.    Pulmonary Current Smoker   Pulmonary exam normal        Cardiovascular hypertension, Pt. on medications and Pt. on home beta blockers + Past MI and +CHF   Rhythm:Regular Rate:Normal  TTE 2025 1. Left ventricular ejection fraction, by estimation, is 60 to 65%. The  left ventricle has normal function. The left ventricle has no regional  wall motion abnormalities. Left ventricular diastolic parameters are  indeterminate.   2. Right ventricular systolic function is normal. The right ventricular  size is normal. There is normal pulmonary artery systolic pressure. The  estimated right ventricular systolic pressure is 15.5 mmHg.   3. The mitral valve is normal in structure. Trivial mitral valve  regurgitation. No evidence of mitral stenosis. Moderate mitral annular  calcification.   4. The aortic valve is normal in structure. Aortic valve regurgitation is  not visualized. No aortic stenosis is present.   5. The inferior vena cava is normal in size with greater than 50%  respiratory variability, suggesting right atrial pressure of 3 mmHg.   Cath 2025 1. 3 vessel obstructive CAD. Suspect culprit vessel is the LCx.  2. Low LVEDP 4 mm Hg   Plan: recommend medical therapy. She is not having significant angina. She is currently a poor candidate for CABG. Poor targets for PCI.     Neuro/Psych CVA  negative psych ROS   GI/Hepatic ,GERD  ,,(+) Cirrhosis         Endo/Other  diabetes, Type 2, Oral Hypoglycemic Agents    Renal/GU Renal InsufficiencyRenal disease  negative genitourinary    Musculoskeletal negative musculoskeletal ROS (+)    Abdominal Normal abdominal exam  (+)   Peds  Hematology  (+) Blood dyscrasia, anemia Lab Results      Component                Value               Date                      WBC                      4.9                 11/10/2023                HGB                      10.4 (L)            11/10/2023                HCT                      33.4 (L)            11/10/2023                MCV                      99.4  11/10/2023                PLT                      231                 11/10/2023              Anesthesia Other Findings   Reproductive/Obstetrics                             Anesthesia Physical Anesthesia Plan  ASA: 3  Anesthesia Plan: MAC   Post-op Pain Management:    Induction: Intravenous  PONV Risk Score and Plan: Propofol infusion and Treatment may vary due to age or medical condition  Airway Management Planned: Natural Airway  Additional Equipment:   Intra-op Plan:   Post-operative Plan:   Informed Consent: I have reviewed the patients History and Physical, chart, labs and discussed the procedure including the risks, benefits and alternatives for the proposed anesthesia with the patient or authorized representative who has indicated his/her understanding and acceptance.     Dental advisory given  Plan Discussed with: CRNA  Anesthesia Plan Comments:        Anesthesia Quick Evaluation

## 2023-11-10 NOTE — Interval H&P Note (Signed)
 History and Physical Interval Note:  11/10/2023 1:39 PM  Anita Mccormick  has presented today for surgery, with the diagnosis of iron deficiency anemia, heme + stool.  The various methods of treatment have been discussed with the patient and family. After consideration of risks, benefits and other options for treatment, the patient has consented to  Procedure(s): COLONOSCOPY (N/A) EGD (ESOPHAGOGASTRODUODENOSCOPY) (N/A) as a surgical intervention.  The patient's history has been reviewed, patient examined, no change in status, stable for surgery.  I have reviewed the patient's chart and labs.  Questions were answered to the patient's satisfaction.     Alexey Rhoads C Djuana Littleton

## 2023-11-11 ENCOUNTER — Other Ambulatory Visit (HOSPITAL_COMMUNITY): Payer: Self-pay

## 2023-11-11 ENCOUNTER — Encounter (HOSPITAL_COMMUNITY): Payer: Self-pay | Admitting: Internal Medicine

## 2023-11-11 LAB — CBC
HCT: 30 % — ABNORMAL LOW (ref 36.0–46.0)
Hemoglobin: 9.3 g/dL — ABNORMAL LOW (ref 12.0–15.0)
MCH: 31.1 pg (ref 26.0–34.0)
MCHC: 31 g/dL (ref 30.0–36.0)
MCV: 100.3 fL — ABNORMAL HIGH (ref 80.0–100.0)
Platelets: 188 10*3/uL (ref 150–400)
RBC: 2.99 MIL/uL — ABNORMAL LOW (ref 3.87–5.11)
RDW: 18.2 % — ABNORMAL HIGH (ref 11.5–15.5)
WBC: 5.8 10*3/uL (ref 4.0–10.5)
nRBC: 0 % (ref 0.0–0.2)

## 2023-11-11 LAB — BASIC METABOLIC PANEL WITH GFR
Anion gap: 6 (ref 5–15)
BUN: 8 mg/dL (ref 8–23)
CO2: 25 mmol/L (ref 22–32)
Calcium: 8.9 mg/dL (ref 8.9–10.3)
Chloride: 108 mmol/L (ref 98–111)
Creatinine, Ser: 0.84 mg/dL (ref 0.44–1.00)
GFR, Estimated: >60 mL/min (ref >60)
Glucose, Bld: 197 mg/dL — ABNORMAL HIGH (ref 70–99)
Potassium: 4.1 mmol/L (ref 3.5–5.1)
Sodium: 139 mmol/L (ref 135–145)

## 2023-11-11 LAB — GLUCOSE, CAPILLARY
Glucose-Capillary: 172 mg/dL — ABNORMAL HIGH (ref 70–99)
Glucose-Capillary: 283 mg/dL — ABNORMAL HIGH (ref 70–99)

## 2023-11-11 MED ORDER — CLOPIDOGREL BISULFATE 75 MG PO TABS
75.0000 mg | ORAL_TABLET | Freq: Every day | ORAL | 2 refills | Status: DC
  Filled 2023-11-11: qty 90, 90d supply, fill #0

## 2023-11-11 MED ORDER — METOPROLOL SUCCINATE ER 25 MG PO TB24
25.0000 mg | ORAL_TABLET | Freq: Every day | ORAL | 1 refills | Status: DC
  Filled 2023-11-11: qty 90, 90d supply, fill #0

## 2023-11-11 MED ORDER — PANTOPRAZOLE SODIUM 40 MG PO TBEC
DELAYED_RELEASE_TABLET | ORAL | 0 refills | Status: DC
  Filled 2023-11-11: qty 150, 90d supply, fill #0

## 2023-11-11 MED ORDER — NITROGLYCERIN 0.4 MG SL SUBL: 0.4000 mg | SUBLINGUAL_TABLET | SUBLINGUAL | Status: DC | PRN

## 2023-11-11 MED ORDER — ALBUTEROL SULFATE HFA 108 (90 BASE) MCG/ACT IN AERS
2.0000 | INHALATION_SPRAY | Freq: Four times a day (QID) | RESPIRATORY_TRACT | 0 refills | Status: DC | PRN
  Filled 2023-11-11: qty 6.7, 25d supply, fill #0

## 2023-11-11 MED ORDER — MOMETASONE FURO-FORMOTEROL FUM 200-5 MCG/ACT IN AERO
2.0000 | INHALATION_SPRAY | Freq: Two times a day (BID) | RESPIRATORY_TRACT | 1 refills | Status: DC
  Filled 2023-11-11: qty 13, 30d supply, fill #0

## 2023-11-11 MED ORDER — IPRATROPIUM-ALBUTEROL 0.5-2.5 (3) MG/3ML IN SOLN
3.0000 mL | Freq: Four times a day (QID) | RESPIRATORY_TRACT | 0 refills | Status: DC | PRN
  Filled 2023-11-11: qty 360, 30d supply, fill #0

## 2023-11-11 MED ORDER — FLUCONAZOLE 200 MG PO TABS
200.0000 mg | ORAL_TABLET | Freq: Every day | ORAL | 0 refills | Status: AC
  Filled 2023-11-11: qty 14, 14d supply, fill #0

## 2023-11-11 NOTE — Progress Notes (Signed)
 Physical Therapy Treatment Patient Details Name: Anita Mccormick MRN: [id number] DOB: 1945-11-20 Today's Date: 11/11/2023   History of Present Illness Pt is 78 yo presenting to [facility] ED on 3/18 due to worsening SOB, orthopnea and wheezing with residual cough since COVID. In ED, pt with syncopal event and later PEA bradycardic/asystolic arrest with ROSC after 3 rounds of ACLS. 3/18-3/23 ICU stay. 3/18-3/22 intubation. 3/25 cardiac catheterization. PMH: HTN, HLD, CVA, DM, CKD II, HFEF, anemia, breast cancer, NAFLD/cirrhosis and COVID 08/2023    PT Comments  Pt is making steady progress towards her acute PT goals. Introduced Occupational hygienist for [employer] with functional mobility. Educated pt on proper technique, safe use, and features of rollator. Pt verbalized understanding and demonstrated good sequencing and safety awareness with intermittent VC. She required supervision for safety with OOB mobility. Instructed pt on stair training. She ascended/descended 3 steps using BHR. Recommend 2+ assist with steps to have HHA on either side and increase her confidence as she reported fear of falling. Pt will benefit from HHPT to increase her strength, improve endurance, decrease fall risk, and maximize her independence and safety with mobility.     If plan is discharge home, recommend the following: A little help with walking and/or transfers;Assist for transportation;Help with stairs or ramp for entrance;Assistance with cooking/housework;A little help with bathing/dressing/bathroom   Can travel by private vehicle     Yes  Equipment Recommendations  Rollator (4 wheels);BSC/3in1    Recommendations for Other Services       Precautions / Restrictions Precautions Precautions: Fall Recall of Precautions/Restrictions: Intact Restrictions Weight Bearing Restrictions Per Provider Order: No     Mobility  Bed Mobility               General bed mobility comments: Not assessed. Pt greeted seated in  recliner chair and returned there at end of session.    Transfers Overall transfer level: Needs assistance Equipment used: Rollator (4 wheels), None Transfers: Sit to/from Stand, Bed to chair/wheelchair/BSC Sit to Stand: Supervision   Step pivot transfers: Supervision       General transfer comment: VC for proper sequencing and locking/unlocking breaks using rollator. Pt powered up without physical assistance. Good eccentric control with sitting. Pt transferred to/from [facility] without an AD and supervision for safety. She demonstrated good technique and no LOB.    Ambulation/Gait Ambulation/Gait assistance: Supervision Gait Distance (Feet): 150 Feet (1x150, seated rest, 1x100, seated rest, 1x50) Assistive device: Rollator (4 wheels) Gait Pattern/deviations: Step-through pattern, Decreased step length - right, Decreased step length - left, Decreased stride length, Trunk flexed, Wide base of support, Shuffle Gait velocity: reduced Gait velocity interpretation: <1.8 ft/sec, indicate of risk for recurrent falls   General Gait Details: Pt ambulated with short, small steps. She alternated between shuffling and achieving adequate foot clearence and foot flat contact. Pt maintained a slightly fwd flex posture, but kept AD close to her body. VC to scan the environment ahead of her as pt would catch a rollator wheel on objects in the hallway. PT had to facilitate seated rest breaks as pt was trying to push through and gait mechanics were worsening as she fatigued.   Stairs Stairs: Yes Stairs assistance: Contact guard assist Stair Management: Two rails, Step to pattern, Forwards Number of Stairs: 3 General stair comments: Pt ascended leading with RLE and descended leading with LLE. She doesn't have any railings at home. Educated her on how her family should be positioned to support her and recommend 2 people present  d/t her increased anxiety and fear of falling.   Wheelchair Mobility     Tilt  Bed    Modified Rankin (Stroke Patients Only)       Balance Overall balance assessment: Mild deficits observed, not formally tested                                          Communication Communication Communication: No apparent difficulties  Cognition Arousal: Alert Behavior During Therapy: WFL for tasks assessed/performed   PT - Cognitive impairments: No apparent impairments                         Following commands: Intact      Cueing Cueing Techniques: Verbal cues  Exercises      General Comments General comments (skin integrity, edema, etc.): HR at rest 85-95bpm, with activity HR 100s-110s. SpO2 >90% on RA.      Pertinent Vitals/Pain Pain Assessment Pain Assessment: Faces Faces Pain Scale: Hurts little more Pain Location: "back side" Pain Descriptors / Indicators: Sore, Tender, Discomfort Pain Intervention(s): Monitored during session, Repositioned    Home Living                          Prior Function            PT Goals (current goals can now be found in the care plan section) Acute Rehab PT Goals Patient Stated Goal: Return Home Progress towards PT goals: Progressing toward goals    Frequency    Min 2X/week      PT Plan      Co-evaluation              AM-PAC PT "6 Clicks" Mobility   Outcome Measure  Help needed turning from your back to your side while in a flat bed without using bedrails?: A Little Help needed moving from lying on your back to sitting on the side of a flat bed without using bedrails?: A Little Help needed moving to and from a bed to a chair (including a wheelchair)?: A Little Help needed standing up from a chair using your arms (e.g., wheelchair or bedside chair)?: A Little Help needed to walk in hospital room?: A Little Help needed climbing 3-5 steps with a railing? : A Little 6 Click Score: 18    End of Session Equipment Utilized During Treatment: Gait belt Activity  Tolerance: Patient tolerated treatment well Patient left: in chair;with call bell/phone within reach Nurse Communication: Mobility status PT Visit Diagnosis: Unsteadiness on feet (R26.81);Other abnormalities of gait and mobility (R26.89)     Time: 1478-2956 PT Time Calculation (min) (ACUTE ONLY): 32 min  Charges:    $Gait Training: 23-37 mins PT General Charges $$ ACUTE PT VISIT: 1 Visit                     Glenford Lanes, PT, DPT Acute Rehabilitation Services Office: [phone] Secure Chat Preferred  Riva Chester 11/11/2023, 12:02 PM

## 2023-11-11 NOTE — TOC Transition Note (Signed)
 Transition of Care (TOC) - Discharge Note [name] RN, BSN Transitions of Care Unit 4E- RN Case Manager See Treatment Team for direct phone # [phone] cross coverage  Patient Details  Name: Anita Mccormick MRN: [id number] Date of Birth: June 17, 1946  Transition of Care [facility]) CM/SW Contact:  Rox Cope, RN Phone Number: 11/11/2023, 2:13 PM   Clinical Narrative:    Pt stable for transition home today, Appeal still pending.  Orders for [facility] and DME needs placed.  CM to room to speak with pt- spouse also present- pt request call be made to daughter Anita Mccormick- [phone] who is on her way to hospital from [facility].  TC made to [place]- spoke about [facility] and DME needs- per daughter she wants to speak with pt when she gets here about coming to her home in [place] vs going to pt's home. Per pt she has RW and BSC at home, order for Rollator- pt has [facility] plan that contracts with [employer].  Questions answered that daughter had regarding medications, daughter also has questions about a test that was done, CM will ask MD to give daughter a call.  List provided for [facility] choice Per CMS guidelines from [employer] website with star ratings (copy placed in shadow chart)- daughter to call this writer when she gets here to review list with her [facility] choice and decision on where pt will be going.   Call made to [employer] for DME- rollator delivery to room  1400- received call from daughter- she has selected [facility] as her [facility] choice- for PT/OT/aide/SW. She also indicated that pt will be going to her home in [place]: Address provided: [address] [place] [address] [phone]  Daughter confirmed rollator has been delivered   Call made to [facility] liaison- for [facility] referral- referral has been accepted - liaison to coordinate with [facility] branch for services.   Daughter to transport home  Final next level of care: Home w Home Health Services Barriers to Discharge: Other (must enter comment) (Appeal  with [employer] is pending)   Patient Goals and CMS Choice Patient states their goals for this hospitalization and ongoing recovery are:: return home CMS Medicare.gov Compare Post Acute Care list provided to:: Patient Choice offered to / list presented to : Patient, Adult Children  ownership interest in [facility].provided to:: Adult Children    Discharge Placement                 Home w/ [facility]      Discharge Plan and Services Additional resources added to the After Visit Summary for     Discharge Planning Services: CM Consult Post Acute Care Choice: Home Health, Durable Medical Equipment          DME Arranged: Walker rolling with seat DME Agency: [employer] Date DME Agency Contacted: 11/11/23 Time DME Agency Contacted: 1214 Representative spoke with at DME Agency: Gladys Lamp HH Arranged: PT, OT, Nurse's Aide, Social Work [employer] Agency: [facility] Date HH Agency Contacted: 11/11/23 Time HH Agency Contacted: 1412 Representative spoke with at [facility] Agency: Imelda Man  Social Drivers of Health (SDOH) Interventions SDOH Screenings   Food Insecurity: No Food Insecurity (10/13/2023)  Housing: Low Risk  (10/13/2023)  Transportation Needs: No Transportation Needs (10/13/2023)  Utilities: Not At Risk (10/13/2023)  Alcohol Screen: Low Risk  (10/03/2022)  Depression (PHQ2-9): Low Risk  (07/06/2023)  Financial Resource Strain: Low Risk  (10/03/2022)  Physical Activity: Inactive (10/03/2022)  Social Connections: Moderately Isolated (10/13/2023)  Stress: No Stress  Concern Present (10/03/2022)  Tobacco Use: High Risk (10/13/2023)     Readmission Risk Interventions    11/11/2023   12:15 PM 10/19/2023   10:43 AM  Readmission Risk Prevention Plan  Post Dischage Appt  Complete  Medication Screening  Complete  Transportation Screening Complete Complete  Home Care Screening Complete   Medication Review (RN CM) Complete

## 2023-11-11 NOTE — Discharge Summary (Signed)
 Physician Discharge Summary  Anita Mccormick VWU:[id number] DOB: 11-28-1945 DOA: 10/13/2023  PCP: Deeann Saint, MD  Admit date: 10/13/2023 Discharge date: 11/11/23  Admitted From: Home Disposition: Home Recommendations for Outpatient Follow-up:  Outpatient follow-up with PCP and cardiology as below Check blood pressure, CMP and CBC at follow-up Please follow up on the following pending results: Pathology from gastric biopsy  Home Health: [facility] PT/OT Equipment/Devices: Rollator.  Patient has bedside commode  Discharge Condition: Stable CODE STATUS: Full code  Contact information for follow-up providers     Joylene Grapes, NP Follow up on 11/04/2023.   Specialties: Cardiology, Family Medicine Why: at 2:20pm Contact information: [address] [place] [address] [phone]         Deeann Saint, MD. Schedule an appointment as soon as possible for a visit in 1 week(s).   Specialty: Family Medicine Contact information: [address] [place] [address] [phone]         Jobe Gibbon, [facility] Follow up.   Specialty: Home Health Services Why: HHPT/OT/aide/social work arranged- they will contact you to schedule Contact information: [address] [place] [address] [phone]              Contact information for after-discharge care     Destination     [facility] Preferred SNF .   Service: Skilled Nursing Contact information: [address] [place] [address] [phone]                     Hospital course 78 year old female with history of tobacco abuse, hypertension, hyperlipidemia, unspecified CVA, diabetes mellitus type 2, CKD stage II, chronic diastolic heart failure, anemia, breast cancer, NAFLD/cirrhosis and COVID-19 infection in 08/2023 presented with worsening shortness of breath. On presentation, she required supplemental  oxygen and was found to be influenza A; Chest x-ray was negative for acute infiltrates. She was started on Tamiflu. Subsequently, had a syncopal event and was found to be more hypoxic. Later on, she was found to be in PEA bradycardic/asystolic arrest with ROSC after 3 rounds of ACLS. She was subsequently intubated and transferred to ICU. PCCM and cardiology were consulted. She was then found to have left pneumothorax for which pigtail catheter was placed. She also needed vasopressors for shock along with IV antibiotics. Troponins trended to more than 24,000. She was started on heparin drip. Echo showed EF of 60 to 65% with no valvular issues. She was also started on IV Solu-Medrol by PCCM. Subsequently, vasopressors were stopped. She was extubated on 10/17/2023.Chest tube was subsequently removed. She was transferred to [facility] service from 10/18/2023 onwards.   Now doing great, ready for DC.  Patient noted to become anemic, FOBT obtained positive.  GI consulted and patient for EGD colonoscopy today, 11/10/2023   See individual problem list below for more.   Problems addressed during this hospitalization Acute respiratory failure with hypoxia secondary to Influenza A: Resolved. Pneumonia/COPD exacerbation -Status post 5 days Tamiflu and steroids and IV Rocephin.  PEA arrest/CAD/chronic diastolic CHF/probable cardiogenic shock: Resolved -ROSC after 3 rounds of CPR.  Non-STEMI: Troponin elevated > 24,000.  LHC on 10/20/2023 with three-vessel disease not amenable to PCI.  Also deemed to be not a candidate for bypass surgery -Cardiology recommended medical management -Aspirin and Plavix resumed -Continue statin -Low-dose Toprol-XL due to soft blood pressure -Outpatient follow-up with cardiology  Macrocytic anemia/possible iron deficiency anemia with suspected  GI bleed: No overt bleeding.  Hemoccult positive.  EGD showed gastritis, nonbleeding gastric and duodenal AVM that were treated with APC.  Colonoscopy  showed polyps and nonbleeding internal hemorrhoid.  Received IV iron and 2 units of blood.  Hemoglobin remained stable. Recent Labs    10/21/23 0253 10/23/23 0251 10/24/23 0218 10/31/23 0255 11/06/23 1240 11/08/23 0522 11/08/23 1643 11/09/23 0542 11/10/23 0938 11/11/23 0410  HGB 8.6* 9.0* 8.5* 8.3* 7.1* 9.9* 9.8* 10.2* 10.4* 9.3*  -GI recommended p.o. Protonix 40 mg twice daily for 8 weeks followed by 40 mg daily -Recheck CBC at follow-up -Advised to avoid NSAID  Gastritis/gastric and duodenal AVM -Protonix as above -Advised to avoid NSAID -Pathology from gastric biopsy pending   Pneumothorax: s/p pigtail catheter placement which has subsequently been removed.   Hypotension/history of hypertension: Soft blood pressure but not symptomatic.  Amlodipine and atenolol discontinued.  -Reassess blood pressure at follow-up   Syncope: Multifactorial.  TTE without significant finding.  Cardiac catheterization as above. -Medical management for non-STEMI -Adjusted cardiac meds  Thrombocytopenia: Likely due to NASH cirrhosis.  Stable. - Recheck CBC at follow-up   History of unspecified CVA/hyperlipidemia - Continue aspirin, Plavix and statin   NIDDM-2 with hyperglycemia: A1c 5.0% Recent Labs  Lab 11/10/23 1138 11/10/23 1745 11/10/23 2158 11/11/23 0607 11/11/23 1159  GLUCAP 110* 127* 178* 172* 283*  - Continue home glipizide    NAFLD/cirrhosis/elevated LFT: Compensated.  LFT normalized. -Outpatient follow-up with GI   History of breast cancer -Outpatient follow-up with oncology.    Physical deconditioning -Therapy initially recommended SNF but insurance denied.  Family appealed.  While waiting on appeal, patient's physical condition improved and she is discharged home with home health and DME.   Moderate malnutrition Nutrition Problem: Moderate Malnutrition Etiology: acute illness Signs/Symptoms: mild fat depletion, mild muscle depletion Interventions: Snacks, Boost  Breeze  Pressure skin injury Pressure Injury 11/02/23 Buttocks Right;Proximal;Left Stage 2 -  Partial thickness loss of dermis presenting as a shallow open injury with a red, pink wound bed without slough. (Active)  11/02/23 0720  Location: Buttocks  Location Orientation: Right;Proximal;Left  Staging: Stage 2 -  Partial thickness loss of dermis presenting as a shallow open injury with a red, pink wound bed without slough.  Wound Description (Comments):   Present on Admission: No  Dressing Type Foam - Lift dressing to assess site every shift 11/11/23 0815    Time spent 35 minutes  Vital signs Vitals:   11/10/23 2011 11/11/23 0029 11/11/23 0530 11/11/23 0836  BP:  (!) 108/48 (!) 116/47 137/76  Pulse: 87 85 87   Temp:  99.2 F (37.3 C) 99.1 F (37.3 C) 98.9 F (37.2 C)  Resp: 17 20 18    Height:      Weight:   60.5 kg   SpO2: 100% 100% 97% 98%  TempSrc:  Oral Oral Oral  BMI (Calculated):   24.39      Discharge exam  GENERAL: No apparent distress.  Nontoxic. HEENT: MMM.  Vision and hearing grossly intact.  NECK: Supple.  No apparent JVD.  RESP:  No IWOB.  Fair aeration bilaterally. CVS:  RRR. Heart sounds normal.  ABD/GI/GU: BS+. Abd soft, NTND.  MSK/EXT:  Moves extremities. No apparent deformity. No edema.  SKIN: no apparent skin lesion or wound NEURO: Awake and alert. Oriented appropriately.  No apparent focal neuro deficit. PSYCH: Calm. Normal affect.   Discharge Instructions Discharge Instructions     Diet Carb Modified   Complete by: As directed  Discharge instructions   Complete by: As directed    It has been a pleasure taking care of you!  You were hospitalized due to multiple medical issues including influenza, COPD exacerbation, cardiac arrest, heart attack, gastrointestinal bleed and left-sided pneumothorax for which you have been treated.  We have made changes to your medication during this hospitalization.  Please review your new medication list and the  directions on your medications before you take them.  Follow-up with your primary care doctor in 1 to 2 weeks or sooner if needed.  Follow-up with a gastroenterologist and cardiologist per their recommendation.   Take care,   Discharge wound care:   Complete by: As directed    Wound care  Daily      Comments: Foam dressing to bilat buttocks/sacrum, change Q 3 days or PRN soiling   Increase activity slowly   Complete by: As directed       Allergies as of 11/11/2023       Reactions   Bee Venom Swelling   Penicillins Hives   Ace Inhibitors Cough   ????   Chlorthalidone    REACTION: unspecified   Lactose Intolerance (gi) Diarrhea   Metformin    REACTION: gi side effects   Sulfamethoxazole    REACTION: questionable        Medication List     STOP taking these medications    atenolol 100 MG tablet Commonly known as: TENORMIN   benzonatate 100 MG capsule Commonly known as: TESSALON   naproxen sodium 220 MG tablet Commonly known as: ALEVE   Trulicity 1.5 MG/0.5ML Soaj Generic drug: Dulaglutide       TAKE these medications    acetaminophen 325 MG tablet Commonly known as: TYLENOL Take 2 tablets (650 mg total) by mouth every 6 (six) hours as needed for mild pain (pain score 1-3) (or Fever >/= 101).   albuterol 108 (90 Base) MCG/ACT inhaler Commonly known as: VENTOLIN HFA Inhale 2 puffs into the lungs every 6 (six) hours as needed for wheezing or shortness of breath.   aspirin EC 81 MG tablet Take 81 mg by mouth daily. Swallow whole.   atorvastatin 80 MG tablet Commonly known as: LIPITOR Take 1 tablet (80 mg total) by mouth daily.   blood glucose meter kit and supplies Kit Dispense based on patient and insurance preference. Use up to four times daily as directed.   clopidogrel 75 MG tablet Commonly known as: Plavix Take 1 tablet (75 mg total) by mouth daily.   Dexcom G7 Sensor Misc Apply a new sensor to skin every 10 days.   ferrous gluconate 324 MG  tablet Commonly known as: FERGON Take 1 tablet (324 mg total) by mouth daily.   fluconazole 200 MG tablet Commonly known as: DIFLUCAN Take 1 tablet (200 mg total) by mouth daily for 14 days. Start taking on: November 12, 2023   glipiZIDE 10 MG 24 hr tablet Commonly known as: GLUCOTROL XL TAKE 1 TABLET BY MOUTH TWICE A DAY   metoprolol succinate 25 MG 24 hr tablet Commonly known as: TOPROL-XL Take 1 tablet (25 mg total) by mouth daily.   mometasone-formoterol 200-5 MCG/ACT Aero Commonly known as: DULERA Inhale 2 puffs into the lungs 2 (two) times daily.   multivitamin tablet Take 1 tablet by mouth daily.   nitroGLYCERIN 0.4 MG SL tablet Commonly known as: Nitrostat Place 1 tablet (0.4 mg total) under the tongue every 5 (five) minutes as needed for chest pain.   onetouch ultrasoft  lancets Use as instructed   pantoprazole 40 MG tablet Commonly known as: Protonix Take 1 tablet (40 mg total) by mouth 2 (two) times daily for 60 days, THEN 1 tablet (40 mg total) daily. Start taking on: November 11, 2023               Discharge Care Instructions  (From admission, onward)           Start     Ordered   11/11/23 0000  Discharge wound care:       Comments: Wound care  Daily      Comments: Foam dressing to bilat buttocks/sacrum, change Q 3 days or PRN soiling   11/11/23 1118            Consultations: Critical care Cardiology Gastroenterology  Procedures/Studies: CT head 10/15/2023 2D echo 10/14/2023 Cardiac catheterization 10/20/2023 Chest tube placement, chest tube removal 10/17/2023 Transfused 2 units PRBCs 11/07/2023 PICC line placement 11/07/2023   [place] EKG SITE RITE Result Date: 11/07/2023 If Site Rite image not attached, placement could not be confirmed due to current cardiac rhythm.  DG CHEST PORT 1 VIEW Result Date: 10/23/2023 CLINICAL DATA:  Fever. EXAM: PORTABLE CHEST 1 VIEW COMPARISON:  October 17, 2023. FINDINGS: The heart size and mediastinal contours  are within normal limits. Minimal right upper lobe opacity is noted concerning for possible pneumonia. Minimal left basilar atelectasis or infiltrate is noted. The visualized skeletal structures are unremarkable. IMPRESSION: Minimal right upper lobe opacity concerning for possible pneumonia. Minimal left basilar atelectasis or infiltrate is noted. Followup PA and lateral chest X-ray is recommended in 3-4 weeks following trial of antibiotic therapy to ensure resolution and exclude underlying malignancy. Electronically Signed   By: Lupita Raider M.D.   On: 10/23/2023 13:23   CARDIAC CATHETERIZATION Result Date: 10/20/2023 3 vessel obstructive CAD. Suspect culprit vessel is the LCx. Low LVEDP 4 mm Hg Plan: recommend medical therapy. She is not having significant angina. She is currently a poor candidate for CABG. Poor targets for PCI.   DG CHEST PORT 1 VIEW Result Date: 10/17/2023 CLINICAL DATA:  Pneumothorax. EXAM: PORTABLE CHEST 1 VIEW COMPARISON:  10/13/2023 FINDINGS: Interval extubation and NG tube removal. Right IJ central line tip overlies the upper right atrium. Left pleural drain remains in place has been repositioned in the interval. No discernible left-sided pneumothorax. Soft tissue gas in the lateral left chest wall seen previously has decreased in the interval. Telemetry leads overlie the chest. IMPRESSION: 1. Interval extubation and NG tube removal. 2. Left pleural drain remains in place without discernible pneumothorax. Electronically Signed   By: Kennith Center M.D.   On: 10/17/2023 08:48   CT HEAD WO CONTRAST ( ) Result Date: 10/15/2023 CLINICAL DATA:  Cardiac arrest. EXAM: CT HEAD WITHOUT CONTRAST TECHNIQUE: Contiguous axial images were obtained from the base of the skull through the vertex without intravenous contrast. RADIATION DOSE REDUCTION: This exam was performed according to the departmental dose-optimization program which includes automated exposure control, adjustment of the mA  and/or kV according to patient size and/or use of iterative reconstruction technique. COMPARISON:  CTA head and neck 10/30/2021 FINDINGS: Brain: There is no evidence of an acute large territory infarct, intracranial hemorrhage, cerebral edema, mass, midline shift, or extra-axial fluid collection. Cerebral volume is within normal limits for age. The ventricles are normal in size. Cerebral white matter hypodensities in the cerebral white matter have mildly progressed and are nonspecific but compatible with mild chronic small vessel ischemic disease. There is a  new age indeterminate lacunar infarct in the right external capsule region. Vascular: Calcified atherosclerosis at the skull base. No hyperdense vessel. Skull: No acute fracture or suspicious lesion. Sinuses/Orbits: Moderate left ethmoid and left maxillary sinus mucosal thickening. Fluid in the left maxillary and bilateral sphenoid sinuses which may be related to intubation. Clear mastoid air cells. Bilateral cataract extraction. Other: None. IMPRESSION: 1. No CT findings of diffuse hypoxic ischemic injury, acute large territory infarct, or intracranial hemorrhage. 2. Age indeterminate lacunar infarct in the right external capsule. 3. Mild chronic small vessel ischemic disease. Electronically Signed   By: Sebastian Ache M.D.   On: 10/15/2023 14:48   ECHOCARDIOGRAM COMPLETE Result Date: 10/14/2023    ECHOCARDIOGRAM REPORT   Patient Name:   MEKIAH WAHLER Date of Exam: 10/14/2023 Medical Rec #:  [id number]      Height:       62.0 in Accession #:    [id number]     Weight:       147.7 lb Date of Birth:  06/04/1946       BSA:          1.681 m Patient Age:    77 years       BP:           83/63 mmHg Patient Gender: F              HR:           67 bpm. Exam Location:  Inpatient Procedure: 2D Echo, Color Doppler and Cardiac Doppler (Both Spectral and Color            Flow Doppler were utilized during procedure). Indications:    R55 Syncope  History:        Patient has  prior history of Echocardiogram examinations, most                 recent 06/07/2021. Abnormal ECG, Arrythmias:Cardiac Arrest; Risk                 Factors:Hypertension, Diabetes, Dyslipidemia and Current Smoker.                 FLU positive. Breast cancer. Left pneumothorax. Chest tube.  Sonographer:    Sheralyn Boatman RDCS Referring Phys: Cecille Po [facility]  Sonographer Comments: Technically difficult study due to poor echo windows, Technically challenging study due to limited acoustic windows, no apical window and echo performed with patient supine and on artificial respirator. Image acquisition challenging  due to mastectomy. No apical images. IMPRESSIONS  1. Left ventricular ejection fraction, by estimation, is 60 to 65%. The left ventricle has normal function. The left ventricle has no regional wall motion abnormalities. Left ventricular diastolic parameters are indeterminate.  2. Right ventricular systolic function is normal. The right ventricular size is normal. There is normal pulmonary artery systolic pressure. The estimated right ventricular systolic pressure is 15.5 mmHg.  3. The mitral valve is normal in structure. Trivial mitral valve regurgitation. No evidence of mitral stenosis. Moderate mitral annular calcification.  4. The aortic valve is normal in structure. Aortic valve regurgitation is not visualized. No aortic stenosis is present.  5. The inferior vena cava is normal in size with greater than 50% respiratory variability, suggesting right atrial pressure of 3 mmHg. FINDINGS  Left Ventricle: Left ventricular ejection fraction, by estimation, is 60 to 65%. The left ventricle has normal function. The left ventricle has no regional wall motion abnormalities. The left ventricular internal cavity size was normal in size.  There is  no left ventricular hypertrophy. Left ventricular diastolic parameters are indeterminate. Right Ventricle: The right ventricular size is normal. No increase in right ventricular  wall thickness. Right ventricular systolic function is normal. There is normal pulmonary artery systolic pressure. The tricuspid regurgitant velocity is 1.77 m/s, and  with an assumed right atrial pressure of 3 mmHg, the estimated right ventricular systolic pressure is 15.5 mmHg. Left Atrium: Left atrial size was normal in size. Right Atrium: Right atrial size was normal in size. Pericardium: There is no evidence of pericardial effusion. Mitral Valve: The mitral valve is normal in structure. Moderate mitral annular calcification. Trivial mitral valve regurgitation. No evidence of mitral valve stenosis. Tricuspid Valve: The tricuspid valve is normal in structure. Tricuspid valve regurgitation is trivial. No evidence of tricuspid stenosis. Aortic Valve: The aortic valve is normal in structure. Aortic valve regurgitation is not visualized. No aortic stenosis is present. Pulmonic Valve: The pulmonic valve was normal in structure. Pulmonic valve regurgitation is not visualized. No evidence of pulmonic stenosis. Aorta: The aortic root is normal in size and structure. Venous: The inferior vena cava is normal in size with greater than 50% respiratory variability, suggesting right atrial pressure of 3 mmHg. IAS/Shunts: No atrial level shunt detected by color flow Doppler.  LEFT VENTRICLE PLAX 2D LVIDd:         4.40 cm LVIDs:         3.40 cm LV PW:         0.90 cm LV IVS:        0.80 cm LVOT diam:     1.90 cm LVOT Area:     2.84 cm  IVC IVC diam: 2.00 cm LEFT ATRIUM         Index LA diam:    3.90 cm 2.32 cm/m   AORTA Ao Asc diam: 3.20 cm TRICUSPID VALVE TR Peak grad:   12.5 mmHg TR Vmax:        177.00 cm/s  SHUNTS Systemic Diam: 1.90 cm Maudine Sos MD Electronically signed by Maudine Sos MD Signature Date/Time: 10/14/2023/3:10:05 PM    Final    DG Chest Port 1 View Result Date: 10/14/2023 CLINICAL DATA:  Left pneumothorax, chest tube EXAM: PORTABLE CHEST 1 VIEW COMPARISON:  10/13/2023 FINDINGS: Interval  placement of left chest tube with re-expansion of the left lung. No visible pneumothorax. Subcutaneous emphysema in the left chest wall. No confluent airspace opacities. Endotracheal tube, right central line and NG tube are unchanged. IMPRESSION: Interval placement of left chest tube with re-expansion of the left lung. No visible residual pneumothorax. Left chest wall subcutaneous emphysema. Electronically Signed   By: Janeece Mechanic M.D.   On: 10/14/2023 00:18   DG CHEST PORT 1 VIEW Result Date: 10/13/2023 CLINICAL DATA:  Encounter for central line placement. Follow-up left pneumothorax. [id number]. EXAM: PORTABLE CHEST 1 VIEW COMPARISON:  PA and lateral chest earlier today, more recent portable chest today at 6:28 p.m. FINDINGS: 9:06 p.m. ETT tip is 3.1 cm from the carina. NGT likely terminates in the body of stomach based on the position of the side hole although the tip is not in the field. There is a new right IJ central line terminating about the superior cavoatrial junction. No right pneumothorax. A left pneumothorax is again noted and extends from the apex to the base. There is evidence of tension pneumothorax with depression of the left hemidiaphragm and slight shift of mediastinum to the right. The pneumothorax is estimated up to 30% of the  left chest volume. This is not notably changed from the last film. No pleural fluid is seen. There is patchy right upper lobe airspace disease again noted with remaining lungs clear. The cardiomediastinal silhouette and central vessels are normal. There is calcification of the transverse aorta. Advanced degenerative change thoracic spine. Recent fracture posterolateral left sixth rib is again noted. IMPRESSION: 1. New right IJ central line terminating about the superior cavoatrial junction. No right pneumothorax. 2. Left tension pneumothorax estimated up to 30% of the left chest volume, with depressed left hemidiaphragm and slight mediastinal shift. This is not notably  changed from the last film. 3. Patchy right upper lobe airspace disease. 4. ETT and NGT as above. 5. Recent fracture posterolateral left sixth rib. 6. These results will be called to the ordering clinician or representative by the Radiologist Assistant, and communication documented in the PACS or [employer]. Electronically Signed   By: Almira Bar M.D.   On: 10/13/2023 22:19   DG Chest Portable 1 View Result Date: 10/13/2023 CLINICAL DATA:  Status post intubation. EXAM: PORTABLE CHEST 1 VIEW COMPARISON:  October 13, 2023 (10:08 a.m.) FINDINGS: Endotracheal tube is seen with its distal tip approximately 5.3 cm from the carina. An enteric tube is noted with its distal end extending below the level of the diaphragm. The heart size and mediastinal contours are within normal limits. Mild, hazy airspace disease is seen overlying the upper right lung. This represents a new finding when compared to the prior study. No pleural effusion is identified. There is a small left-sided pneumothorax. This extends from the left apex to the lower left lung and represents a new finding. Acute sixth left rib fracture is seen. IMPRESSION: 1. Endotracheal tube and enteric tube positioning, as described above. 2. Small left-sided pneumothorax. 3. Mild, hazy right upper lobe airspace disease. 4. Acute sixth left rib fracture. Electronically Signed   By: Aram Candela M.D.   On: 10/13/2023 19:54   DG Chest 2 View Result Date: 10/13/2023 CLINICAL DATA:  Shortness of breath.  Cough EXAM: CHEST - 2 VIEW COMPARISON:  X-ray 07/06/2023. FINDINGS: No consolidation, pneumothorax or effusion. Normal cardiopericardial silhouette. Calcified aorta. Overlapping cardiac leads. Degenerative changes seen along the spine. The extreme inferior posterior costophrenic angles are clipped off the edge of the film on the lateral view. IMPRESSION: No acute cardiopulmonary disease. Electronically Signed   By: Karen Kays M.D.   On: 10/13/2023 11:06        The results of significant diagnostics from this hospitalization (including imaging, microbiology, ancillary and laboratory) are listed below for reference.     Microbiology: No results found for this or any previous visit (from the past 240 hours).   Labs:  CBC: Recent Labs  Lab 11/06/23 1240 11/08/23 0522 11/08/23 1643 11/09/23 0542 11/10/23 0938 11/11/23 0410  WBC 6.7 5.7  --  5.3 4.9 5.8  HGB 7.1* 9.9* 9.8* 10.2* 10.4* 9.3*  HCT 23.0* 31.1* 31.1* 32.3* 33.4* 30.0*  MCV 111.7* 97.5  --  98.8 99.4 100.3*  PLT 298 241  --  226 231 188   BMP &GFR Recent Labs  Lab 11/06/23 1108 11/08/23 0522 11/09/23 0542 11/10/23 0938 11/11/23 0410  NA 137 139 139 141 139  K 4.5 4.1 4.4 4.1 4.1  CL 104 106 106 108 108  CO2 26 25 26 25 25   GLUCOSE 251* 186* 184* 114* 197*  BUN 14 8 7* 6* 8  CREATININE 0.95 0.83 0.84 0.85 0.84  CALCIUM 8.9 8.6*  8.8* 9.1 8.9  MG  --  2.0  --   --   --   PHOS 2.7  --   --   --   --    Estimated Creatinine Clearance: 48.1 mL/min (by C-G formula based on SCr of 0.84 mg/dL). Liver & Pancreas: Recent Labs  Lab 11/06/23 1108  ALBUMIN 2.6*   No results for input(s): "LIPASE", "AMYLASE" in the last 168 hours. No results for input(s): "AMMONIA" in the last 168 hours. Diabetic: No results for input(s): "HGBA1C" in the last 72 hours. Recent Labs  Lab 11/10/23 1138 11/10/23 1745 11/10/23 2158 11/11/23 0607 11/11/23 1159  GLUCAP 110* 127* 178* 172* 283*   Cardiac Enzymes: No results for input(s): "CKTOTAL", "CKMB", "CKMBINDEX", "TROPONINI" in the last 168 hours. No results for input(s): "PROBNP" in the last 8760 hours. Coagulation Profile: No results for input(s): "INR", "PROTIME" in the last 168 hours. Thyroid Function Tests: No results for input(s): "TSH", "T4TOTAL", "FREET4", "T3FREE", "THYROIDAB" in the last 72 hours. Lipid Profile: No results for input(s): "CHOL", "HDL", "LDLCALC", "TRIG", "CHOLHDL", "LDLDIRECT" in the last 72  hours. Anemia Panel: No results for input(s): "VITAMINB12", "FOLATE", "FERRITIN", "TIBC", "IRON", "RETICCTPCT" in the last 72 hours. Urine analysis:    Component Value Date/Time   COLORURINE YELLOW 10/23/2023 1526   APPEARANCEUR CLEAR 10/23/2023 1526   LABSPEC 1.015 10/23/2023 1526   PHURINE 5.0 10/23/2023 1526   GLUCOSEU 50 (A) 10/23/2023 1526   GLUCOSEU >=1000 (A) 08/04/2017 1622   HGBUR NEGATIVE 10/23/2023 1526   BILIRUBINUR NEGATIVE 10/23/2023 1526   BILIRUBINUR neg 10/25/2021 1350   KETONESUR 20 (A) 10/23/2023 1526   PROTEINUR NEGATIVE 10/23/2023 1526   UROBILINOGEN negative (A) 10/25/2021 1350   UROBILINOGEN 0.2 08/04/2017 1622   NITRITE NEGATIVE 10/23/2023 1526   LEUKOCYTESUR TRACE (A) 10/23/2023 1526   Sepsis Labs: Invalid input(s): "PROCALCITONIN", "LACTICIDVEN"   SIGNED:  Theadore Finger, MD  [facility] 11/11/2023, 9:36 PM

## 2023-11-11 NOTE — Plan of Care (Signed)
 Discharge instructions discussed with patient.  Patient instructed on home medications, restrictions, and follow up appointments. Belongings gathered and sent with patient.  Patients medications sent to [facility]. Patient discharged via wheelchair by this Clinical research associate.

## 2023-11-11 NOTE — Anesthesia Postprocedure Evaluation (Signed)
 Anesthesia Post Note  Patient: Anita Mccormick  Procedure(s) Performed: COLONOSCOPY EGD (ESOPHAGOGASTRODUODENOSCOPY) EGD, WITH ARGON PLASMA COAGULATION BIOPSY POLYPECTOMY, INTESTINE     Patient location during evaluation: PACU Anesthesia Type: MAC Level of consciousness: awake and alert Pain management: pain level controlled Vital Signs Assessment: post-procedure vital signs reviewed and stable Respiratory status: spontaneous breathing, nonlabored ventilation, respiratory function stable and patient connected to nasal cannula oxygen Cardiovascular status: stable and blood pressure returned to baseline Postop Assessment: no apparent nausea or vomiting Anesthetic complications: no   No notable events documented.  Last Vitals:  Vitals:   11/11/23 0530 11/11/23 0836  BP: (!) 116/47 137/76  Pulse: 87   Resp: 18   Temp: 37.3 C 37.2 C  SpO2: 97% 98%    Last Pain:  Vitals:   11/11/23 0836  TempSrc: Oral  PainSc: 0-No pain                 Valente Gaskin Kaiulani Sitton

## 2023-11-12 ENCOUNTER — Telehealth: Payer: Self-pay

## 2023-11-12 ENCOUNTER — Telehealth: Payer: Self-pay | Admitting: Internal Medicine

## 2023-11-12 ENCOUNTER — Encounter: Payer: Self-pay | Admitting: Internal Medicine

## 2023-11-12 ENCOUNTER — Other Ambulatory Visit: Payer: Self-pay | Admitting: Family Medicine

## 2023-11-12 LAB — SURGICAL PATHOLOGY

## 2023-11-12 NOTE — Telephone Encounter (Signed)
 Patient daughter is calling to find out the results of her mother biopsy. Patient daughter also stated that she would like for the nurse to return her call so she is able to schedule her mother accordingly for a follow up. Please advise.

## 2023-11-12 NOTE — Telephone Encounter (Unsigned)
 Copied from CRM [id number]. Topic: Clinical - Medication Refill >> Nov 12, 2023  3:57 PM Ovid Blow wrote: Most Recent Primary Care Visit:  Provider: Georga Killings A  Department: [facility]  Visit Type: NURSE VISIT  Date: 10/09/2023  Medication: atorvastatin (LIPITOR) 80 MG tablet nitroGLYCERIN (NITROSTAT) 0.4 MG SL tablet  Has the patient contacted their pharmacy? Yes (Agent: If no, request that the patient contact the pharmacy for the refill. If patient does not wish to contact the pharmacy document the reason why and proceed with request.) (Agent: If yes, when and what did the pharmacy advise?)  Is this the correct pharmacy for this prescription? Yes If no, delete pharmacy and type the correct one.  This is the patient's preferred pharmacy:   [id number] - [place], [place] - [address]   Has the prescription been filled recently? No  Is the patient out of the medication? No  Has the patient been seen for an appointment in the last year OR does the patient have an upcoming appointment? Yes  Can we respond through MyChart? Yes  Agent: Please be advised that Rx refills may take up to 3 business days. We ask that you follow-up with your pharmacy.

## 2023-11-12 NOTE — Transitions of Care (Post Inpatient/ED Visit) (Signed)
 11/12/2023  Name: Anita Mccormick MRN: [id number] DOB: 09/23/1945  Today's TOC FU Call Status: Today's TOC FU Call Status:: Successful TOC FU Call Completed TOC FU Call Complete Date: 11/12/23 Patient's Name and Date of Birth confirmed.  Transition Care Management Follow-up Telephone Call How have you been since you were released from the hospital?: Better (" amazing"  using walker, able to get up by herself last night to go to the bathroom) Any questions or concerns?: Yes Patient Questions/Concerns:: medication questions Patient Questions/Concerns Addressed: Other:  Items Reviewed: Did you receive and understand the discharge instructions provided?: Yes Medications obtained,verified, and reconciled?: Yes (Medications Reviewed) Any new allergies since your discharge?: No Dietary orders reviewed?: Yes Type of Diet Ordered:: carb modified Do you have support at home?: Yes People in Home [RPT]: child(ren), adult Name of Support/Comfort Primary Source: living with daughter in [place]  Medications Reviewed Today: Medications Reviewed Today     Reviewed by Earlie Server, RN (Registered Nurse) on 11/12/23 at 1132  Med List Status: <None>   Medication Order Taking? Sig Documenting Provider Last Dose Status Informant  acetaminophen (TYLENOL) 325 MG tablet [id number] No Take 2 tablets (650 mg total) by mouth every 6 (six) hours as needed for mild pain (pain score 1-3) (or Fever >/= 101).  Patient not taking: Reported on 11/12/2023   Osvaldo Shipper, MD Not Taking Active   albuterol (VENTOLIN HFA) 108 (90 Base) MCG/ACT inhaler [id number] No Inhale 2 puffs into the lungs every 6 (six) hours as needed for wheezing or shortness of breath.  Patient not taking: Reported on 11/12/2023   Almon Hercules, MD Not Taking Active   aspirin EC 81 MG tablet [id number] Yes Take 81 mg by mouth daily. Swallow whole. [provider] Taking Active Self, Pharmacy Records  atorvastatin (LIPITOR) 80  MG tablet [id number] No Take 1 tablet (80 mg total) by mouth daily.  Patient not taking: Reported on 11/12/2023   Osvaldo Shipper, MD Not Taking Active   blood glucose meter kit and supplies KIT [id number] Yes Dispense based on patient and insurance preference. Use up to four times daily as directed. Deeann Saint, MD Taking Active Self, Pharmacy Records  clopidogrel (PLAVIX) 75 MG tablet [id number] Yes Take 1 tablet (75 mg total) by mouth daily. Almon Hercules, MD Taking Active   Continuous Glucose Sensor (DEXCOM G7 SENSOR) [place] [id number] Yes Apply a new sensor to skin every 10 days. Deeann Saint, MD Taking Active Self, Pharmacy Records  ferrous gluconate [facility]) 324 MG tablet [id number] Yes Take 1 tablet (324 mg total) by mouth daily. Deeann Saint, MD Taking Active Self, Pharmacy Records  fluconazole (DIFLUCAN) 200 MG tablet [id number] Yes Take 1 tablet (200 mg total) by mouth daily for 14 days. Almon Hercules, MD Taking Active   glipiZIDE (GLUCOTROL XL) 10 MG 24 hr tablet [id number] Yes TAKE 1 TABLET BY MOUTH TWICE A DAY Deeann Saint, MD Taking Active Self, Pharmacy Records  Lancets [facility] ULTRASOFT) lancets [id number] Yes Use as instructed Deeann Saint, MD Taking Active Self, Pharmacy Records  metoprolol succinate (TOPROL-XL) 25 MG 24 hr tablet [id number] Yes Take 1 tablet (25 mg total) by mouth daily. Almon Hercules, MD Taking Active   mometasone-formoterol [facility]) 200-5 MCG/ACT Sandrea Matte [id number] No Inhale 2 puffs into the lungs 2 (two) times daily.  Patient not taking: Reported on 11/12/2023   Almon Hercules, MD Not Taking Active  Med Note (ROSE, Haakon Titsworth U   Thu Nov 12, 2023 11:27 AM) Reports cost 300-400 and not filled at [facility].    Multiple Vitamin (MULTIVITAMIN) tablet [id number] Yes Take 1 tablet by mouth daily. [provider] Taking Active Self, Pharmacy Records  nitroGLYCERIN (NITROSTAT) 0.4 MG SL tablet [id number] No Place 1 tablet (0.4 mg total)  under the tongue every 5 (five) minutes as needed for chest pain.  Patient not taking: Reported on 11/12/2023   Gonfa, Taye T, MD Not Taking Active            Med Note (ROSE, Valeria Boza U   Thu Nov 12, 2023 11:29 AM) No RX  pantoprazole (PROTONIX) 40 MG tablet [id number] Yes Take 1 tablet (40 mg total) by mouth 2 (two) times daily for 60 days, THEN 1 tablet (40 mg total) daily. Gonfa, Taye T, MD Taking Active             Home Care and Equipment/Supplies: Were Home Health Services Ordered?: Yes Name of Home Health Agency:: [facility] Has Agency set up a time to come to your home?: Yes (The company called and daughter will call back) First Home Health Visit Date:  (unknown) Any new equipment or medical supplies ordered?: No  Functional Questionnaire: Do you need assistance with bathing/showering or dressing?: Yes (family) Do you need assistance with meal preparation?: Yes (family) Do you need assistance with eating?: Yes (family cutting up food) Do you have difficulty maintaining continence: No Do you need assistance with getting out of bed/getting out of a chair/moving?: Yes (needs a person to assist and walker) Do you have difficulty managing or taking your medications?: Yes (daughter)  Follow up appointments reviewed: PCP Follow-up appointment confirmed?: Yes Date of PCP follow-up appointment?: 11/18/23 Follow-up Provider: Dr. Arliss Lam Specialist [facility] Follow-up appointment confirmed?: No Reason Specialist Follow-Up Not Confirmed: Patient has Specialist Provider Number and will Call for Appointment Do you need transportation to your follow-up appointment?: No Do you understand care options if your condition(s) worsen?: Yes-patient verbalized understanding  SDOH Interventions Today    Flowsheet Row Most Recent Value  SDOH Interventions   Food Insecurity Interventions Intervention Not Indicated  Housing Interventions Intervention Not Indicated   Transportation Interventions Intervention Not Indicated  Utilities Interventions Intervention Not Indicated     Lengthy call with daughter regarding discharge plan and instructions.  Daughter reports that patient is doing very well after being in the hospital for a long time.   Daughter reports that patient has returned with her to [facility] for atleast 2 weeks.  Daughter reports that she has reviewed discharge packet and has some questions.  (1) no Lipitor prescription, no nitroglycerin, dulera cost of 400.00. No CBG machine.   Reviewed with daughter prescription card on line. Daughter and I both looked up and reviewed how her mom could get co pay card.   Daughter with concerns about Lipitor prescription and will talk with PCP. Daughter reports that patient has 3 wounds. ( 1)Pressure wound on bottom.  Daughter removed pad and will reapply,(2&3 ) wound on abdomen from heparin shots per daughter.  Daughter reports a pad on abd and the PICC line site.  Daughter reports she has hospital follow up planned.  Reviewed and provided daughter with Cardiologist name and contact number. Reviewed and provided GI MD name and contact number.   Reviewed with daughter her concern for patients hoarse voice and cough when eating.  I reviewed swallowing precautions. Importance of  being fully upright when eating and drinking. Encouraged patient not to talk when eating or drinking. Reviewed importance of chewing well and making sure patient swallows. Reviewed importance of small bites and cutting food up well.  Encouraged daughter to discuss concerns with MD.  Reviewed with daughter that coughing could be a sign of aspiration and if she feels this is the case to call MD immediately or send a message.    Daughter reports that she has contacted home health already.   Daughter reports that she is comfortable with assisting with wound care and getting medications and CBG machine. Offered to assist but daughter feels ok and will  call me if needed for assistance. Provided my contact information for daughter to call me if needed.  Reviewed and offered 30 day TOC program and daughter declined. Orpha Blade, RN, BSN, CEN [employer]- Transition of Care Team.  [facility] [phone]

## 2023-11-12 NOTE — Transitions of Care (Post Inpatient/ED Visit) (Signed)
   11/12/2023  Name: ELEESHA PURKEY MRN: [id number] DOB: [birth year]  Today's TOC FU Call Status: Today's TOC FU Call Status:: Unsuccessful Call (1st Attempt) Unsuccessful Call (1st Attempt) Date: 11/12/23 (daughter unable to talk and will call me back)  Attempted to reach the patient regarding the most recent Inpatient/ED visit.  Daughter answered call and states that it was a bad time to talk and she will call me back.  Follow Up Plan: Additional outreach attempts will be made to reach the patient to complete the Transitions of Care (Post Inpatient/ED visit) call.   Orpha Blade, RN, BSN, CEN [employer]- Transition of Care Team.  [facility] [phone]

## 2023-11-14 ENCOUNTER — Other Ambulatory Visit: Payer: Self-pay | Admitting: Family Medicine

## 2023-11-16 ENCOUNTER — Telehealth: Payer: Self-pay

## 2023-11-16 DIAGNOSIS — J440 Chronic obstructive pulmonary disease with acute lower respiratory infection: Secondary | ICD-10-CM | POA: Diagnosis not present

## 2023-11-16 DIAGNOSIS — E785 Hyperlipidemia, unspecified: Secondary | ICD-10-CM | POA: Diagnosis not present

## 2023-11-16 DIAGNOSIS — J101 Influenza due to other identified influenza virus with other respiratory manifestations: Secondary | ICD-10-CM | POA: Diagnosis not present

## 2023-11-16 DIAGNOSIS — J441 Chronic obstructive pulmonary disease with (acute) exacerbation: Secondary | ICD-10-CM | POA: Diagnosis not present

## 2023-11-16 DIAGNOSIS — I130 Hypertensive heart and chronic kidney disease with heart failure and stage 1 through stage 4 chronic kidney disease, or unspecified chronic kidney disease: Secondary | ICD-10-CM | POA: Diagnosis not present

## 2023-11-16 DIAGNOSIS — E1122 Type 2 diabetes mellitus with diabetic chronic kidney disease: Secondary | ICD-10-CM | POA: Diagnosis not present

## 2023-11-16 DIAGNOSIS — I5032 Chronic diastolic (congestive) heart failure: Secondary | ICD-10-CM | POA: Diagnosis not present

## 2023-11-16 DIAGNOSIS — J9621 Acute and chronic respiratory failure with hypoxia: Secondary | ICD-10-CM | POA: Diagnosis not present

## 2023-11-16 DIAGNOSIS — J1000 Influenza due to other identified influenza virus with unspecified type of pneumonia: Secondary | ICD-10-CM | POA: Diagnosis not present

## 2023-11-16 NOTE — Telephone Encounter (Signed)
See other phone note routed to PCP

## 2023-11-16 NOTE — Telephone Encounter (Signed)
 Pt daughter Anita Mccormick was made aware of recent results and recommendations from message sent by Dr. Rosaline Coma to pt my chart.  Anita Mccormick  verbalized understanding with all questions answered.

## 2023-11-16 NOTE — Telephone Encounter (Signed)
Has appt 4/23

## 2023-11-16 NOTE — Telephone Encounter (Signed)
 Copied from CRM [id number]. Topic: Clinical - Prescription Issue >> Nov 12, 2023  4:02 PM Ovid Blow wrote: Reason for CRM: Patient medication mometasone -formoterol  (DULERA ) 200-5 MCG/ACT AERO is $300 w/o insurance and with insurance it's $400. Patient is trying to get coupons but can not access them. Patient is looking for some advice or a generic version.

## 2023-11-16 NOTE — Telephone Encounter (Signed)
 Copied from CRM [id number]. Topic: Clinical - Prescription Issue >> Nov 11, 2023  4:38 PM Chuck Crater wrote: Reason for CRM: Patient daughter stated patient just left the hospital and was prescribed  mometasone -formoterol  (DULERA ) 200-5 MCG/ACT AERO. She states that it is $300-$400 and they can't afford that. Patient only has enough for tonight. [facility] [place].

## 2023-11-17 ENCOUNTER — Telehealth: Payer: Self-pay

## 2023-11-17 ENCOUNTER — Other Ambulatory Visit: Payer: Self-pay

## 2023-11-17 NOTE — Addendum Note (Signed)
 Addended by: Georga Killings A on: 11/17/2023 02:45 PM   Modules accepted: Orders

## 2023-11-17 NOTE — Telephone Encounter (Signed)
 Copied from CRM [id number]. Topic: Clinical - Medication Refill >> Nov 12, 2023  3:57 PM Ovid Blow wrote: Most Recent Primary Care Visit:  Provider: Georga Killings A  Department: [facility]  Visit Type: NURSE VISIT  Date: 10/09/2023  Medication: atorvastatin  (LIPITOR ) 80 MG tablet nitroGLYCERIN  (NITROSTAT ) 0.4 MG SL tablet  Has the patient contacted their pharmacy? Yes (Agent: If no, request that the patient contact the pharmacy for the refill. If patient does not wish to contact the pharmacy document the reason why and proceed with request.) (Agent: If yes, when and what did the pharmacy advise?)  Is this the correct pharmacy for this prescription? Yes If no, delete pharmacy and type the correct one.  This is the patient's preferred pharmacy:   [id number] [place], [place] - [address]   Has the prescription been filled recently? No  Is the patient out of the medication? No  Has the patient been seen for an appointment in the last year OR does the patient have an upcoming appointment? Yes  Can we respond through MyChart? Yes  Agent: Please be advised that Rx refills may take up to 3 business days. We ask that you follow-up with your pharmacy. >> Nov 17, 2023 11:56 AM Allyne Areola wrote: Patient's daughter is calling to follow up on the medication request she placed on 11/12/2023, she followed up with Pharmacy and they informed her that they have not received a prescription and patient is completely out of her medications.

## 2023-11-17 NOTE — Telephone Encounter (Signed)
 Copied from CRM [id number]. Topic: Clinical - Medication Refill >> Nov 12, 2023  3:57 PM Ovid Blow wrote: Most Recent Primary Care Visit:  Provider: Georga Killings A  Department: [facility]  Visit Type: NURSE VISIT  Date: 10/09/2023  Medication: atorvastatin  (LIPITOR ) 80 MG tablet nitroGLYCERIN  (NITROSTAT ) 0.4 MG SL tablet  Has the patient contacted their pharmacy? Yes (Agent: If no, request that the patient contact the pharmacy for the refill. If patient does not wish to contact the pharmacy document the reason why and proceed with request.) (Agent: If yes, when and what did the pharmacy advise?)  Is this the correct pharmacy for this prescription? Yes If no, delete pharmacy and type the correct one.  This is the patient's preferred pharmacy:   [id number] [place], [place] - [address]   Has the prescription been filled recently? No  Is the patient out of the medication? No  Has the patient been seen for an appointment in the last year OR does the patient have an upcoming appointment? Yes  Can we respond through MyChart? Yes  Agent: Please be advised that Rx refills may take up to 3 business days. We ask that you follow-up with your pharmacy. >> Nov 17, 2023  4:43 PM Melissa C wrote: Patient's daughter is calling again to follow up on the medications as they have not received them yet. However she did state [facility] [place] [facility], both [facility] [place]. Please advise with Pharmacy and patient's daughter would also like a call.  >> Nov 17, 2023 11:56 AM Allyne Areola wrote: Patient's daughter is calling to follow up on the medication request she placed on 11/12/2023, she followed up with Pharmacy and they informed her that they have not received a prescription and patient is completely out of her medications.

## 2023-11-17 NOTE — Telephone Encounter (Signed)
 Copied from CRM [id number]. Topic: Clinical - Prescription Issue >> Nov 12, 2023  4:02 PM Ovid Blow wrote: Reason for CRM: Patient medication mometasone -formoterol  (DULERA ) 200-5 MCG/ACT AERO is $300 w/o insurance and with insurance it's $400. Patient is trying to get coupons but can not access them. Patient is looking for some advice or a generic version. >> Nov 17, 2023 11:59 AM Allyne Areola wrote: Patient's daughter is calling to follow up on this request. The medication is to expensive and wants to know if there is any way to lower the cost.

## 2023-11-17 NOTE — Telephone Encounter (Signed)
 Copied from CRM [id number]. Topic: Clinical - Prescription Issue >> Nov 12, 2023  4:02 PM Ovid Blow wrote: Reason for CRM: Patient medication mometasone -formoterol  (DULERA ) 200-5 MCG/ACT AERO is $300 w/o insurance and with insurance it's $400. Patient is trying to get coupons but can not access them. Patient is looking for some advice or a generic version. >> Nov 17, 2023  4:51 PM Melissa C wrote: Patient's daughter calling again regarding this prescription issue. She was wondering if there had been any outcome yet. I let her know that it looked like the message had been seen and forwarded on, however she still asked that I send message asking that someone give her a call regarding this. Please advise. Thank you.  >> Nov 17, 2023 11:59 AM Allyne Areola wrote: Patient's daughter is calling to follow up on this request. The medication is to expensive and wants to know if there is any way to lower the cost.

## 2023-11-18 ENCOUNTER — Encounter: Payer: Self-pay | Admitting: Family Medicine

## 2023-11-18 ENCOUNTER — Ambulatory Visit: Admitting: Family Medicine

## 2023-11-18 ENCOUNTER — Telehealth: Payer: Self-pay

## 2023-11-18 VITALS — BP 130/74 | HR 73 | Temp 98.8°F | Ht 62.0 in | Wt 135.6 lb

## 2023-11-18 DIAGNOSIS — Z8673 Personal history of transient ischemic attack (TIA), and cerebral infarction without residual deficits: Secondary | ICD-10-CM | POA: Diagnosis not present

## 2023-11-18 DIAGNOSIS — Z8709 Personal history of other diseases of the respiratory system: Secondary | ICD-10-CM

## 2023-11-18 DIAGNOSIS — Z8674 Personal history of sudden cardiac arrest: Secondary | ICD-10-CM | POA: Diagnosis not present

## 2023-11-18 DIAGNOSIS — I214 Non-ST elevation (NSTEMI) myocardial infarction: Secondary | ICD-10-CM | POA: Diagnosis not present

## 2023-11-18 DIAGNOSIS — I10 Essential (primary) hypertension: Secondary | ICD-10-CM

## 2023-11-18 DIAGNOSIS — Z7984 Long term (current) use of oral hypoglycemic drugs: Secondary | ICD-10-CM | POA: Diagnosis not present

## 2023-11-18 DIAGNOSIS — E1129 Type 2 diabetes mellitus with other diabetic kidney complication: Secondary | ICD-10-CM

## 2023-11-18 DIAGNOSIS — E785 Hyperlipidemia, unspecified: Secondary | ICD-10-CM | POA: Diagnosis not present

## 2023-11-18 DIAGNOSIS — D539 Nutritional anemia, unspecified: Secondary | ICD-10-CM

## 2023-11-18 DIAGNOSIS — Z09 Encounter for follow-up examination after completed treatment for conditions other than malignant neoplasm: Secondary | ICD-10-CM

## 2023-11-18 MED ORDER — BLOOD PRESSURE MONITOR DEVI: 1.0000 | 0 refills | Status: DC

## 2023-11-18 MED ORDER — BLOOD GLUCOSE MONITORING SUPPL DEVI: 1.0000 | Freq: Three times a day (TID) | 0 refills | Status: DC

## 2023-11-18 MED ORDER — NITROGLYCERIN 0.4 MG SL SUBL: 0.4000 mg | SUBLINGUAL_TABLET | SUBLINGUAL | 5 refills | Status: AC | PRN

## 2023-11-18 MED ORDER — BLOOD GLUCOSE TEST VI STRP: 1.0000 | ORAL_STRIP | Freq: Three times a day (TID) | 11 refills | Status: DC

## 2023-11-18 MED ORDER — LANCET DEVICE MISC: 1.0000 | Freq: Three times a day (TID) | 0 refills | Status: AC

## 2023-11-18 MED ORDER — LANCETS MISC. MISC: 1.0000 | Freq: Three times a day (TID) | 11 refills | Status: DC

## 2023-11-18 MED ORDER — ATORVASTATIN CALCIUM 80 MG PO TABS: 80.0000 mg | ORAL_TABLET | Freq: Every day | ORAL | 3 refills | Status: DC

## 2023-11-18 NOTE — Telephone Encounter (Signed)
 Price checked various inhaler options for patient. Patient does have a $250 deductible to meet for tier 3-5 medications this year. All maintenance inhalers and substitutions for Dulera  are expensive due to deductible and have a $47 monthly copay after deductible met.   Anita Mccormick does have a patient assistance program that patient may qualify for if PCP deems appropriate. Other cheaper alternatives would be rescue albuterol  inhaler to use as needed. Spoke with PCP, will discuss with patient when she comes in to see her today for hospital follow up.  Carnell Christian, PharmD Clinical Pharmacist [phone]

## 2023-11-18 NOTE — Progress Notes (Signed)
   11/18/2023  Patient ID: Anita Mccormick, female   DOB: 02-06-1946, 78 y.o.   MRN: [id number]  Submitted application to [employer] for [employer] PAP for patient. Pending company decision. Samples provided while we await response.

## 2023-11-18 NOTE — Progress Notes (Signed)
 Established Patient Office Visit   Subjective  Patient ID: Anita Mccormick, female    DOB: [birth year]  Age: 78 y.o. MRN: [id number]  Chief Complaint  Patient presents with   Hospitalization Follow-up  Patient accompanied by her daughter.  Patient is a 78 year old female seen for HFU.  Patient with prolonged hospital course complicated by wound today causing respiratory failure with hypoxia leading to cardiac arrest requiring CPR, pneumothorax status post chest tube.  Patient hospitalized 3/18-4/16/2025 for acute respiratory failure with hypoxia due to influenza A.  Patient initially with SOB requiring supplemental oxygen.  CXR negative for infiltrates.  Tamiflu  started.  Patient had subsequent syncopal event with PEA bradycardia/asystolic arrest with ROSC after 3 rounds of ACLS.  Patient was intubated and transferred to ICU.  Patient later found to have left pneumothorax, pigtail catheter placed.  Started on vasopressors for shock and IV antibiotics.  Cardiology and PCCM consulted.  Troponin trended to greater than 24,000.  Heparin  drip started.  Echo with EF 60-65%.  LHC on 10/20/2023 with three-vessel disease not amenable to PCI.  Continue statin, aspirin  and Plavix .  Low-dose Toprol  XL due to soft BP.  IV Solu-Medrol  also started.  Patient off vasopressors and extubated on 10/17/2023.  Chest tube removed.  Transferred to hospitalist service on 10/18/2023.  Macrocytic anemia with suspected GI bleed.  Hemoccult positive.  EGD with gastritis, nonbleeding gastric and duodenal AVM treated with APC.  Colonoscopy with polyps and nonbleeding internal hemorrhoid.  Received IV iron  and 2 units PRBCs.  GI recommended Protonix  40 mg twice daily x 8 weeks then 40 mg daily.  Pt states she is doing okay.  Adjusting to being back home.  Endorses less energy and decreased appetite.  Staying with her daughter in [place].  Patient advised to hold Trulicity , only taking glipizide .  Concerned blood sugar may increase.   Home health had initial visit, to start next week.  Patient mentions having a piece of bone stuck in her gum prior to hospitalization.  States gum is sore.   Patient Active Problem List   Diagnosis Date Noted   Heme positive stool 11/08/2023   Non-ST elevation (NSTEMI) myocardial infarction (HCC) 10/21/2023   Malnutrition of moderate degree 10/16/2023   Acute respiratory failure with hypoxia (HCC) 10/13/2023   Influenza A 10/13/2023   Cardiac arrest (HCC) 10/13/2023   Pneumothorax on left 10/13/2023   Central retinal artery occlusion of right eye 10/25/2021   Atherosclerosis of aorta (HCC) 05/18/2020   Cirrhosis of liver without ascites (HCC) 05/18/2020   Internal hemorrhoids 06/22/2019   Anemia    Chronic kidney disease (CKD), stage II (mild) 09/19/2017   Leukopenia 09/19/2017   Chronic diastolic CHF (congestive heart failure) (HCC) 09/19/2017   Statins contraindicated 06/12/2017   Hepatotoxicity due to statin drug 06/12/2017   History of adenomatous polyp of colon 03/18/2017   Viral URI with cough 01/17/2017   Thrombocytopenia (HCC) 12/19/2015   Insomnia 08/21/2015   Nicotine  use disorder 05/21/2015   Eczema 05/21/2015   Fatty liver disease, nonalcoholic 07/14/2014   Hyperlipidemia 11/03/2007   History of CVA (cerebrovascular accident) 10/20/2007   Type II diabetes mellitus with renal manifestations (HCC) 12/10/2006   Essential hypertension 12/10/2006   History of breast cancer 12/10/2006   Past Medical History:  Diagnosis Date   Arthritis    Cancer [facility])    breast left   Cerebrovascular accident [facility])    October 29 2021- Left eye- blind   COLONIC POLYPS, HX OF 12/10/2006  Qualifier: Diagnosis of  By: Penney Bowling MD, Bruce     Diabetes mellitus type II, controlled (HCC) 12/10/2006   Poor control on Januvia  100mg  and glipizide  10mg  XL Lab Results  Component Value Date   HGBA1C 8.3* 11/02/2014        Eczema    Fatty liver disease, nonalcoholic 07/14/2014   Per Dr. Penney Bowling  Lab Results  Component Value Date   ALT 31 11/02/2014   AST 57* 11/02/2014   ALKPHOS 104 11/02/2014   BILITOT 1.1 11/02/2014       GERD (gastroesophageal reflux disease)    History of blood transfusion    History of kidney stones 2023   11/28/21 small stone currently, taking flomax    Hypertension    Iron  deficiency anemia, unspecified 05/08/2023   Sepsis (HCC) 09/19/2017   Sepsis due to Escherichia coli (E. coli) (HCC)    Symptomatic anemia 06/06/2021   UTI (urinary tract infection) 09/19/2017   Vaginal discharge 04/01/2022   Past Surgical History:  Procedure Laterality Date   ARTERY BIOPSY Left 11/29/2021   Procedure: LEFT BIOPSY TEMPORAL ARTERY;  Surgeon: Dannis Dy, MD;  Location: [facility] OR;  Service: Vascular;  Laterality: Left;   BIOPSY OF SKIN SUBCUTANEOUS TISSUE AND/OR MUCOUS MEMBRANE  11/10/2023   Procedure: BIOPSY;  Surgeon: Daina Drum, MD;  Location: [facility] ENDOSCOPY;  Service: Gastroenterology;;   CATARACT EXTRACTION Bilateral    COLONOSCOPY N/A 11/10/2023   Procedure: COLONOSCOPY;  Surgeon: Daina Drum, MD;  Location[facility] ENDOSCOPY;  Service: Gastroenterology;  Laterality: N/A;   ESOPHAGOGASTRODUODENOSCOPY N/A 11/10/2023   Procedure: EGD (ESOPHAGOGASTRODUODENOSCOPY);  Surgeon: Daina Drum, MD;  Location: [facility] ENDOSCOPY;  Service: Gastroenterology;  Laterality: N/A;   HOT HEMOSTASIS N/A 11/10/2023   Procedure: EGD, WITH ARGON PLASMA COAGULATION;  Surgeon: Daina Drum, MD;  Location[facility] ENDOSCOPY;  Service: Gastroenterology;  Laterality: N/A;   LEFT HEART CATH AND CORONARY ANGIOGRAPHY N/A 10/20/2023   Procedure: LEFT HEART CATH AND CORONARY ANGIOGRAPHY;  Surgeon: Swaziland, Peter M, MD;  Location: [facility] INVASIVE CV LAB;  Service: Cardiovascular;  Laterality: N/A;   MASTECTOMY Left    POLYPECTOMY  11/10/2023   Procedure: POLYPECTOMY, INTESTINE;  Surgeon: Daina Drum, MD;  Location: [facility] ENDOSCOPY;  Service: Gastroenterology;;   TUBAL LIGATION     Social History   Tobacco  Use   Smoking status: Some Days    Current packs/day: 0.20    Average packs/day: 0.2 packs/day for 10.0 years (2.0 ttl pk-yrs)    Types: Cigarettes   Smokeless tobacco: Never  Vaping Use   Vaping status: Never Used  Substance Use Topics   Alcohol use: Not Currently   Drug use: No   Family History  Problem Relation Age of Onset   Stroke Mother    Cancer Father        prostate   Diabetes Sister    Pancreatic cancer Sister    Allergies  Allergen Reactions   Bee Venom Swelling   Penicillins Hives   Ace Inhibitors Cough    ????   Chlorthalidone     REACTION: unspecified   Lactose Intolerance (Gi) Diarrhea   Metformin      REACTION: gi side effects   Sulfamethoxazole     REACTION: questionable      ROS Negative unless stated above    Objective:     BP 130/74 (BP Location: Right Arm, Patient Position: Sitting, Cuff Size: Normal)   Pulse 73   Temp 98.8 F (37.1 C) (Oral)  Ht 5\' 2"  (1.575 m)   Wt 135 lb 9.6 oz (61.5 kg)   SpO2 99%   BMI 24.80 kg/m  BP Readings from Last 3 Encounters:  11/18/23 130/74  11/11/23 137/76  08/26/23 (!) 140/80   Wt Readings from Last 3 Encounters:  11/18/23 135 lb 9.6 oz (61.5 kg)  11/12/23 133 lb (60.3 kg)  11/11/23 133 lb 6.1 oz (60.5 kg)      Physical Exam Constitutional:      General: She is not in acute distress.    Appearance: Normal appearance.  HENT:     Head: Normocephalic and atraumatic.     Nose: Nose normal.     Mouth/Throat:     Mouth: Mucous membranes are moist.      Comments: 6 mm x 2 mm x 3 mm piece of bone removed from mandibular gingiva at tooth 10. Cardiovascular:     Rate and Rhythm: Normal rate and regular rhythm.     Heart sounds: Normal heart sounds. No murmur heard.    No gallop.  Pulmonary:     Effort: Pulmonary effort is normal. No respiratory distress.     Breath sounds: Normal breath sounds. No wheezing, rhonchi or rales.  Skin:    General: Skin is warm and dry.     Comments: A 6 mm x 2  x 3 piece of bone removed from mandibular gingiva.  Healing surgical incision left upper lateral chest.  Neurological:     Mental Status: She is alert and oriented to person, place, and time.      No results found for any visits on 11/18/23.    Assessment & Plan:  Hospital discharge follow-up  History of CVA (cerebrovascular accident) -     Atorvastatin  Calcium ; Take 1 tablet (80 mg total) by mouth daily.  Dispense: 90 tablet; Refill: 3  Hyperlipidemia, unspecified hyperlipidemia type -     Atorvastatin  Calcium ; Take 1 tablet (80 mg total) by mouth daily.  Dispense: 90 tablet; Refill: 3  Non-ST elevation (NSTEMI) myocardial infarction (HCC) -     Atorvastatin  Calcium ; Take 1 tablet (80 mg total) by mouth daily.  Dispense: 90 tablet; Refill: 3 -     Nitroglycerin ; Place 1 tablet (0.4 mg total) under the tongue every 5 (five) minutes as needed for chest pain.  Dispense: 10 tablet; Refill: 5  History of cardiac arrest  History of pneumothorax  Type 2 diabetes mellitus with other diabetic kidney complication, without long-term current use of insulin  (HCC) -     Blood Glucose Monitoring Suppl; 1 each by Does not apply route in the morning, at noon, and at bedtime. May substitute to any manufacturer covered by patient's insurance.  Dispense: 1 each; Refill: 0 -     Blood Glucose Test; 1 each by In Vitro route in the morning, at noon, and at bedtime. May substitute to any manufacturer covered by patient's insurance.  Dispense: 100 strip; Refill: 11 -     Lancet Device; 1 each by Does not apply route in the morning, at noon, and at bedtime. May substitute to any manufacturer covered by patient's insurance.  Dispense: 1 each; Refill: 0 -     Lancets Misc.; 1 each by Does not apply route in the morning, at noon, and at bedtime. May substitute to any manufacturer covered by patient's insurance.  Dispense: 100 each; Refill: 11  Essential hypertension -     Blood Pressure Monitor; 1 Device by  Does not apply route as directed.  Dispense: 1 each; Refill: 0 -     Atorvastatin  Calcium ; Take 1 tablet (80 mg total) by mouth daily.  Dispense: 90 tablet; Refill: 3  Macrocytic anemia  TCM phone call made and reviewed.  Hospital notes, labs, imaging, etc. reviewed. DM well-controlled as A1c 5.0%.  Will have patient hold Trulicity  and continue glipizide  XL 10 mg daily for blood sugar.  For elevation in blood sugar once appetite improves restart Trulicity .  Discussed Glucerna or other supplemental drinks.  Given Rx for glucometer and supplies.  BP well-controlled.  Continue to monitor given low BP readings during hospitalization.  Continue Toprol  XL 25 mg daily.  Due to NSTEMI continue Plavix  75 mg daily and aspirin  81 mg daily.  Avoid NSAIDs given gastritis and anemia.  Continue tonics 40 mg twice daily x 8 weeks then switch to 40 mg daily.  Patient wishes to wait on repeat CBC.  Encouraged to keep follow-up with GI, cardiology.  Return in about 3 months (around 02/17/2024).   Viola Greulich, MD

## 2023-11-19 ENCOUNTER — Ambulatory Visit: Payer: Self-pay

## 2023-11-19 NOTE — Telephone Encounter (Signed)
 Spoke with patient and patient's Daughter per DPR. Per Dr. Arliss Lam patient is to start using the Trulicity , also had questions about how to use Abuterol (2 puffs as needed every 6 hrs for wheezing and shortness of breath) and Breztri (2 puffs twice a day Am and PM every day),  patient is aware, patient has verbalized understanding

## 2023-11-19 NOTE — Telephone Encounter (Signed)
 Chief Complaint: High Blood Sugar Symptoms: frequent urination Frequency: Today Pertinent Negatives: Patient denies abd pain, N/V, confusion, SOB, dizziness Disposition: [] ED /[] Urgent Care (no appt availability in office) / [] Appointment(In office/virtual)/ []  [place] [facility]/ [x] Home Care/ [] Refused Recommended Disposition /[] [place] [facility]/ []  Follow-up with PCP Additional Notes: Pt's daughter reports pt's BG this AM was 294 upon calling, pt reports her BG levels have usually regulated by this time of day. Pt denies symptoms aside from frequent urination but daughter reports this has been ongoing. This RN educated pt on home care, new-worsening symptoms, when to call back/seek emergent care. Pt verbalized understanding and agrees to plan.    Copied from CRM [id number]. Topic: Clinical - Red Word Triage >> Nov 19, 2023 11:50 AM Marlan Silva wrote: Red Word that prompted transfer to Nurse Triage: Patients daughter called in stating that the patients sugar was measured as high, she states that the reading was 277mg /dL. Patients daughter denies any other symptoms. She just checked and it is now 294mg /dL. Reason for Disposition  [1] Blood glucose 240 - 300 mg/dL (13.3 - 16.7 mmol/L) AND [2] does not  use insulin  (e.g., not insulin -dependent; most people with type 2 diabetes)  Answer Assessment - Initial Assessment Questions 1. BLOOD GLUCOSE: "What is your blood glucose level?"      294 2. ONSET: "When did you check the blood glucose?"     Just now 3. USUAL RANGE: "What is your glucose level usually?" (e.g., usual fasting morning value, usual evening value)     "Should be going down" 5. TYPE 1 or 2:  "Do you know what type of diabetes you have?"  (e.g., Type 1, Type 2, Gestational; doesn't know)      Type 2 6. INSULIN : "Do you take insulin ?" "What type of insulin (s) do you use? What is the mode of delivery? (syringe, pen; injection or pump)?"      None 7. DIABETES PILLS: "Do you  take any pills for your diabetes?" If Yes, ask: "Have you missed taking any pills recently?"     Glipizide , pt has been taking 8. OTHER SYMPTOMS: "Do you have any symptoms?" (e.g., fever, frequent urination, difficulty breathing, dizziness, weakness, vomiting)     Frequent urination  Protocols used: Diabetes - High Blood Sugar-A-AH

## 2023-11-20 DIAGNOSIS — I130 Hypertensive heart and chronic kidney disease with heart failure and stage 1 through stage 4 chronic kidney disease, or unspecified chronic kidney disease: Secondary | ICD-10-CM | POA: Diagnosis not present

## 2023-11-20 DIAGNOSIS — E1122 Type 2 diabetes mellitus with diabetic chronic kidney disease: Secondary | ICD-10-CM | POA: Diagnosis not present

## 2023-11-20 DIAGNOSIS — J101 Influenza due to other identified influenza virus with other respiratory manifestations: Secondary | ICD-10-CM | POA: Diagnosis not present

## 2023-11-20 DIAGNOSIS — E785 Hyperlipidemia, unspecified: Secondary | ICD-10-CM | POA: Diagnosis not present

## 2023-11-20 DIAGNOSIS — J9621 Acute and chronic respiratory failure with hypoxia: Secondary | ICD-10-CM | POA: Diagnosis not present

## 2023-11-20 DIAGNOSIS — I5032 Chronic diastolic (congestive) heart failure: Secondary | ICD-10-CM | POA: Diagnosis not present

## 2023-11-20 DIAGNOSIS — J441 Chronic obstructive pulmonary disease with (acute) exacerbation: Secondary | ICD-10-CM | POA: Diagnosis not present

## 2023-11-20 DIAGNOSIS — J440 Chronic obstructive pulmonary disease with acute lower respiratory infection: Secondary | ICD-10-CM | POA: Diagnosis not present

## 2023-11-20 DIAGNOSIS — J1000 Influenza due to other identified influenza virus with unspecified type of pneumonia: Secondary | ICD-10-CM | POA: Diagnosis not present

## 2023-11-23 ENCOUNTER — Telehealth: Payer: Self-pay

## 2023-11-23 ENCOUNTER — Other Ambulatory Visit: Payer: Self-pay

## 2023-11-23 NOTE — Telephone Encounter (Signed)
 Spoke with patient.

## 2023-11-23 NOTE — Telephone Encounter (Signed)
 Copied from CRM [id number]. Topic: Clinical - Medication Question >> Nov 23, 2023 12:39 PM Allyne Areola wrote: Reason for CRM: Patient's daughter Lendel Quant is calling regarding ferrous gluconate  (FERGON) 324 MG tablet she wanted to know if ferrous sulfate  325 MG would be a good alternative. Best call back number is [phone].

## 2023-11-23 NOTE — Progress Notes (Signed)
   11/23/2023  Patient ID: Anita Mccormick, female   DOB: 07/28/1946, 78 y.o.   MRN: [id number]   Contacted patient via telephone to notify her that her Breztri PAP through [employer] has been approved through 07/27/24.  Notified patient that product should arrive at her home within 7-10 business days and to reach out with any concerns or if product fails to arrive.  Carnell Christian, PharmD Clinical Pharmacist [phone]

## 2023-11-24 DIAGNOSIS — J9621 Acute and chronic respiratory failure with hypoxia: Secondary | ICD-10-CM | POA: Diagnosis not present

## 2023-11-24 DIAGNOSIS — E785 Hyperlipidemia, unspecified: Secondary | ICD-10-CM | POA: Diagnosis not present

## 2023-11-24 DIAGNOSIS — E1122 Type 2 diabetes mellitus with diabetic chronic kidney disease: Secondary | ICD-10-CM | POA: Diagnosis not present

## 2023-11-24 DIAGNOSIS — I5032 Chronic diastolic (congestive) heart failure: Secondary | ICD-10-CM | POA: Diagnosis not present

## 2023-11-24 DIAGNOSIS — J101 Influenza due to other identified influenza virus with other respiratory manifestations: Secondary | ICD-10-CM | POA: Diagnosis not present

## 2023-11-24 DIAGNOSIS — J441 Chronic obstructive pulmonary disease with (acute) exacerbation: Secondary | ICD-10-CM | POA: Diagnosis not present

## 2023-11-24 DIAGNOSIS — J1000 Influenza due to other identified influenza virus with unspecified type of pneumonia: Secondary | ICD-10-CM | POA: Diagnosis not present

## 2023-11-24 DIAGNOSIS — I130 Hypertensive heart and chronic kidney disease with heart failure and stage 1 through stage 4 chronic kidney disease, or unspecified chronic kidney disease: Secondary | ICD-10-CM | POA: Diagnosis not present

## 2023-11-24 DIAGNOSIS — J440 Chronic obstructive pulmonary disease with acute lower respiratory infection: Secondary | ICD-10-CM | POA: Diagnosis not present

## 2023-11-25 MED ORDER — PANTOPRAZOLE SODIUM 40 MG PO TBEC: 40.0000 mg | DELAYED_RELEASE_TABLET | Freq: Every day | ORAL | 3 refills | Status: DC

## 2023-11-25 MED ORDER — METOPROLOL SUCCINATE ER 25 MG PO TB24: 25.0000 mg | ORAL_TABLET | Freq: Every day | ORAL | 1 refills | Status: DC

## 2023-11-26 ENCOUNTER — Telehealth: Payer: Self-pay | Admitting: Family Medicine

## 2023-11-26 ENCOUNTER — Other Ambulatory Visit: Payer: Self-pay

## 2023-11-26 DIAGNOSIS — E1129 Type 2 diabetes mellitus with other diabetic kidney complication: Secondary | ICD-10-CM

## 2023-11-26 DIAGNOSIS — E162 Hypoglycemia, unspecified: Secondary | ICD-10-CM

## 2023-11-26 MED ORDER — DEXCOM G7 SENSOR MISC: 1.0000 | 6 refills | Status: DC

## 2023-11-26 NOTE — Telephone Encounter (Signed)
 Copied from CRM [id number]. Topic: Referral - Request for Referral >> Nov 26, 2023  3:24 PM Kita Perish H wrote: Did the patient discuss referral with their provider in the last year? Yes, (If No - schedule appointment) (If Yes - send message)  Appointment offered? No  Type of order/referral and detailed reason for visit: Speech or swallow therapist  Preference of office, provider, location: [facility] already providing some home services  If referral order, have you been seen by this specialty before? No (If Yes, this issue or another issue? When? Where?  Can we respond through MyChart? Yes

## 2023-11-27 ENCOUNTER — Telehealth: Payer: Self-pay

## 2023-11-27 ENCOUNTER — Ambulatory Visit

## 2023-11-27 DIAGNOSIS — E1129 Type 2 diabetes mellitus with other diabetic kidney complication: Secondary | ICD-10-CM

## 2023-11-27 NOTE — Telephone Encounter (Signed)
 I spoke with pt, she'll come at 10:30 to have staff apply Dexcom.

## 2023-11-27 NOTE — Progress Notes (Signed)
 Patient is in office today for a nurse visit for  replace Dexcom sensor . Dexcom was placed on the back of Left Arm. Patient tolerates well.

## 2023-11-27 NOTE — Telephone Encounter (Signed)
 Copied from CRM [id number]. Topic: Clinical - Medical Advice >> Nov 27, 2023  8:39 AM Lovett Ruck C wrote: Reason for CRM: patient called needing to speak with doctor's nurse. Patient and daughter are unable to get her Dexcom sensor on and she stated nurse usually helps her with this. Please advise

## 2023-11-30 NOTE — Telephone Encounter (Signed)
 Issues addressed at time of OFV 4/23.

## 2023-12-01 DIAGNOSIS — N182 Chronic kidney disease, stage 2 (mild): Secondary | ICD-10-CM | POA: Diagnosis not present

## 2023-12-01 DIAGNOSIS — I214 Non-ST elevation (NSTEMI) myocardial infarction: Secondary | ICD-10-CM | POA: Diagnosis not present

## 2023-12-01 DIAGNOSIS — D509 Iron deficiency anemia, unspecified: Secondary | ICD-10-CM | POA: Diagnosis not present

## 2023-12-01 DIAGNOSIS — I130 Hypertensive heart and chronic kidney disease with heart failure and stage 1 through stage 4 chronic kidney disease, or unspecified chronic kidney disease: Secondary | ICD-10-CM | POA: Diagnosis not present

## 2023-12-01 DIAGNOSIS — E1122 Type 2 diabetes mellitus with diabetic chronic kidney disease: Secondary | ICD-10-CM | POA: Diagnosis not present

## 2023-12-01 DIAGNOSIS — I959 Hypotension, unspecified: Secondary | ICD-10-CM | POA: Diagnosis not present

## 2023-12-01 DIAGNOSIS — D539 Nutritional anemia, unspecified: Secondary | ICD-10-CM | POA: Diagnosis not present

## 2023-12-01 DIAGNOSIS — J449 Chronic obstructive pulmonary disease, unspecified: Secondary | ICD-10-CM | POA: Diagnosis not present

## 2023-12-01 DIAGNOSIS — I5032 Chronic diastolic (congestive) heart failure: Secondary | ICD-10-CM | POA: Diagnosis not present

## 2023-12-02 ENCOUNTER — Telehealth: Payer: Self-pay

## 2023-12-02 NOTE — Telephone Encounter (Signed)
 Copied from CRM [id number]. Topic: Clinical - Medication Question >> Dec 02, 2023 11:06 AM Allyne Areola wrote: Reason for CRM: Anita Mccormick patient's daughter is calling to see if there is any way to get a discount on the Continuous Glucose Sensor (DEXCOM G7 SENSOR) MISC , she is currently paying about $23 but the cost is that she is replacing it every 10 days which is coming out to a higher cost then she originally thought. Best call back number [phone]

## 2023-12-03 NOTE — Telephone Encounter (Signed)
 FYI- Spoke with patient's daughter. Made her aware that there are no cost assistance options at this time for sensors. She confirms that she is paying $23/sensor for dexcom, about $75 per month.  Test claim for freestyle libre 3 plus sensor shows $29.13 per month supply plus a one time charge of $56.07 for the freestyle reader device as patient would not be able to use the freestyle app at no charge.  Daughter will discuss with patient and give us  a call back if they decide to switch to [place] 3 plus sensors.

## 2023-12-07 ENCOUNTER — Ambulatory Visit: Attending: Emergency Medicine | Admitting: Emergency Medicine

## 2023-12-07 ENCOUNTER — Encounter: Payer: Self-pay | Admitting: Emergency Medicine

## 2023-12-07 ENCOUNTER — Ambulatory Visit

## 2023-12-07 VITALS — BP 136/60 | HR 99 | Ht 62.0 in | Wt 144.0 lb

## 2023-12-07 DIAGNOSIS — I2510 Atherosclerotic heart disease of native coronary artery without angina pectoris: Secondary | ICD-10-CM | POA: Diagnosis not present

## 2023-12-07 DIAGNOSIS — I469 Cardiac arrest, cause unspecified: Secondary | ICD-10-CM

## 2023-12-07 DIAGNOSIS — E785 Hyperlipidemia, unspecified: Secondary | ICD-10-CM

## 2023-12-07 DIAGNOSIS — D649 Anemia, unspecified: Secondary | ICD-10-CM | POA: Diagnosis not present

## 2023-12-07 DIAGNOSIS — E1129 Type 2 diabetes mellitus with other diabetic kidney complication: Secondary | ICD-10-CM

## 2023-12-07 DIAGNOSIS — I700 Atherosclerosis of aorta: Secondary | ICD-10-CM

## 2023-12-07 DIAGNOSIS — E119 Type 2 diabetes mellitus without complications: Secondary | ICD-10-CM | POA: Diagnosis not present

## 2023-12-07 DIAGNOSIS — I10 Essential (primary) hypertension: Secondary | ICD-10-CM

## 2023-12-07 DIAGNOSIS — I214 Non-ST elevation (NSTEMI) myocardial infarction: Secondary | ICD-10-CM

## 2023-12-07 DIAGNOSIS — J9601 Acute respiratory failure with hypoxia: Secondary | ICD-10-CM

## 2023-12-07 DIAGNOSIS — I5032 Chronic diastolic (congestive) heart failure: Secondary | ICD-10-CM | POA: Diagnosis not present

## 2023-12-07 NOTE — Patient Instructions (Addendum)
 Medication Instructions:  NO CHANGES  Lab Work: FASTING LIPID PANEL, CMET, AND CBC TO BE DONE TODAY.  Testing/Procedures: NONE  Follow-Up: At [facility], you and your health needs are our priority.  As part of our continuing mission to provide you with exceptional heart care, our providers are all part of one team.  This team includes your primary Cardiologist (physician) and Advanced Practice Providers or APPs (Physician Assistants and Nurse Practitioners) who all work together to provide you with the care you need, when you need it.  Your next appointment:   6 MONTHS  Provider:   Peter Swazil[place], MD

## 2023-12-07 NOTE — Progress Notes (Incomplete)
 Cardiology Office Note:    Date:  12/07/2023  ID:  Anita Mccormick, DOB 12-17-1945, MRN [id number] PCP: Viola Greulich, MD  [facility] Providers Cardiologist:  Peter Swaziland, MD { Click to update primary MD,subspecialty MD or APP then REFRESH:1}    {Click to Open Review  :1}   Patient Profile:      Chief Complaint: Hospital follow-up for NSTEMI History of Present Illness:  Anita Mccormick is a 78 y.o. female with visit-pertinent history of hypertension, hyperlipidemia, CVA, type 2 diabetes, CKD stage II, HFpEF, anemia, breast cancer, NAFLD/cirrhosis, tobacco abuse  Recently admitted from 3/18 - 11/11/2023.  On 10/13/2023 patient presented to ED with worsening SOB, orthopnea, and wheezing.  She was diagnosed with COVID 08/2023.  On presentation she required supplemental oxygen and was found to have influenza A.  Chest x-ray was negative for acute infiltrates.  She was admitted to [facility] and then reportedly had an unwitnessed syncopal event going to the bathroom.  She was found to be hypoxic in the 80s.  Later while awaiting bed placement she was found to be in PEA bradycardic greater than asystolic arrest with ROSC after 3 rounds of ACLS and bicarb. Post ROSC, jaw clenched, opening eyes but otherwise nonresponsive therefore intubated for airway protection.  She was found to have a left pneumothorax for which pigtail catheter was placed.  She also needed vasopressors for shock along with IV antibiotics.  Troponins were 484, 3871, > 24,000.  BNP was 1372.  Subsequently, vasopressors were stopped.  She was extubated on 10/17/2023.  Chest tube was then subsequently removed.  Cardiology was then consulted with a concern for cardiogenic shock.  Echocardiogram completed showing LVEF 60 to 65%, no RWMA, RV function and size normal, normal PASP, trivial MR, moderate MAC.  Cardiac catheterization performed on 10/20/2023 showing three-vessel CAD with proximal LAD 70% stenosis, ramus lesion 75% stenosis, Ost Cx to  proximal Cx is 100% stenosis, ostial RCA to proximal RCA 90% stenosis, proximal RCA and mid RCA has 80% stenosis.  Medical management was recommended as she was not having significant angina and with patient's underlying comorbidities she was not a candidate for CABG and PCI.  She was started on Plavix  with DAPT for x 1 year and metoprolol  succinate.  She was continued on her atorvastatin .   Discussed the use of AI scribe software for clinical note transcription with the patient, who gave verbal consent to proceed.  History of Present Illness Anita Mccormick is a 78 year old female with coronary artery disease who presents for follow-up after recent hospitalization for NSTEMI.  Today patient presents to clinic with her daughter.  Overall patient notes that she feels "fine".  She is without any acute cardiovascular concerns or complaints today.  She denies any chest pains or anginal symptoms.  She knows to be well compensated denies any weight gain or leg swelling.  She notes that her shortness of breath/DOE is stable.  She seems to be maintaining her oxygen saturation on room air.  She denies any SOB at rest, orthopnea, PND.   She is a former smoker, having quit a few months ago, spent most of her life.  She plans to begin physical exercise soon as she has a stair climber at home.  Review of systems:  Please see the history of present illness. All other systems are reviewed and otherwise negative.     Home Medications:    Current Meds  Medication Sig  . acetaminophen  (TYLENOL )  325 MG tablet Take 2 tablets (650 mg total) by mouth every 6 (six) hours as needed for mild pain (pain score 1-3) (or Fever >/= 101).  . albuterol  (VENTOLIN  HFA) 108 (90 Base) MCG/ACT inhaler Inhale 2 puffs into the lungs every 6 (six) hours as needed for wheezing or shortness of breath.  . aspirin  EC 81 MG tablet Take 81 mg by mouth daily. Swallow whole.  . atorvastatin  (LIPITOR ) 80 MG tablet Take 1 tablet (80 mg total)  by mouth daily.  . Blood Pressure Monitor DEVI 1 Device by Does not apply route as directed.  . Budeson-Glycopyrrol-Formoterol  (BREZTRI AEROSPHERE IN) Inhale 2 puffs into the lungs 2 (two) times daily.  . Continuous Glucose Sensor (DEXCOM G7 SENSOR) MISC Apply a new sensor to skin every 10 days.  . ferrous gluconate  (FERGON) 324 MG tablet Take 1 tablet (324 mg total) by mouth daily.  . glipiZIDE  (GLUCOTROL  XL) 10 MG 24 hr tablet TAKE 1 TABLET BY MOUTH TWICE A DAY  . Glucose Blood (BLOOD GLUCOSE TEST STRIPS) STRP 1 each by In Vitro route in the morning, at noon, and at bedtime. May substitute to any manufacturer covered by patient's insurance.  Curvin Downing Device MISC 1 each by Does not apply route in the morning, at noon, and at bedtime. May substitute to any manufacturer covered by patient's insurance.  . metoprolol  succinate (TOPROL -XL) 25 MG 24 hr tablet Take 1 tablet (25 mg total) by mouth daily.  . Multiple Vitamin (MULTIVITAMIN) tablet Take 1 tablet by mouth daily.  . nitroGLYCERIN  (NITROSTAT ) 0.4 MG SL tablet Place 1 tablet (0.4 mg total) under the tongue every 5 (five) minutes as needed for chest pain.  . pantoprazole  (PROTONIX ) 40 MG tablet Take 1 tablet (40 mg total) by mouth daily.   Studies Reviewed:       Cardiac catheterization 10/20/2023 3 vessel obstructive CAD. Suspect culprit vessel is the LCx.  Low LVEDP 4 mm Hg   Plan: recommend medical therapy. She is not having significant angina. She is currently a poor candidate for CABG. Poor targets for PCI.  Diagnostic Dominance: Right   Echocardiogram 10/14/2023 1. Left ventricular ejection fraction, by estimation, is 60 to 65%. The  left ventricle has normal function. The left ventricle has no regional  wall motion abnormalities. Left ventricular diastolic parameters are  indeterminate.   2. Right ventricular systolic function is normal. The right ventricular  size is normal. There is normal pulmonary artery systolic pressure.  The  estimated right ventricular systolic pressure is 15.5 mmHg.   3. The mitral valve is normal in structure. Trivial mitral valve  regurgitation. No evidence of mitral stenosis. Moderate mitral annular  calcification.   4. The aortic valve is normal in structure. Aortic valve regurgitation is  not visualized. No aortic stenosis is present.   5. The inferior vena cava is normal in size with greater than 50%  respiratory variability, suggesting right atrial pressure of 3 mmHg.  Risk Assessment/Calculations:             Physical Exam:   VS:  BP 136/60 (BP Location: Right Arm, Patient Position: Sitting, Cuff Size: Normal)   Pulse 99   Ht 5\' 2"  (1.575 m)   Wt 144 lb (65.3 kg)   SpO2 100%   BMI 26.34 kg/m    Wt Readings from Last 3 Encounters:  12/07/23 144 lb (65.3 kg)  11/18/23 135 lb 9.6 oz (61.5 kg)  11/12/23 133 lb (60.3 kg)    GEN:  Well nourished, well developed in no acute distress NECK: No JVD; No carotid bruits CARDIAC: RRR, no murmurs, rubs, gallops RESPIRATORY:  Clear to auscultation without rales, wheezing or rhonchi  ABDOMEN: Soft, non-tender, non-distended EXTREMITIES:  No edema; No acute deformity     Assessment and Plan:  Coronary artery disease / NSTEMI Diagnostic LHC 09/2023 revealed CAD of multiple native vessels with 100% stenosis of L circumflex and severe stenosis of small RCA branch. No interventions performed during catheterization. With patient's underlying co-morbidities, she is not a candidate for CABG and PCI.  Medical management was recommended for NSTEMI with with DAPT ASA and Plavix  for 1 year Echocardiogram showed LVEF 60 to 65%, no RWMA, RV normal, with no valvular abnormalities - Today patient is without any anginal symptoms, no indication for further ischemic evaluation at this time.  Denies any exertional angina. - Continue aspirin  81 mg daily and Plavix  75 mg daily for 1 year - Continue atorvastatin  80 mg daily - Continue metoprolol  XL 25 mg  daily - BMET today  Acute respiratory failure with hypoxia Influenza A pneumonia Cardiac arrest / PEA COPD exacerbation 2/2 hypoxic respiratory failure (Flu/COPD > Hypoxia)  Hypoxia secondary to influenza A, with initial 2L [place] requirement.  Subsequently had a syncopal event while going to the bathroom which she reported that she occasionally has them.  Later she was found to be more hypoxic in the 80s, then she was found to be in PEA bradycardic > asystolic arrest with ROSC after 3 rounds of ACLS  Treated with Tamiflu , IV antibiotics, steroids, and oral doxycycline  - Today she is stable and saturating well on room air  HFpEF Echocardiogram 3/25 with LVEF 60 to 65%, no RWMA - Today patient is euvolemic and well compensated on exam.  Fluid status stable without the need for loop diuretic therapy - Continue metoprolol  XL 25 mg daily  Anemia Hemoccult positive during recent admission. EGD showed gastritis, nonbleeding gastric and duodenal AVM that were treated with APC.  Colonoscopy showed polyps and nonbleeding internal hemorrhoid.  Received IV iron  and 2 units of blood.  Hemoglobin remained stable.  - GI recommended p.o. Protonix  40 mg twice daily for 8 weeks followed by 40 mg daily - Avoid NSAIDs - CBC today  NAFLD Cirrhosis - Today she appears compensated - Managed by GI  Hypertension Blood pressure today 144/58 and repeat 136/60 Did have asymptomatic hypotension during recent admission.  Her PTA amlodipine  and atenolol  were discontinued - BP today under adequate control.  Patient denies any symptoms concerning for hypotension since discharge - Continue metoprolol  XL 25 mg daily  Pneumothorax Postintubation CXR showed 10% left pneumothorax, chest tube was placed.  Repeat CXR showed resolution. S/p chest tube removal - Today she remained stable  Hyperlipidemia Most recent LDL 57 on 10/2021 - Will repeat fasting lipid panel today - Continue atorvastatin  80 mg daily  History  of breast cancer - Managed by oncology     Dispo:  Return in about 6 months (around 06/08/2024) with primary cardiologist.  Signed, Ava Boatman, NP

## 2023-12-07 NOTE — Progress Notes (Unsigned)
 Cardiology Office Note:    Date:  12/08/2023  ID:  Anita Mccormick, DOB 08-Apr-1946, MRN [id number] PCP: Viola Greulich, MD  [facility] Providers Cardiologist:  Peter Swaziland, MD       Patient Profile:      Chief Complaint: Hospital follow-up for NSTEMI History of Present Illness:  Anita Mccormick is a 78 y.o. female with visit-pertinent history of hypertension, hyperlipidemia, CVA, type 2 diabetes, CKD stage II, HFpEF, anemia, breast cancer, NAFLD/cirrhosis, tobacco abuse  Recently admitted from 3/18 - 11/11/2023.  On 10/13/2023 patient presented to ED with worsening SOB, orthopnea, and wheezing.  She was diagnosed with COVID 08/2023.  On presentation she required supplemental oxygen and was found to have influenza A.  Chest x-ray was negative for acute infiltrates.  She was admitted to [facility] and then reportedly had an unwitnessed syncopal event going to the bathroom.  She was found to be hypoxic in the 80s.  Later while awaiting bed placement she was found to be in PEA bradycardic greater than asystolic arrest with ROSC after 3 rounds of ACLS and bicarb. Post ROSC, jaw clenched, opening eyes but otherwise nonresponsive therefore intubated for airway protection.  She was found to have a left pneumothorax for which pigtail catheter was placed.  She also needed vasopressors for shock along with IV antibiotics.  Troponins were 484, 3871, > 24,000.  BNP was 1372.  Subsequently, vasopressors were stopped.  She was extubated on 10/17/2023.  Chest tube was then subsequently removed.  Cardiology was then consulted with a concern for cardiogenic shock.  Echocardiogram completed showing LVEF 60 to 65%, no RWMA, RV function and size normal, normal PASP, trivial MR, moderate MAC.  Cardiac catheterization performed on 10/20/2023 showing three-vessel CAD with proximal LAD 70% stenosis, ramus lesion 75% stenosis, Ost Cx to proximal Cx is 100% stenosis, ostial RCA to proximal RCA 90% stenosis, proximal RCA and mid RCA  has 80% stenosis.  Medical management was recommended as she was not having significant angina and with patient's underlying comorbidities she was not a candidate for CABG and PCI.  She was started on Plavix  with DAPT for x 1 year and metoprolol  succinate.  She was continued on her atorvastatin .   Discussed the use of AI scribe software for clinical note transcription with the patient, who gave verbal consent to proceed.  History of Present Illness Anita Mccormick is a 78 year old female with coronary artery disease who presents for follow-up after recent hospitalization for NSTEMI.  Today patient presents to clinic with her daughter.  Overall patient notes that she feels "fine".  She is without any acute cardiovascular concerns or complaints today.  She denies any chest pains or anginal symptoms.  She is well compensated and denies any weight gain or leg swelling.  She notes that her shortness of breath/DOE is stable.  She seems to be maintaining her oxygen saturation on room air.  She denies any SOB at rest, orthopnea, PND.   She is a former smoker, having quit a few months ago, spent most of her life.  She plans to begin physical exercise soon as she has a stair climber at home.  Review of systems:  Please see the history of present illness. All other systems are reviewed and otherwise negative.     Home Medications:    Current Meds  Medication Sig   acetaminophen  (TYLENOL ) 325 MG tablet Take 2 tablets (650 mg total) by mouth every 6 (six) hours as needed  for mild pain (pain score 1-3) (or Fever >/= 101).   albuterol  (VENTOLIN  HFA) 108 (90 Base) MCG/ACT inhaler Inhale 2 puffs into the lungs every 6 (six) hours as needed for wheezing or shortness of breath.   aspirin  EC 81 MG tablet Take 81 mg by mouth daily. Swallow whole.   atorvastatin  (LIPITOR ) 80 MG tablet Take 1 tablet (80 mg total) by mouth daily.   Blood Pressure Monitor DEVI 1 Device by Does not apply route as directed.    Budeson-Glycopyrrol-Formoterol  (BREZTRI AEROSPHERE IN) Inhale 2 puffs into the lungs 2 (two) times daily.   Continuous Glucose Sensor (DEXCOM G7 SENSOR) MISC Apply a new sensor to skin every 10 days.   ferrous gluconate  (FERGON) 324 MG tablet Take 1 tablet (324 mg total) by mouth daily.   glipiZIDE  (GLUCOTROL  XL) 10 MG 24 hr tablet TAKE 1 TABLET BY MOUTH TWICE A DAY   Glucose Blood (BLOOD GLUCOSE TEST STRIPS) STRP 1 each by In Vitro route in the morning, at noon, and at bedtime. May substitute to any manufacturer covered by patient's insurance.   Lancet Device MISC 1 each by Does not apply route in the morning, at noon, and at bedtime. May substitute to any manufacturer covered by patient's insurance.   metoprolol  succinate (TOPROL -XL) 25 MG 24 hr tablet Take 1 tablet (25 mg total) by mouth daily.   Multiple Vitamin (MULTIVITAMIN) tablet Take 1 tablet by mouth daily.   nitroGLYCERIN  (NITROSTAT ) 0.4 MG SL tablet Place 1 tablet (0.4 mg total) under the tongue every 5 (five) minutes as needed for chest pain.   pantoprazole  (PROTONIX ) 40 MG tablet Take 1 tablet (40 mg total) by mouth daily.         Studies Reviewed:   Cardiac catheterization 10/20/2023 3 vessel obstructive CAD. Suspect culprit vessel is the LCx.  Low LVEDP 4 mm Hg   Plan: recommend medical therapy. She is not having significant angina. She is currently a poor candidate for CABG. Poor targets for PCI.  Diagnostic Dominance: Right   Echocardiogram 10/14/2023 1. Left ventricular ejection fraction, by estimation, is 60 to 65%. The  left ventricle has normal function. The left ventricle has no regional  wall motion abnormalities. Left ventricular diastolic parameters are  indeterminate.   2. Right ventricular systolic function is normal. The right ventricular  size is normal. There is normal pulmonary artery systolic pressure. The  estimated right ventricular systolic pressure is 15.5 mmHg.   3. The mitral valve is normal in  structure. Trivial mitral valve  regurgitation. No evidence of mitral stenosis. Moderate mitral annular  calcification.   4. The aortic valve is normal in structure. Aortic valve regurgitation is  not visualized. No aortic stenosis is present.   5. The inferior vena cava is normal in size with greater than 50%  respiratory variability, suggesting right atrial pressure of 3 mmHg.  Risk Assessment/Calculations:             Physical Exam:   VS:  BP 136/60 (BP Location: Right Arm, Patient Position: Sitting, Cuff Size: Normal)   Pulse 99   Ht 5\' 2"  (1.575 m)   Wt 144 lb (65.3 kg)   SpO2 100%   BMI 26.34 kg/m    Wt Readings from Last 3 Encounters:  12/07/23 144 lb (65.3 kg)  11/18/23 135 lb 9.6 oz (61.5 kg)  11/12/23 133 lb (60.3 kg)    GEN: Well nourished, well developed in no acute distress NECK: No JVD; No carotid bruits CARDIAC:  RRR, no murmurs, rubs, gallops RESPIRATORY:  Clear to auscultation without rales, wheezing or rhonchi  ABDOMEN: Soft, non-tender, non-distended EXTREMITIES:  No edema; No acute deformity     Assessment and Plan:  Coronary artery disease / NSTEMI Diagnostic LHC 09/2023 revealed CAD of multiple native vessels with 100% stenosis of left circumflex, 70% stenosis to proximal LAD, 75% stenosis to ramus intermedius, and 90% stenosis to ostial RCA to proximal RCA. No interventions performed during catheterization due to patient's underlying co-morbidities and no significant angina, She is not a candidate for CABG and PCI.  Medical management was recommended for NSTEMI with DAPT ASA and Plavix  for 1 year Echocardiogram showed LVEF 60 to 65%, no RWMA, RV normal, with no valvular abnormalities - Today patient is without any anginal symptoms, no indication for further ischemic evaluation at this time.  Denies any exertional angina. - Continue aspirin  81 mg daily and Plavix  75 mg daily for 1 year - Continue atorvastatin  80 mg daily - Continue metoprolol  XL 25 mg  daily - BMET today  Acute respiratory failure with hypoxia Influenza A pneumonia Cardiac arrest / PEA COPD exacerbation Cardiac arrest 2/2 hypoxic respiratory failure (Flu/COPD > Hypoxia)  Hypoxia was secondary to influenza A, with initial 2L [place] requirement.  During admission she had a syncopal event while going to the bathroom which she reported that she occasionally has them.  Later she was found to be hypoxic in the 80s, then she was found to be in PEA bradycardic > asystolic arrest with ROSC after 3 rounds of ACLS  Treated with Tamiflu , IV antibiotics, steroids, and oral doxycycline  - Today she is stable and saturating well on room air. She continues with mild tenderness to chest wall that is improving   HFpEF Echocardiogram 09/2023 with LVEF 60 to 65%, no RWMA - Today patient is euvolemic and well compensated on exam.  Fluid status stable without the need for loop diuretic therapy - Continue metoprolol  XL 25 mg daily  Anemia Hemoccult positive during recent admission. EGD showed gastritis, nonbleeding gastric and duodenal AVM that were treated with APC.  Colonoscopy showed polyps and nonbleeding internal hemorrhoid.  Received IV iron  and 2 units of blood.  Hemoglobin remained stable.  - GI recommended p.o. Protonix  40 mg twice daily for 8 weeks followed by 40 mg daily - Avoid NSAIDs - CBC today  NAFLD Cirrhosis - Today she appears compensated - Managed by GI  Hypertension Blood pressure today 144/58 and repeat 136/60 Did have asymptomatic hypotension during recent admission.  Her PTA amlodipine  and atenolol  were discontinued - BP today under adequate control.  Patient denies any symptoms concerning for hypotension since discharge - Continue metoprolol  XL 25 mg daily  Pneumothorax Postintubation CXR showed 10% left pneumothorax, chest tube was placed.  Repeat CXR showed resolution. S/p chest tube removal - Today she remains stable  Hyperlipidemia Most recent LDL 57 on  10/2021 - Will repeat fasting lipid panel today - Continue atorvastatin  80 mg daily  History of breast cancer - Managed by oncology     Dispo:  Return in about 6 months (around 06/08/2024) with primary cardiologist.  Signed, Ava Boatman, NP

## 2023-12-07 NOTE — Progress Notes (Signed)
Patient presents for a nurses visit to remove and replace the Dexcom G7 sensor.  Patient did not have her own sensor and requested a sample.  PCP was informed, approved the sample  and this was placed on the right arm.

## 2023-12-08 ENCOUNTER — Encounter: Payer: Self-pay | Admitting: Emergency Medicine

## 2023-12-08 ENCOUNTER — Telehealth: Payer: Self-pay

## 2023-12-08 DIAGNOSIS — J449 Chronic obstructive pulmonary disease, unspecified: Secondary | ICD-10-CM | POA: Diagnosis not present

## 2023-12-08 DIAGNOSIS — I214 Non-ST elevation (NSTEMI) myocardial infarction: Secondary | ICD-10-CM | POA: Diagnosis not present

## 2023-12-08 DIAGNOSIS — I959 Hypotension, unspecified: Secondary | ICD-10-CM | POA: Diagnosis not present

## 2023-12-08 DIAGNOSIS — I5032 Chronic diastolic (congestive) heart failure: Secondary | ICD-10-CM | POA: Diagnosis not present

## 2023-12-08 DIAGNOSIS — I130 Hypertensive heart and chronic kidney disease with heart failure and stage 1 through stage 4 chronic kidney disease, or unspecified chronic kidney disease: Secondary | ICD-10-CM | POA: Diagnosis not present

## 2023-12-08 DIAGNOSIS — E1122 Type 2 diabetes mellitus with diabetic chronic kidney disease: Secondary | ICD-10-CM | POA: Diagnosis not present

## 2023-12-08 DIAGNOSIS — N182 Chronic kidney disease, stage 2 (mild): Secondary | ICD-10-CM | POA: Diagnosis not present

## 2023-12-08 DIAGNOSIS — D539 Nutritional anemia, unspecified: Secondary | ICD-10-CM | POA: Diagnosis not present

## 2023-12-08 DIAGNOSIS — D509 Iron deficiency anemia, unspecified: Secondary | ICD-10-CM | POA: Diagnosis not present

## 2023-12-08 LAB — LIPID PANEL

## 2023-12-08 NOTE — Telephone Encounter (Signed)
 Ok

## 2023-12-08 NOTE — Telephone Encounter (Signed)
 Copied from CRM [id number]. Topic: Clinical - Home Health Verbal Orders >> Dec 08, 2023 10:55 AM Juluis Ok wrote: Caller/Agency: [facility] Callback Number: [phone] Service Requested: Occupational Therapy Frequency: 1X4 weeks Any new concerns about the patient? No

## 2023-12-09 ENCOUNTER — Other Ambulatory Visit: Payer: Self-pay | Admitting: Family Medicine

## 2023-12-09 ENCOUNTER — Ambulatory Visit: Payer: Self-pay | Admitting: Emergency Medicine

## 2023-12-09 DIAGNOSIS — E785 Hyperlipidemia, unspecified: Secondary | ICD-10-CM

## 2023-12-09 DIAGNOSIS — I10 Essential (primary) hypertension: Secondary | ICD-10-CM

## 2023-12-09 DIAGNOSIS — Z8673 Personal history of transient ischemic attack (TIA), and cerebral infarction without residual deficits: Secondary | ICD-10-CM

## 2023-12-09 DIAGNOSIS — E1129 Type 2 diabetes mellitus with other diabetic kidney complication: Secondary | ICD-10-CM

## 2023-12-09 DIAGNOSIS — I214 Non-ST elevation (NSTEMI) myocardial infarction: Secondary | ICD-10-CM

## 2023-12-09 LAB — CBC
Hematocrit: 25.9 % — ABNORMAL LOW (ref 34.0–46.6)
Hemoglobin: 8 g/dL — ABNORMAL LOW (ref 11.1–15.9)
MCH: 31.7 pg (ref 26.6–33.0)
MCHC: 30.9 g/dL — ABNORMAL LOW (ref 31.5–35.7)
MCV: 103 fL — ABNORMAL HIGH (ref 79–97)
Platelets: 132 10*3/uL — ABNORMAL LOW (ref 150–450)
RBC: 2.52 x10E6/uL — CL (ref 3.77–5.28)
RDW: 14.5 % (ref 11.7–15.4)
WBC: 4.7 10*3/uL (ref 3.4–10.8)

## 2023-12-09 LAB — COMPREHENSIVE METABOLIC PANEL WITH GFR
ALT: 24 IU/L (ref 0–32)
AST: 25 IU/L (ref 0–40)
Albumin: 3.7 g/dL — ABNORMAL LOW (ref 3.8–4.8)
Alkaline Phosphatase: 153 IU/L — ABNORMAL HIGH (ref 44–121)
BUN/Creatinine Ratio: 13 (ref 12–28)
BUN: 13 mg/dL (ref 8–27)
Bilirubin Total: 0.5 mg/dL (ref 0.0–1.2)
CO2: 21 mmol/L (ref 20–29)
Calcium: 9.3 mg/dL (ref 8.7–10.3)
Chloride: 105 mmol/L (ref 96–106)
Creatinine, Ser: 1.01 mg/dL — ABNORMAL HIGH (ref 0.57–1.00)
Globulin, Total: 2.3 g/dL (ref 1.5–4.5)
Glucose: 307 mg/dL — ABNORMAL HIGH (ref 70–99)
Potassium: 4.2 mmol/L (ref 3.5–5.2)
Sodium: 141 mmol/L (ref 134–144)
Total Protein: 6 g/dL (ref 6.0–8.5)
eGFR: 57 mL/min/{1.73_m2} — ABNORMAL LOW (ref >59)

## 2023-12-09 LAB — LIPID PANEL
Cholesterol, Total: 103 mg/dL (ref 100–199)
HDL: 66 mg/dL (ref >39)
LDL CALC COMMENT:: 1.6 ratio (ref 0.0–4.4)
LDL Chol Calc (NIH): 24 mg/dL (ref 0–99)
Triglycerides: 53 mg/dL (ref 0–149)
VLDL Cholesterol Cal: 13 mg/dL (ref 5–40)

## 2023-12-09 MED ORDER — ATORVASTATIN CALCIUM 80 MG PO TABS
80.0000 mg | ORAL_TABLET | Freq: Every day | ORAL | 3 refills | Status: DC
Start: 2023-12-09 — End: 2024-04-01

## 2023-12-09 NOTE — Telephone Encounter (Signed)
 Spoke with Trevor Fudge from [facility], Gave VO for OT Per Dr. Arliss Lam

## 2023-12-09 NOTE — Telephone Encounter (Signed)
Matter previously addressed. 

## 2023-12-14 ENCOUNTER — Encounter: Payer: Self-pay | Admitting: Family Medicine

## 2023-12-14 MED ORDER — CLOPIDOGREL BISULFATE 75 MG PO TABS: 75.0000 mg | ORAL_TABLET | Freq: Every day | ORAL | 2 refills | Status: DC

## 2023-12-14 MED ORDER — METOPROLOL SUCCINATE ER 25 MG PO TB24: 25.0000 mg | ORAL_TABLET | Freq: Every day | ORAL | 1 refills | Status: DC

## 2023-12-14 MED ORDER — PANTOPRAZOLE SODIUM 40 MG PO TBEC: 40.0000 mg | DELAYED_RELEASE_TABLET | Freq: Every day | ORAL | 3 refills | Status: AC

## 2023-12-15 ENCOUNTER — Telehealth: Payer: Self-pay

## 2023-12-15 ENCOUNTER — Other Ambulatory Visit: Payer: Self-pay | Admitting: Family Medicine

## 2023-12-15 DIAGNOSIS — E162 Hypoglycemia, unspecified: Secondary | ICD-10-CM

## 2023-12-15 DIAGNOSIS — E1129 Type 2 diabetes mellitus with other diabetic kidney complication: Secondary | ICD-10-CM

## 2023-12-15 NOTE — Telephone Encounter (Signed)
 Copied from CRM [id number]. Topic: Clinical - Medication Question >> Dec 14, 2023  4:03 PM Adonis Hoot wrote: Reason for CRM: [facility] called in stating that the patient is requesting atenolol  medication be filled.I advised [facility] to have patient call office due to this medication not being listed on her med list as a medication that she takes

## 2023-12-16 DIAGNOSIS — I5032 Chronic diastolic (congestive) heart failure: Secondary | ICD-10-CM | POA: Diagnosis not present

## 2023-12-16 DIAGNOSIS — I214 Non-ST elevation (NSTEMI) myocardial infarction: Secondary | ICD-10-CM | POA: Diagnosis not present

## 2023-12-16 DIAGNOSIS — J449 Chronic obstructive pulmonary disease, unspecified: Secondary | ICD-10-CM | POA: Diagnosis not present

## 2023-12-16 DIAGNOSIS — D539 Nutritional anemia, unspecified: Secondary | ICD-10-CM | POA: Diagnosis not present

## 2023-12-16 DIAGNOSIS — E1122 Type 2 diabetes mellitus with diabetic chronic kidney disease: Secondary | ICD-10-CM | POA: Diagnosis not present

## 2023-12-16 DIAGNOSIS — I959 Hypotension, unspecified: Secondary | ICD-10-CM | POA: Diagnosis not present

## 2023-12-16 DIAGNOSIS — I130 Hypertensive heart and chronic kidney disease with heart failure and stage 1 through stage 4 chronic kidney disease, or unspecified chronic kidney disease: Secondary | ICD-10-CM | POA: Diagnosis not present

## 2023-12-16 DIAGNOSIS — N182 Chronic kidney disease, stage 2 (mild): Secondary | ICD-10-CM | POA: Diagnosis not present

## 2023-12-16 DIAGNOSIS — D509 Iron deficiency anemia, unspecified: Secondary | ICD-10-CM | POA: Diagnosis not present

## 2023-12-16 NOTE — Telephone Encounter (Signed)
 Patient taking Toprol  XL 25 mg.

## 2023-12-17 DIAGNOSIS — N182 Chronic kidney disease, stage 2 (mild): Secondary | ICD-10-CM | POA: Diagnosis not present

## 2023-12-17 DIAGNOSIS — I5032 Chronic diastolic (congestive) heart failure: Secondary | ICD-10-CM | POA: Diagnosis not present

## 2023-12-17 DIAGNOSIS — E1122 Type 2 diabetes mellitus with diabetic chronic kidney disease: Secondary | ICD-10-CM | POA: Diagnosis not present

## 2023-12-17 DIAGNOSIS — D509 Iron deficiency anemia, unspecified: Secondary | ICD-10-CM | POA: Diagnosis not present

## 2023-12-17 DIAGNOSIS — I214 Non-ST elevation (NSTEMI) myocardial infarction: Secondary | ICD-10-CM | POA: Diagnosis not present

## 2023-12-17 DIAGNOSIS — I959 Hypotension, unspecified: Secondary | ICD-10-CM | POA: Diagnosis not present

## 2023-12-17 DIAGNOSIS — I130 Hypertensive heart and chronic kidney disease with heart failure and stage 1 through stage 4 chronic kidney disease, or unspecified chronic kidney disease: Secondary | ICD-10-CM | POA: Diagnosis not present

## 2023-12-17 DIAGNOSIS — D539 Nutritional anemia, unspecified: Secondary | ICD-10-CM | POA: Diagnosis not present

## 2023-12-17 DIAGNOSIS — J449 Chronic obstructive pulmonary disease, unspecified: Secondary | ICD-10-CM | POA: Diagnosis not present

## 2023-12-18 ENCOUNTER — Telehealth: Payer: Self-pay

## 2023-12-18 NOTE — Telephone Encounter (Signed)
 Contacted Anita Mccormick from [facility], gave the VO per Dr. Arliss Lam

## 2023-12-18 NOTE — Telephone Encounter (Signed)
 Copied from CRM [id number]. Topic: Clinical - Home Health Verbal Orders >> Dec 18, 2023 12:23 PM Lajean Pike wrote: Caller/Agency: [facility]  Callback Number: [phone] Service Requested: Nurse to come in to work with patient to assist with blood glucose machine and monitoring.  Frequency:  Any new concerns about the patient? No

## 2023-12-23 ENCOUNTER — Emergency Department (HOSPITAL_COMMUNITY)

## 2023-12-23 ENCOUNTER — Telehealth: Payer: Self-pay | Admitting: Family Medicine

## 2023-12-23 ENCOUNTER — Encounter: Payer: Self-pay | Admitting: Family Medicine

## 2023-12-23 ENCOUNTER — Observation Stay (HOSPITAL_COMMUNITY)
Admission: EM | Admit: 2023-12-23 | Discharge: 2023-12-27 | Disposition: A | Discharge status: Home / Self Care | Attending: Internal Medicine | Admitting: Internal Medicine

## 2023-12-23 ENCOUNTER — Other Ambulatory Visit: Payer: Self-pay

## 2023-12-23 ENCOUNTER — Ambulatory Visit (INDEPENDENT_AMBULATORY_CARE_PROVIDER_SITE_OTHER): Admitting: Family Medicine

## 2023-12-23 ENCOUNTER — Encounter (HOSPITAL_COMMUNITY): Payer: Self-pay | Admitting: *Deleted

## 2023-12-23 ENCOUNTER — Telehealth: Payer: Self-pay

## 2023-12-23 VITALS — BP 132/70 | HR 101 | Temp 98.9°F | Ht 62.0 in | Wt 146.6 lb

## 2023-12-23 DIAGNOSIS — K5730 Diverticulosis of large intestine without perforation or abscess without bleeding: Secondary | ICD-10-CM | POA: Diagnosis not present

## 2023-12-23 DIAGNOSIS — K922 Gastrointestinal hemorrhage, unspecified: Secondary | ICD-10-CM | POA: Insufficient documentation

## 2023-12-23 DIAGNOSIS — D509 Iron deficiency anemia, unspecified: Secondary | ICD-10-CM | POA: Diagnosis not present

## 2023-12-23 DIAGNOSIS — E785 Hyperlipidemia, unspecified: Secondary | ICD-10-CM | POA: Insufficient documentation

## 2023-12-23 DIAGNOSIS — I5032 Chronic diastolic (congestive) heart failure: Secondary | ICD-10-CM | POA: Diagnosis not present

## 2023-12-23 DIAGNOSIS — K219 Gastro-esophageal reflux disease without esophagitis: Secondary | ICD-10-CM | POA: Diagnosis not present

## 2023-12-23 DIAGNOSIS — D500 Iron deficiency anemia secondary to blood loss (chronic): Principal | ICD-10-CM | POA: Insufficient documentation

## 2023-12-23 DIAGNOSIS — R0989 Other specified symptoms and signs involving the circulatory and respiratory systems: Secondary | ICD-10-CM | POA: Diagnosis not present

## 2023-12-23 DIAGNOSIS — E1122 Type 2 diabetes mellitus with diabetic chronic kidney disease: Secondary | ICD-10-CM | POA: Insufficient documentation

## 2023-12-23 DIAGNOSIS — D649 Anemia, unspecified: Secondary | ICD-10-CM | POA: Diagnosis not present

## 2023-12-23 DIAGNOSIS — R0789 Other chest pain: Secondary | ICD-10-CM | POA: Diagnosis not present

## 2023-12-23 DIAGNOSIS — I130 Hypertensive heart and chronic kidney disease with heart failure and stage 1 through stage 4 chronic kidney disease, or unspecified chronic kidney disease: Secondary | ICD-10-CM | POA: Insufficient documentation

## 2023-12-23 DIAGNOSIS — Z1152 Encounter for screening for COVID-19: Secondary | ICD-10-CM | POA: Diagnosis not present

## 2023-12-23 DIAGNOSIS — R918 Other nonspecific abnormal finding of lung field: Secondary | ICD-10-CM | POA: Diagnosis not present

## 2023-12-23 DIAGNOSIS — I214 Non-ST elevation (NSTEMI) myocardial infarction: Secondary | ICD-10-CM | POA: Diagnosis not present

## 2023-12-23 DIAGNOSIS — D539 Nutritional anemia, unspecified: Secondary | ICD-10-CM | POA: Diagnosis not present

## 2023-12-23 DIAGNOSIS — Z8673 Personal history of transient ischemic attack (TIA), and cerebral infarction without residual deficits: Secondary | ICD-10-CM | POA: Insufficient documentation

## 2023-12-23 DIAGNOSIS — K7460 Unspecified cirrhosis of liver: Secondary | ICD-10-CM | POA: Diagnosis not present

## 2023-12-23 DIAGNOSIS — R0602 Shortness of breath: Secondary | ICD-10-CM

## 2023-12-23 DIAGNOSIS — F17210 Nicotine dependence, cigarettes, uncomplicated: Secondary | ICD-10-CM | POA: Diagnosis not present

## 2023-12-23 DIAGNOSIS — Z7985 Long-term (current) use of injectable non-insulin antidiabetic drugs: Secondary | ICD-10-CM

## 2023-12-23 DIAGNOSIS — I2510 Atherosclerotic heart disease of native coronary artery without angina pectoris: Secondary | ICD-10-CM | POA: Insufficient documentation

## 2023-12-23 DIAGNOSIS — K5520 Angiodysplasia of colon without hemorrhage: Secondary | ICD-10-CM

## 2023-12-23 DIAGNOSIS — Q2733 Arteriovenous malformation of digestive system vessel: Secondary | ICD-10-CM | POA: Diagnosis not present

## 2023-12-23 DIAGNOSIS — K429 Umbilical hernia without obstruction or gangrene: Secondary | ICD-10-CM | POA: Diagnosis not present

## 2023-12-23 DIAGNOSIS — Z79899 Other long term (current) drug therapy: Secondary | ICD-10-CM | POA: Diagnosis not present

## 2023-12-23 DIAGNOSIS — I5033 Acute on chronic diastolic (congestive) heart failure: Secondary | ICD-10-CM | POA: Insufficient documentation

## 2023-12-23 DIAGNOSIS — E1129 Type 2 diabetes mellitus with other diabetic kidney complication: Secondary | ICD-10-CM

## 2023-12-23 DIAGNOSIS — Z7984 Long term (current) use of oral hypoglycemic drugs: Secondary | ICD-10-CM | POA: Diagnosis not present

## 2023-12-23 DIAGNOSIS — J449 Chronic obstructive pulmonary disease, unspecified: Secondary | ICD-10-CM | POA: Diagnosis not present

## 2023-12-23 DIAGNOSIS — K31819 Angiodysplasia of stomach and duodenum without bleeding: Secondary | ICD-10-CM

## 2023-12-23 DIAGNOSIS — I959 Hypotension, unspecified: Secondary | ICD-10-CM | POA: Diagnosis not present

## 2023-12-23 DIAGNOSIS — Z7901 Long term (current) use of anticoagulants: Secondary | ICD-10-CM | POA: Insufficient documentation

## 2023-12-23 DIAGNOSIS — N182 Chronic kidney disease, stage 2 (mild): Secondary | ICD-10-CM | POA: Diagnosis not present

## 2023-12-23 LAB — CBC WITH DIFFERENTIAL/PLATELET
Abs Immature Granulocytes: 0.02 K/uL (ref 0.00–0.07)
Basophils Absolute: 0 10*3/uL (ref 0.0–0.1)
Basophils Absolute: 0 K/uL (ref 0.0–0.1)
Basophils Relative: 0.9 % (ref 0.0–3.0)
Basophils Relative: 1 %
Eosinophils Absolute: 0.1 10*3/uL (ref 0.0–0.7)
Eosinophils Absolute: 0.2 K/uL (ref 0.0–0.5)
Eosinophils Relative: 3.9 % (ref 0.0–5.0)
Eosinophils Relative: 5 %
HCT: 19.1 % — CL (ref 36.0–46.0)
HCT: 18.5 % — ABNORMAL LOW (ref 36.0–46.0)
Hemoglobin: 5.9 g/dL — CL (ref 12.0–15.0)
Hemoglobin: 5.4 g/dL — CL (ref 12.0–15.0)
Immature Granulocytes: 1 %
Lymphocytes Relative: 13.2 % (ref 12.0–46.0)
Lymphocytes Relative: 16 %
Lymphs Abs: 0.5 10*3/uL — ABNORMAL LOW (ref 0.7–4.0)
Lymphs Abs: 0.6 K/uL — ABNORMAL LOW (ref 0.7–4.0)
MCH: 30.3 pg (ref 26.0–34.0)
MCHC: 30.8 g/dL (ref 30.0–36.0)
MCHC: 29.2 g/dL — ABNORMAL LOW (ref 30.0–36.0)
MCV: 97 fl (ref 78.0–100.0)
MCV: 103.9 fL — ABNORMAL HIGH (ref 80.0–100.0)
Monocytes Absolute: 0.5 10*3/uL (ref 0.1–1.0)
Monocytes Absolute: 0.6 K/uL (ref 0.1–1.0)
Monocytes Relative: 11.7 % (ref 3.0–12.0)
Monocytes Relative: 15 %
Neutro Abs: 2.7 10*3/uL (ref 1.4–7.7)
Neutro Abs: 2.4 K/uL (ref 1.7–7.7)
Neutrophils Relative %: 70.3 % (ref 43.0–77.0)
Neutrophils Relative %: 62 %
Platelets: 114 10*3/uL — ABNORMAL LOW (ref 150.0–400.0)
Platelets: 108 K/uL — ABNORMAL LOW (ref 150–400)
RBC: 1.97 Mil/uL — ABNORMAL LOW (ref 3.87–5.11)
RBC: 1.78 MIL/uL — ABNORMAL LOW (ref 3.87–5.11)
RDW: 15.8 % — ABNORMAL HIGH (ref 11.5–15.5)
RDW: 15.1 % (ref 11.5–15.5)
WBC: 3.8 10*3/uL — ABNORMAL LOW (ref 4.0–10.5)
WBC: 3.9 K/uL — ABNORMAL LOW (ref 4.0–10.5)
nRBC: 0 % (ref 0.0–0.2)

## 2023-12-23 LAB — BASIC METABOLIC PANEL WITH GFR
Anion gap: 11 (ref 5–15)
BUN: 13 mg/dL (ref 8–23)
CO2: 20 mmol/L — ABNORMAL LOW (ref 22–32)
Calcium: 8.9 mg/dL (ref 8.9–10.3)
Chloride: 107 mmol/L (ref 98–111)
Creatinine, Ser: 1.13 mg/dL — ABNORMAL HIGH (ref 0.44–1.00)
GFR, Estimated: 50 mL/min — ABNORMAL LOW (ref >60)
Glucose, Bld: 439 mg/dL — ABNORMAL HIGH (ref 70–99)
Potassium: 4.2 mmol/L (ref 3.5–5.1)
Sodium: 138 mmol/L (ref 135–145)

## 2023-12-23 LAB — T4, FREE: Free T4: 0.96 ng/dL (ref 0.60–1.60)

## 2023-12-23 LAB — BRAIN NATRIURETIC PEPTIDE
B Natriuretic Peptide: 912.6 pg/mL — ABNORMAL HIGH (ref 0.0–100.0)
Pro B Natriuretic peptide (BNP): 880 pg/mL — ABNORMAL HIGH (ref 0.0–100.0)

## 2023-12-23 LAB — RESP PANEL BY RT-PCR (RSV, FLU A&B, COVID)  RVPGX2
Influenza A by PCR: NEGATIVE
Influenza B by PCR: NEGATIVE
Resp Syncytial Virus by PCR: NEGATIVE
SARS Coronavirus 2 by RT PCR: NEGATIVE

## 2023-12-23 LAB — TROPONIN I (HIGH SENSITIVITY)
Troponin I (High Sensitivity): 99 ng/L — ABNORMAL HIGH (ref <18)
Troponin I (High Sensitivity): 81 ng/L — ABNORMAL HIGH (ref <18)

## 2023-12-23 LAB — COMPREHENSIVE METABOLIC PANEL WITH GFR
ALT: 15 U/L (ref 0–35)
AST: 17 U/L (ref 0–37)
Albumin: 3.6 g/dL (ref 3.5–5.2)
Alkaline Phosphatase: 108 U/L (ref 39–117)
BUN: 15 mg/dL (ref 6–23)
CO2: 25 meq/L (ref 19–32)
Calcium: 9 mg/dL (ref 8.4–10.5)
Chloride: 105 meq/L (ref 96–112)
Creatinine, Ser: 0.98 mg/dL (ref 0.40–1.20)
GFR: 55.81 mL/min — ABNORMAL LOW (ref >60.00)
Glucose, Bld: 366 mg/dL — ABNORMAL HIGH (ref 70–99)
Potassium: 4 meq/L (ref 3.5–5.1)
Sodium: 136 meq/L (ref 135–145)
Total Bilirubin: 0.7 mg/dL (ref 0.2–1.2)
Total Protein: 6 g/dL (ref 6.0–8.3)

## 2023-12-23 LAB — PROTIME-INR
INR: 1.2 (ref 0.8–1.2)
Prothrombin Time: 14.9 s (ref 11.4–15.2)

## 2023-12-23 LAB — POC OCCULT BLOOD, ED: Fecal Occult Bld: POSITIVE — AB

## 2023-12-23 LAB — APTT: aPTT: 24 s (ref 24–36)

## 2023-12-23 LAB — PREPARE RBC (CROSSMATCH)

## 2023-12-23 LAB — TSH: TSH: 1.17 u[IU]/mL (ref 0.35–5.50)

## 2023-12-23 MED ORDER — SODIUM CHLORIDE 0.9% FLUSH: 10.0000 mL | INTRAVENOUS | Status: DC | PRN

## 2023-12-23 MED ORDER — SODIUM CHLORIDE 0.9 % IV BOLUS: 500.0000 mL | Freq: Once | INTRAVENOUS | Status: DC

## 2023-12-23 MED ORDER — SODIUM CHLORIDE 0.9% FLUSH
10.0000 mL | Freq: Two times a day (BID) | INTRAVENOUS | Status: DC
  Administered 2023-12-23 – 2023-12-27 (×7): 10 mL

## 2023-12-23 MED ORDER — FAMOTIDINE IN NACL 20-0.9 MG/50ML-% IV SOLN: 20.0000 mg | Freq: Once | INTRAVENOUS | Status: DC

## 2023-12-23 MED ORDER — SODIUM CHLORIDE 0.9% IV SOLUTION
Freq: Once | INTRAVENOUS | Status: AC
Start: 2023-12-23 — End: 2023-12-24

## 2023-12-23 MED ORDER — IOHEXOL 350 MG/ML SOLN
70.0000 mL | Freq: Once | INTRAVENOUS | Status: AC | PRN
  Administered 2023-12-23: 70 mL via INTRAVENOUS

## 2023-12-23 NOTE — ED Provider Notes (Signed)
 [place] EMERGENCY DEPARTMENT AT [facility] Provider Note   CSN: [id number] Arrival date & time: 12/23/23  1719     History {Add pertinent medical, surgical, social history, OB history to HPI:1} No chief complaint on file.   Anita Mccormick is a 78 y.o. female.  HPI Patient is a 78 year old female presents the ED today with complaints of shortness of breath x 2 weeks..  Medical history of type 2 diabetes, CVA, HTN, CAD, iron  deficiency anemia requiring transfusions.  Noticed that it was worse last night.  Saw PCP today and was told that her hemoglobin was critically low and told to come to the ED.  Reports that she has had some congestion as well suspect the shortness of breath might be secondary to cold.  Also states that she has been having some continued chest wall pain and tightness after "having ribs broken in the hospital last time."   Currently on Plavix , reporting no missed doses, being very faithful to take the medication.  Reports lower leg swelling noticed recently with the last 2 days, and also reports baseline black stools due to taking iron .  Denies fever, headache, vision changes, abdominal pain, nausea, vomiting, diarrhea, dysuria, hematochezia    Home Medications Prior to Admission medications   Medication Sig Start Date End Date Taking? Authorizing Provider  acetaminophen  (TYLENOL ) 325 MG tablet Take 2 tablets (650 mg total) by mouth every 6 (six) hours as needed for mild pain (pain score 1-3) (or Fever >/= 101). 10/27/23   Maylene Spear, MD  albuterol  (VENTOLIN  HFA) 108 [phone] Base) MCG/ACT inhaler Inhale 2 puffs into the lungs every 6 (six) hours as needed for wheezing or shortness of breath. 11/11/23   Gonfa, Taye T, MD  aspirin  EC 81 MG tablet Take 81 mg by mouth daily. Swallow whole.    [provider]  atorvastatin  (LIPITOR ) 80 MG tablet Take 1 tablet (80 mg total) by mouth daily. Patient not taking: Reported on 12/07/2023 10/27/23   Krishnan,  Gokul, MD  atorvastatin  (LIPITOR ) 80 MG tablet Take 1 tablet (80 mg total) by mouth daily. 12/09/23   Viola Greulich, MD  Budeson-Glycopyrrol-Formoterol  (BREZTRI AEROSPHERE IN) Inhale 2 puffs into the lungs 2 (two) times daily.    [provider]  clopidogrel  (PLAVIX ) 75 MG tablet Take 1 tablet (75 mg total) by mouth daily. 12/14/23   Viola Greulich, MD  Continuous Glucose Sensor (DEXCOM G7 SENSOR) MISC Apply a new sensor to skin every 10 days. 11/26/23   Viola Greulich, MD  Dulaglutide  (TRULICITY ) 1.5 MG/0.5ML SOAJ Inject 1.5 mg into the skin once a week. 12/23/23   [provider]  ferrous gluconate  (FERGON) 324 MG tablet Take 1 tablet (324 mg total) by mouth daily. 04/23/23   Viola Greulich, MD  glipiZIDE  (GLUCOTROL  XL) 10 MG 24 hr tablet TAKE 1 TABLET TWICE DAILY 12/09/23   Viola Greulich, MD  Glucose Blood (BLOOD GLUCOSE TEST STRIPS) STRP 1 each by In Vitro route in the morning, at noon, and at bedtime. May substitute to any manufacturer covered by patient's insurance. 11/18/23   Viola Greulich, MD  Lancets [facility] ULTRASOFT) lancets Use as instructed 07/07/22   Viola Greulich, MD  Lancets Misc. MISC 1 each by Does not apply route in the morning, at noon, and at bedtime. May substitute to any manufacturer covered by patient's insurance. 11/18/23   Viola Greulich, MD  metoprolol  succinate (TOPROL -XL) 25 MG 24 hr tablet Take 1  tablet (25 mg total) by mouth daily. 12/14/23   Viola Greulich, MD  mometasone -formoterol  (DULERA ) 200-5 MCG/ACT AERO Inhale 2 puffs into the lungs 2 (two) times daily. Patient not taking: Reported on 12/07/2023 11/11/23   Gonfa, Taye T, MD  Multiple Vitamin (MULTIVITAMIN) tablet Take 1 tablet by mouth daily.    [provider]  nitroGLYCERIN  (NITROSTAT ) 0.4 MG SL tablet Place 1 tablet (0.4 mg total) under the tongue every 5 (five) minutes as needed for chest pain. 11/18/23 11/17/24  Viola Greulich, MD  pantoprazole  (PROTONIX ) 40 MG tablet  Take 1 tablet (40 mg total) by mouth daily. 12/14/23   Viola Greulich, MD  UNABLE TO FIND [facility] Series 100 BP Monitor    [provider]  UNABLE TO FIND "Accu-Check Guide Me" Blood Glucose Monitoring System    [provider]      Allergies    Bee venom, Penicillins, Ace inhibitors, Chlorthalidone, Lactose intolerance (gi), Metformin , and Sulfamethoxazole    Review of Systems   Review of Systems  Cardiovascular:  Positive for chest pain and leg swelling.  All other systems reviewed and are negative.   Physical Exam Updated Vital Signs BP (!) 140/61   Pulse 92   Temp 98.5 F (36.9 C)   Resp 18   Ht 5\' 2"  (1.575 m)   Wt 66.5 kg   SpO2 100%   BMI 26.81 kg/m  Physical Exam Vitals and nursing note reviewed.  Constitutional:      General: She is not in acute distress.    Appearance: Normal appearance. She is not ill-appearing or diaphoretic.  HENT:     Head: Normocephalic and atraumatic.     Mouth/Throat:     Mouth: Mucous membranes are moist.     Pharynx: Oropharynx is clear. No oropharyngeal exudate or posterior oropharyngeal erythema.  Eyes:     General: No scleral icterus.       Right eye: No discharge.        Left eye: No discharge.     Extraocular Movements: Extraocular movements intact.     Conjunctiva/sclera: Conjunctivae normal.     Comments: Conjunctiva appear pale  Cardiovascular:     Rate and Rhythm: Normal rate and regular rhythm.     Pulses: Normal pulses.     Heart sounds: Normal heart sounds. No murmur heard.    No friction rub. No gallop.  Pulmonary:     Effort: Pulmonary effort is normal. No respiratory distress.     Breath sounds: Normal breath sounds. No stridor. No wheezing, rhonchi or rales.  Chest:     Chest wall: Tenderness present.  Abdominal:     General: Abdomen is flat. There is no distension.     Palpations: Abdomen is soft.     Tenderness: There is no abdominal tenderness. There is no right CVA  tenderness, left CVA tenderness or guarding.  Musculoskeletal:     Cervical back: Normal range of motion. No rigidity.  Skin:    General: Skin is warm and dry.     Coloration: Skin is pale.     Findings: No bruising, erythema or lesion.  Neurological:     General: No focal deficit present.     Mental Status: She is alert and oriented to person, place, and time. Mental status is at baseline.     Sensory: No sensory deficit.     Motor: No weakness.  Psychiatric:        Mood and  Affect: Mood normal.     ED Results / Procedures / Treatments   Labs (all labs ordered are listed, but only abnormal results are displayed) Labs Reviewed  TYPE AND SCREEN  TROPONIN I (HIGH SENSITIVITY)    EKG None  Radiology No results found.  Procedures Procedures    Medications Ordered in ED Medications - No data to display  ED Course/ Medical Decision Making/ A&P   {   Click here for ABCD2, HEART and other calculatorsREFRESH Note before signing :1}                              Medical Decision Making  This patient is a 78 year old female who presents to the ED for concern of low hemoglobin noted to be 5.9 at PCP today, shortness of breath x 2 weeks, acutely worse since last night.  On Plavix , no missed doses.  On physical exam, patient is in no acute distress, afebrile, alert and orient x 4, speaking in full sentences, nontachypneic, nontachycardic.  Chest tenderness noted to palpation across chest wall/sternum.  No bruising or ecchymosis noted across skin.  Conjunctiva appear pale bilaterally and mucous membranes in mouth also appear pale.  LCTAB, no murmurs.  Bilateral lower leg edema also noted.  Exam was otherwise unremarkable  Differential diagnoses prior to evaluation: The emergent differential diagnosis includes, but is not limited to,  *** . This is not an exhaustive differential.   Past Medical History / Co-morbidities / Social History: Type 2 diabetes, HTN, HLD, breast cancer,  CVA, insomnia, CHF, GERD, symptomatic anemia, left heart cath and coronary angiography on 10/20/2023  Additional history: Chart reviewed. Pertinent results include:   Patient seen today by PCP, concerned about possible acute on chronic CHF and worsening anemia despite anticoagulation and Plavix .  Noted to have a hemoglobin of 5.9 today.  Left heart cath done on 10/20/2023 noted to have three-vessel obstructive CAD suspect culprit vessel is the Lcx.  Poor candidate for CABG and poor targets for PCI.  Noted to have previously been in cardiac arrest with hypoxic respiratory failure secondary to influenza A pneumonia and COPD exacerbation during previous admission on 10/13/2023.  Hemoccult positive at that time.  Colonoscopy revealed polyps and nonbleeding internal hemorrhoids, received IV iron  and 2 units of blood.  Last saw cardiology on 12/07/2023, noting to have been in cardiac arrest with the hypoxic respiratory failure  Echocardiogram on 09/2023 noted to have an LVEF of 60 to 65%.  Lab Tests/Imaging studies: I personally interpreted labs/imaging and the pertinent results include:  ***.   ***I agree with the radiologist interpretation.  Cardiac monitoring: EKG obtained and interpreted by myself and attending physician which shows: ***   Medications: I ordered medication including ***.  I have reviewed the patients home medicines and have made adjustments as needed.  Critical Interventions:  Social Determinants of Health:  Disposition: After consideration of the diagnostic results and the patients response to treatment, I feel that the patient would benefit from ***.   ***emergency department workup does not suggest an emergent condition requiring admission or immediate intervention beyond what has been performed at this time. The plan is: ***. The patient is safe for discharge and has been instructed to return immediately for worsening symptoms, change in symptoms or any other  concerns.   {Document critical care time when appropriate:1} {Document review of labs and clinical decision tools ie heart score, Chads2Vasc2 etc:1}  {Document your independent  review of radiology images, and any outside records:1} {Document your discussion with family members, caretakers, and with consultants:1} {Document social determinants of health affecting pt's care:1} {Document your decision making why or why not admission, treatments were needed:1} Final Clinical Impression(s) / ED Diagnoses Final diagnoses:  None    Rx / DC Orders ED Discharge Orders     None

## 2023-12-23 NOTE — Progress Notes (Signed)
 Established Patient Office Visit   Subjective  Patient ID: Anita Mccormick, female    DOB: [birth year]  Age: 78 y.o. MRN: [id number]  Chief Complaint  Patient presents with   Medical Management of Chronic Issues    Follow-up, cough, SOB, dizziness, chest feels heavy    Pt is a 78 yo female seen for follow-up and to have CGM exchanged.  Patient with SOB and a sensation of chest pressure x 1 week.  Symptoms worsened overnight.  Described feeling like 'something was sitting on my chest.' Symptoms have worsened and occur with minimal exertion, such as walking to the bathroom at night or from the car to the office. She has used her inhaler, which provided some relief, and took nitroglycerin  despite not having chest pain, which helped her symptoms.  No wheezing but there is a cough without productive phlegm. She also experienced nausea and gagging without vomiting. Occasional dizziness and swelling in her legs.  No recent changes in salt intake.  Her blood sugar levels have been high, with readings in the 300s, and a recent reading of 377. She is currently taking glipizide  but has held Trulicity  after a recent hospital stay.   She has difficulty using her glucose monitoring equipment.  Thought cgm was providing inaccurate readings since the numbers were so high.  Taking iron  twice daily and reports regular bowel movements without constipation. She mentions a history of her heart skipping beats, which she has felt more recently, and describes an episode of her heart racing last night.    Patient Active Problem List   Diagnosis Date Noted   Heme positive stool 11/08/2023   Non-ST elevation (NSTEMI) myocardial infarction (HCC) 10/21/2023   Malnutrition of moderate degree 10/16/2023   Acute respiratory failure with hypoxia (HCC) 10/13/2023   Influenza A 10/13/2023   Cardiac arrest (HCC) 10/13/2023   Pneumothorax on left 10/13/2023   Central retinal artery occlusion of right eye 10/25/2021    Atherosclerosis of aorta (HCC) 05/18/2020   Cirrhosis of liver without ascites (HCC) 05/18/2020   Internal hemorrhoids 06/22/2019   Anemia    Chronic kidney disease (CKD), stage II (mild) 09/19/2017   Leukopenia 09/19/2017   Chronic diastolic CHF (congestive heart failure) (HCC) 09/19/2017   Statins contraindicated 06/12/2017   Hepatotoxicity due to statin drug 06/12/2017   History of adenomatous polyp of colon 03/18/2017   Viral URI with cough 01/17/2017   Thrombocytopenia (HCC) 12/19/2015   Insomnia 08/21/2015   Nicotine  use disorder 05/21/2015   Eczema 05/21/2015   Fatty liver disease, nonalcoholic 07/14/2014   Hyperlipidemia 11/03/2007   History of CVA (cerebrovascular accident) 10/20/2007   Type II diabetes mellitus with renal manifestations (HCC) 12/10/2006   Essential hypertension 12/10/2006   History of breast cancer 12/10/2006   Past Medical History:  Diagnosis Date   Arthritis    Cancer [facility])    breast left   Cerebrovascular accident [facility])    October 29 2021- Left eye- blind   COLONIC POLYPS, HX OF 12/10/2006   Qualifier: Diagnosis of  By: Penney Bowling MD, Bruce     Diabetes mellitus type II, controlled (HCC) 12/10/2006   Poor control on Januvia  100mg  and glipizide  10mg  XL Lab Results  Component Value Date   HGBA1C 8.3* 11/02/2014        Eczema    Fatty liver disease, nonalcoholic 07/14/2014   Per Dr. Penney Bowling Lab Results  Component Value Date   ALT 31 11/02/2014   AST 57* 11/02/2014   ALKPHOS 104  11/02/2014   BILITOT 1.1 11/02/2014       GERD (gastroesophageal reflux disease)    History of blood transfusion    History of kidney stones 2023   11/28/21 small stone currently, taking flomax    Hypertension    Iron  deficiency anemia, unspecified 05/08/2023   Sepsis (HCC) 09/19/2017   Sepsis due to Escherichia coli (E. coli) (HCC)    Symptomatic anemia 06/06/2021   UTI (urinary tract infection) 09/19/2017   Vaginal discharge 04/01/2022   Past Surgical History:  Procedure  Laterality Date   ARTERY BIOPSY Left 11/29/2021   Procedure: LEFT BIOPSY TEMPORAL ARTERY;  Surgeon: Dannis Dy, MD;  Location[facility] OR;  Service: Vascular;  Laterality: Left;   BIOPSY OF SKIN SUBCUTANEOUS TISSUE AND/OR MUCOUS MEMBRANE  11/10/2023   Procedure: BIOPSY;  Surgeon: Daina Drum, MD;  [facility] ENDOSCOPY;  Service: Gastroenterology;;   CATARACT EXTRACTION Bilateral    COLONOSCOPY N/A 11/10/2023   Procedure: COLONOSCOPY;  Surgeon: Daina Drum, MD;  Location: [facility] ENDOSCOPY;  Service: Gastroenterology;  Laterality: N/A;   ESOPHAGOGASTRODUODENOSCOPY N/A 11/10/2023   Procedure: EGD (ESOPHAGOGASTRODUODENOSCOPY);  Surgeon: Daina Drum, MD;  Location[facility] ENDOSCOPY;  Service: Gastroenterology;  Laterality: N/A;   HOT HEMOSTASIS N/A 11/10/2023   Procedure: EGD, WITH ARGON PLASMA COAGULATION;  Surgeon: Daina Drum, MD;  Location: [facility] ENDOSCOPY;  Service: Gastroenterology;  Laterality: N/A;   LEFT HEART CATH AND CORONARY ANGIOGRAPHY N/A 10/20/2023   Procedure: LEFT HEART CATH AND CORONARY ANGIOGRAPHY;  Surgeon: Swaziland, Peter M, MD;  Location: [facility] INVASIVE CV LAB;  Service: Cardiovascular;  Laterality: N/A;   MASTECTOMY Left    POLYPECTOMY  11/10/2023   Procedure: POLYPECTOMY, INTESTINE;  Surgeon: Daina Drum, MD;  Location: [facility] ENDOSCOPY;  Service: Gastroenterology;;   TUBAL LIGATION     Social History   Tobacco Use   Smoking status: Some Days    Current packs/day: 0.20    Average packs/day: 0.2 packs/day for 10.0 years (2.0 ttl pk-yrs)    Types: Cigarettes   Smokeless tobacco: Never  Vaping Use   Vaping status: Never Used  Substance Use Topics   Alcohol use: Not Currently   Drug use: No   Family History  Problem Relation Age of Onset   Stroke Mother    Cancer Father        prostate   Diabetes Sister    Pancreatic cancer Sister    Allergies  Allergen Reactions   Bee Venom Swelling   Penicillins Hives   Ace Inhibitors Cough    ????   Chlorthalidone      REACTION: unspecified   Lactose Intolerance (Gi) Diarrhea   Metformin      REACTION: gi side effects   Sulfamethoxazole     REACTION: questionable    ROS Negative unless stated above    Objective:      BP 132/70 (BP Location: Left Arm, Patient Position: Sitting, Cuff Size: Normal)   Pulse (!) 101   Temp 98.9 F (37.2 C) (Oral)   Ht 5\' 2"  (1.575 m)   Wt 146 lb 9.6 oz (66.5 kg)   SpO2 98%   BMI 26.81 kg/m  BP Readings from Last 3 Encounters:  12/23/23 132/70  12/07/23 136/60  11/18/23 130/74   Wt Readings from Last 3 Encounters:  12/23/23 146 lb 9.6 oz (66.5 kg)  12/07/23 144 lb (65.3 kg)  11/18/23 135 lb 9.6 oz (61.5 kg)      Physical Exam Constitutional:  General: She is not in acute distress.    Appearance: Normal appearance.  HENT:     Head: Normocephalic and atraumatic.     Nose: Nose normal.     Mouth/Throat:     Mouth: Mucous membranes are moist.  Cardiovascular:     Rate and Rhythm: Regular rhythm. Tachycardia present.     Heart sounds: Normal heart sounds. No murmur heard.    No gallop.  Pulmonary:     Effort: Pulmonary effort is normal. No respiratory distress.     Breath sounds: Normal breath sounds. No wheezing, rhonchi or rales.  Skin:    General: Skin is warm and dry.  Neurological:     Mental Status: She is alert and oriented to person, place, and time. Mental status is at baseline.     Comments: Using rollator to aid with ambulation.     Results for orders placed or performed in visit on 12/23/23  CBC with Differential/Platelet  Result Value Ref Range   WBC 3.8 (L) 4.0 - 10.5 K/uL   RBC 1.97 (L) 3.87 - 5.11 Mil/uL   Hemoglobin 5.9 Repeated and verified X2. (LL) 12.0 - 15.0 g/dL   HCT 16.1 Repeated and verified X2. (LL) 36.0 - 46.0 %   MCV 97.0 78.0 - 100.0 fl   MCHC 30.8 30.0 - 36.0 g/dL   RDW 09.6 (H) 04.5 - 40.9 %   Platelets 114.0 (L) 150.0 - 400.0 K/uL   Neutrophils Relative % 70.3 43.0 - 77.0 %   Lymphocytes Relative 13.2  12.0 - 46.0 %   Monocytes Relative 11.7 3.0 - 12.0 %   Eosinophils Relative 3.9 0.0 - 5.0 %   Basophils Relative 0.9 0.0 - 3.0 %   Neutro Abs 2.7 1.4 - 7.7 K/uL   Lymphs Abs 0.5 (L) 0.7 - 4.0 K/uL   Monocytes Absolute 0.5 0.1 - 1.0 K/uL   Eosinophils Absolute 0.1 0.0 - 0.7 K/uL   Basophils Absolute 0.0 0.0 - 0.1 K/uL  Brain Natriuretic Peptide  Result Value Ref Range   Pro B Natriuretic peptide (BNP) 880.0 (H) 0.0 - 100.0 pg/mL  CMP  Result Value Ref Range   Sodium 136 135 - 145 mEq/L   Potassium 4.0 3.5 - 5.1 mEq/L   Chloride 105 96 - 112 mEq/L   CO2 25 19 - 32 mEq/L   Glucose, Bld 366 (H) 70 - 99 mg/dL   BUN 15 6 - 23 mg/dL   Creatinine, Ser 8.11 0.40 - 1.20 mg/dL   Total Bilirubin 0.7 0.2 - 1.2 mg/dL   Alkaline Phosphatase 108 39 - 117 U/L   AST 17 0 - 37 U/L   ALT 15 0 - 35 U/L   Total Protein 6.0 6.0 - 8.3 g/dL   Albumin  3.6 3.5 - 5.2 g/dL   GFR 91.47 (L) >82.95 mL/min   Calcium  9.0 8.4 - 10.5 mg/dL  TSH  Result Value Ref Range   TSH 1.17 0.35 - 5.50 uIU/mL  T4, free  Result Value Ref Range   Free T4 0.96 0.60 - 1.60 ng/dL      Assessment & Plan:   SOB (shortness of breath) -     TSH -     T4, free -     EKG 12-Lead -     Iron , TIBC and Ferritin Panel  Chest pressure -     CBC with Differential/Platelet -     D-dimer, quantitative -     EKG 12-Lead  Chronic diastolic CHF (  congestive heart failure) (HCC) -     Brain natriuretic peptide -     Comprehensive metabolic panel with GFR  Type 2 diabetes mellitus with other diabetic kidney complication, without long-term current use of insulin  (HCC) -     Comprehensive metabolic panel with GFR   Patient with increased shortness of breath, chest pressure in the last week.  Mentions concerning episode of chest pressure overnight relieved by NTG SL.  EKG in clinic NSR, no ST elevation or T wave inversion.  Compared to EKGs in chart.  Discussed obtaining labs as concern for possibility of acute on chronic CHF  exacerbation, worsening anemia, MI, PE despite anticoagulation with Plavix .  Patient advised to restart Trulicity  1.5 mg weekly as blood sugar reading in clinic 377.  Patient has medication at home.  Continue glipizide  XL 10 mg twice daily.  Update: Notified by lab of critical lab values hemoglobin 5.9, hematocrit 19.  Pt contacted.  See phone note.  Advised to proceed to nearest ED immediately.   Return in about 4 weeks (around 01/20/2024).   Viola Greulich, MD

## 2023-12-23 NOTE — Telephone Encounter (Signed)
 Patient contacted regarding critical labs, Hgb 5.9, HCT 19.  Patient advised to proceed to nearest ED.  Patient initially hesitant as taking iron  twice a day, but expressed understanding.

## 2023-12-23 NOTE — Patient Instructions (Signed)
 Restart Trulicity .  Monitor your blood sugars at home using the continuous glucometer.  If the continuous glucometer is not working or gives you a really high or low reading check it using the fingerstick traditional glucose meter.  If you continue to have chest pressure or pain, shortness of breath that is unrelieved by nitroglycerin  or is occurring several times per day proceed to nearest emergency department.

## 2023-12-23 NOTE — ED Notes (Signed)
 Nt called ccmd @11 :56pm

## 2023-12-23 NOTE — ED Notes (Signed)
 Patient transported to X-ray

## 2023-12-23 NOTE — ED Triage Notes (Signed)
 She was  sent here from her doctors office today because her hgb was 5.9 no previous history

## 2023-12-23 NOTE — Progress Notes (Signed)

## 2023-12-23 NOTE — Telephone Encounter (Signed)
 See telephone note.  Patient contacted. To proceed to nearest ED.

## 2023-12-24 DIAGNOSIS — D649 Anemia, unspecified: Secondary | ICD-10-CM | POA: Diagnosis not present

## 2023-12-24 DIAGNOSIS — K922 Gastrointestinal hemorrhage, unspecified: Secondary | ICD-10-CM | POA: Diagnosis not present

## 2023-12-24 DIAGNOSIS — D696 Thrombocytopenia, unspecified: Secondary | ICD-10-CM | POA: Diagnosis not present

## 2023-12-24 DIAGNOSIS — R1310 Dysphagia, unspecified: Secondary | ICD-10-CM

## 2023-12-24 DIAGNOSIS — I5033 Acute on chronic diastolic (congestive) heart failure: Secondary | ICD-10-CM

## 2023-12-24 DIAGNOSIS — R195 Other fecal abnormalities: Secondary | ICD-10-CM | POA: Diagnosis not present

## 2023-12-24 DIAGNOSIS — D500 Iron deficiency anemia secondary to blood loss (chronic): Secondary | ICD-10-CM | POA: Diagnosis not present

## 2023-12-24 LAB — COMPREHENSIVE METABOLIC PANEL WITH GFR
ALT: 18 U/L (ref 0–44)
AST: 24 U/L (ref 15–41)
Albumin: 3.1 g/dL — ABNORMAL LOW (ref 3.5–5.0)
Alkaline Phosphatase: 82 U/L (ref 38–126)
Anion gap: 7 (ref 5–15)
BUN: 10 mg/dL (ref 8–23)
CO2: 25 mmol/L (ref 22–32)
Calcium: 9.1 mg/dL (ref 8.9–10.3)
Chloride: 108 mmol/L (ref 98–111)
Creatinine, Ser: 1.06 mg/dL — ABNORMAL HIGH (ref 0.44–1.00)
GFR, Estimated: 54 mL/min — ABNORMAL LOW (ref >60)
Glucose, Bld: 138 mg/dL — ABNORMAL HIGH (ref 70–99)
Potassium: 3.9 mmol/L (ref 3.5–5.1)
Sodium: 140 mmol/L (ref 135–145)
Total Bilirubin: 4.6 mg/dL — ABNORMAL HIGH (ref 0.0–1.2)
Total Protein: 5.9 g/dL — ABNORMAL LOW (ref 6.5–8.1)

## 2023-12-24 LAB — URINALYSIS, ROUTINE W REFLEX MICROSCOPIC
Bacteria, UA: NONE SEEN
Bilirubin Urine: NEGATIVE
Glucose, UA: >=500 mg/dL — AB
Hgb urine dipstick: NEGATIVE
Ketones, ur: NEGATIVE mg/dL
Leukocytes,Ua: NEGATIVE
Nitrite: NEGATIVE
Protein, ur: NEGATIVE mg/dL
Specific Gravity, Urine: 1.027 (ref 1.005–1.030)
pH: 6 (ref 5.0–8.0)

## 2023-12-24 LAB — CBC
HCT: 29.9 % — ABNORMAL LOW (ref 36.0–46.0)
Hemoglobin: 9 g/dL — ABNORMAL LOW (ref 12.0–15.0)
MCH: 27.5 pg (ref 26.0–34.0)
MCHC: 30.1 g/dL (ref 30.0–36.0)
MCV: 91.4 fL (ref 80.0–100.0)
Platelets: 128 10*3/uL — ABNORMAL LOW (ref 150–400)
RBC: 3.27 MIL/uL — ABNORMAL LOW (ref 3.87–5.11)
RDW: 24.2 % — ABNORMAL HIGH (ref 11.5–15.5)
WBC: 6.6 10*3/uL (ref 4.0–10.5)
nRBC: 0.3 % — ABNORMAL HIGH (ref 0.0–0.2)

## 2023-12-24 LAB — IRON AND TIBC
Iron: 303 ug/dL — ABNORMAL HIGH (ref 28–170)
Saturation Ratios: 76 % — ABNORMAL HIGH (ref 10.4–31.8)
TIBC: 398 ug/dL (ref 250–450)
UIBC: 95 ug/dL

## 2023-12-24 LAB — IRON,TIBC AND FERRITIN PANEL
%SAT: 4 % — ABNORMAL LOW (ref 16–45)
Ferritin: 19 ng/mL (ref 16–288)
Iron: 15 ug/dL — ABNORMAL LOW (ref 45–160)
TIBC: 360 ug/dL (ref 250–450)

## 2023-12-24 LAB — RETICULOCYTES
Immature Retic Fract: 35.5 % — ABNORMAL HIGH (ref 2.3–15.9)
RBC.: 3.06 MIL/uL — ABNORMAL LOW (ref 3.87–5.11)
Retic Count, Absolute: 184.2 10*3/uL (ref 19.0–186.0)
Retic Ct Pct: 6 % — ABNORMAL HIGH (ref 0.4–3.1)

## 2023-12-24 LAB — HEMOGLOBIN A1C
Hgb A1c MFr Bld: 6.1 % — ABNORMAL HIGH (ref 4.8–5.6)
Mean Plasma Glucose: 128.37 mg/dL

## 2023-12-24 LAB — D-DIMER, QUANTITATIVE: D-Dimer, Quant: 0.34 ug{FEU}/mL (ref <0.50)

## 2023-12-24 LAB — FERRITIN: Ferritin: 22 ng/mL (ref 11–307)

## 2023-12-24 LAB — FOLATE: Folate: 20.1 ng/mL (ref >5.9)

## 2023-12-24 LAB — GLUCOSE, CAPILLARY
Glucose-Capillary: 157 mg/dL — ABNORMAL HIGH (ref 70–99)
Glucose-Capillary: 228 mg/dL — ABNORMAL HIGH (ref 70–99)
Glucose-Capillary: 266 mg/dL — ABNORMAL HIGH (ref 70–99)
Glucose-Capillary: 374 mg/dL — ABNORMAL HIGH (ref 70–99)
Glucose-Capillary: 78 mg/dL (ref 70–99)
Glucose-Capillary: 94 mg/dL (ref 70–99)

## 2023-12-24 LAB — PREPARE RBC (CROSSMATCH)

## 2023-12-24 LAB — VITAMIN B12: Vitamin B-12: 723 pg/mL (ref 180–914)

## 2023-12-24 MED ORDER — IPRATROPIUM-ALBUTEROL 0.5-2.5 (3) MG/3ML IN SOLN: 3.0000 mL | Freq: Four times a day (QID) | RESPIRATORY_TRACT | Status: DC | PRN

## 2023-12-24 MED ORDER — INSULIN ASPART 100 UNIT/ML IJ SOLN
0.0000 [IU] | INTRAMUSCULAR | Status: DC
  Administered 2023-12-24: 15 [IU] via SUBCUTANEOUS
  Administered 2023-12-24: 5 [IU] via SUBCUTANEOUS
  Administered 2023-12-24: 8 [IU] via SUBCUTANEOUS
  Administered 2023-12-24: 3 [IU] via SUBCUTANEOUS
  Administered 2023-12-25: 8 [IU] via SUBCUTANEOUS
  Administered 2023-12-25: 11 [IU] via SUBCUTANEOUS
  Administered 2023-12-25: 2 [IU] via SUBCUTANEOUS
  Administered 2023-12-25: 3 [IU] via SUBCUTANEOUS
  Administered 2023-12-26: 0.3 [IU] via SUBCUTANEOUS
  Administered 2023-12-26: 3 [IU] via SUBCUTANEOUS
  Administered 2023-12-26: 2 [IU] via SUBCUTANEOUS
  Administered 2023-12-26: 5 [IU] via SUBCUTANEOUS

## 2023-12-24 MED ORDER — ATORVASTATIN CALCIUM 80 MG PO TABS
80.0000 mg | ORAL_TABLET | Freq: Every day | ORAL | Status: DC
  Administered 2023-12-24 – 2023-12-27 (×4): 80 mg via ORAL
  Filled 2023-12-24 (×4): qty 1

## 2023-12-24 MED ORDER — ASPIRIN 81 MG PO TBEC
81.0000 mg | DELAYED_RELEASE_TABLET | Freq: Every day | ORAL | Status: DC
  Administered 2023-12-24: 81 mg via ORAL
  Filled 2023-12-24: qty 1

## 2023-12-24 MED ORDER — ONDANSETRON HCL 4 MG PO TABS: 4.0000 mg | ORAL_TABLET | Freq: Four times a day (QID) | ORAL | Status: DC | PRN

## 2023-12-24 MED ORDER — SENNOSIDES-DOCUSATE SODIUM 8.6-50 MG PO TABS: 1.0000 | ORAL_TABLET | Freq: Every evening | ORAL | Status: DC | PRN

## 2023-12-24 MED ORDER — NITROGLYCERIN 0.4 MG SL SUBL: 0.4000 mg | SUBLINGUAL_TABLET | SUBLINGUAL | Status: DC | PRN

## 2023-12-24 MED ORDER — FUROSEMIDE 10 MG/ML IJ SOLN
20.0000 mg | Freq: Once | INTRAMUSCULAR | Status: AC
  Administered 2023-12-24: 20 mg via INTRAVENOUS
  Filled 2023-12-24: qty 2

## 2023-12-24 MED ORDER — PANTOPRAZOLE SODIUM 40 MG IV SOLR
40.0000 mg | Freq: Two times a day (BID) | INTRAVENOUS | Status: DC
  Administered 2023-12-24 – 2023-12-27 (×8): 40 mg via INTRAVENOUS
  Filled 2023-12-24 (×8): qty 10

## 2023-12-24 MED ORDER — GLIPIZIDE ER 10 MG PO TB24
10.0000 mg | ORAL_TABLET | Freq: Two times a day (BID) | ORAL | Status: DC
  Administered 2023-12-24 – 2023-12-27 (×7): 10 mg via ORAL
  Filled 2023-12-24 (×8): qty 1

## 2023-12-24 MED ORDER — SODIUM CHLORIDE 0.9% IV SOLUTION: Freq: Once | INTRAVENOUS | Status: AC

## 2023-12-24 MED ORDER — METOPROLOL SUCCINATE ER 25 MG PO TB24
25.0000 mg | ORAL_TABLET | Freq: Every day | ORAL | Status: DC
  Administered 2023-12-24 – 2023-12-27 (×4): 25 mg via ORAL
  Filled 2023-12-24 (×4): qty 1

## 2023-12-24 MED ORDER — CLOPIDOGREL BISULFATE 75 MG PO TABS
75.0000 mg | ORAL_TABLET | Freq: Every day | ORAL | Status: DC
  Administered 2023-12-24: 75 mg via ORAL
  Filled 2023-12-24: qty 1

## 2023-12-24 MED ORDER — ACETAMINOPHEN 650 MG RE SUPP: 650.0000 mg | Freq: Four times a day (QID) | RECTAL | Status: DC | PRN

## 2023-12-24 MED ORDER — ACETAMINOPHEN 325 MG PO TABS: 650.0000 mg | ORAL_TABLET | Freq: Four times a day (QID) | ORAL | Status: DC | PRN

## 2023-12-24 MED ORDER — ONDANSETRON HCL 4 MG/2ML IJ SOLN: 4.0000 mg | Freq: Four times a day (QID) | INTRAMUSCULAR | Status: DC | PRN

## 2023-12-24 MED ORDER — SODIUM CHLORIDE 0.9 % IV SOLN: INTRAVENOUS | Status: DC

## 2023-12-24 MED ORDER — ADULT MULTIVITAMIN W/MINERALS CH
1.0000 | ORAL_TABLET | Freq: Every day | ORAL | Status: DC
  Administered 2023-12-24 – 2023-12-27 (×4): 1 via ORAL
  Filled 2023-12-24 (×4): qty 1

## 2023-12-24 NOTE — Inpatient Diabetes Management (Incomplete)
 Inpatient Diabetes Program Recommendations  AACE/ADA: New Consensus Statement on Inpatient Glycemic Control (2015)  Target Ranges:  Prepandial:   less than 140 mg/dL      Peak postprandial:   less than 180 mg/dL (1-2 hours)      Critically ill patients:  140 - 180 mg/dL   Lab Results  Component Value Date   GLUCAP 228 (H) 12/24/2023   HGBA1C 5.0 10/13/2023    Review of Glycemic Control  Latest Reference Range & Units 12/24/23 04:56 12/24/23 08:09  Glucose-Capillary 70 - 99 mg/dL 161 (H) 096 (H)   Diabetes history: DM 2 Outpatient Diabetes medications:  Trulicity  1.5 mg weekly  Current orders for Inpatient glycemic control: ***  Inpatient Diabetes Program Recommendations:

## 2023-12-24 NOTE — ED Notes (Signed)
 Attempted to call 2W charge, no answer.

## 2023-12-24 NOTE — Plan of Care (Signed)

## 2023-12-24 NOTE — Consult Note (Addendum)
 Referring Provider: Dr. Pandora Bogaert Primary Care Physician:  Viola Greulich, MD Primary Gastroenterologist:  Dr. Lorella Roles  Reason for Consultation: Anemia  HPI: Anita Mccormick is a 77 y.o. female with a past medical history of arthritis, breast cancer, hypertension, CAD s/p PEA cardiac arrest and NSTEMI 09/2023, CHF, diabetes mellitus type 2, kidney stones, CKD stage II, MASLD/cirrhosis, iron  deficiency anemia, cytopenia, GERD gastric and duodenal AVM, diverticulosis and colon polyps.  She was previously admitted to the hospital 3/18 - 11/11/2023 acute respiratory failure secondary to influenza A treated with Tamiflu . Her hospital course was complicated by syncope, hypoxia followed by PEA cardiac arrest requiring CPR x 3 on 10/14/2023 and was subsequently diagnosed with a NSTEMI. She required intubation, pressors and admission to the ICU. She developed a left pneumothorax and required a chest tube. Cardiac cath on 10/20/2023 which did not show any disease amenable to PCI and medical therapy was recommended with aspirin . She was seen by Dr. Dominic Friendly with our inpatient GI service during this hospital admission after her Hg level continued to downward, hemoglobin 7.1 on 4/11.  She was transfused 2 units of PRBCs and received IV iron . She underwent an EGD and colonoscopy 415/2025, the EGD identified 4 nonbleeding AVMs in the stomach and a single nonbleeding AVM in the duodenum which were treated with APC.  The EGD also identified esophageal candidiasis treated with Fluconazole . The colonoscopy identified 2 tubular adenomatous polyps removed from the colon. She was discharged home on Pantoprazole  40 mg twice daily for 8 weeks then daily.  Hemoglobin was 9.3 at discharge on 4/16.  She has been staying with her daughter since discharge.  She developed shortness of breath with chest pressure for the past 2 weeks which progressively worsened.  She saw her PCP who drew labs and she was informed her hemoglobin  level was critically low and she was sent directly to the ED for further evaluation 12/23/2023.  She is on Plavix  as a 81 mg daily.  Labs in the ED showed a Hg level of 5.9 (Hg 8.0 on 12/07/2023).  Hematocrit 19.1.  Platelets 114.  D-dimer 0.34.  Iron  15.  Iron  saturation 4%.  Ferritin 19.  BUN 15.  Creatinine 0.98.  Normal LFTs.  Glucose 366.  INR 1.2.  Troponin 81.  SARS coronavirus 2 and influenza A and B negative.  FOBT positive.  She was transfused 2 units of PRBCs, second transfusion completed around 10 AM today.  Transfusion H&H not yet collected.  A GI consult was requested for further evaluation regarding recurrent anemia with positive FOBT.  Her last dose of Plavix  and aspirin  were taken around 10 AM yesterday.  She endorses passing normal solid black stools chronically since starting oral iron  several years ago.  She infrequently sees a small amount of bright red blood on the toilet tissue after wiping.  She has infrequent heartburn. On Pantoprazole  40 mg twice daily since she was discharged from the hospital.  She endorses having intermittent dysphagia for several years, describes food or liquids that sometimes gets stuck in the throat/upper esophagus and passes if she waits a few seconds or drinks a few sips of water.  No upper or lower abdominal pain.  No chest pain or shortness of breath at this time.  No family at the bedside.  GI PROCEDURES:  EGD 11/10/2023: - White nummular lesions in esophageal mucosa. Biopsied.  - Four non-bleeding angioectasias in the stomach. Treated with argon plasma coagulation (APC).  - Gastritis. Biopsied.  -  A single non-bleeding angioectasia in the duodenum. Treated with argon plasma coagulation (APC).  - Duodenal lipoma. - Non-bleeding duodenal diverticulum.  Colonoscopy 11/10/2023: - The examined portion of the ileum was normal.  - Two 3 to 12 mm polyps in the transverse colon, removed with a cold snare. Resected and retrieved. - Nonbleeding internal  hemorrhoids - Recall colonoscopy 3 years  A. STOMACH, BIOPSY:  Reactive gastropathy showing foveolar hyperplasia and focal activity  Helicobacter stain negative (IHC, adequate control)  Negative for intestinal metaplasia, dysplasia and carcinoma   B. ESOPHAGUS, BIOPSY:  Reactive squamous mucosa with Candida and bacterial colonies  Negative for acute inflammation  Negative for glandular epithelium, eosinophils, dysplasia and carcinoma   C. TRANSVERSE COLON, POLYPECTOMY:  Tubular adenoma, 5 fragments  Negative for high-grade dysplasia and carcinoma   Colonoscopy 08/22/2019:  3 polyps removed largest 10 mm and noted to have large internal hemorrhoids. Path from the polyp showed the rectal polyp. Tubulovillous adenoma and descending colon polyp was a tubular adenoma.   Past Medical History:  Diagnosis Date   Arthritis    Cancer (HCC)    breast left   Cerebrovascular accident [facility])    October 29 2021- Left eye- blind   COLONIC POLYPS, HX OF 12/10/2006   Qualifier: Diagnosis of  By: Penney Bowling MD, Bruce     Diabetes mellitus type II, controlled (HCC) 12/10/2006   Poor control on Januvia  100mg  and glipizide  10mg  XL Lab Results  Component Value Date   HGBA1C 8.3* 11/02/2014        Eczema    Fatty liver disease, nonalcoholic 07/14/2014   Per Dr. Penney Bowling Lab Results  Component Value Date   ALT 31 11/02/2014   AST 57* 11/02/2014   ALKPHOS 104 11/02/2014   BILITOT 1.1 11/02/2014       GERD (gastroesophageal reflux disease)    History of blood transfusion    History of kidney stones 2023   11/28/21 small stone currently, taking flomax    Hypertension    Iron  deficiency anemia, unspecified 05/08/2023   Sepsis (HCC) 09/19/2017   Sepsis due to Escherichia coli (E. coli) (HCC)    Symptomatic anemia 06/06/2021   UTI (urinary tract infection) 09/19/2017   Vaginal discharge 04/01/2022    Past Surgical History:  Procedure Laterality Date   ARTERY BIOPSY Left 11/29/2021   Procedure: LEFT BIOPSY  TEMPORAL ARTERY;  Surgeon: Dannis Dy, MD;  Location: [facility] OR;  Service: Vascular;  Laterality: Left;   BIOPSY OF SKIN SUBCUTANEOUS TISSUE AND/OR MUCOUS MEMBRANE  11/10/2023   Procedure: BIOPSY;  Surgeon: Daina Drum, MD;  Location: [facility] ENDOSCOPY;  Service: Gastroenterology;;   CATARACT EXTRACTION Bilateral    COLONOSCOPY N/A 11/10/2023   Procedure: COLONOSCOPY;  Surgeon: Daina Drum, MD;  Location: [facility] ENDOSCOPY;  Service: Gastroenterology;  Laterality: N/A;   ESOPHAGOGASTRODUODENOSCOPY N/A 11/10/2023   Procedure: EGD (ESOPHAGOGASTRODUODENOSCOPY);  Surgeon: Daina Drum, MD;  Location: [facility] ENDOSCOPY;  Service: Gastroenterology;  Laterality: N/A;   HOT HEMOSTASIS N/A 11/10/2023   Procedure: EGD, WITH ARGON PLASMA COAGULATION;  Surgeon: Daina Drum, MD;  Location: [facility] ENDOSCOPY;  Service: Gastroenterology;  Laterality: N/A;   LEFT HEART CATH AND CORONARY ANGIOGRAPHY N/A 10/20/2023   Procedure: LEFT HEART CATH AND CORONARY ANGIOGRAPHY;  Surgeon: [place], Peter M, MD;  Location: [facility] INVASIVE CV LAB;  Service: Cardiovascular;  Laterality: N/A;   MASTECTOMY Left    POLYPECTOMY  11/10/2023   Procedure: POLYPECTOMY, INTESTINE;  Surgeon: Daina Drum, MD;  Location: [facility] ENDOSCOPY;  Service: Gastroenterology;;   TUBAL LIGATION      Prior to Admission medications   Medication Sig Start Date End Date Taking? Authorizing Provider  acetaminophen  (TYLENOL ) 325 MG tablet Take 2 tablets (650 mg total) by mouth every 6 (six) hours as needed for mild pain (pain score 1-3) (or Fever >/= 101). Patient taking differently: Take 325 mg by mouth every 6 (six) hours as needed for mild pain (pain score 1-3) (or Fever >/= 101). 10/27/23  Yes Krishnan, Gokul, MD  albuterol  (VENTOLIN  HFA) 108 [phone] Base) MCG/ACT inhaler Inhale 2 puffs into the lungs every 6 (six) hours as needed for wheezing or shortness of breath. 11/11/23  Yes Theadore Finger, MD  aspirin  EC 81 MG tablet Take 81 mg by mouth daily. Swallow whole.    Yes [provider]  atorvastatin  (LIPITOR ) 80 MG tablet Take 1 tablet (80 mg total) by mouth daily. 12/09/23  Yes Viola Greulich, MD  Budeson-Glycopyrrol-Formoterol  (BREZTRI AEROSPHERE IN) Inhale 2 puffs into the lungs 2 (two) times daily.   Yes [provider]  clopidogrel  (PLAVIX ) 75 MG tablet Take 1 tablet (75 mg total) by mouth daily. 12/14/23  Yes Viola Greulich, MD  Dulaglutide  (TRULICITY ) 1.5 MG/0.5ML SOAJ Inject 1.5 mg into the skin once a week. 12/23/23  Yes [provider]  ferrous gluconate  (FERGON) 324 MG tablet Take 1 tablet (324 mg total) by mouth daily. Patient taking differently: Take 324 mg by mouth daily with lunch. 04/23/23  Yes Viola Greulich, MD  glipiZIDE  (GLUCOTROL  XL) 10 MG 24 hr tablet TAKE 1 TABLET TWICE DAILY 12/09/23  Yes Viola Greulich, MD  glucose 4 GM chewable tablet Chew 1 tablet by mouth as needed for low blood sugar.   Yes [provider]  metoprolol  succinate (TOPROL -XL) 25 MG 24 hr tablet Take 1 tablet (25 mg total) by mouth daily. 12/14/23  Yes Viola Greulich, MD  Multiple Vitamin (MULTIVITAMIN) tablet Take 1 tablet by mouth daily.   Yes [provider]  nitroGLYCERIN  (NITROSTAT ) 0.4 MG SL tablet Place 1 tablet (0.4 mg total) under the tongue every 5 (five) minutes as needed for chest pain. 11/18/23 11/17/24 Yes Viola Greulich, MD  pantoprazole  (PROTONIX ) 40 MG tablet Take 1 tablet (40 mg total) by mouth daily. Patient taking differently: Take 40 mg by mouth 2 (two) times daily. 12/14/23  Yes Viola Greulich, MD    Current Facility-Administered Medications  Medication Dose Route Frequency Provider Last Rate Last Admin   acetaminophen  (TYLENOL ) tablet 650 mg  650 mg Oral Q6H PRN Amponsah, Prosper M, MD       Or   acetaminophen  (TYLENOL ) suppository 650 mg  650 mg Rectal Q6H PRN Amponsah, Prosper M, MD       aspirin  EC tablet 81 mg  81 mg Oral Daily Amponsah, Prosper M, MD   81 mg at 12/24/23 0940    atorvastatin  (LIPITOR ) tablet 80 mg  80 mg Oral Daily Amponsah, Prosper M, MD   80 mg at 12/24/23 0940   clopidogrel  (PLAVIX ) tablet 75 mg  75 mg Oral Daily Vita Grip, MD   75 mg at 12/24/23 0940   glipiZIDE  (GLUCOTROL  XL) 24 hr tablet 10 mg  10 mg Oral BID WC Amponsah, Prosper M, MD   10 mg at 12/24/23 4401   insulin  aspart (novoLOG ) injection 0-15 Units  0-15 Units Subcutaneous Q4H Vita Grip, MD   5 Units at 12/24/23 0818   ipratropium-albuterol  (DUONEB) 0.5-2.5 (  3) MG/3ML nebulizer solution 3 mL  3 mL Nebulization Q6H PRN Vita Grip, MD       metoprolol  succinate (TOPROL -XL) 24 hr tablet 25 mg  25 mg Oral Daily Amponsah, Prosper M, MD   25 mg at 12/24/23 0940   multivitamin with minerals tablet 1 tablet  1 tablet Oral Daily Vita Grip, MD   1 tablet at 12/24/23 0940   nitroGLYCERIN  (NITROSTAT ) SL tablet 0.4 mg  0.4 mg Sublingual Q5 min PRN Vita Grip, MD       ondansetron  (ZOFRAN ) tablet 4 mg  4 mg Oral Q6H PRN Vita Grip, MD       Or   ondansetron  (ZOFRAN ) injection 4 mg  4 mg Intravenous Q6H PRN Vita Grip, MD       pantoprazole  (PROTONIX ) injection 40 mg  40 mg Intravenous Q12H Vita Grip, MD   40 mg at 12/24/23 0940   senna-docusate (Senokot-S) tablet 1 tablet  1 tablet Oral QHS PRN Vita Grip, MD       sodium chloride  flush (NS) 0.9 % injection 10-40 mL  10-40 mL Intracatheter Q12H Vita Grip, MD   10 mL at 12/24/23 0941   sodium chloride  flush (NS) 0.9 % injection 10-40 mL  10-40 mL Intracatheter PRN Vita Grip, MD        Allergies as of 12/23/2023 - Review Complete 12/23/2023  Allergen Reaction Noted   Bee venom Swelling 11/28/2021   Penicillins Hives 10/08/2006   Ace inhibitors Cough 03/23/2013   Chlorthalidone  10/08/2006   Lactose intolerance (gi) Diarrhea 10/24/2023   Metformin   07/08/2006   Sulfamethoxazole  10/08/2006    Family History  Problem Relation Age of Onset    Stroke Mother    Cancer Father        prostate   Diabetes Sister    Pancreatic cancer Sister     Social History   Socioeconomic History   Marital status: Married    Spouse name: Not on file   Number of children: Not on file   Years of education: Not on file   Highest education level: Not on file  Occupational History   Not on file  Tobacco Use   Smoking status: Some Days    Current packs/day: 0.20    Average packs/day: 0.2 packs/day for 10.0 years (2.0 ttl pk-yrs)    Types: Cigarettes   Smokeless tobacco: Never  Vaping Use   Vaping status: Never Used  Substance and Sexual Activity   Alcohol use: Not Currently   Drug use: No   Sexual activity: Not Currently  Other Topics Concern   Not on file  Social History Narrative   Married (husband [place] Breen). 4 children. 11 grandkids. 1 greatgrandchild.       Still working as homemaker      Hobbies: playing cards, board games, cooking, Print production planner   Social Drivers of Health   Financial Resource Strain: Low Risk  (10/03/2022)   Overall Financial Resource Strain (CARDIA)    Difficulty of Paying Living Expenses: Not hard at all  Food Insecurity: No Food Insecurity (12/24/2023)   Hunger Vital Sign    Worried About Running Out of Food in the Last Year: Never true    Ran Out of Food in the Last Year: Never true  Transportation Needs: No Transportation Needs (12/24/2023)   PRAPARE - Administrator, Civil Service (Medical): No    Lack of Transportation (Non-Medical): No  Physical  Activity: Inactive (10/03/2022)   Exercise Vital Sign    Days of Exercise per Week: 0 days    Minutes of Exercise per Session: 0 min  Stress: No Stress Concern Present (10/03/2022)   [employer] of Occupational Health - Occupational Stress Questionnaire    Feeling of Stress : Not at all  Social Connections: Moderately Isolated (12/24/2023)   Social Connection and Isolation Panel [NHANES]    Frequency of Communication with Friends and Family: Three  times a week    Frequency of Social Gatherings with Friends and Family: Three times a week    Attends Religious Services: Never    Active Member of Clubs or Organizations: No    Attends Banker Meetings: Never    Marital Status: Married  Catering manager Violence: Not At Risk (12/24/2023)   Humiliation, Afraid, Rape, and Kick questionnaire    Fear of Current or Ex-Partner: No    Emotionally Abused: No    Physically Abused: No    Sexually Abused: No   Review of Systems: Gen: Denies fever, sweats or chills. No weight loss.  CV: Denies chest pain, palpitations or edema. Resp: Denies cough, shortness of breath of hemoptysis.  GI: See HPI.   GU : Denies urinary burning, blood in urine, increased urinary frequency or incontinence. MS: Denies joint pain, muscles aches or weakness. Derm: Denies rash, itchiness, skin lesions or unhealing ulcers. Psych: Denies depression, anxiety, memory loss or confusion. Heme: Denies easy bruising, bleeding. Neuro:  Denies headaches, dizziness or paresthesias. Endo:  + DM type II.  Physical Exam: Vital signs in last 24 hours: Temp:  [98.1 F (36.7 C)-98.8 F (37.1 C)] 98.1 F (36.7 C) (05/29 0945) Pulse Rate:  [77-94] 77 (05/29 0945) Resp:  [12-23] 18 (05/29 0945) BP: (119-166)/(40-110) 119/48 (05/29 0945) SpO2:  [99 %-100 %] 100 % (05/29 0945) Weight:  [66.5 kg] 66.5 kg (05/28 1805) Last BM Date : 12/24/23 General: Alert 78 year old female in no acute distress. Head:  Normocephalic and atraumatic. Eyes:  No scleral icterus. Conjunctiva pink. Ears:  Normal auditory acuity. Nose:  No deformity, discharge or lesions. Mouth:  Dentition intact. No ulcers or lesions.  Neck:  Supple. No lymphadenopathy or thyromegaly.  Lungs: Breath sounds clear throughout. No wheezes, rhonchi or crackles.  Heart: Breath sounds clear decreased in the bases. Abdomen: Soft, nondistended.  Nontender.  Positive bowel sounds to all 4 quadrants.  No palpable  mass.  No hepatosplenomegaly. Rectal: Deferred. Musculoskeletal:  Symmetrical without gross deformities.  Pulses:  Normal pulses noted. Extremities:  Without clubbing or edema. Neurologic:  Alert and  oriented x 4. No focal deficits.  Skin:  Intact without significant lesions or rashes. Psych:  Alert and cooperative. Normal mood and affect.  Intake/Output from previous day: 05/28 0701 - 05/29 0700 In: 540 [P.O.:240; Blood:300] Out: -  Intake/Output this shift: Total I/O In: 731.7 [P.O.:240; Blood:491.7] Out: -   Lab Results: Recent Labs    12/23/23 1356 12/23/23 2025  WBC 3.8* 3.9*  HGB 5.9 Repeated and verified X2.* 5.4*  HCT 19.1 Repeated and verified X2.* 18.5*  PLT 114.0* 108*   BMET Recent Labs    12/23/23 1356 12/23/23 1900  NA 136 138  K 4.0 4.2  CL 105 107  CO2 25 20*  GLUCOSE 366* 439*  BUN 15 13  CREATININE 0.98 1.13*  CALCIUM  9.0 8.9   LFT Recent Labs    12/23/23 1356  PROT 6.0  ALBUMIN  3.6  AST 17  ALT 15  ALKPHOS 108  BILITOT 0.7   PT/INR Recent Labs    12/23/23 1900  LABPROT 14.9  INR 1.2   Hepatitis Panel No results for input(s): "HEPBSAG", "HCVAB", "HEPAIGM", "HEPBIGM" in the last 72 hours.    Studies/Results: CT ANGIO GI BLEED Result Date: 12/23/2023 CLINICAL DATA:  Hemoglobin 5.9 with history of diverticular disease, fecal occult positive. EXAM: CTA ABDOMEN AND PELVIS WITHOUT AND WITH CONTRAST TECHNIQUE: Multidetector CT imaging of the abdomen and pelvis was performed using the standard protocol during bolus administration of intravenous contrast. Multiplanar reconstructed images and MIPs were obtained and reviewed to evaluate the vascular anatomy. RADIATION DOSE REDUCTION: This exam was performed according to the departmental dose-optimization program which includes automated exposure control, adjustment of the mA and/or kV according to patient size and/or use of iterative reconstruction technique. CONTRAST:  70mL OMNIPAQUE  IOHEXOL   350 MG/ML SOLN COMPARISON:  CT abdomen pelvis 09/18/2021 FINDINGS: VASCULAR Aorta: Calcified atherosclerosis. No hemodynamically significant stenosis. No aneurysm or dissection. Celiac: Calcified plaque at the origin. No hemodynamically significant stenosis, aneurysm, or dissection. SMA: Calcified plaque at the origin without hemodynamically significant stenosis. No aneurysm or dissection. Renals: Single right and dual left renal arteries. Mild narrowing of the superior proximal left renal artery. No aneurysm or dissection. IMA: Patent. Inflow: Scattered calcified atherosclerotic plaque without hemodynamically significant stenosis. No aneurysm or dissection. Proximal Outflow: Atherosclerotic plaque without hemodynamically significant stenosis, aneurysm, or dissection. Veins: Patent portal veins and IVC.  Periesophageal varices. Review of the MIP images confirms the above findings. NON-VASCULAR Lower chest: Patchy ground-glass opacities in the lower lobes are favored infectious/inflammatory. 8 mm right lower lobe nodule on series 12/image 134. Hepatobiliary: Nodular patent contour. Nondistended thick-walled bladder likely due to hepatic cirrhosis. No biliary dilation or radiopaque stone. Pancreas: Unremarkable. Spleen: Unremarkable. Adrenals/Urinary Tract: Normal adrenal glands. No urinary calculi or hydronephrosis. Unremarkable bladder. Stomach/Bowel: Stomach is within normal limits. Normal caliber large and small bowel. Colonic diverticulosis without diverticulitis. No bowel wall thickening. Normal appendix. Lymphatic: No lymphadenopathy. Reproductive: Uterus and bilateral adnexa are unremarkable. Other: Trace free fluid in the pelvis. No free intraperitoneal air. Ventral hernia containing fat in the left paramedian region inferior to the umbilicus. This is similar to prior. Musculoskeletal: No acute fracture.  Body wall anasarca. IMPRESSION: 1. No evidence of GI bleed. 2. Colonic diverticulosis without  diverticulitis. 3. Hepatic cirrhosis with sequela of portal hypertension including periesophageal varices. 4. Patchy ground-glass opacities in the lower lobes are favored infectious/inflammatory. 5. 8 mm right lower lobe nodule. Non-contrast chest CT at 6-12 months is recommended. If the nodule is stable at time of repeat CT, then future CT at 18-24 months (from today's scan) is considered optional for low-risk patients, but is recommended for high-risk patients. This recommendation follows the consensus statement: Guidelines for Management of Incidental Pulmonary Nodules Detected on CT Images: From the Fleischner Society 2017; Radiology 2017; 284:228-243. 6. Aortic Atherosclerosis (ICD10-I70.0). 7. Ventral hernia containing fat in the left paramedian region inferior to the umbilicus. This is similar to prior. Electronically Signed   By: Rozell Cornet M.D.   On: 12/23/2023 21:55   DG Chest 2 View Result Date: 12/23/2023 CLINICAL DATA:  Shortness of breath. EXAM: CHEST - 2 VIEW COMPARISON:  Chest radiograph dated 10/23/2023 FINDINGS: Mild central vascular congestion. Indeterminate opacity over the right lung base may be superimposed on the patient. No pleural effusion pneumothorax. Stable cardiac silhouette no acute osseous pathology. IMPRESSION: 1. Mild central vascular congestion. 2. Indeterminate opacity over the right lung base may be  superimposed on the patient. Recommend repeat radiograph with better positioning of the patient. Electronically Signed   By: Angus Bark M.D.   On: 12/23/2023 19:51    IMPRESSION/PLAN:  78 year old female with a history of iron  deficiency anemia admitted with acute on chronic IDA with associated shortness of breath. Stools are chronically black on oral iron . Admission hemoglobin 5.9. Transfused 2 units of PRBCs. Posttransfusion H&H pending.  CTA negative for active GI bleed.  Remains on Plavix  and ASA. EGD/colonoscopy done during her prior admission 11/10/2023  identified 4 nonbleeding AVMs in the stomach and a single nonbleeding AVM in the duodenum which were treated with APC. Colonoscopy identified 2 tubular adenomatous polyps removed from the colon.  Hemodynamically stable. - Transfuse for hemoglobin level less than 8 - CBC in a.m. - Continue Pantoprazole  40 mg IV twice daily - Consider small bowel enteroscopy during this hospital admission, await further recommendations per Dr. Dominic Friendly  Mild chronic dysphagia.  EGD 11/10/2023 showed evidence of candidiasis esophagitis.  No oral thrush on exam. - Continue PPI twice daily - Consider Diflucan  to empirically treat candidiasis if esophagitis - Eventual barium swallow study likely can be done as an outpatient  CAD s/p PEA cardiac arrest and NSTEMI 09/2023. On Plavix  and ASA.  Acute on chronic diastolic CHF.  BNP 912.  Chest x-ray showed mild vascular congestion.  Received IV Lasix .  ECHO 09/2023 showed LVEF 60 to 65%.  MASLD/cirrhosis.  CTA identified hepatic cirrhosis with portal hypertension and periesophageal varices  Thrombocytopenia, no splenomegaly or CTA  CTA identified a 8 mm right lower lobe nodule - Follow-up with PCP as an outpatient  DM type II  Personal history of breast cancer   Tory Freiberg  12/24/2023, 1:52 PM   I have taken an interval history, thoroughly reviewed the chart and examined the patient. I agree with the Advanced Practitioner's note, impression and recommendations, and have recorded additional findings, impressions and recommendations below. I performed a substantive portion of this encounter (>50% time spent), including a complete performance of the medical decision making.  My additional thoughts are as follows:  Acute on chronic anemia believed to be from blood loss in a patient on DAPT with recent endoscopic treatment of gastric and duodenal AVMs. Concern for recurrent or persistent AVMs perhaps in the more distal small bowel. Transfused and  hemodynamically stable, still on aspirin  and Plavix   Overall clinical picture discussed with the patient and I recommended a video capsule study tomorrow.  She was agreeable after discussion of the procedure and risks (including but not limited to the approximately 1% risk of capsule retention) Recent EGD had no stricture or hiatal hernia that would be expected to cause impairment to capsule passage.  Recommend discontinuing Plavix  today in case capsule findings prompt need for small bowel enteroscopy during this admission.  Best to be off antiplatelet agents at least 3 to 4 days before endoscopic intervention if possible. I messaged Dr. Macarthur Savory regarding discontinuing Plavix , and he was agreeable.   Kerby Pearson III Office:[phone]

## 2023-12-24 NOTE — Care Management Obs Status (Signed)
 MEDICARE OBSERVATION STATUS NOTIFICATION   Patient Details  Name: DESTYNIE TOOMEY MRN: [id number] Date of Birth: Aug 08, 1945   Medicare Observation Status Notification Given:  Yes  Moon/Obs letter copy to be email to patients daugther  Wynonia Hedges 12/24/2023, 11:16 AM

## 2023-12-24 NOTE — H&P (Addendum)
 History and Physical  Anita Mccormick NWG:[id number] DOB: 1946-01-18 DOA: 12/23/2023  PCP: Viola Greulich, MD   Chief Complaint: Anemia, SOB, chest pressure  HPI: Anita Mccormick is a 78 y.o. female with medical history significant for chronic diastolic HF, type 2 diabetes, HTN, HLD, CVA, CAD/NSTEMI, NAFLD/cirrhosis, CKD stage 2, breast cancer and iron  deficiency anemia requiring transfusions who was sent to the ED by her PCP for low hemoglobin.  Patient had a 1 month hospitalization from 3/18 - 4/16 for acute hypoxic respiratory failure in the setting of flu infection which was complicated by cardiac arrest.  Patient reports that since discharge, she has had some difficulty taking deep breaths as well as chest pressure due to the rib fractures from CPR.  She presented to her second hospital follow-up at her PCP office today and lab work obtained showed significant drop in hemoglobin to 5.9 from 8.0 2 weeks ago.  She was advised to present to the ED for further management of her anemia.  Patient also reports having persistent dry cough as well as fatigue and mild nausea but no vomiting, palpitations, dizziness, orthopnea, abdominal pain or dysuria.  She reports having black stools due to her iron  supplementation but noticed some blood after wiping the last few days.  ED Course: Initial vitals shows patient afebrile, RR 18, HR 92, BP 140/61, SpO2 100% on room air.  Initial labs significant for WBC 3.9, Hgb 5.4, platelet 108, troponin 99-81, blood glucose 439, BUN/creatinine 13/1.13, BNP 912, positive fecal occult test, negative INR, COVID, RSV and flu test. An order of 1 unit PRBC was placed.  TRH was consulted for admission. EDP asked to consult GI for evaluation.  Review of Systems: Please see HPI for pertinent positives and negatives. A complete 10 system review of systems are otherwise negative.  Past Medical History:  Diagnosis Date   Arthritis    Cancer (HCC)    breast left   Cerebrovascular  accident [facility])    October 29 2021- Left eye- blind   COLONIC POLYPS, HX OF 12/10/2006   Qualifier: Diagnosis of  By: Penney Bowling MD, Bruce     Diabetes mellitus type II, controlled (HCC) 12/10/2006   Poor control on Januvia  100mg  and glipizide  10mg  XL Lab Results  Component Value Date   HGBA1C 8.3* 11/02/2014        Eczema    Fatty liver disease, nonalcoholic 07/14/2014   Per Dr. Penney Bowling Lab Results  Component Value Date   ALT 31 11/02/2014   AST 57* 11/02/2014   ALKPHOS 104 11/02/2014   BILITOT 1.1 11/02/2014       GERD (gastroesophageal reflux disease)    History of blood transfusion    History of kidney stones 2023   11/28/21 small stone currently, taking flomax    Hypertension    Iron  deficiency anemia, unspecified 05/08/2023   Sepsis (HCC) 09/19/2017   Sepsis due to Escherichia coli (E. coli) (HCC)    Symptomatic anemia 06/06/2021   UTI (urinary tract infection) 09/19/2017   Vaginal discharge 04/01/2022   Past Surgical History:  Procedure Laterality Date   ARTERY BIOPSY Left 11/29/2021   Procedure: LEFT BIOPSY TEMPORAL ARTERY;  Surgeon: Dannis Dy, MD;  Location: [facility] OR;  Service: Vascular;  Laterality: Left;   BIOPSY OF SKIN SUBCUTANEOUS TISSUE AND/OR MUCOUS MEMBRANE  11/10/2023   Procedure: BIOPSY;  Surgeon: Daina Drum, MD;  Location: [facility] ENDOSCOPY;  Service: Gastroenterology;;   CATARACT EXTRACTION Bilateral    COLONOSCOPY N/A 11/10/2023  Procedure: COLONOSCOPY;  Surgeon: Daina Drum, MD;  Location: [facility] ENDOSCOPY;  Service: Gastroenterology;  Laterality: N/A;   ESOPHAGOGASTRODUODENOSCOPY N/A 11/10/2023   Procedure: EGD (ESOPHAGOGASTRODUODENOSCOPY);  Surgeon: Daina Drum, MD;  Location: [facility] ENDOSCOPY;  Service: Gastroenterology;  Laterality: N/A;   HOT HEMOSTASIS N/A 11/10/2023   Procedure: EGD, WITH ARGON PLASMA COAGULATION;  Surgeon: Daina Drum, MD;  Location: [facility] ENDOSCOPY;  Service: Gastroenterology;  Laterality: N/A;   LEFT HEART CATH AND CORONARY ANGIOGRAPHY N/A  10/20/2023   Procedure: LEFT HEART CATH AND CORONARY ANGIOGRAPHY;  Surgeon: [place], Peter M, MD;  Location: [facility] INVASIVE CV LAB;  Service: Cardiovascular;  Laterality: N/A;   MASTECTOMY Left    POLYPECTOMY  11/10/2023   Procedure: POLYPECTOMY, INTESTINE;  Surgeon: Daina Drum, MD;  Location: [facility] ENDOSCOPY;  Service: Gastroenterology;;   TUBAL LIGATION     Social History:  reports that she has been smoking cigarettes. She has a 2 pack-year smoking history. She has never used smokeless tobacco. She reports that she does not currently use alcohol. She reports that she does not use drugs.  Allergies  Allergen Reactions   Bee Venom Swelling   Penicillins Hives   Ace Inhibitors Cough    ????   Chlorthalidone     REACTION: unspecified   Lactose Intolerance (Gi) Diarrhea   Metformin      REACTION: gi side effects   Sulfamethoxazole     REACTION: questionable    Family History  Problem Relation Age of Onset   Stroke Mother    Cancer Father        prostate   Diabetes Sister    Pancreatic cancer Sister      Prior to Admission medications   Medication Sig Start Date End Date Taking? Authorizing Provider  acetaminophen  (TYLENOL ) 325 MG tablet Take 2 tablets (650 mg total) by mouth every 6 (six) hours as needed for mild pain (pain score 1-3) (or Fever >/= 101). Patient taking differently: Take 325 mg by mouth every 6 (six) hours as needed for mild pain (pain score 1-3) (or Fever >/= 101). 10/27/23  Yes Krishnan, Gokul, MD  aspirin  EC 81 MG tablet Take 81 mg by mouth daily. Swallow whole.   Yes [provider]  Budeson-Glycopyrrol-Formoterol  (BREZTRI AEROSPHERE IN) Inhale 2 puffs into the lungs 2 (two) times daily.   Yes [provider]  albuterol  (VENTOLIN  HFA) 108 (90 Base) MCG/ACT inhaler Inhale 2 puffs into the lungs every 6 (six) hours as needed for wheezing or shortness of breath. 11/11/23  Yes Gonfa, Taye T, MD  atorvastatin  (LIPITOR ) 80 MG tablet Take 1 tablet (80 mg  total) by mouth daily. 12/09/23   Viola Greulich, MD  clopidogrel  (PLAVIX ) 75 MG tablet Take 1 tablet (75 mg total) by mouth daily. 12/14/23   Viola Greulich, MD  Dulaglutide  (TRULICITY ) 1.5 MG/0.5ML SOAJ Inject 1.5 mg into the skin once a week. 12/23/23   [provider]  ferrous gluconate  (FERGON) 324 MG tablet Take 1 tablet (324 mg total) by mouth daily. 04/23/23   Viola Greulich, MD  glipiZIDE  (GLUCOTROL  XL) 10 MG 24 hr tablet TAKE 1 TABLET TWICE DAILY 12/09/23   Viola Greulich, MD  metoprolol  succinate (TOPROL -XL) 25 MG 24 hr tablet Take 1 tablet (25 mg total) by mouth daily. 12/14/23   Viola Greulich, MD  Multiple Vitamin (MULTIVITAMIN) tablet Take 1 tablet by mouth daily.    [provider]  nitroGLYCERIN  (NITROSTAT ) 0.4 MG SL tablet Place 1  tablet (0.4 mg total) under the tongue every 5 (five) minutes as needed for chest pain. 11/18/23 11/17/24  Viola Greulich, MD  pantoprazole  (PROTONIX ) 40 MG tablet Take 1 tablet (40 mg total) by mouth daily. 12/14/23   Viola Greulich, MD    Physical Exam: BP (!) 137/49   Pulse 85   Temp 98.8 F (37.1 C) (Oral)   Resp 16   Ht 5\' 2"  (1.575 m)   Wt 66.5 kg   SpO2 100%   BMI 26.81 kg/m  General: Pleasant, well-appearing elderly woman laying in bed. No acute distress. HEENT: [place]/AT. Anicteric sclera. Pale conjunctiva and hard palate. CV: RRR. No murmurs, rubs, or gallops. Trace BLE edema Pulmonary: Lungs CTAB. Normal effort. No wheezing. Bibasilar rales. Decreased air movement throughout.  Abdominal: Soft, nontender, nondistended. Normal bowel sounds. Extremities: Palpable radial and DP pulses. Normal ROM. Skin: Warm and dry. No obvious rash or lesions. Neuro: A&Ox3. Moves all extremities. Normal sensation to light touch. No focal deficit. Psych: Normal mood and affect          Labs on Admission:  Basic Metabolic Panel: Recent Labs  Lab 12/23/23 1356 12/23/23 1900  NA 136 138  K 4.0 4.2  CL 105 107  CO2 25 20*   GLUCOSE 366* 439*  BUN 15 13  CREATININE 0.98 1.13*  CALCIUM  9.0 8.9   Liver Function Tests: Recent Labs  Lab 12/23/23 1356  AST 17  ALT 15  ALKPHOS 108  BILITOT 0.7  PROT 6.0  ALBUMIN  3.6   No results for input(s): "LIPASE", "AMYLASE" in the last 168 hours. No results for input(s): "AMMONIA" in the last 168 hours. CBC: Recent Labs  Lab 12/23/23 1356 12/23/23 2025  WBC 3.8* 3.9*  NEUTROABS 2.7 2.4  HGB 5.9 Repeated and verified X2.* 5.4*  HCT 19.1 Repeated and verified X2.* 18.5*  MCV 97.0 103.9*  PLT 114.0* 108*   Cardiac Enzymes: No results for input(s): "CKTOTAL", "CKMB", "CKMBINDEX", "TROPONINI" in the last 168 hours. BNP (last 3 results) Recent Labs    10/13/23 0944 10/13/23 1853 12/23/23 2025  BNP 205.1* 1,372.6* 912.6*    ProBNP (last 3 results) Recent Labs    12/23/23 1356  PROBNP 880.0*    CBG: No results for input(s): "GLUCAP" in the last 168 hours.  Radiological Exams on Admission: CT ANGIO GI BLEED Result Date: 12/23/2023 CLINICAL DATA:  Hemoglobin 5.9 with history of diverticular disease, fecal occult positive. EXAM: CTA ABDOMEN AND PELVIS WITHOUT AND WITH CONTRAST TECHNIQUE: Multidetector CT imaging of the abdomen and pelvis was performed using the standard protocol during bolus administration of intravenous contrast. Multiplanar reconstructed images and MIPs were obtained and reviewed to evaluate the vascular anatomy. RADIATION DOSE REDUCTION: This exam was performed according to the departmental dose-optimization program which includes automated exposure control, adjustment of the mA and/or kV according to patient size and/or use of iterative reconstruction technique. CONTRAST:  70mL OMNIPAQUE  IOHEXOL  350 MG/ML SOLN COMPARISON:  CT abdomen pelvis 09/18/2021 FINDINGS: VASCULAR Aorta: Calcified atherosclerosis. No hemodynamically significant stenosis. No aneurysm or dissection. Celiac: Calcified plaque at the origin. No hemodynamically significant  stenosis, aneurysm, or dissection. SMA: Calcified plaque at the origin without hemodynamically significant stenosis. No aneurysm or dissection. Renals: Single right and dual left renal arteries. Mild narrowing of the superior proximal left renal artery. No aneurysm or dissection. IMA: Patent. Inflow: Scattered calcified atherosclerotic plaque without hemodynamically significant stenosis. No aneurysm or dissection. Proximal Outflow: Atherosclerotic plaque without hemodynamically significant stenosis, aneurysm, or dissection.  Veins: Patent portal veins and IVC.  Periesophageal varices. Review of the MIP images confirms the above findings. NON-VASCULAR Lower chest: Patchy ground-glass opacities in the lower lobes are favored infectious/inflammatory. 8 mm right lower lobe nodule on series 12/image 134. Hepatobiliary: Nodular patent contour. Nondistended thick-walled bladder likely due to hepatic cirrhosis. No biliary dilation or radiopaque stone. Pancreas: Unremarkable. Spleen: Unremarkable. Adrenals/Urinary Tract: Normal adrenal glands. No urinary calculi or hydronephrosis. Unremarkable bladder. Stomach/Bowel: Stomach is within normal limits. Normal caliber large and small bowel. Colonic diverticulosis without diverticulitis. No bowel wall thickening. Normal appendix. Lymphatic: No lymphadenopathy. Reproductive: Uterus and bilateral adnexa are unremarkable. Other: Trace free fluid in the pelvis. No free intraperitoneal air. Ventral hernia containing fat in the left paramedian region inferior to the umbilicus. This is similar to prior. Musculoskeletal: No acute fracture.  Body wall anasarca. IMPRESSION: 1. No evidence of GI bleed. 2. Colonic diverticulosis without diverticulitis. 3. Hepatic cirrhosis with sequela of portal hypertension including periesophageal varices. 4. Patchy ground-glass opacities in the lower lobes are favored infectious/inflammatory. 5. 8 mm right lower lobe nodule. Non-contrast chest CT at 6-12  months is recommended. If the nodule is stable at time of repeat CT, then future CT at 18-24 months (from today's scan) is considered optional for low-risk patients, but is recommended for high-risk patients. This recommendation follows the consensus statement: Guidelines for Management of Incidental Pulmonary Nodules Detected on CT Images: From the Fleischner Society 2017; Radiology 2017; 284:228-243. 6. Aortic Atherosclerosis (ICD10-I70.0). 7. Ventral hernia containing fat in the left paramedian region inferior to the umbilicus. This is similar to prior. Electronically Signed   By: Rozell Cornet M.D.   On: 12/23/2023 21:55   DG Chest 2 View Result Date: 12/23/2023 CLINICAL DATA:  Shortness of breath. EXAM: CHEST - 2 VIEW COMPARISON:  Chest radiograph dated 10/23/2023 FINDINGS: Mild central vascular congestion. Indeterminate opacity over the right lung base may be superimposed on the patient. No pleural effusion pneumothorax. Stable cardiac silhouette no acute osseous pathology. IMPRESSION: 1. Mild central vascular congestion. 2. Indeterminate opacity over the right lung base may be superimposed on the patient. Recommend repeat radiograph with better positioning of the patient. Electronically Signed   By: Angus Bark M.D.   On: 12/23/2023 19:51   Assessment/Plan Anita Mccormick is a 78 y.o. female with medical history significant for chronic diastolic HF, type 2 diabetes, HTN, HLD, CVA, CAD/NSTEMI, NAFLD/cirrhosis, CKD stage 2, breast cancer and iron  deficiency anemia requiring transfusions who was sent to the ED by her PCP for low hemoglobin and admitted for acute on chronic anemia.  # Acute on chronic anemia # GI bleed - Significant drop in hemoglobin from 8.0 2 weeks ago to 5.4 on arrival - Endorses melena that is unchanged over the last few weeks - Positive fecal occult test, CTA GI bleed study negative for active bleed - Concern for possible upper GI bleed in the setting of identified  angioectasias and gastritis on April 15th EGD versus worsening iron  deficiency anemia - Status post 1 unit PRBC in the ED - Give additional 1 unit PRBC preceded by IV Lasix  20 mg x 1 - Follow-up posttransfusion H&H and iron  studies from PCP office - IV Protonix  40 mg every 12 hours - EDP asked to consult GI, please follow-up in the morning  # Acute on chronic diastolic HF - Patient presented with shortness of breath and chest pressure - CXR shows mild vascular congestion, BNP elevated to 912 - Remains on room air with no  respiratory distress and not significantly fluid overloaded - IV Lasix  20 mg prior to next blood transfusion - Continue Toprol -XL - Strict I&O's, daily weight - Supplemental O2 as needed  # T2DM - Last A1c 5.0% 2 months ago - Blood glucose significantly elevated to 439 BMP - Every 4 SSI with CBG monitoring - Continue glipizide  - Repeat A1c  # CAD/NSTEMI # Hx of CVA # HLD - Reports some chest pressure and tightness since the CPR from last hospitalization - Troponin slightly elevated but flat, EKG without any ischemic changes - Continue ASA, atorvastatin  and Plavix  - Continue as needed nitroglycerin  tabs   DVT prophylaxis: TED hose    Code Status: Full Code  Consults called: GI  Family Communication: No family at bedside  Severity of Illness: The appropriate patient status for this patient is OBSERVATION. Observation status is judged to be reasonable and necessary in order to provide the required intensity of service to ensure the patient's safety. The patient's presenting symptoms, physical exam findings, and initial radiographic and laboratory data in the context of their medical condition is felt to place them at decreased risk for further clinical deterioration. Furthermore, it is anticipated that the patient will be medically stable for discharge from the hospital within 2 midnights of admission.   Level of care: Telemetry Medical   This record has  been created using Conservation officer, historic buildings. Errors have been sought and corrected, but may not always be located. Such creation errors do not reflect on the standard of care.   Vita Grip, MD 12/24/2023, 12:28 AM [facility] Pager: [phone] Isaiah 41:10   If 7PM-7AM, please contact night-coverage [email or web address] Password [email or web address]

## 2023-12-24 NOTE — Care Management Obs Status (Signed)
 MEDICARE OBSERVATION STATUS NOTIFICATION   Patient Details  Name: Anita Mccormick MRN: [id number] Date of Birth: [birth year]   Medicare Observation Status Notification Given:  Yes    Dane Dung, RN 12/24/2023, 2:12 PM

## 2023-12-25 ENCOUNTER — Encounter (HOSPITAL_COMMUNITY)
Admission: EM | Disposition: A | Discharge status: Home / Self Care | Payer: Self-pay | Source: Home / Self Care | Attending: Emergency Medicine

## 2023-12-25 DIAGNOSIS — I5033 Acute on chronic diastolic (congestive) heart failure: Secondary | ICD-10-CM | POA: Diagnosis not present

## 2023-12-25 DIAGNOSIS — K31811 Angiodysplasia of stomach and duodenum with bleeding: Secondary | ICD-10-CM | POA: Diagnosis not present

## 2023-12-25 DIAGNOSIS — R195 Other fecal abnormalities: Secondary | ICD-10-CM

## 2023-12-25 DIAGNOSIS — D696 Thrombocytopenia, unspecified: Secondary | ICD-10-CM | POA: Diagnosis not present

## 2023-12-25 DIAGNOSIS — D500 Iron deficiency anemia secondary to blood loss (chronic): Secondary | ICD-10-CM | POA: Diagnosis not present

## 2023-12-25 DIAGNOSIS — R1310 Dysphagia, unspecified: Secondary | ICD-10-CM | POA: Diagnosis not present

## 2023-12-25 DIAGNOSIS — D649 Anemia, unspecified: Secondary | ICD-10-CM | POA: Diagnosis not present

## 2023-12-25 HISTORY — PX: GIVENS CAPSULE STUDY: SHX5432

## 2023-12-25 LAB — CBC
HCT: 31.1 % — ABNORMAL LOW (ref 36.0–46.0)
Hemoglobin: 9.6 g/dL — ABNORMAL LOW (ref 12.0–15.0)
MCH: 28.7 pg (ref 26.0–34.0)
MCHC: 30.9 g/dL (ref 30.0–36.0)
MCV: 92.8 fL (ref 80.0–100.0)
Platelets: 127 10*3/uL — ABNORMAL LOW (ref 150–400)
RBC: 3.35 MIL/uL — ABNORMAL LOW (ref 3.87–5.11)
RDW: 23.9 % — ABNORMAL HIGH (ref 11.5–15.5)
WBC: 5.9 10*3/uL (ref 4.0–10.5)
nRBC: 0 % (ref 0.0–0.2)

## 2023-12-25 LAB — BASIC METABOLIC PANEL WITH GFR
Anion gap: 7 (ref 5–15)
BUN: 7 mg/dL — ABNORMAL LOW (ref 8–23)
CO2: 25 mmol/L (ref 22–32)
Calcium: 8.4 mg/dL — ABNORMAL LOW (ref 8.9–10.3)
Chloride: 107 mmol/L (ref 98–111)
Creatinine, Ser: 1.03 mg/dL — ABNORMAL HIGH (ref 0.44–1.00)
GFR, Estimated: 56 mL/min — ABNORMAL LOW (ref >60)
Glucose, Bld: 122 mg/dL — ABNORMAL HIGH (ref 70–99)
Potassium: 3.4 mmol/L — ABNORMAL LOW (ref 3.5–5.1)
Sodium: 139 mmol/L (ref 135–145)

## 2023-12-25 LAB — GLUCOSE, CAPILLARY
Glucose-Capillary: 135 mg/dL — ABNORMAL HIGH (ref 70–99)
Glucose-Capillary: 139 mg/dL — ABNORMAL HIGH (ref 70–99)
Glucose-Capillary: 165 mg/dL — ABNORMAL HIGH (ref 70–99)
Glucose-Capillary: 172 mg/dL — ABNORMAL HIGH (ref 70–99)
Glucose-Capillary: 303 mg/dL — ABNORMAL HIGH (ref 70–99)
Glucose-Capillary: 294 mg/dL — ABNORMAL HIGH (ref 70–99)
Glucose-Capillary: 138 mg/dL — ABNORMAL HIGH (ref 70–99)

## 2023-12-25 LAB — BPAM RBC
Blood Product Expiration Date: 202505312359
Blood Product Expiration Date: 202506242359
ISSUE DATE / TIME: 202505290540
ISSUE DATE / TIME: 202505282228
Unit Type and Rh: 9500
Unit Type and Rh: 7300

## 2023-12-25 LAB — TYPE AND SCREEN
ABO/RH(D): B POS
Antibody Screen: NEGATIVE
Unit division: 0
Unit division: 0

## 2023-12-25 LAB — SURGICAL PCR SCREEN
MRSA, PCR: NEGATIVE
Staphylococcus aureus: NEGATIVE

## 2023-12-25 LAB — MAGNESIUM: Magnesium: 1.8 mg/dL (ref 1.7–2.4)

## 2023-12-25 SURGERY — IMAGING PROCEDURE, GI TRACT, INTRALUMINAL, VIA CAPSULE
Anesthesia: LOCAL

## 2023-12-25 SURGICAL SUPPLY — 1 items: TOWEL COTTON PACK 4EA (MISCELLANEOUS) ×4 IMPLANT

## 2023-12-25 NOTE — Progress Notes (Signed)
 Progress Note   Patient: Anita Mccormick CZY:[id number] DOB: July 27, 1946 DOA: 12/23/2023     0 DOS: the patient was seen and examined on 12/25/2023   Brief hospital course:  78 y.o. female with medical history significant for chronic diastolic HF, type 2 diabetes, HTN, HLD, CVA, CAD/NSTEMI on DAPT, NAFLD/cirrhosis, CKD stage 2, breast cancer and iron  deficiency anemia requiring transfusions who was sent to the ED by her PCP for low hemoglobin.   Assessment and Plan:  Acute on chronic blood loss anemia - Hemoglobin 8.4 on presentation.  8.02 weeks prior.  S/p 2 units PRBCs.  Hemoglobin stable this morning.  Will recheck hemoglobin later this evening and in AM.  Transfuse if less than 7.  Upper GI bleed - Known history of AVMs/angioectasias from EGD 4/15.  GI consulted and following closely.  DAPT on hold for now.  Status post capsule endoscopy this morning 5/30.  IV Protonix  twice daily IV on board.  CAD s/p PEA arrest and NSTEMI 09/2023 - DAPT on hold.  Cardiac cath performed 3/25 showing three-vessel CAD with medical management recommendation.  Not a candidate for CABG and PCI given underlying comorbidities.  Plavix  recommended x 1 year at the time.  Currently on hold given ongoing GI bleed.  Will need to have discussion with patient, family, GI, on risk/benefits of resuming DAPT.  Acute exacerbation of chronic HFpEF - Chest x-ray noting mild vascular congestion.  Responded well to IV Lasix .  Currently not hypoxic, respiratory distress likely related to anemia.  Continue to monitor urine output recheck BMP in AM.  Diabetes mellitus - Insulin  sliding scale on board.  Goals of care - Awaiting results of capsule endoscopy as well as ongoing bleed.  Unfortunately patient has known CAD with NSTEMI and recommendations for DAPT along with known angioectasias with significant GI bleeding.  Will need discussion with patient/family about goals going forward.  Subjective: Patient sitting up in bed,  comfortable this morning.  Denies any fever, chills, shortness of breath, chest pain, nausea, vomiting, abdominal pain.  Tolerated swallowing endoscopy capsule this morning.  Eager to eat when possible.  Physical Exam: Vitals:   12/25/23 0406 12/25/23 0408 12/25/23 0743 12/25/23 0802  BP: (!) 121/44 (!) 121/44  135/65  Pulse: 71 85  73  Resp: 16 16  19   Temp: 98.8 F (37.1 C) 98.8 F (37.1 C)  99.2 F (37.3 C)  TempSrc: Oral Oral  Oral  SpO2: 100% 100%  99%  Weight:   66.7 kg   Height:   5\' 2"  (1.575 m)     GENERAL:  Alert, pleasant, no acute distress  HEENT:  EOMI CARDIOVASCULAR:  RRR, no murmurs appreciated RESPIRATORY:  Clear to auscultation, no wheezing, rales, or rhonchi GASTROINTESTINAL:  Soft, nontender, nondistended EXTREMITIES: Mild LE edema bilaterally NEURO:  No new focal deficits appreciated SKIN:  No rashes noted PSYCH:  Appropriate mood and affect     Data Reviewed:  No new imaging to review  Previous records (including but not limited to H&P, progress notes, nursing notes, TOC management) were reviewed in assessment of this patient.  Labs: CBC: Recent Labs  Lab 12/23/23 1356 12/23/23 2025 12/24/23 1404  WBC 3.8* 3.9* 6.6  NEUTROABS 2.7 2.4  --   HGB 5.9 Repeated and verified X2.* 5.4* 9.0*  HCT 19.1 Repeated and verified X2.* 18.5* 29.9*  MCV 97.0 103.9* 91.4  PLT 114.0* 108* 128*   Basic Metabolic Panel: Recent Labs  Lab 12/23/23 1356 12/23/23 1900 12/24/23 1404  NA 136 138 140  K 4.0 4.2 3.9  CL 105 107 108  CO2 25 20* 25  GLUCOSE 366* 439* 138*  BUN 15 13 10   CREATININE 0.98 1.13* 1.06*  CALCIUM  9.0 8.9 9.1   Liver Function Tests: Recent Labs  Lab 12/23/23 1356 12/24/23 1404  AST 17 24  ALT 15 18  ALKPHOS 108 82  BILITOT 0.7 4.6*  PROT 6.0 5.9*  ALBUMIN  3.6 3.1*   CBG: Recent Labs  Lab 12/24/23 1631 12/24/23 1957 12/24/23 2354 12/25/23 0419 12/25/23 0806  GLUCAP 157* 266* 94 135* 139*    Scheduled Meds:   atorvastatin   80 mg Oral Daily   glipiZIDE   10 mg Oral BID WC   insulin  aspart  0-15 Units Subcutaneous Q4H   metoprolol  succinate  25 mg Oral Daily   multivitamin with minerals  1 tablet Oral Daily   pantoprazole  (PROTONIX ) IV  40 mg Intravenous Q12H   sodium chloride  flush  10-40 mL Intracatheter Q12H   Continuous Infusions: PRN Meds:.acetaminophen  **OR** acetaminophen , ipratropium-albuterol , nitroGLYCERIN , ondansetron  **OR** ondansetron  (ZOFRAN ) IV, senna-docusate, sodium chloride  flush  Family Communication: None at bedside  Disposition: Status is: Observation The patient remains OBS appropriate and will d/c before 2 midnights.     Time spent: 38 minutes  Length of stay: 0 days  Author: Jodeane Mulligan, DO 12/25/2023 10:57 AM  For on call review [email or web address].

## 2023-12-25 NOTE — Plan of Care (Signed)

## 2023-12-25 NOTE — Progress Notes (Signed)
 Heart Failure Navigator Progress Note  Assessed for Heart & Vascular TOC clinic readiness.  Patient does not meet criteria due to EF 60-65%, admitted for Anemia requiring transfusions. No HF TOC. .   Navigator will sign off at this time.   Randie Bustle, BSN, Scientist, clinical (histocompatibility and immunogenetics) Only

## 2023-12-25 NOTE — Progress Notes (Signed)
 [facility] capsule endoscopy ordered by MD Danis.  Patient ingested capsule at 0750hrs.  Per Given's capsule instructions, patient to remain NPO until 0950hrs at which time they may progress to clear liquid diet. At 1150hrs patient may have a small snack such as a half a sandwich or a bowl of soup. At 1550hrs patient may progress to previously ordered diet.  The capsule endoscopy study will conclude at 1950hrs at which time the recorder and leads or belt can be removed and placed in a patient belongings bag. Endoscopy staff will pick up the equipment in the AM.  Instructions provided to patient and inpatient RN. Patient and RN demonstrated understanding.

## 2023-12-25 NOTE — Progress Notes (Signed)
 Patient monitor, belt/leads was removed and placed in bag in patient room for pick up in the morning.

## 2023-12-26 DIAGNOSIS — K5520 Angiodysplasia of colon without hemorrhage: Secondary | ICD-10-CM

## 2023-12-26 DIAGNOSIS — K31811 Angiodysplasia of stomach and duodenum with bleeding: Secondary | ICD-10-CM | POA: Diagnosis not present

## 2023-12-26 DIAGNOSIS — D500 Iron deficiency anemia secondary to blood loss (chronic): Secondary | ICD-10-CM | POA: Diagnosis not present

## 2023-12-26 DIAGNOSIS — K31819 Angiodysplasia of stomach and duodenum without bleeding: Secondary | ICD-10-CM

## 2023-12-26 DIAGNOSIS — R1310 Dysphagia, unspecified: Secondary | ICD-10-CM | POA: Diagnosis not present

## 2023-12-26 DIAGNOSIS — D696 Thrombocytopenia, unspecified: Secondary | ICD-10-CM | POA: Diagnosis not present

## 2023-12-26 DIAGNOSIS — I5033 Acute on chronic diastolic (congestive) heart failure: Secondary | ICD-10-CM | POA: Diagnosis not present

## 2023-12-26 DIAGNOSIS — D649 Anemia, unspecified: Secondary | ICD-10-CM | POA: Diagnosis not present

## 2023-12-26 DIAGNOSIS — R195 Other fecal abnormalities: Secondary | ICD-10-CM | POA: Diagnosis not present

## 2023-12-26 LAB — BASIC METABOLIC PANEL WITH GFR
Anion gap: 6 (ref 5–15)
BUN: 8 mg/dL (ref 8–23)
CO2: 25 mmol/L (ref 22–32)
Calcium: 8.2 mg/dL — ABNORMAL LOW (ref 8.9–10.3)
Chloride: 110 mmol/L (ref 98–111)
Creatinine, Ser: 1.02 mg/dL — ABNORMAL HIGH (ref 0.44–1.00)
GFR, Estimated: 57 mL/min — ABNORMAL LOW (ref >60)
Glucose, Bld: 148 mg/dL — ABNORMAL HIGH (ref 70–99)
Potassium: 3.5 mmol/L (ref 3.5–5.1)
Sodium: 141 mmol/L (ref 135–145)

## 2023-12-26 LAB — GLUCOSE, CAPILLARY
Glucose-Capillary: 103 mg/dL — ABNORMAL HIGH (ref 70–99)
Glucose-Capillary: 149 mg/dL — ABNORMAL HIGH (ref 70–99)
Glucose-Capillary: 153 mg/dL — ABNORMAL HIGH (ref 70–99)
Glucose-Capillary: 161 mg/dL — ABNORMAL HIGH (ref 70–99)
Glucose-Capillary: 189 mg/dL — ABNORMAL HIGH (ref 70–99)
Glucose-Capillary: 242 mg/dL — ABNORMAL HIGH (ref 70–99)

## 2023-12-26 LAB — CBC
HCT: 29.7 % — ABNORMAL LOW (ref 36.0–46.0)
Hemoglobin: 8.9 g/dL — ABNORMAL LOW (ref 12.0–15.0)
MCH: 28.3 pg (ref 26.0–34.0)
MCHC: 30 g/dL (ref 30.0–36.0)
MCV: 94.3 fL (ref 80.0–100.0)
Platelets: 125 10*3/uL — ABNORMAL LOW (ref 150–400)
RBC: 3.15 MIL/uL — ABNORMAL LOW (ref 3.87–5.11)
RDW: 23.1 % — ABNORMAL HIGH (ref 11.5–15.5)
WBC: 4.9 10*3/uL (ref 4.0–10.5)
nRBC: 0 % (ref 0.0–0.2)

## 2023-12-26 LAB — MAGNESIUM: Magnesium: 1.9 mg/dL (ref 1.7–2.4)

## 2023-12-26 MED ORDER — CLOPIDOGREL BISULFATE 75 MG PO TABS
75.0000 mg | ORAL_TABLET | ORAL | Status: DC
  Administered 2023-12-26: 75 mg via ORAL
  Filled 2023-12-26: qty 1

## 2023-12-26 MED ORDER — INSULIN ASPART 100 UNIT/ML IJ SOLN
0.0000 [IU] | Freq: Three times a day (TID) | INTRAMUSCULAR | Status: DC
  Administered 2023-12-27 (×2): 5 [IU] via SUBCUTANEOUS

## 2023-12-26 NOTE — Progress Notes (Signed)
 [facility] night cross cover note:   I was notified by the patient's RN that this patient is tolerating her heart healthy/carb modified diet.  At this time, I will change her existing CBG/sliding scale insulin  orders from every 4 hours to ACHS.      Camelia Cavalier, DO Hospitalist

## 2023-12-26 NOTE — Progress Notes (Signed)
 [facility] Progress Note  Chief Complaint: Chronic blood loss anemia  History:  Anita Mccormick is feeling well today with no abdominal pain, hematemesis melena or hematochezia. Anxious to hear results of the capsule study ROS: Cardiovascular: No chest pain Respiratory: No dyspnea Urinary: No dysuria  Objective:   Current Facility-Administered Medications:    acetaminophen  (TYLENOL ) tablet 650 mg, 650 mg, Oral, Q6H PRN **OR** acetaminophen  (TYLENOL ) suppository 650 mg, 650 mg, Rectal, Q6H PRN, Yvonne Hering, Annette Killings, MD   atorvastatin  (LIPITOR ) tablet 80 mg, 80 mg, Oral, Daily, Vita Grip, MD, 80 mg at 12/26/23 0856   glipiZIDE  (GLUCOTROL  XL) 24 hr tablet 10 mg, 10 mg, Oral, BID WC, [place], Prosper M, MD, 10 mg at 12/26/23 1610   insulin  aspart (novoLOG ) injection 0-15 Units, 0-15 Units, Subcutaneous, Q4H, Vita Grip, MD, 5 Units at 12/26/23 1224   ipratropium-albuterol  (DUONEB) 0.5-2.5 (3) MG/3ML nebulizer solution 3 mL, 3 mL, Nebulization, Q6H PRN, Yvonne Hering, Annette Killings, MD   metoprolol  succinate (TOPROL -XL) 24 hr tablet 25 mg, 25 mg, Oral, Daily, Vita Grip, MD, 25 mg at 12/26/23 9604   multivitamin with minerals tablet 1 tablet, 1 tablet, Oral, Daily, Vita Grip, MD, 1 tablet at 12/26/23 5409   nitroGLYCERIN  (NITROSTAT ) SL tablet 0.4 mg, 0.4 mg, Sublingual, Q5 min PRN, Yvonne Hering, Annette Killings, MD   ondansetron  (ZOFRAN ) tablet 4 mg, 4 mg, Oral, Q6H PRN **OR** ondansetron  (ZOFRAN ) injection 4 mg, 4 mg, Intravenous, Q6H PRN, Yvonne Hering, Annette Killings, MD   pantoprazole  (PROTONIX ) injection 40 mg, 40 mg, Intravenous, Q12H, Vita Grip, MD, 40 mg at 12/26/23 0856   senna-docusate (Senokot-S) tablet 1 tablet, 1 tablet, Oral, QHS PRN, Vita Grip, MD   sodium chloride  flush (NS) 0.9 % injection 10-40 mL, 10-40 mL, Intracatheter, Q12H, Vita Grip, MD, 10 mL at 12/26/23 0857   sodium chloride  flush (NS) 0.9 % injection 10-40 mL, 10-40 mL,  Intracatheter, PRN, Vita Grip, MD     Vital signs in last 24 hrs: Vitals:   12/26/23 0747 12/26/23 1221  BP: (!) 132/44 (!) 142/54  Pulse: 75 75  Resp: 19 19  Temp: 99 F (37.2 C) 98.6 F (37 C)  SpO2: 100% 100%   No intake or output data in the 24 hours ending 12/26/23 1542   Physical Exam  HEENT: sclera anicteric, oral mucosa without lesions Neck: supple, no thyromegaly, JVD or lymphadenopathy Cardiac: RRR without murmurs, S1S2 heard, no peripheral edema Pulm: clear to auscultation bilaterally, normal RR and effort noted Abdomen: soft, no tenderness, with active bowel sounds. No guarding or palpable hepatosplenomegaly Skin; warm and dry, no jaundice  Recent Labs:     Latest Ref Rng & Units 12/26/2023    4:37 AM 12/25/2023    8:49 AM 12/24/2023    2:04 PM  CBC  WBC 4.0 - 10.5 K/uL 4.9  5.9  6.6   Hemoglobin 12.0 - 15.0 g/dL 8.9  9.6  9.0   Hematocrit 36.0 - 46.0 % 29.7  31.1  29.9   Platelets 150 - 400 K/uL 125  127  128     Recent Labs  Lab 12/23/23 1900  INR 1.2      Latest Ref Rng & Units 12/26/2023    4:37 AM 12/25/2023    8:49 AM 12/24/2023    2:04 PM  CMP  Glucose 70 - 99 mg/dL 811  914  782   BUN 8 - 23 mg/dL 8  7  10    Creatinine  0.44 - 1.00 mg/dL 9.60  4.54  0.98   Sodium 135 - 145 mmol/L 141  139  140   Potassium 3.5 - 5.1 mmol/L 3.5  3.4  3.9   Chloride 98 - 111 mmol/L 110  107  108   CO2 22 - 32 mmol/L 25  25  25    Calcium  8.9 - 10.3 mg/dL 8.2  8.4  9.1   Total Protein 6.5 - 8.1 g/dL   5.9   Total Bilirubin 0.0 - 1.2 mg/dL   4.6   Alkaline Phos 38 - 126 U/L   82   AST 15 - 41 U/L   24   ALT 0 - 44 U/L   18     Video capsule study results:   (Full report will be scanned into the EHR eventually)  No bleeding lesions, fresh or old blood seen on limited views of the stomach  Small bowel visualization was good with usual inherent limitations due to motility as well as retained fluid and bubbles  No bleeding sources fresh or old  blood in the small bowel  Relatively fast small bowel transit time of 55 minutes.  Capsule was seen entering the cecum with photodocumentation  Assessment & Plan  Assessment: IDA of chronic blood loss Nonbleeding gastric and duodenal AVMs seen on recent EGD, no bleeding source on colonoscopy  Here with a worsening of that anemia while on oral iron .  No bleeding source seen on the capsule study, however it should be noted that with a relatively fast transit time, small findings such as AVMs (or other) may not be visualized.  Those could be a source of blood loss and IDA that is not amenable to endoscopic intervention.  No active bleeding was seen.  Also consider the possibility that oral and dietary iron  might not be sufficiently absorbed in some patients.  Patient's anemia dates back to at least mid 2024 which is before she was on DAPT.  Plan: Resume Plavix  today  This patient needs to establish care with hematology in the outpatient setting for close monitoring of her blood counts and iron  levels as well as regular dosing of IV iron .  Although she received some iron  in the PRBC transfusions this admission, a dedicated dose of IV iron  may also help her if deemed appropriate by the primary medical team.  Patient most likely cannot discontinue aspirin  or Plavix  with her cardiac history and catheterization showing three-vessel CAD that is not amenable to intervention.  With that plan outlined above, I think this patient can be discharged from the hospital as soon as tomorrow from a GI perspective.   Kerby Pearson III Office: [phone]

## 2023-12-26 NOTE — Plan of Care (Signed)

## 2023-12-26 NOTE — Progress Notes (Signed)
 Progress Note   Patient: Anita Mccormick UJW:[id number] DOB: Aug 13, 1945 DOA: 12/23/2023  DOS: the patient was seen and examined on 12/26/2023   Brief hospital course:  78 y.o. female with medical history significant for chronic diastolic HF, type 2 diabetes, HTN, HLD, CVA, CAD/NSTEMI on DAPT, NAFLD/cirrhosis, CKD stage 2, breast cancer and iron  deficiency anemia requiring transfusions who was sent to the ED by her PCP for low hemoglobin.    Assessment and Plan:   Acute on chronic blood loss anemia - Hemoglobin 5.4 on presentation.  S/p 2 units PRBCs.  Hemoglobin stable this morning.  Will recheck hemoglobin in AM.  Transfuse if less than 7.   Upper GI bleed - Known history of AVMs/angioectasias from EGD 4/15.  GI consulted and following closely.  DAPT on hold for now.  Status post capsule endoscopy 5/30.  Results still pending.  Will transition protonix  to PO.   CAD s/p PEA arrest and NSTEMI 09/2023 - DAPT on hold.  Cardiac cath performed 3/25 showing three-vessel CAD with medical management recommendation.  Not a candidate for CABG and PCI given underlying comorbidities.  Plavix  recommended x 1 year at the time.  Currently on hold given ongoing GI bleed.  Will need to have discussion with patient, family, GI, on risk/benefits of resuming DAPT.   Acute exacerbation of chronic HFpEF - Chest x-ray noting mild vascular congestion.  Responded well to IV Lasix .  Currently not hypoxic, respiratory distress likely related to anemia.  Continue to monitor urine output recheck BMP in AM.   Diabetes mellitus - Insulin  sliding scale on board.   Goals of care - Awaiting results of capsule endoscopy as well as ongoing bleed.  Unfortunately patient has known CAD with NSTEMI and recommendations for DAPT along with known angioectasias with significant GI bleeding.  Had a long talk with daughter via telephone 5/30 about risks of being on vs off DAPT concerning her bleeding and CAD/NSTEMI respectively.      Subjective: Patient resting comfortably this morning.  States she feels a bit improved.  Does admit to having a bowel movement and believes it may be dark but no frank blood.  Denies any fever, chills, chest pain, nausea, vomiting, abdominal pain.  A bit anxious about her bleeding and endoscopy results.  Still unavailable this morning.  Physical Exam:  Vitals:   12/26/23 0032 12/26/23 0433 12/26/23 0747 12/26/23 1221  BP: (!) 120/42 (!) 121/43 (!) 132/44 (!) 142/54  Pulse: 70 73 75 75  Resp: 17  19 19   Temp: 98.7 F (37.1 C) 97.9 F (36.6 C) 99 F (37.2 C) 98.6 F (37 C)  TempSrc: Oral Oral Oral Oral  SpO2: 99% 97% 100% 100%  Weight:      Height:        GENERAL:  Alert, pleasant, no acute distress  HEENT:  EOMI CARDIOVASCULAR:  RRR, no murmurs appreciated RESPIRATORY:  Clear to auscultation, no wheezing, rales, or rhonchi GASTROINTESTINAL:  Soft, nontender, nondistended EXTREMITIES: Mild LE edema bilaterally NEURO:  No new focal deficits appreciated SKIN:  No rashes noted PSYCH:  Appropriate mood and affect    Data Reviewed:  No new imaging to review  Previous records (including but not limited to H&P, progress notes, nursing notes, TOC management) were reviewed in assessment of this patient.  Labs: CBC: Recent Labs  Lab 12/23/23 1356 12/23/23 2025 12/24/23 1404 12/25/23 0849 12/26/23 0437  WBC 3.8* 3.9* 6.6 5.9 4.9  NEUTROABS 2.7 2.4  --   --   --  HGB 5.9 Repeated and verified X2.* 5.4* 9.0* 9.6* 8.9*  HCT 19.1 Repeated and verified X2.* 18.5* 29.9* 31.1* 29.7*  MCV 97.0 103.9* 91.4 92.8 94.3  PLT 114.0* 108* 128* 127* 125*   Basic Metabolic Panel: Recent Labs  Lab 12/23/23 1356 12/23/23 1900 12/24/23 1404 12/25/23 0849 12/26/23 0437  NA 136 138 140 139 141  K 4.0 4.2 3.9 3.4* 3.5  CL 105 107 108 107 110  CO2 25 20* 25 25 25   GLUCOSE 366* 439* 138* 122* 148*  BUN 15 13 10  7* 8  CREATININE 0.98 1.13* 1.06* 1.03* 1.02*  CALCIUM  9.0 8.9 9.1  8.4* 8.2*  MG  --   --   --  1.8 1.9   Liver Function Tests: Recent Labs  Lab 12/23/23 1356 12/24/23 1404  AST 17 24  ALT 15 18  ALKPHOS 108 82  BILITOT 0.7 4.6*  PROT 6.0 5.9*  ALBUMIN  3.6 3.1*   CBG: Recent Labs  Lab 12/25/23 2215 12/26/23 0005 12/26/23 0434 12/26/23 0747 12/26/23 1217  GLUCAP 138* 103* 153* 149* 242*    Scheduled Meds:  atorvastatin   80 mg Oral Daily   glipiZIDE   10 mg Oral BID WC   insulin  aspart  0-15 Units Subcutaneous Q4H   metoprolol  succinate  25 mg Oral Daily   multivitamin with minerals  1 tablet Oral Daily   pantoprazole  (PROTONIX ) IV  40 mg Intravenous Q12H   sodium chloride  flush  10-40 mL Intracatheter Q12H   Continuous Infusions: PRN Meds:.acetaminophen  **OR** acetaminophen , ipratropium-albuterol , nitroGLYCERIN , ondansetron  **OR** ondansetron  (ZOFRAN ) IV, senna-docusate, sodium chloride  flush  Family Communication: Daughter via telephone yesterday, none this morning  Disposition: Status is: Observation The patient remains OBS appropriate and will d/c before 2 midnights.     Time spent: 35 minutes  Length of inpatient stay: 0 days  Author: Jodeane Mulligan, DO 12/26/2023 1:13 PM  For on call review [email or web address].

## 2023-12-26 NOTE — Hospital Course (Signed)
 78 y.o. female with medical history significant for chronic diastolic HF, type 2 diabetes, HTN, HLD, CVA, CAD/NSTEMI on DAPT, NAFLD/cirrhosis, CKD stage 2, breast cancer and iron  deficiency anemia requiring transfusions who was sent to the ED by her PCP for low hemoglobin.    Assessment and Plan:   Acute on chronic blood loss anemia - Hemoglobin 8.4 on presentation.  8.02 weeks prior.  S/p 2 units PRBCs.  Hemoglobin stable this morning.  Will recheck hemoglobin later this evening and in AM.  Transfuse if less than 7.   Upper GI bleed - Known history of AVMs/angioectasias from EGD 4/15.  GI consulted and following closely.  DAPT on hold for now.  Status post capsule endoscopy this morning 5/30.  IV Protonix  twice daily IV on board.   CAD s/p PEA arrest and NSTEMI 09/2023 - DAPT on hold.  Cardiac cath performed 3/25 showing three-vessel CAD with medical management recommendation.  Not a candidate for CABG and PCI given underlying comorbidities.  Plavix  recommended x 1 year at the time.  Currently on hold given ongoing GI bleed.  Will need to have discussion with patient, family, GI, on risk/benefits of resuming DAPT.   Acute exacerbation of chronic HFpEF - Chest x-ray noting mild vascular congestion.  Responded well to IV Lasix .  Currently not hypoxic, respiratory distress likely related to anemia.  Continue to monitor urine output recheck BMP in AM.   Diabetes mellitus - Insulin  sliding scale on board.   Goals of care - Awaiting results of capsule endoscopy as well as ongoing bleed.  Unfortunately patient has known CAD with NSTEMI and recommendations for DAPT along with known angioectasias with significant GI bleeding.  Will need discussion with patient/family about goals going forward.

## 2023-12-27 DIAGNOSIS — D500 Iron deficiency anemia secondary to blood loss (chronic): Secondary | ICD-10-CM | POA: Diagnosis not present

## 2023-12-27 DIAGNOSIS — D649 Anemia, unspecified: Secondary | ICD-10-CM | POA: Diagnosis not present

## 2023-12-27 DIAGNOSIS — K31811 Angiodysplasia of stomach and duodenum with bleeding: Secondary | ICD-10-CM | POA: Diagnosis not present

## 2023-12-27 DIAGNOSIS — I5033 Acute on chronic diastolic (congestive) heart failure: Secondary | ICD-10-CM | POA: Diagnosis not present

## 2023-12-27 LAB — CBC
HCT: 29.2 % — ABNORMAL LOW (ref 36.0–46.0)
Hemoglobin: 9 g/dL — ABNORMAL LOW (ref 12.0–15.0)
MCH: 28.9 pg (ref 26.0–34.0)
MCHC: 30.8 g/dL (ref 30.0–36.0)
MCV: 93.9 fL (ref 80.0–100.0)
Platelets: 116 10*3/uL — ABNORMAL LOW (ref 150–400)
RBC: 3.11 MIL/uL — ABNORMAL LOW (ref 3.87–5.11)
RDW: 21.5 % — ABNORMAL HIGH (ref 11.5–15.5)
WBC: 4.8 10*3/uL (ref 4.0–10.5)
nRBC: 0 % (ref 0.0–0.2)

## 2023-12-27 LAB — BASIC METABOLIC PANEL WITH GFR
Anion gap: 6 (ref 5–15)
BUN: 10 mg/dL (ref 8–23)
CO2: 24 mmol/L (ref 22–32)
Calcium: 8.2 mg/dL — ABNORMAL LOW (ref 8.9–10.3)
Chloride: 108 mmol/L (ref 98–111)
Creatinine, Ser: 1.02 mg/dL — ABNORMAL HIGH (ref 0.44–1.00)
GFR, Estimated: 57 mL/min — ABNORMAL LOW (ref >60)
Glucose, Bld: 299 mg/dL — ABNORMAL HIGH (ref 70–99)
Potassium: 3.6 mmol/L (ref 3.5–5.1)
Sodium: 138 mmol/L (ref 135–145)

## 2023-12-27 LAB — GLUCOSE, CAPILLARY
Glucose-Capillary: 295 mg/dL — ABNORMAL HIGH (ref 70–99)
Glucose-Capillary: 293 mg/dL — ABNORMAL HIGH (ref 70–99)

## 2023-12-27 MED ORDER — FERROUS GLUCONATE 324 (38 FE) MG PO TABS: 324.0000 mg | ORAL_TABLET | ORAL | Status: DC

## 2023-12-27 MED ORDER — PANTOPRAZOLE SODIUM 40 MG PO TBEC: 40.0000 mg | DELAYED_RELEASE_TABLET | Freq: Two times a day (BID) | ORAL | Status: DC

## 2023-12-27 NOTE — Plan of Care (Signed)
  Problem: Clinical Measurements: Goal: Ability to maintain clinical measurements within normal limits will improve Outcome: Progressing   Problem: Coping: Goal: Level of anxiety will decrease Outcome: Progressing   Problem: Safety: Goal: Ability to remain free from injury will improve Outcome: Progressing   Problem: Fluid Volume: Goal: Ability to maintain a balanced intake and output will improve Outcome: Progressing   Problem: Metabolic: Goal: Ability to maintain appropriate glucose levels will improve Outcome: Progressing

## 2023-12-27 NOTE — Progress Notes (Signed)
DISCHARGE NOTE HOME [name] to be discharged Home per MD order. Discussed prescriptions and follow up appointments with the patient. Prescriptions given to patient; medication list explained in detail. Patient verbalized understanding.  Skin clean, dry and intact without evidence of skin break down, no evidence of skin tears noted. IV catheter discontinued intact. Site without signs and symptoms of complications. Dressing and pressure applied. Pt denies pain at the site currently. No complaints noted.  Patient free of lines, drains, and wounds.   An After Visit Summary (AVS) was printed and given to the patient. Patient escorted via wheelchair, and discharged home via private auto.  Berneta Levins, RN

## 2023-12-27 NOTE — Discharge Summary (Signed)
 Physician Discharge Summary   Patient: Anita Mccormick MRN: [id number] DOB: November 06, 1945  Admit date:     12/23/2023  Discharge date: 12/27/23  Discharge Physician: Jodeane Mulligan   PCP: Viola Greulich, MD   Recommendations at discharge:    Pt to be discharged home.   If you experience worsening fever, chills, chest pain, shortness of breath, or other concerning symptoms, please call your PCP or go to the emergency department immediately.  Discharge Diagnoses: Principal Problem:   Acute on chronic anemia Active Problems:   Acute on chronic diastolic HF (heart failure) (HCC)   Symptomatic anemia   Gastrointestinal hemorrhage   Anemia due to chronic blood loss   Gastric AVM   AVM (arteriovenous malformation) of small bowel, acquired  Resolved Problems:   * No resolved hospital problems. *   Hospital Course:  78 y.o. female with medical history significant for chronic diastolic HF, type 2 diabetes, HTN, HLD, CVA, CAD/NSTEMI on DAPT, NAFLD/cirrhosis, CKD stage 2, breast cancer and iron  deficiency anemia requiring transfusions who was sent to the ED by her PCP for low hemoglobin.    Assessment and Plan:   Acute on chronic blood loss anemia - Hemoglobin 5.4 on presentation.  S/p 2 units PRBCs.  Hemoglobin stable.  Hemoglobin 9.0 this morning.  Upper GI bleed - Known history of AVMs/angioectasias from EGD 4/15.  GI consulted and following closely.  Status post capsule endoscopy 5/30.  Results noting no active bleed appreciated however relatively fast transit times could not limit small findings such as AVMs.  Not amenable to endoscopic intervention.  Hemoglobin stable.  At this time can resume patient's aspirin , Plavix .  Continue p.o. iron  supplementation.  Will give referral for hematology in the outpatient setting to recheck iron  stores and be established for possible IV iron  supplementation.   CAD s/p PEA arrest and NSTEMI 09/2023 - DAPT on hold initially.  Cardiac cath performed  3/25 showing three-vessel CAD with medical management recommendation.  Not a candidate for CABG and PCI given underlying comorbidities.  Plavix  recommended x 1 year at the time.  Active GI bleed appears to be resolved.  Will restart aspirin  plus Plavix  as previously prescribed.  Did have discussion with patient, family, GI, on risk/benefits of resuming DAPT.   Acute exacerbation of chronic HFpEF - Chest x-ray noting mild vascular congestion.  Responded well to IV Lasix .  Currently not hypoxic, respiratory distress likely related to anemia.     Diabetes mellitus - Can resume home regiment.   Goals of care - Unfortunately patient has known CAD with NSTEMI and recommendations for DAPT along with known angioectasias with significant GI bleeding.  Had a long talk with daughter via telephone 5/30 about risks of being on vs off DAPT concerning her bleeding and CAD/NSTEMI respectively.  Current recommendations to continue aspirin  plus Plavix  given the threat of worsening heart disease, arrhythmia, NSTEMI/STEMI.   Consultants: Gastroenterology Procedures performed: Capsule endoscopy Disposition: Home Diet recommendation:  Discharge Diet Orders (From admission, onward)     Start     Ordered   12/27/23 0000  Diet - low sodium heart healthy        12/27/23 1210           Cardiac and Carb modified diet  DISCHARGE MEDICATION: Allergies as of 12/27/2023       Reactions   Bee Venom Swelling   Penicillins Hives   Ace Inhibitors Cough   ????   Chlorthalidone    REACTION: unspecified  Lactose Intolerance (gi) Diarrhea   Metformin     REACTION: gi side effects   Sulfamethoxazole    REACTION: questionable        Medication List     TAKE these medications    acetaminophen  325 MG tablet Commonly known as: TYLENOL  Take 2 tablets (650 mg total) by mouth every 6 (six) hours as needed for mild pain (pain score 1-3) (or Fever >/= 101). What changed: how much to take   albuterol  108 (90  Base) MCG/ACT inhaler Commonly known as: VENTOLIN  HFA Inhale 2 puffs into the lungs every 6 (six) hours as needed for wheezing or shortness of breath.   aspirin  EC 81 MG tablet Take 81 mg by mouth daily. Swallow whole.   atorvastatin  80 MG tablet Commonly known as: LIPITOR  Take 1 tablet (80 mg total) by mouth daily.   BREZTRI AEROSPHERE IN Inhale 2 puffs into the lungs 2 (two) times daily.   clopidogrel  75 MG tablet Commonly known as: Plavix  Take 1 tablet (75 mg total) by mouth daily.   ferrous gluconate  324 MG tablet Commonly known as: FERGON Take 1 tablet (324 mg total) by mouth every other day. What changed: when to take this   glipiZIDE  10 MG 24 hr tablet Commonly known as: GLUCOTROL  XL TAKE 1 TABLET TWICE DAILY   glucose 4 GM chewable tablet Chew 1 tablet by mouth as needed for low blood sugar.   metoprolol  succinate 25 MG 24 hr tablet Commonly known as: TOPROL -XL Take 1 tablet (25 mg total) by mouth daily.   multivitamin tablet Take 1 tablet by mouth daily.   nitroGLYCERIN  0.4 MG SL tablet Commonly known as: Nitrostat  Place 1 tablet (0.4 mg total) under the tongue every 5 (five) minutes as needed for chest pain.   pantoprazole  40 MG tablet Commonly known as: Protonix  Take 1 tablet (40 mg total) by mouth daily. What changed: when to take this   Trulicity  1.5 MG/0.5ML Soaj Generic drug: Dulaglutide  Inject 1.5 mg into the skin once a week.         Discharge Exam: Filed Weights   12/23/23 1805 12/25/23 0743  Weight: 66.5 kg 66.7 kg    GENERAL:  Alert, pleasant, no acute distress  HEENT:  EOMI CARDIOVASCULAR:  RRR, no murmurs appreciated RESPIRATORY:  Clear to auscultation, no wheezing, rales, or rhonchi GASTROINTESTINAL:  Soft, nontender, nondistended EXTREMITIES:  No LE edema bilaterally NEURO:  No new focal deficits appreciated SKIN:  No rashes noted PSYCH:  Appropriate mood and affect     Condition at discharge: improving  The results of  significant diagnostics from this hospitalization (including imaging, microbiology, ancillary and laboratory) are listed below for reference.   Imaging Studies: CT ANGIO GI BLEED Result Date: 12/23/2023 CLINICAL DATA:  Hemoglobin 5.9 with history of diverticular disease, fecal occult positive. EXAM: CTA ABDOMEN AND PELVIS WITHOUT AND WITH CONTRAST TECHNIQUE: Multidetector CT imaging of the abdomen and pelvis was performed using the standard protocol during bolus administration of intravenous contrast. Multiplanar reconstructed images and MIPs were obtained and reviewed to evaluate the vascular anatomy. RADIATION DOSE REDUCTION: This exam was performed according to the departmental dose-optimization program which includes automated exposure control, adjustment of the mA and/or kV according to patient size and/or use of iterative reconstruction technique. CONTRAST:  70mL OMNIPAQUE  IOHEXOL  350 MG/ML SOLN COMPARISON:  CT abdomen pelvis 09/18/2021 FINDINGS: VASCULAR Aorta: Calcified atherosclerosis. No hemodynamically significant stenosis. No aneurysm or dissection. Celiac: Calcified plaque at the origin. No hemodynamically significant stenosis, aneurysm, or  dissection. SMA: Calcified plaque at the origin without hemodynamically significant stenosis. No aneurysm or dissection. Renals: Single right and dual left renal arteries. Mild narrowing of the superior proximal left renal artery. No aneurysm or dissection. IMA: Patent. Inflow: Scattered calcified atherosclerotic plaque without hemodynamically significant stenosis. No aneurysm or dissection. Proximal Outflow: Atherosclerotic plaque without hemodynamically significant stenosis, aneurysm, or dissection. Veins: Patent portal veins and IVC.  Periesophageal varices. Review of the MIP images confirms the above findings. NON-VASCULAR Lower chest: Patchy ground-glass opacities in the lower lobes are favored infectious/inflammatory. 8 mm right lower lobe nodule on series  12/image 134. Hepatobiliary: Nodular patent contour. Nondistended thick-walled bladder likely due to hepatic cirrhosis. No biliary dilation or radiopaque stone. Pancreas: Unremarkable. Spleen: Unremarkable. Adrenals/Urinary Tract: Normal adrenal glands. No urinary calculi or hydronephrosis. Unremarkable bladder. Stomach/Bowel: Stomach is within normal limits. Normal caliber large and small bowel. Colonic diverticulosis without diverticulitis. No bowel wall thickening. Normal appendix. Lymphatic: No lymphadenopathy. Reproductive: Uterus and bilateral adnexa are unremarkable. Other: Trace free fluid in the pelvis. No free intraperitoneal air. Ventral hernia containing fat in the left paramedian region inferior to the umbilicus. This is similar to prior. Musculoskeletal: No acute fracture.  Body wall anasarca. IMPRESSION: 1. No evidence of GI bleed. 2. Colonic diverticulosis without diverticulitis. 3. Hepatic cirrhosis with sequela of portal hypertension including periesophageal varices. 4. Patchy ground-glass opacities in the lower lobes are favored infectious/inflammatory. 5. 8 mm right lower lobe nodule. Non-contrast chest CT at 6-12 months is recommended. If the nodule is stable at time of repeat CT, then future CT at 18-24 months (from today's scan) is considered optional for low-risk patients, but is recommended for high-risk patients. This recommendation follows the consensus statement: Guidelines for Management of Incidental Pulmonary Nodules Detected on CT Images: From the Fleischner Society 2017; Radiology 2017; 284:228-243. 6. Aortic Atherosclerosis (ICD10-I70.0). 7. Ventral hernia containing fat in the left paramedian region inferior to the umbilicus. This is similar to prior. Electronically Signed   By: Rozell Cornet M.D.   On: 12/23/2023 21:55   DG Chest 2 View Result Date: 12/23/2023 CLINICAL DATA:  Shortness of breath. EXAM: CHEST - 2 VIEW COMPARISON:  Chest radiograph dated 10/23/2023 FINDINGS:  Mild central vascular congestion. Indeterminate opacity over the right lung base may be superimposed on the patient. No pleural effusion pneumothorax. Stable cardiac silhouette no acute osseous pathology. IMPRESSION: 1. Mild central vascular congestion. 2. Indeterminate opacity over the right lung base may be superimposed on the patient. Recommend repeat radiograph with better positioning of the patient. Electronically Signed   By: Angus Bark M.D.   On: 12/23/2023 19:51    Microbiology: Results for orders placed or performed during the hospital encounter of 12/23/23  Resp panel by RT-PCR (RSV, Flu A&B, Covid) Anterior Nasal Swab     Status: None   Collection Time: 12/23/23  7:21 PM   Specimen: Anterior Nasal Swab  Result Value Ref Range Status   SARS Coronavirus 2 by RT PCR NEGATIVE NEGATIVE Final   Influenza A by PCR NEGATIVE NEGATIVE Final   Influenza B by PCR NEGATIVE NEGATIVE Final    Comment: (NOTE) The Xpert Xpress SARS-CoV-2/FLU/RSV plus assay is intended as an aid in the diagnosis of influenza from Nasopharyngeal swab specimens and should not be used as a sole basis for treatment. Nasal washings and aspirates are unacceptable for Xpert Xpress SARS-CoV-2/FLU/RSV testing.  Fact Sheet for Patients: BloggerCourse.com  Fact Sheet for Healthcare Providers: SeriousBroker.it  This test is not yet approved or cleared  by the [place]  FDA and has been authorized for detection and/or diagnosis of SARS-CoV-2 by FDA under an Emergency Use Authorization (EUA). This EUA will remain in effect (meaning this test can be used) for the duration of the COVID-19 declaration under Section 564(b)(1) of the Act, 21 U.S.C. section 360bbb-3(b)(1), unless the authorization is terminated or revoked.     Resp Syncytial Virus by PCR NEGATIVE NEGATIVE Final    Comment: (NOTE) Fact Sheet for  Patients: BloggerCourse.com  Fact Sheet for Healthcare Providers: SeriousBroker.it  This test is not yet approved or cleared by the [place]  FDA and has been authorized for detection and/or diagnosis of SARS-CoV-2 by FDA under an Emergency Use Authorization (EUA). This EUA will remain in effect (meaning this test can be used) for the duration of the COVID-19 declaration under Section 564(b)(1) of the Act, 21 U.S.C. section 360bbb-3(b)(1), unless the authorization is terminated or revoked.  Performed at [facility], [address] [place], [place] [address]   Surgical pcr screen     Status: None   Collection Time: 12/24/23 11:06 PM   Specimen: Nasal Mucosa; Nasal Swab  Result Value Ref Range Status   MRSA, PCR NEGATIVE NEGATIVE Final   Staphylococcus aureus NEGATIVE NEGATIVE Final    Comment: (NOTE) The Xpert SA Assay (FDA approved for NASAL specimens in patients 66 years of age and older), is one component of a comprehensive surveillance program. It is not intended to diagnose infection nor to guide or monitor treatment. Performed at [facility], [address], [place], [place] [address]     Labs: CBC: Recent Labs  Lab 12/23/23 1356 12/23/23 2025 12/24/23 1404 12/25/23 0849 12/26/23 0437 12/27/23 0511  WBC 3.8* 3.9* 6.6 5.9 4.9 4.8  NEUTROABS 2.7 2.4  --   --   --   --   HGB 5.9 Repeated and verified X2.* 5.4* 9.0* 9.6* 8.9* 9.0*  HCT 19.1 Repeated and verified X2.* 18.5* 29.9* 31.1* 29.7* 29.2*  MCV 97.0 103.9* 91.4 92.8 94.3 93.9  PLT 114.0* 108* 128* 127* 125* 116*   Basic Metabolic Panel: Recent Labs  Lab 12/23/23 1900 12/24/23 1404 12/25/23 0849 12/26/23 0437 12/27/23 0511  NA 138 140 139 141 138  K 4.2 3.9 3.4* 3.5 3.6  CL 107 108 107 110 108  CO2 20* 25 25 25 24   GLUCOSE 439* 138* 122* 148* 299*  BUN 13 10 7* 8 10  CREATININE 1.13* 1.06* 1.03* 1.02* 1.02*  CALCIUM  8.9 9.1 8.4*  8.2* 8.2*  MG  --   --  1.8 1.9  --    Liver Function Tests: Recent Labs  Lab 12/23/23 1356 12/24/23 1404  AST 17 24  ALT 15 18  ALKPHOS 108 82  BILITOT 0.7 4.6*  PROT 6.0 5.9*  ALBUMIN  3.6 3.1*   CBG: Recent Labs  Lab 12/26/23 0747 12/26/23 1217 12/26/23 1624 12/26/23 2012 12/27/23 0831  GLUCAP 149* 242* 189* 161* 295*    Discharge time spent: 34 minutes.  Length of inpatient stay: 0 days  Signed: Jodeane Mulligan, DO [facility] 12/27/2023

## 2023-12-28 ENCOUNTER — Telehealth: Payer: Self-pay

## 2023-12-28 ENCOUNTER — Other Ambulatory Visit (INDEPENDENT_AMBULATORY_CARE_PROVIDER_SITE_OTHER)

## 2023-12-28 DIAGNOSIS — E1129 Type 2 diabetes mellitus with other diabetic kidney complication: Secondary | ICD-10-CM

## 2023-12-28 NOTE — Progress Notes (Signed)
 12/28/2023 Name: Anita Mccormick MRN: [id number] DOB: 03-02-1946  Chief Complaint  Patient presents with   Medication Management   Diabetes    Anita Mccormick is a 78 y.o. year old female who presented for a telephone visit.   They were referred to the pharmacist by their PCP for assistance in managing medication access and complex medication management.    Subjective:  Care Team: Primary Care Provider: Viola Greulich, MD ; Next Scheduled Visit: 01/06/24  Medication Access/Adherence  Current Pharmacy:  [id number] [place], [place] - [address] AT [facility] [address] [place] [address] Phone: [phone] Fax: [phone]  [facility] - [place], [place] - [address] [place] [address] Phone: [phone] Fax: [phone]  [place] [facility] [address], [address] [place] [address] Phone: [phone] Fax: [phone]  [facility] - [place], [place] - [address] [place] [address] Phone: [phone] Fax: [phone]  [id number] - [address], [place] - [address] AT [address] [place] [address] Phone: [phone] Fax: [phone]   Patient reports affordability concerns with their medications: Yes - Dexcom Sensors Patient reports access/transportation concerns to their pharmacy: No  Patient reports adherence concerns with their medications:  Yes  - wants to make sure she is following all medication changes from recent hospital discharge  Diabetes:  Current medications: Trulicity  1.5mg  once weekly (has not restarted yet), Glipizide  Medications tried in the past: Januvia , Metformin   Current glucose readings: reports her dexcom receiver is acting up, will alert her if high or low but she is not seeing the actual numbers  Recent  labwork shows glucose has been running high  Observed patterns:  Patient denies hypoglycemic s/sx including dizziness, shakiness, sweating. Patient denies hyperglycemic symptoms including polyuria, polydipsia, polyphagia, nocturia, neuropathy, blurred vision.   Current medication access support: Trulicity  through [employer]   Objective:  Lab Results  Component Value Date   HGBA1C 6.1 (H) 12/24/2023    Lab Results  Component Value Date   CREATININE 1.02 (H) 12/27/2023   BUN 10 12/27/2023   NA 138 12/27/2023   K 3.6 12/27/2023   CL 108 12/27/2023   CO2 24 12/27/2023    Lab Results  Component Value Date   CHOL 103 12/07/2023   HDL 66 12/07/2023   LDLCALC 24 12/07/2023   LDLDIRECT 95.8 07/02/2006   TRIG 53 12/07/2023   CHOLHDL 1.6 12/07/2023    Medications Reviewed Today     Reviewed by Carnell Christian, RPH (Pharmacist) on 12/28/23 at 1149  Med List Status: <None>   Medication Order Taking? Sig Documenting Provider Last Dose Status Informant  acetaminophen  (TYLENOL ) 325 MG tablet [id number] No Take 2 tablets (650 mg total) by mouth every 6 (six) hours as needed for mild pain (pain score 1-3) (or Fever >/= 101).  Patient taking differently: Take 325 mg by mouth every 6 (six) hours as needed for mild pain (pain score 1-3) (or Fever >/= 101).   Maylene Spear, MD Past Month Active Self, Pharmacy Records  albuterol  (VENTOLIN  HFA) 108 [phone] Base) MCG/ACT inhaler [id number] No Inhale 2 puffs into the lungs every 6 (six) hours as needed for wheezing or shortness of breath. Anita, Taye T, MD Unknown Active Self, Pharmacy Records           Med  Note (LEE, NICOLE   Thu Dec 24, 2023 12:26 AM) Rescue inhaler  aspirin  EC 81 MG tablet [id number] No Take 81 mg by mouth daily. Swallow whole. [provider] 12/23/2023 Morning Active Self, Pharmacy Records  atorvastatin  (LIPITOR ) 80 MG tablet [id number] No Take 1 tablet (80 mg total) by mouth daily. Anita Greulich, MD 12/23/2023  Morning Active Self, Pharmacy Records  Budeson-Glycopyrrol-Formoterol  (BREZTRI AEROSPHERE IN) [id number] No Inhale 2 puffs into the lungs 2 (two) times daily. [provider] Unknown Active Self, Pharmacy Records           Med Note (LEE, NICOLE   Thu Dec 24, 2023 12:26 AM) New Rx; patient received in the mail and has not started.   clopidogrel  (PLAVIX ) 75 MG tablet [id number] No Take 1 tablet (75 mg total) by mouth daily. Anita Greulich, MD 12/23/2023 Morning Active Self, Pharmacy Records  Dulaglutide  (TRULICITY ) 1.5 MG/0.5ML SOAJ [id number] No Inject 1.5 mg into the skin once a week. [provider] Unknown Active Self, Pharmacy Records           Med Note Merlyn Starring, NICOLE   Thu Dec 24, 2023 12:29 AM) April 2025  ferrous gluconate  (FERGON) 324 MG tablet [id number]  Take 1 tablet (324 mg total) by mouth every other day. Anita Mulligan, DO  Active   glipiZIDE  (GLUCOTROL  XL) 10 MG 24 hr tablet [id number] No TAKE 1 TABLET TWICE DAILY Anita Greulich, MD 12/23/2023 Morning Active Self, Pharmacy Records  glucose 4 GM chewable tablet [id number] No Chew 1 tablet by mouth as needed for low blood sugar. [provider] Unknown Active Self, Pharmacy Records  metoprolol  succinate (TOPROL -XL) 25 MG 24 hr tablet [id number] No Take 1 tablet (25 mg total) by mouth daily. Anita Greulich, MD 12/23/2023 Morning Active Self, Pharmacy Records  Multiple Vitamin (MULTIVITAMIN) tablet [id number] No Take 1 tablet by mouth daily. [provider] 12/23/2023 Morning Active Self, Pharmacy Records  nitroGLYCERIN  (NITROSTAT ) 0.4 MG SL tablet [id number] No Place 1 tablet (0.4 mg total) under the tongue every 5 (five) minutes as needed for chest pain. Anita Greulich, MD 12/23/2023 Bedtime Active Self, Pharmacy Records  pantoprazole  (PROTONIX ) 40 MG tablet [id number] No Take 1 tablet (40 mg total) by mouth daily.  Patient taking differently: Take 40 mg by mouth 2 (two) times daily.   Anita Greulich, MD  12/23/2023 Morning Active Self, Pharmacy Records              Assessment/Plan:   Diabetes: - Currently uncontrolled - Reviewed long term cardiovascular and renal outcomes of uncontrolled blood sugar - Reviewed goal A1c, goal fasting, and goal 2 hour post prandial glucose - Reviewed dietary modifications including low carb diet - Recommend to restart Trulicity  as requested by PCP at last visit  - Patient denies personal or family history of multiple endocrine neoplasia type 2, medullary thyroid cancer; personal history of pancreatitis or gallbladder disease. - Recommend to check glucose daily -Recommend to bring in receiver to review concerns with reading output -Reviewed hospital discharge med list with patient    Follow Up Plan: 1 week  Carnell Christian, PharmD Clinical Pharmacist [phone]

## 2023-12-28 NOTE — Transitions of Care (Post Inpatient/ED Visit) (Signed)
 12/28/2023  Name: Anita Mccormick MRN: [id number] DOB: 08/01/1945  Today's TOC FU Call Status: Today's TOC FU Call Status:: Successful TOC FU Call Completed TOC FU Call Complete Date: 12/28/23 Patient's Name and Date of Birth confirmed.  Transition Care Management Follow-up Telephone Call Date of Discharge: 12/25/23 Discharge Facility: Other ([facility]) Name of Other ([facility]) Discharge Facility: [facility] Type of Discharge: Inpatient Admission Primary Inpatient Discharge Diagnosis:: dehydration How have you been since you were released from the hospital?: Better Any questions or concerns?: No  Items Reviewed: Did you receive and understand the discharge instructions provided?: Yes Medications obtained,verified, and reconciled?: Yes (Medications Reviewed) Any new allergies since your discharge?: No Dietary orders reviewed?: Yes Do you have support at home?: Yes People in Home [RPT]: spouse, child(ren), adult  Medications Reviewed Today: Medications Reviewed Today     Reviewed by Darrall Ellison, LPN (Licensed Practical Nurse) on 12/28/23 at 1429  Med List Status: <None>   Medication Order Taking? Sig Documenting Provider Last Dose Status Informant  acetaminophen  (TYLENOL ) 325 MG tablet [id number] No Take 2 tablets (650 mg total) by mouth every 6 (six) hours as needed for mild pain (pain score 1-3) (or Fever >/= 101).  Patient taking differently: Take 325 mg by mouth every 6 (six) hours as needed for mild pain (pain score 1-3) (or Fever >/= 101).   Maylene Spear, MD Past Month Active Self, Pharmacy Records  albuterol  (VENTOLIN  HFA) 108 [phone] Base) MCG/ACT inhaler [id number] No Inhale 2 puffs into the lungs every 6 (six) hours as needed for wheezing or shortness of breath. Gonfa, Taye T, MD Unknown Active Self, Pharmacy Records           Med Note Merlyn Starring, NICOLE   Thu Dec 24, 2023 12:26 AM) Rescue inhaler  aspirin  EC 81 MG tablet [id number] No Take 81 mg by mouth daily. Swallow  whole. [provider] 12/23/2023 Morning Active Self, Pharmacy Records  atorvastatin  (LIPITOR ) 80 MG tablet [id number] No Take 1 tablet (80 mg total) by mouth daily. Viola Greulich, MD 12/23/2023 Morning Active Self, Pharmacy Records  Budeson-Glycopyrrol-Formoterol  (BREZTRI AEROSPHERE IN) [id number] No Inhale 2 puffs into the lungs 2 (two) times daily. [provider] Unknown Active Self, Pharmacy Records           Med Note (LEE, NICOLE   Thu Dec 24, 2023 12:26 AM) New Rx; patient received in the mail and has not started.   clopidogrel  (PLAVIX ) 75 MG tablet [id number] No Take 1 tablet (75 mg total) by mouth daily. Viola Greulich, MD 12/23/2023 Morning Active Self, Pharmacy Records  Dulaglutide  (TRULICITY ) 1.5 MG/0.5ML SOAJ [id number] No Inject 1.5 mg into the skin once a week. [provider] Unknown Active Self, Pharmacy Records           Med Note Merlyn Starring, NICOLE   Thu Dec 24, 2023 12:29 AM) April 2025  ferrous gluconate  (FERGON) 324 MG tablet [id number]  Take 1 tablet (324 mg total) by mouth every other day. Jodeane Mulligan, DO  Active   glipiZIDE  (GLUCOTROL  XL) 10 MG 24 hr tablet [id number] No TAKE 1 TABLET TWICE DAILY Viola Greulich, MD 12/23/2023 Morning Active Self, Pharmacy Records  glucose 4 GM chewable tablet [id number] No Chew 1 tablet by mouth as needed for low blood sugar. [provider] Unknown Active Self, Pharmacy Records  metoprolol  succinate (TOPROL -XL) 25 MG 24 hr tablet [id number] No Take 1 tablet (25 mg total) by mouth daily. Viola Greulich, MD 12/23/2023 Morning Active  Self, Pharmacy Records  Multiple Vitamin (MULTIVITAMIN) tablet [id number] No Take 1 tablet by mouth daily. [provider] 12/23/2023 Morning Active Self, Pharmacy Records  nitroGLYCERIN  (NITROSTAT ) 0.4 MG SL tablet [id number] No Place 1 tablet (0.4 mg total) under the tongue every 5 (five) minutes as needed for chest pain. Viola Greulich, MD 12/23/2023 Bedtime Active Self,  Pharmacy Records  pantoprazole  (PROTONIX ) 40 MG tablet [id number] No Take 1 tablet (40 mg total) by mouth daily.  Patient taking differently: Take 40 mg by mouth 2 (two) times daily.   Viola Greulich, MD 12/23/2023 Morning Active Self, Pharmacy Records            Home Care and Equipment/Supplies: Were Home Health Services Ordered?: NA Any new equipment or medical supplies ordered?: NA  Functional Questionnaire: Do you need assistance with bathing/showering or dressing?: Yes Do you need assistance with meal preparation?: Yes Do you need assistance with eating?: No Do you have difficulty maintaining continence: No Do you need assistance with getting out of bed/getting out of a chair/moving?: No Do you have difficulty managing or taking your medications?: No  Follow up appointments reviewed: PCP Follow-up appointment confirmed?: Yes Date of PCP follow-up appointment?: 01/06/24 Follow-up Provider: [facility] Follow-up appointment confirmed?: NA Do you need transportation to your follow-up appointment?: No Do you understand care options if your condition(s) worsen?: Yes-patient verbalized understanding    SIGNATURE Darrall Ellison, LPN [facility] Nurse Health Advisor Direct Dial [phone]

## 2023-12-29 ENCOUNTER — Ambulatory Visit: Payer: Medicare HMO | Admitting: Podiatry

## 2023-12-29 ENCOUNTER — Encounter (HOSPITAL_COMMUNITY): Payer: Self-pay | Admitting: Gastroenterology

## 2023-12-29 DIAGNOSIS — L84 Corns and callosities: Secondary | ICD-10-CM

## 2023-12-29 DIAGNOSIS — M79674 Pain in right toe(s): Secondary | ICD-10-CM | POA: Diagnosis not present

## 2023-12-29 DIAGNOSIS — M2041 Other hammer toe(s) (acquired), right foot: Secondary | ICD-10-CM

## 2023-12-29 DIAGNOSIS — B351 Tinea unguium: Secondary | ICD-10-CM | POA: Diagnosis not present

## 2023-12-29 DIAGNOSIS — M2141 Flat foot [pes planus] (acquired), right foot: Secondary | ICD-10-CM | POA: Diagnosis not present

## 2023-12-29 DIAGNOSIS — E1149 Type 2 diabetes mellitus with other diabetic neurological complication: Secondary | ICD-10-CM | POA: Diagnosis not present

## 2023-12-29 DIAGNOSIS — E119 Type 2 diabetes mellitus without complications: Secondary | ICD-10-CM | POA: Diagnosis not present

## 2023-12-29 DIAGNOSIS — M79675 Pain in left toe(s): Secondary | ICD-10-CM | POA: Diagnosis not present

## 2023-12-29 DIAGNOSIS — M2042 Other hammer toe(s) (acquired), left foot: Secondary | ICD-10-CM

## 2023-12-29 DIAGNOSIS — M2142 Flat foot [pes planus] (acquired), left foot: Secondary | ICD-10-CM

## 2023-12-29 NOTE — Progress Notes (Signed)
 ANNUAL DIABETIC FOOT EXAM  Subjective: Anita Mccormick presents today for annual diabetic foot exam.  Chief Complaint  Patient presents with   Diabetes    "I need these feet to be taken care of.  I always have a callus by my baby toe and on this right heel."  Dr. Arliss Lam - 11/19/2023; A1c -5.9   Patient confirms h/o diabetes.  Patient denies any h/o foot wounds.  Patient has been diagnosed with neuropathy.  Viola Greulich, MD is patient's PCP.  Past Medical History:  Diagnosis Date   Arthritis    Cancer (HCC)    breast left   Cerebrovascular accident [facility])    October 29 2021- Left eye- blind   COLONIC POLYPS, HX OF 12/10/2006   Qualifier: Diagnosis of  By: Penney Bowling MD, Bruce     Diabetes mellitus type II, controlled (HCC) 12/10/2006   Poor control on Januvia  100mg  and glipizide  10mg  XL Lab Results  Component Value Date   HGBA1C 8.3* 11/02/2014        Eczema    Fatty liver disease, nonalcoholic 07/14/2014   Per Dr. Penney Bowling Lab Results  Component Value Date   ALT 31 11/02/2014   AST 57* 11/02/2014   ALKPHOS 104 11/02/2014   BILITOT 1.1 11/02/2014       GERD (gastroesophageal reflux disease)    History of blood transfusion    History of kidney stones 2023   11/28/21 small stone currently, taking flomax    Hypertension    Iron  deficiency anemia, unspecified 05/08/2023   Sepsis (HCC) 09/19/2017   Sepsis due to Escherichia coli (E. coli) (HCC)    Symptomatic anemia 06/06/2021   UTI (urinary tract infection) 09/19/2017   Vaginal discharge 04/01/2022   Patient Active Problem List   Diagnosis Date Noted   Anemia due to chronic blood loss 12/26/2023   Gastric AVM 12/26/2023   AVM (arteriovenous malformation) of small bowel, acquired 12/26/2023   Gastrointestinal hemorrhage 12/24/2023   Acute on chronic anemia 12/23/2023   Heme positive stool 11/08/2023   Non-ST elevation (NSTEMI) myocardial infarction (HCC) 10/21/2023   Malnutrition of moderate degree 10/16/2023   Acute  respiratory failure with hypoxia (HCC) 10/13/2023   Influenza A 10/13/2023   Cardiac arrest (HCC) 10/13/2023   Pneumothorax on left 10/13/2023   Central retinal artery occlusion of right eye 10/25/2021   Symptomatic anemia 06/06/2021   Atherosclerosis of aorta (HCC) 05/18/2020   Cirrhosis of liver without ascites (HCC) 05/18/2020   Internal hemorrhoids 06/22/2019   Anemia    Chronic kidney disease (CKD), stage II (mild) 09/19/2017   Leukopenia 09/19/2017   Acute on chronic diastolic HF (heart failure) (HCC) 09/19/2017   Statins contraindicated 06/12/2017   Hepatotoxicity due to statin drug 06/12/2017   History of adenomatous polyp of colon 03/18/2017   Viral URI with cough 01/17/2017   Thrombocytopenia (HCC) 12/19/2015   Insomnia 08/21/2015   Nicotine  use disorder 05/21/2015   Eczema 05/21/2015   Fatty liver disease, nonalcoholic 07/14/2014   Hyperlipidemia 11/03/2007   History of CVA (cerebrovascular accident) 10/20/2007   Type II diabetes mellitus with renal manifestations (HCC) 12/10/2006   Essential hypertension 12/10/2006   History of breast cancer 12/10/2006   Past Surgical History:  Procedure Laterality Date   ARTERY BIOPSY Left 11/29/2021   Procedure: LEFT BIOPSY TEMPORAL ARTERY;  Surgeon: Dannis Dy, MD;  Location[facility] OR;  Service: Vascular;  Laterality: Left;   BIOPSY OF SKIN SUBCUTANEOUS TISSUE AND/OR MUCOUS MEMBRANE  11/10/2023  Procedure: BIOPSY;  Surgeon: Daina Drum, MD;  Location: [facility] ENDOSCOPY;  Service: Gastroenterology;;   CATARACT EXTRACTION Bilateral    COLONOSCOPY N/A 11/10/2023   Procedure: COLONOSCOPY;  Surgeon: Daina Drum, MD;  Location: [facility] ENDOSCOPY;  Service: Gastroenterology;  Laterality: N/A;   ESOPHAGOGASTRODUODENOSCOPY N/A 11/10/2023   Procedure: EGD (ESOPHAGOGASTRODUODENOSCOPY);  Surgeon: Daina Drum, MD;  Location: [facility] ENDOSCOPY;  Service: Gastroenterology;  Laterality: N/A;   GIVENS CAPSULE STUDY N/A 12/25/2023   Procedure:  IMAGING PROCEDURE, GI TRACT, INTRALUMINAL, VIA CAPSULE;  Surgeon: Albertina Hugger, MD;  Location: [facility] ENDOSCOPY;  Service: Gastroenterology;  Laterality: N/A;   HOT HEMOSTASIS N/A 11/10/2023   Procedure: EGD, WITH ARGON PLASMA COAGULATION;  Surgeon: Daina Drum, MD;  Location: [facility] ENDOSCOPY;  Service: Gastroenterology;  Laterality: N/A;   LEFT HEART CATH AND CORONARY ANGIOGRAPHY N/A 10/20/2023   Procedure: LEFT HEART CATH AND CORONARY ANGIOGRAPHY;  Surgeon: [place]azil[place], Peter M, MD;  Location: [facility] INVASIVE CV LAB;  Service: Cardiovascular;  Laterality: N/A;   MASTECTOMY Left    POLYPECTOMY  11/10/2023   Procedure: POLYPECTOMY, INTESTINE;  Surgeon: Daina Drum, MD;  Location: [facility] ENDOSCOPY;  Service: Gastroenterology;;   TUBAL LIGATION     Current Outpatient Medications on File Prior to Visit  Medication Sig Dispense Refill   acetaminophen  (TYLENOL ) 325 MG tablet Take 2 tablets (650 mg total) by mouth every 6 (six) hours as needed for mild pain (pain score 1-3) (or Fever >/= 101). (Patient taking differently: Take 325 mg by mouth every 6 (six) hours as needed for mild pain (pain score 1-3) (or Fever >/= 101).)     albuterol  (VENTOLIN  HFA) 108 (90 Base) MCG/ACT inhaler Inhale 2 puffs into the lungs every 6 (six) hours as needed for wheezing or shortness of breath. 6.7 g 0   aspirin  EC 81 MG tablet Take 81 mg by mouth daily. Swallow whole.     atorvastatin  (LIPITOR ) 80 MG tablet Take 1 tablet (80 mg total) by mouth daily. 90 tablet 3   Budeson-Glycopyrrol-Formoterol  (BREZTRI AEROSPHERE IN) Inhale 2 puffs into the lungs 2 (two) times daily.     clopidogrel  (PLAVIX ) 75 MG tablet Take 1 tablet (75 mg total) by mouth daily. 90 tablet 2   Dulaglutide  (TRULICITY ) 1.5 MG/0.5ML SOAJ Inject 1.5 mg into the skin once a week.     ferrous gluconate  (FERGON) 324 MG tablet Take 1 tablet (324 mg total) by mouth every other day.     glipiZIDE  (GLUCOTROL  XL) 10 MG 24 hr tablet TAKE 1 TABLET TWICE DAILY 180 tablet 3    glucose 4 GM chewable tablet Chew 1 tablet by mouth as needed for low blood sugar.     metoprolol  succinate (TOPROL -XL) 25 MG 24 hr tablet Take 1 tablet (25 mg total) by mouth daily. 90 tablet 1   Multiple Vitamin (MULTIVITAMIN) tablet Take 1 tablet by mouth daily.     nitroGLYCERIN  (NITROSTAT ) 0.4 MG SL tablet Place 1 tablet (0.4 mg total) under the tongue every 5 (five) minutes as needed for chest pain. 10 tablet 5   pantoprazole  (PROTONIX ) 40 MG tablet Take 1 tablet (40 mg total) by mouth daily. (Patient taking differently: Take 40 mg by mouth 2 (two) times daily.) 90 tablet 3   No current facility-administered medications on file prior to visit.    Allergies  Allergen Reactions   Bee Venom Swelling   Penicillins Hives   Ace Inhibitors Cough    ????   Chlorthalidone  REACTION: unspecified   Lactose Intolerance (Gi) Diarrhea   Metformin      REACTION: gi side effects   Sulfamethoxazole     REACTION: questionable   Social History   Occupational History   Not on file  Tobacco Use   Smoking status: Former    Current packs/day: 0.20    Average packs/day: 0.2 packs/day for 10.0 years (2.0 ttl pk-yrs)    Types: Cigarettes   Smokeless tobacco: Never  Vaping Use   Vaping status: Never Used  Substance and Sexual Activity   Alcohol use: Not Currently   Drug use: No   Sexual activity: Not Currently   Family History  Problem Relation Age of Onset   Stroke Mother    Cancer Father        prostate   Diabetes Sister    Pancreatic cancer Sister    Immunization History  Administered Date(s) Administered   Fluad Quad(high Dose 65+) 07/05/2020, 07/03/2022   Fluzone Influenza virus vaccine,trivalent (IIV3), split virus 06/01/2007, 04/27/2008, 06/05/2009, 05/02/2010, 04/28/2011   Influenza Split 04/30/2012   Influenza Whole 06/01/2007, 04/27/2008, 06/05/2009, 05/02/2010, 04/28/2011   Influenza, High Dose Seasonal PF 03/14/2015, 04/04/2016, 04/30/2017, 04/30/2018, 05/04/2019    Influenza,inj,Quad PF,6+ Mos 03/23/2013   Influenza-Unspecified 04/11/2014, 05/04/2019   PFIZER Comirnaty(Gray Top)Covid-19 Tri-Sucrose Vaccine 03/11/2021   PFIZER(Purple Top)SARS-COV-2 Vaccination 09/25/2019, 10/25/2019, 05/12/2020, 03/11/2021   Pfizer(Comirnaty)Fall Seasonal Vaccine 12 years and older 07/03/2022   Pneumococcal Conjugate-13 06/30/2014   Pneumococcal Polysaccharide-23 03/21/2009, 08/21/2015   Tdap 09/22/2011   Zoster, Live 03/19/2011     Review of Systems: Negative except as noted in the HPI.   Objective: There were no vitals filed for this visit.  Anita Mccormick is a pleasant 77 y.o. female in NAD. AAO X 3.  Diabetic foot exam was performed with the following findings:   Vascular Examination: CFT <4 seconds b/l. DP/PT pulses faintly palpable b/l. Skin temperature gradient warm to warm b/l. No pain with calf compression. No ischemia or gangrene. No cyanosis or clubbing noted b/l.    Neurological Examination:Pt has subjective symptoms of neuropathy. Protective sensation decreased with 10 gram monofilament b/l. Vibratory sensation intact b/l.  Dermatological Examination: Pedal skin warm and supple b/l.   No open wounds. No interdigital macerations.  Toenails 1-5 b/l thick, discolored, elongated with subungual debris and pain on dorsal palpation.    Hyperkeratotic lesion(s) lateral nailfold bilateral 5th digits.  No erythema, no edema, no drainage, no fluctuance. Preulcerative lesion noted right heel. There is visible subdermal hemorrhage. There is no surrounding erythema, no edema, no drainage, no odor, no fluctuance.  Musculoskeletal Examination: Muscle strength 5/5 to all lower extremity muscle groups bilaterally. Adductovarus deformity L 5th toe and R 5th toe. Pes planus deformity noted bilateral LE.  Radiographs: None     Lab Results  Component Value Date   HGBA1C 6.1 (H) 12/24/2023   ADA Risk Categorization: High Risk  Patient has one or more of the  following: Loss of protective sensation Absent pedal pulses Severe Foot deformity History of foot ulcer  Assessment: 1. Pain due to onychomycosis of toenails of both feet   2. Corns and callosities   3. Acquired hammertoes of both feet   4. Pes planus of both feet   5. Type II diabetes mellitus with neurological manifestations (HCC)   6. Encounter for diabetic foot exam (HCC)      Plan: Orders Placed This Encounter  Procedures   For Home Use Only DME Diabetic Shoe    To  [facility]:  Dispense one pair extra depth shoes and 3 pair custom insoles.    FOR HOME USE ONLY DME DIABETIC SHOE  -Patient was evaluated today. All questions/concerns addressed on today's visit. -Rx for [employer] [facility] dispensed to patient for one pair diabetic shoes and 3 pair custom insoles. -Toenails 1-5 b/l were debrided in length and girth with sterile nail nippers and dremel without iatrogenic bleeding.  -Corn(s) bilateral 5th toes pared utilizing sharp debridement with sterile blade without complication or incident. Total number debrided=2. -Preulcerative lesion pared right heel utilizing sterile scalpel blade. Total number pared=1. -Patient/POA to call should there be question/concern in the interim. Return in about 3 months (around 03/30/2024).  Luella Sager, DPM      [facility] LOCATION: [address] [place] [address]                   Office [phone]   [facility] LOCATION: [address] [place], [place] [address] Office [phone]

## 2023-12-30 ENCOUNTER — Telehealth: Payer: Self-pay | Admitting: Podiatry

## 2023-12-30 DIAGNOSIS — I959 Hypotension, unspecified: Secondary | ICD-10-CM | POA: Diagnosis not present

## 2023-12-30 DIAGNOSIS — N182 Chronic kidney disease, stage 2 (mild): Secondary | ICD-10-CM | POA: Diagnosis not present

## 2023-12-30 DIAGNOSIS — E1122 Type 2 diabetes mellitus with diabetic chronic kidney disease: Secondary | ICD-10-CM | POA: Diagnosis not present

## 2023-12-30 DIAGNOSIS — I5032 Chronic diastolic (congestive) heart failure: Secondary | ICD-10-CM | POA: Diagnosis not present

## 2023-12-30 DIAGNOSIS — J449 Chronic obstructive pulmonary disease, unspecified: Secondary | ICD-10-CM | POA: Diagnosis not present

## 2023-12-30 DIAGNOSIS — D509 Iron deficiency anemia, unspecified: Secondary | ICD-10-CM | POA: Diagnosis not present

## 2023-12-30 DIAGNOSIS — I130 Hypertensive heart and chronic kidney disease with heart failure and stage 1 through stage 4 chronic kidney disease, or unspecified chronic kidney disease: Secondary | ICD-10-CM | POA: Diagnosis not present

## 2023-12-30 DIAGNOSIS — I214 Non-ST elevation (NSTEMI) myocardial infarction: Secondary | ICD-10-CM | POA: Diagnosis not present

## 2023-12-30 DIAGNOSIS — D539 Nutritional anemia, unspecified: Secondary | ICD-10-CM | POA: Diagnosis not present

## 2023-12-30 NOTE — Telephone Encounter (Signed)
 Did patient receive referral at appointment on 6/3 or is it being sent to [facility]?

## 2023-12-31 DIAGNOSIS — I130 Hypertensive heart and chronic kidney disease with heart failure and stage 1 through stage 4 chronic kidney disease, or unspecified chronic kidney disease: Secondary | ICD-10-CM | POA: Diagnosis not present

## 2023-12-31 DIAGNOSIS — D539 Nutritional anemia, unspecified: Secondary | ICD-10-CM | POA: Diagnosis not present

## 2023-12-31 DIAGNOSIS — I214 Non-ST elevation (NSTEMI) myocardial infarction: Secondary | ICD-10-CM | POA: Diagnosis not present

## 2023-12-31 DIAGNOSIS — N182 Chronic kidney disease, stage 2 (mild): Secondary | ICD-10-CM | POA: Diagnosis not present

## 2023-12-31 DIAGNOSIS — D509 Iron deficiency anemia, unspecified: Secondary | ICD-10-CM | POA: Diagnosis not present

## 2023-12-31 DIAGNOSIS — I959 Hypotension, unspecified: Secondary | ICD-10-CM | POA: Diagnosis not present

## 2023-12-31 DIAGNOSIS — E1122 Type 2 diabetes mellitus with diabetic chronic kidney disease: Secondary | ICD-10-CM | POA: Diagnosis not present

## 2023-12-31 DIAGNOSIS — J449 Chronic obstructive pulmonary disease, unspecified: Secondary | ICD-10-CM | POA: Diagnosis not present

## 2023-12-31 DIAGNOSIS — I5032 Chronic diastolic (congestive) heart failure: Secondary | ICD-10-CM | POA: Diagnosis not present

## 2024-01-04 DIAGNOSIS — I130 Hypertensive heart and chronic kidney disease with heart failure and stage 1 through stage 4 chronic kidney disease, or unspecified chronic kidney disease: Secondary | ICD-10-CM | POA: Diagnosis not present

## 2024-01-04 DIAGNOSIS — I5032 Chronic diastolic (congestive) heart failure: Secondary | ICD-10-CM | POA: Diagnosis not present

## 2024-01-04 DIAGNOSIS — I214 Non-ST elevation (NSTEMI) myocardial infarction: Secondary | ICD-10-CM | POA: Diagnosis not present

## 2024-01-04 DIAGNOSIS — I959 Hypotension, unspecified: Secondary | ICD-10-CM | POA: Diagnosis not present

## 2024-01-04 DIAGNOSIS — D539 Nutritional anemia, unspecified: Secondary | ICD-10-CM | POA: Diagnosis not present

## 2024-01-04 DIAGNOSIS — N182 Chronic kidney disease, stage 2 (mild): Secondary | ICD-10-CM | POA: Diagnosis not present

## 2024-01-04 DIAGNOSIS — E1122 Type 2 diabetes mellitus with diabetic chronic kidney disease: Secondary | ICD-10-CM | POA: Diagnosis not present

## 2024-01-04 DIAGNOSIS — D509 Iron deficiency anemia, unspecified: Secondary | ICD-10-CM | POA: Diagnosis not present

## 2024-01-04 DIAGNOSIS — J449 Chronic obstructive pulmonary disease, unspecified: Secondary | ICD-10-CM | POA: Diagnosis not present

## 2024-01-05 DIAGNOSIS — I5032 Chronic diastolic (congestive) heart failure: Secondary | ICD-10-CM | POA: Diagnosis not present

## 2024-01-05 DIAGNOSIS — J449 Chronic obstructive pulmonary disease, unspecified: Secondary | ICD-10-CM | POA: Diagnosis not present

## 2024-01-05 DIAGNOSIS — I214 Non-ST elevation (NSTEMI) myocardial infarction: Secondary | ICD-10-CM | POA: Diagnosis not present

## 2024-01-05 DIAGNOSIS — D509 Iron deficiency anemia, unspecified: Secondary | ICD-10-CM | POA: Diagnosis not present

## 2024-01-05 DIAGNOSIS — I130 Hypertensive heart and chronic kidney disease with heart failure and stage 1 through stage 4 chronic kidney disease, or unspecified chronic kidney disease: Secondary | ICD-10-CM | POA: Diagnosis not present

## 2024-01-05 DIAGNOSIS — I959 Hypotension, unspecified: Secondary | ICD-10-CM | POA: Diagnosis not present

## 2024-01-05 DIAGNOSIS — N182 Chronic kidney disease, stage 2 (mild): Secondary | ICD-10-CM | POA: Diagnosis not present

## 2024-01-05 DIAGNOSIS — E1122 Type 2 diabetes mellitus with diabetic chronic kidney disease: Secondary | ICD-10-CM | POA: Diagnosis not present

## 2024-01-05 DIAGNOSIS — D539 Nutritional anemia, unspecified: Secondary | ICD-10-CM | POA: Diagnosis not present

## 2024-01-06 ENCOUNTER — Encounter: Payer: Self-pay | Admitting: Family Medicine

## 2024-01-06 ENCOUNTER — Ambulatory Visit (INDEPENDENT_AMBULATORY_CARE_PROVIDER_SITE_OTHER)

## 2024-01-06 ENCOUNTER — Ambulatory Visit

## 2024-01-06 ENCOUNTER — Ambulatory Visit: Admitting: Family Medicine

## 2024-01-06 VITALS — BP 128/78 | HR 87 | Temp 98.7°F | Ht 62.0 in | Wt 146.6 lb

## 2024-01-06 DIAGNOSIS — D509 Iron deficiency anemia, unspecified: Secondary | ICD-10-CM | POA: Diagnosis not present

## 2024-01-06 DIAGNOSIS — Z09 Encounter for follow-up examination after completed treatment for conditions other than malignant neoplasm: Secondary | ICD-10-CM

## 2024-01-06 DIAGNOSIS — D696 Thrombocytopenia, unspecified: Secondary | ICD-10-CM

## 2024-01-06 DIAGNOSIS — I252 Old myocardial infarction: Secondary | ICD-10-CM | POA: Diagnosis not present

## 2024-01-06 DIAGNOSIS — I2510 Atherosclerotic heart disease of native coronary artery without angina pectoris: Secondary | ICD-10-CM | POA: Diagnosis not present

## 2024-01-06 DIAGNOSIS — E1129 Type 2 diabetes mellitus with other diabetic kidney complication: Secondary | ICD-10-CM

## 2024-01-06 DIAGNOSIS — K5520 Angiodysplasia of colon without hemorrhage: Secondary | ICD-10-CM

## 2024-01-06 DIAGNOSIS — E785 Hyperlipidemia, unspecified: Secondary | ICD-10-CM

## 2024-01-06 DIAGNOSIS — I5032 Chronic diastolic (congestive) heart failure: Secondary | ICD-10-CM | POA: Diagnosis not present

## 2024-01-06 NOTE — Progress Notes (Unsigned)
 Established Patient Office Visit   Subjective  Patient ID: Anita Mccormick, female    DOB: [birth year]  Age: 78 y.o. MRN: [id number]  Chief Complaint  Patient presents with   Medical Management of Chronic Issues    Hospital Follow- up,   Pt accompanied by her daughter.  HPI Pt is a 78 yo female seen for HFU.  Pt sent to ED due to hgb 5.4.  Hospitalized 5/28-12/27/23 for acute on chronic anemia due to GIB.  Patient given 2 units PRBCs.  Hemoglobin improved to 9.0 at time of discharge.  Patient with history of AVM/angioectasis noted on EGD 4/15.  GI consulted.  Capsule endoscopy 12/25/2023.  No active bleeding appreciated but relatively fast transit time could not limit small findings such as AVMs.  Endoscopic intervention was not amenable.  Plavix  and aspirin  resumed given history of CAD s/p PEA arrest and NSTEMI during hospitalization 09/2023.  Patient advised to continue iron  supplementation and follow-up with heme-onc outpatient.  Acute on chronic HFpEF.  Given IV Lasix  with good response.  Since discharge patient states she has been doing well, feeling better.  Denies dizziness, CP, LE edema.  Taking iron .  Blood sugars been elevated.  Patient was advised to restart Trulicity  but has yet to do so.  Patient Active Problem List   Diagnosis Date Noted   Anemia due to chronic blood loss 12/26/2023   Gastric AVM 12/26/2023   AVM (arteriovenous malformation) of small bowel, acquired 12/26/2023   Gastrointestinal hemorrhage 12/24/2023   Acute on chronic anemia 12/23/2023   Heme positive stool 11/08/2023   Non-ST elevation (NSTEMI) myocardial infarction (HCC) 10/21/2023   Malnutrition of moderate degree 10/16/2023   Acute respiratory failure with hypoxia (HCC) 10/13/2023   Influenza A 10/13/2023   Cardiac arrest (HCC) 10/13/2023   Pneumothorax on left 10/13/2023   Central retinal artery occlusion of right eye 10/25/2021   Symptomatic anemia 06/06/2021   Atherosclerosis of aorta (HCC) 05/18/2020    Cirrhosis of liver without ascites (HCC) 05/18/2020   Internal hemorrhoids 06/22/2019   Anemia    Chronic kidney disease (CKD), stage II (mild) 09/19/2017   Leukopenia 09/19/2017   Acute on chronic diastolic HF (heart failure) (HCC) 09/19/2017   Statins contraindicated 06/12/2017   Hepatotoxicity due to statin drug 06/12/2017   History of adenomatous polyp of colon 03/18/2017   Viral URI with cough 01/17/2017   Thrombocytopenia (HCC) 12/19/2015   Insomnia 08/21/2015   Nicotine  use disorder 05/21/2015   Eczema 05/21/2015   Fatty liver disease, nonalcoholic 07/14/2014   Hyperlipidemia 11/03/2007   History of CVA (cerebrovascular accident) 10/20/2007   Type II diabetes mellitus with renal manifestations (HCC) 12/10/2006   Essential hypertension 12/10/2006   History of breast cancer 12/10/2006   Past Medical History:  Diagnosis Date   Arthritis    Cancer [facility])    breast left   Cerebrovascular accident [facility])    October 29 2021- Left eye- blind   COLONIC POLYPS, HX OF 12/10/2006   Qualifier: Diagnosis of  By: Penney Bowling MD, Bruce     Diabetes mellitus type II, controlled (HCC) 12/10/2006   Poor control on Januvia  100mg  and glipizide  10mg  XL Lab Results  Component Value Date   HGBA1C 8.3* 11/02/2014        Eczema    Fatty liver disease, nonalcoholic 07/14/2014   Per Dr. Penney Bowling Lab Results  Component Value Date   ALT 31 11/02/2014   AST 57* 11/02/2014   ALKPHOS 104 11/02/2014   BILITOT  1.1 11/02/2014       GERD (gastroesophageal reflux disease)    History of blood transfusion    History of kidney stones 2023   11/28/21 small stone currently, taking flomax    Hypertension    Iron  deficiency anemia, unspecified 05/08/2023   Sepsis (HCC) 09/19/2017   Sepsis due to Escherichia coli (E. coli) (HCC)    Symptomatic anemia 06/06/2021   UTI (urinary tract infection) 09/19/2017   Vaginal discharge 04/01/2022   Past Surgical History:  Procedure Laterality Date   ARTERY BIOPSY Left 11/29/2021    Procedure: LEFT BIOPSY TEMPORAL ARTERY;  Surgeon: Dannis Dy, MD;  Location: [facility] OR;  Service: Vascular;  Laterality: Left;   BIOPSY OF SKIN SUBCUTANEOUS TISSUE AND/OR MUCOUS MEMBRANE  11/10/2023   Procedure: BIOPSY;  Surgeon: Daina Drum, MD;  Location: [facility] ENDOSCOPY;  Service: Gastroenterology;;   CATARACT EXTRACTION Bilateral    COLONOSCOPY N/A 11/10/2023   Procedure: COLONOSCOPY;  Surgeon: Daina Drum, MD;  Location: [facility] ENDOSCOPY;  Service: Gastroenterology;  Laterality: N/A;   ESOPHAGOGASTRODUODENOSCOPY N/A 11/10/2023   Procedure: EGD (ESOPHAGOGASTRODUODENOSCOPY);  Surgeon: Daina Drum, MD;  Location: [facility] ENDOSCOPY;  Service: Gastroenterology;  Laterality: N/A;   GIVENS CAPSULE STUDY N/A 12/25/2023   Procedure: IMAGING PROCEDURE, GI TRACT, INTRALUMINAL, VIA CAPSULE;  Surgeon: Albertina Hugger, MD;  Location: [facility] ENDOSCOPY;  Service: Gastroenterology;  Laterality: N/A;   HOT HEMOSTASIS N/A 11/10/2023   Procedure: EGD, WITH ARGON PLASMA COAGULATION;  Surgeon: Daina Drum, MD;  Location: [facility] ENDOSCOPY;  Service: Gastroenterology;  Laterality: N/A;   LEFT HEART CATH AND CORONARY ANGIOGRAPHY N/A 10/20/2023   Procedure: LEFT HEART CATH AND CORONARY ANGIOGRAPHY;  Surgeon: Swaziland, Peter M, MD;  Location: [facility] INVASIVE CV LAB;  Service: Cardiovascular;  Laterality: N/A;   MASTECTOMY Left    POLYPECTOMY  11/10/2023   Procedure: POLYPECTOMY, INTESTINE;  Surgeon: Daina Drum, MD;  Location: [facility] ENDOSCOPY;  Service: Gastroenterology;;   TUBAL LIGATION     Social History   Tobacco Use   Smoking status: Former    Current packs/day: 0.20    Average packs/day: 0.2 packs/day for 10.0 years (2.0 ttl pk-yrs)    Types: Cigarettes   Smokeless tobacco: Never  Vaping Use   Vaping status: Never Used  Substance Use Topics   Alcohol use: Not Currently   Drug use: No   Family History  Problem Relation Age of Onset   Stroke Mother    Cancer Father        prostate   Diabetes Sister     Pancreatic cancer Sister    Allergies  Allergen Reactions   Bee Venom Swelling   Penicillins Hives   Ace Inhibitors Cough    ????   Chlorthalidone     REACTION: unspecified   Lactose Intolerance (Gi) Diarrhea   Metformin      REACTION: gi side effects   Sulfamethoxazole     REACTION: questionable    ROS Negative unless stated above    Objective:      BP 128/78 (BP Location: Right Arm, Patient Position: Sitting, Cuff Size: Normal)   Pulse 87   Temp 98.7 F (37.1 C) (Oral)   Ht 5' 2 (1.575 m)   Wt 146 lb 9.6 oz (66.5 kg)   SpO2 98%   BMI 26.81 kg/m  BP Readings from Last 3 Encounters:  01/06/24 128/78  12/27/23 (!) 146/53  12/23/23 132/70   Wt Readings from Last 3 Encounters:  01/06/24 146 lb 9.6 oz (  66.5 kg)  12/25/23 147 lb (66.7 kg)  12/23/23 146 lb 9.6 oz (66.5 kg)      Physical Exam Constitutional:      General: She is not in acute distress.    Appearance: Normal appearance.  HENT:     Head: Normocephalic and atraumatic.     Nose: Nose normal.     Mouth/Throat:     Mouth: Mucous membranes are moist.   Cardiovascular:     Rate and Rhythm: Normal rate and regular rhythm.     Heart sounds: Normal heart sounds. No murmur heard.    No gallop.  Pulmonary:     Effort: Pulmonary effort is normal. No respiratory distress.     Breath sounds: Normal breath sounds. No wheezing, rhonchi or rales.   Skin:    General: Skin is warm and dry.   Neurological:     Mental Status: She is alert and oriented to person, place, and time.        11/18/2023    2:52 PM 07/06/2023    8:52 AM 05/22/2023   11:29 AM  Depression screen PHQ 2/9  Decreased Interest 2 0 0  Down, Depressed, Hopeless 2 0 0  PHQ - 2 Score 4 0 0  Altered sleeping 2 0   Tired, decreased energy 3 0   Change in appetite 1 0   Feeling bad or failure about yourself  1 0   Trouble concentrating 0 0   Moving slowly or fidgety/restless 3 0   Suicidal thoughts 0 0   PHQ-9 Score 14 0    Difficult doing work/chores Not difficult at all Not difficult at all       11/18/2023    2:53 PM 07/06/2023    8:52 AM 10/29/2022   11:46 AM  GAD 7 : Generalized Anxiety Score  Nervous, Anxious, on Edge 3 0 0  Control/stop worrying 0 0 0  Worry too much - different things 0 0 0  Trouble relaxing 1 0 0  Restless 0 0 0  Easily annoyed or irritable 2 0 0  Afraid - awful might happen 0 0 0  Total GAD 7 Score 6 0 0  Anxiety Difficulty Not difficult at all Not difficult at all Not difficult at all     No results found for any visits on 01/06/24.    Assessment & Plan:   Iron  deficiency anemia, unspecified iron  deficiency anemia type -     CBC with Differential/Platelet; Future -     Basic metabolic panel with GFR; Future -     Ambulatory referral to Gastroenterology -     Ambulatory referral to Hematology / Oncology  AVM (arteriovenous malformation) of small bowel, acquired -     Ambulatory referral to Gastroenterology -     Ambulatory referral to Hematology / Oncology  Type 2 diabetes mellitus with other diabetic kidney complication, without long-term current use of insulin  (HCC) -     CBC with Differential/Platelet; Future -     Basic metabolic panel with GFR; Future  Chronic diastolic CHF (congestive heart failure) (HCC) -     CBC with Differential/Platelet; Future -     Basic metabolic panel with GFR; Future  Hyperlipidemia, unspecified hyperlipidemia type -     Basic metabolic panel with GFR; Future  Thrombocytopenia (HCC) -     CBC with Differential/Platelet; Future -     Basic metabolic panel with GFR; Future -     Ambulatory referral to Hematology / Oncology  Coronary artery disease involving native coronary artery of native heart, unspecified whether angina present  History of non-ST elevation myocardial infarction (NSTEMI)   Hemoglobin A1c 6.1% on 12/24/2023.  Patient with elevated blood sugars at home.  Restart Trulicity .  Continue use of CGM.  Continue  glipizide  XL 10 mg daily.  Continue lifestyle modifications.  Iron  deficiency anemia due to history of AVMs/angio ectasis.  No active bleeding noted during hospitalization.  Hemoglobin improved to 9.0 at time of discharge.  Recheck CBC this visit.  Continue iron  supplements.  Further recommendations if needed based on lab results.  Schedule follow-up with heme-onc.  Follow-up with GI.  Euvolemic.  Continue current medications.  Monitor intake of sodium and fluids.  Continue follow-up with cardiology.  History of NSTEMI on 09/2023 and CAD.  Cardiac cath on 3/25 with three-vessel CAD.  Continue medical management as not a candidate for CABG and PCI given underlying comorbidities.  Continue DAPT with Plavix  and aspirin .  Remain on Plavix  x 1 year.  Discussed  r/b/a.  Continue Lipitor  80 mg daily.  Return in about 2 months (around 03/07/2024).   Viola Greulich, MD

## 2024-01-06 NOTE — Progress Notes (Signed)
   01/06/2024 Name: Anita Mccormick MRN: [id number] DOB: [birth year]  Chief Complaint  Patient presents with   Medication Management   Diabetes    Anita Mccormick is a 78 y.o. year old female who presented for a telephone visit.   They were referred to the pharmacist by their PCP for assistance in managing medication access and complex medication management.    Subjective:  Care Team: Primary Care Provider: Viola Greulich, MD ; Next Scheduled Visit: 01/06/24  Medication Access/Adherence  Current Pharmacy:  [id number] [place], [place] - [address] AT [facility] [address] [place] [address] Phone: [phone] Fax: [phone]  [facility] - [place], [place] - [address] [place] [address] Phone: [phone] Fax: [phone]  [place] [facility] [address], [address] [place] [address] Phone: [phone] Fax: [phone]  [facility] - [place], [place] - [address] [place] [address] Phone: [phone] Fax: [phone]  [id number] - [address], [place] - [address] AT [address] [place] [address] Phone: [phone] Fax: [phone]   Patient reports affordability concerns with their medications: Yes - Dexcom Sensors Patient reports access/transportation concerns to their pharmacy: No  Patient reports adherence concerns with their medications:  Yes  - still has not restarted trulicity  as instructed by PCP  Diabetes:  Current medications: Trulicity  1.5mg  once weekly (gave one dose on 12/28/23 but then skipped as she was unsure if still suppose to take it), Glipizide  Medications tried in the past: Januvia , Metformin   Current glucose readings: 218 during appt but had eaten right before coming in and sugars were trending down  Recent  labwork shows glucose has been running high  Observed patterns:  Patient denies hypoglycemic s/sx including dizziness, shakiness, sweating. Patient denies hyperglycemic symptoms including polyuria, polydipsia, polyphagia, nocturia, neuropathy, blurred vision.   Current medication access support: Trulicity  through [employer]   Objective:  Lab Results  Component Value Date   HGBA1C 6.1 (H) 12/24/2023    Lab Results  Component Value Date   CREATININE 1.02 (H) 12/27/2023   BUN 10 12/27/2023   NA 138 12/27/2023   K 3.6 12/27/2023   CL 108 12/27/2023   CO2 24 12/27/2023    Lab Results  Component Value Date   CHOL 103 12/07/2023   HDL 66 12/07/2023   LDLCALC 24 12/07/2023   LDLDIRECT 95.8 07/02/2006   TRIG 53 12/07/2023   CHOLHDL 1.6 12/07/2023    Medications Reviewed Today   Medications were not reviewed in this encounter       Assessment/Plan:   Diabetes: - Currently uncontrolled - Reviewed long term cardiovascular and renal outcomes of uncontrolled blood sugar - Reviewed goal A1c, goal fasting, and goal 2 hour post prandial glucose - Reviewed dietary modifications including low carb diet - Recommend to restart Trulicity  as requested by PCP at last visit  - Patient denies personal or family history of multiple endocrine neoplasia type 2, medullary thyroid cancer; personal history of pancreatitis or gallbladder disease. - Recommend to check glucose daily -Recommend to bring in receiver to review concerns with reading output    Follow Up Plan: 1 month  Anita Mccormick, PharmD Clinical Pharmacist [phone]

## 2024-01-07 ENCOUNTER — Ambulatory Visit: Payer: Self-pay | Admitting: Family Medicine

## 2024-01-07 ENCOUNTER — Ambulatory Visit: Admitting: Family Medicine

## 2024-01-07 DIAGNOSIS — E1122 Type 2 diabetes mellitus with diabetic chronic kidney disease: Secondary | ICD-10-CM | POA: Diagnosis not present

## 2024-01-07 DIAGNOSIS — J449 Chronic obstructive pulmonary disease, unspecified: Secondary | ICD-10-CM | POA: Diagnosis not present

## 2024-01-07 DIAGNOSIS — D539 Nutritional anemia, unspecified: Secondary | ICD-10-CM | POA: Diagnosis not present

## 2024-01-07 DIAGNOSIS — I959 Hypotension, unspecified: Secondary | ICD-10-CM | POA: Diagnosis not present

## 2024-01-07 DIAGNOSIS — D509 Iron deficiency anemia, unspecified: Secondary | ICD-10-CM | POA: Diagnosis not present

## 2024-01-07 DIAGNOSIS — I214 Non-ST elevation (NSTEMI) myocardial infarction: Secondary | ICD-10-CM | POA: Diagnosis not present

## 2024-01-07 DIAGNOSIS — N182 Chronic kidney disease, stage 2 (mild): Secondary | ICD-10-CM | POA: Diagnosis not present

## 2024-01-07 DIAGNOSIS — I5032 Chronic diastolic (congestive) heart failure: Secondary | ICD-10-CM | POA: Diagnosis not present

## 2024-01-07 DIAGNOSIS — I130 Hypertensive heart and chronic kidney disease with heart failure and stage 1 through stage 4 chronic kidney disease, or unspecified chronic kidney disease: Secondary | ICD-10-CM | POA: Diagnosis not present

## 2024-01-07 LAB — CBC WITH DIFFERENTIAL/PLATELET
Absolute Lymphocytes: 658 {cells}/uL — ABNORMAL LOW (ref 850–3900)
Absolute Monocytes: 571 {cells}/uL (ref 200–950)
Basophils Absolute: 41 {cells}/uL (ref 0–200)
Basophils Relative: 0.8 %
Eosinophils Absolute: 398 {cells}/uL (ref 15–500)
Eosinophils Relative: 7.8 %
HCT: 27 % — ABNORMAL LOW (ref 35.0–45.0)
Hemoglobin: 8.1 g/dL — ABNORMAL LOW (ref 11.7–15.5)
MCH: 28 pg (ref 27.0–33.0)
MCHC: 30 g/dL — ABNORMAL LOW (ref 32.0–36.0)
MCV: 93.4 fL (ref 80.0–100.0)
MPV: 12 fL (ref 7.5–12.5)
Monocytes Relative: 11.2 %
Neutro Abs: 3432 {cells}/uL (ref 1500–7800)
Neutrophils Relative %: 67.3 %
Platelets: 170 10*3/uL (ref 140–400)
RBC: 2.89 10*6/uL — ABNORMAL LOW (ref 3.80–5.10)
RDW: 17 % — ABNORMAL HIGH (ref 11.0–15.0)
Total Lymphocyte: 12.9 %
WBC: 5.1 10*3/uL (ref 3.8–10.8)

## 2024-01-07 LAB — BASIC METABOLIC PANEL WITH GFR
BUN: 12 mg/dL (ref 7–25)
CO2: 29 mmol/L (ref 20–32)
Calcium: 9.2 mg/dL (ref 8.6–10.4)
Chloride: 106 mmol/L (ref 98–110)
Creat: 1 mg/dL (ref 0.60–1.00)
Glucose, Bld: 142 mg/dL — ABNORMAL HIGH (ref 65–99)
Potassium: 4.2 mmol/L (ref 3.5–5.3)
Sodium: 141 mmol/L (ref 135–146)
eGFR: 58 mL/min/{1.73_m2} — ABNORMAL LOW (ref >=60)

## 2024-01-08 ENCOUNTER — Telehealth: Payer: Self-pay

## 2024-01-08 NOTE — Telephone Encounter (Signed)
 Copied from CRM [id number]. Topic: Referral - Question >> Jan 08, 2024 11:58 AM [name] wrote: Reason for CRM: Patient inquiring about getting a referral to check her iron  deficiency and scheduling an appointment wanting to speak to Nurse Jim Motts. [facility] has already received referral and requested for her to call to schedule appointment. Patient said the [address] location said they haven't received anything to check her blood levels for scheduling and inquiring if it's too late. Callback number is [phone] patient requesting to speak to [name] about actions moving forward.

## 2024-01-08 NOTE — Telephone Encounter (Signed)
 Called and spoke with patient patient has and appt on June 30 with oncology

## 2024-01-12 DIAGNOSIS — I214 Non-ST elevation (NSTEMI) myocardial infarction: Secondary | ICD-10-CM | POA: Diagnosis not present

## 2024-01-12 DIAGNOSIS — I959 Hypotension, unspecified: Secondary | ICD-10-CM | POA: Diagnosis not present

## 2024-01-12 DIAGNOSIS — N182 Chronic kidney disease, stage 2 (mild): Secondary | ICD-10-CM | POA: Diagnosis not present

## 2024-01-12 DIAGNOSIS — I130 Hypertensive heart and chronic kidney disease with heart failure and stage 1 through stage 4 chronic kidney disease, or unspecified chronic kidney disease: Secondary | ICD-10-CM | POA: Diagnosis not present

## 2024-01-12 DIAGNOSIS — D539 Nutritional anemia, unspecified: Secondary | ICD-10-CM | POA: Diagnosis not present

## 2024-01-12 DIAGNOSIS — J449 Chronic obstructive pulmonary disease, unspecified: Secondary | ICD-10-CM | POA: Diagnosis not present

## 2024-01-12 DIAGNOSIS — I5032 Chronic diastolic (congestive) heart failure: Secondary | ICD-10-CM | POA: Diagnosis not present

## 2024-01-12 DIAGNOSIS — D509 Iron deficiency anemia, unspecified: Secondary | ICD-10-CM | POA: Diagnosis not present

## 2024-01-12 DIAGNOSIS — E1122 Type 2 diabetes mellitus with diabetic chronic kidney disease: Secondary | ICD-10-CM | POA: Diagnosis not present

## 2024-01-13 DIAGNOSIS — D509 Iron deficiency anemia, unspecified: Secondary | ICD-10-CM | POA: Diagnosis not present

## 2024-01-13 DIAGNOSIS — D539 Nutritional anemia, unspecified: Secondary | ICD-10-CM | POA: Diagnosis not present

## 2024-01-13 DIAGNOSIS — I130 Hypertensive heart and chronic kidney disease with heart failure and stage 1 through stage 4 chronic kidney disease, or unspecified chronic kidney disease: Secondary | ICD-10-CM | POA: Diagnosis not present

## 2024-01-13 DIAGNOSIS — I5032 Chronic diastolic (congestive) heart failure: Secondary | ICD-10-CM | POA: Diagnosis not present

## 2024-01-13 DIAGNOSIS — I959 Hypotension, unspecified: Secondary | ICD-10-CM | POA: Diagnosis not present

## 2024-01-13 DIAGNOSIS — N182 Chronic kidney disease, stage 2 (mild): Secondary | ICD-10-CM | POA: Diagnosis not present

## 2024-01-13 DIAGNOSIS — J449 Chronic obstructive pulmonary disease, unspecified: Secondary | ICD-10-CM | POA: Diagnosis not present

## 2024-01-13 DIAGNOSIS — E1122 Type 2 diabetes mellitus with diabetic chronic kidney disease: Secondary | ICD-10-CM | POA: Diagnosis not present

## 2024-01-13 DIAGNOSIS — I214 Non-ST elevation (NSTEMI) myocardial infarction: Secondary | ICD-10-CM | POA: Diagnosis not present

## 2024-01-14 DIAGNOSIS — I214 Non-ST elevation (NSTEMI) myocardial infarction: Secondary | ICD-10-CM | POA: Diagnosis not present

## 2024-01-14 DIAGNOSIS — D539 Nutritional anemia, unspecified: Secondary | ICD-10-CM | POA: Diagnosis not present

## 2024-01-14 DIAGNOSIS — I959 Hypotension, unspecified: Secondary | ICD-10-CM | POA: Diagnosis not present

## 2024-01-14 DIAGNOSIS — I130 Hypertensive heart and chronic kidney disease with heart failure and stage 1 through stage 4 chronic kidney disease, or unspecified chronic kidney disease: Secondary | ICD-10-CM | POA: Diagnosis not present

## 2024-01-14 DIAGNOSIS — J449 Chronic obstructive pulmonary disease, unspecified: Secondary | ICD-10-CM | POA: Diagnosis not present

## 2024-01-14 DIAGNOSIS — I5032 Chronic diastolic (congestive) heart failure: Secondary | ICD-10-CM | POA: Diagnosis not present

## 2024-01-14 DIAGNOSIS — N182 Chronic kidney disease, stage 2 (mild): Secondary | ICD-10-CM | POA: Diagnosis not present

## 2024-01-14 DIAGNOSIS — D509 Iron deficiency anemia, unspecified: Secondary | ICD-10-CM | POA: Diagnosis not present

## 2024-01-14 DIAGNOSIS — E1122 Type 2 diabetes mellitus with diabetic chronic kidney disease: Secondary | ICD-10-CM | POA: Diagnosis not present

## 2024-01-19 DIAGNOSIS — I214 Non-ST elevation (NSTEMI) myocardial infarction: Secondary | ICD-10-CM | POA: Diagnosis not present

## 2024-01-19 DIAGNOSIS — J449 Chronic obstructive pulmonary disease, unspecified: Secondary | ICD-10-CM | POA: Diagnosis not present

## 2024-01-19 DIAGNOSIS — D539 Nutritional anemia, unspecified: Secondary | ICD-10-CM | POA: Diagnosis not present

## 2024-01-19 DIAGNOSIS — E1122 Type 2 diabetes mellitus with diabetic chronic kidney disease: Secondary | ICD-10-CM | POA: Diagnosis not present

## 2024-01-19 DIAGNOSIS — I959 Hypotension, unspecified: Secondary | ICD-10-CM | POA: Diagnosis not present

## 2024-01-19 DIAGNOSIS — D509 Iron deficiency anemia, unspecified: Secondary | ICD-10-CM | POA: Diagnosis not present

## 2024-01-19 DIAGNOSIS — I5032 Chronic diastolic (congestive) heart failure: Secondary | ICD-10-CM | POA: Diagnosis not present

## 2024-01-19 DIAGNOSIS — N182 Chronic kidney disease, stage 2 (mild): Secondary | ICD-10-CM | POA: Diagnosis not present

## 2024-01-19 DIAGNOSIS — I130 Hypertensive heart and chronic kidney disease with heart failure and stage 1 through stage 4 chronic kidney disease, or unspecified chronic kidney disease: Secondary | ICD-10-CM | POA: Diagnosis not present

## 2024-01-21 DIAGNOSIS — D539 Nutritional anemia, unspecified: Secondary | ICD-10-CM | POA: Diagnosis not present

## 2024-01-21 DIAGNOSIS — D509 Iron deficiency anemia, unspecified: Secondary | ICD-10-CM | POA: Diagnosis not present

## 2024-01-21 DIAGNOSIS — I5032 Chronic diastolic (congestive) heart failure: Secondary | ICD-10-CM | POA: Diagnosis not present

## 2024-01-21 DIAGNOSIS — I130 Hypertensive heart and chronic kidney disease with heart failure and stage 1 through stage 4 chronic kidney disease, or unspecified chronic kidney disease: Secondary | ICD-10-CM | POA: Diagnosis not present

## 2024-01-21 DIAGNOSIS — I959 Hypotension, unspecified: Secondary | ICD-10-CM | POA: Diagnosis not present

## 2024-01-21 DIAGNOSIS — J449 Chronic obstructive pulmonary disease, unspecified: Secondary | ICD-10-CM | POA: Diagnosis not present

## 2024-01-21 DIAGNOSIS — N182 Chronic kidney disease, stage 2 (mild): Secondary | ICD-10-CM | POA: Diagnosis not present

## 2024-01-21 DIAGNOSIS — I214 Non-ST elevation (NSTEMI) myocardial infarction: Secondary | ICD-10-CM | POA: Diagnosis not present

## 2024-01-21 DIAGNOSIS — E1122 Type 2 diabetes mellitus with diabetic chronic kidney disease: Secondary | ICD-10-CM | POA: Diagnosis not present

## 2024-01-25 ENCOUNTER — Inpatient Hospital Stay: Attending: Oncology | Admitting: Oncology

## 2024-01-25 ENCOUNTER — Inpatient Hospital Stay

## 2024-01-25 VITALS — BP 120/68 | HR 87 | Temp 97.8°F | Resp 18 | Ht 62.0 in | Wt 146.0 lb

## 2024-01-25 DIAGNOSIS — K922 Gastrointestinal hemorrhage, unspecified: Secondary | ICD-10-CM | POA: Diagnosis not present

## 2024-01-25 DIAGNOSIS — E119 Type 2 diabetes mellitus without complications: Secondary | ICD-10-CM | POA: Diagnosis not present

## 2024-01-25 DIAGNOSIS — D500 Iron deficiency anemia secondary to blood loss (chronic): Secondary | ICD-10-CM | POA: Diagnosis not present

## 2024-01-25 DIAGNOSIS — Z8673 Personal history of transient ischemic attack (TIA), and cerebral infarction without residual deficits: Secondary | ICD-10-CM | POA: Insufficient documentation

## 2024-01-25 DIAGNOSIS — I110 Hypertensive heart disease with heart failure: Secondary | ICD-10-CM | POA: Diagnosis not present

## 2024-01-25 DIAGNOSIS — I509 Heart failure, unspecified: Secondary | ICD-10-CM | POA: Insufficient documentation

## 2024-01-25 DIAGNOSIS — Z853 Personal history of malignant neoplasm of breast: Secondary | ICD-10-CM | POA: Diagnosis not present

## 2024-01-25 LAB — CMP (CANCER CENTER ONLY)
ALT: 19 U/L (ref 0–44)
AST: 28 U/L (ref 15–41)
Albumin: 4 g/dL (ref 3.5–5.0)
Alkaline Phosphatase: 120 U/L (ref 38–126)
Anion gap: 9 (ref 5–15)
BUN: 13 mg/dL (ref 8–23)
CO2: 26 mmol/L (ref 22–32)
Calcium: 9.7 mg/dL (ref 8.9–10.3)
Chloride: 109 mmol/L (ref 98–111)
Creatinine: 1.09 mg/dL — ABNORMAL HIGH (ref 0.44–1.00)
GFR, Estimated: 52 mL/min — ABNORMAL LOW (ref >60)
Glucose, Bld: 105 mg/dL — ABNORMAL HIGH (ref 70–99)
Potassium: 3.9 mmol/L (ref 3.5–5.1)
Sodium: 144 mmol/L (ref 135–145)
Total Bilirubin: 0.6 mg/dL (ref 0.0–1.2)
Total Protein: 6.9 g/dL (ref 6.5–8.1)

## 2024-01-25 LAB — CBC WITH DIFFERENTIAL (CANCER CENTER ONLY)
Abs Immature Granulocytes: 0.02 10*3/uL (ref 0.00–0.07)
Basophils Absolute: 0 10*3/uL (ref 0.0–0.1)
Basophils Relative: 0 %
Eosinophils Absolute: 0.6 10*3/uL — ABNORMAL HIGH (ref 0.0–0.5)
Eosinophils Relative: 11 %
HCT: 26.8 % — ABNORMAL LOW (ref 36.0–46.0)
Hemoglobin: 7.7 g/dL — ABNORMAL LOW (ref 12.0–15.0)
Immature Granulocytes: 0 %
Lymphocytes Relative: 13 %
Lymphs Abs: 0.6 10*3/uL — ABNORMAL LOW (ref 0.7–4.0)
MCH: 27.9 pg (ref 26.0–34.0)
MCHC: 28.7 g/dL — ABNORMAL LOW (ref 30.0–36.0)
MCV: 97.1 fL (ref 80.0–100.0)
Monocytes Absolute: 0.6 10*3/uL (ref 0.1–1.0)
Monocytes Relative: 11 %
Neutro Abs: 3.3 10*3/uL (ref 1.7–7.7)
Neutrophils Relative %: 65 %
Platelet Count: 149 10*3/uL — ABNORMAL LOW (ref 150–400)
RBC: 2.76 MIL/uL — ABNORMAL LOW (ref 3.87–5.11)
RDW: 16.7 % — ABNORMAL HIGH (ref 11.5–15.5)
WBC Count: 5.1 10*3/uL (ref 4.0–10.5)
nRBC: 0 % (ref 0.0–0.2)

## 2024-01-25 LAB — IRON AND TIBC
Iron: 23 ug/dL — ABNORMAL LOW (ref 28–170)
Saturation Ratios: 5 % — ABNORMAL LOW (ref 10.4–31.8)
TIBC: 469 ug/dL — ABNORMAL HIGH (ref 250–450)
UIBC: 446 ug/dL

## 2024-01-25 LAB — FERRITIN: Ferritin: 26 ng/mL (ref 11–307)

## 2024-01-25 LAB — VITAMIN B12: Vitamin B-12: 527 pg/mL (ref 180–914)

## 2024-01-25 LAB — FOLATE: Folate: 31.6 ng/mL (ref >5.9)

## 2024-01-25 LAB — LACTATE DEHYDROGENASE: LDH: 217 U/L — ABNORMAL HIGH (ref 98–192)

## 2024-01-25 NOTE — Progress Notes (Unsigned)
 [facility]  HEMATOLOGY CLINIC CONSULTATION NOTE   PATIENT NAME: Anita Mccormick   MR#: [id number] DOB: 1945-09-27  DATE OF SERVICE: 01/25/2024  Patient Care Team: Mercer Clotilda SAUNDERS, MD as PCP - General (Family Medicine) [place], Peter M, MD as PCP - Cardiology (Cardiology) Octavia Bruckner, MD as Consulting Physician (Ophthalmology) Lionell Jon DEL, [facility] (Pharmacist)  REASON FOR CONSULTATION/ CHIEF COMPLAINT:  Evaluation of anemia.  ASSESSMENT & PLAN:   Anita Mccormick is a 78 y.o. lady with a past medical history of acute on chronic anemia due to gastrointestinal bleeding, history of gastric and small bowel AVMs/telangiectasias, CAD s/p PEA arrest and NSTEMI during hospitalization in 09/2023, congestive heart failure, history of CVA, diabetes mellitus, hypertension, Hx of left breast cancer in early 2000s, s/p mastectomy and axillary LN dissection, did not receive adjuvant chemo, was referred to our service for evaluation of iron  deficiency anemia.    No problem-specific Assessment & Plan notes found for this encounter.   Assessment and Plan Assessment & Plan       Since the cause of anemia seems to be obvious from iron  deficiency, I am not pursuing extensive workup at this time.  If inadequate response to iron  replacement is noted, we will pursue workup to rule out other etiologies.  I reviewed lab results and outside records for this visit and discussed relevant results with the patient. Diagnosis, plan of care and treatment options were also discussed in detail with the patient. Opportunity provided to ask questions and answers provided to her apparent satisfaction. Provided instructions to call our clinic with any problems, questions or concerns prior to return visit. I recommended to continue follow-up with PCP and sub-specialists. She verbalized understanding and agreed with the plan. No barriers to learning was detected.  Wells Gerdeman, MD [place]  [facility] - A DEPT OF [name] [id number] [place] [address] Dept: [phone] Dept Fax: [phone]  01/25/2024 12:26 PM  HISTORY OF PRESENT ILLNESS:  Discussed the use of AI scribe software for clinical note transcription with the patient, who gave verbal consent to proceed.  History of Present Illness    Hospitalized 5/28-12/27/23 for acute on chronic anemia due to GIB.  Patient given 2 units PRBCs.  Hemoglobin improved to 9.0 at time of discharge.  Patient with history of AVM/angioectasis noted on EGD 4/15.  GI consulted.  Capsule endoscopy 12/25/2023.  No active bleeding appreciated but relatively fast transit time could not limit small findings such as AVMs.  Endoscopic intervention was not amenable.  She was advised to continue iron  supplementation and referral was sent to us  for further evaluation and management of iron  deficiency anemia.  ***She denies recent chest pain on exertion, shortness of breath on minimal exertion, pre-syncopal episodes, or palpitations.  ***She had not noticed any recent bleeding such as epistaxis, hematuria or hematochezia  ***The patient denies over the counter NSAID ingestion. She is not *** on antiplatelets agents.  ***She had no prior history or diagnosis of cancer. Her age appropriate screening programs are up-to-date.  ***She denies any pica and eats a variety of diet.  ***The patient was prescribed oral iron  supplements and she takes ***  MEDICAL HISTORY:  Past Medical History:  Diagnosis Date   Arthritis    Cancer [facility])    breast left   Cerebrovascular accident [facility])    October 29 2021- Left eye- blind   COLONIC POLYPS, HX OF 12/10/2006  Qualifier: Diagnosis of  By: Tammie MD, Bruce     Diabetes mellitus type II, controlled (HCC) 12/10/2006   Poor control on Januvia  100mg  and glipizide  10mg  XL Lab Results  Component Value Date   HGBA1C 8.3* 11/02/2014        Eczema     Fatty liver disease, nonalcoholic 07/14/2014   Per Dr. Tammie Lab Results  Component Value Date   ALT 31 11/02/2014   AST 57* 11/02/2014   ALKPHOS 104 11/02/2014   BILITOT 1.1 11/02/2014       GERD (gastroesophageal reflux disease)    History of blood transfusion    History of kidney stones 2023   11/28/21 small stone currently, taking flomax    Hypertension    Iron  deficiency anemia, unspecified 05/08/2023   Sepsis (HCC) 09/19/2017   Sepsis due to Escherichia coli (E. coli) (HCC)    Symptomatic anemia 06/06/2021   UTI (urinary tract infection) 09/19/2017   Vaginal discharge 04/01/2022    SURGICAL HISTORY: Past Surgical History:  Procedure Laterality Date   ARTERY BIOPSY Left 11/29/2021   Procedure: LEFT BIOPSY TEMPORAL ARTERY;  Surgeon: Eliza Lonni RAMAN, MD;  Location: [facility] OR;  Service: Vascular;  Laterality: Left;   BIOPSY OF SKIN SUBCUTANEOUS TISSUE AND/OR MUCOUS MEMBRANE  11/10/2023   Procedure: BIOPSY;  Surgeon: Federico Rosario BROCKS, MD;  Location: [facility] ENDOSCOPY;  Service: Gastroenterology;;   CATARACT EXTRACTION Bilateral    COLONOSCOPY N/A 11/10/2023   Procedure: COLONOSCOPY;  Surgeon: Federico Rosario BROCKS, MD;  Location: [facility] ENDOSCOPY;  Service: Gastroenterology;  Laterality: N/A;   ESOPHAGOGASTRODUODENOSCOPY N/A 11/10/2023   Procedure: EGD (ESOPHAGOGASTRODUODENOSCOPY);  Surgeon: Federico Rosario BROCKS, MD;  Location: [facility] ENDOSCOPY;  Service: Gastroenterology;  Laterality: N/A;   GIVENS CAPSULE STUDY N/A 12/25/2023   Procedure: IMAGING PROCEDURE, GI TRACT, INTRALUMINAL, VIA CAPSULE;  Surgeon: Legrand Victory LITTIE DOUGLAS, MD;  Location: [facility] ENDOSCOPY;  Service: Gastroenterology;  Laterality: N/A;   HOT HEMOSTASIS N/A 11/10/2023   Procedure: EGD, WITH ARGON PLASMA COAGULATION;  Surgeon: Federico Rosario BROCKS, MD;  Location: [facility] ENDOSCOPY;  Service: Gastroenterology;  Laterality: N/A;   LEFT HEART CATH AND CORONARY ANGIOGRAPHY N/A 10/20/2023   Procedure: LEFT HEART CATH AND CORONARY ANGIOGRAPHY;  Surgeon: [place]azil[place], Peter M,  MD;  Location: [facility] INVASIVE CV LAB;  Service: Cardiovascular;  Laterality: N/A;   MASTECTOMY Left    POLYPECTOMY  11/10/2023   Procedure: POLYPECTOMY, INTESTINE;  Surgeon: Federico Rosario BROCKS, MD;  Location: [facility] ENDOSCOPY;  Service: Gastroenterology;;   TUBAL LIGATION      SOCIAL HISTORY: She reports that she has quit smoking. Her smoking use included cigarettes. She has a 2 pack-year smoking history. She has never used smokeless tobacco. She reports that she does not currently use alcohol. She reports that she does not use drugs. Social History   Socioeconomic History   Marital status: Married    Spouse name: Not on file   Number of children: Not on file   Years of education: Not on file   Highest education level: Not on file  Occupational History   Not on file  Tobacco Use   Smoking status: Former    Current packs/day: 0.20    Average packs/day: 0.2 packs/day for 10.0 years (2.0 ttl pk-yrs)    Types: Cigarettes   Smokeless tobacco: Never  Vaping Use   Vaping status: Never Used  Substance and Sexual Activity   Alcohol use: Not Currently   Drug use: No   Sexual activity: Not Currently  Other Topics Concern  Not on file  Social History Narrative   Married (husband Miquel). 4 children. 11 grandkids. 1 greatgrandchild.       Still working as homemaker      Hobbies: playing cards, board games, cooking, Print production planner   Social Drivers of Health   Financial Resource Strain: Low Risk  (10/03/2022)   Overall Financial Resource Strain (CARDIA)    Difficulty of Paying Living Expenses: Not hard at all  Food Insecurity: No Food Insecurity (01/25/2024)   Hunger Vital Sign    Worried About Running Out of Food in the Last Year: Never true    Ran Out of Food in the Last Year: Never true  Transportation Needs: No Transportation Needs (01/25/2024)   PRAPARE - Administrator, Civil Service (Medical): No    Lack of Transportation (Non-Medical): No  Physical Activity: Inactive (10/03/2022)    Exercise Vital Sign    Days of Exercise per Week: 0 days    Minutes of Exercise per Session: 0 min  Stress: No Stress Concern Present (10/03/2022)   [employer] of Occupational Health - Occupational Stress Questionnaire    Feeling of Stress : Not at all  Social Connections: Moderately Isolated (12/24/2023)   Social Connection and Isolation Panel    Frequency of Communication with Friends and Family: Three times a week    Frequency of Social Gatherings with Friends and Family: Three times a week    Attends Religious Services: Never    Active Member of Clubs or Organizations: No    Attends Banker Meetings: Never    Marital Status: Married  Catering manager Violence: Not At Risk (01/25/2024)   Humiliation, Afraid, Rape, and Kick questionnaire    Fear of Current or Ex-Partner: No    Emotionally Abused: No    Physically Abused: No    Sexually Abused: No    FAMILY HISTORY: Family History  Problem Relation Age of Onset   Stroke Mother    Cancer Father        prostate   Diabetes Sister    Pancreatic cancer Sister     ALLERGIES:  She is allergic to bee venom, penicillins, ace inhibitors, chlorthalidone, lactose intolerance (gi), metformin , and sulfamethoxazole.  MEDICATIONS:  Current Outpatient Medications  Medication Sig Dispense Refill   ACCU-CHEK GUIDE TEST test strip 1 each by Other route 3 (three) times daily.     acetaminophen  (TYLENOL ) 325 MG tablet Take 2 tablets (650 mg total) by mouth every 6 (six) hours as needed for mild pain (pain score 1-3) (or Fever >/= 101). (Patient taking differently: Take 325 mg by mouth every 6 (six) hours as needed for mild pain (pain score 1-3) (or Fever >/= 101).)     albuterol  (VENTOLIN  HFA) 108 (90 Base) MCG/ACT inhaler Inhale 2 puffs into the lungs every 6 (six) hours as needed for wheezing or shortness of breath. 6.7 g 0   aspirin  EC 81 MG tablet Take 81 mg by mouth daily. Swallow whole.     atorvastatin  (LIPITOR ) 80 MG  tablet Take 1 tablet (80 mg total) by mouth daily. 90 tablet 3   Blood Glucose Monitoring Suppl (ACCU-CHEK GUIDE ME) w/Device KIT Inject 1 Units into the skin 3 (three) times daily.     Budeson-Glycopyrrol-Formoterol  (BREZTRI AEROSPHERE IN) Inhale 2 puffs into the lungs 2 (two) times daily.     clopidogrel  (PLAVIX ) 75 MG tablet Take 1 tablet (75 mg total) by mouth daily. 90 tablet 2   Dulaglutide  (TRULICITY ) 1.5 MG/0.5ML  SOAJ Inject 1.5 mg into the skin once a week.     ferrous gluconate  (FERGON) 324 MG tablet Take 1 tablet (324 mg total) by mouth every other day.     glipiZIDE  (GLUCOTROL  XL) 10 MG 24 hr tablet TAKE 1 TABLET TWICE DAILY 180 tablet 3   glucose 4 GM chewable tablet Chew 1 tablet by mouth as needed for low blood sugar.     metoprolol  succinate (TOPROL -XL) 25 MG 24 hr tablet Take 1 tablet (25 mg total) by mouth daily. 90 tablet 1   Multiple Vitamin (MULTIVITAMIN) tablet Take 1 tablet by mouth daily.     nitroGLYCERIN  (NITROSTAT ) 0.4 MG SL tablet Place 1 tablet (0.4 mg total) under the tongue every 5 (five) minutes as needed for chest pain. 10 tablet 5   pantoprazole  (PROTONIX ) 40 MG tablet Take 1 tablet (40 mg total) by mouth daily. (Patient taking differently: Take 40 mg by mouth 2 (two) times daily.) 90 tablet 3   No current facility-administered medications for this visit.    REVIEW OF SYSTEMS:    Review of Systems - Oncology  All other pertinent systems were reviewed and were negative except as mentioned above.  PHYSICAL EXAMINATION:    Onc Performance Status - 01/25/24 1210       ECOG Perf Status   ECOG Perf Status Ambulatory and capable of all selfcare but unable to carry out any work activities.  Up and about more than 50% of waking hours      KPS SCALE   KPS % SCORE Cares for self, unable to carry on normal activity or to do active work          Vitals:   01/25/24 1202  BP: 120/68  Pulse: 87  Resp: 18  Temp: 97.8 F (36.6 C)  SpO2: 100%   Filed  Weights   01/25/24 1202  Weight: 146 lb (66.2 kg)    Physical Exam Constitutional:      General: She is not in acute distress.    Appearance: Normal appearance.  HENT:     Head: Normocephalic and atraumatic.   Cardiovascular:     Rate and Rhythm: Normal rate.  Pulmonary:     Effort: Pulmonary effort is normal. No respiratory distress.  Abdominal:     General: There is no distension.   Neurological:     General: No focal deficit present.     Mental Status: She is alert and oriented to person, place, and time.   Psychiatric:        Mood and Affect: Mood normal.        Behavior: Behavior normal.      LABORATORY DATA:   I have reviewed the data as listed.  No results found for any visits on 01/25/24.  RADIOGRAPHIC STUDIES:  No recent pertinent imaging studies available to review.  Orders Placed This Encounter  Procedures   CBC with Differential (Cancer Center Only)    Standing Status:   Future    Expiration Date:   01/24/2025   CMP (Cancer Center only)    Standing Status:   Future    Expiration Date:   01/24/2025   Iron  and TIBC    Standing Status:   Future    Expiration Date:   01/24/2025   Ferritin    Standing Status:   Future    Expiration Date:   01/24/2025   Vitamin B12    Standing Status:   Future    Expiration Date:  01/24/2025   Folate    Standing Status:   Future    Expiration Date:   01/24/2025   Lactate dehydrogenase    Standing Status:   Future    Expiration Date:   01/24/2025   Haptoglobin    Standing Status:   Future    Expiration Date:   01/24/2025    Future Appointments  Date Time Provider Department Center  02/15/2024  1:30 PM Mercer Clotilda SAUNDERS, MD [facility]  02/18/2024 11:10 AM Arletta Camie FORBES DEVONNA [facility]  03/21/2024 12:40 PM Alvia Norleen BIRCH, MD [facility] None  04/06/2024  3:15 PM Gaynel Delon CROME, DPM [facility]  04/28/2024 11:20 AM Luke Chiquita SAUNDERS, DO [facility]     I spent a total of 40 minutes during this  encounter with the patient including review of chart and various tests results, discussions about plan of care and coordination of care plan.  This document was completed utilizing speech recognition software. Grammatical errors, random word insertions, pronoun errors, and incomplete sentences are an occasional consequence of this system due to software limitations, ambient noise, and hardware issues. Any formal questions or concerns about the content, text or information contained within the body of this dictation should be directly addressed to the provider for clarification.

## 2024-01-26 ENCOUNTER — Encounter: Payer: Self-pay | Admitting: Oncology

## 2024-01-26 ENCOUNTER — Telehealth: Payer: Self-pay | Admitting: Oncology

## 2024-01-26 DIAGNOSIS — D539 Nutritional anemia, unspecified: Secondary | ICD-10-CM | POA: Diagnosis not present

## 2024-01-26 DIAGNOSIS — D509 Iron deficiency anemia, unspecified: Secondary | ICD-10-CM | POA: Diagnosis not present

## 2024-01-26 DIAGNOSIS — I959 Hypotension, unspecified: Secondary | ICD-10-CM | POA: Diagnosis not present

## 2024-01-26 DIAGNOSIS — I5032 Chronic diastolic (congestive) heart failure: Secondary | ICD-10-CM | POA: Diagnosis not present

## 2024-01-26 DIAGNOSIS — N182 Chronic kidney disease, stage 2 (mild): Secondary | ICD-10-CM | POA: Diagnosis not present

## 2024-01-26 DIAGNOSIS — J449 Chronic obstructive pulmonary disease, unspecified: Secondary | ICD-10-CM | POA: Diagnosis not present

## 2024-01-26 DIAGNOSIS — I214 Non-ST elevation (NSTEMI) myocardial infarction: Secondary | ICD-10-CM | POA: Diagnosis not present

## 2024-01-26 DIAGNOSIS — I130 Hypertensive heart and chronic kidney disease with heart failure and stage 1 through stage 4 chronic kidney disease, or unspecified chronic kidney disease: Secondary | ICD-10-CM | POA: Diagnosis not present

## 2024-01-26 DIAGNOSIS — E1122 Type 2 diabetes mellitus with diabetic chronic kidney disease: Secondary | ICD-10-CM | POA: Diagnosis not present

## 2024-01-26 LAB — HAPTOGLOBIN: Haptoglobin: 103 mg/dL (ref 42–346)

## 2024-01-26 NOTE — Telephone Encounter (Signed)
 Patient has been scheduled for follow-up visit per 01/25/24 LOS.  Pt noted appt details on personal planner/calendar.

## 2024-01-26 NOTE — Assessment & Plan Note (Addendum)
 Chronic anemia with a history of low hemoglobin levels, previously as low as 5.4 in May 2025, improved to 8.1 after transfusions.  Likely due to gastrointestinal bleeding causing slow blood loss.   Currently on oral iron  supplements (Fergon) every other day, tolerated well without constipation.   Occasional black stools and minor blood traces when wiping reported, possibly due to hemorrhoids, though she denies a history of hemorrhoids.  Labs today revealed persistent anemia with hemoglobin of 7.7, MCV 97.  White count and platelet count normal currently.  Iron  studies continue to show evidence of iron  deficiency with iron  saturation of 5%, iron  decreased to 23.  B12, folate within normal limits.  LDH borderline at 217.  Given persistent iron  deficiency anemia despite oral iron  supplementation, we will proceed with IV iron .  We got approval for Venofer .  Will proceed with 5 doses, weekly.  Our clinic will arrange infusion appointments.   - Continue oral iron  supplements (Fergon) every other day.  We expect blood counts to improve with IV iron  supplementation.  Plan to see her again in 2 months for follow-up with repeat labs.

## 2024-01-26 NOTE — Assessment & Plan Note (Signed)
 Breast cancer treated with surgery approximately 20 years ago. Underwent mastectomy with lymph node removal and had breast implants, later removed due to rupture. No chemotherapy, radiation therapy, or hormone therapy post-surgery.

## 2024-02-01 DIAGNOSIS — N182 Chronic kidney disease, stage 2 (mild): Secondary | ICD-10-CM | POA: Diagnosis not present

## 2024-02-01 DIAGNOSIS — I2510 Atherosclerotic heart disease of native coronary artery without angina pectoris: Secondary | ICD-10-CM | POA: Diagnosis not present

## 2024-02-01 DIAGNOSIS — I5032 Chronic diastolic (congestive) heart failure: Secondary | ICD-10-CM | POA: Diagnosis not present

## 2024-02-01 DIAGNOSIS — J449 Chronic obstructive pulmonary disease, unspecified: Secondary | ICD-10-CM | POA: Diagnosis not present

## 2024-02-01 DIAGNOSIS — D539 Nutritional anemia, unspecified: Secondary | ICD-10-CM | POA: Diagnosis not present

## 2024-02-01 DIAGNOSIS — I130 Hypertensive heart and chronic kidney disease with heart failure and stage 1 through stage 4 chronic kidney disease, or unspecified chronic kidney disease: Secondary | ICD-10-CM | POA: Diagnosis not present

## 2024-02-01 DIAGNOSIS — E1122 Type 2 diabetes mellitus with diabetic chronic kidney disease: Secondary | ICD-10-CM | POA: Diagnosis not present

## 2024-02-01 DIAGNOSIS — D509 Iron deficiency anemia, unspecified: Secondary | ICD-10-CM | POA: Diagnosis not present

## 2024-02-01 DIAGNOSIS — I700 Atherosclerosis of aorta: Secondary | ICD-10-CM | POA: Diagnosis not present

## 2024-02-02 ENCOUNTER — Other Ambulatory Visit: Payer: Self-pay

## 2024-02-02 ENCOUNTER — Inpatient Hospital Stay: Attending: Oncology

## 2024-02-02 DIAGNOSIS — D509 Iron deficiency anemia, unspecified: Secondary | ICD-10-CM | POA: Insufficient documentation

## 2024-02-02 DIAGNOSIS — D649 Anemia, unspecified: Secondary | ICD-10-CM

## 2024-02-02 NOTE — Progress Notes (Signed)
 Despite multiple attempts by four different RN's (myself, Crystal B, Katelin and Turkey O), we were unable to obtain IV access. She can only be stuck in her right arm due to previous left mastectomy and lymph node dissection.   Anita Mccormick is arranging for an appointment for pt at [facility] since they have more resources available for starting IVs.   Pt tolerated the visit well and departed with her husband. Awaiting a call  about scheduling.   Dr. Pasam has recommended a PICC line for the remainder of her treatments. He/his staff are arranging the order to place it. Pt advised via telephone. Asked nher to call me back if she hasn't heard anything in two daysd.

## 2024-02-02 NOTE — Progress Notes (Signed)
 PICC line with IR ordered per Dr. Autumn due to difficult venous stick, chronic with need for multiple iron  infusions.

## 2024-02-03 ENCOUNTER — Ambulatory Visit (HOSPITAL_COMMUNITY)
Admission: RE | Admit: 2024-02-03 | Discharge: 2024-02-03 | Disposition: A | Discharge status: Home / Self Care | Source: Ambulatory Visit | Attending: Oncology | Admitting: Oncology

## 2024-02-03 ENCOUNTER — Telehealth: Payer: Self-pay | Admitting: *Deleted

## 2024-02-03 DIAGNOSIS — D509 Iron deficiency anemia, unspecified: Secondary | ICD-10-CM | POA: Diagnosis not present

## 2024-02-03 DIAGNOSIS — D649 Anemia, unspecified: Secondary | ICD-10-CM | POA: Insufficient documentation

## 2024-02-03 MED ORDER — HEPARIN SOD (PORK) LOCK FLUSH 100 UNIT/ML IV SOLN
INTRAVENOUS | Status: AC
  Filled 2024-02-03: qty 5

## 2024-02-03 MED ORDER — LIDOCAINE HCL 1 % IJ SOLN
INTRAMUSCULAR | Status: AC
  Filled 2024-02-03: qty 20

## 2024-02-03 MED ORDER — HEPARIN SOD (PORK) LOCK FLUSH 100 UNIT/ML IV SOLN
500.0000 [IU] | Freq: Once | INTRAVENOUS | Status: AC
  Administered 2024-02-03: 500 [IU] via INTRAVENOUS

## 2024-02-03 MED ORDER — LIDOCAINE HCL 1 % IJ SOLN
20.0000 mL | Freq: Once | INTRAMUSCULAR | Status: AC
  Administered 2024-02-03: 5 mL

## 2024-02-03 NOTE — Telephone Encounter (Signed)
 Call from IR that patient is concerned about having the PICC line in for so long and that she is not going to be able to care for it. Informed IR that we can get her in on Friday afternoon to flush and change the dressing. Will ask MD to discuss this with her on 7/15 when she is here for her iron  infusion.

## 2024-02-03 NOTE — Procedures (Signed)
 Successful placement of single lumen PICC line to right basilic vein. Length 36cm Tip at lower SVC/RA PICC capped No complications Ready for use.  EBL < 5 mL   Solmon Selmer Ku PA-C 02/03/2024 12:08 PM

## 2024-02-05 ENCOUNTER — Inpatient Hospital Stay

## 2024-02-05 VITALS — BP 152/66 | HR 92 | Temp 98.1°F | Resp 18

## 2024-02-05 DIAGNOSIS — Z452 Encounter for adjustment and management of vascular access device: Secondary | ICD-10-CM

## 2024-02-05 DIAGNOSIS — D509 Iron deficiency anemia, unspecified: Secondary | ICD-10-CM | POA: Diagnosis not present

## 2024-02-05 MED ORDER — HEPARIN SOD (PORK) LOCK FLUSH 100 UNIT/ML IV SOLN
250.0000 [IU] | INTRAVENOUS | Status: AC | PRN
  Administered 2024-02-05: 250 [IU]

## 2024-02-05 MED ORDER — SODIUM CHLORIDE 0.9% FLUSH
10.0000 mL | INTRAVENOUS | Status: AC | PRN
  Administered 2024-02-05: 10 mL

## 2024-02-05 NOTE — Patient Instructions (Signed)
 [facility] - A DEPT OF [facility]  Discharge Instructions: Thank you for choosing [facility] to provide your oncology and hematology care.   If you have a lab appointment with the Cancer Center, please go directly to the Cancer Center and check in at the registration area.   Wear comfortable clothing and clothing appropriate for easy access to any Portacath or PICC line.   We strive to give you quality time with your provider. You may need to reschedule your appointment if you arrive late (15 or more minutes).  Arriving late affects you and other patients whose appointments are after yours.  Also, if you miss three or more appointments without notifying the office, you may be dismissed from the clinic at the provider's discretion.      For prescription refill requests, have your pharmacy contact our office and allow 72 hours for refills to be completed.    Today you received the following PICC flush and dressing change.  PICC Insertion, Care After The following information offers guidance on how to care for yourself after your procedure. Your health care provider may also give you more specific instructions. If you have problems or questions, contact your health care provider. What can I expect after the procedure? After the procedure, it is common to have mild discomfort and bruising at your insertion site. Follow these instructions at home: Bathing  Do not take baths, swim, or use a hot tub until your health care provider approves. Ask your health care provider if you may take showers. You may only be allowed to take sponge baths. Keep the bandage (dressing) dry. If it gets wet, it needs to be changed right away to help prevent infection. Activity  Rest as told by your health care provider. Ask your health care provider when you can return to your normal activities. If your PICC is near or at the bend of your elbow, avoid activity with  repeated motion at the elbow. Do not lift anything that is heavier than 10 lb (4.5 kg), or the limit that you are told, until your health care provider says that it is safe. If you use a crutch, do not use it with the arm on the same side as your PICC. You may need to use a walker instead. Avoid any activity that requires great effort as told by your health care provider. General instructions If you were given a sedative during the procedure, it can affect you for several hours. Do not drive or operate machinery until your health care provider says that it is safe. Take over-the-counter and prescription medicines only as told by your health care provider. Flush the PICC as told by your health care provider. Let your health care provider know right away if the PICC is hard to flush or does not flush. Do not use force to flush the PICC. Do not use any products that contain nicotine  or tobacco. These products include cigarettes, chewing tobacco, and vaping devices, such as e-cigarettes. These can delay healing after surgery. If you need help quitting, ask your health care provider. Check your insertion site every day for signs of infection. Check for: More redness, swelling, or pain. Fluid or blood. Warmth. Pus or a bad smell. Keep all follow-up visits. This is important. You will need to have your PICC dressing changed at least once a week. Contact a health care provider if: You have pain in your arm, ear, face, or teeth.  You have a fever or chills. You have more redness, swelling, or pain around your insertion site. You have fluid or blood coming from your insertion site. Your insertion site feels warm to the touch. You have pus or a bad smell coming from your insertion site. Your vein feels hard and raised like a cord under the skin around your insertion site. Your PICC dressing has gotten wet. Your PICC dressing is coming off. Tape it on until it can be changed. Get help right away if: You  have problems with the PICC. These include if your PICC: Is accidentally pulled all the way out. If this happens, cover the insertion site with a gauze dressing. Do not throw the PICC away. Your health care provider will need to check it to be sure the entire catheter came out. Cannot be flushed, is hard to flush, or leaks around the insertion site when flushed. Was tugged or pulled and has partially come out. Do not push the PICC back in. Makes a flushing sound when flushed. Appears to have a hole or tear. You feel your heart racing or skipping beats, or you have chest pain. You have swelling, redness, warmth, or pain in the arm or leg where the PICC is placed. You have a red streak going up your arm that starts under your PICC dressing. You have numbness or tingling in your fingers, hand, or arm. These symptoms may be an emergency. Get help right away. Call 911. Do not wait to see if the symptoms will go away. Do not drive yourself to the hospital. Summary After your procedure, it is common to have mild discomfort and bruising at your insertion site. Do not lift anything that is heavier than 10 lb (4.5 kg), or the limit that you are told, until your health care provider says that it is safe. Flush the PICC as told by your health care provider. Tell your health care provider right away if the PICC is hard to flush or does not flush. Do not use force to flush the PICC. Check your insertion site every day for signs of infection. This information is not intended to replace advice given to you by your health care provider. Make sure you discuss any questions you have with your health care provider. Document Revised: 01/30/2021 Document Reviewed: 01/30/2021 Elsevier Patient Education  2024 Elsevier Inc.   To help prevent nausea and vomiting after your treatment, we encourage you to take your nausea medication as directed.  BELOW ARE SYMPTOMS THAT SHOULD BE REPORTED IMMEDIATELY: *FEVER GREATER  THAN 100.4 F (38 C) OR HIGHER *CHILLS OR SWEATING *NAUSEA AND VOMITING THAT IS NOT CONTROLLED WITH YOUR NAUSEA MEDICATION *UNUSUAL SHORTNESS OF BREATH *UNUSUAL BRUISING OR BLEEDING *URINARY PROBLEMS (pain or burning when urinating, or frequent urination) *BOWEL PROBLEMS (unusual diarrhea, constipation, pain near the anus) TENDERNESS IN MOUTH AND THROAT WITH OR WITHOUT PRESENCE OF ULCERS (sore throat, sores in mouth, or a toothache) UNUSUAL RASH, SWELLING OR PAIN  UNUSUAL VAGINAL DISCHARGE OR ITCHING   Items with * indicate a potential emergency and should be followed up as soon as possible or go to the Emergency Department if any problems should occur.  Please show the CHEMOTHERAPY ALERT CARD or IMMUNOTHERAPY ALERT CARD at check-in to the Emergency Department and triage nurse.  Should you have questions after your visit or need to cancel or reschedule your appointment, please contact [facility] - A DEPT OF [facility]  Dept: [phone]  and follow the prompts.  Office hours are 8:00 a.m. to 4:30 p.m. Monday - Friday. Please note that voicemails left after 4:00 p.m. may not be returned until the following business day.  We are closed weekends and major holidays. You have access to a nurse at all times for urgent questions. Please call the main number to the clinic Dept: [phone] and follow the prompts.   For any non-urgent questions, you may also contact your provider using MyChart. We now offer e-Visits for anyone 14 and older to request care online for non-urgent symptoms. For details visit [email or web address].   Also download the MyChart app! Go to the app store, search MyChart, open the app, select [place], and log in with your MyChart username and password.

## 2024-02-09 ENCOUNTER — Inpatient Hospital Stay

## 2024-02-09 VITALS — BP 123/52 | HR 83 | Temp 97.6°F | Resp 18

## 2024-02-09 DIAGNOSIS — D649 Anemia, unspecified: Secondary | ICD-10-CM

## 2024-02-09 DIAGNOSIS — D509 Iron deficiency anemia, unspecified: Secondary | ICD-10-CM | POA: Diagnosis not present

## 2024-02-09 MED ORDER — HEPARIN SOD (PORK) LOCK FLUSH 100 UNIT/ML IV SOLN
250.0000 [IU] | Freq: Once | INTRAVENOUS | Status: AC | PRN
  Administered 2024-02-09: 250 [IU]

## 2024-02-09 MED ORDER — SODIUM CHLORIDE 0.9 % IV SOLN: INTRAVENOUS | Status: DC

## 2024-02-09 MED ORDER — ALTEPLASE 2 MG IJ SOLR: 2.0000 mg | Freq: Once | INTRAMUSCULAR | Status: DC | PRN

## 2024-02-09 MED ORDER — ACETAMINOPHEN 325 MG PO TABS
650.0000 mg | ORAL_TABLET | Freq: Once | ORAL | Status: AC
  Administered 2024-02-09: 650 mg via ORAL
  Filled 2024-02-09: qty 2

## 2024-02-09 MED ORDER — HEPARIN SOD (PORK) LOCK FLUSH 100 UNIT/ML IV SOLN
500.0000 [IU] | Freq: Once | INTRAVENOUS | Status: DC | PRN
Start: 2024-02-09 — End: 2024-02-09

## 2024-02-09 MED ORDER — SODIUM CHLORIDE 0.9% FLUSH
10.0000 mL | Freq: Once | INTRAVENOUS | Status: AC | PRN
  Administered 2024-02-09: 10 mL

## 2024-02-09 MED ORDER — DIPHENHYDRAMINE HCL 25 MG PO CAPS
25.0000 mg | ORAL_CAPSULE | Freq: Once | ORAL | Status: AC
  Administered 2024-02-09: 25 mg via ORAL
  Filled 2024-02-09: qty 1

## 2024-02-09 MED ORDER — SODIUM CHLORIDE 0.9% FLUSH
3.0000 mL | Freq: Once | INTRAVENOUS | Status: DC | PRN
Start: 2024-02-09 — End: 2024-02-09

## 2024-02-09 MED ORDER — IRON SUCROSE 20 MG/ML IV SOLN
200.0000 mg | Freq: Once | INTRAVENOUS | Status: AC
  Administered 2024-02-09: 200 mg via INTRAVENOUS
  Filled 2024-02-09: qty 10

## 2024-02-09 NOTE — Patient Instructions (Signed)

## 2024-02-09 NOTE — Progress Notes (Signed)
 Here for Venofer  today. C/O abd pain to the left of her navel since March of 2025 (unchanged). Thinks she may have slight abd distention on that side. Last BM 02/08/24. Denies N/V/D. Sees her PCP 02/15/24. Sent message to Dr. Autumn as well.

## 2024-02-10 ENCOUNTER — Telehealth: Payer: Self-pay

## 2024-02-10 NOTE — Telephone Encounter (Signed)
 Telephone call placed to patient regarding rescheduling her Feraheme infusion appointment from the morning to the afternoon. Patient was agreeable and requested a preferred time of 12:00 PM. Appointment was successfully rescheduled to 12:00 PM on Tuesday, 02/23/2024.

## 2024-02-11 DIAGNOSIS — I5032 Chronic diastolic (congestive) heart failure: Secondary | ICD-10-CM | POA: Diagnosis not present

## 2024-02-11 DIAGNOSIS — I700 Atherosclerosis of aorta: Secondary | ICD-10-CM | POA: Diagnosis not present

## 2024-02-11 DIAGNOSIS — J449 Chronic obstructive pulmonary disease, unspecified: Secondary | ICD-10-CM | POA: Diagnosis not present

## 2024-02-11 DIAGNOSIS — N182 Chronic kidney disease, stage 2 (mild): Secondary | ICD-10-CM | POA: Diagnosis not present

## 2024-02-11 DIAGNOSIS — E1122 Type 2 diabetes mellitus with diabetic chronic kidney disease: Secondary | ICD-10-CM | POA: Diagnosis not present

## 2024-02-11 DIAGNOSIS — I130 Hypertensive heart and chronic kidney disease with heart failure and stage 1 through stage 4 chronic kidney disease, or unspecified chronic kidney disease: Secondary | ICD-10-CM | POA: Diagnosis not present

## 2024-02-11 DIAGNOSIS — D509 Iron deficiency anemia, unspecified: Secondary | ICD-10-CM | POA: Diagnosis not present

## 2024-02-11 DIAGNOSIS — I2510 Atherosclerotic heart disease of native coronary artery without angina pectoris: Secondary | ICD-10-CM | POA: Diagnosis not present

## 2024-02-11 DIAGNOSIS — D539 Nutritional anemia, unspecified: Secondary | ICD-10-CM | POA: Diagnosis not present

## 2024-02-12 ENCOUNTER — Ambulatory Visit

## 2024-02-12 DIAGNOSIS — D509 Iron deficiency anemia, unspecified: Secondary | ICD-10-CM | POA: Diagnosis not present

## 2024-02-15 ENCOUNTER — Encounter: Payer: Self-pay | Admitting: Family Medicine

## 2024-02-15 ENCOUNTER — Telehealth: Payer: Self-pay

## 2024-02-15 ENCOUNTER — Ambulatory Visit: Admitting: Family Medicine

## 2024-02-15 VITALS — BP 120/60 | HR 87 | Temp 97.7°F | Ht 62.0 in | Wt 142.6 lb

## 2024-02-15 DIAGNOSIS — I5032 Chronic diastolic (congestive) heart failure: Secondary | ICD-10-CM | POA: Diagnosis not present

## 2024-02-15 DIAGNOSIS — R3915 Urgency of urination: Secondary | ICD-10-CM

## 2024-02-15 DIAGNOSIS — D509 Iron deficiency anemia, unspecified: Secondary | ICD-10-CM

## 2024-02-15 DIAGNOSIS — E1129 Type 2 diabetes mellitus with other diabetic kidney complication: Secondary | ICD-10-CM

## 2024-02-15 DIAGNOSIS — R0602 Shortness of breath: Secondary | ICD-10-CM

## 2024-02-15 DIAGNOSIS — Z7901 Long term (current) use of anticoagulants: Secondary | ICD-10-CM | POA: Diagnosis not present

## 2024-02-15 DIAGNOSIS — K5520 Angiodysplasia of colon without hemorrhage: Secondary | ICD-10-CM | POA: Diagnosis not present

## 2024-02-15 DIAGNOSIS — L309 Dermatitis, unspecified: Secondary | ICD-10-CM | POA: Diagnosis not present

## 2024-02-15 DIAGNOSIS — Z95828 Presence of other vascular implants and grafts: Secondary | ICD-10-CM | POA: Diagnosis not present

## 2024-02-15 DIAGNOSIS — R5383 Other fatigue: Secondary | ICD-10-CM | POA: Diagnosis not present

## 2024-02-15 LAB — POCT URINALYSIS DIPSTICK
Bilirubin, UA: NEGATIVE
Blood, UA: NEGATIVE
Glucose, UA: NEGATIVE
Ketones, UA: NEGATIVE
Leukocytes, UA: NEGATIVE
Nitrite, UA: NEGATIVE
Protein, UA: NEGATIVE
Spec Grav, UA: 1.01 (ref 1.010–1.025)
Urobilinogen, UA: NEGATIVE U/dL — AB
pH, UA: 6 (ref 5.0–8.0)

## 2024-02-15 NOTE — Patient Instructions (Addendum)
 Your urine does not have any signs of infection.  Given this continued sensation of urinary frequency I would suggest following up with urology.  A referral was placed.  Continue to monitor for any new dark spots on your arms or rest of body.  Take note if they do occur if it is after an iron  infusion.

## 2024-02-15 NOTE — Progress Notes (Unsigned)
 Established Patient Office Visit   Subjective  Patient ID: Anita Mccormick, female    DOB: 12/06/1945  Age: 78 y.o. MRN: [id number]  Chief Complaint  Patient presents with  . Medical Management of Chronic Issues    Pt is here for her 3 months f/u  . Abdominal Pain    Pt c/o of discomfort on left side abdomen, beside her umbilical. Would like to discuss with provider on gastro   . Urinary Urgency    Pt c/o felt like the need to urinate, but doesn't need to go. Going on couple of weeks.     Patient is a 77 year old female seen for follow-up/ongoing concerns.  Patient endorses sensation of urinary urgency but does not have to go.  Has sensation of still urinating despite having stopped which causes her to strain.  Drinking more water.  Having LLQ abd pain.  Denies constipation.  Abdominal Pain    Patient Active Problem List   Diagnosis Date Noted  . Anemia due to chronic blood loss 12/26/2023  . Gastric AVM 12/26/2023  . AVM (arteriovenous malformation) of small bowel, acquired 12/26/2023  . Gastrointestinal hemorrhage 12/24/2023  . Acute on chronic anemia 12/23/2023  . Heme positive stool 11/08/2023  . Non-ST elevation (NSTEMI) myocardial infarction (HCC) 10/21/2023  . Malnutrition of moderate degree 10/16/2023  . Acute respiratory failure with hypoxia (HCC) 10/13/2023  . Influenza A 10/13/2023  . Cardiac arrest (HCC) 10/13/2023  . Pneumothorax on left 10/13/2023  . Central retinal artery occlusion of right eye 10/25/2021  . Symptomatic anemia 06/06/2021  . Atherosclerosis of aorta (HCC) 05/18/2020  . Cirrhosis of liver without ascites (HCC) 05/18/2020  . Internal hemorrhoids 06/22/2019  . Chronic kidney disease (CKD), stage II (mild) 09/19/2017  . Leukopenia 09/19/2017  . Acute on chronic diastolic HF (heart failure) (HCC) 09/19/2017  . Statins contraindicated 06/12/2017  . Hepatotoxicity due to statin drug 06/12/2017  . History of adenomatous polyp of colon 03/18/2017  .  Viral URI with cough 01/17/2017  . Thrombocytopenia (HCC) 12/19/2015  . Insomnia 08/21/2015  . Nicotine  use disorder 05/21/2015  . Eczema 05/21/2015  . Fatty liver disease, nonalcoholic 07/14/2014  . Hyperlipidemia 11/03/2007  . History of CVA (cerebrovascular accident) 10/20/2007  . Type II diabetes mellitus with renal manifestations (HCC) 12/10/2006  . Essential hypertension 12/10/2006  . History of breast cancer 12/10/2006   Past Medical History:  Diagnosis Date  . Arthritis   . Cancer [facility])    breast left  . Cerebrovascular accident [facility])    October 29 2021- Left eye- blind  . COLONIC POLYPS, HX OF 12/10/2006   Qualifier: Diagnosis of  By: Tammie MD, Bruce    . Diabetes mellitus type II, controlled (HCC) 12/10/2006   Poor control on Januvia  100mg  and glipizide  10mg  XL Lab Results  Component Value Date   HGBA1C 8.3* 11/02/2014       . Eczema   . Fatty liver disease, nonalcoholic 07/14/2014   Per Dr. Tammie Lab Results  Component Value Date   ALT 31 11/02/2014   AST 57* 11/02/2014   ALKPHOS 104 11/02/2014   BILITOT 1.1 11/02/2014      . GERD (gastroesophageal reflux disease)   . History of blood transfusion   . History of kidney stones 2023   11/28/21 small stone currently, taking flomax   . Hypertension   . Iron  deficiency anemia, unspecified 05/08/2023  . Sepsis (HCC) 09/19/2017  . Sepsis due to Escherichia coli (E. coli) (HCC)   .  Symptomatic anemia 06/06/2021  . UTI (urinary tract infection) 09/19/2017  . Vaginal discharge 04/01/2022   Past Surgical History:  Procedure Laterality Date  . ARTERY BIOPSY Left 11/29/2021   Procedure: LEFT BIOPSY TEMPORAL ARTERY;  Surgeon: Eliza Lonni RAMAN, MD;  Location: [facility] OR;  Service: Vascular;  Laterality: Left;  . BIOPSY OF SKIN SUBCUTANEOUS TISSUE AND/OR MUCOUS MEMBRANE  11/10/2023   Procedure: BIOPSY;  Surgeon: Federico Rosario BROCKS, MD;  [facility] END[facility]COPY;  Service: Gastroenterology;;  . CATARACT EXTRACTION Bilateral   . COLONOSCOPY  N/A 11/10/2023   Procedure: COLONOSCOPY;  Surgeon: Federico Rosario BROCKS, MD;  Location: [facility] ENDOSCOPY;  Service: Gastroenterology;  Laterality: N/A;  . ESOPHAGOGASTRODUODENOSCOPY N/A 11/10/2023   Procedure: EGD (ESOPHAGOGASTRODUODENOSCOPY);  Surgeon: Federico Rosario BROCKS, MD;  Location: [facility] ENDOSCOPY;  Service: Gastroenterology;  Laterality: N/A;  . GIVENS CAPSULE STUDY N/A 12/25/2023   Procedure: IMAGING PROCEDURE, GI TRACT, INTRALUMINAL, VIA CAPSULE;  Surgeon: Legrand Victory LITTIE DOUGLAS, MD;  Location: [facility] ENDOSCOPY;  Service: Gastroenterology;  Laterality: N/A;  . HOT HEMOSTASIS N/A 11/10/2023   Procedure: EGD, WITH ARGON PLASMA COAGULATION;  Surgeon: Federico Rosario BROCKS, MD;  Location: [facility] ENDOSCOPY;  Service: Gastroenterology;  Laterality: N/A;  . LEFT HEART CATH AND CORONARY ANGIOGRAPHY N/A 10/20/2023   Procedure: LEFT HEART CATH AND CORONARY ANGIOGRAPHY;  Surgeon: [place]azil[place], Peter M, MD;  Location: [facility] INVASIVE CV LAB;  Service: Cardiovascular;  Laterality: N/A;  . MASTECTOMY Left   . POLYPECTOMY  11/10/2023   Procedure: POLYPECTOMY, INTESTINE;  Surgeon: Federico Rosario BROCKS, MD;  Location: [facility] ENDOSCOPY;  Service: Gastroenterology;;  . TUBAL LIGATION     Social History   Tobacco Use  . Smoking status: Former    Current packs/day: 0.20    Average packs/day: 0.2 packs/day for 10.0 years (2.0 ttl pk-yrs)    Types: Cigarettes  . Smokeless tobacco: Never  Vaping Use  . Vaping status: Never Used  Substance Use Topics  . Alcohol use: Not Currently  . Drug use: No   Family History  Problem Relation Age of Onset  . Stroke Mother   . Cancer Father        prostate  . Diabetes Sister   . Pancreatic cancer Sister    Allergies  Allergen Reactions  . Bee Venom Swelling  . Penicillins Hives  . Ace Inhibitors Cough    ????  . Chlorthalidone     REACTION: unspecified  . Lactose Intolerance (Gi) Diarrhea  . Metformin      REACTION: gi side effects  . Sulfamethoxazole     REACTION: questionable    Review of Systems   Gastrointestinal:  Positive for abdominal pain.   Negative unless stated above    Objective:     BP 120/60 (BP Location: Left Arm, Patient Position: Sitting, Cuff Size: Normal)   Pulse 87   Temp 97.7 F (36.5 C) (Oral)   Ht 5' 2 (1.575 m)   Wt 142 lb 9.6 oz (64.7 kg)   SpO2 98%   BMI 26.08 kg/m  BP Readings from Last 3 Encounters:  02/15/24 120/60  02/12/24 (!) 127/46  02/09/24 (!) 123/52   Wt Readings from Last 3 Encounters:  02/15/24 142 lb 9.6 oz (64.7 kg)  01/25/24 146 lb (66.2 kg)  01/06/24 146 lb 9.6 oz (66.5 kg)      Physical Exam Constitutional:      General: She is not in acute distress.    Appearance: Normal appearance.  HENT:     Head: Normocephalic and atraumatic.  Nose: Nose normal.     Mouth/Throat:     Mouth: Mucous membranes are moist.  Cardiovascular:     Rate and Rhythm: Normal rate and regular rhythm.     Heart sounds: Normal heart sounds. No murmur heard.    No gallop.  Pulmonary:     Effort: Pulmonary effort is normal. No respiratory distress.     Breath sounds: Normal breath sounds. No wheezing, rhonchi or rales.  Abdominal:     General: Bowel sounds are normal.     Palpations: Abdomen is soft.     Tenderness: There is abdominal tenderness in the left upper quadrant and left lower quadrant. There is no guarding.  Skin:    General: Skin is warm and dry.  Neurological:     Mental Status: She is alert and oriented to person, place, and time.        01/25/2024   12:16 PM 01/25/2024   12:10 PM 11/18/2023    2:52 PM  Depression screen PHQ 2/9  Decreased Interest 0 0 2  Down, Depressed, Hopeless 0 0 2  PHQ - 2 Score 0 0 4  Altered sleeping   2  Tired, decreased energy   3  Change in appetite   1  Feeling bad or failure about yourself    1  Trouble concentrating   0  Moving slowly or fidgety/restless   3  Suicidal thoughts   0  PHQ-9 Score   14  Difficult doing work/chores   Not difficult at all      11/18/2023    2:53 PM  07/06/2023    8:52 AM 10/29/2022   11:46 AM  GAD 7 : Generalized Anxiety Score  Nervous, Anxious, on Edge 3 0 0  Control/stop worrying 0 0 0  Worry too much - different things 0 0 0  Trouble relaxing 1 0 0  Restless 0 0 0  Easily annoyed or irritable 2 0 0  Afraid - awful might happen 0 0 0  Total GAD 7 Score 6 0 0  Anxiety Difficulty Not difficult at all Not difficult at all Not difficult at all     Results for orders placed or performed in visit on 02/15/24  POC Urinalysis Dipstick  Result Value Ref Range   Color, UA yellow    Clarity, UA clear    Glucose, UA Negative Negative   Bilirubin, UA Negative    Ketones, UA Negative    Spec Grav, UA 1.010 1.010 - 1.025   Blood, UA Negative    pH, UA 6.0 5.0 - 8.0   Protein, UA Negative Negative   Urobilinogen, UA negative (A) 0.2 or 1.0 E.U./dL   Nitrite, UA negative    Leukocytes, UA Negative Negative   Appearance     Odor        Assessment & Plan:   Urinary urgency -     POCT urinalysis dipstick -     Ambulatory referral to Urology  Dermatitis  Iron  deficiency anemia, unspecified iron  deficiency anemia type  Status post peripherally inserted central catheter (PICC) central line placement  SOB (shortness of breath)  Other fatigue  Chronic diastolic CHF (congestive heart failure) (HCC)  AVM (arteriovenous malformation) of small bowel, acquired  Chronic anticoagulation  Type 2 diabetes mellitus with other diabetic kidney complication, without long-term current use of insulin  (HCC)    Return in about 3 months (around 05/17/2024), or if symptoms worsen or fail to improve.   Clotilda JONELLE Single, MD

## 2024-02-15 NOTE — Telephone Encounter (Signed)
 Telephone call placed to patient; unable to reach her. A voice message was left informing her that her infusion appointment for Tuesday, 02/16/2024, has been rescheduled from 9:30 AM to 10:30 AM.  Follow-up call placed to patient's daughter, Gordy. She informed this Clinical research associate that she is currently in [place]. She requested clarification regarding the patient's upcoming appointments and stated that she will review the details in the patient's MyChart.

## 2024-02-16 ENCOUNTER — Inpatient Hospital Stay

## 2024-02-16 VITALS — BP 136/55 | HR 84 | Temp 97.3°F | Resp 18

## 2024-02-16 DIAGNOSIS — D509 Iron deficiency anemia, unspecified: Secondary | ICD-10-CM | POA: Diagnosis not present

## 2024-02-16 DIAGNOSIS — D649 Anemia, unspecified: Secondary | ICD-10-CM

## 2024-02-16 MED ORDER — HEPARIN SOD (PORK) LOCK FLUSH 100 UNIT/ML IV SOLN
250.0000 [IU] | Freq: Once | INTRAVENOUS | Status: AC | PRN
  Administered 2024-02-16: 250 [IU]

## 2024-02-16 MED ORDER — ACETAMINOPHEN 325 MG PO TABS
650.0000 mg | ORAL_TABLET | Freq: Once | ORAL | Status: AC
  Administered 2024-02-16: 650 mg via ORAL
  Filled 2024-02-16: qty 2

## 2024-02-16 MED ORDER — SODIUM CHLORIDE 0.9% FLUSH
3.0000 mL | Freq: Once | INTRAVENOUS | Status: AC | PRN
  Administered 2024-02-16: 3 mL

## 2024-02-16 MED ORDER — IRON SUCROSE 20 MG/ML IV SOLN
200.0000 mg | Freq: Once | INTRAVENOUS | Status: AC
  Administered 2024-02-16: 200 mg via INTRAVENOUS
  Filled 2024-02-16: qty 10

## 2024-02-16 MED ORDER — SODIUM CHLORIDE 0.9 % IV SOLN
INTRAVENOUS | Status: DC
Start: 2024-02-16 — End: 2024-02-16

## 2024-02-16 MED ORDER — DIPHENHYDRAMINE HCL 25 MG PO CAPS
25.0000 mg | ORAL_CAPSULE | Freq: Once | ORAL | Status: AC
  Administered 2024-02-16: 25 mg via ORAL
  Filled 2024-02-16: qty 1

## 2024-02-16 NOTE — Progress Notes (Signed)
 Spoke with patient, spouse, and daughter regarding weekly PICC line care. It was confirmed that home care is not currently managing this, and the clinic will assume responsibility. Flush and dressing change appointments have been scheduled through the end of August 2025. [place], RN has been notified that if the patient still has PICC line access beyond August, additional weekly care appointments will need to be scheduled. Notified treatment nurse to provide the patient with a printed copy of her scheduled appointments.

## 2024-02-16 NOTE — Patient Instructions (Signed)

## 2024-02-17 DIAGNOSIS — I700 Atherosclerosis of aorta: Secondary | ICD-10-CM | POA: Diagnosis not present

## 2024-02-17 DIAGNOSIS — D509 Iron deficiency anemia, unspecified: Secondary | ICD-10-CM | POA: Diagnosis not present

## 2024-02-17 DIAGNOSIS — N182 Chronic kidney disease, stage 2 (mild): Secondary | ICD-10-CM | POA: Diagnosis not present

## 2024-02-17 DIAGNOSIS — E1122 Type 2 diabetes mellitus with diabetic chronic kidney disease: Secondary | ICD-10-CM | POA: Diagnosis not present

## 2024-02-17 DIAGNOSIS — I130 Hypertensive heart and chronic kidney disease with heart failure and stage 1 through stage 4 chronic kidney disease, or unspecified chronic kidney disease: Secondary | ICD-10-CM | POA: Diagnosis not present

## 2024-02-17 DIAGNOSIS — J449 Chronic obstructive pulmonary disease, unspecified: Secondary | ICD-10-CM | POA: Diagnosis not present

## 2024-02-17 DIAGNOSIS — I5032 Chronic diastolic (congestive) heart failure: Secondary | ICD-10-CM | POA: Diagnosis not present

## 2024-02-17 DIAGNOSIS — I2510 Atherosclerotic heart disease of native coronary artery without angina pectoris: Secondary | ICD-10-CM | POA: Diagnosis not present

## 2024-02-17 DIAGNOSIS — D539 Nutritional anemia, unspecified: Secondary | ICD-10-CM | POA: Diagnosis not present

## 2024-02-17 NOTE — Progress Notes (Unsigned)
 Anita Mccormick [id number] January 15, 1946   Chief Complaint: Iron  deficiency anemia  Referring Provider: Mercer Clotilda SAUNDERS, MD Primary GI MD: Dr. Legrand  HPI: Anita Mccormick is a 78 y.o. female with past medical history of arthritis, breast cancer, hypertension, CAD s/p PEA cardiac arrest and NSTEMI 09/2023, CHF, diabetes mellitus type 2, kidney stones, CKD stage II, MASLD/cirrhosis, iron  deficiency anemia, cytopenia, GERD, gastric and duodenal AVM, diverticulosis, and colon polyps who presents today for deficiency anemia.    She was previously admitted to the hospital 3/18 - 11/11/2023 for acute respiratory failure secondary to influenza A treated with Tamiflu . Her hospital course was complicated by syncope, hypoxia followed by PEA cardiac arrest requiring CPR x 3 on 10/14/2023 and was subsequently diagnosed with a NSTEMI. She required intubation, pressors and admission to the ICU. She developed a left pneumothorax and required a chest tube. Cardiac cath on 10/20/2023 which did not show any disease amenable to PCI and medical therapy was recommended with aspirin . She was seen by Dr. Legrand with our inpatient GI service during this hospital admission after her Hgb level continued to downtrend, hemoglobin 7.1 on 4/11.  She was transfused 2 units of PRBCs and received IV iron . She underwent an EGD and colonoscopy 11/10/2023, the EGD identified 4 nonbleeding AVMs in the stomach and a single nonbleeding AVM in the duodenum which were treated with APC.  The EGD also identified esophageal candidiasis treated with Fluconazole . The colonoscopy identified 2 tubular adenomatous polyps removed from the colon. She was discharged home on Pantoprazole  40 mg twice daily for 8 weeks then daily.  Hemoglobin was 9.3 at discharge on 4/16.  She has been staying with her daughter since discharge.   Patient admitted to the hospital from 12/23/2023 to 12/27/2023 for acute on chronic anemia having been sent to the ED by her PCP for low  hemoglobin.  Hemoglobin 5.4 on presentation.  She received 2 units PRBCs.  Hemoglobin 9 on day of discharge. Patient suspected to have upper GI bleeding.  Known history of AVMs/angioectasias from EGD 4/15. GI was consulted. She underwent capsule endoscopy 5/30.  No bleeding source was identified on capsule study, though it was noted that with a relatively fast transit time, small findings such as AVMs may not be visualized.  Not amenable to endoscopic intervention.  Recommendation for hematology referral in outpatient setting to recheck iron  stores and to be established for possible IV iron  supplementation.   Patient states she has been doing fairly well.  She follows with hematology and has been receiving iron  infusions, and has had a PICC line placed due to difficulty accessing her veins.  Last infusion 02/16/2024.  She is also still taking an oral iron  supplement.  She is having daily bowel movements but has noticed harder stools on iron .  Stools are also dark on iron  but she denies tarry or loose stools.  Has 1 formed bowel movement daily.  Denies BRBPR.  She is taking her PPI denies any problems with heartburn or acid reflux.  Denies nausea, vomiting, hematemesis.  She reports getting choked on liquids, and states that she chews her food very thoroughly to avoid choking on solids.  Denies any food getting stuck in her throat or regurgitation.  States she has discussed this with her daughter and they are thinking about having a speech therapy evaluation.  Does have some problems with gas and bloating when she consumes dairy. Has had mild, intermittent pain in her left lower quadrant which comes and  goes and which she attributes to not being able to empty her bladder completely.  Has been having some urinary urgency but denies painful urination.  Recently seen by PCP for this complaint and has been referred to urology.  Reports she was unaware she had cirrhosis since she does not have a history of  heavy alcohol use.  She was aware she had a history of fatty liver.  She is not interested in being referred to liver transplant clinic at this time.  She denies any confusion, abdominal swelling or lower extremity swelling.  She reports she has shortness of breath which is stable and not worsening.  Uses an inhaler.  Previous GI Procedures/Imaging   CT angio GI bleed 12/23/2023 1. No evidence of GI bleed. 2. Colonic diverticulosis without diverticulitis. 3. Hepatic cirrhosis with sequela of portal hypertension including periesophageal varices. 4. Patchy ground-glass opacities in the lower lobes are favored infectious/inflammatory. 5. 8 mm right lower lobe nodule. Non-contrast chest CT at 6-12 months is recommended. If the nodule is stable at time of repeat CT, then future CT at 18-24 months (from today's scan) is considered optional for low-risk patients, but is recommended for high-risk patients. This recommendation follows the consensus statement: Guidelines for Management of Incidental Pulmonary Nodules Detected on CT Images: From the Fleischner Society 2017; Radiology 2017; 284:228-243. 6. Aortic Atherosclerosis (ICD10-I70.0). 7. Ventral hernia containing fat in the left paramedian region inferior to the umbilicus. This is similar to prior.  EGD 11/10/2023: - White nummular lesions in esophageal mucosa. Biopsied.  - Four non-bleeding angioectasias in the stomach. Treated with argon plasma coagulation (APC).  - Gastritis. Biopsied.  - A single non-bleeding angioectasia in the duodenum. Treated with argon plasma coagulation (APC).  - Duodenal lipoma. - Non-bleeding duodenal diverticulum. Path: A. STOMACH, BIOPSY:  Reactive gastropathy showing foveolar hyperplasia and focal activity  Helicobacter stain negative (IHC, adequate control)  Negative for intestinal metaplasia, dysplasia and carcinoma   B. ESOPHAGUS, BIOPSY:  Reactive squamous mucosa with Candida and bacterial  colonies  Negative for acute inflammation  Negative for glandular epithelium, eosinophils, dysplasia and carcinoma  Colonoscopy 11/10/2023: - The examined portion of the ileum was normal.  - Two 3 to 12 mm polyps in the transverse colon, removed with a cold snare. Resected and retrieved. - Nonbleeding internal hemorrhoids - Recall colonoscopy 3 years Path: C. TRANSVERSE COLON, POLYPECTOMY:  Tubular adenoma, 5 fragments  Negative for high-grade dysplasia and carcinoma    Colonoscopy 08/22/2019:  3 polyps removed largest 10 mm and noted to have large internal hemorrhoids. Path from the polyp showed the rectal polyp. Tubulovillous adenoma and descending colon polyp was a tubular adenoma   Past Medical History:  Diagnosis Date   Arthritis    Cancer (HCC)    breast left   Cerebrovascular accident [facility])    October 29 2021- Left eye- blind   COLONIC POLYPS, HX OF 12/10/2006   Qualifier: Diagnosis of  By: Tammie MD, Bruce     Diabetes mellitus type II, controlled (HCC) 12/10/2006   Poor control on Januvia  100mg  and glipizide  10mg  XL Lab Results  Component Value Date   HGBA1C 8.3* 11/02/2014        Eczema    Fatty liver disease, nonalcoholic 07/14/2014   Per Dr. Tammie Lab Results  Component Value Date   ALT 31 11/02/2014   AST 57* 11/02/2014   ALKPHOS 104 11/02/2014   BILITOT 1.1 11/02/2014       GERD (gastroesophageal reflux disease)  History of blood transfusion    History of kidney stones 2023   11/28/21 small stone currently, taking flomax    Hypertension    Iron  deficiency anemia, unspecified 05/08/2023   Sepsis (HCC) 09/19/2017   Sepsis due to Escherichia coli (E. coli) (HCC)    Symptomatic anemia 06/06/2021   UTI (urinary tract infection) 09/19/2017   Vaginal discharge 04/01/2022    Past Surgical History:  Procedure Laterality Date   ARTERY BIOPSY Left 11/29/2021   Procedure: LEFT BIOPSY TEMPORAL ARTERY;  Surgeon: Eliza Lonni RAMAN, MD;  Location: [facility] OR;  Service:  Vascular;  Laterality: Left;   BIOPSY OF SKIN SUBCUTANEOUS TISSUE AND/OR MUCOUS MEMBRANE  11/10/2023   Procedure: BIOPSY;  Surgeon: Federico Rosario BROCKS, MD;  Location: [facility] ENDOSCOPY;  Service: Gastroenterology;;   CATARACT EXTRACTION Bilateral    COLONOSCOPY N/A 11/10/2023   Procedure: COLONOSCOPY;  Surgeon: Federico Rosario BROCKS, MD;  Location: [facility] ENDOSCOPY;  Service: Gastroenterology;  Laterality: N/A;   ESOPHAGOGASTRODUODENOSCOPY N/A 11/10/2023   Procedure: EGD (ESOPHAGOGASTRODUODENOSCOPY);  Surgeon: Federico Rosario BROCKS, MD;  Location: [facility] ENDOSCOPY;  Service: Gastroenterology;  Laterality: N/A;   GIVENS CAPSULE STUDY N/A 12/25/2023   Procedure: IMAGING PROCEDURE, GI TRACT, INTRALUMINAL, VIA CAPSULE;  Surgeon: Anita Mccormick Victory LITTIE DOUGLAS, MD;  Location: [facility] ENDOSCOPY;  Service: Gastroenterology;  Laterality: N/A;   HOT HEMOSTASIS N/A 11/10/2023   Procedure: EGD, WITH ARGON PLASMA COAGULATION;  Surgeon: Federico Rosario BROCKS, MD;  Location: [facility] ENDOSCOPY;  Service: Gastroenterology;  Laterality: N/A;   LEFT HEART CATH AND CORONARY ANGIOGRAPHY N/A 10/20/2023   Procedure: LEFT HEART CATH AND CORONARY ANGIOGRAPHY;  Surgeon: [place], Peter M, MD;  Location: [facility] INVASIVE CV LAB;  Service: Cardiovascular;  Laterality: N/A;   MASTECTOMY Left    POLYPECTOMY  11/10/2023   Procedure: POLYPECTOMY, INTESTINE;  Surgeon: Federico Rosario BROCKS, MD;  Location: [facility] ENDOSCOPY;  Service: Gastroenterology;;   TUBAL LIGATION      Current Outpatient Medications  Medication Sig Dispense Refill   ACCU-CHEK GUIDE TEST test strip 1 each by Other route 3 (three) times daily.     acetaminophen  (TYLENOL ) 325 MG tablet Take 2 tablets (650 mg total) by mouth every 6 (six) hours as needed for mild pain (pain score 1-3) (or Fever >/= 101). (Patient taking differently: Take 325 mg by mouth every 6 (six) hours as needed for mild pain (pain score 1-3) (or Fever >/= 101).)     albuterol  (VENTOLIN  HFA) 108 (90 Base) MCG/ACT inhaler Inhale 2 puffs into the lungs every 6 (six) hours  as needed for wheezing or shortness of breath. 6.7 g 0   aspirin  EC 81 MG tablet Take 81 mg by mouth daily. Swallow whole.     atorvastatin  (LIPITOR ) 80 MG tablet Take 1 tablet (80 mg total) by mouth daily. 90 tablet 3   Blood Glucose Monitoring Suppl (ACCU-CHEK GUIDE ME) w/Device KIT Inject 1 Units into the skin 3 (three) times daily.     Budeson-Glycopyrrol-Formoterol  (BREZTRI AEROSPHERE IN) Inhale 2 puffs into the lungs 2 (two) times daily.     clopidogrel  (PLAVIX ) 75 MG tablet Take 1 tablet (75 mg total) by mouth daily. (Patient not taking: Reported on 02/15/2024) 90 tablet 2   Dulaglutide  (TRULICITY ) 1.5 MG/0.5ML SOAJ Inject 1.5 mg into the skin once a week.     ferrous gluconate  (FERGON) 324 MG tablet Take 1 tablet (324 mg total) by mouth every other day.     glipiZIDE  (GLUCOTROL  XL) 10 MG 24 hr tablet TAKE 1 TABLET TWICE DAILY 180  tablet 3   glucose 4 GM chewable tablet Chew 1 tablet by mouth as needed for low blood sugar.     metoprolol  succinate (TOPROL -XL) 25 MG 24 hr tablet Take 1 tablet (25 mg total) by mouth daily. 90 tablet 1   Multiple Vitamin (MULTIVITAMIN) tablet Take 1 tablet by mouth daily.     nitroGLYCERIN  (NITROSTAT ) 0.4 MG SL tablet Place 1 tablet (0.4 mg total) under the tongue every 5 (five) minutes as needed for chest pain. 10 tablet 5   pantoprazole  (PROTONIX ) 40 MG tablet Take 1 tablet (40 mg total) by mouth daily. (Patient taking differently: Take 40 mg by mouth 2 (two) times daily.) 90 tablet 3   No current facility-administered medications for this visit.    Allergies as of 02/18/2024 - Review Complete 02/15/2024  Allergen Reaction Noted   Bee venom Swelling 11/28/2021   Penicillins Hives 10/08/2006   Ace inhibitors Cough 03/23/2013   Chlorthalidone  10/08/2006   Lactose intolerance (gi) Diarrhea 10/24/2023   Metformin   07/08/2006   Sulfamethoxazole  10/08/2006    Family History  Problem Relation Age of Onset   Stroke Mother    Cancer Father         prostate   Diabetes Sister    Pancreatic cancer Sister     Social History   Tobacco Use   Smoking status: Former    Current packs/day: 0.20    Average packs/day: 0.2 packs/day for 10.0 years (2.0 ttl pk-yrs)    Types: Cigarettes   Smokeless tobacco: Never  Vaping Use   Vaping status: Never Used  Substance Use Topics   Alcohol use: Not Currently   Drug use: No     Review of Systems:    Constitutional: No unexplained weight loss, fever, chills, weakness or fatigue Cardiovascular: No chest pain, chest pressure or palpitations   Respiratory: Positive shortness of breath Gastrointestinal: See HPI and otherwise negative Genitourinary: No dysuria, positive urinary urgency and sensation of incomplete bladder emptying Neurological: No headache, dizziness or syncope Musculoskeletal: No new muscle or joint pain Hematologic: No bleeding or bruising    Physical Exam:  Vital signs: BP 110/72   Pulse 82   Ht 5' 2 (1.575 m)   Wt 143 lb 4 oz (65 kg)   SpO2 98%   BMI 26.20 kg/m    Constitutional: Pleasant female in NAD, alert and cooperative, uses a walker Head:  Normocephalic and atraumatic.  Eyes: No scleral icterus.  Mouth: No oral lesions Respiratory: Respirations even and unlabored. Lungs clear to auscultation bilaterally.  No wheezes, crackles, or rhonchi.  Cardiovascular:  Regular rate and rhythm. No murmurs. No peripheral edema. Gastrointestinal:  Soft, nondistended, nontender. No rebound or guarding. Normal bowel sounds. No appreciable masses or hepatomegaly.  No appreciable ascites. Rectal:  Not performed.  Neurologic:  Alert and oriented x4;  grossly normal neurologically.  Negative asterixis Skin:   Dry and intact without significant lesions or rashes. Psychiatric: Oriented to person, place and time. Demonstrates good judgement and reason without abnormal affect or behaviors.   RELEVANT LABS AND IMAGING: CBC    Component Value Date/Time   WBC 5.1 01/25/2024 1259    WBC 5.1 01/06/2024 1427   RBC 2.76 (L) 01/25/2024 1259   HGB 7.7 (L) 01/25/2024 1259   HGB 8.0 (L) 12/07/2023 1313   HCT 26.8 (L) 01/25/2024 1259   HCT 25.9 (L) 12/07/2023 1313   PLT 149 (L) 01/25/2024 1259   PLT 132 (L) 12/07/2023 1313   MCV  97.1 01/25/2024 1259   MCV 103 (H) 12/07/2023 1313   MCH 27.9 01/25/2024 1259   MCHC 28.7 (L) 01/25/2024 1259   RDW 16.7 (H) 01/25/2024 1259   RDW 14.5 12/07/2023 1313   LYMPHSABS 0.6 (L) 01/25/2024 1259   MONOABS 0.6 01/25/2024 1259   EOSABS 0.6 (H) 01/25/2024 1259   BASOSABS 0.0 01/25/2024 1259    CMP     Component Value Date/Time   NA 144 01/25/2024 1259   NA 141 12/07/2023 1313   K 3.9 01/25/2024 1259   CL 109 01/25/2024 1259   CO2 26 01/25/2024 1259   GLUCOSE 105 (H) 01/25/2024 1259   GLUCOSE 328 (H) 07/02/2006 1110   BUN 13 01/25/2024 1259   BUN 13 12/07/2023 1313   CREATININE 1.09 (H) 01/25/2024 1259   CREATININE 1.00 01/06/2024 1427   CALCIUM  9.7 01/25/2024 1259   PROT 6.9 01/25/2024 1259   PROT 6.0 12/07/2023 1313   ALBUMIN  4.0 01/25/2024 1259   ALBUMIN  3.7 (L) 12/07/2023 1313   AST 28 01/25/2024 1259   ALT 19 01/25/2024 1259   ALKPHOS 120 01/25/2024 1259   BILITOT 0.6 01/25/2024 1259   GFRNONAA 52 (L) 01/25/2024 1259   GFRAA >60 11/16/2017 1800    MELD 3.0: 17 at 12/25/2023  8:49 AM MELD-Na: 14 at 12/25/2023  8:49 AM Calculated from: Serum Creatinine: 1.03 mg/dL at 4/69/7974  1:50 AM Serum Sodium: 139 mmol/L (Using max of 137 mmol/L) at 12/25/2023  8:49 AM Total Bilirubin: 4.6 mg/dL at 4/70/7974  7:95 PM Serum Albumin : 3.1 g/dL at 4/70/7974  7:95 PM INR(ratio): 1.2 at 12/23/2023  7:00 PM Age at listing (hypothetical): 37 years Sex: Female at 12/25/2023  8:49 AM    Assessment/Plan:   MASLD cirrhosis AVM of stomach and small bowel Iron  deficiency anemia Oropharyngeal dysphagia  Patient with recent hospital admissions for GI bleeding.  During admission in March/April had EGD/colonoscopy which revealed 4  nonbleeding AVMs in the stomach and a single nonbleeding AVM in the duodenum which were treated with APC.  Also found to have esophageal candidiasis treated with fluconazole .  2 tubular adenomatous polyps removed from colon.  Again admitted May/June for acute on chronic anemia and received 2 units PRBCs but did not undergo repeat endoscopic procedures.  She did have a capsule endoscopy with no bleeding source identified though noted to have a relatively fast transit time. She is now following with hematology and is on oral iron  supplementation as well as receiving IV iron , has a PICC line in place.  Has had some oropharyngeal dysphagia and feels she gets choked on liquids but declines to schedule speech therapy evaluation at this time.  She has been diagnosed with MASLD cirrhosis, MELD score 17.  Negative ascites, confusion, or asterixis on exam today.  She declines referral to liver transplant clinic at this time.  Declines blood work today.  Will have hemoglobin and iron  levels followed by hematology.  She will need repeat imaging of the liver in November.  Discussed placing order for this now, she would like to wait until follow-up.  Continue daily pantoprazole  40 mg.    Camie Furbish, PA-C [facility] 02/17/2024, 7:21 PM  Patient Care Team: Anita Clotilda SAUNDERS, MD as PCP - General (Family Medicine) [place], Peter M, MD as PCP - Cardiology (Cardiology) Octavia Bruckner, MD as Consulting Physician (Ophthalmology) Lionell Jon DEL, [facility] (Pharmacist)

## 2024-02-18 ENCOUNTER — Encounter: Payer: Self-pay | Admitting: Oncology

## 2024-02-18 ENCOUNTER — Encounter: Payer: Self-pay | Admitting: Gastroenterology

## 2024-02-18 ENCOUNTER — Ambulatory Visit: Admitting: Gastroenterology

## 2024-02-18 VITALS — BP 110/72 | HR 82 | Ht 62.0 in | Wt 143.2 lb

## 2024-02-18 DIAGNOSIS — R1312 Dysphagia, oropharyngeal phase: Secondary | ICD-10-CM

## 2024-02-18 DIAGNOSIS — K31819 Angiodysplasia of stomach and duodenum without bleeding: Secondary | ICD-10-CM

## 2024-02-18 DIAGNOSIS — K5520 Angiodysplasia of colon without hemorrhage: Secondary | ICD-10-CM | POA: Diagnosis not present

## 2024-02-18 DIAGNOSIS — K648 Other hemorrhoids: Secondary | ICD-10-CM | POA: Diagnosis not present

## 2024-02-18 DIAGNOSIS — D509 Iron deficiency anemia, unspecified: Secondary | ICD-10-CM | POA: Diagnosis not present

## 2024-02-18 DIAGNOSIS — K7460 Unspecified cirrhosis of liver: Secondary | ICD-10-CM | POA: Diagnosis not present

## 2024-02-18 NOTE — Patient Instructions (Signed)
 Continue pantoprazole  40 mg daily.   _______________________________________________________  If your blood pressure at your visit was 140/90 or greater, please contact your primary care physician to follow up on this.  _______________________________________________________  If you are age 77 or older, your body mass index should be between 23-30. Your Body mass index is 26.2 kg/m. If this is out of the aforementioned range listed, please consider follow up with your Primary Care Provider.  If you are age 34 or younger, your body mass index should be between 19-25. Your Body mass index is 26.2 kg/m. If this is out of the aformentioned range listed, please consider follow up with your Primary Care Provider.   ________________________________________________________  The [facility] providers would like to encourage you to use MYCHART to communicate with providers for non-urgent requests or questions.  Due to long hold times on the telephone, sending your provider a message by [facility] may be a faster and more efficient way to get a response.  Please allow 48 business hours for a response.  Please remember that this is for non-urgent requests.  _______________________________________________________  [facility] is using a team-based approach to care.  Your team is made up of your doctor and two to three APPS. Our APPS (Nurse Practitioners and Physician Assistants) work with your physician to ensure care continuity for you. They are fully qualified to address your health concerns and develop a treatment plan. They communicate directly with your gastroenterologist to care for you. Seeing the Advanced Practice Practitioners on your physician's team can help you by facilitating care more promptly, often allowing for earlier appointments, access to diagnostic testing, procedures, and other specialty referrals.

## 2024-02-18 NOTE — Telephone Encounter (Signed)
 error

## 2024-02-19 ENCOUNTER — Inpatient Hospital Stay

## 2024-02-19 DIAGNOSIS — D509 Iron deficiency anemia, unspecified: Secondary | ICD-10-CM | POA: Diagnosis not present

## 2024-02-19 NOTE — Progress Notes (Signed)
 The patient arrived for a PICC line flush and dressing change. She reported no complaints and was stable at the time of [facility] from the [facility].

## 2024-02-19 NOTE — Progress Notes (Signed)
 ____________________________________________________________  Attending physician addendum:  Thank you for sending this case to me. I have reviewed the entire note and agree with the plan.  Excellent comprehensive review on a complex patient.  Amada Jupiter, MD  ____________________________________________________________

## 2024-02-19 NOTE — Patient Instructions (Signed)
 PICC Home Care Guide A peripherally inserted central catheter (PICC) is a form of IV access that allows medicines and IV fluids to be quickly put into the blood and spread throughout the body. The PICC is a long, thin, flexible tube (catheter) that is put into a vein in a person's arm or leg. The catheter ends in a large vein just outside the heart called the superior vena cava (SVC). After the PICC is put in, a chest X-ray may be done to make sure that it is in the right place. A PICC may be placed for different reasons, such as: To give medicines and liquid nutrition. To give IV fluids and blood products. To take blood samples often. If there is trouble placing a peripheral intravenous (PIV) catheter. If cared for properly, a PICC can remain in place for many months. Having a PICC can allow you to go home from the hospital sooner and continue treatment at home. Medicines and PICC care can be managed at home by a family member, caregiver, or home health care team. What are the risks? Generally, having a PICC is safe. However, problems may occur, including: A blood clot (thrombus) forming in or at the end of the PICC. A blood clot forming in a vein (deep vein thrombosis) or traveling to the lung (pulmonary embolism). Inflammation of the vein (phlebitis) in which the PICC is placed. Infection at the insertion site or in the blood. Blood infections from central lines, like PICCs, can be serious and often require a hospital stay. PICC malposition, or PICC movement or poor placement. A break or cut in the PICC. Do not use scissors near the PICC. Nerve or tendon irritation or injury during PICC insertion. How to care for your PICC Please follow the specific guidelines provided by your health care provider. Preventing infection You and any caregivers should wash your hands often with soap and water for at least 20 seconds. Wash hands: Before touching the PICC or the infusion device. Before changing a  bandage (dressing). Do not change the dressing unless you have been taught to do so and have shown you are able to change it safely. Flush the PICC as told. Tell your health care provider right away if the PICC is hard to flush or does not flush. Do not use force to flush the PICC. Use clean and germ-free (sterile) supplies only. Keep the supplies in a dry place. Do not reuse needles, syringes, or any other supplies. Reusing supplies can lead to infection. Keep the PICC dressing dry and secure it with tape if the edges stop sticking to your skin. Check your PICC insertion site every day for signs of infection. Check for: Redness, swelling, or pain. Fluid or blood. Warmth. Pus or a bad smell. Preventing other problems Do not use a syringe that is less than 10 mL to flush the PICC. Do not have your blood pressure checked on the arm in which the PICC is placed. Do not ever pull or tug on the PICC. Keep it secured to your arm with tape or a stretch wrap when not in use. Do not take the PICC out yourself. Only a trained health care provider should remove the PICC. Keep pets and children away from your PICC. How to care for your PICC dressing Keep your PICC dressing clean and dry to prevent infection. Do not take baths, swim, or use a hot tub until your health care provider approves. Ask your health care provider if you can take  showers. You may only be allowed to take sponge baths. When you are allowed to shower: Ask your health care provider to teach you how to wrap the PICC. Cover the PICC with clear plastic wrap and tape to keep it dry while showering. Follow instructions from your health care provider about how to take care of your insertion site and dressing. Make sure you: Wash your hands with soap and water for at least 20 seconds before and after you change your dressing. If soap and water are not available, use hand sanitizer. Change your dressing only if taught to do so by your health care  provider. Your PICC dressing needs to be changed if it becomes loose or wet. Leave stitches (sutures), skin glue, or adhesive strips in place. These skin closures may need to stay in place for 2 weeks or longer. If adhesive strip edges start to loosen and curl up, you may trim the loose edges. Do not remove adhesive strips completely unless your health care provider tells you to do that. Follow these instructions at home: Disposal of supplies Throw away any syringes in a disposal container that is meant for sharp items (sharps container). You can buy a sharps container from a pharmacy, or you can make one by using an empty, hard plastic bottle with a lid. Place any used dressings or infusion bags into a plastic bag. Throw that bag in the trash. General instructions  Always carry your PICC identification card or wear a medical alert bracelet. Keep the tube clamped at all times, unless it is being used. Always carry a smooth-edge clamp with you to clamp the PICC if it breaks. Do not use scissors or sharp objects near the tube. You may bend your arm and move it freely. If your PICC is near or at the bend of your elbow, avoid activity with repeated motion at the elbow. Avoid lifting heavy objects as told by your health care provider. Keep all follow-up visits. This is important. You will need to have your PICC dressing changed at least once a week. Contact a health care provider if: You have pain in your arm, ear, face, or teeth. You have a fever or chills. You have redness, swelling, or pain around the insertion site. You have fluid or blood coming from the insertion site. Your insertion site feels warm to the touch. You have pus or a bad smell coming from the insertion site. Your skin feels hard and raised around the insertion site. Your PICC dressing has gotten wet or is coming off and you have not been taught how to change it. Get help right away if: You have problems with your PICC, such as  your PICC: Was tugged or pulled and has partially come out. Do not  push the PICC back in. Cannot be flushed, is hard to flush, or leaks around the insertion site when it is flushed. Makes a flushing sound when it is flushed. Appears to have a hole or tear. Is accidentally pulled all the way out. If this happens, cover the insertion site with a gauze dressing. Do not throw the PICC away. Your health care provider will need to check it to be sure the entire catheter came out. You feel your heart racing or skipping beats, or you have chest pain. You have shortness of breath or trouble breathing. You have swelling, redness, warmth, or pain in the arm in which the PICC is placed. You have a red streak going up your arm that  starts under the PICC dressing. These symptoms may be an emergency. Get help right away. Call 911. Do not wait to see if the symptoms will go away. Do not drive yourself to the hospital. Summary A peripherally inserted central catheter (PICC) is a long, thin, flexible tube (catheter) that is put into a vein in the arm or leg. If cared for properly, a PICC can remain in place for many months. Having a PICC can allow you to go home from the hospital sooner and continue treatment at home. The PICC is inserted using a germ-free (sterile) technique by a specially trained health care provider. Only a trained health care provider should remove it. Do not have your blood pressure checked on the arm in which your PICC is placed. Always keep your PICC identification card with you. This information is not intended to replace advice given to you by your health care provider. Make sure you discuss any questions you have with your health care provider. Document Revised: 01/30/2021 Document Reviewed: 01/30/2021 Elsevier Patient Education  2024 [employer].

## 2024-02-22 ENCOUNTER — Telehealth: Payer: Self-pay

## 2024-02-22 ENCOUNTER — Telehealth: Payer: Self-pay | Admitting: Podiatry

## 2024-02-22 DIAGNOSIS — N182 Chronic kidney disease, stage 2 (mild): Secondary | ICD-10-CM | POA: Diagnosis not present

## 2024-02-22 DIAGNOSIS — J449 Chronic obstructive pulmonary disease, unspecified: Secondary | ICD-10-CM | POA: Diagnosis not present

## 2024-02-22 DIAGNOSIS — D509 Iron deficiency anemia, unspecified: Secondary | ICD-10-CM | POA: Diagnosis not present

## 2024-02-22 DIAGNOSIS — I2510 Atherosclerotic heart disease of native coronary artery without angina pectoris: Secondary | ICD-10-CM | POA: Diagnosis not present

## 2024-02-22 DIAGNOSIS — I130 Hypertensive heart and chronic kidney disease with heart failure and stage 1 through stage 4 chronic kidney disease, or unspecified chronic kidney disease: Secondary | ICD-10-CM | POA: Diagnosis not present

## 2024-02-22 DIAGNOSIS — I700 Atherosclerosis of aorta: Secondary | ICD-10-CM | POA: Diagnosis not present

## 2024-02-22 DIAGNOSIS — I5032 Chronic diastolic (congestive) heart failure: Secondary | ICD-10-CM | POA: Diagnosis not present

## 2024-02-22 DIAGNOSIS — E1122 Type 2 diabetes mellitus with diabetic chronic kidney disease: Secondary | ICD-10-CM | POA: Diagnosis not present

## 2024-02-22 DIAGNOSIS — D539 Nutritional anemia, unspecified: Secondary | ICD-10-CM | POA: Diagnosis not present

## 2024-02-22 NOTE — Telephone Encounter (Signed)
 Patient paperwork has been faxed, patient is aware

## 2024-02-22 NOTE — Telephone Encounter (Signed)
 Copied from CRM #[id number]. Topic: Clinical - Order For Equipment >> Feb 22, 2024 11:12 AM Viola F wrote: Reason for CRM: Patient called to follow up on diabetic shoes, says paperwork was sent in and she's been waiting for an update. Please call her [phone]/ [phone].

## 2024-02-22 NOTE — Telephone Encounter (Signed)
 Patient called following up on diabetic shoes order. Patient states they have not received it.

## 2024-02-23 ENCOUNTER — Inpatient Hospital Stay

## 2024-02-23 VITALS — BP 127/55 | HR 77 | Temp 97.4°F | Resp 18

## 2024-02-23 DIAGNOSIS — D509 Iron deficiency anemia, unspecified: Secondary | ICD-10-CM | POA: Diagnosis not present

## 2024-02-23 DIAGNOSIS — D649 Anemia, unspecified: Secondary | ICD-10-CM

## 2024-02-23 MED ORDER — ACETAMINOPHEN 325 MG PO TABS
650.0000 mg | ORAL_TABLET | Freq: Once | ORAL | Status: AC
  Administered 2024-02-23: 650 mg via ORAL
  Filled 2024-02-23: qty 2

## 2024-02-23 MED ORDER — SODIUM CHLORIDE 0.9 % IV SOLN
INTRAVENOUS | Status: DC
Start: 2024-02-23 — End: 2024-02-23

## 2024-02-23 MED ORDER — IRON SUCROSE 20 MG/ML IV SOLN
200.0000 mg | Freq: Once | INTRAVENOUS | Status: AC
  Administered 2024-02-23: 200 mg via INTRAVENOUS
  Filled 2024-02-23: qty 10

## 2024-02-23 MED ORDER — HEPARIN SOD (PORK) LOCK FLUSH 100 UNIT/ML IV SOLN
250.0000 [IU] | Freq: Once | INTRAVENOUS | Status: AC | PRN
Start: 2024-02-23 — End: 2024-02-23
  Administered 2024-02-23: 250 [IU]

## 2024-02-23 MED ORDER — SODIUM CHLORIDE 0.9% FLUSH: 3.0000 mL | Freq: Once | INTRAVENOUS | Status: DC | PRN

## 2024-02-23 MED ORDER — DIPHENHYDRAMINE HCL 25 MG PO CAPS
25.0000 mg | ORAL_CAPSULE | Freq: Once | ORAL | Status: AC
  Administered 2024-02-23: 25 mg via ORAL
  Filled 2024-02-23: qty 1

## 2024-02-23 MED ORDER — SODIUM CHLORIDE 0.9% FLUSH
10.0000 mL | Freq: Once | INTRAVENOUS | Status: AC | PRN
  Administered 2024-02-23: 10 mL

## 2024-02-23 NOTE — Patient Instructions (Signed)

## 2024-02-26 ENCOUNTER — Inpatient Hospital Stay: Attending: Oncology

## 2024-02-26 DIAGNOSIS — K921 Melena: Secondary | ICD-10-CM | POA: Diagnosis not present

## 2024-02-26 DIAGNOSIS — Z452 Encounter for adjustment and management of vascular access device: Secondary | ICD-10-CM | POA: Diagnosis not present

## 2024-02-26 DIAGNOSIS — D509 Iron deficiency anemia, unspecified: Secondary | ICD-10-CM | POA: Diagnosis not present

## 2024-02-26 MED ORDER — HEPARIN SOD (PORK) LOCK FLUSH 100 UNIT/ML IV SOLN
250.0000 [IU] | INTRAVENOUS | Status: AC | PRN
  Administered 2024-02-26: 250 [IU]

## 2024-02-26 MED ORDER — SODIUM CHLORIDE 0.9% FLUSH
10.0000 mL | INTRAVENOUS | Status: AC | PRN
  Administered 2024-02-26: 10 mL

## 2024-02-26 NOTE — Patient Instructions (Signed)
 PICC Home Care Guide A peripherally inserted central catheter (PICC) is a form of IV access that allows medicines and IV fluids to be quickly put into the blood and spread throughout the body. The PICC is a long, thin, flexible tube (catheter) that is put into a vein in a person's arm or leg. The catheter ends in a large vein just outside the heart called the superior vena cava (SVC). After the PICC is put in, a chest X-ray may be done to make sure that it is in the right place. A PICC may be placed for different reasons, such as: To give medicines and liquid nutrition. To give IV fluids and blood products. To take blood samples often. If there is trouble placing a peripheral intravenous (PIV) catheter. If cared for properly, a PICC can remain in place for many months. Having a PICC can allow you to go home from the hospital sooner and continue treatment at home. Medicines and PICC care can be managed at home by a family member, caregiver, or home health care team. What are the risks? Generally, having a PICC is safe. However, problems may occur, including: A blood clot (thrombus) forming in or at the end of the PICC. A blood clot forming in a vein (deep vein thrombosis) or traveling to the lung (pulmonary embolism). Inflammation of the vein (phlebitis) in which the PICC is placed. Infection at the insertion site or in the blood. Blood infections from central lines, like PICCs, can be serious and often require a hospital stay. PICC malposition, or PICC movement or poor placement. A break or cut in the PICC. Do not use scissors near the PICC. Nerve or tendon irritation or injury during PICC insertion. How to care for your PICC Please follow the specific guidelines provided by your health care provider. Preventing infection You and any caregivers should wash your hands often with soap and water for at least 20 seconds. Wash hands: Before touching the PICC or the infusion device. Before changing a  bandage (dressing). Do not change the dressing unless you have been taught to do so and have shown you are able to change it safely. Flush the PICC as told. Tell your health care provider right away if the PICC is hard to flush or does not flush. Do not use force to flush the PICC. Use clean and germ-free (sterile) supplies only. Keep the supplies in a dry place. Do not reuse needles, syringes, or any other supplies. Reusing supplies can lead to infection. Keep the PICC dressing dry and secure it with tape if the edges stop sticking to your skin. Check your PICC insertion site every day for signs of infection. Check for: Redness, swelling, or pain. Fluid or blood. Warmth. Pus or a bad smell. Preventing other problems Do not use a syringe that is less than 10 mL to flush the PICC. Do not have your blood pressure checked on the arm in which the PICC is placed. Do not ever pull or tug on the PICC. Keep it secured to your arm with tape or a stretch wrap when not in use. Do not take the PICC out yourself. Only a trained health care provider should remove the PICC. Keep pets and children away from your PICC. How to care for your PICC dressing Keep your PICC dressing clean and dry to prevent infection. Do not take baths, swim, or use a hot tub until your health care provider approves. Ask your health care provider if you can take  showers. You may only be allowed to take sponge baths. When you are allowed to shower: Ask your health care provider to teach you how to wrap the PICC. Cover the PICC with clear plastic wrap and tape to keep it dry while showering. Follow instructions from your health care provider about how to take care of your insertion site and dressing. Make sure you: Wash your hands with soap and water for at least 20 seconds before and after you change your dressing. If soap and water are not available, use hand sanitizer. Change your dressing only if taught to do so by your health care  provider. Your PICC dressing needs to be changed if it becomes loose or wet. Leave stitches (sutures), skin glue, or adhesive strips in place. These skin closures may need to stay in place for 2 weeks or longer. If adhesive strip edges start to loosen and curl up, you may trim the loose edges. Do not remove adhesive strips completely unless your health care provider tells you to do that. Follow these instructions at home: Disposal of supplies Throw away any syringes in a disposal container that is meant for sharp items (sharps container). You can buy a sharps container from a pharmacy, or you can make one by using an empty, hard plastic bottle with a lid. Place any used dressings or infusion bags into a plastic bag. Throw that bag in the trash. General instructions  Always carry your PICC identification card or wear a medical alert bracelet. Keep the tube clamped at all times, unless it is being used. Always carry a smooth-edge clamp with you to clamp the PICC if it breaks. Do not use scissors or sharp objects near the tube. You may bend your arm and move it freely. If your PICC is near or at the bend of your elbow, avoid activity with repeated motion at the elbow. Avoid lifting heavy objects as told by your health care provider. Keep all follow-up visits. This is important. You will need to have your PICC dressing changed at least once a week. Contact a health care provider if: You have pain in your arm, ear, face, or teeth. You have a fever or chills. You have redness, swelling, or pain around the insertion site. You have fluid or blood coming from the insertion site. Your insertion site feels warm to the touch. You have pus or a bad smell coming from the insertion site. Your skin feels hard and raised around the insertion site. Your PICC dressing has gotten wet or is coming off and you have not been taught how to change it. Get help right away if: You have problems with your PICC, such as  your PICC: Was tugged or pulled and has partially come out. Do not  push the PICC back in. Cannot be flushed, is hard to flush, or leaks around the insertion site when it is flushed. Makes a flushing sound when it is flushed. Appears to have a hole or tear. Is accidentally pulled all the way out. If this happens, cover the insertion site with a gauze dressing. Do not throw the PICC away. Your health care provider will need to check it to be sure the entire catheter came out. You feel your heart racing or skipping beats, or you have chest pain. You have shortness of breath or trouble breathing. You have swelling, redness, warmth, or pain in the arm in which the PICC is placed. You have a red streak going up your arm that  starts under the PICC dressing. These symptoms may be an emergency. Get help right away. Call 911. Do not wait to see if the symptoms will go away. Do not drive yourself to the hospital. Summary A peripherally inserted central catheter (PICC) is a long, thin, flexible tube (catheter) that is put into a vein in the arm or leg. If cared for properly, a PICC can remain in place for many months. Having a PICC can allow you to go home from the hospital sooner and continue treatment at home. The PICC is inserted using a germ-free (sterile) technique by a specially trained health care provider. Only a trained health care provider should remove it. Do not have your blood pressure checked on the arm in which your PICC is placed. Always keep your PICC identification card with you. This information is not intended to replace advice given to you by your health care provider. Make sure you discuss any questions you have with your health care provider. Document Revised: 01/30/2021 Document Reviewed: 01/30/2021 Elsevier Patient Education  2024 [employer].

## 2024-03-01 ENCOUNTER — Inpatient Hospital Stay

## 2024-03-01 VITALS — BP 135/58 | HR 85 | Temp 98.5°F | Resp 18

## 2024-03-01 DIAGNOSIS — D509 Iron deficiency anemia, unspecified: Secondary | ICD-10-CM | POA: Diagnosis not present

## 2024-03-01 DIAGNOSIS — K921 Melena: Secondary | ICD-10-CM | POA: Diagnosis not present

## 2024-03-01 DIAGNOSIS — D649 Anemia, unspecified: Secondary | ICD-10-CM

## 2024-03-01 DIAGNOSIS — Z452 Encounter for adjustment and management of vascular access device: Secondary | ICD-10-CM | POA: Diagnosis not present

## 2024-03-01 MED ORDER — IRON SUCROSE 20 MG/ML IV SOLN
200.0000 mg | Freq: Once | INTRAVENOUS | Status: AC
  Administered 2024-03-01: 200 mg via INTRAVENOUS
  Filled 2024-03-01: qty 10

## 2024-03-01 MED ORDER — SODIUM CHLORIDE 0.9 % IV SOLN
INTRAVENOUS | Status: DC
Start: 2024-03-01 — End: 2024-03-01

## 2024-03-01 MED ORDER — ACETAMINOPHEN 325 MG PO TABS
650.0000 mg | ORAL_TABLET | Freq: Once | ORAL | Status: AC
  Administered 2024-03-01: 650 mg via ORAL

## 2024-03-01 MED ORDER — DIPHENHYDRAMINE HCL 25 MG PO CAPS
25.0000 mg | ORAL_CAPSULE | Freq: Once | ORAL | Status: AC
  Administered 2024-03-01: 25 mg via ORAL
  Filled 2024-03-01: qty 1

## 2024-03-01 NOTE — Patient Instructions (Signed)

## 2024-03-02 ENCOUNTER — Telehealth: Payer: Self-pay

## 2024-03-02 ENCOUNTER — Telehealth: Payer: Self-pay | Admitting: Oncology

## 2024-03-02 ENCOUNTER — Other Ambulatory Visit: Payer: Self-pay | Admitting: Family Medicine

## 2024-03-02 ENCOUNTER — Ambulatory Visit

## 2024-03-02 DIAGNOSIS — E1129 Type 2 diabetes mellitus with other diabetic kidney complication: Secondary | ICD-10-CM

## 2024-03-02 DIAGNOSIS — E162 Hypoglycemia, unspecified: Secondary | ICD-10-CM

## 2024-03-02 NOTE — Telephone Encounter (Signed)
 Called to confirm appt day and times with PT

## 2024-03-02 NOTE — Telephone Encounter (Signed)
 Copied from CRM [id number]. Topic: General - Other >> Mar 02, 2024  9:21 AM Pinkey ORN wrote: Reason for CRM: Requesting Office Call Back >> Mar 02, 2024  9:24 AM Pinkey ORN wrote: Patient is requesting a call back from Brien Rea LABOR, CMA. Patient is needing assistance with her DEXACON. Patient call back number is [phone] / [phone]

## 2024-03-02 NOTE — Progress Notes (Signed)
 Patient presents for a nurses visit to remove and replace the Dexcom G7 sensor.

## 2024-03-02 NOTE — Telephone Encounter (Signed)
 Spoke with patient, patient sch to come in 8/6

## 2024-03-03 DIAGNOSIS — I2510 Atherosclerotic heart disease of native coronary artery without angina pectoris: Secondary | ICD-10-CM | POA: Diagnosis not present

## 2024-03-03 DIAGNOSIS — I130 Hypertensive heart and chronic kidney disease with heart failure and stage 1 through stage 4 chronic kidney disease, or unspecified chronic kidney disease: Secondary | ICD-10-CM | POA: Diagnosis not present

## 2024-03-03 DIAGNOSIS — E1122 Type 2 diabetes mellitus with diabetic chronic kidney disease: Secondary | ICD-10-CM | POA: Diagnosis not present

## 2024-03-03 DIAGNOSIS — D539 Nutritional anemia, unspecified: Secondary | ICD-10-CM | POA: Diagnosis not present

## 2024-03-03 DIAGNOSIS — I700 Atherosclerosis of aorta: Secondary | ICD-10-CM | POA: Diagnosis not present

## 2024-03-03 DIAGNOSIS — I5032 Chronic diastolic (congestive) heart failure: Secondary | ICD-10-CM | POA: Diagnosis not present

## 2024-03-03 DIAGNOSIS — N182 Chronic kidney disease, stage 2 (mild): Secondary | ICD-10-CM | POA: Diagnosis not present

## 2024-03-03 DIAGNOSIS — J449 Chronic obstructive pulmonary disease, unspecified: Secondary | ICD-10-CM | POA: Diagnosis not present

## 2024-03-03 DIAGNOSIS — D509 Iron deficiency anemia, unspecified: Secondary | ICD-10-CM | POA: Diagnosis not present

## 2024-03-04 ENCOUNTER — Inpatient Hospital Stay

## 2024-03-04 DIAGNOSIS — D509 Iron deficiency anemia, unspecified: Secondary | ICD-10-CM | POA: Diagnosis not present

## 2024-03-04 DIAGNOSIS — Z452 Encounter for adjustment and management of vascular access device: Secondary | ICD-10-CM | POA: Diagnosis not present

## 2024-03-04 DIAGNOSIS — K921 Melena: Secondary | ICD-10-CM | POA: Diagnosis not present

## 2024-03-04 NOTE — Patient Instructions (Signed)
 PICC Home Care Guide A peripherally inserted central catheter (PICC) is a form of IV access that allows medicines and IV fluids to be quickly put into the blood and spread throughout the body. The PICC is a long, thin, flexible tube (catheter) that is put into a vein in a person's arm or leg. The catheter ends in a large vein just outside the heart called the superior vena cava (SVC). After the PICC is put in, a chest X-ray may be done to make sure that it is in the right place. A PICC may be placed for different reasons, such as: To give medicines and liquid nutrition. To give IV fluids and blood products. To take blood samples often. If there is trouble placing a peripheral intravenous (PIV) catheter. If cared for properly, a PICC can remain in place for many months. Having a PICC can allow you to go home from the hospital sooner and continue treatment at home. Medicines and PICC care can be managed at home by a family member, caregiver, or home health care team. What are the risks? Generally, having a PICC is safe. However, problems may occur, including: A blood clot (thrombus) forming in or at the end of the PICC. A blood clot forming in a vein (deep vein thrombosis) or traveling to the lung (pulmonary embolism). Inflammation of the vein (phlebitis) in which the PICC is placed. Infection at the insertion site or in the blood. Blood infections from central lines, like PICCs, can be serious and often require a hospital stay. PICC malposition, or PICC movement or poor placement. A break or cut in the PICC. Do not use scissors near the PICC. Nerve or tendon irritation or injury during PICC insertion. How to care for your PICC Please follow the specific guidelines provided by your health care provider. Preventing infection You and any caregivers should wash your hands often with soap and water for at least 20 seconds. Wash hands: Before touching the PICC or the infusion device. Before changing a  bandage (dressing). Do not change the dressing unless you have been taught to do so and have shown you are able to change it safely. Flush the PICC as told. Tell your health care provider right away if the PICC is hard to flush or does not flush. Do not use force to flush the PICC. Use clean and germ-free (sterile) supplies only. Keep the supplies in a dry place. Do not reuse needles, syringes, or any other supplies. Reusing supplies can lead to infection. Keep the PICC dressing dry and secure it with tape if the edges stop sticking to your skin. Check your PICC insertion site every day for signs of infection. Check for: Redness, swelling, or pain. Fluid or blood. Warmth. Pus or a bad smell. Preventing other problems Do not use a syringe that is less than 10 mL to flush the PICC. Do not have your blood pressure checked on the arm in which the PICC is placed. Do not ever pull or tug on the PICC. Keep it secured to your arm with tape or a stretch wrap when not in use. Do not take the PICC out yourself. Only a trained health care provider should remove the PICC. Keep pets and children away from your PICC. How to care for your PICC dressing Keep your PICC dressing clean and dry to prevent infection. Do not take baths, swim, or use a hot tub until your health care provider approves. Ask your health care provider if you can take  showers. You may only be allowed to take sponge baths. When you are allowed to shower: Ask your health care provider to teach you how to wrap the PICC. Cover the PICC with clear plastic wrap and tape to keep it dry while showering. Follow instructions from your health care provider about how to take care of your insertion site and dressing. Make sure you: Wash your hands with soap and water for at least 20 seconds before and after you change your dressing. If soap and water are not available, use hand sanitizer. Change your dressing only if taught to do so by your health care  provider. Your PICC dressing needs to be changed if it becomes loose or wet. Leave stitches (sutures), skin glue, or adhesive strips in place. These skin closures may need to stay in place for 2 weeks or longer. If adhesive strip edges start to loosen and curl up, you may trim the loose edges. Do not remove adhesive strips completely unless your health care provider tells you to do that. Follow these instructions at home: Disposal of supplies Throw away any syringes in a disposal container that is meant for sharp items (sharps container). You can buy a sharps container from a pharmacy, or you can make one by using an empty, hard plastic bottle with a lid. Place any used dressings or infusion bags into a plastic bag. Throw that bag in the trash. General instructions  Always carry your PICC identification card or wear a medical alert bracelet. Keep the tube clamped at all times, unless it is being used. Always carry a smooth-edge clamp with you to clamp the PICC if it breaks. Do not use scissors or sharp objects near the tube. You may bend your arm and move it freely. If your PICC is near or at the bend of your elbow, avoid activity with repeated motion at the elbow. Avoid lifting heavy objects as told by your health care provider. Keep all follow-up visits. This is important. You will need to have your PICC dressing changed at least once a week. Contact a health care provider if: You have pain in your arm, ear, face, or teeth. You have a fever or chills. You have redness, swelling, or pain around the insertion site. You have fluid or blood coming from the insertion site. Your insertion site feels warm to the touch. You have pus or a bad smell coming from the insertion site. Your skin feels hard and raised around the insertion site. Your PICC dressing has gotten wet or is coming off and you have not been taught how to change it. Get help right away if: You have problems with your PICC, such as  your PICC: Was tugged or pulled and has partially come out. Do not  push the PICC back in. Cannot be flushed, is hard to flush, or leaks around the insertion site when it is flushed. Makes a flushing sound when it is flushed. Appears to have a hole or tear. Is accidentally pulled all the way out. If this happens, cover the insertion site with a gauze dressing. Do not throw the PICC away. Your health care provider will need to check it to be sure the entire catheter came out. You feel your heart racing or skipping beats, or you have chest pain. You have shortness of breath or trouble breathing. You have swelling, redness, warmth, or pain in the arm in which the PICC is placed. You have a red streak going up your arm that  starts under the PICC dressing. These symptoms may be an emergency. Get help right away. Call 911. Do not wait to see if the symptoms will go away. Do not drive yourself to the hospital. Summary A peripherally inserted central catheter (PICC) is a long, thin, flexible tube (catheter) that is put into a vein in the arm or leg. If cared for properly, a PICC can remain in place for many months. Having a PICC can allow you to go home from the hospital sooner and continue treatment at home. The PICC is inserted using a germ-free (sterile) technique by a specially trained health care provider. Only a trained health care provider should remove it. Do not have your blood pressure checked on the arm in which your PICC is placed. Always keep your PICC identification card with you. This information is not intended to replace advice given to you by your health care provider. Make sure you discuss any questions you have with your health care provider. Document Revised: 01/30/2021 Document Reviewed: 01/30/2021 Elsevier Patient Education  2024 [employer].

## 2024-03-08 ENCOUNTER — Inpatient Hospital Stay

## 2024-03-08 DIAGNOSIS — Z452 Encounter for adjustment and management of vascular access device: Secondary | ICD-10-CM | POA: Diagnosis not present

## 2024-03-08 DIAGNOSIS — D509 Iron deficiency anemia, unspecified: Secondary | ICD-10-CM | POA: Diagnosis not present

## 2024-03-08 DIAGNOSIS — K921 Melena: Secondary | ICD-10-CM | POA: Diagnosis not present

## 2024-03-08 NOTE — Patient Instructions (Signed)
 PICC Home Care Guide A peripherally inserted central catheter (PICC) is a form of IV access that allows medicines and IV fluids to be quickly put into the blood and spread throughout the body. The PICC is a long, thin, flexible tube (catheter) that is put into a vein in a person's arm or leg. The catheter ends in a large vein just outside the heart called the superior vena cava (SVC). After the PICC is put in, a chest X-ray may be done to make sure that it is in the right place. A PICC may be placed for different reasons, such as: To give medicines and liquid nutrition. To give IV fluids and blood products. To take blood samples often. If there is trouble placing a peripheral intravenous (PIV) catheter. If cared for properly, a PICC can remain in place for many months. Having a PICC can allow you to go home from the hospital sooner and continue treatment at home. Medicines and PICC care can be managed at home by a family member, caregiver, or home health care team. What are the risks? Generally, having a PICC is safe. However, problems may occur, including: A blood clot (thrombus) forming in or at the end of the PICC. A blood clot forming in a vein (deep vein thrombosis) or traveling to the lung (pulmonary embolism). Inflammation of the vein (phlebitis) in which the PICC is placed. Infection at the insertion site or in the blood. Blood infections from central lines, like PICCs, can be serious and often require a hospital stay. PICC malposition, or PICC movement or poor placement. A break or cut in the PICC. Do not use scissors near the PICC. Nerve or tendon irritation or injury during PICC insertion. How to care for your PICC Please follow the specific guidelines provided by your health care provider. Preventing infection You and any caregivers should wash your hands often with soap and water for at least 20 seconds. Wash hands: Before touching the PICC or the infusion device. Before changing a  bandage (dressing). Do not change the dressing unless you have been taught to do so and have shown you are able to change it safely. Flush the PICC as told. Tell your health care provider right away if the PICC is hard to flush or does not flush. Do not use force to flush the PICC. Use clean and germ-free (sterile) supplies only. Keep the supplies in a dry place. Do not reuse needles, syringes, or any other supplies. Reusing supplies can lead to infection. Keep the PICC dressing dry and secure it with tape if the edges stop sticking to your skin. Check your PICC insertion site every day for signs of infection. Check for: Redness, swelling, or pain. Fluid or blood. Warmth. Pus or a bad smell. Preventing other problems Do not use a syringe that is less than 10 mL to flush the PICC. Do not have your blood pressure checked on the arm in which the PICC is placed. Do not ever pull or tug on the PICC. Keep it secured to your arm with tape or a stretch wrap when not in use. Do not take the PICC out yourself. Only a trained health care provider should remove the PICC. Keep pets and children away from your PICC. How to care for your PICC dressing Keep your PICC dressing clean and dry to prevent infection. Do not take baths, swim, or use a hot tub until your health care provider approves. Ask your health care provider if you can take  showers. You may only be allowed to take sponge baths. When you are allowed to shower: Ask your health care provider to teach you how to wrap the PICC. Cover the PICC with clear plastic wrap and tape to keep it dry while showering. Follow instructions from your health care provider about how to take care of your insertion site and dressing. Make sure you: Wash your hands with soap and water for at least 20 seconds before and after you change your dressing. If soap and water are not available, use hand sanitizer. Change your dressing only if taught to do so by your health care  provider. Your PICC dressing needs to be changed if it becomes loose or wet. Leave stitches (sutures), skin glue, or adhesive strips in place. These skin closures may need to stay in place for 2 weeks or longer. If adhesive strip edges start to loosen and curl up, you may trim the loose edges. Do not remove adhesive strips completely unless your health care provider tells you to do that. Follow these instructions at home: Disposal of supplies Throw away any syringes in a disposal container that is meant for sharp items (sharps container). You can buy a sharps container from a pharmacy, or you can make one by using an empty, hard plastic bottle with a lid. Place any used dressings or infusion bags into a plastic bag. Throw that bag in the trash. General instructions  Always carry your PICC identification card or wear a medical alert bracelet. Keep the tube clamped at all times, unless it is being used. Always carry a smooth-edge clamp with you to clamp the PICC if it breaks. Do not use scissors or sharp objects near the tube. You may bend your arm and move it freely. If your PICC is near or at the bend of your elbow, avoid activity with repeated motion at the elbow. Avoid lifting heavy objects as told by your health care provider. Keep all follow-up visits. This is important. You will need to have your PICC dressing changed at least once a week. Contact a health care provider if: You have pain in your arm, ear, face, or teeth. You have a fever or chills. You have redness, swelling, or pain around the insertion site. You have fluid or blood coming from the insertion site. Your insertion site feels warm to the touch. You have pus or a bad smell coming from the insertion site. Your skin feels hard and raised around the insertion site. Your PICC dressing has gotten wet or is coming off and you have not been taught how to change it. Get help right away if: You have problems with your PICC, such as  your PICC: Was tugged or pulled and has partially come out. Do not  push the PICC back in. Cannot be flushed, is hard to flush, or leaks around the insertion site when it is flushed. Makes a flushing sound when it is flushed. Appears to have a hole or tear. Is accidentally pulled all the way out. If this happens, cover the insertion site with a gauze dressing. Do not throw the PICC away. Your health care provider will need to check it to be sure the entire catheter came out. You feel your heart racing or skipping beats, or you have chest pain. You have shortness of breath or trouble breathing. You have swelling, redness, warmth, or pain in the arm in which the PICC is placed. You have a red streak going up your arm that  starts under the PICC dressing. These symptoms may be an emergency. Get help right away. Call 911. Do not wait to see if the symptoms will go away. Do not drive yourself to the hospital. Summary A peripherally inserted central catheter (PICC) is a long, thin, flexible tube (catheter) that is put into a vein in the arm or leg. If cared for properly, a PICC can remain in place for many months. Having a PICC can allow you to go home from the hospital sooner and continue treatment at home. The PICC is inserted using a germ-free (sterile) technique by a specially trained health care provider. Only a trained health care provider should remove it. Do not have your blood pressure checked on the arm in which your PICC is placed. Always keep your PICC identification card with you. This information is not intended to replace advice given to you by your health care provider. Make sure you discuss any questions you have with your health care provider. Document Revised: 01/30/2021 Document Reviewed: 01/30/2021 Elsevier Patient Education  2024 [employer].

## 2024-03-09 ENCOUNTER — Other Ambulatory Visit (INDEPENDENT_AMBULATORY_CARE_PROVIDER_SITE_OTHER)

## 2024-03-09 DIAGNOSIS — E1129 Type 2 diabetes mellitus with other diabetic kidney complication: Secondary | ICD-10-CM

## 2024-03-10 NOTE — Progress Notes (Signed)
 03/10/2024 Name: Anita Mccormick MRN: [id number] DOB: October 15, 1945  No chief complaint on file.   Anita Mccormick is a 78 y.o. year old female who presented for a telephone visit.   They were referred to the pharmacist by their PCP for assistance in managing medication access and complex medication management.    Subjective:  Care Team: Primary Care Provider: Mercer Clotilda SAUNDERS, MD ; Next Scheduled Visit: 05/18/24  Medication Access/Adherence  Current Pharmacy:  [id number] [place],  - [address] AT [facility] [address] [place] [address] Phone: [phone] Fax: [phone]  [facility] [place] [facility] [place], [place] - [address] [place] [address] Phone: [phone] Fax: [phone]  [facility] [address] [place] [address] Phone: [phone] Fax: [phone]  [facility] - [place], [place] - [address] [place] [address] Phone: [phone] Fax: [phone]  [id number] - [address], [place] - [address] AT [address] [place] [address] Phone: [phone] Fax: [phone]   Patient reports affordability concerns with their medications: Yes - Dexcom Sensors Patient reports access/transportation concerns to their pharmacy: No  Patient reports adherence concerns with their medications:  No  Diabetes:  Current medications: Trulicity  1.5mg  once weekly, Glipizide  Medications tried in the past: Januvia , Metformin   Current glucose readings: denies any high BG, reports some alarms that BG is below 80 but just one reading below 70 of 68, she corrected with some PB crackers  Observed patterns:  Patient denies hypoglycemic s/sx including dizziness, shakiness, sweating. Patient denies hyperglycemic symptoms including polyuria,  polydipsia, polyphagia, nocturia, neuropathy, blurred vision.   Current medication access support: Trulicity  through [employer]   Objective:  Lab Results  Component Value Date   HGBA1C 6.1 (H) 12/24/2023    Lab Results  Component Value Date   CREATININE 1.09 (H) 01/25/2024   BUN 13 01/25/2024   NA 144 01/25/2024   K 3.9 01/25/2024   CL 109 01/25/2024   CO2 26 01/25/2024    Lab Results  Component Value Date   CHOL 103 12/07/2023   HDL 66 12/07/2023   LDLCALC 24 12/07/2023   LDLDIRECT 95.8 07/02/2006   TRIG 53 12/07/2023   CHOLHDL 1.6 12/07/2023    Medications Reviewed Today     Reviewed by Lionell Jon DEL, RPH (Pharmacist) on 03/10/24 at 0816  Med List Status: <None>   Medication Order Taking? Sig Documenting Provider Last Dose Status Informant  ACCU-CHEK GUIDE TEST test strip [id number]  1 each by Other route 3 (three) times daily. [provider]  Active   acetaminophen  (TYLENOL ) 325 MG tablet [id number] Yes Take 2 tablets (650 mg total) by mouth every 6 (six) hours as needed for mild pain (pain score 1-3) (or Fever >/= 101). Verdene Purchase, MD  Active Self, Pharmacy Records  albuterol  (VENTOLIN  HFA) 108 [phone] Base) MCG/ACT inhaler [id number]  Inhale 2 puffs into the lungs every 6 (six) hours as needed for wheezing or shortness of breath. Gonfa, Taye T, MD  Active Self, Pharmacy Records           Med Note LEOBARDO, NICOLE   Thu Dec 24, 2023 12:26 AM) Rescue inhaler  aspirin  EC 81 MG tablet [id number] Yes Take 81 mg by mouth daily. Swallow whole. [provider]  Active Self, Pharmacy Records  atorvastatin  (  LIPITOR ) 80 MG tablet [id number] Yes Take 1 tablet (80 mg total) by mouth daily. Mercer Clotilda SAUNDERS, MD  Active Self, Pharmacy Records  Blood Glucose Monitoring Suppl (ACCU-CHEK GUIDE ME) w/Device KIT [id number]  Inject 1 Units into the skin 3 (three) times daily. [provider]  Active   Budeson-Glycopyrrol-Formoterol  DORY AEROSPHERE IN)  [id number] Yes Inhale 2 puffs into the lungs 2 (two) times daily. [provider]  Active Self, Pharmacy Records           Med Note JANEAN, [facility]   Thu Mar 10, 2024  8:15 AM)    clopidogrel  (PLAVIX ) 75 MG tablet [id number] Yes Take 1 tablet (75 mg total) by mouth daily. Mercer Clotilda SAUNDERS, MD  Active Self, Pharmacy Records  Continuous Glucose Sensor [facility] G7 SENSOR) [place] [id number] Yes APPLY ONE SENSOR EVERY 21 DAYS Mercer Clotilda SAUNDERS, MD  Active   Dulaglutide  (TRULICITY ) 1.5 MG/0.5ML EMMANUEL [id number] Yes Inject 1.5 mg into the skin once a week. [provider]  Active Self, Pharmacy Records           Med Note LEOBARDO NAT Schaumann Dec 24, 2023 12:29 AM) April 2025  ferrous gluconate  [facility]) 324 MG tablet [id number] Yes Take 1 tablet (324 mg total) by mouth every other day. Arlon Carliss ORN, DO  Active   glipiZIDE  (GLUCOTROL  XL) 10 MG 24 hr tablet [id number] Yes TAKE 1 TABLET TWICE DAILY Mercer Clotilda SAUNDERS, MD  Active Self, Pharmacy Records  glucose 4 GM chewable tablet [id number] Yes Chew 1 tablet by mouth as needed for low blood sugar. [provider]  Active Self, Pharmacy Records  metoprolol  succinate (TOPROL -XL) 25 MG 24 hr tablet [id number] Yes Take 1 tablet (25 mg total) by mouth daily. Mercer Clotilda SAUNDERS, MD  Active Self, Pharmacy Records  Multiple Vitamin (MULTIVITAMIN) tablet [id number] Yes Take 1 tablet by mouth daily. [provider]  Active Self, Pharmacy Records  nitroGLYCERIN  (NITROSTAT ) 0.4 MG SL tablet [id number] Yes Place 1 tablet (0.4 mg total) under the tongue every 5 (five) minutes as needed for chest pain. Mercer Clotilda SAUNDERS, MD  Active Self, Pharmacy Records  pantoprazole  (PROTONIX ) 40 MG tablet [id number]  Take 1 tablet (40 mg total) by mouth daily.  Patient taking differently: Take 40 mg by mouth 2 (two) times daily.   Mercer Clotilda SAUNDERS, MD  Active Self, Pharmacy Records              Assessment/Plan:   Diabetes: - Currently uncontrolled -  Reviewed long term cardiovascular and renal outcomes of uncontrolled blood sugar - Reviewed goal A1c, goal fasting, and goal 2 hour post prandial glucose - Reviewed dietary modifications including low carb diet - Continue current medication therapy - Patient denies personal or family history of multiple endocrine neoplasia type 2, medullary thyroid cancer; personal history of pancreatitis or gallbladder disease. - Recommend to check glucose continuously    Follow Up Plan: PCP in Oct, reach out after to schedule as indicated  Jon VEAR Lindau, PharmD Clinical Pharmacist [phone]

## 2024-03-11 ENCOUNTER — Inpatient Hospital Stay

## 2024-03-11 DIAGNOSIS — Z452 Encounter for adjustment and management of vascular access device: Secondary | ICD-10-CM | POA: Diagnosis not present

## 2024-03-11 DIAGNOSIS — D539 Nutritional anemia, unspecified: Secondary | ICD-10-CM | POA: Diagnosis not present

## 2024-03-11 DIAGNOSIS — I5032 Chronic diastolic (congestive) heart failure: Secondary | ICD-10-CM | POA: Diagnosis not present

## 2024-03-11 DIAGNOSIS — K921 Melena: Secondary | ICD-10-CM | POA: Diagnosis not present

## 2024-03-11 DIAGNOSIS — I2510 Atherosclerotic heart disease of native coronary artery without angina pectoris: Secondary | ICD-10-CM | POA: Diagnosis not present

## 2024-03-11 DIAGNOSIS — D509 Iron deficiency anemia, unspecified: Secondary | ICD-10-CM | POA: Diagnosis not present

## 2024-03-11 DIAGNOSIS — J449 Chronic obstructive pulmonary disease, unspecified: Secondary | ICD-10-CM | POA: Diagnosis not present

## 2024-03-11 DIAGNOSIS — E1122 Type 2 diabetes mellitus with diabetic chronic kidney disease: Secondary | ICD-10-CM | POA: Diagnosis not present

## 2024-03-11 DIAGNOSIS — I130 Hypertensive heart and chronic kidney disease with heart failure and stage 1 through stage 4 chronic kidney disease, or unspecified chronic kidney disease: Secondary | ICD-10-CM | POA: Diagnosis not present

## 2024-03-11 DIAGNOSIS — N182 Chronic kidney disease, stage 2 (mild): Secondary | ICD-10-CM | POA: Diagnosis not present

## 2024-03-11 DIAGNOSIS — I700 Atherosclerosis of aorta: Secondary | ICD-10-CM | POA: Diagnosis not present

## 2024-03-11 NOTE — Patient Instructions (Signed)
 PICC Home Care Guide A peripherally inserted central catheter (PICC) is a form of IV access that allows medicines and IV fluids to be quickly put into the blood and spread throughout the body. The PICC is a long, thin, flexible tube (catheter) that is put into a vein in a person's arm or leg. The catheter ends in a large vein just outside the heart called the superior vena cava (SVC). After the PICC is put in, a chest X-ray may be done to make sure that it is in the right place. A PICC may be placed for different reasons, such as: To give medicines and liquid nutrition. To give IV fluids and blood products. To take blood samples often. If there is trouble placing a peripheral intravenous (PIV) catheter. If cared for properly, a PICC can remain in place for many months. Having a PICC can allow you to go home from the hospital sooner and continue treatment at home. Medicines and PICC care can be managed at home by a family member, caregiver, or home health care team. What are the risks? Generally, having a PICC is safe. However, problems may occur, including: A blood clot (thrombus) forming in or at the end of the PICC. A blood clot forming in a vein (deep vein thrombosis) or traveling to the lung (pulmonary embolism). Inflammation of the vein (phlebitis) in which the PICC is placed. Infection at the insertion site or in the blood. Blood infections from central lines, like PICCs, can be serious and often require a hospital stay. PICC malposition, or PICC movement or poor placement. A break or cut in the PICC. Do not use scissors near the PICC. Nerve or tendon irritation or injury during PICC insertion. How to care for your PICC Please follow the specific guidelines provided by your health care provider. Preventing infection You and any caregivers should wash your hands often with soap and water for at least 20 seconds. Wash hands: Before touching the PICC or the infusion device. Before changing a  bandage (dressing). Do not change the dressing unless you have been taught to do so and have shown you are able to change it safely. Flush the PICC as told. Tell your health care provider right away if the PICC is hard to flush or does not flush. Do not use force to flush the PICC. Use clean and germ-free (sterile) supplies only. Keep the supplies in a dry place. Do not reuse needles, syringes, or any other supplies. Reusing supplies can lead to infection. Keep the PICC dressing dry and secure it with tape if the edges stop sticking to your skin. Check your PICC insertion site every day for signs of infection. Check for: Redness, swelling, or pain. Fluid or blood. Warmth. Pus or a bad smell. Preventing other problems Do not use a syringe that is less than 10 mL to flush the PICC. Do not have your blood pressure checked on the arm in which the PICC is placed. Do not ever pull or tug on the PICC. Keep it secured to your arm with tape or a stretch wrap when not in use. Do not take the PICC out yourself. Only a trained health care provider should remove the PICC. Keep pets and children away from your PICC. How to care for your PICC dressing Keep your PICC dressing clean and dry to prevent infection. Do not take baths, swim, or use a hot tub until your health care provider approves. Ask your health care provider if you can take  showers. You may only be allowed to take sponge baths. When you are allowed to shower: Ask your health care provider to teach you how to wrap the PICC. Cover the PICC with clear plastic wrap and tape to keep it dry while showering. Follow instructions from your health care provider about how to take care of your insertion site and dressing. Make sure you: Wash your hands with soap and water for at least 20 seconds before and after you change your dressing. If soap and water are not available, use hand sanitizer. Change your dressing only if taught to do so by your health care  provider. Your PICC dressing needs to be changed if it becomes loose or wet. Leave stitches (sutures), skin glue, or adhesive strips in place. These skin closures may need to stay in place for 2 weeks or longer. If adhesive strip edges start to loosen and curl up, you may trim the loose edges. Do not remove adhesive strips completely unless your health care provider tells you to do that. Follow these instructions at home: Disposal of supplies Throw away any syringes in a disposal container that is meant for sharp items (sharps container). You can buy a sharps container from a pharmacy, or you can make one by using an empty, hard plastic bottle with a lid. Place any used dressings or infusion bags into a plastic bag. Throw that bag in the trash. General instructions  Always carry your PICC identification card or wear a medical alert bracelet. Keep the tube clamped at all times, unless it is being used. Always carry a smooth-edge clamp with you to clamp the PICC if it breaks. Do not use scissors or sharp objects near the tube. You may bend your arm and move it freely. If your PICC is near or at the bend of your elbow, avoid activity with repeated motion at the elbow. Avoid lifting heavy objects as told by your health care provider. Keep all follow-up visits. This is important. You will need to have your PICC dressing changed at least once a week. Contact a health care provider if: You have pain in your arm, ear, face, or teeth. You have a fever or chills. You have redness, swelling, or pain around the insertion site. You have fluid or blood coming from the insertion site. Your insertion site feels warm to the touch. You have pus or a bad smell coming from the insertion site. Your skin feels hard and raised around the insertion site. Your PICC dressing has gotten wet or is coming off and you have not been taught how to change it. Get help right away if: You have problems with your PICC, such as  your PICC: Was tugged or pulled and has partially come out. Do not  push the PICC back in. Cannot be flushed, is hard to flush, or leaks around the insertion site when it is flushed. Makes a flushing sound when it is flushed. Appears to have a hole or tear. Is accidentally pulled all the way out. If this happens, cover the insertion site with a gauze dressing. Do not throw the PICC away. Your health care provider will need to check it to be sure the entire catheter came out. You feel your heart racing or skipping beats, or you have chest pain. You have shortness of breath or trouble breathing. You have swelling, redness, warmth, or pain in the arm in which the PICC is placed. You have a red streak going up your arm that  starts under the PICC dressing. These symptoms may be an emergency. Get help right away. Call 911. Do not wait to see if the symptoms will go away. Do not drive yourself to the hospital. Summary A peripherally inserted central catheter (PICC) is a long, thin, flexible tube (catheter) that is put into a vein in the arm or leg. If cared for properly, a PICC can remain in place for many months. Having a PICC can allow you to go home from the hospital sooner and continue treatment at home. The PICC is inserted using a germ-free (sterile) technique by a specially trained health care provider. Only a trained health care provider should remove it. Do not have your blood pressure checked on the arm in which your PICC is placed. Always keep your PICC identification card with you. This information is not intended to replace advice given to you by your health care provider. Make sure you discuss any questions you have with your health care provider. Document Revised: 01/30/2021 Document Reviewed: 01/30/2021 Elsevier Patient Education  2024 [employer].

## 2024-03-14 ENCOUNTER — Other Ambulatory Visit: Payer: Self-pay | Admitting: Oncology

## 2024-03-14 DIAGNOSIS — D649 Anemia, unspecified: Secondary | ICD-10-CM

## 2024-03-14 NOTE — Addendum Note (Signed)
 Encounter addended by: Janice Lynwood BROCKS on: 03/14/2024 11:10 AM  Actions taken: Imaging Exam ended

## 2024-03-15 ENCOUNTER — Inpatient Hospital Stay

## 2024-03-15 DIAGNOSIS — D509 Iron deficiency anemia, unspecified: Secondary | ICD-10-CM | POA: Diagnosis not present

## 2024-03-15 DIAGNOSIS — K921 Melena: Secondary | ICD-10-CM | POA: Diagnosis not present

## 2024-03-15 DIAGNOSIS — Z452 Encounter for adjustment and management of vascular access device: Secondary | ICD-10-CM | POA: Diagnosis not present

## 2024-03-15 NOTE — Patient Instructions (Signed)
 PICC Home Care Guide A peripherally inserted central catheter (PICC) is a form of IV access that allows medicines and IV fluids to be quickly put into the blood and spread throughout the body. The PICC is a long, thin, flexible tube (catheter) that is put into a vein in a person's arm or leg. The catheter ends in a large vein just outside the heart called the superior vena cava (SVC). After the PICC is put in, a chest X-ray may be done to make sure that it is in the right place. A PICC may be placed for different reasons, such as: To give medicines and liquid nutrition. To give IV fluids and blood products. To take blood samples often. If there is trouble placing a peripheral intravenous (PIV) catheter. If cared for properly, a PICC can remain in place for many months. Having a PICC can allow you to go home from the hospital sooner and continue treatment at home. Medicines and PICC care can be managed at home by a family member, caregiver, or home health care team. What are the risks? Generally, having a PICC is safe. However, problems may occur, including: A blood clot (thrombus) forming in or at the end of the PICC. A blood clot forming in a vein (deep vein thrombosis) or traveling to the lung (pulmonary embolism). Inflammation of the vein (phlebitis) in which the PICC is placed. Infection at the insertion site or in the blood. Blood infections from central lines, like PICCs, can be serious and often require a hospital stay. PICC malposition, or PICC movement or poor placement. A break or cut in the PICC. Do not use scissors near the PICC. Nerve or tendon irritation or injury during PICC insertion. How to care for your PICC Please follow the specific guidelines provided by your health care provider. Preventing infection You and any caregivers should wash your hands often with soap and water for at least 20 seconds. Wash hands: Before touching the PICC or the infusion device. Before changing a  bandage (dressing). Do not change the dressing unless you have been taught to do so and have shown you are able to change it safely. Flush the PICC as told. Tell your health care provider right away if the PICC is hard to flush or does not flush. Do not use force to flush the PICC. Use clean and germ-free (sterile) supplies only. Keep the supplies in a dry place. Do not reuse needles, syringes, or any other supplies. Reusing supplies can lead to infection. Keep the PICC dressing dry and secure it with tape if the edges stop sticking to your skin. Check your PICC insertion site every day for signs of infection. Check for: Redness, swelling, or pain. Fluid or blood. Warmth. Pus or a bad smell. Preventing other problems Do not use a syringe that is less than 10 mL to flush the PICC. Do not have your blood pressure checked on the arm in which the PICC is placed. Do not ever pull or tug on the PICC. Keep it secured to your arm with tape or a stretch wrap when not in use. Do not take the PICC out yourself. Only a trained health care provider should remove the PICC. Keep pets and children away from your PICC. How to care for your PICC dressing Keep your PICC dressing clean and dry to prevent infection. Do not take baths, swim, or use a hot tub until your health care provider approves. Ask your health care provider if you can take  showers. You may only be allowed to take sponge baths. When you are allowed to shower: Ask your health care provider to teach you how to wrap the PICC. Cover the PICC with clear plastic wrap and tape to keep it dry while showering. Follow instructions from your health care provider about how to take care of your insertion site and dressing. Make sure you: Wash your hands with soap and water for at least 20 seconds before and after you change your dressing. If soap and water are not available, use hand sanitizer. Change your dressing only if taught to do so by your health care  provider. Your PICC dressing needs to be changed if it becomes loose or wet. Leave stitches (sutures), skin glue, or adhesive strips in place. These skin closures may need to stay in place for 2 weeks or longer. If adhesive strip edges start to loosen and curl up, you may trim the loose edges. Do not remove adhesive strips completely unless your health care provider tells you to do that. Follow these instructions at home: Disposal of supplies Throw away any syringes in a disposal container that is meant for sharp items (sharps container). You can buy a sharps container from a pharmacy, or you can make one by using an empty, hard plastic bottle with a lid. Place any used dressings or infusion bags into a plastic bag. Throw that bag in the trash. General instructions  Always carry your PICC identification card or wear a medical alert bracelet. Keep the tube clamped at all times, unless it is being used. Always carry a smooth-edge clamp with you to clamp the PICC if it breaks. Do not use scissors or sharp objects near the tube. You may bend your arm and move it freely. If your PICC is near or at the bend of your elbow, avoid activity with repeated motion at the elbow. Avoid lifting heavy objects as told by your health care provider. Keep all follow-up visits. This is important. You will need to have your PICC dressing changed at least once a week. Contact a health care provider if: You have pain in your arm, ear, face, or teeth. You have a fever or chills. You have redness, swelling, or pain around the insertion site. You have fluid or blood coming from the insertion site. Your insertion site feels warm to the touch. You have pus or a bad smell coming from the insertion site. Your skin feels hard and raised around the insertion site. Your PICC dressing has gotten wet or is coming off and you have not been taught how to change it. Get help right away if: You have problems with your PICC, such as  your PICC: Was tugged or pulled and has partially come out. Do not  push the PICC back in. Cannot be flushed, is hard to flush, or leaks around the insertion site when it is flushed. Makes a flushing sound when it is flushed. Appears to have a hole or tear. Is accidentally pulled all the way out. If this happens, cover the insertion site with a gauze dressing. Do not throw the PICC away. Your health care provider will need to check it to be sure the entire catheter came out. You feel your heart racing or skipping beats, or you have chest pain. You have shortness of breath or trouble breathing. You have swelling, redness, warmth, or pain in the arm in which the PICC is placed. You have a red streak going up your arm that  starts under the PICC dressing. These symptoms may be an emergency. Get help right away. Call 911. Do not wait to see if the symptoms will go away. Do not drive yourself to the hospital. Summary A peripherally inserted central catheter (PICC) is a long, thin, flexible tube (catheter) that is put into a vein in the arm or leg. If cared for properly, a PICC can remain in place for many months. Having a PICC can allow you to go home from the hospital sooner and continue treatment at home. The PICC is inserted using a germ-free (sterile) technique by a specially trained health care provider. Only a trained health care provider should remove it. Do not have your blood pressure checked on the arm in which your PICC is placed. Always keep your PICC identification card with you. This information is not intended to replace advice given to you by your health care provider. Make sure you discuss any questions you have with your health care provider. Document Revised: 01/30/2021 Document Reviewed: 01/30/2021 Elsevier Patient Education  2024 [employer].

## 2024-03-17 ENCOUNTER — Other Ambulatory Visit: Payer: Self-pay

## 2024-03-18 ENCOUNTER — Inpatient Hospital Stay

## 2024-03-18 NOTE — Patient Instructions (Signed)
 PICC Home Care Guide A peripherally inserted central catheter (PICC) is a form of IV access that allows medicines and IV fluids to be quickly put into the blood and spread throughout the body. The PICC is a long, thin, flexible tube (catheter) that is put into a vein in a person's arm or leg. The catheter ends in a large vein just outside the heart called the superior vena cava (SVC). After the PICC is put in, a chest X-ray may be done to make sure that it is in the right place. A PICC may be placed for different reasons, such as: To give medicines and liquid nutrition. To give IV fluids and blood products. To take blood samples often. If there is trouble placing a peripheral intravenous (PIV) catheter. If cared for properly, a PICC can remain in place for many months. Having a PICC can allow you to go home from the hospital sooner and continue treatment at home. Medicines and PICC care can be managed at home by a family member, caregiver, or home health care team. What are the risks? Generally, having a PICC is safe. However, problems may occur, including: A blood clot (thrombus) forming in or at the end of the PICC. A blood clot forming in a vein (deep vein thrombosis) or traveling to the lung (pulmonary embolism). Inflammation of the vein (phlebitis) in which the PICC is placed. Infection at the insertion site or in the blood. Blood infections from central lines, like PICCs, can be serious and often require a hospital stay. PICC malposition, or PICC movement or poor placement. A break or cut in the PICC. Do not use scissors near the PICC. Nerve or tendon irritation or injury during PICC insertion. How to care for your PICC Please follow the specific guidelines provided by your health care provider. Preventing infection You and any caregivers should wash your hands often with soap and water for at least 20 seconds. Wash hands: Before touching the PICC or the infusion device. Before changing a  bandage (dressing). Do not change the dressing unless you have been taught to do so and have shown you are able to change it safely. Flush the PICC as told. Tell your health care provider right away if the PICC is hard to flush or does not flush. Do not use force to flush the PICC. Use clean and germ-free (sterile) supplies only. Keep the supplies in a dry place. Do not reuse needles, syringes, or any other supplies. Reusing supplies can lead to infection. Keep the PICC dressing dry and secure it with tape if the edges stop sticking to your skin. Check your PICC insertion site every day for signs of infection. Check for: Redness, swelling, or pain. Fluid or blood. Warmth. Pus or a bad smell. Preventing other problems Do not use a syringe that is less than 10 mL to flush the PICC. Do not have your blood pressure checked on the arm in which the PICC is placed. Do not ever pull or tug on the PICC. Keep it secured to your arm with tape or a stretch wrap when not in use. Do not take the PICC out yourself. Only a trained health care provider should remove the PICC. Keep pets and children away from your PICC. How to care for your PICC dressing Keep your PICC dressing clean and dry to prevent infection. Do not take baths, swim, or use a hot tub until your health care provider approves. Ask your health care provider if you can take  showers. You may only be allowed to take sponge baths. When you are allowed to shower: Ask your health care provider to teach you how to wrap the PICC. Cover the PICC with clear plastic wrap and tape to keep it dry while showering. Follow instructions from your health care provider about how to take care of your insertion site and dressing. Make sure you: Wash your hands with soap and water for at least 20 seconds before and after you change your dressing. If soap and water are not available, use hand sanitizer. Change your dressing only if taught to do so by your health care  provider. Your PICC dressing needs to be changed if it becomes loose or wet. Leave stitches (sutures), skin glue, or adhesive strips in place. These skin closures may need to stay in place for 2 weeks or longer. If adhesive strip edges start to loosen and curl up, you may trim the loose edges. Do not remove adhesive strips completely unless your health care provider tells you to do that. Follow these instructions at home: Disposal of supplies Throw away any syringes in a disposal container that is meant for sharp items (sharps container). You can buy a sharps container from a pharmacy, or you can make one by using an empty, hard plastic bottle with a lid. Place any used dressings or infusion bags into a plastic bag. Throw that bag in the trash. General instructions  Always carry your PICC identification card or wear a medical alert bracelet. Keep the tube clamped at all times, unless it is being used. Always carry a smooth-edge clamp with you to clamp the PICC if it breaks. Do not use scissors or sharp objects near the tube. You may bend your arm and move it freely. If your PICC is near or at the bend of your elbow, avoid activity with repeated motion at the elbow. Avoid lifting heavy objects as told by your health care provider. Keep all follow-up visits. This is important. You will need to have your PICC dressing changed at least once a week. Contact a health care provider if: You have pain in your arm, ear, face, or teeth. You have a fever or chills. You have redness, swelling, or pain around the insertion site. You have fluid or blood coming from the insertion site. Your insertion site feels warm to the touch. You have pus or a bad smell coming from the insertion site. Your skin feels hard and raised around the insertion site. Your PICC dressing has gotten wet or is coming off and you have not been taught how to change it. Get help right away if: You have problems with your PICC, such as  your PICC: Was tugged or pulled and has partially come out. Do not  push the PICC back in. Cannot be flushed, is hard to flush, or leaks around the insertion site when it is flushed. Makes a flushing sound when it is flushed. Appears to have a hole or tear. Is accidentally pulled all the way out. If this happens, cover the insertion site with a gauze dressing. Do not throw the PICC away. Your health care provider will need to check it to be sure the entire catheter came out. You feel your heart racing or skipping beats, or you have chest pain. You have shortness of breath or trouble breathing. You have swelling, redness, warmth, or pain in the arm in which the PICC is placed. You have a red streak going up your arm that  starts under the PICC dressing. These symptoms may be an emergency. Get help right away. Call 911. Do not wait to see if the symptoms will go away. Do not drive yourself to the hospital. Summary A peripherally inserted central catheter (PICC) is a long, thin, flexible tube (catheter) that is put into a vein in the arm or leg. If cared for properly, a PICC can remain in place for many months. Having a PICC can allow you to go home from the hospital sooner and continue treatment at home. The PICC is inserted using a germ-free (sterile) technique by a specially trained health care provider. Only a trained health care provider should remove it. Do not have your blood pressure checked on the arm in which your PICC is placed. Always keep your PICC identification card with you. This information is not intended to replace advice given to you by your health care provider. Make sure you discuss any questions you have with your health care provider. Document Revised: 01/30/2021 Document Reviewed: 01/30/2021 Elsevier Patient Education  2024 [employer].

## 2024-03-21 ENCOUNTER — Encounter (INDEPENDENT_AMBULATORY_CARE_PROVIDER_SITE_OTHER): Payer: Medicare HMO | Admitting: Ophthalmology

## 2024-03-21 DIAGNOSIS — H3412 Central retinal artery occlusion, left eye: Secondary | ICD-10-CM | POA: Diagnosis not present

## 2024-03-21 DIAGNOSIS — I10 Essential (primary) hypertension: Secondary | ICD-10-CM | POA: Diagnosis not present

## 2024-03-21 DIAGNOSIS — H35033 Hypertensive retinopathy, bilateral: Secondary | ICD-10-CM | POA: Diagnosis not present

## 2024-03-21 DIAGNOSIS — H43813 Vitreous degeneration, bilateral: Secondary | ICD-10-CM

## 2024-03-22 ENCOUNTER — Ambulatory Visit

## 2024-03-22 ENCOUNTER — Inpatient Hospital Stay

## 2024-03-22 ENCOUNTER — Inpatient Hospital Stay (HOSPITAL_BASED_OUTPATIENT_CLINIC_OR_DEPARTMENT_OTHER): Admitting: Oncology

## 2024-03-22 ENCOUNTER — Telehealth: Payer: Self-pay

## 2024-03-22 VITALS — BP 155/70 | HR 84 | Temp 97.6°F | Resp 18 | Ht 62.0 in | Wt 140.6 lb

## 2024-03-22 DIAGNOSIS — D509 Iron deficiency anemia, unspecified: Secondary | ICD-10-CM | POA: Diagnosis not present

## 2024-03-22 DIAGNOSIS — Z452 Encounter for adjustment and management of vascular access device: Secondary | ICD-10-CM | POA: Diagnosis not present

## 2024-03-22 DIAGNOSIS — D500 Iron deficiency anemia secondary to blood loss (chronic): Secondary | ICD-10-CM

## 2024-03-22 DIAGNOSIS — D649 Anemia, unspecified: Secondary | ICD-10-CM

## 2024-03-22 DIAGNOSIS — K921 Melena: Secondary | ICD-10-CM | POA: Diagnosis not present

## 2024-03-22 LAB — IRON AND TIBC
Iron: 111 ug/dL (ref 28–170)
Saturation Ratios: 29 % (ref 10.4–31.8)
TIBC: 381 ug/dL (ref 250–450)
UIBC: 270 ug/dL

## 2024-03-22 LAB — FERRITIN: Ferritin: 76 ng/mL (ref 11–307)

## 2024-03-22 LAB — CBC WITH DIFFERENTIAL (CANCER CENTER ONLY)
Abs Immature Granulocytes: 0.01 K/uL (ref 0.00–0.07)
Basophils Absolute: 0 K/uL (ref 0.0–0.1)
Basophils Relative: 1 %
Eosinophils Absolute: 0.7 K/uL — ABNORMAL HIGH (ref 0.0–0.5)
Eosinophils Relative: 16 %
HCT: 29.7 % — ABNORMAL LOW (ref 36.0–46.0)
Hemoglobin: 9.2 g/dL — ABNORMAL LOW (ref 12.0–15.0)
Immature Granulocytes: 0 %
Lymphocytes Relative: 9 %
Lymphs Abs: 0.4 K/uL — ABNORMAL LOW (ref 0.7–4.0)
MCH: 31.2 pg (ref 26.0–34.0)
MCHC: 31 g/dL (ref 30.0–36.0)
MCV: 100.7 fL — ABNORMAL HIGH (ref 80.0–100.0)
Monocytes Absolute: 0.3 K/uL (ref 0.1–1.0)
Monocytes Relative: 7 %
Neutro Abs: 3.1 K/uL (ref 1.7–7.7)
Neutrophils Relative %: 67 %
Platelet Count: 138 K/uL — ABNORMAL LOW (ref 150–400)
RBC: 2.95 MIL/uL — ABNORMAL LOW (ref 3.87–5.11)
RDW: 16.7 % — ABNORMAL HIGH (ref 11.5–15.5)
WBC Count: 4.6 K/uL (ref 4.0–10.5)
nRBC: 0 % (ref 0.0–0.2)

## 2024-03-22 LAB — CMP (CANCER CENTER ONLY)
ALT: 23 U/L (ref 0–44)
AST: 27 U/L (ref 15–41)
Albumin: 4.1 g/dL (ref 3.5–5.0)
Alkaline Phosphatase: 106 U/L (ref 38–126)
Anion gap: 10 (ref 5–15)
BUN: 15 mg/dL (ref 8–23)
CO2: 24 mmol/L (ref 22–32)
Calcium: 9.9 mg/dL (ref 8.9–10.3)
Chloride: 107 mmol/L (ref 98–111)
Creatinine: 1.1 mg/dL — ABNORMAL HIGH (ref 0.44–1.00)
GFR, Estimated: 51 mL/min — ABNORMAL LOW (ref >60)
Glucose, Bld: 179 mg/dL — ABNORMAL HIGH (ref 70–99)
Potassium: 3.9 mmol/L (ref 3.5–5.1)
Sodium: 141 mmol/L (ref 135–145)
Total Bilirubin: 0.7 mg/dL (ref 0.0–1.2)
Total Protein: 6.6 g/dL (ref 6.5–8.1)

## 2024-03-22 LAB — LACTATE DEHYDROGENASE: LDH: 198 U/L — ABNORMAL HIGH (ref 98–192)

## 2024-03-22 NOTE — Telephone Encounter (Signed)
 Spoke with patient she is sch to come in to have dexcom placed

## 2024-03-22 NOTE — Progress Notes (Unsigned)
 [facility]  HEMATOLOGY CLINIC PROGRESS NOTE  PATIENT NAME: Anita Mccormick   MR#: [id number] DOB: [birth year]  Patient Care Team: Mercer Clotilda SAUNDERS, MD as PCP - General (Family Medicine) [place], Peter M, MD as PCP - Cardiology (Cardiology) Octavia Bruckner, MD as Consulting Physician (Ophthalmology) Lionell Jon DEL, [facility] (Pharmacist)  Date of visit: 03/22/2024   ASSESSMENT & PLAN:   Anita Mccormick is a 78 y.o. lady with a past medical history of acute on chronic anemia due to gastrointestinal bleeding, history of gastric and small bowel AVMs/telangiectasias, CAD s/p PEA arrest and NSTEMI during hospitalization in 09/2023, congestive heart failure, history of CVA, diabetes mellitus, hypertension, Hx of left breast cancer in early 2000s, s/p mastectomy and axillary LN dissection, did not receive adjuvant chemo, was referred to our service in June 2025 for evaluation of iron  deficiency anemia.     Anemia due to chronic blood loss Chronic anemia with a history of low hemoglobin levels, previously as low as 5.4 in May 2025, improved to 8.1 after transfusions.  Likely due to gastrointestinal bleeding causing slow blood loss.   Currently on oral iron  supplements (Fergon) every other day, tolerated well without constipation.   Occasional black stools and minor blood traces when wiping reported, possibly due to hemorrhoids, though she denies a history of hemorrhoids.  On her consultation with us  on 01/25/2024, labs revealed persistent anemia with hemoglobin of 7.7, MCV 97.  White count and platelet count were normal. Iron  studies continued to show evidence of iron  deficiency with iron  saturation of 5%, iron  decreased to 23.  B12, folate were within normal limits.  LDH borderline at 217.   Given persistent iron  deficiency anemia despite oral iron  supplementation, we proceeded with IV iron  using Venofer  x 5 doses.  Labs today showed slightly improved hemoglobin of 9.2, MCV 100.7.   White count within normal limits.  Platelet count close to normal at 138,000.  Creatinine 1.1, grossly unremarkable CMP.  Iron  studies currently show no evidence of iron  deficiency.  LDH better at 198.  Suboptimal response noted to IV iron , although hemoglobin did improve.  On return visit, we will pursue additional testing to rule out other etiologies for anemia including workup for monoclonal gammopathy.  It is likely that ongoing anemia is from occult GI blood loss from AVMs.  - Continue oral iron  supplements (Fergon) every other day.  RTC as scheduled in late September 2025.   Chronic indwelling peripherally inserted central catheter (PICC) management PICC line in place for IV iron  therapy. She expresses desire to remove PICC line due to inconvenience and difficulty with bathing. Discussed alternative of a port-a-cath, which would allow for easier management and less frequent maintenance. Port-a-cath involves a procedure with potential soreness for a day or two post-placement but allows for showering and no need for dressing changes. She is considering this option and will discuss with her daughter. - Continue current PICC line management until next appointment - Discuss port-a-cath option with daughter - Consider port-a-cath placement if she decides to proceed   I spent a total of 30 minutes during this encounter with the patient including review of chart and various tests results, discussions about plan of care and coordination of care plan.  I reviewed lab results and outside records for this visit and discussed relevant results with the patient. Diagnosis, plan of care and treatment options were also discussed in detail with the patient. Opportunity provided to ask questions and answers provided to her  apparent satisfaction. Provided instructions to call our clinic with any problems, questions or concerns prior to return visit. I recommended to continue follow-up with PCP and  sub-specialists. She verbalized understanding and agreed with the plan. No barriers to learning was detected.  Chinita Patten, MD  03/22/2024 11:33 AM  [facility] - A DEPT OF [name] [address] [place] [address] Dept: [phone] Dept Fax: [phone]   CHIEF COMPLAINT/ REASON FOR VISIT:  Follow-up for chronic anemia, likely from occult GI blood loss.  Treated with IV iron   INTERVAL HISTORY:  Discussed the use of AI scribe software for clinical note transcription with the patient, who gave verbal consent to proceed.  History of Present Illness Anita Mccormick is a 78 year old female with iron  deficiency anemia who presents for follow-up of her anemia management.  She has been receiving intravenous iron  therapy and continues to have a peripherally inserted central catheter (PICC) line in place. Her hemoglobin has improved from 7.7 to 9.2 g/dL. She is also taking oral ferrous sulfate  every other day, omitting the dose on days she receives an infusion. Her ferritin levels have increased from 26 to 76 ng/mL.  She denies noticeable blood loss, such as hematochezia or melena, except for black stools associated with iron  supplementation. She experiences fatigue and dyspnea with minimal exertion, such as walking 20 steps to the bathroom. She reports a sharp, intermittent pain beside her umbilicus, but it is not constant.  Her past medical history includes a lung puncture in March, for which she is using an inhaler. She also has a history of diabetes and has undergone a left-sided mastectomy. She mentions a loss of sight in her left eye and a recent ophthalmology visit where a dye was used, resulting in high yellow-green urine.  She has undergone a colonoscopy and capsule endoscopy, which revealed a few small lesions that appeared to have healed. She is concerned about the possibility of ongoing blood  loss.   SUMMARY OF HEMATOLOGIC HISTORY:  She was hospitalized 5/28-12/27/23 for acute on chronic anemia due to GIB.  Patient given 2 units PRBCs.  Hemoglobin improved to 9.0 at time of discharge.  Patient with history of AVM/angioectasis noted on EGD 4/15.  GI consulted.  Capsule endoscopy 12/25/2023.  No active bleeding appreciated but relatively fast transit time could not limit small findings such as AVMs.  Endoscopic intervention was not amenable.  She was advised to continue iron  supplementation and referral was sent to us  for further evaluation and management of iron  deficiency anemia.   She has undergone endoscopic evaluations and iron  infusions, though she experiences difficulty with venous access during these procedures, requiring multiple attempts to establish an IV line.   Her hemoglobin level improved to 8.1 g/dL in June 2025 from a previous low of 5.4 g/dL following transfusions. She experiences dizziness, shortness of breath, and coughing. No current chest pain, but ongoing shortness of breath is noted.   She takes Fergon, an iron  supplement, every other day as prescribed since her last hospital visit, and tolerates it well without constipation. She notes black stools and occasional traces of blood when wiping, but denies any history of hemorrhoids, epistaxis, or gum bleeding.   Her past medical history includes breast cancer treated with surgery approximately 20 years ago, without chemotherapy or radiation. She had implants placed and later removed due to rupture. She does not recall taking hormone-blocking medications post-surgery.   In  March 2025, she experienced a rib fracture and lung puncture during CPR, which required a chest tube. She expresses gratitude towards the medical staff involved in her care during that incident.  Chronic anemia with a history of low hemoglobin levels, previously as low as 5.4 in May 2025, improved to 8.1 after transfusions.   Likely due to  gastrointestinal bleeding causing slow blood loss.    Currently on oral iron  supplements (Fergon) every other day, tolerating well without constipation.    Occasional black stools and minor blood traces when wiping reported, possibly due to hemorrhoids, though she denies a history of hemorrhoids.   On her consultation with us  on 01/25/2024, labs revealed persistent anemia with hemoglobin of 7.7, MCV 97.  White count and platelet count were normal. Iron  studies continued to show evidence of iron  deficiency with iron  saturation of 5%, iron  decreased to 23.  B12, folate were within normal limits.  LDH borderline at 217.   Given persistent iron  deficiency anemia despite oral iron  supplementation, we proceeded with IV iron  using Venofer  x 5 doses.    I have reviewed the past medical history, past surgical history, social history and family history with the patient and they are unchanged from previous note.  ALLERGIES: She is allergic to bee venom, penicillins, ace inhibitors, chlorthalidone, lactose intolerance (gi), metformin , and sulfamethoxazole.  MEDICATIONS:  Current Outpatient Medications  Medication Sig Dispense Refill   ACCU-CHEK GUIDE TEST test strip 1 each by Other route 3 (three) times daily.     acetaminophen  (TYLENOL ) 325 MG tablet Take 2 tablets (650 mg total) by mouth every 6 (six) hours as needed for mild pain (pain score 1-3) (or Fever >/= 101).     albuterol  (VENTOLIN  HFA) 108 (90 Base) MCG/ACT inhaler Inhale 2 puffs into the lungs every 6 (six) hours as needed for wheezing or shortness of breath. 6.7 g 0   aspirin  EC 81 MG tablet Take 81 mg by mouth daily. Swallow whole.     atorvastatin  (LIPITOR ) 80 MG tablet Take 1 tablet (80 mg total) by mouth daily. 90 tablet 3   Blood Glucose Monitoring Suppl (ACCU-CHEK GUIDE ME) w/Device KIT Inject 1 Units into the skin 3 (three) times daily.     Budeson-Glycopyrrol-Formoterol  (BREZTRI AEROSPHERE IN) Inhale 2 puffs into the lungs 2 (two)  times daily.     clopidogrel  (PLAVIX ) 75 MG tablet Take 1 tablet (75 mg total) by mouth daily. 90 tablet 2   Continuous Glucose Sensor (DEXCOM G7 SENSOR) MISC APPLY ONE SENSOR EVERY 21 DAYS 2 each 11   Dulaglutide  (TRULICITY ) 1.5 MG/0.5ML SOAJ Inject 1.5 mg into the skin once a week.     ferrous gluconate  (FERGON) 324 MG tablet Take 1 tablet (324 mg total) by mouth every other day.     glipiZIDE  (GLUCOTROL  XL) 10 MG 24 hr tablet TAKE 1 TABLET TWICE DAILY 180 tablet 3   glucose 4 GM chewable tablet Chew 1 tablet by mouth as needed for low blood sugar.     metoprolol  succinate (TOPROL -XL) 25 MG 24 hr tablet Take 1 tablet (25 mg total) by mouth daily. 90 tablet 1   Multiple Vitamin (MULTIVITAMIN) tablet Take 1 tablet by mouth daily.     nitroGLYCERIN  (NITROSTAT ) 0.4 MG SL tablet Place 1 tablet (0.4 mg total) under the tongue every 5 (five) minutes as needed for chest pain. 10 tablet 5   pantoprazole  (PROTONIX ) 40 MG tablet Take 1 tablet (40 mg total) by mouth daily. (Patient taking differently: Take  40 mg by mouth 2 (two) times daily.) 90 tablet 3   No current facility-administered medications for this visit.     REVIEW OF SYSTEMS:    Review of Systems - Oncology  All other pertinent systems were reviewed with the patient and are negative.  PHYSICAL EXAMINATION:    Onc Performance Status - 03/22/24 1126       ECOG Perf Status   ECOG Perf Status Ambulatory and capable of all selfcare but unable to carry out any work activities.  Up and about more than 50% of waking hours      KPS SCALE   KPS % SCORE Cares for self, unable to carry on normal activity or to do active work          Vitals:   03/22/24 1121  BP: (!) 155/70  Pulse: 84  Resp: 18  Temp: 97.6 F (36.4 C)  SpO2: 100%   Filed Weights   03/22/24 1121  Weight: 140 lb 9.6 oz (63.8 kg)    Physical Exam Constitutional:      General: She is not in acute distress.    Appearance: Normal appearance.  HENT:     Head:  Normocephalic and atraumatic.  Cardiovascular:     Rate and Rhythm: Normal rate.  Pulmonary:     Effort: Pulmonary effort is normal. No respiratory distress.  Abdominal:     General: There is no distension.  Musculoskeletal:     Comments: Right arm PICC line in place  Neurological:     General: No focal deficit present.     Mental Status: She is alert and oriented to person, place, and time.  Psychiatric:        Mood and Affect: Mood normal.        Behavior: Behavior normal.     LABORATORY DATA:   I have reviewed the data as listed.  Results for orders placed or performed in visit on 03/22/24  Ferritin  Result Value Ref Range   Ferritin 76 11 - 307 ng/mL  Iron  and TIBC  Result Value Ref Range   Iron  111 28 - 170 ug/dL   TIBC 618 749 - 549 ug/dL   Saturation Ratios 29 10.4 - 31.8 %   UIBC 270 ug/dL  Lactate dehydrogenase  Result Value Ref Range   LDH 198 (H) 98 - 192 U/L  CBC with Differential (Cancer Center Only)  Result Value Ref Range   WBC Count 4.6 4.0 - 10.5 K/uL   RBC 2.95 (L) 3.87 - 5.11 MIL/uL   Hemoglobin 9.2 (L) 12.0 - 15.0 g/dL   HCT 70.2 (L) 63.9 - 53.9 %   MCV 100.7 (H) 80.0 - 100.0 fL   MCH 31.2 26.0 - 34.0 pg   MCHC 31.0 30.0 - 36.0 g/dL   RDW 83.2 (H) 88.4 - 84.4 %   Platelet Count 138 (L) 150 - 400 K/uL   nRBC 0.0 0.0 - 0.2 %   Neutrophils Relative % 67 %   Neutro Abs 3.1 1.7 - 7.7 K/uL   Lymphocytes Relative 9 %   Lymphs Abs 0.4 (L) 0.7 - 4.0 K/uL   Monocytes Relative 7 %   Monocytes Absolute 0.3 0.1 - 1.0 K/uL   Eosinophils Relative 16 %   Eosinophils Absolute 0.7 (H) 0.0 - 0.5 K/uL   Basophils Relative 1 %   Basophils Absolute 0.0 0.0 - 0.1 K/uL   Immature Granulocytes 0 %   Abs Immature Granulocytes 0.01 0.00 - 0.07 K/uL  CMP (Cancer Center only)  Result Value Ref Range   Sodium 141 135 - 145 mmol/L   Potassium 3.9 3.5 - 5.1 mmol/L   Chloride 107 98 - 111 mmol/L   CO2 24 22 - 32 mmol/L   Glucose, Bld 179 (H) 70 - 99 mg/dL   BUN 15  8 - 23 mg/dL   Creatinine 8.89 (H) 9.55 - 1.00 mg/dL   Calcium  9.9 8.9 - 10.3 mg/dL   Total Protein 6.6 6.5 - 8.1 g/dL   Albumin  4.1 3.5 - 5.0 g/dL   AST 27 15 - 41 U/L   ALT 23 0 - 44 U/L   Alkaline Phosphatase 106 38 - 126 U/L   Total Bilirubin 0.7 0.0 - 1.2 mg/dL   GFR, Estimated 51 (L) >60 mL/min   Anion gap 10 5 - 15     RADIOGRAPHIC STUDIES:  No recent pertinent imaging studies available to review.  Orders Placed This Encounter  Procedures   CBC with Differential (Cancer Center Only)    Standing Status:   Future    Expected Date:   04/26/2024    Expiration Date:   07/25/2024   CMP (Cancer Center only)    Standing Status:   Future    Expected Date:   04/26/2024    Expiration Date:   07/25/2024   Iron  and TIBC    Standing Status:   Future    Expected Date:   04/26/2024    Expiration Date:   07/25/2024   Ferritin    Standing Status:   Future    Expected Date:   04/26/2024    Expiration Date:   07/25/2024   Multiple Myeloma Panel (SPEP&IFE w/QIG)    Standing Status:   Future    Expected Date:   04/26/2024    Expiration Date:   07/25/2024   Kappa/lambda light chains    Standing Status:   Future    Expected Date:   04/26/2024    Expiration Date:   07/25/2024   TSH    Standing Status:   Future    Expected Date:   04/26/2024    Expiration Date:   07/25/2024   Direct antiglobulin test (not at [facility])    Standing Status:   Future    Expected Date:   04/26/2024    Expiration Date:   07/25/2024     Future Appointments  Date Time Provider Department Center  03/25/2024 12:30 PM [facility] INFUSION [facility] None  04/06/2024  3:15 PM Gaynel Delon CROME, DPM [facility]  04/13/2024  8:40 AM Legrand, Victory CROME MOULD, MD [facility]  04/26/2024 11:15 AM [facility] INFUSION [facility] None  04/26/2024 11:15 AM [facility] PHLEBOTOMIST [facility] None  04/26/2024 11:30 AM Lynessa Almanzar, Chinita, MD [facility] None  04/26/2024 12:15 PM [facility] INF[facility] None  04/28/2024 11:20 AM  Luke Chiquita SAUNDERS, DO [facility] [address]  04/29/2024 12:30 PM [facility] INFUSION [facility] None  05/03/2024 12:30 PM [facility] INFUSION [facility] None  05/06/2024 12:30 PM [facility] INFUS[facility] None  05/10/2024 12:30 PM [facility] INFUSION [facility] None  05/18/2024 10:30 AM Mercer Clotilda SAUNDERS, MD [facility] [address]  03/22/2025 12:20 PM Alvia Norleen BIRCH, MD [facility] None     This document was completed utilizing speech recognition software. Grammatical errors, random word insertions, pronoun errors, and incomplete sentences are an occasional consequence of this system due to software limitations, ambient noise, and hardware issues. Any formal questions or concerns about the content, text or information contained within the body of this dictation  should be directly addressed to the provider for clarification.

## 2024-03-22 NOTE — Telephone Encounter (Signed)
 Copied from CRM #[id number]. Topic: Clinical - Prescription Issue >> Mar 22, 2024  8:56 AM Macario HERO wrote: Reason for CRM: Patient is requesting  a call back from [facility] Dr. Mercer regarding a sample of Continuous Glucose Sensor (DEXCOM G7 SENSOR) MISC [[id number]]. She said she will need it today before her appointment.

## 2024-03-22 NOTE — Patient Instructions (Signed)
 PICC Home Care Guide A peripherally inserted central catheter (PICC) is a form of IV access that allows medicines and IV fluids to be quickly put into the blood and spread throughout the body. The PICC is a long, thin, flexible tube (catheter) that is put into a vein in a person's arm or leg. The catheter ends in a large vein just outside the heart called the superior vena cava (SVC). After the PICC is put in, a chest X-ray may be done to make sure that it is in the right place. A PICC may be placed for different reasons, such as: To give medicines and liquid nutrition. To give IV fluids and blood products. To take blood samples often. If there is trouble placing a peripheral intravenous (PIV) catheter. If cared for properly, a PICC can remain in place for many months. Having a PICC can allow you to go home from the hospital sooner and continue treatment at home. Medicines and PICC care can be managed at home by a family member, caregiver, or home health care team. What are the risks? Generally, having a PICC is safe. However, problems may occur, including: A blood clot (thrombus) forming in or at the end of the PICC. A blood clot forming in a vein (deep vein thrombosis) or traveling to the lung (pulmonary embolism). Inflammation of the vein (phlebitis) in which the PICC is placed. Infection at the insertion site or in the blood. Blood infections from central lines, like PICCs, can be serious and often require a hospital stay. PICC malposition, or PICC movement or poor placement. A break or cut in the PICC. Do not use scissors near the PICC. Nerve or tendon irritation or injury during PICC insertion. How to care for your PICC Please follow the specific guidelines provided by your health care provider. Preventing infection You and any caregivers should wash your hands often with soap and water for at least 20 seconds. Wash hands: Before touching the PICC or the infusion device. Before changing a  bandage (dressing). Do not change the dressing unless you have been taught to do so and have shown you are able to change it safely. Flush the PICC as told. Tell your health care provider right away if the PICC is hard to flush or does not flush. Do not use force to flush the PICC. Use clean and germ-free (sterile) supplies only. Keep the supplies in a dry place. Do not reuse needles, syringes, or any other supplies. Reusing supplies can lead to infection. Keep the PICC dressing dry and secure it with tape if the edges stop sticking to your skin. Check your PICC insertion site every day for signs of infection. Check for: Redness, swelling, or pain. Fluid or blood. Warmth. Pus or a bad smell. Preventing other problems Do not use a syringe that is less than 10 mL to flush the PICC. Do not have your blood pressure checked on the arm in which the PICC is placed. Do not ever pull or tug on the PICC. Keep it secured to your arm with tape or a stretch wrap when not in use. Do not take the PICC out yourself. Only a trained health care provider should remove the PICC. Keep pets and children away from your PICC. How to care for your PICC dressing Keep your PICC dressing clean and dry to prevent infection. Do not take baths, swim, or use a hot tub until your health care provider approves. Ask your health care provider if you can take  showers. You may only be allowed to take sponge baths. When you are allowed to shower: Ask your health care provider to teach you how to wrap the PICC. Cover the PICC with clear plastic wrap and tape to keep it dry while showering. Follow instructions from your health care provider about how to take care of your insertion site and dressing. Make sure you: Wash your hands with soap and water for at least 20 seconds before and after you change your dressing. If soap and water are not available, use hand sanitizer. Change your dressing only if taught to do so by your health care  provider. Your PICC dressing needs to be changed if it becomes loose or wet. Leave stitches (sutures), skin glue, or adhesive strips in place. These skin closures may need to stay in place for 2 weeks or longer. If adhesive strip edges start to loosen and curl up, you may trim the loose edges. Do not remove adhesive strips completely unless your health care provider tells you to do that. Follow these instructions at home: Disposal of supplies Throw away any syringes in a disposal container that is meant for sharp items (sharps container). You can buy a sharps container from a pharmacy, or you can make one by using an empty, hard plastic bottle with a lid. Place any used dressings or infusion bags into a plastic bag. Throw that bag in the trash. General instructions  Always carry your PICC identification card or wear a medical alert bracelet. Keep the tube clamped at all times, unless it is being used. Always carry a smooth-edge clamp with you to clamp the PICC if it breaks. Do not use scissors or sharp objects near the tube. You may bend your arm and move it freely. If your PICC is near or at the bend of your elbow, avoid activity with repeated motion at the elbow. Avoid lifting heavy objects as told by your health care provider. Keep all follow-up visits. This is important. You will need to have your PICC dressing changed at least once a week. Contact a health care provider if: You have pain in your arm, ear, face, or teeth. You have a fever or chills. You have redness, swelling, or pain around the insertion site. You have fluid or blood coming from the insertion site. Your insertion site feels warm to the touch. You have pus or a bad smell coming from the insertion site. Your skin feels hard and raised around the insertion site. Your PICC dressing has gotten wet or is coming off and you have not been taught how to change it. Get help right away if: You have problems with your PICC, such as  your PICC: Was tugged or pulled and has partially come out. Do not  push the PICC back in. Cannot be flushed, is hard to flush, or leaks around the insertion site when it is flushed. Makes a flushing sound when it is flushed. Appears to have a hole or tear. Is accidentally pulled all the way out. If this happens, cover the insertion site with a gauze dressing. Do not throw the PICC away. Your health care provider will need to check it to be sure the entire catheter came out. You feel your heart racing or skipping beats, or you have chest pain. You have shortness of breath or trouble breathing. You have swelling, redness, warmth, or pain in the arm in which the PICC is placed. You have a red streak going up your arm that  starts under the PICC dressing. These symptoms may be an emergency. Get help right away. Call 911. Do not wait to see if the symptoms will go away. Do not drive yourself to the hospital. Summary A peripherally inserted central catheter (PICC) is a long, thin, flexible tube (catheter) that is put into a vein in the arm or leg. If cared for properly, a PICC can remain in place for many months. Having a PICC can allow you to go home from the hospital sooner and continue treatment at home. The PICC is inserted using a germ-free (sterile) technique by a specially trained health care provider. Only a trained health care provider should remove it. Do not have your blood pressure checked on the arm in which your PICC is placed. Always keep your PICC identification card with you. This information is not intended to replace advice given to you by your health care provider. Make sure you discuss any questions you have with your health care provider. Document Revised: 01/30/2021 Document Reviewed: 01/30/2021 Elsevier Patient Education  2024 [employer].

## 2024-03-23 ENCOUNTER — Encounter: Payer: Self-pay | Admitting: Oncology

## 2024-03-23 DIAGNOSIS — I2510 Atherosclerotic heart disease of native coronary artery without angina pectoris: Secondary | ICD-10-CM | POA: Diagnosis not present

## 2024-03-23 DIAGNOSIS — E1122 Type 2 diabetes mellitus with diabetic chronic kidney disease: Secondary | ICD-10-CM | POA: Diagnosis not present

## 2024-03-23 DIAGNOSIS — D509 Iron deficiency anemia, unspecified: Secondary | ICD-10-CM | POA: Diagnosis not present

## 2024-03-23 DIAGNOSIS — D539 Nutritional anemia, unspecified: Secondary | ICD-10-CM | POA: Diagnosis not present

## 2024-03-23 DIAGNOSIS — I130 Hypertensive heart and chronic kidney disease with heart failure and stage 1 through stage 4 chronic kidney disease, or unspecified chronic kidney disease: Secondary | ICD-10-CM | POA: Diagnosis not present

## 2024-03-23 DIAGNOSIS — J449 Chronic obstructive pulmonary disease, unspecified: Secondary | ICD-10-CM | POA: Diagnosis not present

## 2024-03-23 DIAGNOSIS — N182 Chronic kidney disease, stage 2 (mild): Secondary | ICD-10-CM | POA: Diagnosis not present

## 2024-03-23 DIAGNOSIS — I700 Atherosclerosis of aorta: Secondary | ICD-10-CM | POA: Diagnosis not present

## 2024-03-23 DIAGNOSIS — I5032 Chronic diastolic (congestive) heart failure: Secondary | ICD-10-CM | POA: Diagnosis not present

## 2024-03-23 NOTE — Assessment & Plan Note (Addendum)
 Chronic anemia with a history of low hemoglobin levels, previously as low as 5.4 in May 2025, improved to 8.1 after transfusions.  Likely due to gastrointestinal bleeding causing slow blood loss.   Currently on oral iron  supplements (Fergon) every other day, tolerated well without constipation.   Occasional black stools and minor blood traces when wiping reported, possibly due to hemorrhoids, though she denies a history of hemorrhoids.  On her consultation with us  on 01/25/2024, labs revealed persistent anemia with hemoglobin of 7.7, MCV 97.  White count and platelet count were normal. Iron  studies continued to show evidence of iron  deficiency with iron  saturation of 5%, iron  decreased to 23.  B12, folate were within normal limits.  LDH borderline at 217.   Given persistent iron  deficiency anemia despite oral iron  supplementation, we proceeded with IV iron  using Venofer  x 5 doses.  Labs today showed slightly improved hemoglobin of 9.2, MCV 100.7.  White count within normal limits.  Platelet count close to normal at 138,000.  Creatinine 1.1, grossly unremarkable CMP.  Iron  studies currently show no evidence of iron  deficiency.  LDH better at 198.  Suboptimal response noted to IV iron , although hemoglobin did improve.  On return visit, we will pursue additional testing to rule out other etiologies for anemia including workup for monoclonal gammopathy.  It is likely that ongoing anemia is from occult GI blood loss from AVMs.  - Continue oral iron  supplements (Fergon) every other day.  RTC as scheduled in late September 2025.

## 2024-03-25 ENCOUNTER — Inpatient Hospital Stay (HOSPITAL_BASED_OUTPATIENT_CLINIC_OR_DEPARTMENT_OTHER)

## 2024-03-25 DIAGNOSIS — K921 Melena: Secondary | ICD-10-CM | POA: Diagnosis not present

## 2024-03-25 DIAGNOSIS — D509 Iron deficiency anemia, unspecified: Secondary | ICD-10-CM | POA: Diagnosis not present

## 2024-03-25 DIAGNOSIS — Z452 Encounter for adjustment and management of vascular access device: Secondary | ICD-10-CM | POA: Diagnosis not present

## 2024-03-25 NOTE — Patient Instructions (Signed)
 PICC Home Care Guide A peripherally inserted central catheter (PICC) is a form of IV access that allows medicines and IV fluids to be quickly put into the blood and spread throughout the body. The PICC is a long, thin, flexible tube (catheter) that is put into a vein in a person's arm or leg. The catheter ends in a large vein just outside the heart called the superior vena cava (SVC). After the PICC is put in, a chest X-ray may be done to make sure that it is in the right place. A PICC may be placed for different reasons, such as: To give medicines and liquid nutrition. To give IV fluids and blood products. To take blood samples often. If there is trouble placing a peripheral intravenous (PIV) catheter. If cared for properly, a PICC can remain in place for many months. Having a PICC can allow you to go home from the hospital sooner and continue treatment at home. Medicines and PICC care can be managed at home by a family member, caregiver, or home health care team. What are the risks? Generally, having a PICC is safe. However, problems may occur, including: A blood clot (thrombus) forming in or at the end of the PICC. A blood clot forming in a vein (deep vein thrombosis) or traveling to the lung (pulmonary embolism). Inflammation of the vein (phlebitis) in which the PICC is placed. Infection at the insertion site or in the blood. Blood infections from central lines, like PICCs, can be serious and often require a hospital stay. PICC malposition, or PICC movement or poor placement. A break or cut in the PICC. Do not use scissors near the PICC. Nerve or tendon irritation or injury during PICC insertion. How to care for your PICC Please follow the specific guidelines provided by your health care provider. Preventing infection You and any caregivers should wash your hands often with soap and water for at least 20 seconds. Wash hands: Before touching the PICC or the infusion device. Before changing a  bandage (dressing). Do not change the dressing unless you have been taught to do so and have shown you are able to change it safely. Flush the PICC as told. Tell your health care provider right away if the PICC is hard to flush or does not flush. Do not use force to flush the PICC. Use clean and germ-free (sterile) supplies only. Keep the supplies in a dry place. Do not reuse needles, syringes, or any other supplies. Reusing supplies can lead to infection. Keep the PICC dressing dry and secure it with tape if the edges stop sticking to your skin. Check your PICC insertion site every day for signs of infection. Check for: Redness, swelling, or pain. Fluid or blood. Warmth. Pus or a bad smell. Preventing other problems Do not use a syringe that is less than 10 mL to flush the PICC. Do not have your blood pressure checked on the arm in which the PICC is placed. Do not ever pull or tug on the PICC. Keep it secured to your arm with tape or a stretch wrap when not in use. Do not take the PICC out yourself. Only a trained health care provider should remove the PICC. Keep pets and children away from your PICC. How to care for your PICC dressing Keep your PICC dressing clean and dry to prevent infection. Do not take baths, swim, or use a hot tub until your health care provider approves. Ask your health care provider if you can take  showers. You may only be allowed to take sponge baths. When you are allowed to shower: Ask your health care provider to teach you how to wrap the PICC. Cover the PICC with clear plastic wrap and tape to keep it dry while showering. Follow instructions from your health care provider about how to take care of your insertion site and dressing. Make sure you: Wash your hands with soap and water for at least 20 seconds before and after you change your dressing. If soap and water are not available, use hand sanitizer. Change your dressing only if taught to do so by your health care  provider. Your PICC dressing needs to be changed if it becomes loose or wet. Leave stitches (sutures), skin glue, or adhesive strips in place. These skin closures may need to stay in place for 2 weeks or longer. If adhesive strip edges start to loosen and curl up, you may trim the loose edges. Do not remove adhesive strips completely unless your health care provider tells you to do that. Follow these instructions at home: Disposal of supplies Throw away any syringes in a disposal container that is meant for sharp items (sharps container). You can buy a sharps container from a pharmacy, or you can make one by using an empty, hard plastic bottle with a lid. Place any used dressings or infusion bags into a plastic bag. Throw that bag in the trash. General instructions  Always carry your PICC identification card or wear a medical alert bracelet. Keep the tube clamped at all times, unless it is being used. Always carry a smooth-edge clamp with you to clamp the PICC if it breaks. Do not use scissors or sharp objects near the tube. You may bend your arm and move it freely. If your PICC is near or at the bend of your elbow, avoid activity with repeated motion at the elbow. Avoid lifting heavy objects as told by your health care provider. Keep all follow-up visits. This is important. You will need to have your PICC dressing changed at least once a week. Contact a health care provider if: You have pain in your arm, ear, face, or teeth. You have a fever or chills. You have redness, swelling, or pain around the insertion site. You have fluid or blood coming from the insertion site. Your insertion site feels warm to the touch. You have pus or a bad smell coming from the insertion site. Your skin feels hard and raised around the insertion site. Your PICC dressing has gotten wet or is coming off and you have not been taught how to change it. Get help right away if: You have problems with your PICC, such as  your PICC: Was tugged or pulled and has partially come out. Do not  push the PICC back in. Cannot be flushed, is hard to flush, or leaks around the insertion site when it is flushed. Makes a flushing sound when it is flushed. Appears to have a hole or tear. Is accidentally pulled all the way out. If this happens, cover the insertion site with a gauze dressing. Do not throw the PICC away. Your health care provider will need to check it to be sure the entire catheter came out. You feel your heart racing or skipping beats, or you have chest pain. You have shortness of breath or trouble breathing. You have swelling, redness, warmth, or pain in the arm in which the PICC is placed. You have a red streak going up your arm that  starts under the PICC dressing. These symptoms may be an emergency. Get help right away. Call 911. Do not wait to see if the symptoms will go away. Do not drive yourself to the hospital. Summary A peripherally inserted central catheter (PICC) is a long, thin, flexible tube (catheter) that is put into a vein in the arm or leg. If cared for properly, a PICC can remain in place for many months. Having a PICC can allow you to go home from the hospital sooner and continue treatment at home. The PICC is inserted using a germ-free (sterile) technique by a specially trained health care provider. Only a trained health care provider should remove it. Do not have your blood pressure checked on the arm in which your PICC is placed. Always keep your PICC identification card with you. This information is not intended to replace advice given to you by your health care provider. Make sure you discuss any questions you have with your health care provider. Document Revised: 01/30/2021 Document Reviewed: 01/30/2021 Elsevier Patient Education  2024 [employer].

## 2024-03-29 ENCOUNTER — Inpatient Hospital Stay: Attending: Oncology

## 2024-03-29 DIAGNOSIS — Z7982 Long term (current) use of aspirin: Secondary | ICD-10-CM | POA: Diagnosis not present

## 2024-03-29 DIAGNOSIS — R0602 Shortness of breath: Secondary | ICD-10-CM | POA: Insufficient documentation

## 2024-03-29 DIAGNOSIS — D500 Iron deficiency anemia secondary to blood loss (chronic): Secondary | ICD-10-CM | POA: Diagnosis not present

## 2024-03-29 DIAGNOSIS — Z853 Personal history of malignant neoplasm of breast: Secondary | ICD-10-CM | POA: Insufficient documentation

## 2024-03-29 DIAGNOSIS — Z79899 Other long term (current) drug therapy: Secondary | ICD-10-CM | POA: Diagnosis not present

## 2024-03-29 DIAGNOSIS — K7460 Unspecified cirrhosis of liver: Secondary | ICD-10-CM | POA: Insufficient documentation

## 2024-03-29 DIAGNOSIS — K760 Fatty (change of) liver, not elsewhere classified: Secondary | ICD-10-CM | POA: Diagnosis not present

## 2024-03-29 DIAGNOSIS — Q2730 Arteriovenous malformation, site unspecified: Secondary | ICD-10-CM | POA: Insufficient documentation

## 2024-03-29 DIAGNOSIS — E119 Type 2 diabetes mellitus without complications: Secondary | ICD-10-CM | POA: Diagnosis not present

## 2024-03-29 DIAGNOSIS — K5521 Angiodysplasia of colon with hemorrhage: Secondary | ICD-10-CM | POA: Insufficient documentation

## 2024-03-29 DIAGNOSIS — Z452 Encounter for adjustment and management of vascular access device: Secondary | ICD-10-CM | POA: Diagnosis not present

## 2024-03-29 NOTE — Patient Instructions (Signed)
 PICC Home Care Guide A peripherally inserted central catheter (PICC) is a form of IV access that allows medicines and IV fluids to be quickly put into the blood and spread throughout the body. The PICC is a long, thin, flexible tube (catheter) that is put into a vein in a person's arm or leg. The catheter ends in a large vein just outside the heart called the superior vena cava (SVC). After the PICC is put in, a chest X-ray may be done to make sure that it is in the right place. A PICC may be placed for different reasons, such as: To give medicines and liquid nutrition. To give IV fluids and blood products. To take blood samples often. If there is trouble placing a peripheral intravenous (PIV) catheter. If cared for properly, a PICC can remain in place for many months. Having a PICC can allow you to go home from the hospital sooner and continue treatment at home. Medicines and PICC care can be managed at home by a family member, caregiver, or home health care team. What are the risks? Generally, having a PICC is safe. However, problems may occur, including: A blood clot (thrombus) forming in or at the end of the PICC. A blood clot forming in a vein (deep vein thrombosis) or traveling to the lung (pulmonary embolism). Inflammation of the vein (phlebitis) in which the PICC is placed. Infection at the insertion site or in the blood. Blood infections from central lines, like PICCs, can be serious and often require a hospital stay. PICC malposition, or PICC movement or poor placement. A break or cut in the PICC. Do not use scissors near the PICC. Nerve or tendon irritation or injury during PICC insertion. How to care for your PICC Please follow the specific guidelines provided by your health care provider. Preventing infection You and any caregivers should wash your hands often with soap and water for at least 20 seconds. Wash hands: Before touching the PICC or the infusion device. Before changing a  bandage (dressing). Do not change the dressing unless you have been taught to do so and have shown you are able to change it safely. Flush the PICC as told. Tell your health care provider right away if the PICC is hard to flush or does not flush. Do not use force to flush the PICC. Use clean and germ-free (sterile) supplies only. Keep the supplies in a dry place. Do not reuse needles, syringes, or any other supplies. Reusing supplies can lead to infection. Keep the PICC dressing dry and secure it with tape if the edges stop sticking to your skin. Check your PICC insertion site every day for signs of infection. Check for: Redness, swelling, or pain. Fluid or blood. Warmth. Pus or a bad smell. Preventing other problems Do not use a syringe that is less than 10 mL to flush the PICC. Do not have your blood pressure checked on the arm in which the PICC is placed. Do not ever pull or tug on the PICC. Keep it secured to your arm with tape or a stretch wrap when not in use. Do not take the PICC out yourself. Only a trained health care provider should remove the PICC. Keep pets and children away from your PICC. How to care for your PICC dressing Keep your PICC dressing clean and dry to prevent infection. Do not take baths, swim, or use a hot tub until your health care provider approves. Ask your health care provider if you can take  showers. You may only be allowed to take sponge baths. When you are allowed to shower: Ask your health care provider to teach you how to wrap the PICC. Cover the PICC with clear plastic wrap and tape to keep it dry while showering. Follow instructions from your health care provider about how to take care of your insertion site and dressing. Make sure you: Wash your hands with soap and water for at least 20 seconds before and after you change your dressing. If soap and water are not available, use hand sanitizer. Change your dressing only if taught to do so by your health care  provider. Your PICC dressing needs to be changed if it becomes loose or wet. Leave stitches (sutures), skin glue, or adhesive strips in place. These skin closures may need to stay in place for 2 weeks or longer. If adhesive strip edges start to loosen and curl up, you may trim the loose edges. Do not remove adhesive strips completely unless your health care provider tells you to do that. Follow these instructions at home: Disposal of supplies Throw away any syringes in a disposal container that is meant for sharp items (sharps container). You can buy a sharps container from a pharmacy, or you can make one by using an empty, hard plastic bottle with a lid. Place any used dressings or infusion bags into a plastic bag. Throw that bag in the trash. General instructions  Always carry your PICC identification card or wear a medical alert bracelet. Keep the tube clamped at all times, unless it is being used. Always carry a smooth-edge clamp with you to clamp the PICC if it breaks. Do not use scissors or sharp objects near the tube. You may bend your arm and move it freely. If your PICC is near or at the bend of your elbow, avoid activity with repeated motion at the elbow. Avoid lifting heavy objects as told by your health care provider. Keep all follow-up visits. This is important. You will need to have your PICC dressing changed at least once a week. Contact a health care provider if: You have pain in your arm, ear, face, or teeth. You have a fever or chills. You have redness, swelling, or pain around the insertion site. You have fluid or blood coming from the insertion site. Your insertion site feels warm to the touch. You have pus or a bad smell coming from the insertion site. Your skin feels hard and raised around the insertion site. Your PICC dressing has gotten wet or is coming off and you have not been taught how to change it. Get help right away if: You have problems with your PICC, such as  your PICC: Was tugged or pulled and has partially come out. Do not  push the PICC back in. Cannot be flushed, is hard to flush, or leaks around the insertion site when it is flushed. Makes a flushing sound when it is flushed. Appears to have a hole or tear. Is accidentally pulled all the way out. If this happens, cover the insertion site with a gauze dressing. Do not throw the PICC away. Your health care provider will need to check it to be sure the entire catheter came out. You feel your heart racing or skipping beats, or you have chest pain. You have shortness of breath or trouble breathing. You have swelling, redness, warmth, or pain in the arm in which the PICC is placed. You have a red streak going up your arm that  starts under the PICC dressing. These symptoms may be an emergency. Get help right away. Call 911. Do not wait to see if the symptoms will go away. Do not drive yourself to the hospital. Summary A peripherally inserted central catheter (PICC) is a long, thin, flexible tube (catheter) that is put into a vein in the arm or leg. If cared for properly, a PICC can remain in place for many months. Having a PICC can allow you to go home from the hospital sooner and continue treatment at home. The PICC is inserted using a germ-free (sterile) technique by a specially trained health care provider. Only a trained health care provider should remove it. Do not have your blood pressure checked on the arm in which your PICC is placed. Always keep your PICC identification card with you. This information is not intended to replace advice given to you by your health care provider. Make sure you discuss any questions you have with your health care provider. Document Revised: 01/30/2021 Document Reviewed: 01/30/2021 Elsevier Patient Education  2024 [employer].

## 2024-04-01 ENCOUNTER — Ambulatory Visit

## 2024-04-01 ENCOUNTER — Inpatient Hospital Stay

## 2024-04-01 DIAGNOSIS — K760 Fatty (change of) liver, not elsewhere classified: Secondary | ICD-10-CM | POA: Diagnosis not present

## 2024-04-01 DIAGNOSIS — Z853 Personal history of malignant neoplasm of breast: Secondary | ICD-10-CM | POA: Diagnosis not present

## 2024-04-01 DIAGNOSIS — R0602 Shortness of breath: Secondary | ICD-10-CM | POA: Diagnosis not present

## 2024-04-01 DIAGNOSIS — I214 Non-ST elevation (NSTEMI) myocardial infarction: Secondary | ICD-10-CM | POA: Diagnosis not present

## 2024-04-01 DIAGNOSIS — K5521 Angiodysplasia of colon with hemorrhage: Secondary | ICD-10-CM | POA: Diagnosis not present

## 2024-04-01 DIAGNOSIS — E785 Hyperlipidemia, unspecified: Secondary | ICD-10-CM | POA: Diagnosis not present

## 2024-04-01 DIAGNOSIS — I10 Essential (primary) hypertension: Secondary | ICD-10-CM

## 2024-04-01 DIAGNOSIS — D509 Iron deficiency anemia, unspecified: Secondary | ICD-10-CM

## 2024-04-01 DIAGNOSIS — Z8673 Personal history of transient ischemic attack (TIA), and cerebral infarction without residual deficits: Secondary | ICD-10-CM | POA: Diagnosis not present

## 2024-04-01 DIAGNOSIS — D500 Iron deficiency anemia secondary to blood loss (chronic): Secondary | ICD-10-CM | POA: Diagnosis not present

## 2024-04-01 DIAGNOSIS — E119 Type 2 diabetes mellitus without complications: Secondary | ICD-10-CM | POA: Diagnosis not present

## 2024-04-01 DIAGNOSIS — Z7982 Long term (current) use of aspirin: Secondary | ICD-10-CM | POA: Diagnosis not present

## 2024-04-01 DIAGNOSIS — K7460 Unspecified cirrhosis of liver: Secondary | ICD-10-CM | POA: Diagnosis not present

## 2024-04-01 DIAGNOSIS — Q2730 Arteriovenous malformation, site unspecified: Secondary | ICD-10-CM | POA: Diagnosis not present

## 2024-04-01 MED ORDER — ATORVASTATIN CALCIUM 80 MG PO TABS: 80.0000 mg | ORAL_TABLET | Freq: Every day | ORAL | 3 refills | Status: AC

## 2024-04-01 MED ORDER — METOPROLOL SUCCINATE ER 25 MG PO TB24: 25.0000 mg | ORAL_TABLET | Freq: Every day | ORAL | 1 refills | Status: DC

## 2024-04-01 MED ORDER — CLOPIDOGREL BISULFATE 75 MG PO TABS
75.0000 mg | ORAL_TABLET | Freq: Every day | ORAL | 2 refills | Status: AC
Start: 2024-04-01 — End: ?

## 2024-04-01 MED ORDER — FERROUS GLUCONATE 324 (38 FE) MG PO TABS: 324.0000 mg | ORAL_TABLET | ORAL | Status: DC

## 2024-04-01 NOTE — Patient Instructions (Signed)
 PICC Home Care Guide A peripherally inserted central catheter (PICC) is a form of IV access that allows medicines and IV fluids to be quickly put into the blood and spread throughout the body. The PICC is a long, thin, flexible tube (catheter) that is put into a vein in a person's arm or leg. The catheter ends in a large vein just outside the heart called the superior vena cava (SVC). After the PICC is put in, a chest X-ray may be done to make sure that it is in the right place. A PICC may be placed for different reasons, such as: To give medicines and liquid nutrition. To give IV fluids and blood products. To take blood samples often. If there is trouble placing a peripheral intravenous (PIV) catheter. If cared for properly, a PICC can remain in place for many months. Having a PICC can allow you to go home from the hospital sooner and continue treatment at home. Medicines and PICC care can be managed at home by a family member, caregiver, or home health care team. What are the risks? Generally, having a PICC is safe. However, problems may occur, including: A blood clot (thrombus) forming in or at the end of the PICC. A blood clot forming in a vein (deep vein thrombosis) or traveling to the lung (pulmonary embolism). Inflammation of the vein (phlebitis) in which the PICC is placed. Infection at the insertion site or in the blood. Blood infections from central lines, like PICCs, can be serious and often require a hospital stay. PICC malposition, or PICC movement or poor placement. A break or cut in the PICC. Do not use scissors near the PICC. Nerve or tendon irritation or injury during PICC insertion. How to care for your PICC Please follow the specific guidelines provided by your health care provider. Preventing infection You and any caregivers should wash your hands often with soap and water for at least 20 seconds. Wash hands: Before touching the PICC or the infusion device. Before changing a  bandage (dressing). Do not change the dressing unless you have been taught to do so and have shown you are able to change it safely. Flush the PICC as told. Tell your health care provider right away if the PICC is hard to flush or does not flush. Do not use force to flush the PICC. Use clean and germ-free (sterile) supplies only. Keep the supplies in a dry place. Do not reuse needles, syringes, or any other supplies. Reusing supplies can lead to infection. Keep the PICC dressing dry and secure it with tape if the edges stop sticking to your skin. Check your PICC insertion site every day for signs of infection. Check for: Redness, swelling, or pain. Fluid or blood. Warmth. Pus or a bad smell. Preventing other problems Do not use a syringe that is less than 10 mL to flush the PICC. Do not have your blood pressure checked on the arm in which the PICC is placed. Do not ever pull or tug on the PICC. Keep it secured to your arm with tape or a stretch wrap when not in use. Do not take the PICC out yourself. Only a trained health care provider should remove the PICC. Keep pets and children away from your PICC. How to care for your PICC dressing Keep your PICC dressing clean and dry to prevent infection. Do not take baths, swim, or use a hot tub until your health care provider approves. Ask your health care provider if you can take  showers. You may only be allowed to take sponge baths. When you are allowed to shower: Ask your health care provider to teach you how to wrap the PICC. Cover the PICC with clear plastic wrap and tape to keep it dry while showering. Follow instructions from your health care provider about how to take care of your insertion site and dressing. Make sure you: Wash your hands with soap and water for at least 20 seconds before and after you change your dressing. If soap and water are not available, use hand sanitizer. Change your dressing only if taught to do so by your health care  provider. Your PICC dressing needs to be changed if it becomes loose or wet. Leave stitches (sutures), skin glue, or adhesive strips in place. These skin closures may need to stay in place for 2 weeks or longer. If adhesive strip edges start to loosen and curl up, you may trim the loose edges. Do not remove adhesive strips completely unless your health care provider tells you to do that. Follow these instructions at home: Disposal of supplies Throw away any syringes in a disposal container that is meant for sharp items (sharps container). You can buy a sharps container from a pharmacy, or you can make one by using an empty, hard plastic bottle with a lid. Place any used dressings or infusion bags into a plastic bag. Throw that bag in the trash. General instructions  Always carry your PICC identification card or wear a medical alert bracelet. Keep the tube clamped at all times, unless it is being used. Always carry a smooth-edge clamp with you to clamp the PICC if it breaks. Do not use scissors or sharp objects near the tube. You may bend your arm and move it freely. If your PICC is near or at the bend of your elbow, avoid activity with repeated motion at the elbow. Avoid lifting heavy objects as told by your health care provider. Keep all follow-up visits. This is important. You will need to have your PICC dressing changed at least once a week. Contact a health care provider if: You have pain in your arm, ear, face, or teeth. You have a fever or chills. You have redness, swelling, or pain around the insertion site. You have fluid or blood coming from the insertion site. Your insertion site feels warm to the touch. You have pus or a bad smell coming from the insertion site. Your skin feels hard and raised around the insertion site. Your PICC dressing has gotten wet or is coming off and you have not been taught how to change it. Get help right away if: You have problems with your PICC, such as  your PICC: Was tugged or pulled and has partially come out. Do not  push the PICC back in. Cannot be flushed, is hard to flush, or leaks around the insertion site when it is flushed. Makes a flushing sound when it is flushed. Appears to have a hole or tear. Is accidentally pulled all the way out. If this happens, cover the insertion site with a gauze dressing. Do not throw the PICC away. Your health care provider will need to check it to be sure the entire catheter came out. You feel your heart racing or skipping beats, or you have chest pain. You have shortness of breath or trouble breathing. You have swelling, redness, warmth, or pain in the arm in which the PICC is placed. You have a red streak going up your arm that  starts under the PICC dressing. These symptoms may be an emergency. Get help right away. Call 911. Do not wait to see if the symptoms will go away. Do not drive yourself to the hospital. Summary A peripherally inserted central catheter (PICC) is a long, thin, flexible tube (catheter) that is put into a vein in the arm or leg. If cared for properly, a PICC can remain in place for many months. Having a PICC can allow you to go home from the hospital sooner and continue treatment at home. The PICC is inserted using a germ-free (sterile) technique by a specially trained health care provider. Only a trained health care provider should remove it. Do not have your blood pressure checked on the arm in which your PICC is placed. Always keep your PICC identification card with you. This information is not intended to replace advice given to you by your health care provider. Make sure you discuss any questions you have with your health care provider. Document Revised: 01/30/2021 Document Reviewed: 01/30/2021 Elsevier Patient Education  2024 [employer].

## 2024-04-01 NOTE — Progress Notes (Signed)
 Patient presented to office for help with Dexcom placement. Patient currently has Dexcom 7 placed on right upper arm. Old Dexcom removed and site cleansed. Due to patient having Lymph node removal in right arm. New Dexcom 7 placed on Right upper arm above original site per her request. Dexcom matched to sensor.

## 2024-04-05 ENCOUNTER — Inpatient Hospital Stay

## 2024-04-05 DIAGNOSIS — Z853 Personal history of malignant neoplasm of breast: Secondary | ICD-10-CM | POA: Diagnosis not present

## 2024-04-05 DIAGNOSIS — K760 Fatty (change of) liver, not elsewhere classified: Secondary | ICD-10-CM | POA: Diagnosis not present

## 2024-04-05 DIAGNOSIS — R0602 Shortness of breath: Secondary | ICD-10-CM | POA: Diagnosis not present

## 2024-04-05 DIAGNOSIS — Q2730 Arteriovenous malformation, site unspecified: Secondary | ICD-10-CM | POA: Diagnosis not present

## 2024-04-05 DIAGNOSIS — D500 Iron deficiency anemia secondary to blood loss (chronic): Secondary | ICD-10-CM | POA: Diagnosis not present

## 2024-04-05 DIAGNOSIS — K7460 Unspecified cirrhosis of liver: Secondary | ICD-10-CM | POA: Diagnosis not present

## 2024-04-05 DIAGNOSIS — E119 Type 2 diabetes mellitus without complications: Secondary | ICD-10-CM | POA: Diagnosis not present

## 2024-04-05 DIAGNOSIS — K5521 Angiodysplasia of colon with hemorrhage: Secondary | ICD-10-CM | POA: Diagnosis not present

## 2024-04-06 ENCOUNTER — Ambulatory Visit: Admitting: Podiatry

## 2024-04-06 ENCOUNTER — Encounter: Payer: Self-pay | Admitting: Podiatry

## 2024-04-06 DIAGNOSIS — L84 Corns and callosities: Secondary | ICD-10-CM

## 2024-04-06 DIAGNOSIS — E1149 Type 2 diabetes mellitus with other diabetic neurological complication: Secondary | ICD-10-CM | POA: Diagnosis not present

## 2024-04-07 DIAGNOSIS — R3915 Urgency of urination: Secondary | ICD-10-CM | POA: Diagnosis not present

## 2024-04-07 DIAGNOSIS — R350 Frequency of micturition: Secondary | ICD-10-CM | POA: Diagnosis not present

## 2024-04-08 ENCOUNTER — Inpatient Hospital Stay

## 2024-04-08 DIAGNOSIS — K5521 Angiodysplasia of colon with hemorrhage: Secondary | ICD-10-CM | POA: Diagnosis not present

## 2024-04-08 DIAGNOSIS — Z853 Personal history of malignant neoplasm of breast: Secondary | ICD-10-CM | POA: Diagnosis not present

## 2024-04-08 DIAGNOSIS — K7460 Unspecified cirrhosis of liver: Secondary | ICD-10-CM | POA: Diagnosis not present

## 2024-04-08 DIAGNOSIS — E119 Type 2 diabetes mellitus without complications: Secondary | ICD-10-CM | POA: Diagnosis not present

## 2024-04-08 DIAGNOSIS — K760 Fatty (change of) liver, not elsewhere classified: Secondary | ICD-10-CM | POA: Diagnosis not present

## 2024-04-08 DIAGNOSIS — Z7982 Long term (current) use of aspirin: Secondary | ICD-10-CM | POA: Diagnosis not present

## 2024-04-08 DIAGNOSIS — R0602 Shortness of breath: Secondary | ICD-10-CM | POA: Diagnosis not present

## 2024-04-08 DIAGNOSIS — D500 Iron deficiency anemia secondary to blood loss (chronic): Secondary | ICD-10-CM | POA: Diagnosis not present

## 2024-04-08 DIAGNOSIS — Q2730 Arteriovenous malformation, site unspecified: Secondary | ICD-10-CM | POA: Diagnosis not present

## 2024-04-08 NOTE — Patient Instructions (Signed)
 PICC Home Care Guide A peripherally inserted central catheter (PICC) is a form of IV access that allows medicines and IV fluids to be quickly put into the blood and spread throughout the body. The PICC is a long, thin, flexible tube (catheter) that is put into a vein in a person's arm or leg. The catheter ends in a large vein just outside the heart called the superior vena cava (SVC). After the PICC is put in, a chest X-ray may be done to make sure that it is in the right place. A PICC may be placed for different reasons, such as: To give medicines and liquid nutrition. To give IV fluids and blood products. To take blood samples often. If there is trouble placing a peripheral intravenous (PIV) catheter. If cared for properly, a PICC can remain in place for many months. Having a PICC can allow you to go home from the hospital sooner and continue treatment at home. Medicines and PICC care can be managed at home by a family member, caregiver, or home health care team. What are the risks? Generally, having a PICC is safe. However, problems may occur, including: A blood clot (thrombus) forming in or at the end of the PICC. A blood clot forming in a vein (deep vein thrombosis) or traveling to the lung (pulmonary embolism). Inflammation of the vein (phlebitis) in which the PICC is placed. Infection at the insertion site or in the blood. Blood infections from central lines, like PICCs, can be serious and often require a hospital stay. PICC malposition, or PICC movement or poor placement. A break or cut in the PICC. Do not use scissors near the PICC. Nerve or tendon irritation or injury during PICC insertion. How to care for your PICC Please follow the specific guidelines provided by your health care provider. Preventing infection You and any caregivers should wash your hands often with soap and water for at least 20 seconds. Wash hands: Before touching the PICC or the infusion device. Before changing a  bandage (dressing). Do not change the dressing unless you have been taught to do so and have shown you are able to change it safely. Flush the PICC as told. Tell your health care provider right away if the PICC is hard to flush or does not flush. Do not use force to flush the PICC. Use clean and germ-free (sterile) supplies only. Keep the supplies in a dry place. Do not reuse needles, syringes, or any other supplies. Reusing supplies can lead to infection. Keep the PICC dressing dry and secure it with tape if the edges stop sticking to your skin. Check your PICC insertion site every day for signs of infection. Check for: Redness, swelling, or pain. Fluid or blood. Warmth. Pus or a bad smell. Preventing other problems Do not use a syringe that is less than 10 mL to flush the PICC. Do not have your blood pressure checked on the arm in which the PICC is placed. Do not ever pull or tug on the PICC. Keep it secured to your arm with tape or a stretch wrap when not in use. Do not take the PICC out yourself. Only a trained health care provider should remove the PICC. Keep pets and children away from your PICC. How to care for your PICC dressing Keep your PICC dressing clean and dry to prevent infection. Do not take baths, swim, or use a hot tub until your health care provider approves. Ask your health care provider if you can take  showers. You may only be allowed to take sponge baths. When you are allowed to shower: Ask your health care provider to teach you how to wrap the PICC. Cover the PICC with clear plastic wrap and tape to keep it dry while showering. Follow instructions from your health care provider about how to take care of your insertion site and dressing. Make sure you: Wash your hands with soap and water for at least 20 seconds before and after you change your dressing. If soap and water are not available, use hand sanitizer. Change your dressing only if taught to do so by your health care  provider. Your PICC dressing needs to be changed if it becomes loose or wet. Leave stitches (sutures), skin glue, or adhesive strips in place. These skin closures may need to stay in place for 2 weeks or longer. If adhesive strip edges start to loosen and curl up, you may trim the loose edges. Do not remove adhesive strips completely unless your health care provider tells you to do that. Follow these instructions at home: Disposal of supplies Throw away any syringes in a disposal container that is meant for sharp items (sharps container). You can buy a sharps container from a pharmacy, or you can make one by using an empty, hard plastic bottle with a lid. Place any used dressings or infusion bags into a plastic bag. Throw that bag in the trash. General instructions  Always carry your PICC identification card or wear a medical alert bracelet. Keep the tube clamped at all times, unless it is being used. Always carry a smooth-edge clamp with you to clamp the PICC if it breaks. Do not use scissors or sharp objects near the tube. You may bend your arm and move it freely. If your PICC is near or at the bend of your elbow, avoid activity with repeated motion at the elbow. Avoid lifting heavy objects as told by your health care provider. Keep all follow-up visits. This is important. You will need to have your PICC dressing changed at least once a week. Contact a health care provider if: You have pain in your arm, ear, face, or teeth. You have a fever or chills. You have redness, swelling, or pain around the insertion site. You have fluid or blood coming from the insertion site. Your insertion site feels warm to the touch. You have pus or a bad smell coming from the insertion site. Your skin feels hard and raised around the insertion site. Your PICC dressing has gotten wet or is coming off and you have not been taught how to change it. Get help right away if: You have problems with your PICC, such as  your PICC: Was tugged or pulled and has partially come out. Do not  push the PICC back in. Cannot be flushed, is hard to flush, or leaks around the insertion site when it is flushed. Makes a flushing sound when it is flushed. Appears to have a hole or tear. Is accidentally pulled all the way out. If this happens, cover the insertion site with a gauze dressing. Do not throw the PICC away. Your health care provider will need to check it to be sure the entire catheter came out. You feel your heart racing or skipping beats, or you have chest pain. You have shortness of breath or trouble breathing. You have swelling, redness, warmth, or pain in the arm in which the PICC is placed. You have a red streak going up your arm that  starts under the PICC dressing. These symptoms may be an emergency. Get help right away. Call 911. Do not wait to see if the symptoms will go away. Do not drive yourself to the hospital. Summary A peripherally inserted central catheter (PICC) is a long, thin, flexible tube (catheter) that is put into a vein in the arm or leg. If cared for properly, a PICC can remain in place for many months. Having a PICC can allow you to go home from the hospital sooner and continue treatment at home. The PICC is inserted using a germ-free (sterile) technique by a specially trained health care provider. Only a trained health care provider should remove it. Do not have your blood pressure checked on the arm in which your PICC is placed. Always keep your PICC identification card with you. This information is not intended to replace advice given to you by your health care provider. Make sure you discuss any questions you have with your health care provider. Document Revised: 01/30/2021 Document Reviewed: 01/30/2021 Elsevier Patient Education  2024 [employer].

## 2024-04-10 NOTE — Progress Notes (Signed)
 Subjective:  Patient ID: Anita Mccormick, female    DOB: [birth year],  MRN: [id number]  Anita Mccormick presents to clinic today for at risk foot care with history of diabetic neuropathy and corn(s) of both feet, callus(es) right foot and painful mycotic nails.  Pain interferes with ambulation. Aggravating factors include wearing enclosed shoe gear. Painful toenails interfere with ambulation. Aggravating factors include wearing enclosed shoe gear. Pain is relieved with periodic professional debridement. Painful corns and calluses are aggravated when weightbearing with and without shoegear. Pain is relieved with periodic professional debridement.  Chief Complaint  Patient presents with   RFC    Diabetic A1c 7.0  PCP Dr. Clotilda Single, [place] 02/15/24   New problem(s): None.   PCP is Single Clotilda SAUNDERS, MD.  Allergies  Allergen Reactions   Bee Venom Swelling   Penicillins Hives   Ace Inhibitors Cough    ????   Chlorthalidone     REACTION: unspecified   Lactose Intolerance (Gi) Diarrhea   Metformin      REACTION: gi side effects   Sulfamethoxazole     REACTION: questionable    Review of Systems: Negative except as noted in the HPI.  Objective: No changes noted in today's physical examination. There were no vitals filed for this visit. Anita Mccormick is a pleasant 78 y.o. female in NAD. AAO x 3.  Vascular Examination: CFT <4 seconds b/l. DP/PT pulses faintly palpable b/l. Skin temperature gradient warm to warm b/l. No pain with calf compression. No ischemia or gangrene. No cyanosis or clubbing noted b/l.    Neurological Examination:Pt has subjective symptoms of neuropathy. Protective sensation decreased with 10 gram monofilament b/l. Vibratory sensation intact b/l.  Dermatological Examination: Pedal skin warm and supple b/l.   No open wounds. No interdigital macerations.  Toenails 1-5 b/l thick, discolored, elongated with subungual debris and pain on dorsal palpation.    Hyperkeratotic  lesion(s) lateral nailfold bilateral 5th digits.  No erythema, no edema, no drainage, no fluctuance. Preulcerative lesion noted right heel. There is visible subdermal hemorrhage. There is no surrounding erythema, no edema, no drainage, no odor, no fluctuance.  Musculoskeletal Examination: Muscle strength 5/5 to all lower extremity muscle groups bilaterally. Adductovarus deformity L 5th toe and R 5th toe. Pes planus deformity noted bilateral LE.  Radiographs: None  Assessment/Plan: 1. Corns and callosities   2. Type II diabetes mellitus with neurological manifestations (HCC)   -Consent given for treatment as described below: -Examined patient. -Continue foot and shoe inspections daily. Monitor blood glucose per PCP/Endocrinologist's recommendations. -Patient to continue soft, supportive shoe gear daily. -Toenails 1-5 b/l were debrided in length and girth with sterile nail nippers and dremel without iatrogenic bleeding.  -Corn(s) dorsal DIPJ of bilateral 5th toes pared utilizing sterile scalpel blade without complication or incident. Total number debrided=2. -Preulcerative lesion pared right heel utilizing sterile scalpel blade. Total number pared=1. -Patient/POA to call should there be question/concern in the interim.   Return in about 3 months (around 07/06/2024).  Delon LITTIE Merlin, DPM       LOCATION: [address], [place] [address]                   Office [phone])  [phone]   [facility] LOCATION: [address] [place], [place] [address] Office [phone]

## 2024-04-11 ENCOUNTER — Ambulatory Visit

## 2024-04-11 ENCOUNTER — Telehealth: Payer: Self-pay

## 2024-04-11 NOTE — Telephone Encounter (Signed)
 Patient came in 9/15 for DexCom placement

## 2024-04-11 NOTE — Telephone Encounter (Signed)
 Copied from CRM #[id number]. Topic: General - Other >> Apr 11, 2024  8:46 AM Macario HERO wrote: Reason for CRM: Patient is calling because she needs assistant putting on her dexcom and setting her meter. Stated she was told to call prior because Dr. Mercer nurse or another nurse in the clinic can assist. Patient is requesting a call back today.

## 2024-04-11 NOTE — Progress Notes (Signed)
 Patient presented in the office for Dexcom replacement

## 2024-04-12 ENCOUNTER — Inpatient Hospital Stay

## 2024-04-12 DIAGNOSIS — M2041 Other hammer toe(s) (acquired), right foot: Secondary | ICD-10-CM | POA: Diagnosis not present

## 2024-04-12 DIAGNOSIS — E1142 Type 2 diabetes mellitus with diabetic polyneuropathy: Secondary | ICD-10-CM | POA: Diagnosis not present

## 2024-04-12 DIAGNOSIS — M2141 Flat foot [pes planus] (acquired), right foot: Secondary | ICD-10-CM | POA: Diagnosis not present

## 2024-04-12 DIAGNOSIS — L84 Corns and callosities: Secondary | ICD-10-CM | POA: Diagnosis not present

## 2024-04-12 DIAGNOSIS — M2142 Flat foot [pes planus] (acquired), left foot: Secondary | ICD-10-CM | POA: Diagnosis not present

## 2024-04-12 DIAGNOSIS — M2042 Other hammer toe(s) (acquired), left foot: Secondary | ICD-10-CM | POA: Diagnosis not present

## 2024-04-12 DIAGNOSIS — K5521 Angiodysplasia of colon with hemorrhage: Secondary | ICD-10-CM | POA: Diagnosis not present

## 2024-04-13 ENCOUNTER — Ambulatory Visit: Admitting: Gastroenterology

## 2024-04-13 ENCOUNTER — Encounter: Payer: Self-pay | Admitting: Gastroenterology

## 2024-04-13 ENCOUNTER — Other Ambulatory Visit (INDEPENDENT_AMBULATORY_CARE_PROVIDER_SITE_OTHER)

## 2024-04-13 VITALS — BP 120/58 | HR 67 | Ht 62.0 in | Wt 141.2 lb

## 2024-04-13 DIAGNOSIS — D500 Iron deficiency anemia secondary to blood loss (chronic): Secondary | ICD-10-CM

## 2024-04-13 DIAGNOSIS — K5520 Angiodysplasia of colon without hemorrhage: Secondary | ICD-10-CM

## 2024-04-13 DIAGNOSIS — K7460 Unspecified cirrhosis of liver: Secondary | ICD-10-CM | POA: Diagnosis not present

## 2024-04-13 DIAGNOSIS — K31819 Angiodysplasia of stomach and duodenum without bleeding: Secondary | ICD-10-CM

## 2024-04-13 LAB — PROTIME-INR
INR: 1.1 ratio — ABNORMAL HIGH (ref 0.8–1.0)
Prothrombin Time: 11.9 s (ref 9.6–13.1)

## 2024-04-13 NOTE — Patient Instructions (Addendum)
 Your provider has requested that you go to the basement level for lab work before leaving today. Press B on the elevator. The lab is located at the first door on the left as you exit the elevator.   You have been scheduled for an abdominal ultrasound at [facility] (1st floor of hospital) on 04/20/24 at 9:30 am. Please arrive 30 minutes prior to your appointment for registration. Make certain not to have anything to eat or drink after midnight prior to your appointment. Should you need to reschedule your appointment, please contact radiology at [phone]. This test typically takes about 30 minutes to perform.  _______________________________________________________  If your blood pressure at your visit was 140/90 or greater, please contact your primary care physician to follow up on this.  _______________________________________________________  If you are age 77 or older, your body mass index should be between 23-30. Your Body mass index is 25.83 kg/m. If this is out of the aforementioned range listed, please consider follow up with your Primary Care Provider.  If you are age 18 or younger, your body mass index should be between 19-25. Your Body mass index is 25.83 kg/m. If this is out of the aformentioned range listed, please consider follow up with your Primary Care Provider.   ________________________________________________________  The [facility] providers would like to encourage you to use MYCHART to communicate with providers for non-urgent requests or questions.  Due to long hold times on the telephone, sending your provider a message by [facility] may be a faster and more efficient way to get a response.  Please allow 48 business hours for a response.  Please remember that this is for non-urgent requests.  _______________________________________________________  [facility] is using a team-based approach to care.  Your team is made up of your doctor and two to  three APPS. Our APPS (Nurse Practitioners and Physician Assistants) work with your physician to ensure care continuity for you. They are fully qualified to address your health concerns and develop a treatment plan. They communicate directly with your gastroenterologist to care for you. Seeing the Advanced Practice Practitioners on your physician's team can help you by facilitating care more promptly, often allowing for earlier appointments, access to diagnostic testing, procedures, and other specialty referrals.

## 2024-04-13 NOTE — Progress Notes (Signed)
 Anita Mccormick Progress Note  Chief Complaint: Iron  deficiency anemia and cirrhosis  Subjective  Prior history Last APP visit 02/18/24:  Patient with 2025 hospital admissions for Mccormick bleeding. During admission in March/April had EGD/colonoscopy which revealed 4 nonbleeding AVMs in the stomach and a single nonbleeding AVM in the duodenum which were treated with APC. Also found to have esophageal candidiasis treated with fluconazole . 2 tubular adenomatous polyps removed from colon. Again admitted May/June for acute on chronic anemia and received 2 units PRBCs but did not undergo repeat endoscopic procedures. She did have a capsule endoscopy with no bleeding source identified though noted to have a relatively fast transit time.   Also MASLD cirrhosis with MELD > 15.  Patient declined referral for transplant (likely not a candidate due to age and comorbidities)  Anita Mccormick follows with hematology, last seen 03/22/2024 (provider note reviewed).  Persistent macrocytic IDA despite oral and periodic IV iron .  They are considering other potential etiologies and contributing factors regarding the anemia.  Discussed the use of AI scribe software for clinical note transcription with the patient, who gave verbal consent to proceed.  History of Present Illness Anita Mccormick is a 78 year old female with cirrhosis and anemia who presents for follow-up.  She has a lifelong history of anemia. Despite achieving normal iron  levels through regular IV iron  treatments every two weeks, she remains anemic. Hematologists monitor her blood counts and iron  levels. She has a PICC line for treatments, but there is consideration of placing a port due to difficulties with IV access. She is concerned about the port placement due to her history of mastectomy on the left side.  Anita Mccormick was here unaccompanied today because her daughter is recovering from surgery.  She has not seen black or bloody stools, denies abdominal pain and says  her bowel habits vary in frequency and consistency.  She denies nausea vomiting or hematemesis.  Appetite variable, weight stable as far she knows.  She has continued to receive IV iron  treatments and goes every 2 weeks for care of the right sided PICC line.  ROS: Cardiovascular:  no chest pain Respiratory: no dyspnea Fatigue Arthralgias Remainder of systems negative except as above The patient's Past Medical, Family and Social History were reviewed and are on file in the EMR. Past Medical History:  Diagnosis Date   Arthritis    Cancer (HCC)    breast left   Cerebrovascular accident [facility])    October 29 2021- Left eye- blind   COLONIC POLYPS, HX OF 12/10/2006   Qualifier: Diagnosis of  By: Tammie MD, Bruce     Diabetes mellitus type II, controlled (HCC) 12/10/2006   Poor control on Januvia  100mg  and glipizide  10mg  XL Lab Results  Component Value Date   HGBA1C 8.3* 11/02/2014        Eczema    Fatty liver disease, nonalcoholic 07/14/2014   Per Dr. Tammie Lab Results  Component Value Date   ALT 31 11/02/2014   AST 57* 11/02/2014   ALKPHOS 104 11/02/2014   BILITOT 1.1 11/02/2014       GERD (gastroesophageal reflux disease)    History of blood transfusion    History of kidney stones 2023   11/28/21 small stone currently, taking flomax    Hypertension    Iron  deficiency anemia, unspecified 05/08/2023   Sepsis (HCC) 09/19/2017   Sepsis due to Escherichia coli (E. coli) (HCC)    Symptomatic anemia 06/06/2021   UTI (urinary tract infection) 09/19/2017  Vaginal discharge 04/01/2022    Past Surgical History:  Procedure Laterality Date   ARTERY BIOPSY Left 11/29/2021   Procedure: LEFT BIOPSY TEMPORAL ARTERY;  Surgeon: Eliza Lonni RAMAN, MD;  Location: [facility] OR;  Service: Vascular;  Laterality: Left;   BIOPSY OF SKIN SUBCUTANEOUS TISSUE AND/OR MUCOUS MEMBRANE  11/10/2023   Procedure: BIOPSY;  Surgeon: Federico Rosario BROCKS, MD;  Location: [facility] ENDOSCOPY;  Service: Gastroenterology;;   CATARACT  EXTRACTION Bilateral    COLONOSCOPY N/A 11/10/2023   Procedure: COLONOSCOPY;  Surgeon: Federico Rosario BROCKS, MD;  Location: [facility] ENDOSCOPY;  Service: Gastroenterology;  Laterality: N/A;   ESOPHAGOGASTRODUODENOSCOPY N/A 11/10/2023   Procedure: EGD (ESOPHAGOGASTRODUODENOSCOPY);  Surgeon: Federico Rosario BROCKS, MD;  Location: [facility] ENDOSCOPY;  Service: Gastroenterology;  Laterality: N/A;   GIVENS CAPSULE STUDY N/A 12/25/2023   Procedure: IMAGING PROCEDURE, Mccormick TRACT, INTRALUMINAL, VIA CAPSULE;  Surgeon: Legrand Victory LITTIE DOUGLAS, MD;  Location: [facility] ENDOSCOPY;  Service: Gastroenterology;  Laterality: N/A;   HOT HEMOSTASIS N/A 11/10/2023   Procedure: EGD, WITH ARGON PLASMA COAGULATION;  Surgeon: Federico Rosario BROCKS, MD;  Location: [facility] ENDOSCOPY;  Service: Gastroenterology;  Laterality: N/A;   LEFT HEART CATH AND CORONARY ANGIOGRAPHY N/A 10/20/2023   Procedure: LEFT HEART CATH AND CORONARY ANGIOGRAPHY;  Surgeon: [place]az[place], Peter M, MD;  Location: [facility] INVASIVE CV LAB;  Service: Cardiovascular;  Laterality: N/A;   MASTECTOMY Left    POLYPECTOMY  11/10/2023   Procedure: POLYPECTOMY, INTESTINE;  Surgeon: Federico Rosario BROCKS, MD;  Location: [facility] ENDOSCOPY;  Service: Gastroenterology;;   TUBAL LIGATION       Objective:  Med list reviewed  Current Outpatient Medications:    ACCU-CHEK GUIDE TEST test strip, 1 each by Other route 3 (three) times daily., Disp: , Rfl:    acetaminophen  (TYLENOL ) 325 MG tablet, Take 2 tablets (650 mg total) by mouth every 6 (six) hours as needed for mild pain (pain score 1-3) (or Fever >/= 101)., Disp: , Rfl:    albuterol  (VENTOLIN  HFA) 108 (90 Base) MCG/ACT inhaler, Inhale 2 puffs into the lungs every 6 (six) hours as needed for wheezing or shortness of breath., Disp: 6.7 g, Rfl: 0   aspirin  EC 81 MG tablet, Take 81 mg by mouth daily. Swallow whole., Disp: , Rfl:    atorvastatin  (LIPITOR ) 80 MG tablet, Take 1 tablet (80 mg total) by mouth daily., Disp: 90 tablet, Rfl: 3   Blood Glucose Monitoring Suppl (ACCU-CHEK GUIDE  ME) w/Device KIT, Inject 1 Units into the skin 3 (three) times daily., Disp: , Rfl:    clopidogrel  (PLAVIX ) 75 MG tablet, Take 1 tablet (75 mg total) by mouth daily., Disp: 90 tablet, Rfl: 2   Continuous Glucose Sensor (DEXCOM G7 SENSOR) MISC, APPLY ONE SENSOR EVERY 21 DAYS, Disp: 2 each, Rfl: 11   Dulaglutide  (TRULICITY ) 1.5 MG/0.5ML SOAJ, Inject 1.5 mg into the skin once a week., Disp: , Rfl:    ferrous gluconate  (FERGON) 324 MG tablet, Take 1 tablet (324 mg total) by mouth every other day., Disp: , Rfl:    glipiZIDE  (GLUCOTROL  XL) 10 MG 24 hr tablet, TAKE 1 TABLET TWICE DAILY, Disp: 180 tablet, Rfl: 3   glucose 4 GM chewable tablet, Chew 1 tablet by mouth as needed for low blood sugar., Disp: , Rfl:    metoprolol  succinate (TOPROL -XL) 25 MG 24 hr tablet, Take 1 tablet (25 mg total) by mouth daily., Disp: 90 tablet, Rfl: 1   Multiple Vitamin (MULTIVITAMIN) tablet, Take 1 tablet by mouth daily., Disp: , Rfl:  nitroGLYCERIN  (NITROSTAT ) 0.4 MG SL tablet, Place 1 tablet (0.4 mg total) under the tongue every 5 (five) minutes as needed for chest pain., Disp: 10 tablet, Rfl: 5   pantoprazole  (PROTONIX ) 40 MG tablet, Take 1 tablet (40 mg total) by mouth daily., Disp: 90 tablet, Rfl: 3   Budeson-Glycopyrrol-Formoterol  (BREZTRI AEROSPHERE IN), Inhale 2 puffs into the lungs 2 (two) times daily. (Patient not taking: Reported on 04/13/2024), Disp: , Rfl:    Vital signs in last 24 hrs: Vitals:   04/13/24 0840  BP: (!) 120/58  Pulse: 67   Wt Readings from Last 3 Encounters:  04/13/24 141 lb 4 oz (64.1 kg)  03/22/24 140 lb 9.6 oz (63.8 kg)  02/18/24 143 lb 4 oz (65 kg)    Physical Exam  Somewhat frail elderly woman.  Ambulates with a walker, needs assistance to get on exam table.  Antalgic gait. HEENT: sclera anicteric, Neck: supple, no thyromegaly, JVD or lymphadenopathy Cardiac: Regular without appreciable murmur,  no peripheral edema Pulm: clear to auscultation bilaterally, normal RR and effort  noted Abdomen: soft, no tenderness, with active bowel sounds. No guarding or palpable hepatosplenomegaly. Skin; warm and dry, no jaundice or rash.  No spider angiomata No asterixis, speech fluent, normal mentation   Labs:     Latest Ref Rng & Units 03/22/2024   10:57 AM 01/25/2024   12:59 PM 01/06/2024    2:27 PM  CBC  WBC 4.0 - 10.5 K/uL 4.6  5.1  5.1   Hemoglobin 12.0 - 15.0 g/dL 9.2  7.7  8.1   Hematocrit 36.0 - 46.0 % 29.7  26.8  27.0   Platelets 150 - 400 K/uL 138  149  170       Latest Ref Rng & Units 03/22/2024   10:57 AM 01/25/2024   12:59 PM 01/06/2024    2:27 PM  CMP  Glucose 70 - 99 mg/dL 820  894  857   BUN 8 - 23 mg/dL 15  13  12    Creatinine 0.44 - 1.00 mg/dL 8.89  8.90  8.99   Sodium 135 - 145 mmol/L 141  144  141   Potassium 3.5 - 5.1 mmol/L 3.9  3.9  4.2   Chloride 98 - 111 mmol/L 107  109  106   CO2 22 - 32 mmol/L 24  26  29    Calcium  8.9 - 10.3 mg/dL 9.9  9.7  9.2   Total Protein 6.5 - 8.1 g/dL 6.6  6.9    Total Bilirubin 0.0 - 1.2 mg/dL 0.7  0.6    Alkaline Phos 38 - 126 U/L 106  120    AST 15 - 41 U/L 27  28    ALT 0 - 44 U/L 23  19     Iron  111, 26%sat and ferritin 76 in 03/22/24 ___________________________________________ Radiologic studies: Last abdominal imaging was a CT angio abdomen and pelvis 12/21/2023 during hospitalization for Mccormick bleed  No AFP on file in this EHR  Lab Results  Component Value Date   INR 1.2 12/23/2023   INR 1.1 10/25/2021   INR 1.2 06/06/2021   MELD 3.0: 17 at 12/25/2023  8:49 AM MELD-Na: 14 at 12/25/2023  8:49 AM Calculated from: Serum Creatinine: 1.03 mg/dL at 4/69/7974  1:50 AM Serum Sodium: 139 mmol/L (Using max of 137 mmol/L) at 12/25/2023  8:49 AM Total Bilirubin: 4.6 mg/dL at 4/70/7974  7:95 PM Serum Albumin : 3.1 g/dL at 4/70/7974  7:95 PM INR(ratio): 1.2 at 12/23/2023  7:00 PM  Age at listing (hypothetical): 25 years Sex: Female at 12/25/2023  8:49  AM   ____________________________________________ Other:   _____________________________________________   Encounter Diagnoses  Name Primary?   Iron  deficiency anemia due to chronic blood loss Yes   Cirrhosis of liver without ascites, unspecified hepatic cirrhosis type (HCC)    Gastric AVM    AVM (arteriovenous malformation) of small bowel, acquired     Assessment and Plan Assessment & Plan Cirrhosis of liver (likely secondary to nonalcoholic fatty liver disease that had been diagnosed about a decade prior) Cirrhosis well-managed, requires regular monitoring due to liver cancer risk. - Schedule liver ultrasound for cancer screening. - Order AFP and INR I agree with no referral to transplant hepatology because she is not a candidate.  Chronic multifactorial anemia (iron  deficiency, chronic disease) Anemia persists despite normal iron  levels, indicating chronic disease contribution. Hematology managing iron  levels, considering port placement due to IV access issues. - Continue hematology follow-up for anemia management. - No current plans for endoscopic procedures, for which she would be high risk.   Will convey lab and ultrasound reports to her in follow-up in 6 months for her cirrhosis or sooner if needed. Encouraged to follow-up at her scheduled appointment soon with hematology for close monitoring of anemia and any additional testing that may be needed.  30 minutes were spent on this encounter (including chart review, history/exam, counseling/coordination of care, and documentation) > 50% of that time was spent on counseling and coordination of care.   Victory LITTIE Brand III

## 2024-04-15 ENCOUNTER — Inpatient Hospital Stay

## 2024-04-15 DIAGNOSIS — K5521 Angiodysplasia of colon with hemorrhage: Secondary | ICD-10-CM | POA: Diagnosis not present

## 2024-04-15 LAB — AFP TUMOR MARKER: AFP-Tumor Marker: 1.7 ng/mL

## 2024-04-15 NOTE — Patient Instructions (Signed)
 PICC Home Care Guide A peripherally inserted central catheter (PICC) is a form of IV access that allows medicines and IV fluids to be quickly put into the blood and spread throughout the body. The PICC is a long, thin, flexible tube (catheter) that is put into a vein in a person's arm or leg. The catheter ends in a large vein just outside the heart called the superior vena cava (SVC). After the PICC is put in, a chest X-ray may be done to make sure that it is in the right place. A PICC may be placed for different reasons, such as: To give medicines and liquid nutrition. To give IV fluids and blood products. To take blood samples often. If there is trouble placing a peripheral intravenous (PIV) catheter. If cared for properly, a PICC can remain in place for many months. Having a PICC can allow you to go home from the hospital sooner and continue treatment at home. Medicines and PICC care can be managed at home by a family member, caregiver, or home health care team. What are the risks? Generally, having a PICC is safe. However, problems may occur, including: A blood clot (thrombus) forming in or at the end of the PICC. A blood clot forming in a vein (deep vein thrombosis) or traveling to the lung (pulmonary embolism). Inflammation of the vein (phlebitis) in which the PICC is placed. Infection at the insertion site or in the blood. Blood infections from central lines, like PICCs, can be serious and often require a hospital stay. PICC malposition, or PICC movement or poor placement. A break or cut in the PICC. Do not use scissors near the PICC. Nerve or tendon irritation or injury during PICC insertion. How to care for your PICC Please follow the specific guidelines provided by your health care provider. Preventing infection You and any caregivers should wash your hands often with soap and water for at least 20 seconds. Wash hands: Before touching the PICC or the infusion device. Before changing a  bandage (dressing). Do not change the dressing unless you have been taught to do so and have shown you are able to change it safely. Flush the PICC as told. Tell your health care provider right away if the PICC is hard to flush or does not flush. Do not use force to flush the PICC. Use clean and germ-free (sterile) supplies only. Keep the supplies in a dry place. Do not reuse needles, syringes, or any other supplies. Reusing supplies can lead to infection. Keep the PICC dressing dry and secure it with tape if the edges stop sticking to your skin. Check your PICC insertion site every day for signs of infection. Check for: Redness, swelling, or pain. Fluid or blood. Warmth. Pus or a bad smell. Preventing other problems Do not use a syringe that is less than 10 mL to flush the PICC. Do not have your blood pressure checked on the arm in which the PICC is placed. Do not ever pull or tug on the PICC. Keep it secured to your arm with tape or a stretch wrap when not in use. Do not take the PICC out yourself. Only a trained health care provider should remove the PICC. Keep pets and children away from your PICC. How to care for your PICC dressing Keep your PICC dressing clean and dry to prevent infection. Do not take baths, swim, or use a hot tub until your health care provider approves. Ask your health care provider if you can take  showers. You may only be allowed to take sponge baths. When you are allowed to shower: Ask your health care provider to teach you how to wrap the PICC. Cover the PICC with clear plastic wrap and tape to keep it dry while showering. Follow instructions from your health care provider about how to take care of your insertion site and dressing. Make sure you: Wash your hands with soap and water for at least 20 seconds before and after you change your dressing. If soap and water are not available, use hand sanitizer. Change your dressing only if taught to do so by your health care  provider. Your PICC dressing needs to be changed if it becomes loose or wet. Leave stitches (sutures), skin glue, or adhesive strips in place. These skin closures may need to stay in place for 2 weeks or longer. If adhesive strip edges start to loosen and curl up, you may trim the loose edges. Do not remove adhesive strips completely unless your health care provider tells you to do that. Follow these instructions at home: Disposal of supplies Throw away any syringes in a disposal container that is meant for sharp items (sharps container). You can buy a sharps container from a pharmacy, or you can make one by using an empty, hard plastic bottle with a lid. Place any used dressings or infusion bags into a plastic bag. Throw that bag in the trash. General instructions  Always carry your PICC identification card or wear a medical alert bracelet. Keep the tube clamped at all times, unless it is being used. Always carry a smooth-edge clamp with you to clamp the PICC if it breaks. Do not use scissors or sharp objects near the tube. You may bend your arm and move it freely. If your PICC is near or at the bend of your elbow, avoid activity with repeated motion at the elbow. Avoid lifting heavy objects as told by your health care provider. Keep all follow-up visits. This is important. You will need to have your PICC dressing changed at least once a week. Contact a health care provider if: You have pain in your arm, ear, face, or teeth. You have a fever or chills. You have redness, swelling, or pain around the insertion site. You have fluid or blood coming from the insertion site. Your insertion site feels warm to the touch. You have pus or a bad smell coming from the insertion site. Your skin feels hard and raised around the insertion site. Your PICC dressing has gotten wet or is coming off and you have not been taught how to change it. Get help right away if: You have problems with your PICC, such as  your PICC: Was tugged or pulled and has partially come out. Do not  push the PICC back in. Cannot be flushed, is hard to flush, or leaks around the insertion site when it is flushed. Makes a flushing sound when it is flushed. Appears to have a hole or tear. Is accidentally pulled all the way out. If this happens, cover the insertion site with a gauze dressing. Do not throw the PICC away. Your health care provider will need to check it to be sure the entire catheter came out. You feel your heart racing or skipping beats, or you have chest pain. You have shortness of breath or trouble breathing. You have swelling, redness, warmth, or pain in the arm in which the PICC is placed. You have a red streak going up your arm that  starts under the PICC dressing. These symptoms may be an emergency. Get help right away. Call 911. Do not wait to see if the symptoms will go away. Do not drive yourself to the hospital. Summary A peripherally inserted central catheter (PICC) is a long, thin, flexible tube (catheter) that is put into a vein in the arm or leg. If cared for properly, a PICC can remain in place for many months. Having a PICC can allow you to go home from the hospital sooner and continue treatment at home. The PICC is inserted using a germ-free (sterile) technique by a specially trained health care provider. Only a trained health care provider should remove it. Do not have your blood pressure checked on the arm in which your PICC is placed. Always keep your PICC identification card with you. This information is not intended to replace advice given to you by your health care provider. Make sure you discuss any questions you have with your health care provider. Document Revised: 01/30/2021 Document Reviewed: 01/30/2021 Elsevier Patient Education  2024 [employer].

## 2024-04-18 ENCOUNTER — Ambulatory Visit: Payer: Self-pay | Admitting: Gastroenterology

## 2024-04-19 ENCOUNTER — Inpatient Hospital Stay

## 2024-04-19 DIAGNOSIS — D500 Iron deficiency anemia secondary to blood loss (chronic): Secondary | ICD-10-CM | POA: Diagnosis not present

## 2024-04-19 DIAGNOSIS — K5521 Angiodysplasia of colon with hemorrhage: Secondary | ICD-10-CM | POA: Diagnosis not present

## 2024-04-19 DIAGNOSIS — K7460 Unspecified cirrhosis of liver: Secondary | ICD-10-CM | POA: Diagnosis not present

## 2024-04-19 DIAGNOSIS — Z7982 Long term (current) use of aspirin: Secondary | ICD-10-CM | POA: Diagnosis not present

## 2024-04-19 DIAGNOSIS — R0602 Shortness of breath: Secondary | ICD-10-CM | POA: Diagnosis not present

## 2024-04-19 DIAGNOSIS — Q2730 Arteriovenous malformation, site unspecified: Secondary | ICD-10-CM | POA: Diagnosis not present

## 2024-04-19 DIAGNOSIS — K760 Fatty (change of) liver, not elsewhere classified: Secondary | ICD-10-CM | POA: Diagnosis not present

## 2024-04-19 DIAGNOSIS — Z853 Personal history of malignant neoplasm of breast: Secondary | ICD-10-CM | POA: Diagnosis not present

## 2024-04-19 DIAGNOSIS — E119 Type 2 diabetes mellitus without complications: Secondary | ICD-10-CM | POA: Diagnosis not present

## 2024-04-19 NOTE — Patient Instructions (Signed)
 PICC Home Care Guide A peripherally inserted central catheter (PICC) is a form of IV access that allows medicines and IV fluids to be quickly put into the blood and spread throughout the body. The PICC is a long, thin, flexible tube (catheter) that is put into a vein in a person's arm or leg. The catheter ends in a large vein just outside the heart called the superior vena cava (SVC). After the PICC is put in, a chest X-ray may be done to make sure that it is in the right place. A PICC may be placed for different reasons, such as: To give medicines and liquid nutrition. To give IV fluids and blood products. To take blood samples often. If there is trouble placing a peripheral intravenous (PIV) catheter. If cared for properly, a PICC can remain in place for many months. Having a PICC can allow you to go home from the hospital sooner and continue treatment at home. Medicines and PICC care can be managed at home by a family member, caregiver, or home health care team. What are the risks? Generally, having a PICC is safe. However, problems may occur, including: A blood clot (thrombus) forming in or at the end of the PICC. A blood clot forming in a vein (deep vein thrombosis) or traveling to the lung (pulmonary embolism). Inflammation of the vein (phlebitis) in which the PICC is placed. Infection at the insertion site or in the blood. Blood infections from central lines, like PICCs, can be serious and often require a hospital stay. PICC malposition, or PICC movement or poor placement. A break or cut in the PICC. Do not use scissors near the PICC. Nerve or tendon irritation or injury during PICC insertion. How to care for your PICC Please follow the specific guidelines provided by your health care provider. Preventing infection You and any caregivers should wash your hands often with soap and water for at least 20 seconds. Wash hands: Before touching the PICC or the infusion device. Before changing a  bandage (dressing). Do not change the dressing unless you have been taught to do so and have shown you are able to change it safely. Flush the PICC as told. Tell your health care provider right away if the PICC is hard to flush or does not flush. Do not use force to flush the PICC. Use clean and germ-free (sterile) supplies only. Keep the supplies in a dry place. Do not reuse needles, syringes, or any other supplies. Reusing supplies can lead to infection. Keep the PICC dressing dry and secure it with tape if the edges stop sticking to your skin. Check your PICC insertion site every day for signs of infection. Check for: Redness, swelling, or pain. Fluid or blood. Warmth. Pus or a bad smell. Preventing other problems Do not use a syringe that is less than 10 mL to flush the PICC. Do not have your blood pressure checked on the arm in which the PICC is placed. Do not ever pull or tug on the PICC. Keep it secured to your arm with tape or a stretch wrap when not in use. Do not take the PICC out yourself. Only a trained health care provider should remove the PICC. Keep pets and children away from your PICC. How to care for your PICC dressing Keep your PICC dressing clean and dry to prevent infection. Do not take baths, swim, or use a hot tub until your health care provider approves. Ask your health care provider if you can take  showers. You may only be allowed to take sponge baths. When you are allowed to shower: Ask your health care provider to teach you how to wrap the PICC. Cover the PICC with clear plastic wrap and tape to keep it dry while showering. Follow instructions from your health care provider about how to take care of your insertion site and dressing. Make sure you: Wash your hands with soap and water for at least 20 seconds before and after you change your dressing. If soap and water are not available, use hand sanitizer. Change your dressing only if taught to do so by your health care  provider. Your PICC dressing needs to be changed if it becomes loose or wet. Leave stitches (sutures), skin glue, or adhesive strips in place. These skin closures may need to stay in place for 2 weeks or longer. If adhesive strip edges start to loosen and curl up, you may trim the loose edges. Do not remove adhesive strips completely unless your health care provider tells you to do that. Follow these instructions at home: Disposal of supplies Throw away any syringes in a disposal container that is meant for sharp items (sharps container). You can buy a sharps container from a pharmacy, or you can make one by using an empty, hard plastic bottle with a lid. Place any used dressings or infusion bags into a plastic bag. Throw that bag in the trash. General instructions  Always carry your PICC identification card or wear a medical alert bracelet. Keep the tube clamped at all times, unless it is being used. Always carry a smooth-edge clamp with you to clamp the PICC if it breaks. Do not use scissors or sharp objects near the tube. You may bend your arm and move it freely. If your PICC is near or at the bend of your elbow, avoid activity with repeated motion at the elbow. Avoid lifting heavy objects as told by your health care provider. Keep all follow-up visits. This is important. You will need to have your PICC dressing changed at least once a week. Contact a health care provider if: You have pain in your arm, ear, face, or teeth. You have a fever or chills. You have redness, swelling, or pain around the insertion site. You have fluid or blood coming from the insertion site. Your insertion site feels warm to the touch. You have pus or a bad smell coming from the insertion site. Your skin feels hard and raised around the insertion site. Your PICC dressing has gotten wet or is coming off and you have not been taught how to change it. Get help right away if: You have problems with your PICC, such as  your PICC: Was tugged or pulled and has partially come out. Do not  push the PICC back in. Cannot be flushed, is hard to flush, or leaks around the insertion site when it is flushed. Makes a flushing sound when it is flushed. Appears to have a hole or tear. Is accidentally pulled all the way out. If this happens, cover the insertion site with a gauze dressing. Do not throw the PICC away. Your health care provider will need to check it to be sure the entire catheter came out. You feel your heart racing or skipping beats, or you have chest pain. You have shortness of breath or trouble breathing. You have swelling, redness, warmth, or pain in the arm in which the PICC is placed. You have a red streak going up your arm that  starts under the PICC dressing. These symptoms may be an emergency. Get help right away. Call 911. Do not wait to see if the symptoms will go away. Do not drive yourself to the hospital. Summary A peripherally inserted central catheter (PICC) is a long, thin, flexible tube (catheter) that is put into a vein in the arm or leg. If cared for properly, a PICC can remain in place for many months. Having a PICC can allow you to go home from the hospital sooner and continue treatment at home. The PICC is inserted using a germ-free (sterile) technique by a specially trained health care provider. Only a trained health care provider should remove it. Do not have your blood pressure checked on the arm in which your PICC is placed. Always keep your PICC identification card with you. This information is not intended to replace advice given to you by your health care provider. Make sure you discuss any questions you have with your health care provider. Document Revised: 01/30/2021 Document Reviewed: 01/30/2021 Elsevier Patient Education  2024 [employer].

## 2024-04-20 ENCOUNTER — Ambulatory Visit (HOSPITAL_COMMUNITY)
Admission: RE | Admit: 2024-04-20 | Discharge: 2024-04-20 | Disposition: A | Discharge status: Home / Self Care | Source: Ambulatory Visit | Attending: Gastroenterology | Admitting: Gastroenterology

## 2024-04-20 ENCOUNTER — Ambulatory Visit

## 2024-04-20 DIAGNOSIS — K7460 Unspecified cirrhosis of liver: Secondary | ICD-10-CM | POA: Insufficient documentation

## 2024-04-20 DIAGNOSIS — E1129 Type 2 diabetes mellitus with other diabetic kidney complication: Secondary | ICD-10-CM

## 2024-04-21 NOTE — Progress Notes (Signed)
 Patient presents for a nurses visit to remove and replace the Dexcom G7 sensor.

## 2024-04-22 ENCOUNTER — Inpatient Hospital Stay

## 2024-04-22 DIAGNOSIS — K5521 Angiodysplasia of colon with hemorrhage: Secondary | ICD-10-CM | POA: Diagnosis not present

## 2024-04-22 NOTE — Patient Instructions (Signed)
 PICC Home Care Guide A peripherally inserted central catheter (PICC) is a form of IV access that allows medicines and IV fluids to be quickly put into the blood and spread throughout the body. The PICC is a long, thin, flexible tube (catheter) that is put into a vein in a person's arm or leg. The catheter ends in a large vein just outside the heart called the superior vena cava (SVC). After the PICC is put in, a chest X-ray may be done to make sure that it is in the right place. A PICC may be placed for different reasons, such as: To give medicines and liquid nutrition. To give IV fluids and blood products. To take blood samples often. If there is trouble placing a peripheral intravenous (PIV) catheter. If cared for properly, a PICC can remain in place for many months. Having a PICC can allow you to go home from the hospital sooner and continue treatment at home. Medicines and PICC care can be managed at home by a family member, caregiver, or home health care team. What are the risks? Generally, having a PICC is safe. However, problems may occur, including: A blood clot (thrombus) forming in or at the end of the PICC. A blood clot forming in a vein (deep vein thrombosis) or traveling to the lung (pulmonary embolism). Inflammation of the vein (phlebitis) in which the PICC is placed. Infection at the insertion site or in the blood. Blood infections from central lines, like PICCs, can be serious and often require a hospital stay. PICC malposition, or PICC movement or poor placement. A break or cut in the PICC. Do not use scissors near the PICC. Nerve or tendon irritation or injury during PICC insertion. How to care for your PICC Please follow the specific guidelines provided by your health care provider. Preventing infection You and any caregivers should wash your hands often with soap and water for at least 20 seconds. Wash hands: Before touching the PICC or the infusion device. Before changing a  bandage (dressing). Do not change the dressing unless you have been taught to do so and have shown you are able to change it safely. Flush the PICC as told. Tell your health care provider right away if the PICC is hard to flush or does not flush. Do not use force to flush the PICC. Use clean and germ-free (sterile) supplies only. Keep the supplies in a dry place. Do not reuse needles, syringes, or any other supplies. Reusing supplies can lead to infection. Keep the PICC dressing dry and secure it with tape if the edges stop sticking to your skin. Check your PICC insertion site every day for signs of infection. Check for: Redness, swelling, or pain. Fluid or blood. Warmth. Pus or a bad smell. Preventing other problems Do not use a syringe that is less than 10 mL to flush the PICC. Do not have your blood pressure checked on the arm in which the PICC is placed. Do not ever pull or tug on the PICC. Keep it secured to your arm with tape or a stretch wrap when not in use. Do not take the PICC out yourself. Only a trained health care provider should remove the PICC. Keep pets and children away from your PICC. How to care for your PICC dressing Keep your PICC dressing clean and dry to prevent infection. Do not take baths, swim, or use a hot tub until your health care provider approves. Ask your health care provider if you can take  showers. You may only be allowed to take sponge baths. When you are allowed to shower: Ask your health care provider to teach you how to wrap the PICC. Cover the PICC with clear plastic wrap and tape to keep it dry while showering. Follow instructions from your health care provider about how to take care of your insertion site and dressing. Make sure you: Wash your hands with soap and water for at least 20 seconds before and after you change your dressing. If soap and water are not available, use hand sanitizer. Change your dressing only if taught to do so by your health care  provider. Your PICC dressing needs to be changed if it becomes loose or wet. Leave stitches (sutures), skin glue, or adhesive strips in place. These skin closures may need to stay in place for 2 weeks or longer. If adhesive strip edges start to loosen and curl up, you may trim the loose edges. Do not remove adhesive strips completely unless your health care provider tells you to do that. Follow these instructions at home: Disposal of supplies Throw away any syringes in a disposal container that is meant for sharp items (sharps container). You can buy a sharps container from a pharmacy, or you can make one by using an empty, hard plastic bottle with a lid. Place any used dressings or infusion bags into a plastic bag. Throw that bag in the trash. General instructions  Always carry your PICC identification card or wear a medical alert bracelet. Keep the tube clamped at all times, unless it is being used. Always carry a smooth-edge clamp with you to clamp the PICC if it breaks. Do not use scissors or sharp objects near the tube. You may bend your arm and move it freely. If your PICC is near or at the bend of your elbow, avoid activity with repeated motion at the elbow. Avoid lifting heavy objects as told by your health care provider. Keep all follow-up visits. This is important. You will need to have your PICC dressing changed at least once a week. Contact a health care provider if: You have pain in your arm, ear, face, or teeth. You have a fever or chills. You have redness, swelling, or pain around the insertion site. You have fluid or blood coming from the insertion site. Your insertion site feels warm to the touch. You have pus or a bad smell coming from the insertion site. Your skin feels hard and raised around the insertion site. Your PICC dressing has gotten wet or is coming off and you have not been taught how to change it. Get help right away if: You have problems with your PICC, such as  your PICC: Was tugged or pulled and has partially come out. Do not  push the PICC back in. Cannot be flushed, is hard to flush, or leaks around the insertion site when it is flushed. Makes a flushing sound when it is flushed. Appears to have a hole or tear. Is accidentally pulled all the way out. If this happens, cover the insertion site with a gauze dressing. Do not throw the PICC away. Your health care provider will need to check it to be sure the entire catheter came out. You feel your heart racing or skipping beats, or you have chest pain. You have shortness of breath or trouble breathing. You have swelling, redness, warmth, or pain in the arm in which the PICC is placed. You have a red streak going up your arm that  starts under the PICC dressing. These symptoms may be an emergency. Get help right away. Call 911. Do not wait to see if the symptoms will go away. Do not drive yourself to the hospital. Summary A peripherally inserted central catheter (PICC) is a long, thin, flexible tube (catheter) that is put into a vein in the arm or leg. If cared for properly, a PICC can remain in place for many months. Having a PICC can allow you to go home from the hospital sooner and continue treatment at home. The PICC is inserted using a germ-free (sterile) technique by a specially trained health care provider. Only a trained health care provider should remove it. Do not have your blood pressure checked on the arm in which your PICC is placed. Always keep your PICC identification card with you. This information is not intended to replace advice given to you by your health care provider. Make sure you discuss any questions you have with your health care provider. Document Revised: 01/30/2021 Document Reviewed: 01/30/2021 Elsevier Patient Education  2024 [employer].

## 2024-04-25 ENCOUNTER — Telehealth: Payer: Self-pay

## 2024-04-25 ENCOUNTER — Telehealth: Payer: Self-pay | Admitting: Oncology

## 2024-04-25 NOTE — Telephone Encounter (Signed)
 Per secure chat conversation with Keene, RN: The patient requested that the Lab, Flush, and OV appointments scheduled for Tuesday, 04/26/2024, be canceled. Keene also requested that a lab appointment be scheduled in addition to the patient's PICC line Flush and Dressing Change appointments on Friday, 04/29/2024, at 12:00?PM and 12:30?PM, respectively. These changes were completed, and Keene was informed. Keene confirmed that she will call the patient to notify her of these updates.

## 2024-04-25 NOTE — Telephone Encounter (Signed)
 PT called with concerns about her dressing change appointment

## 2024-04-26 ENCOUNTER — Inpatient Hospital Stay

## 2024-04-26 ENCOUNTER — Encounter

## 2024-04-26 ENCOUNTER — Other Ambulatory Visit: Payer: Self-pay

## 2024-04-26 ENCOUNTER — Encounter: Payer: Self-pay | Admitting: Oncology

## 2024-04-26 ENCOUNTER — Inpatient Hospital Stay: Admitting: Oncology

## 2024-04-26 VITALS — BP 138/59 | HR 92 | Temp 97.8°F | Resp 18 | Ht 62.0 in | Wt 144.8 lb

## 2024-04-26 DIAGNOSIS — K7460 Unspecified cirrhosis of liver: Secondary | ICD-10-CM | POA: Diagnosis not present

## 2024-04-26 DIAGNOSIS — K5521 Angiodysplasia of colon with hemorrhage: Secondary | ICD-10-CM | POA: Diagnosis not present

## 2024-04-26 DIAGNOSIS — D649 Anemia, unspecified: Secondary | ICD-10-CM

## 2024-04-26 DIAGNOSIS — Z853 Personal history of malignant neoplasm of breast: Secondary | ICD-10-CM | POA: Diagnosis not present

## 2024-04-26 DIAGNOSIS — D500 Iron deficiency anemia secondary to blood loss (chronic): Secondary | ICD-10-CM

## 2024-04-26 DIAGNOSIS — K760 Fatty (change of) liver, not elsewhere classified: Secondary | ICD-10-CM | POA: Diagnosis not present

## 2024-04-26 DIAGNOSIS — R0602 Shortness of breath: Secondary | ICD-10-CM | POA: Diagnosis not present

## 2024-04-26 DIAGNOSIS — Q2730 Arteriovenous malformation, site unspecified: Secondary | ICD-10-CM | POA: Diagnosis not present

## 2024-04-26 DIAGNOSIS — E119 Type 2 diabetes mellitus without complications: Secondary | ICD-10-CM | POA: Diagnosis not present

## 2024-04-26 DIAGNOSIS — Z7982 Long term (current) use of aspirin: Secondary | ICD-10-CM | POA: Diagnosis not present

## 2024-04-26 LAB — FERRITIN: Ferritin: 26 ng/mL (ref 11–307)

## 2024-04-26 LAB — CBC WITH DIFFERENTIAL (CANCER CENTER ONLY)
Abs Immature Granulocytes: 0.01 K/uL (ref 0.00–0.07)
Basophils Absolute: 0 K/uL (ref 0.0–0.1)
Basophils Relative: 0 %
Eosinophils Absolute: 0.5 K/uL (ref 0.0–0.5)
Eosinophils Relative: 14 %
HCT: 23.3 % — ABNORMAL LOW (ref 36.0–46.0)
Hemoglobin: 6.9 g/dL — CL (ref 12.0–15.0)
Immature Granulocytes: 0 %
Lymphocytes Relative: 8 %
Lymphs Abs: 0.3 K/uL — ABNORMAL LOW (ref 0.7–4.0)
MCH: 30.5 pg (ref 26.0–34.0)
MCHC: 29.6 g/dL — ABNORMAL LOW (ref 30.0–36.0)
MCV: 103.1 fL — ABNORMAL HIGH (ref 80.0–100.0)
Monocytes Absolute: 0.3 K/uL (ref 0.1–1.0)
Monocytes Relative: 9 %
Neutro Abs: 2.6 K/uL (ref 1.7–7.7)
Neutrophils Relative %: 69 %
Platelet Count: 146 K/uL — ABNORMAL LOW (ref 150–400)
RBC: 2.26 MIL/uL — ABNORMAL LOW (ref 3.87–5.11)
RDW: 14.9 % (ref 11.5–15.5)
WBC Count: 3.7 K/uL — ABNORMAL LOW (ref 4.0–10.5)
nRBC: 0 % (ref 0.0–0.2)

## 2024-04-26 LAB — CMP (CANCER CENTER ONLY)
ALT: 22 U/L (ref 0–44)
AST: 28 U/L (ref 15–41)
Albumin: 3.7 g/dL (ref 3.5–5.0)
Alkaline Phosphatase: 126 U/L (ref 38–126)
Anion gap: 10 (ref 5–15)
BUN: 12 mg/dL (ref 8–23)
CO2: 23 mmol/L (ref 22–32)
Calcium: 9.2 mg/dL (ref 8.9–10.3)
Chloride: 109 mmol/L (ref 98–111)
Creatinine: 1.12 mg/dL — ABNORMAL HIGH (ref 0.44–1.00)
GFR, Estimated: 50 mL/min — ABNORMAL LOW (ref >60)
Glucose, Bld: 224 mg/dL — ABNORMAL HIGH (ref 70–99)
Potassium: 4 mmol/L (ref 3.5–5.1)
Sodium: 142 mmol/L (ref 135–145)
Total Bilirubin: 0.5 mg/dL (ref 0.0–1.2)
Total Protein: 6.1 g/dL — ABNORMAL LOW (ref 6.5–8.1)

## 2024-04-26 LAB — IRON AND TIBC
Iron: 72 ug/dL (ref 28–170)
Saturation Ratios: 18 % (ref 10.4–31.8)
TIBC: 405 ug/dL (ref 250–450)
UIBC: 333 ug/dL

## 2024-04-26 LAB — PREPARE RBC (CROSSMATCH)

## 2024-04-26 LAB — TSH: TSH: 0.619 u[IU]/mL (ref 0.350–4.500)

## 2024-04-26 LAB — DIRECT ANTIGLOBULIN TEST (NOT AT ARMC)
DAT, IgG: NEGATIVE
DAT, complement: NEGATIVE

## 2024-04-26 NOTE — Assessment & Plan Note (Addendum)
 Chronic anemia with a history of low hemoglobin levels, previously as low as 5.4 in May 2025, improved to 8.1 after transfusions.  Likely due to gastrointestinal bleeding causing slow blood loss.   Currently on oral iron  supplements (Fergon) every other day, tolerated well without constipation.   Occasional black stools and minor blood traces when wiping reported, possibly due to hemorrhoids, though she denies a history of hemorrhoids.  On her consultation with us  on 01/25/2024, labs revealed persistent anemia with hemoglobin of 7.7, MCV 97.  White count and platelet count were normal. Iron  studies continued to show evidence of iron  deficiency with iron  saturation of 5%, iron  decreased to 23.  B12, folate were within normal limits.  LDH borderline at 217.   Given persistent iron  deficiency anemia despite oral iron  supplementation, we proceeded with IV iron  using Venofer  x 5 doses, last dose on 03/01/2024.  Labs today revealed persistent anemia with hemoglobin of 6.9, MCV 103.  White count 3700 with normal differential.  Platelet count normal.  Iron  studies currently show no evidence of iron  deficiency.  TSH normal.  Creatinine 1.12, calcium  9.2.  SPEP, serum free light chains pending.  Given symptomatic anemia, we will proceed with an unit of PRBC transfusion tomorrow.  It is likely that ongoing anemia is from occult GI blood loss from AVMs.  - Continue oral iron  supplements (Fergon) every other day.  RTC in 1 week for repeat labs and follow-up.

## 2024-04-26 NOTE — Progress Notes (Signed)
 [facility]  HEMATOLOGY CLINIC PROGRESS NOTE  PATIENT NAME: Anita Mccormick   MR#: [id number] DOB: 19-Feb-1946  Patient Care Team: Mercer Clotilda SAUNDERS, MD as PCP - General (Family Medicine) [place], Peter M, MD as PCP - Cardiology (Cardiology) Octavia Bruckner, MD as Consulting Physician (Ophthalmology) Lionell Jon DEL, [facility] (Pharmacist)  Date of visit: 04/26/2024   ASSESSMENT & PLAN:   Anita Mccormick is a 78 y.o. lady with a past medical history of acute on chronic anemia due to gastrointestinal bleeding, history of gastric and small bowel AVMs/telangiectasias, liver cirrhosis, CAD s/p PEA arrest and NSTEMI during hospitalization in 09/2023, congestive heart failure, history of CVA, diabetes mellitus, hypertension, Hx of left breast cancer in early 2000s, s/p mastectomy and axillary LN dissection, did not receive adjuvant chemo, was referred to our service in June 2025 for evaluation of iron  deficiency anemia.     Anemia due to chronic blood loss Chronic anemia with a history of low hemoglobin levels, previously as low as 5.4 in May 2025, improved to 8.1 after transfusions.  Likely due to gastrointestinal bleeding causing slow blood loss.   Currently on oral iron  supplements (Fergon) every other day, tolerated well without constipation.   Occasional black stools and minor blood traces when wiping reported, possibly due to hemorrhoids, though she denies a history of hemorrhoids.  On her consultation with us  on 01/25/2024, labs revealed persistent anemia with hemoglobin of 7.7, MCV 97.  White count and platelet count were normal. Iron  studies continued to show evidence of iron  deficiency with iron  saturation of 5%, iron  decreased to 23.  B12, folate were within normal limits.  LDH borderline at 217.   Given persistent iron  deficiency anemia despite oral iron  supplementation, we proceeded with IV iron  using Venofer  x 5 doses, last dose on 03/01/2024.  Labs today revealed  persistent anemia with hemoglobin of 6.9, MCV 103.  White count 3700 with normal differential.  Platelet count normal.  Iron  studies currently show no evidence of iron  deficiency.  TSH normal.  Creatinine 1.12, calcium  9.2.  SPEP, serum free light chains pending.  Given symptomatic anemia, we will proceed with an unit of PRBC transfusion tomorrow.  It is likely that ongoing anemia is from occult GI blood loss from AVMs.  - Continue oral iron  supplements (Fergon) every other day.  RTC in 1 week for repeat labs and follow-up.   Chronic indwelling peripherally inserted central catheter (PICC) management PICC line in place for IV iron  therapy. She expresses desire to remove PICC line due to inconvenience and difficulty with bathing. Discussed alternative of a port-a-cath, which would allow for easier management and less frequent maintenance. Port-a-cath involves a procedure with potential soreness for a day or two post-placement but allows for showering and no need for dressing changes. She is willing to proceed with Port-A-Cath placement.  We will submit a request to IR for the same.  Arteriovenous malformations of gastrointestinal tract Capsule endoscopy previously identified small AVMs in the gastrointestinal tract, which may intermittently bleed and contribute to anemia. She is unaware of any overt bleeding, but chronic blood loss from these AVMs is a concern.  Cirrhosis of liver Recent ultrasound indicates cirrhosis of the liver, previously diagnosed as fatty liver. Denies alcohol use, suggesting non-alcoholic causes such as fatty liver disease or other etiologies. Hepatitis testing was last performed in 2014, and it is unclear if recent testing has been done by the GI team.  I spent a total of 30 minutes  during this encounter with the patient including review of chart and various tests results, discussions about plan of care and coordination of care plan.  I reviewed lab results and outside  records for this visit and discussed relevant results with the patient. Diagnosis, plan of care and treatment options were also discussed in detail with the patient. Opportunity provided to ask questions and answers provided to her apparent satisfaction. Provided instructions to call our clinic with any problems, questions or concerns prior to return visit. I recommended to continue follow-up with PCP and sub-specialists. She verbalized understanding and agreed with the plan. No barriers to learning was detected.  Chinita Patten, MD  04/26/2024 5:38 PM  [facility] A DEPT OF [name] [address] [place] [address] Dept: [phone] Dept Fax: [phone]   CHIEF COMPLAINT/ REASON FOR VISIT:  Follow-up for chronic anemia, likely from occult GI blood loss.  Treated with IV iron   INTERVAL HISTORY:  Discussed the use of AI scribe software for clinical note transcription with the patient, who gave verbal consent to proceed.  History of Present Illness Anita Mccormick is a 78 year old female with anemia who presents with worsening shortness of breath and fatigue.   She experiences worsening shortness of breath and fatigue, becoming breathless with minimal exertion, such as walking. Her hemoglobin level has decreased from 9.2 a month ago to 6.9 today. She has a history of anemia and has been taking iron  tablets every other day. She received an iron  infusion on August 5th, approximately two months ago.  She reports black stools, which she attributes to iron  supplementation. She previously underwent a capsule endoscopy, during which small arteriovenous malformations (AVMs) in the gut were identified. These AVMs were not actively bleeding at the time of the endoscopy, but there is a possibility of intermittent bleeding contributing to her anemia.  She has a history of liver cirrhosis, identified on a recent ultrasound.  She was previously informed of having a fatty liver but was not aware of the cirrhosis diagnosis until now. She denies alcohol consumption and has had hepatitis testing in the past, with the last test conducted in 2014.  She currently has a PICC line on her right side, which is used for blood draws and requires regular cleaning. She is concerned about the risk of infection and the inconvenience of the PICC line, as it requires protection during showers and regular maintenance. She prefers to have it cleaned when she is already at the clinic to avoid additional trips.   SUMMARY OF HEMATOLOGIC HISTORY:  She was hospitalized 5/28-12/27/23 for acute on chronic anemia due to GIB.  Patient given 2 units PRBCs.  Hemoglobin improved to 9.0 at time of discharge.  Patient with history of AVM/angioectasis noted on EGD 4/15.  GI consulted.  Capsule endoscopy 12/25/2023.  No active bleeding appreciated but relatively fast transit time could not limit small findings such as AVMs.  Endoscopic intervention was not amenable.  She was advised to continue iron  supplementation and referral was sent to us  for further evaluation and management of iron  deficiency anemia.   She has undergone endoscopic evaluations and iron  infusions, though she experiences difficulty with venous access during these procedures, requiring multiple attempts to establish an IV line.   Her hemoglobin level improved to 8.1 g/dL in June 2025 from a previous low of 5.4 g/dL following transfusions. She experiences dizziness, shortness of breath, and coughing. No current  chest pain, but ongoing shortness of breath is noted.   She takes Fergon, an iron  supplement, every other day as prescribed since her last hospital visit, and tolerates it well without constipation. She notes black stools and occasional traces of blood when wiping, but denies any history of hemorrhoids, epistaxis, or gum bleeding.   Her past medical history includes breast cancer treated  with surgery approximately 20 years ago, without chemotherapy or radiation. She had implants placed and later removed due to rupture. She does not recall taking hormone-blocking medications post-surgery.   In March 2025, she experienced a rib fracture and lung puncture during CPR, which required a chest tube. She expresses gratitude towards the medical staff involved in her care during that incident.  Chronic anemia with a history of low hemoglobin levels, previously as low as 5.4 in May 2025, improved to 8.1 after transfusions.   Likely due to gastrointestinal bleeding causing slow blood loss.    Currently on oral iron  supplements (Fergon) every other day, tolerating well without constipation.    Occasional black stools and minor blood traces when wiping reported, possibly due to hemorrhoids, though she denies a history of hemorrhoids.   On her consultation with us  on 01/25/2024, labs revealed persistent anemia with hemoglobin of 7.7, MCV 97.  White count and platelet count were normal. Iron  studies continued to show evidence of iron  deficiency with iron  saturation of 5%, iron  decreased to 23.  B12, folate were within normal limits.  LDH borderline at 217.   Given persistent iron  deficiency anemia despite oral iron  supplementation, we proceeded with IV iron  using Venofer  x 5 doses.   I have reviewed the past medical history, past surgical history, social history and family history with the patient and they are unchanged from previous note.  ALLERGIES: She is allergic to bee venom, penicillins, ace inhibitors, chlorthalidone, lactose intolerance (gi), metformin , and sulfamethoxazole.  MEDICATIONS:  Current Outpatient Medications  Medication Sig Dispense Refill   ACCU-CHEK GUIDE TEST test strip 1 each by Other route 3 (three) times daily.     acetaminophen  (TYLENOL ) 325 MG tablet Take 2 tablets (650 mg total) by mouth every 6 (six) hours as needed for mild pain (pain score 1-3) (or Fever >/=  101).     albuterol  (VENTOLIN  HFA) 108 (90 Base) MCG/ACT inhaler Inhale 2 puffs into the lungs every 6 (six) hours as needed for wheezing or shortness of breath. 6.7 g 0   aspirin  EC 81 MG tablet Take 81 mg by mouth daily. Swallow whole.     atorvastatin  (LIPITOR ) 80 MG tablet Take 1 tablet (80 mg total) by mouth daily. 90 tablet 3   Blood Glucose Monitoring Suppl (ACCU-CHEK GUIDE ME) w/Device KIT Inject 1 Units into the skin 3 (three) times daily.     Budeson-Glycopyrrol-Formoterol  (BREZTRI AEROSPHERE IN) Inhale 2 puffs into the lungs 2 (two) times daily.     clopidogrel  (PLAVIX ) 75 MG tablet Take 1 tablet (75 mg total) by mouth daily. 90 tablet 2   Continuous Glucose Sensor (DEXCOM G7 SENSOR) MISC APPLY ONE SENSOR EVERY 21 DAYS 2 each 11   Dulaglutide  (TRULICITY ) 1.5 MG/0.5ML SOAJ Inject 1.5 mg into the skin once a week.     ferrous gluconate  (FERGON) 324 MG tablet Take 1 tablet (324 mg total) by mouth every other day.     glipiZIDE  (GLUCOTROL  XL) 10 MG 24 hr tablet TAKE 1 TABLET TWICE DAILY 180 tablet 3   glucose 4 GM chewable tablet Chew 1 tablet by  mouth as needed for low blood sugar.     metoprolol  succinate (TOPROL -XL) 25 MG 24 hr tablet Take 1 tablet (25 mg total) by mouth daily. 90 tablet 1   Multiple Vitamin (MULTIVITAMIN) tablet Take 1 tablet by mouth daily.     nitroGLYCERIN  (NITROSTAT ) 0.4 MG SL tablet Place 1 tablet (0.4 mg total) under the tongue every 5 (five) minutes as needed for chest pain. 10 tablet 5   pantoprazole  (PROTONIX ) 40 MG tablet Take 1 tablet (40 mg total) by mouth daily. 90 tablet 3   No current facility-administered medications for this visit.     REVIEW OF SYSTEMS:    Review of Systems - Oncology  All other pertinent systems were reviewed with the patient and are negative.  PHYSICAL EXAMINATION:    Onc Performance Status - 04/26/24 1133       ECOG Perf Status   ECOG Perf Status Ambulatory and capable of all selfcare but unable to carry out any work  activities.  Up and about more than 50% of waking hours      KPS SCALE   KPS % SCORE Cares for self, unable to carry on normal activity or to do active work          Vitals:   04/26/24 1127 04/26/24 1128  BP: (!) 139/58 (!) 138/59  Pulse: 92   Resp: 18   Temp: 97.8 F (36.6 C)   SpO2: 100%     Filed Weights   04/26/24 1127  Weight: 144 lb 12.8 oz (65.7 kg)    Physical Exam Constitutional:      General: She is not in acute distress.    Appearance: Normal appearance.  HENT:     Head: Normocephalic and atraumatic.  Cardiovascular:     Rate and Rhythm: Normal rate.  Pulmonary:     Effort: Pulmonary effort is normal. No respiratory distress.  Abdominal:     General: There is no distension.  Musculoskeletal:     Comments: Right arm PICC line in place  Neurological:     General: No focal deficit present.     Mental Status: She is alert and oriented to person, place, and time.  Psychiatric:        Mood and Affect: Mood normal.        Behavior: Behavior normal.     LABORATORY DATA:   I have reviewed the data as listed.  Results for orders placed or performed in visit on 04/26/24  Prepare RBC (crossmatch)  Result Value Ref Range   Order Confirmation      ORDER PROCESSED BY BLOOD BANK Performed at [facility], [address] [place], [place] [address]   Results for orders placed or performed in visit on 04/26/24  TSH  Result Value Ref Range   TSH 0.619 0.350 - 4.500 uIU/mL  Ferritin  Result Value Ref Range   Ferritin 26 11 - 307 ng/mL  Iron  and TIBC  Result Value Ref Range   Iron  72 28 - 170 ug/dL   TIBC 594 749 - 549 ug/dL   Saturation Ratios 18 10.4 - 31.8 %   UIBC 333 ug/dL  CMP (Cancer Center only)  Result Value Ref Range   Sodium 142 135 - 145 mmol/L   Potassium 4.0 3.5 - 5.1 mmol/L   Chloride 109 98 - 111 mmol/L   CO2 23 22 - 32 mmol/L   Glucose, Bld 224 (H) 70 - 99 mg/dL   BUN 12 8 -  23 mg/dL   Creatinine 8.87 (H) 9.55 -  1.00 mg/dL   Calcium  9.2 8.9 - 10.3 mg/dL   Total Protein 6.1 (L) 6.5 - 8.1 g/dL   Albumin  3.7 3.5 - 5.0 g/dL   AST 28 15 - 41 U/L   ALT 22 0 - 44 U/L   Alkaline Phosphatase 126 38 - 126 U/L   Total Bilirubin 0.5 0.0 - 1.2 mg/dL   GFR, Estimated 50 (L) >60 mL/min   Anion gap 10 5 - 15  CBC with Differential (Cancer Center Only)  Result Value Ref Range   WBC Count 3.7 (L) 4.0 - 10.5 K/uL   RBC 2.26 (L) 3.87 - 5.11 MIL/uL   Hemoglobin 6.9 (LL) 12.0 - 15.0 g/dL   HCT 76.6 (L) 63.9 - 53.9 %   MCV 103.1 (H) 80.0 - 100.0 fL   MCH 30.5 26.0 - 34.0 pg   MCHC 29.6 (L) 30.0 - 36.0 g/dL   RDW 85.0 88.4 - 84.4 %   Platelet Count 146 (L) 150 - 400 K/uL   nRBC 0.0 0.0 - 0.2 %   Neutrophils Relative % 69 %   Neutro Abs 2.6 1.7 - 7.7 K/uL   Lymphocytes Relative 8 %   Lymphs Abs 0.3 (L) 0.7 - 4.0 K/uL   Monocytes Relative 9 %   Monocytes Absolute 0.3 0.1 - 1.0 K/uL   Eosinophils Relative 14 %   Eosinophils Absolute 0.5 0.0 - 0.5 K/uL   Basophils Relative 0 %   Basophils Absolute 0.0 0.0 - 0.1 K/uL   Immature Granulocytes 0 %   Abs Immature Granulocytes 0.01 0.00 - 0.07 K/uL  Direct antiglobulin test (not at [facility])  Result Value Ref Range   DAT, complement NEG    DAT, IgG      NEG Performed at [facility], [address] [place], [place] [address]   Type and screen  Result Value Ref Range   ABO/RH(D) B POS    Antibody Screen NEG    Sample Expiration      04/29/2024,2359 Performed at [facility], [address] [place], [place] [address]    Unit Number [id number]    Blood Component Type RBC LR PHER1    Unit division 00    Status of Unit ALLOCATED    Transfusion Status OK TO TRANSFUSE    Crossmatch Result Compatible   BPAM RBC  Result Value Ref Range   Blood Product Unit Number [id number]    PRODUCT CODE [id number]    Unit Type and Rh 7300    Blood Product Expiration Date [id number]       RADIOGRAPHIC STUDIES:  US  Abdomen  Limited RUQ (LIVER/GB) CLINICAL DATA:  Cirrhosis.  HCC screening.  EXAM: ULTRASOUND ABDOMEN LIMITED RIGHT UPPER QUADRANT  COMPARISON:  12/23/2023  FINDINGS: Gallbladder:  Gallstones: None  Sludge: None  Gallbladder Wall: Within normal limits  Pericholecystic fluid: None  Sonographic Murphy's Sign: Negative per technologist  Common bile duct:  Diameter: 4 mm  Liver:  Parenchymal echogenicity: Diffusely coarsened  Contours: Nodular  Lesions: None  Portal vein: Patent.  Hepatopetal flow  Other: None.  IMPRESSION: Cirrhotic liver morphology without focal hepatic lesion.  Electronically Signed   By: Aliene Lloyd M.D.   On: 04/21/2024 14:14    Orders Placed This Encounter  Procedures   CBC with Differential (Cancer Center Only)    Standing Status:   Future    Expected Date:   05/03/2024    Expiration  Date:   04/26/2025   Informed Consent Details: Physician/Practitioner Attestation; Transcribe to consent form and obtain patient signature    Standing Status:   Standing    Number of Occurrences:   1    Physician/Practitioner attestation of informed consent for blood and or blood product transfusion:   I, the physician/practitioner, attest that I have discussed with the patient the benefits, risks, side effects, alternatives, likelihood of achieving goals and potential problems during recovery for the procedure that I have provided informed consent.    Product(s):   All Product(s)   Care order/instruction    Transfuse Parameters    Standing Status:   Future    Number of Occurrences:   1    Expiration Date:   04/26/2025   Prepare RBC (crossmatch)    Standing Status:   Standing    Number of Occurrences:   1    # of Units:   1 unit    Transfusion Indications:   Hemoglobin < 7 gm/dL and symptomatic    Number of Units to Keep Ahead:   NO units ahead    If emergent release call blood bank:   Darryle Law [phone]     Future Appointments  Date Time Provider  Department Center  04/27/2024 10:30 AM [facility] INFUSION [facility] None  04/28/2024 11:20 AM Luke Chiquita SAUNDERS, DO [facility] [address]  05/03/2024 12:30 PM [facility] INFUSION [facility] None  05/03/2024  1:00 PM [facility] PHLEBOTOMIST [facility] None  05/03/2024  1:30 PM Adrick Kestler, MD [facility] None  05/06/2024 12:30 PM [facility] INFUSION [facility] None  05/10/2024 12:30 PM [facility] None  05/18/2024 10:30 AM Mercer Clotilda SAUNDERS, MD [facility] [address]  07/13/2024  9:45 AM Gaynel Delon CROME, DPM [facility]  03/22/2025 12:20 PM Alvia Norleen BIRCH, MD [facility] None    This document was completed utilizing speech recognition software. Grammatical errors, random word insertions, pronoun errors, and incomplete sentences are an occasional consequence of this system due to software limitations, ambient noise, and hardware issues. Any formal questions or concerns about the content, text or information contained within the body of this dictation should be directly addressed to the provider for clarification.

## 2024-04-26 NOTE — Progress Notes (Signed)
 CRITICAL VALUE STICKER  CRITICAL VALUE:Hgb 6.9  RECEIVER (on-site recipient of call):Anita Horne M.  DATE & TIME NOTIFIED: 04/26/2024  MESSENGER (representative from lab):Thersia MOTE  MD NOTIFIED: Pasam  TIME OF NOTIFICATION:1155 am  RESPONSE:  poss. 1 or 2 unit RBCS

## 2024-04-27 ENCOUNTER — Other Ambulatory Visit: Payer: Self-pay

## 2024-04-27 ENCOUNTER — Inpatient Hospital Stay: Attending: Oncology

## 2024-04-27 DIAGNOSIS — K7460 Unspecified cirrhosis of liver: Secondary | ICD-10-CM | POA: Insufficient documentation

## 2024-04-27 DIAGNOSIS — D696 Thrombocytopenia, unspecified: Secondary | ICD-10-CM

## 2024-04-27 DIAGNOSIS — D500 Iron deficiency anemia secondary to blood loss (chronic): Secondary | ICD-10-CM

## 2024-04-27 DIAGNOSIS — K5521 Angiodysplasia of colon with hemorrhage: Secondary | ICD-10-CM | POA: Diagnosis not present

## 2024-04-27 DIAGNOSIS — D649 Anemia, unspecified: Secondary | ICD-10-CM

## 2024-04-27 DIAGNOSIS — R053 Chronic cough: Secondary | ICD-10-CM | POA: Insufficient documentation

## 2024-04-27 LAB — KAPPA/LAMBDA LIGHT CHAINS
Kappa free light chain: 26.5 mg/L — ABNORMAL HIGH (ref 3.3–19.4)
Kappa, lambda light chain ratio: 1.14 (ref 0.26–1.65)
Lambda free light chains: 23.3 mg/L (ref 5.7–26.3)

## 2024-04-27 MED ORDER — SODIUM CHLORIDE 0.9% IV SOLUTION
250.0000 mL | INTRAVENOUS | Status: DC
  Administered 2024-04-27: 100 mL via INTRAVENOUS

## 2024-04-27 MED ORDER — DIPHENHYDRAMINE HCL 25 MG PO CAPS
25.0000 mg | ORAL_CAPSULE | Freq: Once | ORAL | Status: AC
  Administered 2024-04-27: 25 mg via ORAL
  Filled 2024-04-27: qty 1

## 2024-04-27 MED ORDER — ACETAMINOPHEN 325 MG PO TABS
650.0000 mg | ORAL_TABLET | Freq: Once | ORAL | Status: AC
  Administered 2024-04-27: 650 mg via ORAL
  Filled 2024-04-27: qty 2

## 2024-04-27 NOTE — Patient Instructions (Signed)
 PICC Home Care Guide A peripherally inserted central catheter (PICC) is a form of IV access that allows medicines and IV fluids to be quickly put into the blood and spread throughout the body. The PICC is a long, thin, flexible tube (catheter) that is put into a vein in a person's arm or leg. The catheter ends in a large vein just outside the heart called the superior vena cava (SVC). After the PICC is put in, a chest X-ray may be done to make sure that it is in the right place. A PICC may be placed for different reasons, such as: To give medicines and liquid nutrition. To give IV fluids and blood products. To take blood samples often. If there is trouble placing a peripheral intravenous (PIV) catheter. If cared for properly, a PICC can remain in place for many months. Having a PICC can allow you to go home from the hospital sooner and continue treatment at home. Medicines and PICC care can be managed at home by a family member, caregiver, or home health care team. What are the risks? Generally, having a PICC is safe. However, problems may occur, including: A blood clot (thrombus) forming in or at the end of the PICC. A blood clot forming in a vein (deep vein thrombosis) or traveling to the lung (pulmonary embolism). Inflammation of the vein (phlebitis) in which the PICC is placed. Infection at the insertion site or in the blood. Blood infections from central lines, like PICCs, can be serious and often require a hospital stay. PICC malposition, or PICC movement or poor placement. A break or cut in the PICC. Do not use scissors near the PICC. Nerve or tendon irritation or injury during PICC insertion. How to care for your PICC Please follow the specific guidelines provided by your health care provider. Preventing infection You and any caregivers should wash your hands often with soap and water for at least 20 seconds. Wash hands: Before touching the PICC or the infusion device. Before changing a  bandage (dressing). Do not change the dressing unless you have been taught to do so and have shown you are able to change it safely. Flush the PICC as told. Tell your health care provider right away if the PICC is hard to flush or does not flush. Do not use force to flush the PICC. Use clean and germ-free (sterile) supplies only. Keep the supplies in a dry place. Do not reuse needles, syringes, or any other supplies. Reusing supplies can lead to infection. Keep the PICC dressing dry and secure it with tape if the edges stop sticking to your skin. Check your PICC insertion site every day for signs of infection. Check for: Redness, swelling, or pain. Fluid or blood. Warmth. Pus or a bad smell. Preventing other problems Do not use a syringe that is less than 10 mL to flush the PICC. Do not have your blood pressure checked on the arm in which the PICC is placed. Do not ever pull or tug on the PICC. Keep it secured to your arm with tape or a stretch wrap when not in use. Do not take the PICC out yourself. Only a trained health care provider should remove the PICC. Keep pets and children away from your PICC. How to care for your PICC dressing Keep your PICC dressing clean and dry to prevent infection. Do not take baths, swim, or use a hot tub until your health care provider approves. Ask your health care provider if you can take  showers. You may only be allowed to take sponge baths. When you are allowed to shower: Ask your health care provider to teach you how to wrap the PICC. Cover the PICC with clear plastic wrap and tape to keep it dry while showering. Follow instructions from your health care provider about how to take care of your insertion site and dressing. Make sure you: Wash your hands with soap and water for at least 20 seconds before and after you change your dressing. If soap and water are not available, use hand sanitizer. Change your dressing only if taught to do so by your health care  provider. Your PICC dressing needs to be changed if it becomes loose or wet. Leave stitches (sutures), skin glue, or adhesive strips in place. These skin closures may need to stay in place for 2 weeks or longer. If adhesive strip edges start to loosen and curl up, you may trim the loose edges. Do not remove adhesive strips completely unless your health care provider tells you to do that. Follow these instructions at home: Disposal of supplies Throw away any syringes in a disposal container that is meant for sharp items (sharps container). You can buy a sharps container from a pharmacy, or you can make one by using an empty, hard plastic bottle with a lid. Place any used dressings or infusion bags into a plastic bag. Throw that bag in the trash. General instructions  Always carry your PICC identification card or wear a medical alert bracelet. Keep the tube clamped at all times, unless it is being used. Always carry a smooth-edge clamp with you to clamp the PICC if it breaks. Do not use scissors or sharp objects near the tube. You may bend your arm and move it freely. If your PICC is near or at the bend of your elbow, avoid activity with repeated motion at the elbow. Avoid lifting heavy objects as told by your health care provider. Keep all follow-up visits. This is important. You will need to have your PICC dressing changed at least once a week. Contact a health care provider if: You have pain in your arm, ear, face, or teeth. You have a fever or chills. You have redness, swelling, or pain around the insertion site. You have fluid or blood coming from the insertion site. Your insertion site feels warm to the touch. You have pus or a bad smell coming from the insertion site. Your skin feels hard and raised around the insertion site. Your PICC dressing has gotten wet or is coming off and you have not been taught how to change it. Get help right away if: You have problems with your PICC, such as  your PICC: Was tugged or pulled and has partially come out. Do not  push the PICC back in. Cannot be flushed, is hard to flush, or leaks around the insertion site when it is flushed. Makes a flushing sound when it is flushed. Appears to have a hole or tear. Is accidentally pulled all the way out. If this happens, cover the insertion site with a gauze dressing. Do not throw the PICC away. Your health care provider will need to check it to be sure the entire catheter came out. You feel your heart racing or skipping beats, or you have chest pain. You have shortness of breath or trouble breathing. You have swelling, redness, warmth, or pain in the arm in which the PICC is placed. You have a red streak going up your arm that  starts under the PICC dressing. These symptoms may be an emergency. Get help right away. Call 911. Do not wait to see if the symptoms will go away. Do not drive yourself to the hospital. Summary A peripherally inserted central catheter (PICC) is a long, thin, flexible tube (catheter) that is put into a vein in the arm or leg. If cared for properly, a PICC can remain in place for many months. Having a PICC can allow you to go home from the hospital sooner and continue treatment at home. The PICC is inserted using a germ-free (sterile) technique by a specially trained health care provider. Only a trained health care provider should remove it. Do not have your blood pressure checked on the arm in which your PICC is placed. Always keep your PICC identification card with you. This information is not intended to replace advice given to you by your health care provider. Make sure you discuss any questions you have with your health care provider. Document Revised: 01/30/2021 Document Reviewed: 01/30/2021 Elsevier Patient Education  2024 [employer].

## 2024-04-28 ENCOUNTER — Ambulatory Visit: Admitting: Family Medicine

## 2024-04-28 ENCOUNTER — Encounter: Payer: Self-pay | Admitting: Family Medicine

## 2024-04-28 VITALS — Wt 144.0 lb

## 2024-04-28 DIAGNOSIS — Z0000 Encounter for general adult medical examination without abnormal findings: Secondary | ICD-10-CM | POA: Diagnosis not present

## 2024-04-28 LAB — BPAM RBC
Blood Product Expiration Date: 202510302359
ISSUE DATE / TIME: 202510010830
Unit Type and Rh: 202510302359
Unit Type and Rh: 7300

## 2024-04-28 LAB — TYPE AND SCREEN
ABO/RH(D): B POS
Antibody Screen: NEGATIVE
Unit division: 0

## 2024-04-28 NOTE — Patient Instructions (Signed)
 I really enjoyed getting to talk with you today! I am available on Tuesdays and Thursdays for virtual visits if you have any questions or concerns, or if I can be of any further assistance.   CHECKLIST FROM ANNUAL WELLNESS VISIT:  -Follow up (please call to schedule if not scheduled after visit):   -yearly for annual wellness visit with primary care office  Here is a list of your preventive care/health maintenance measures and the plan for each if any are due:  PLAN For any measures below that may be due:    1. Can get the vaccines at the pharmacy - please let us  know if you  Health Maintenance  Topic Date Due   Zoster Vaccines- Shingrix (1 of 2) 10/28/1964   Diabetic kidney evaluation - Urine ACR  04/12/2015   Medicare Annual Wellness (AWV)  10/03/2023   COVID-19 Vaccine (6 - 2025-26 season) 03/28/2024   DTaP/Tdap/Td (2 - Td or Tdap) 07/05/2024 (Originally 09/21/2021)   HEMOGLOBIN A1C  06/25/2024   OPHTHALMOLOGY EXAM  09/20/2024   FOOT EXAM  12/28/2024   Diabetic kidney evaluation - eGFR measurement  04/26/2025   Pneumococcal Vaccine: 50+ Years  Completed   Influenza Vaccine  Completed   DEXA SCAN  Completed   Hepatitis C Screening  Completed   HPV VACCINES  Aged Out   Meningococcal B Vaccine  Aged Out   Mammogram  Discontinued   Colonoscopy  Discontinued    -See a dentist at least yearly  -Get your eyes checked and then per your eye specialist's recommendations  -Other issues addressed today:   -I have included below further information regarding a healthy whole foods based diet, physical activity guidelines for adults, stress management and opportunities for social connections. I hope you find this information useful.    -----------------------------------------------------------------------------------------------------------------------------------------------------------------------------------------------------------------------------------------------------------    NUTRITION: -eat real food: lots of colorful vegetables (half the plate) and fruits -5-7 servings of vegetables and fruits per day (fresh or steamed is best), exp. 2 servings of vegetables with lunch and dinner and 2 servings of fruit per day. Berries and greens such as kale and collards are great choices.  -consume on a regular basis:  fresh fruits, fresh veggies, fish, nuts, seeds, healthy oils (such as olive oil, avocado oil), whole grains (make sure for bread/pasta/crackers/etc., that the first ingredient on label contains the word whole), legumes. -can eat small amounts of dairy and lean meat (no larger than the palm of your hand), but avoid processed meats such as ham, bacon, lunch meat, etc. -drink water -try to avoid fast food and pre-packaged foods, processed meat, ultra processed foods/beverages (donuts, candy, etc.) -most experts advise limiting sodium to < 2300mg  per day, should limit further is any chronic conditions such as high blood pressure, heart disease, diabetes, etc. The American Heart Association advised that < 1500mg  is is ideal -try to avoid foods/beverages that contain any ingredients with names you do not recognize  -try to avoid foods/beverages  with added sugar or sweeteners/sweets  -try to avoid sweet drinks (including diet drinks): soda, juice, Gatorade, sweet tea, power drinks, diet drinks -try to avoid white rice, white bread, pasta (unless whole grain)  EXERCISE GUIDELINES FOR ADULTS: -if you wish to increase your physical activity, do so gradually and with the approval of your doctor -STOP and seek medical care immediately if you have any chest pain, chest discomfort or trouble breathing when starting or  increasing exercise  -move and stretch your body, legs, feet and arms when  sitting for long periods -Physical activity guidelines for optimal health in adults: -get at least 150 minutes per week of moderate exercise (can talk, but not sing); this is about 20-30 minutes of sustained activity 5-7 days per week or two 10-15 minute episodes of sustained activity 5-7 days per week -do some muscle building/resistance training/strength training at least 2 days per week  -balance exercises 3+ days per week:   Stand somewhere where you have something sturdy to hold onto if you lose balance    1) lift up on toes, then back down, start with 5x per day and work up to 20x   2) stand and lift one leg straight out to the side so that foot is a few inches of the floor, start with 5x each side and work up to 20x each side   3) stand on one foot, start with 5 seconds each side and work up to 20 seconds on each side  If you need ideas or help with getting more active:  -Silver sneakers https://tools.silversneakers.com  -Walk with a Doc: http://www.duncan-[email or web address]/  -try to include resistance (weight lifting/strength building) and balance exercises twice per week: or the following link for ideas: http://castillo-powell.com/  BuyDucts.dk  STRESS MANAGEMENT: -can try meditating, or just sitting quietly with deep breathing while intentionally relaxing all parts of your body for 5 minutes daily -if you need further help with stress, anxiety or depression please follow up with your primary doctor or contact the wonderful folks at [facility]: [phone]  SOCIAL CONNECTIONS: -options in [place] if you wish to engage in more social and exercise related activities:  -Silver sneakers https://tools.silversneakers.com  -Walk with a Doc: http://www.duncan-[email or web address]/  -Check out the [facility] Active Adults 50+  section on the [place] of [employer] (hiking clubs, book clubs, cards and games, chess, exercise classes, aquatic classes and much more) - see the website for details: https://www.[place]-.gov/departments/parks-recreation/active-adults50  -YouTube has lots of exercise videos for different ages and abilities as well  -[facility] (a variety of indoor and outdoor inperson activities for adults). [phone]. [address]  -Virtual Online Classes (a variety of topics): see seniorplanet.org or call [phone]  -consider volunteering at a school, hospice center, church, senior center or elsewhere

## 2024-04-28 NOTE — Progress Notes (Signed)
 PATIENT CHECK-IN and HEALTH RISK ASSESSMENT QUESTIONNAIRE:  -completed by phone/video for upcoming Medicare Preventive Visit  Pre-Visit Check-in: 1)Vitals (height, wt, BP, etc) - record in vitals section for visit on day of visit Request home vitals (wt, BP, etc.) and enter into vitals, THEN update Vital Signs SmartPhrase below at the top of the HPI. See below.  2)Review and Update Medications, Allergies PMH, Surgeries, Social history in Epic 3)Hospitalizations in the last year with date/reason? March  2025 4)Review and Update Care Team (patient's specialists) in Epic 5) Complete PHQ9 in Epic  6) Complete Fall Screening in Epic 7)Review all Health Maintenance Due and order if not done.  Medicare Wellness Patient Questionnaire:  Answer theses question about your habits: How often do you have a drink containing alcohol?never How many drinks containing alcohol do you have on a typical day when you are drinking?N/A How often do you have six or more drinks on one occasion?N/A Have you ever smoked?yes Quit date if applicable? 2025  How many packs a day do/did you smoke? 1 ppd Do you use smokeless tobacco? no Do you use an illicit drugs?no On average, how many days per week do you engage in moderate to strenuous exercise (like a brisk walk)? Stepper 5 days a week On average, how many minutes do you engage in exercise at this level?15 minutes, in addition she sometimes walks outside Are you sexually active? Not currently Number of partners? 1 Typical breakfast-oatmeal, bacon and diced apples Typical lunch-roast beef slider, BBQ sandwich or hamburger Typical dinner-meat, veggies, occasional starch Typical snacks: peanut butter crackers, Cheez its, ice cream rarely  Beverages: usually water, ice and Diet Pepsi or ginger ale once in a while  Answer theses question about your everyday activities: Can you perform most household chores?no Are you deaf or have significant trouble hearing?  sometimes Do you feel that you have a problem with memory?sometimes Do you feel safe at home?yes Last dentist visit?April or May 2025 8. Do you have any difficulty performing your everyday activities?yes Are you having any difficulty walking, taking medications on your own, and or difficulty managing daily home needs?yes Do you have difficulty walking or climbing stairs?yes-uses walker Do you have difficulty dressing or bathing? no Do you have difficulty doing errands alone such as visiting a doctor's office or shopping? no Do you currently have any difficulty preparing food and eating?yes Do you currently have any difficulty using the toilet?no  Do you have any difficulty managing your finances? no Do you have any difficulties with housekeeping of managing your housekeeping?no   Do you have Advanced Directives in place (Living Will, Healthcare Power or Attorney)? Reports has HCPOA   Last eye Exam and location?1 month ago   Do you currently use prescribed or non-prescribed narcotic or opioid pain medications?no  Do you have a history or close family history of breast, ovarian, tubal or peritoneal cancer or a family member with BRCA (breast cancer susceptibility 1 and 2) gene mutations? Personal history of breast cancer, maternal aunt had breast cancer (deceased)  Request home vitals (wt, BP, etc.) and enter into vitals, THEN update Vital Signs SmartPhrase below at the top of the HPI. See below. -see vital signs  Nurse/Assistant Credentials/time stamp: Idell Arlean Christyne REYNOLDS    ----------------------------------------------------------------------------------------------------------------------------------------------------------------------------------------------------------------------  Because this visit was a virtual/telehealth visit, some criteria may be missing or patient reported. Any vitals not documented were not able to be obtained and vitals that have been documented are  patient reported.    MEDICARE  ANNUAL PREVENTIVE VISIT WITH PROVIDER: (Welcome to [employer], initial annual wellness or annual wellness exam)  Virtual Visit via Phone Note  I connected with Anita Mccormick on 04/28/24 by phone and verified that I am speaking with the correct person using two identifiers.  Location patient: home Location provider:work or home office Persons participating in the virtual visit: patient, provider  Concerns and/or follow up today: reports is doing well - no new concerns   See HM section in Epic for other details of completed HM.    ROS: negative for report of fevers, unintentional weight loss, vision changes, vision loss, hearing loss or change, chest pain, sob, hemoptysis, melena, hematochezia, hematuria, falls, bleeding or bruising, thoughts of suicide or self harm, memory loss  Patient-completed extensive health risk assessment - reviewed and discussed with the patient: See Health Risk Assessment completed with patient prior to the visit either above or in recent phone note. This was reviewed in detailed with the patient today and appropriate recommendations, orders and referrals were placed as needed per Summary below and patient instructions.   Review of Medical History: -PMH, PSH, Family History and current specialty and care providers reviewed and updated and listed below   Patient Care Team: Mercer Clotilda SAUNDERS, MD as PCP - General (Family Medicine) [place]azil[place], Peter M, MD as PCP - Cardiology (Cardiology) Octavia Bruckner, MD as Consulting Physician (Ophthalmology) Lionell Jon DEL, [facility] (Pharmacist)   Past Medical History:  Diagnosis Date   Arthritis    Cancer [facility])    breast left   Cerebrovascular accident [facility])    October 29 2021- Left eye- blind   COLONIC POLYPS, HX OF 12/10/2006   Qualifier: Diagnosis of  By: Tammie MD, Bruce     Diabetes mellitus type II, controlled (HCC) 12/10/2006   Poor control on Januvia  100mg  and glipizide  10mg  XL Lab  Results  Component Value Date   HGBA1C 8.3* 11/02/2014        Eczema    Fatty liver disease, nonalcoholic 07/14/2014   Per Dr. Tammie Lab Results  Component Value Date   ALT 31 11/02/2014   AST 57* 11/02/2014   ALKPHOS 104 11/02/2014   BILITOT 1.1 11/02/2014       GERD (gastroesophageal reflux disease)    History of blood transfusion    History of kidney stones 2023   11/28/21 small stone currently, taking flomax    Hypertension    Iron  deficiency anemia, unspecified 05/08/2023   Sepsis (HCC) 09/19/2017   Sepsis due to Escherichia coli (E. coli) (HCC)    Symptomatic anemia 06/06/2021   UTI (urinary tract infection) 09/19/2017   Vaginal discharge 04/01/2022    Past Surgical History:  Procedure Laterality Date   ARTERY BIOPSY Left 11/29/2021   Procedure: LEFT BIOPSY TEMPORAL ARTERY;  Surgeon: Eliza Bruckner RAMAN, MD;  Location: [facility] OR;  Service: Vascular;  Laterality: Left;   BIOPSY OF SKIN SUBCUTANEOUS TISSUE AND/OR MUCOUS MEMBRANE  11/10/2023   Procedure: BIOPSY;  Surgeon: Federico Rosario BROCKS, MD;  Location[facility] ENDOSCOPY;  Service: Gastroenterology;;   CATARACT EXTRACTION Bilateral    COLONOSCOPY N/A 11/10/2023   Procedure: COLONOSCOPY;  Surgeon: Federico Rosario BROCKS, MD;  Location: [facility] ENDOSCOPY;  Service: Gastroenterology;  Laterality: N/A;   ESOPHAGOGASTRODUODENOSCOPY N/A 11/10/2023   Procedure: EGD (ESOPHAGOGASTRODUODENOSCOPY);  Surgeon: Federico Rosario BROCKS, MD;  Location: [facility] ENDOSCOPY;  Service: Gastroenterology;  Laterality: N/A;   GIVENS CAPSULE STUDY N/A 12/25/2023   Procedure: IMAGING PROCEDURE, GI TRACT, INTRALUMINAL, VIA CAPSULE;  Surgeon: Legrand Victory CROME III,  MD;  Location: [facility] ENDOSCOPY;  Service: Gastroenterology;  Laterality: N/A;   HOT HEMOSTASIS N/A 11/10/2023   Procedure: EGD, WITH ARGON PLASMA COAGULATION;  Surgeon: Federico Rosario BROCKS, MD;  Location: [facility] ENDOSCOPY;  Service: Gastroenterology;  Laterality: N/A;   LEFT HEART CATH AND CORONARY ANGIOGRAPHY N/A 10/20/2023   Procedure: LEFT HEART CATH  AND CORONARY ANGIOGRAPHY;  Surgeon: Swaziland, Peter M, MD;  Location: [facility] INVASIVE CV LAB;  Service: Cardiovascular;  Laterality: N/A;   MASTECTOMY Left    POLYPECTOMY  11/10/2023   Procedure: POLYPECTOMY, INTESTINE;  Surgeon: Federico Rosario BROCKS, MD;  Location: [facility] ENDOSCOPY;  Service: Gastroenterology;;   TUBAL LIGATION      Social History   Socioeconomic History   Marital status: Married    Spouse name: Not on file   Number of children: 4   Years of education: Not on file   Highest education level: Not on file  Occupational History   Not on file  Tobacco Use   Smoking status: Former    Current packs/day: 0.20    Average packs/day: 0.2 packs/day for 10.0 years (2.0 ttl pk-yrs)    Types: Cigarettes   Smokeless tobacco: Never  Vaping Use   Vaping status: Never Used  Substance and Sexual Activity   Alcohol use: Not Currently   Drug use: No   Sexual activity: Not Currently  Other Topics Concern   Not on file  Social History Narrative   Married (husband Miquel). 4 children. 11 grandkids. 1 greatgrandchild.       Still working as homemaker      Hobbies: playing cards, board games, cooking, Print production planner   Social Drivers of Corporate investment banker Strain: Low Risk  (04/28/2024)   Overall Financial Resource Strain (CARDIA)    Difficulty of Paying Living Expenses: Not hard at all  Food Insecurity: No Food Insecurity (04/28/2024)   Hunger Vital Sign    Worried About Running Out of Food in the Last Year: Never true    Ran Out of Food in the Last Year: Never true  Transportation Needs: No Transportation Needs (04/28/2024)   PRAPARE - Administrator, Civil Service (Medical): No    Lack of Transportation (Non-Medical): No  Physical Activity: Inactive (04/28/2024)   Exercise Vital Sign    Days of Exercise per Week: 7 days    Minutes of Exercise per Session: 0 min  Stress: No Stress Concern Present (04/28/2024)   [employer] of Occupational Health - Occupational Stress  Questionnaire    Feeling of Stress: Only a little  Social Connections: Moderately Integrated (04/28/2024)   Social Connection and Isolation Panel    Frequency of Communication with Friends and Family: More than three times a week    Frequency of Social Gatherings with Friends and Family: More than three times a week    Attends Religious Services: More than 4 times per year    Active Member of [employer] or Organizations: No    Attends Banker Meetings: Never    Marital Status: Married  Catering manager Violence: Not At Risk (04/28/2024)   Humiliation, Afraid, Rape, and Kick questionnaire    Fear of Current or Ex-Partner: No    Emotionally Abused: No    Physically Abused: No    Sexually Abused: No    Family History  Problem Relation Age of Onset   Stroke Mother    Cancer Father        prostate   Diabetes Sister  Pancreatic cancer Sister     Current Outpatient Medications on File Prior to Visit  Medication Sig Dispense Refill   ACCU-CHEK GUIDE TEST test strip 1 each by Other route 3 (three) times daily.     acetaminophen  (TYLENOL ) 325 MG tablet Take 2 tablets (650 mg total) by mouth every 6 (six) hours as needed for mild pain (pain score 1-3) (or Fever >/= 101).     albuterol  (VENTOLIN  HFA) 108 (90 Base) MCG/ACT inhaler Inhale 2 puffs into the lungs every 6 (six) hours as needed for wheezing or shortness of breath. 6.7 g 0   aspirin  EC 81 MG tablet Take 81 mg by mouth daily. Swallow whole.     atorvastatin  (LIPITOR ) 80 MG tablet Take 1 tablet (80 mg total) by mouth daily. 90 tablet 3   Blood Glucose Monitoring Suppl (ACCU-CHEK GUIDE ME) w/Device KIT Inject 1 Units into the skin 3 (three) times daily.     Budeson-Glycopyrrol-Formoterol  (BREZTRI AEROSPHERE IN) Inhale 2 puffs into the lungs 2 (two) times daily.     clopidogrel  (PLAVIX ) 75 MG tablet Take 1 tablet (75 mg total) by mouth daily. 90 tablet 2   Continuous Glucose Sensor (DEXCOM G7 SENSOR) MISC APPLY ONE SENSOR EVERY  21 DAYS 2 each 11   Dulaglutide  (TRULICITY ) 1.5 MG/0.5ML SOAJ Inject 1.5 mg into the skin once a week.     ferrous gluconate  (FERGON) 324 MG tablet Take 1 tablet (324 mg total) by mouth every other day.     glipiZIDE  (GLUCOTROL  XL) 10 MG 24 hr tablet TAKE 1 TABLET TWICE DAILY 180 tablet 3   glucose 4 GM chewable tablet Chew 1 tablet by mouth as needed for low blood sugar.     metoprolol  succinate (TOPROL -XL) 25 MG 24 hr tablet Take 1 tablet (25 mg total) by mouth daily. 90 tablet 1   Multiple Vitamin (MULTIVITAMIN) tablet Take 1 tablet by mouth daily.     nitroGLYCERIN  (NITROSTAT ) 0.4 MG SL tablet Place 1 tablet (0.4 mg total) under the tongue every 5 (five) minutes as needed for chest pain. 10 tablet 5   pantoprazole  (PROTONIX ) 40 MG tablet Take 1 tablet (40 mg total) by mouth daily. 90 tablet 3   No current facility-administered medications on file prior to visit.    Allergies  Allergen Reactions   Bee Venom Swelling   Penicillins Hives   Ace Inhibitors Cough    ????   Chlorthalidone     REACTION: unspecified   Lactose Intolerance (Gi) Diarrhea   Metformin      REACTION: gi side effects   Sulfamethoxazole     REACTION: questionable       Physical Exam Vitals requested from patient and listed below if patient had equipment and was able to obtain at home for this virtual visit: There were no vitals filed for this visit. Estimated body mass index is 26.34 kg/m as calculated from the following:   Height as of 04/26/24: 5' 2 (1.575 m).   Weight as of this encounter: 144 lb (65.3 kg).  EKG (optional): deferred due to virtual visit  GENERAL: alert, oriented, no acute distress detected, full vision exam deferred due to pandemic and/or virtual encounter  PSYCH/NEURO: pleasant and cooperative, no obvious depression or anxiety, speech and thought processing grossly intact, Cognitive function grossly intact  Flowsheet Row Clinical Support from 04/28/2024 in [facility]  [facility] at [facility] Total Score 7        04/28/2024   10:44 AM 02/23/2024  12:36 PM 02/19/2024   11:00 AM 02/16/2024   10:39 AM 01/25/2024   12:16 PM  Depression screen PHQ 2/9  Decreased Interest 0 0 0 0 0  Down, Depressed, Hopeless 0 0 0 0 0  PHQ - 2 Score 0 0 0 0 0  Altered sleeping 0      Tired, decreased energy 3      Change in appetite 0      Feeling bad or failure about yourself  1      Trouble concentrating 0      Moving slowly or fidgety/restless 3      Suicidal thoughts 0      PHQ-9 Score 7           10/03/2022   11:40 AM 05/22/2023   11:29 AM 07/06/2023    8:52 AM 11/18/2023    2:52 PM 04/28/2024   10:43 AM  Fall Risk  Falls in the past year? 1 0 0  1  Was there an injury with Fall? 1  0 0 1  Was there an injury with Fall? - Comments Skin tear to rt knee. Followed by medical attention      Fall Risk Category Calculator 2  0  2  Patient at Risk for Falls Due to No Fall Risks No Fall Risks   Impaired balance/gait;Impaired mobility  Fall risk Follow up Falls prevention discussed Falls evaluation completed Falls evaluation completed Falls evaluation completed Falls evaluation completed  Had heart attack and fell - but o/w reports no falls   SUMMARY AND PLAN:  Encounter for Medicare annual wellness exam  Discussed applicable health maintenance/preventive health measures and advised and referred or ordered per patient preferences: -discussed vaccines due recs and risks, she plans to get the covid and flu vaccines at the pharmacy -plans to get the kidney exam at the office Health Maintenance  Topic Date Due   Zoster Vaccines- Shingrix (1 of 2) 10/28/1964   Diabetic kidney evaluation - Urine ACR  04/12/2015   COVID-19 Vaccine (6 - 2025-26 season) 03/28/2024   DTaP/Tdap/Td (2 - Td or Tdap) 07/05/2024 (Originally 09/21/2021)   HEMOGLOBIN A1C  06/25/2024   OPHTHALMOLOGY EXAM  09/20/2024   FOOT EXAM  12/28/2024   Diabetic kidney evaluation - eGFR measurement   04/26/2025   Medicare Annual Wellness (AWV)  04/28/2025   Pneumococcal Vaccine: 50+ Years  Completed   Influenza Vaccine  Completed   DEXA SCAN  Completed   Hepatitis C Screening  Completed   HPV VACCINES  Aged Out   Meningococcal B Vaccine  Aged Out   Mammogram  Discontinued   Colonoscopy  Discontinued      Education and counseling on the following was provided based on the above review of health and a plan/checklist for the patient, along with additional information discussed, was provided for the patient in the patient instructions :  -Provided counseling and plan for increased risk of falling if applicable per above screening. Discussed safe balance exercises that can be done at home to improve balance and discussed exercise guidelines for adults with include balance exercises at least 3 days per week.  -Advised and counseled on a healthy lifestyle -Reviewed patient's current diet. Advised and counseled on a whole foods based healthy diet. A summary of a healthy diet was provided in the Patient Instructions.  -reviewed patient's current physical activity level and discussed exercise guidelines for adults. Discussed ideas for safe exercise at home to assist in meeting exercise guideline recommendations in  a safe and healthy way.  -Advise yearly dental visits at minimum and regular eye exams  Follow up: see patient instructions     Patient Instructions  I really enjoyed getting to talk with you today! I am available on Tuesdays and Thursdays for virtual visits if you have any questions or concerns, or if I can be of any further assistance.   CHECKLIST FROM ANNUAL WELLNESS VISIT:  -Follow up (please call to schedule if not scheduled after visit):   -yearly for annual wellness visit with primary care office  Here is a list of your preventive care/health maintenance measures and the plan for each if any are due:  PLAN For any measures below that may be due:    1. Can get the  vaccines at the pharmacy - please let us  know if you  Health Maintenance  Topic Date Due   Zoster Vaccines- Shingrix (1 of 2) 10/28/1964   Diabetic kidney evaluation - Urine ACR  04/12/2015   Medicare Annual Wellness (AWV)  10/03/2023   COVID-19 Vaccine (6 - 2025-26 season) 03/28/2024   DTaP/Tdap/Td (2 - Td or Tdap) 07/05/2024 (Originally 09/21/2021)   HEMOGLOBIN A1C  06/25/2024   OPHTHALMOLOGY EXAM  09/20/2024   FOOT EXAM  12/28/2024   Diabetic kidney evaluation - eGFR measurement  04/26/2025   Pneumococcal Vaccine: 50+ Years  Completed   Influenza Vaccine  Completed   DEXA SCAN  Completed   Hepatitis C Screening  Completed   HPV VACCINES  Aged Out   Meningococcal B Vaccine  Aged Out   Mammogram  Discontinued   Colonoscopy  Discontinued    -See a dentist at least yearly  -Get your eyes checked and then per your eye specialist's recommendations  -Other issues addressed today:   -I have included below further information regarding a healthy whole foods based diet, physical activity guidelines for adults, stress management and opportunities for social connections. I hope you find this information useful.   -----------------------------------------------------------------------------------------------------------------------------------------------------------------------------------------------------------------------------------------------------------    NUTRITION: -eat real food: lots of colorful vegetables (half the plate) and fruits -5-7 servings of vegetables and fruits per day (fresh or steamed is best), exp. 2 servings of vegetables with lunch and dinner and 2 servings of fruit per day. Berries and greens such as kale and collards are great choices.  -consume on a regular basis:  fresh fruits, fresh veggies, fish, nuts, seeds, healthy oils (such as olive oil, avocado oil), whole grains (make sure for bread/pasta/crackers/etc., that the first ingredient on label contains  the word whole), legumes. -can eat small amounts of dairy and lean meat (no larger than the palm of your hand), but avoid processed meats such as ham, bacon, lunch meat, etc. -drink water -try to avoid fast food and pre-packaged foods, processed meat, ultra processed foods/beverages (donuts, candy, etc.) -most experts advise limiting sodium to < 2300mg  per day, should limit further is any chronic conditions such as high blood pressure, heart disease, diabetes, etc. The American Heart Association advised that < 1500mg  is is ideal -try to avoid foods/beverages that contain any ingredients with names you do not recognize  -try to avoid foods/beverages  with added sugar or sweeteners/sweets  -try to avoid sweet drinks (including diet drinks): soda, juice, Gatorade, sweet tea, power drinks, diet drinks -try to avoid white rice, white bread, pasta (unless whole grain)  EXERCISE GUIDELINES FOR ADULTS: -if you wish to increase your physical activity, do so gradually and with the approval of your doctor -STOP and seek medical  care immediately if you have any chest pain, chest discomfort or trouble breathing when starting or increasing exercise  -move and stretch your body, legs, feet and arms when sitting for long periods -Physical activity guidelines for optimal health in adults: -get at least 150 minutes per week of moderate exercise (can talk, but not sing); this is about 20-30 minutes of sustained activity 5-7 days per week or two 10-15 minute episodes of sustained activity 5-7 days per week -do some muscle building/resistance training/strength training at least 2 days per week  -balance exercises 3+ days per week:   Stand somewhere where you have something sturdy to hold onto if you lose balance    1) lift up on toes, then back down, start with 5x per day and work up to 20x   2) stand and lift one leg straight out to the side so that foot is a few inches of the floor, start with 5x each side and  work up to 20x each side   3) stand on one foot, start with 5 seconds each side and work up to 20 seconds on each side  If you need ideas or help with getting more active:  -Silver sneakers https://tools.silversneakers.com  -Walk with a Doc: http://www.duncan-williams.com/  -try to include resistance (weight lifting/strength building) and balance exercises twice per week: or the following link for ideas: http://castillo-powell.com/  BuyDucts.dk  STRESS MANAGEMENT: -can try meditating, or just sitting quietly with deep breathing while intentionally relaxing all parts of your body for 5 minutes daily -if you need further help with stress, anxiety or depression please follow up with your primary doctor or contact the wonderful folks at [facility]: [phone]  SOCIAL CONNECTIONS: -options in [place] if you wish to engage in more social and exercise related activities:  -Silver sneakers https://tools.silversneakers.com  -Walk with a Doc: http://www.duncan-williams.com/  -Check out the [facility] Active Adults 50+ section on the [place] [employer] (hiking clubs, book clubs, cards and games, chess, exercise classes, aquatic classes and much more) - see the website for details: https://www.[place].gov/departments/parks-recreation/active-adults50  -YouTube has lots of exercise videos for different ages and abilities as well  -[facility] (a variety of indoor and outdoor inperson activities for adults). [phone]. [address].  -Virtual Online Classes (a variety of topics): see seniorplanet.org or call [phone]  -consider volunteering at a school, hospice center, church, senior center or elsewhere            Chiquita JONELLE Cramp, DO

## 2024-04-29 ENCOUNTER — Ambulatory Visit

## 2024-04-29 ENCOUNTER — Encounter

## 2024-04-29 ENCOUNTER — Other Ambulatory Visit

## 2024-04-29 DIAGNOSIS — E1129 Type 2 diabetes mellitus with other diabetic kidney complication: Secondary | ICD-10-CM

## 2024-04-29 NOTE — Progress Notes (Signed)
 Patient presents for a nurses visit to remove and replace the Dexcom G7 sensor.

## 2024-05-03 ENCOUNTER — Inpatient Hospital Stay

## 2024-05-03 ENCOUNTER — Encounter: Payer: Self-pay | Admitting: Oncology

## 2024-05-03 ENCOUNTER — Inpatient Hospital Stay (HOSPITAL_BASED_OUTPATIENT_CLINIC_OR_DEPARTMENT_OTHER): Admitting: Oncology

## 2024-05-03 ENCOUNTER — Telehealth: Payer: Self-pay

## 2024-05-03 ENCOUNTER — Other Ambulatory Visit (HOSPITAL_BASED_OUTPATIENT_CLINIC_OR_DEPARTMENT_OTHER): Payer: Self-pay

## 2024-05-03 VITALS — BP 142/54 | HR 78 | Temp 97.6°F | Resp 15 | Ht 62.0 in | Wt 141.9 lb

## 2024-05-03 DIAGNOSIS — D500 Iron deficiency anemia secondary to blood loss (chronic): Secondary | ICD-10-CM

## 2024-05-03 DIAGNOSIS — K7460 Unspecified cirrhosis of liver: Secondary | ICD-10-CM | POA: Diagnosis not present

## 2024-05-03 DIAGNOSIS — K5521 Angiodysplasia of colon with hemorrhage: Secondary | ICD-10-CM | POA: Diagnosis not present

## 2024-05-03 DIAGNOSIS — D696 Thrombocytopenia, unspecified: Secondary | ICD-10-CM | POA: Diagnosis not present

## 2024-05-03 DIAGNOSIS — R053 Chronic cough: Secondary | ICD-10-CM | POA: Diagnosis not present

## 2024-05-03 LAB — MULTIPLE MYELOMA PANEL, SERUM
Albumin SerPl Elph-Mcnc: 3.3 g/dL (ref 2.9–4.4)
Albumin/Glob SerPl: 1.4 (ref 0.7–1.7)
Alpha 1: 0.2 g/dL (ref 0.0–0.4)
Alpha2 Glob SerPl Elph-Mcnc: 0.7 g/dL (ref 0.4–1.0)
B-Globulin SerPl Elph-Mcnc: 0.9 g/dL (ref 0.7–1.3)
Gamma Glob SerPl Elph-Mcnc: 0.7 g/dL (ref 0.4–1.8)
Globulin, Total: 2.5 g/dL (ref 2.2–3.9)
IgA: 265 mg/dL (ref 64–422)
IgG (Immunoglobin G), Serum: 806 mg/dL (ref 586–1602)
IgM (Immunoglobulin M), Srm: 64 mg/dL (ref 26–217)
Total Protein ELP: 5.8 g/dL — ABNORMAL LOW (ref 6.0–8.5)

## 2024-05-03 LAB — CBC WITH DIFFERENTIAL (CANCER CENTER ONLY)
Abs Immature Granulocytes: 0.03 K/uL (ref 0.00–0.07)
Basophils Absolute: 0 K/uL (ref 0.0–0.1)
Basophils Relative: 1 %
Eosinophils Absolute: 0.8 K/uL — ABNORMAL HIGH (ref 0.0–0.5)
Eosinophils Relative: 15 %
HCT: 27.1 % — ABNORMAL LOW (ref 36.0–46.0)
Hemoglobin: 8.2 g/dL — ABNORMAL LOW (ref 12.0–15.0)
Immature Granulocytes: 1 %
Lymphocytes Relative: 11 %
Lymphs Abs: 0.6 K/uL — ABNORMAL LOW (ref 0.7–4.0)
MCH: 29.3 pg (ref 26.0–34.0)
MCHC: 30.3 g/dL (ref 30.0–36.0)
MCV: 96.8 fL (ref 80.0–100.0)
Monocytes Absolute: 0.6 K/uL (ref 0.1–1.0)
Monocytes Relative: 10 %
Neutro Abs: 3.6 K/uL (ref 1.7–7.7)
Neutrophils Relative %: 62 %
Platelet Count: 119 K/uL — ABNORMAL LOW (ref 150–400)
RBC: 2.8 MIL/uL — ABNORMAL LOW (ref 3.87–5.11)
RDW: 15.4 % (ref 11.5–15.5)
WBC Count: 5.6 K/uL (ref 4.0–10.5)
nRBC: 0 % (ref 0.0–0.2)

## 2024-05-03 MED ORDER — AZITHROMYCIN 250 MG PO TABS
ORAL_TABLET | ORAL | 0 refills | Status: AC
  Filled 2024-05-03: qty 6, 5d supply, fill #0

## 2024-05-03 NOTE — Progress Notes (Signed)
 [facility]  HEMATOLOGY CLINIC PROGRESS NOTE  PATIENT NAME: Anita Mccormick   MR#: [id number] DOB: [birth year]  Patient Care Team: Mercer Clotilda SAUNDERS, MD as PCP - General (Family Medicine) [place], Peter M, MD as PCP - Cardiology (Cardiology) Octavia Bruckner, MD as Consulting Physician (Ophthalmology) Lionell Jon DEL, [facility] (Pharmacist)  Date of visit: 05/03/2024   ASSESSMENT & PLAN:   Anita Mccormick is a 78 y.o. lady with a past medical history of acute on chronic anemia due to gastrointestinal bleeding, history of gastric and small bowel AVMs/telangiectasias, liver cirrhosis, CAD s/p PEA arrest and NSTEMI during hospitalization in 09/2023, congestive heart failure, history of CVA, diabetes mellitus, hypertension, Hx of left breast cancer in early 2000s, s/p mastectomy and axillary LN dissection, did not receive adjuvant chemo, was referred to our service in June 2025 for evaluation of iron  deficiency anemia.     Anemia due to chronic blood loss Chronic anemia with a history of low hemoglobin levels, previously as low as 5.4 in May 2025, improved to 8.1 after transfusions.  Likely due to gastrointestinal bleeding causing slow blood loss.   Currently on oral iron  supplements (Fergon) every other day, tolerated well without constipation.   Occasional black stools and minor blood traces when wiping reported, possibly due to hemorrhoids, though she denies a history of hemorrhoids.  On her consultation with us  on 01/25/2024, labs revealed persistent anemia with hemoglobin of 7.7, MCV 97.  White count and platelet count were normal. Iron  studies continued to show evidence of iron  deficiency with iron  saturation of 5%, iron  decreased to 23.  B12, folate were within normal limits.  LDH borderline at 217.   Given persistent iron  deficiency anemia despite oral iron  supplementation, we proceeded with IV iron  using Venofer  x 5 doses, last dose on 03/01/2024.  Iron  deficiency anemia  persists despite recent blood transfusion. Hemoglobin improved to 8.2 from 6.9 post-transfusion. Iron  levels are maintained with current supplementation. No evidence of active bleeding or low iron  levels. Shortness of breath and fatigue persist, not improved by transfusion. Heart function was normal in March. - Continue iron  supplementation every other day. - Arrange for lab check in two weeks to monitor hemoglobin and iron  levels. - Schedule follow-up appointment in four weeks.  On 04/26/2024, workup was negative for monoclonal gammopathy including SPEP, serum free light chains.  It is likely that ongoing anemia is from occult GI blood loss from AVMs.  - Continue oral iron  supplements (Fergon) every other day.  Thrombocytopenia Platelet count is low at 119, fluctuating over time. Possible relation to liver issues. White blood cell count is normal.  Chronic cough Cough has persisted for about two weeks, with thick mucus production. No fever reported. Over-the-counter medications have not resolved symptoms. Lungs are clear on examination. - Prescribe Z-Pak (azithromycin ) with instructions to take two tablets on the first day and one tablet daily for the next four days.  Chronic indwelling peripherally inserted central catheter (PICC) management PICC line in place for IV iron  therapy. She expresses desire to remove PICC line due to inconvenience and difficulty with bathing. Discussed alternative of a port-a-cath, which would allow for easier management and less frequent maintenance. Port-a-cath involves a procedure with potential soreness for a day or two post-placement but allows for showering and no need for dressing changes. She is willing to proceed with Port-A-Cath placement.  This is currently scheduled on 05/10/2024.  Arteriovenous malformations of gastrointestinal tract Capsule endoscopy previously identified small AVMs in  the gastrointestinal tract, which may intermittently bleed and  contribute to anemia. She is unaware of any overt bleeding, but chronic blood loss from these AVMs is a concern.  Cirrhosis of liver Recent ultrasound indicates cirrhosis of the liver, previously diagnosed as fatty liver. Denies alcohol use, suggesting non-alcoholic causes such as fatty liver disease or other etiologies. Hepatitis testing was last performed in 2014, and it is unclear if recent testing has been done by the GI team.  I spent a total of 30 minutes during this encounter with the patient including review of chart and various tests results, discussions about plan of care and coordination of care plan.  I reviewed lab results and outside records for this visit and discussed relevant results with the patient. Diagnosis, plan of care and treatment options were also discussed in detail with the patient. Opportunity provided to ask questions and answers provided to her apparent satisfaction. Provided instructions to call our clinic with any problems, questions or concerns prior to return visit. I recommended to continue follow-up with PCP and sub-specialists. She verbalized understanding and agreed with the plan. No barriers to learning was detected.  Chinita Patten, MD  05/03/2024 5:56 PM  [facility] - A DEPT OF [name] [id number] [place] [address] Dept: [phone] Dept Fax: [phone]   CHIEF COMPLAINT/ REASON FOR VISIT:  Follow-up for chronic anemia, likely from occult GI blood loss.  Treated with IV iron   INTERVAL HISTORY:  Discussed the use of AI scribe software for clinical note transcription with the patient, who gave verbal consent to proceed.  History of Present Illness Anita Mccormick is a 78 year old female with anemia who presents with worsening shortness of breath and fatigue.   She experiences worsening shortness of breath and fatigue, becoming breathless with minimal exertion, such  as walking. Her hemoglobin level has decreased from 9.2 a month ago to 6.9 today. She has a history of anemia and has been taking iron  tablets every other day. She received an iron  infusion on August 5th, approximately two months ago.  She reports black stools, which she attributes to iron  supplementation. She previously underwent a capsule endoscopy, during which small arteriovenous malformations (AVMs) in the gut were identified. These AVMs were not actively bleeding at the time of the endoscopy, but there is a possibility of intermittent bleeding contributing to her anemia.  She has a history of liver cirrhosis, identified on a recent ultrasound. She was previously informed of having a fatty liver but was not aware of the cirrhosis diagnosis until now. She denies alcohol consumption and has had hepatitis testing in the past, with the last test conducted in 2014.  She currently has a PICC line on her right side, which is used for blood draws and requires regular cleaning. She is concerned about the risk of infection and the inconvenience of the PICC line, as it requires protection during showers and regular maintenance. She prefers to have it cleaned when she is already at the clinic to avoid additional trips.   Anita Mccormick is a 78 year old female with anemia who presents with persistent fatigue and shortness of breath.  She experiences persistent fatigue and shortness of breath despite a blood transfusion last week. No improvement in symptoms post-transfusion.  She has been experiencing breathlessness and a persistent cough for the past two weeks, which produces thick mucus. Over-the-counter medications like Vicks, DayQuil, and NyQuil provide some  relief but have not resolved the cough. No recent fever.  No signs of bleeding, such as blood in stools or black stools, although stools are dark but not black tarry.  She takes an iron  supplement every other day and reports no issues with  constipation when using a stool softener. Her iron  levels are currently maintained.  She experiences numbness and coldness, often eating ice, and reports toe pain following a podiatrist visit where a corn was removed. She is unable to wear new shoes due to soreness.  She recalls a past episode where she used nitroglycerin  due to breathlessness, not pain, and mentions her heart function was normal in March.  She plans to use a cycling device to improve her energy levels but is hindered by toe pain.   SUMMARY OF HEMATOLOGIC HISTORY:  She was hospitalized 5/28-12/27/23 for acute on chronic anemia due to GIB.  Patient given 2 units PRBCs.  Hemoglobin improved to 9.0 at time of discharge.  Patient with history of AVM/angioectasis noted on EGD 4/15.  GI consulted.  Capsule endoscopy 12/25/2023.  No active bleeding appreciated but relatively fast transit time could not limit small findings such as AVMs.  Endoscopic intervention was not amenable.  She was advised to continue iron  supplementation and referral was sent to us  for further evaluation and management of iron  deficiency anemia.   She has undergone endoscopic evaluations and iron  infusions, though she experiences difficulty with venous access during these procedures, requiring multiple attempts to establish an IV line.   Her hemoglobin level improved to 8.1 g/dL in June 2025 from a previous low of 5.4 g/dL following transfusions. She experiences dizziness, shortness of breath, and coughing. No current chest pain, but ongoing shortness of breath is noted.   She takes Fergon, an iron  supplement, every other day as prescribed since her last hospital visit, and tolerates it well without constipation. She notes black stools and occasional traces of blood when wiping, but denies any history of hemorrhoids, epistaxis, or gum bleeding.   Her past medical history includes breast cancer treated with surgery approximately 20 years ago, without chemotherapy or  radiation. She had implants placed and later removed due to rupture. She does not recall taking hormone-blocking medications post-surgery.   In March 2025, she experienced a rib fracture and lung puncture during CPR, which required a chest tube. She expresses gratitude towards the medical staff involved in her care during that incident.  Chronic anemia with a history of low hemoglobin levels, previously as low as 5.4 in May 2025, improved to 8.1 after transfusions.   Likely due to gastrointestinal bleeding causing slow blood loss.    Currently on oral iron  supplements (Fergon) every other day, tolerating well without constipation.    Occasional black stools and minor blood traces when wiping reported, possibly due to hemorrhoids, though she denies a history of hemorrhoids.   On her consultation with us  on 01/25/2024, labs revealed persistent anemia with hemoglobin of 7.7, MCV 97.  White count and platelet count were normal. Iron  studies continued to show evidence of iron  deficiency with iron  saturation of 5%, iron  decreased to 23.  B12, folate were within normal limits.  LDH borderline at 217.   Given persistent iron  deficiency anemia despite oral iron  supplementation, we proceeded with IV iron  using Venofer  x 5 doses.  On 04/26/2024, workup was negative for monoclonal gammopathy including SPEP, serum free light chains    I have reviewed the past medical history, past surgical history, social history and  family history with the patient and they are unchanged from previous note.  ALLERGIES: She is allergic to bee venom, penicillins, ace inhibitors, chlorthalidone, lactose intolerance (gi), metformin , and sulfamethoxazole.  MEDICATIONS:  Current Outpatient Medications  Medication Sig Dispense Refill   ACCU-CHEK GUIDE TEST test strip 1 each by Other route 3 (three) times daily.     acetaminophen  (TYLENOL ) 325 MG tablet Take 2 tablets (650 mg total) by mouth every 6 (six) hours as needed for mild  pain (pain score 1-3) (or Fever >/= 101).     albuterol  (VENTOLIN  HFA) 108 (90 Base) MCG/ACT inhaler Inhale 2 puffs into the lungs every 6 (six) hours as needed for wheezing or shortness of breath. 6.7 g 0   aspirin  EC 81 MG tablet Take 81 mg by mouth daily. Swallow whole.     atorvastatin  (LIPITOR ) 80 MG tablet Take 1 tablet (80 mg total) by mouth daily. 90 tablet 3   azithromycin  (ZITHROMAX ) 250 MG tablet Take 2 tablets by mouth on day 1, then one tablet daily until complete. 6 each 0   Blood Glucose Monitoring Suppl (ACCU-CHEK GUIDE ME) w/Device KIT Inject 1 Units into the skin 3 (three) times daily.     Budeson-Glycopyrrol-Formoterol  (BREZTRI AEROSPHERE IN) Inhale 2 puffs into the lungs 2 (two) times daily.     clopidogrel  (PLAVIX ) 75 MG tablet Take 1 tablet (75 mg total) by mouth daily. 90 tablet 2   Continuous Glucose Sensor (DEXCOM G7 SENSOR) MISC APPLY ONE SENSOR EVERY 21 DAYS 2 each 11   Dulaglutide  (TRULICITY ) 1.5 MG/0.5ML SOAJ Inject 1.5 mg into the skin once a week.     ferrous gluconate  (FERGON) 324 MG tablet Take 1 tablet (324 mg total) by mouth every other day.     glipiZIDE  (GLUCOTROL  XL) 10 MG 24 hr tablet TAKE 1 TABLET TWICE DAILY 180 tablet 3   glucose 4 GM chewable tablet Chew 1 tablet by mouth as needed for low blood sugar.     metoprolol  succinate (TOPROL -XL) 25 MG 24 hr tablet Take 1 tablet (25 mg total) by mouth daily. 90 tablet 1   Multiple Vitamin (MULTIVITAMIN) tablet Take 1 tablet by mouth daily.     nitroGLYCERIN  (NITROSTAT ) 0.4 MG SL tablet Place 1 tablet (0.4 mg total) under the tongue every 5 (five) minutes as needed for chest pain. 10 tablet 5   pantoprazole  (PROTONIX ) 40 MG tablet Take 1 tablet (40 mg total) by mouth daily. 90 tablet 3   No current facility-administered medications for this visit.     REVIEW OF SYSTEMS:    Review of Systems - Oncology  All other pertinent systems were reviewed with the patient and are negative.  PHYSICAL EXAMINATION:     Onc Performance Status - 05/03/24 1328       ECOG Perf Status   ECOG Perf Status Ambulatory and capable of all selfcare but unable to carry out any work activities.  Up and about more than 50% of waking hours      KPS SCALE   KPS % SCORE Cares for self, unable to carry on normal activity or to do active work          Vitals:   05/03/24 1321  BP: (!) 142/54  Pulse: 78  Resp: 15  Temp: 97.6 F (36.4 C)  SpO2: 100%    Filed Weights   05/03/24 1321  Weight: 141 lb 14.4 oz (64.4 kg)    Physical Exam Constitutional:      General: She  is not in acute distress.    Appearance: Normal appearance.  HENT:     Head: Normocephalic and atraumatic.  Cardiovascular:     Rate and Rhythm: Normal rate.  Pulmonary:     Effort: Pulmonary effort is normal. No respiratory distress.  Abdominal:     General: There is no distension.  Musculoskeletal:     Comments: Right arm PICC line in place  Neurological:     General: No focal deficit present.     Mental Status: She is alert and oriented to person, place, and time.  Psychiatric:        Mood and Affect: Mood normal.        Behavior: Behavior normal.     LABORATORY DATA:   I have reviewed the data as listed.  Results for orders placed or performed in visit on 05/03/24  CBC with Differential (Cancer Center Only)  Result Value Ref Range   WBC Count 5.6 4.0 - 10.5 K/uL   RBC 2.80 (L) 3.87 - 5.11 MIL/uL   Hemoglobin 8.2 (L) 12.0 - 15.0 g/dL   HCT 72.8 (L) 63.9 - 53.9 %   MCV 96.8 80.0 - 100.0 fL   MCH 29.3 26.0 - 34.0 pg   MCHC 30.3 30.0 - 36.0 g/dL   RDW 84.5 88.4 - 84.4 %   Platelet Count 119 (L) 150 - 400 K/uL   nRBC 0.0 0.0 - 0.2 %   Neutrophils Relative % 62 %   Neutro Abs 3.6 1.7 - 7.7 K/uL   Lymphocytes Relative 11 %   Lymphs Abs 0.6 (L) 0.7 - 4.0 K/uL   Monocytes Relative 10 %   Monocytes Absolute 0.6 0.1 - 1.0 K/uL   Eosinophils Relative 15 %   Eosinophils Absolute 0.8 (H) 0.0 - 0.5 K/uL   Basophils Relative 1 %    Basophils Absolute 0.0 0.0 - 0.1 K/uL   Immature Granulocytes 1 %   Abs Immature Granulocytes 0.03 0.00 - 0.07 K/uL      RADIOGRAPHIC STUDIES:  US  Abdomen Limited RUQ (LIVER/GB) CLINICAL DATA:  Cirrhosis.  HCC screening.  EXAM: ULTRASOUND ABDOMEN LIMITED RIGHT UPPER QUADRANT  COMPARISON:  12/23/2023  FINDINGS: Gallbladder:  Gallstones: None  Sludge: None  Gallbladder Wall: Within normal limits  Pericholecystic fluid: None  Sonographic Murphy's Sign: Negative per technologist  Common bile duct:  Diameter: 4 mm  Liver:  Parenchymal echogenicity: Diffusely coarsened  Contours: Nodular  Lesions: None  Portal vein: Patent.  Hepatopetal flow  Other: None.  IMPRESSION: Cirrhotic liver morphology without focal hepatic lesion.  Electronically Signed   By: Aliene Lloyd M.D.   On: 04/21/2024 14:14    Orders Placed This Encounter  Procedures   CBC with Differential (Cancer Center Only)    Standing Status:   Standing    Number of Occurrences:   6    Expiration Date:   05/03/2025   Ferritin    Standing Status:   Future    Expected Date:   06/03/2024    Expiration Date:   05/03/2025   Iron  and TIBC    Standing Status:   Future    Expected Date:   06/03/2024    Expiration Date:   05/03/2025     Future Appointments  Date Time Provider Department Center  05/06/2024 12:30 PM [facility] INFUSION [facility] None  05/10/2024 11:00 AM [facility] ROOM [facility] None  05/10/2024  1:30 PM [facility] 1 [facility] [place]  05/17/2024 12:00 PM [facility] PHLEBOTOMIST [facility] None  05/17/2024 12:30  PM [facility] INFUSION [facility] None  05/18/2024 10:30 AM Mercer Clotilda SAUNDERS, MD [facility] [address]  05/31/2024  2:00 PM [facility] PHLEBOTOMIST [facility] None  05/31/2024  2:15 PM [facility] INFUSION [facility] None  05/31/2024  2:30 PM Ahnyla Mendel, Ch[place], MD [facility] None  07/13/2024  9:45 AM Gaynel Delon CROME, DPM [facility]  03/22/2025 12:20 PM Alvia Norleen BIRCH, MD [facility] None     This document was completed utilizing speech recognition software. Grammatical errors, random word insertions, pronoun errors, and incomplete sentences are an occasional consequence of this system due to software limitations, ambient noise, and hardware issues. Any formal questions or concerns about the content, text or information contained within the body of this dictation should be directly addressed to the provider for clarification.

## 2024-05-03 NOTE — Progress Notes (Signed)
   05/03/2024  Patient ID: Anita Mccormick, female   DOB: Mar 29, 1946, 78 y.o.   MRN: [id number]  Received fax notification from [employer] that patient has been conditionally approved to receive Breztri for 2026. In order to get full approval for 2026 and continue to receive product, patient needs to provide consent that they acknowledge the terms and conditions of the program.   This can be down in the following ways: 1) Patient can go to [email or web address] to provide consent via digital assistant 2) Call [phone] to provide verbal consent 3) Complete the paper application   [employer] Patient ID: [id number]   Forwarding to CPhT to assist with enrollment process for patient.   Jon VEAR Lindau, PharmD Clinical Pharmacist [phone]

## 2024-05-03 NOTE — Assessment & Plan Note (Addendum)
 Chronic anemia with a history of low hemoglobin levels, previously as low as 5.4 in May 2025, improved to 8.1 after transfusions.  Likely due to gastrointestinal bleeding causing slow blood loss.   Currently on oral iron  supplements (Fergon) every other day, tolerated well without constipation.   Occasional black stools and minor blood traces when wiping reported, possibly due to hemorrhoids, though she denies a history of hemorrhoids.  On her consultation with us  on 01/25/2024, labs revealed persistent anemia with hemoglobin of 7.7, MCV 97.  White count and platelet count were normal. Iron  studies continued to show evidence of iron  deficiency with iron  saturation of 5%, iron  decreased to 23.  B12, folate were within normal limits.  LDH borderline at 217.   Given persistent iron  deficiency anemia despite oral iron  supplementation, we proceeded with IV iron  using Venofer  x 5 doses, last dose on 03/01/2024.  Iron  deficiency anemia persists despite recent blood transfusion. Hemoglobin improved to 8.2 from 6.9 post-transfusion. Iron  levels are maintained with current supplementation. No evidence of active bleeding or low iron  levels. Shortness of breath and fatigue persist, not improved by transfusion. Heart function was normal in March. - Continue iron  supplementation every other day. - Arrange for lab check in two weeks to monitor hemoglobin and iron  levels. - Schedule follow-up appointment in four weeks.  On 04/26/2024, workup was negative for monoclonal gammopathy including SPEP, serum free light chains.  It is likely that ongoing anemia is from occult GI blood loss from AVMs.  - Continue oral iron  supplements (Fergon) every other day.

## 2024-05-05 ENCOUNTER — Telehealth: Payer: Self-pay | Admitting: *Deleted

## 2024-05-05 NOTE — Telephone Encounter (Signed)
 Copied from CRM [id number]. Topic: Clinical - Medical Advice >> May 05, 2024 12:03 PM Anita Mccormick wrote: Reason for CRM: Pt would like Leah to call her back. Pt would not advise what she needs to be called for. Pt stated Anita will know.

## 2024-05-06 ENCOUNTER — Other Ambulatory Visit: Payer: Self-pay

## 2024-05-06 ENCOUNTER — Inpatient Hospital Stay (HOSPITAL_BASED_OUTPATIENT_CLINIC_OR_DEPARTMENT_OTHER)

## 2024-05-06 ENCOUNTER — Ambulatory Visit

## 2024-05-06 DIAGNOSIS — E1129 Type 2 diabetes mellitus with other diabetic kidney complication: Secondary | ICD-10-CM

## 2024-05-06 NOTE — Patient Instructions (Signed)
 PICC Removal, Adult, Care After The following information offers guidance on how to care for yourself after your procedure. Your health care provider may also give you more specific instructions. If you have problems or questions, contact your health care provider. What can I expect after the procedure? After the procedure, it is common to have: Tenderness or soreness. Redness, swelling, or a scab at the place where your PICC was removed (exit site). Follow these instructions at home: For the first 24 hours after the procedure: Keep the bandage (dressing) on your exit site clean and dry. Do not remove your dressing until your health care provider tells you to do so. Do not lift anything heavy or do activities that require great effort until your health care provider says it is okay. You should avoid: Lifting weights. Doing yard work. Doing any physical activity with repetitive arm movement. Watch closely for any signs of an air bubble in the vein (air embolism). This is a rare but serious complication. Signs of an air embolism include trouble breathing, wheezing, chest pain, or a fast pulse. If you have signs of an air embolism, call 911 right away and lie down on your left side to keep the air from moving into your lungs. After 24 hours have passed:  Remove your dressing as told by your health care provider. Wash your hands with soap and water for at least 20 seconds before and after you change your dressing. If soap and water are not available, use hand sanitizer. Return to your normal activities as told by your health care provider. A small scab may develop over the exit site. Do not pick at the scab. When bathing or showering, gently wash the exit site with soap and water. Pat it dry. Watch for signs of infection, such as: A fever or chills. Swollen glands under your arm. More redness, swelling, or soreness around your arm. Blood, fluid, or pus coming from your exit site. Warmth or a  bad smell coming from your exit site. A red streak spreading away from your exit site. General instructions Take over-the-counter and prescription medicines only as told by your health care provider. Do not take any new medicines without checking with your health care provider first. If you were given an antibiotic ointment, apply it as told by your health care provider. Keep all follow-up visits. This is important. Contact a health care provider if: You have a fever or chills. You have swelling at your exit site or swollen glands under your arm. You have signs of infection at your exit site. You have soreness, redness, or swelling in your arm that gets worse. Get help right away if: You have numbness or tingling in your fingers, hand, or arm. Your arm looks blue and feels cold. You have signs of an air embolism, such as trouble breathing, wheezing, chest pain, or a fast pulse. These symptoms may be an emergency. Get medical help right away. Call 911. Do not wait to see if the symptoms will go away. Do not drive yourself to the hospital. Summary After a PICC is removed, it is common to have tenderness or soreness, redness, swelling, or a scab at the exit site. Keep the bandage (dressing) over the exit site clean and dry. Do not remove the dressing until your health care provider tells you to do so. Do not lift anything heavy or do activities that require great effort until your health care provider says it is okay. Watch closely for any signs  of an air bubble (air embolism). If you have signs of an air embolism, call 911 right away and lie down on your left side. This information is not intended to replace advice given to you by your health care provider. Make sure you discuss any questions you have with your health care provider. Document Revised: 01/30/2021 Document Reviewed: 01/30/2021 Elsevier Patient Education  2024 [employer].

## 2024-05-06 NOTE — Progress Notes (Signed)
 Patient presented today to have PICC line discontinued. PICC was removed with all 36cm intact. Pressure was applied for 5 minutes and pressure dressing applied. Patient remained in supine position for 30 minutes post PICC removal. Patient 's VSS and education provided on post PICC removal care.

## 2024-05-06 NOTE — Progress Notes (Signed)
 Patient in for placement of Dexcom, see note for [facility] 01/06/2024. Dexcom removed from patient's upper right arm without complication. Skin cleansed with antiseptic wipes. New Dexcom placed above previous location per patient request. Dexcom linked to receiver. Education given to patient regarding Dexcom placement and the need to alter sites. Patient declines Dexcom placement in abdomen or leg. Patient states she is unable to place new Dexcom by herself and her husband is unable to as well. Educated patient on dietary habits and encouraged monitoring of glucose levels. Patient voiced understanding.

## 2024-05-10 ENCOUNTER — Ambulatory Visit (HOSPITAL_COMMUNITY)
Admission: RE | Admit: 2024-05-10 | Discharge: 2024-05-10 | Disposition: A | Discharge status: Home / Self Care | Source: Ambulatory Visit | Attending: Oncology | Admitting: Oncology

## 2024-05-10 ENCOUNTER — Ambulatory Visit (HOSPITAL_COMMUNITY)
Admission: RE | Admit: 2024-05-10 | Discharge: 2024-05-10 | Disposition: A | Source: Ambulatory Visit | Attending: Oncology

## 2024-05-10 ENCOUNTER — Encounter (HOSPITAL_COMMUNITY): Payer: Self-pay

## 2024-05-10 ENCOUNTER — Other Ambulatory Visit: Payer: Self-pay

## 2024-05-10 ENCOUNTER — Encounter

## 2024-05-10 DIAGNOSIS — Z853 Personal history of malignant neoplasm of breast: Secondary | ICD-10-CM | POA: Insufficient documentation

## 2024-05-10 DIAGNOSIS — Z8673 Personal history of transient ischemic attack (TIA), and cerebral infarction without residual deficits: Secondary | ICD-10-CM | POA: Diagnosis not present

## 2024-05-10 DIAGNOSIS — I252 Old myocardial infarction: Secondary | ICD-10-CM | POA: Insufficient documentation

## 2024-05-10 DIAGNOSIS — K7460 Unspecified cirrhosis of liver: Secondary | ICD-10-CM | POA: Insufficient documentation

## 2024-05-10 DIAGNOSIS — Z79899 Other long term (current) drug therapy: Secondary | ICD-10-CM | POA: Diagnosis not present

## 2024-05-10 DIAGNOSIS — I509 Heart failure, unspecified: Secondary | ICD-10-CM | POA: Diagnosis not present

## 2024-05-10 DIAGNOSIS — E119 Type 2 diabetes mellitus without complications: Secondary | ICD-10-CM | POA: Diagnosis not present

## 2024-05-10 DIAGNOSIS — Z87891 Personal history of nicotine dependence: Secondary | ICD-10-CM | POA: Insufficient documentation

## 2024-05-10 DIAGNOSIS — D649 Anemia, unspecified: Secondary | ICD-10-CM | POA: Diagnosis not present

## 2024-05-10 DIAGNOSIS — Z7985 Long-term (current) use of injectable non-insulin antidiabetic drugs: Secondary | ICD-10-CM | POA: Diagnosis not present

## 2024-05-10 DIAGNOSIS — I2510 Atherosclerotic heart disease of native coronary artery without angina pectoris: Secondary | ICD-10-CM | POA: Insufficient documentation

## 2024-05-10 DIAGNOSIS — D696 Thrombocytopenia, unspecified: Secondary | ICD-10-CM | POA: Diagnosis not present

## 2024-05-10 DIAGNOSIS — Z8674 Personal history of sudden cardiac arrest: Secondary | ICD-10-CM | POA: Insufficient documentation

## 2024-05-10 DIAGNOSIS — Z9012 Acquired absence of left breast and nipple: Secondary | ICD-10-CM | POA: Diagnosis not present

## 2024-05-10 DIAGNOSIS — Z7984 Long term (current) use of oral hypoglycemic drugs: Secondary | ICD-10-CM | POA: Insufficient documentation

## 2024-05-10 DIAGNOSIS — D500 Iron deficiency anemia secondary to blood loss (chronic): Secondary | ICD-10-CM | POA: Diagnosis not present

## 2024-05-10 DIAGNOSIS — I110 Hypertensive heart disease with heart failure: Secondary | ICD-10-CM | POA: Diagnosis not present

## 2024-05-10 HISTORY — PX: IR IMAGING GUIDED PORT INSERTION: IMG5740

## 2024-05-10 LAB — GLUCOSE, CAPILLARY
Glucose-Capillary: 49 mg/dL — ABNORMAL LOW (ref 70–99)
Glucose-Capillary: 112 mg/dL — ABNORMAL HIGH (ref 70–99)

## 2024-05-10 MED ORDER — DEXTROSE 50 % IV SOLN
INTRAVENOUS | Status: AC
Start: 2024-05-10 — End: 2024-05-10
  Administered 2024-05-10: 25 mL via INTRAVENOUS
  Filled 2024-05-10: qty 50

## 2024-05-10 MED ORDER — LIDOCAINE-EPINEPHRINE 1 %-1:100000 IJ SOLN
20.0000 mL | Freq: Once | INTRAMUSCULAR | Status: AC
  Administered 2024-05-10: 10 mL via INTRADERMAL

## 2024-05-10 MED ORDER — LIDOCAINE HCL 1 % IJ SOLN
20.0000 mL | Freq: Once | INTRAMUSCULAR | Status: AC
  Administered 2024-05-10: 10 mL via INTRADERMAL

## 2024-05-10 MED ORDER — MIDAZOLAM HCL 2 MG/2ML IJ SOLN
INTRAMUSCULAR | Status: AC | PRN
  Administered 2024-05-10: 1 mg via INTRAVENOUS

## 2024-05-10 MED ORDER — DEXTROSE 50 % IV SOLN: 25.0000 mL | Freq: Once | INTRAVENOUS | Status: AC

## 2024-05-10 MED ORDER — FENTANYL CITRATE (PF) 100 MCG/2ML IJ SOLN
INTRAMUSCULAR | Status: AC | PRN
  Administered 2024-05-10: 50 ug via INTRAVENOUS

## 2024-05-10 MED ORDER — FENTANYL CITRATE (PF) 100 MCG/2ML IJ SOLN
INTRAMUSCULAR | Status: AC
  Filled 2024-05-10: qty 2

## 2024-05-10 MED ORDER — HEPARIN SOD (PORK) LOCK FLUSH 100 UNIT/ML IV SOLN
INTRAVENOUS | Status: AC
  Filled 2024-05-10: qty 5

## 2024-05-10 MED ORDER — HEPARIN SOD (PORK) LOCK FLUSH 100 UNIT/ML IV SOLN
500.0000 [IU] | Freq: Once | INTRAVENOUS | Status: AC
  Administered 2024-05-10: 500 [IU] via INTRAVENOUS

## 2024-05-10 MED ORDER — LIDOCAINE-EPINEPHRINE 1 %-1:100000 IJ SOLN
INTRAMUSCULAR | Status: AC
  Filled 2024-05-10: qty 1

## 2024-05-10 MED ORDER — SODIUM CHLORIDE 0.9 % IV SOLN: INTRAVENOUS | Status: DC

## 2024-05-10 MED ORDER — MIDAZOLAM HCL 2 MG/2ML IJ SOLN
INTRAMUSCULAR | Status: AC
  Filled 2024-05-10: qty 2

## 2024-05-10 MED ORDER — LIDOCAINE HCL 1 % IJ SOLN
INTRAMUSCULAR | Status: AC
  Filled 2024-05-10: qty 20

## 2024-05-10 NOTE — Sedation Documentation (Signed)
 RN Adyan Palau pulled 2mg  Versed  and 100mcg Fentanyl  in IR room pysix.Pt. Received 2mg  Versed  and 100 mcg Fentanyl  throughout the procedure.

## 2024-05-10 NOTE — Procedures (Signed)
 Interventional Radiology Procedure:   Indications: Poor venous access with anemia.  Needs frequent infusions.  Procedure: Port placement  Findings: Right jugular port, tip at SVC/RA junction.    Complications: None     EBL: Minimal, less than 10 ml  Plan: Discharge in one hour.  Keep port site and incisions dry for at least 24 hours.     Decorey Wahlert R. Philip, MD  Pager: [phone]

## 2024-05-10 NOTE — Discharge Instructions (Signed)

## 2024-05-10 NOTE — Progress Notes (Signed)
 CBG = 49. Dr. Philip notified. New order received.

## 2024-05-10 NOTE — H&P (Signed)
 Chief Complaint: Anemia  Referring Provider(s): Pasam,Avinash   Supervising Physician: Philip Cornet  Patient Status: [facility] - Out-pt  History of Present Illness: Anita Mccormick is a 78 y.o. female with PMH significant for chronic anemia (GI bleeding), gastric and small bowel AVMs/telangiectasias, liver cirrhosis, CAD s/p PEA arrest and NSTEMI during hospitalization in 09/2023, congestive heart failure, history of CVA, diabetes mellitus, hypertension, Hx of left breast cancer in early 2000s, s/p mastectomy and axillary LN dissection.  She is followed by Dr. Autumn with oncology. Previously, a PICC has been used for frequent blood draws and infusions for persistent anemia r/t chronic blood loss. She expressed desire to remove PICC line due to inconvenience and difficulty with bathing. IR consulted for port.    Confirms NPO since MN  and ride/supervision available for 24 hours.   Denies fever, chills, SOB, CP, sore throat, N/V, abd pain, blood urine, abnormal bruising. She endorses dark stools which are not new and cough.  Allergies Reviewed:  Bee venom, Penicillins, Ace inhibitors, Chlorthalidone, Lactose intolerance (gi), Metformin , and Sulfamethoxazole    Patient is Full Code  Past Medical History:  Diagnosis Date   Arthritis    Cancer (HCC)    breast left   Cerebrovascular accident [facility])    October 29 2021- Left eye- blind   COLONIC POLYPS, HX OF 12/10/2006   Qualifier: Diagnosis of  By: Tammie MD, Bruce     Diabetes mellitus type II, controlled (HCC) 12/10/2006   Poor control on Januvia  100mg  and glipizide  10mg  XL Lab Results  Component Value Date   HGBA1C 8.3* 11/02/2014        Eczema    Fatty liver disease, nonalcoholic 07/14/2014   Per Dr. Tammie Lab Results  Component Value Date   ALT 31 11/02/2014   AST 57* 11/02/2014   ALKPHOS 104 11/02/2014   BILITOT 1.1 11/02/2014       GERD (gastroesophageal reflux disease)    History of blood transfusion    History of kidney stones  2023   11/28/21 small stone currently, taking flomax    Hypertension    Iron  deficiency anemia, unspecified 05/08/2023   Sepsis (HCC) 09/19/2017   Sepsis due to Escherichia coli (E. coli) (HCC)    Symptomatic anemia 06/06/2021   UTI (urinary tract infection) 09/19/2017   Vaginal discharge 04/01/2022    Past Surgical History:  Procedure Laterality Date   ARTERY BIOPSY Left 11/29/2021   Procedure: LEFT BIOPSY TEMPORAL ARTERY;  Surgeon: Eliza Lonni RAMAN, MD;  Location: [facility] OR;  Service: Vascular;  Laterality: Left;   BIOPSY OF SKIN SUBCUTANEOUS TISSUE AND/OR MUCOUS MEMBRANE  11/10/2023   Procedure: BIOPSY;  Surgeon: Federico Rosario BROCKS, MD;  Location: [facility] ENDOSCOPY;  Service: Gastroenterology;;   CATARACT EXTRACTION Bilateral    COLONOSCOPY N/A 11/10/2023   Procedure: COLONOSCOPY;  Surgeon: Federico Rosario BROCKS, MD;  Location: [facility] ENDOSCOPY;  Service: Gastroenterology;  Laterality: N/A;   ESOPHAGOGASTRODUODENOSCOPY N/A 11/10/2023   Procedure: EGD (ESOPHAGOGASTRODUODENOSCOPY);  Surgeon: Federico Rosario BROCKS, MD;  Location: [facility] ENDOSCOPY;  Service: Gastroenterology;  Laterality: N/A;   GIVENS CAPSULE STUDY N/A 12/25/2023   Procedure: IMAGING PROCEDURE, GI TRACT, INTRALUMINAL, VIA CAPSULE;  Surgeon: Legrand Victory LITTIE DOUGLAS, MD;  Location: [facility] ENDOSCOPY;  Service: Gastroenterology;  Laterality: N/A;   HOT HEMOSTASIS N/A 11/10/2023   Procedure: EGD, WITH ARGON PLASMA COAGULATION;  Surgeon: Federico Rosario BROCKS, MD;  Location: [facility] ENDOSCOPY;  Service: Gastroenterology;  Laterality: N/A;   LEFT HEART CATH AND CORONARY ANGIOGRAPHY N/A 10/20/2023  Procedure: LEFT HEART CATH AND CORONARY ANGIOGRAPHY;  Surgeon: [place]az[place], Peter M, MD;  Location: [facility] INVASIVE CV LAB;  Service: Cardiovascular;  Laterality: N/A;   MASTECTOMY Left    POLYPECTOMY  11/10/2023   Procedure: POLYPECTOMY, INTESTINE;  Surgeon: Federico Rosario BROCKS, MD;  Location: [facility] ENDOSCOPY;  Service: Gastroenterology;;   TUBAL LIGATION        Medications: Prior to Admission  medications   Medication Sig Start Date End Date Taking? Authorizing Provider  acetaminophen  (TYLENOL ) 325 MG tablet Take 2 tablets (650 mg total) by mouth every 6 (six) hours as needed for mild pain (pain score 1-3) (or Fever >/= 101). 10/27/23  Yes Krishnan, Gokul, MD  aspirin  EC 81 MG tablet Take 81 mg by mouth daily. Swallow whole.   Yes [provider]  atorvastatin  (LIPITOR ) 80 MG tablet Take 1 tablet (80 mg total) by mouth daily. 04/01/24  Yes Mercer Clotilda SAUNDERS, MD  azithromycin  (ZITHROMAX ) 250 MG tablet Take 2 tablets by mouth on day 1, then one tablet daily until complete. 05/03/24  Yes Pasam, Avinash, MD  Budeson-Glycopyrrol-Formoterol  (BREZTRI AEROSPHERE IN) Inhale 2 puffs into the lungs 2 (two) times daily.   Yes [provider]  clopidogrel  (PLAVIX ) 75 MG tablet Take 1 tablet (75 mg total) by mouth daily. 04/01/24  Yes Mercer Clotilda SAUNDERS, MD  ferrous gluconate  (FERGON) 324 MG tablet Take 1 tablet (324 mg total) by mouth every other day. 04/01/24  Yes Mercer Clotilda SAUNDERS, MD  glipiZIDE  (GLUCOTROL  XL) 10 MG 24 hr tablet TAKE 1 TABLET TWICE DAILY 12/09/23  Yes Mercer Clotilda SAUNDERS, MD  metoprolol  succinate (TOPROL -XL) 25 MG 24 hr tablet Take 1 tablet (25 mg total) by mouth daily. 04/01/24  Yes Mercer Clotilda SAUNDERS, MD  Multiple Vitamin (MULTIVITAMIN) tablet Take 1 tablet by mouth daily.   Yes [provider]  pantoprazole  (PROTONIX ) 40 MG tablet Take 1 tablet (40 mg total) by mouth daily. 12/14/23  Yes Mercer Clotilda SAUNDERS, MD  ACCU-CHEK GUIDE TEST test strip 1 each by Other route 3 (three) times daily. 01/12/24   [provider]  albuterol  (VENTOLIN  HFA) 108 (90 Base) MCG/ACT inhaler Inhale 2 puffs into the lungs every 6 (six) hours as needed for wheezing or shortness of breath. 11/11/23   Gonfa, Taye T, MD  Blood Glucose Monitoring Suppl (ACCU-CHEK GUIDE ME) w/Device KIT Inject 1 Units into the skin 3 (three) times daily. 11/19/23   [provider]  Continuous Glucose Sensor  (DEXCOM G7 SENSOR) MISC APPLY ONE SENSOR EVERY 21 DAYS 03/02/24   Mercer Clotilda SAUNDERS, MD  Dulaglutide  (TRULICITY ) 1.5 MG/0.5ML SOAJ Inject 1.5 mg into the skin once a week. 12/23/23   [provider]  glucose 4 GM chewable tablet Chew 1 tablet by mouth as needed for low blood sugar.    [provider]  nitroGLYCERIN  (NITROSTAT ) 0.4 MG SL tablet Place 1 tablet (0.4 mg total) under the tongue every 5 (five) minutes as needed for chest pain. 11/18/23 11/17/24  Mercer Clotilda SAUNDERS, MD     Family History  Problem Relation Age of Onset   Stroke Mother    Cancer Father        prostate   Diabetes Sister    Pancreatic cancer Sister     Social History   Socioeconomic History   Marital status: Married    Spouse name: Not on file   Number of children: 4   Years of education: Not on file   Highest education level: Not on  file  Occupational History   Not on file  Tobacco Use   Smoking status: Former    Current packs/day: 0.20    Average packs/day: 0.2 packs/day for 10.0 years (2.0 ttl pk-yrs)    Types: Cigarettes   Smokeless tobacco: Never  Vaping Use   Vaping status: Never Used  Substance and Sexual Activity   Alcohol use: Not Currently   Drug use: No   Sexual activity: Not Currently  Other Topics Concern   Not on file  Social History Narrative   Married (husband Miquel). 4 children. 11 grandkids. 1 greatgrandchild.       Still working as homemaker      Hobbies: playing cards, board games, cooking, Print production planner   Social Drivers of Corporate investment banker Strain: Low Risk  (04/28/2024)   Overall Financial Resource Strain (CARDIA)    Difficulty of Paying Living Expenses: Not hard at all  Food Insecurity: No Food Insecurity (04/28/2024)   Hunger Vital Sign    Worried About Running Out of Food in the Last Year: Never true    Ran Out of Food in the Last Year: Never true  Transportation Needs: No Transportation Needs (04/28/2024)   PRAPARE - Scientist, research (physical sciences) (Medical): No    Lack of Transportation (Non-Medical): No  Physical Activity: Inactive (04/28/2024)   Exercise Vital Sign    Days of Exercise per Week: 7 days    Minutes of Exercise per Session: 0 min  Stress: No Stress Concern Present (04/28/2024)   [employer] of Occupational Health - Occupational Stress Questionnaire    Feeling of Stress: Only a little  Social Connections: Moderately Integrated (04/28/2024)   Social Connection and Isolation Panel    Frequency of Communication with Friends and Family: More than three times a week    Frequency of Social Gatherings with Friends and Family: More than three times a week    Attends Religious Services: More than 4 times per year    Active Member of [employer] or Organizations: No    Attends Banker Meetings: Never    Marital Status: Married     Review of Systems: A 12 point ROS discussed and pertinent positives are indicated in the HPI above.  All other systems are negative.   Vital Signs: Ht 5' 2 (1.575 m)   Wt 141 lb (64 kg)   BMI 25.79 kg/m   Advance Care Plan: No documents on file    Physical Exam Constitutional:      Appearance: She is normal weight.  HENT:     Mouth/Throat:     Mouth: Mucous membranes are moist.     Pharynx: Oropharynx is clear.  Cardiovascular:     Rate and Rhythm: Normal rate.     Pulses: Normal pulses.  Pulmonary:     Effort: Pulmonary effort is normal.  Abdominal:     Palpations: Abdomen is soft.     Tenderness: There is no abdominal tenderness.  Skin:    General: Skin is warm and dry.     Comments: No rash or wounds R chest PICC not present  Neurological:     Mental Status: She is alert and oriented to person, place, and time.  Psychiatric:        Mood and Affect: Mood normal.        Behavior: Behavior normal.        Thought Content: Thought content normal.        Judgment: Judgment  normal.     Imaging: US  Abdomen Limited RUQ (LIVER/GB) Result Date:  04/21/2024 CLINICAL DATA:  Cirrhosis.  HCC screening. EXAM: ULTRASOUND ABDOMEN LIMITED RIGHT UPPER QUADRANT COMPARISON:  12/23/2023 FINDINGS: Gallbladder: Gallstones: None Sludge: None Gallbladder Wall: Within normal limits Pericholecystic fluid: None Sonographic Murphy's Sign: Negative per technologist Common bile duct: Diameter: 4 mm Liver: Parenchymal echogenicity: Diffusely coarsened Contours: Nodular Lesions: None Portal vein: Patent.  Hepatopetal flow Other: None. IMPRESSION: Cirrhotic liver morphology without focal hepatic lesion. Electronically Signed   By: Aliene Lloyd M.D.   On: 04/21/2024 14:14    Labs:  CBC: Recent Labs    01/25/24 1259 03/22/24 1057 04/26/24 1115 05/03/24 0817  WBC 5.1 4.6 3.7* 5.6  HGB 7.7* 9.2* 6.9* 8.2*  HCT 26.8* 29.7* 23.3* 27.1*  PLT 149* 138* 146* 119*    COAGS: Recent Labs    12/23/23 1900 04/13/24 0936  INR 1.2 1.1*  APTT 24  --     BMP: Recent Labs    12/27/23 0511 01/06/24 1427 01/25/24 1259 03/22/24 1057 04/26/24 1115  NA 138 141 144 141 142  K 3.6 4.2 3.9 3.9 4.0  CL 108 106 109 107 109  CO2 24 29 26 24 23   GLUCOSE 299* 142* 105* 179* 224*  BUN 10 12 13 15 12   CALCIUM  8.2* 9.2 9.7 9.9 9.2  CREATININE 1.02* 1.00 1.09* 1.10* 1.12*  GFRNONAA 57*  --  52* 51* 50*    LIVER FUNCTION TESTS: Recent Labs    12/24/23 1404 01/25/24 1259 03/22/24 1057 04/26/24 1115  BILITOT 4.6* 0.6 0.7 0.5  AST 24 28 27 28   ALT 18 19 23 22   ALKPHOS 82 120 106 126  PROT 5.9* 6.9 6.6 6.1*  ALBUMIN  3.1* 4.0 4.1 3.7    TUMOR MARKERS: Recent Labs    04/13/24 0936  AFPTM 1.7    Assessment and Plan:  Request for  image guided image guided R port insertion approved for 10/14.H/o L mastectomy, planned R port.   No contraindications for procedure identified in ROS, physical exam, or review of pre-sedation considerations. In short stay, her RN noted hypoglycemia 49mg /dL and contacted IR MD for D50 (see MAR). Improved to 110 mg/dL. Patient  states that she is sensitive to periods of being NPO. IR RN to recheck prior to procedure.  VSS, afebrile Patient not asked to hold any AC/AP for this low bleeding risk procedure Abx not indicated   Risks and benefits of image guided port-a-catheter placement was discussed with the patient including, but not limited to bleeding, infection, pneumothorax, or fibrin sheath development and need for additional procedures.  All of the patient's questions were answered, patient is agreeable to proceed. Consent signed and in chart.   Thank you for allowing our service to participate in Anita Mccormick 's care.    Electronically Signed: Laymon Coast, NP   05/10/2024, 1:12 PM     I spent a total of  15 Minutes   in face to face in clinical consultation, greater than 50% of which was counseling/coordinating care for image guided port insertion.    (A copy of this note was sent to the referring provider and the time of visit.)

## 2024-05-11 LAB — GLUCOSE, CAPILLARY
Glucose-Capillary: 47 mg/dL — ABNORMAL LOW (ref 70–99)
Glucose-Capillary: 105 mg/dL — ABNORMAL HIGH (ref 70–99)

## 2024-05-17 ENCOUNTER — Ambulatory Visit

## 2024-05-17 ENCOUNTER — Inpatient Hospital Stay

## 2024-05-17 ENCOUNTER — Telehealth: Payer: Self-pay

## 2024-05-17 DIAGNOSIS — D500 Iron deficiency anemia secondary to blood loss (chronic): Secondary | ICD-10-CM

## 2024-05-17 DIAGNOSIS — K5521 Angiodysplasia of colon with hemorrhage: Secondary | ICD-10-CM | POA: Diagnosis not present

## 2024-05-17 LAB — CBC WITH DIFFERENTIAL (CANCER CENTER ONLY)
Abs Immature Granulocytes: 0.02 K/uL (ref 0.00–0.07)
Basophils Absolute: 0 K/uL (ref 0.0–0.1)
Basophils Relative: 1 %
Eosinophils Absolute: 0.9 K/uL — ABNORMAL HIGH (ref 0.0–0.5)
Eosinophils Relative: 17 %
HCT: 24.6 % — ABNORMAL LOW (ref 36.0–46.0)
Hemoglobin: 7.3 g/dL — ABNORMAL LOW (ref 12.0–15.0)
Immature Granulocytes: 0 %
Lymphocytes Relative: 10 %
Lymphs Abs: 0.5 K/uL — ABNORMAL LOW (ref 0.7–4.0)
MCH: 29 pg (ref 26.0–34.0)
MCHC: 29.7 g/dL — ABNORMAL LOW (ref 30.0–36.0)
MCV: 97.6 fL (ref 80.0–100.0)
Monocytes Absolute: 0.5 K/uL (ref 0.1–1.0)
Monocytes Relative: 10 %
Neutro Abs: 3.3 K/uL (ref 1.7–7.7)
Neutrophils Relative %: 62 %
Platelet Count: 160 K/uL (ref 150–400)
RBC: 2.52 MIL/uL — ABNORMAL LOW (ref 3.87–5.11)
RDW: 14.9 % (ref 11.5–15.5)
WBC Count: 5.2 K/uL (ref 4.0–10.5)
nRBC: 0 % (ref 0.0–0.2)

## 2024-05-17 NOTE — Patient Instructions (Signed)

## 2024-05-17 NOTE — Telephone Encounter (Signed)
 Spoke with patient.

## 2024-05-17 NOTE — Telephone Encounter (Signed)
 Copied from CRM #[id number]. Topic: General - Other >> May 17, 2024 10:10 AM Corin V wrote: Reason for CRM: Patient called and is requesting a call back from [facility] prior to her 12:00 lab appointment. Advised this could not be guaranteed. She did not want to discuss needs or provide any context to reason for call back. CB: [phone]

## 2024-05-18 ENCOUNTER — Ambulatory Visit: Admitting: Family Medicine

## 2024-05-18 ENCOUNTER — Encounter: Payer: Self-pay | Admitting: Family Medicine

## 2024-05-18 VITALS — BP 136/60 | HR 90 | Temp 98.8°F | Ht 62.0 in | Wt 142.6 lb

## 2024-05-18 DIAGNOSIS — Z1231 Encounter for screening mammogram for malignant neoplasm of breast: Secondary | ICD-10-CM

## 2024-05-18 DIAGNOSIS — I10 Essential (primary) hypertension: Secondary | ICD-10-CM | POA: Diagnosis not present

## 2024-05-18 DIAGNOSIS — R051 Acute cough: Secondary | ICD-10-CM | POA: Diagnosis not present

## 2024-05-18 DIAGNOSIS — E1129 Type 2 diabetes mellitus with other diabetic kidney complication: Secondary | ICD-10-CM

## 2024-05-18 DIAGNOSIS — E785 Hyperlipidemia, unspecified: Secondary | ICD-10-CM

## 2024-05-18 DIAGNOSIS — D509 Iron deficiency anemia, unspecified: Secondary | ICD-10-CM

## 2024-05-18 DIAGNOSIS — Z7985 Long-term (current) use of injectable non-insulin antidiabetic drugs: Secondary | ICD-10-CM | POA: Diagnosis not present

## 2024-05-18 LAB — POCT GLYCOSYLATED HEMOGLOBIN (HGB A1C): Hemoglobin A1C: 5.2 % (ref 4.0–5.6)

## 2024-05-18 MED ORDER — BENZONATATE 100 MG PO CAPS: 100.0000 mg | ORAL_CAPSULE | Freq: Two times a day (BID) | ORAL | 0 refills | Status: AC | PRN

## 2024-05-18 NOTE — Progress Notes (Signed)
 Established Patient Office Visit   Subjective  Patient ID: Anita Mccormick, female    DOB: October 01, 1945  Age: 78 y.o. MRN: [id number]  Chief Complaint  Patient presents with   Medical Management of Chronic Issues    Patient came in today for 3 month follow-up for diabetes, A1c recheck      Pt is a 78 yo female seen for f/u.  Pt had PICC line removed and port-a- cath placed for iron  infusions.  States area or R upper chest is itchy.  No edmea or rash noted.  Pt taking iron  pills every other day.  Not sure they are helping.  Had labs done yesterday.  States bs has been good at home.  Wanted refills on one touch strips and lancets, but given accu chek.  States not used to the new device.  May have happened due to change in insurance.  Pt states she is tired, has SOB, and feet are hurting due to numbness and tingling.  Pt also with a cough, now dry.  Was productive.  Given a z-pak.  Cough resolved but returned after having port placed.   Patient Active Problem List   Diagnosis Date Noted   Anemia due to chronic blood loss 12/26/2023   Gastric AVM 12/26/2023   AVM (arteriovenous malformation) of small bowel, acquired 12/26/2023   Gastrointestinal hemorrhage 12/24/2023   Acute on chronic anemia 12/23/2023   Heme positive stool 11/08/2023   Non-ST elevation (NSTEMI) myocardial infarction (HCC) 10/21/2023   Malnutrition of moderate degree 10/16/2023   Acute respiratory failure with hypoxia (HCC) 10/13/2023   Influenza A 10/13/2023   Cardiac arrest (HCC) 10/13/2023   Pneumothorax on left 10/13/2023   Central retinal artery occlusion of right eye 10/25/2021   Symptomatic anemia 06/06/2021   Atherosclerosis of aorta 05/18/2020   Cirrhosis of liver without ascites (HCC) 05/18/2020   Internal hemorrhoids 06/22/2019   Chronic kidney disease (CKD), stage II (mild) 09/19/2017   Leukopenia 09/19/2017   Acute on chronic diastolic HF (heart failure) (HCC) 09/19/2017   Statins contraindicated  06/12/2017   Hepatotoxicity due to statin drug 06/12/2017   History of adenomatous polyp of colon 03/18/2017   Viral URI with cough 01/17/2017   Thrombocytopenia 12/19/2015   Insomnia 08/21/2015   Nicotine  use disorder 05/21/2015   Eczema 05/21/2015   Fatty liver disease, nonalcoholic 07/14/2014   Hyperlipidemia 11/03/2007   History of CVA (cerebrovascular accident) 10/20/2007   Type II diabetes mellitus with renal manifestations (HCC) 12/10/2006   Essential hypertension 12/10/2006   History of breast cancer 12/10/2006   Past Medical History:  Diagnosis Date   Arthritis    Cancer (HCC)    breast left   Cerebrovascular accident [facility])    October 29 2021- Left eye- blind   COLONIC POLYPS, HX OF 12/10/2006   Qualifier: Diagnosis of  By: Tammie MD, Bruce     Diabetes mellitus type II, controlled (HCC) 12/10/2006   Poor control on Januvia  100mg  and glipizide  10mg  XL Lab Results  Component Value Date   HGBA1C 8.3* 11/02/2014        Eczema    Fatty liver disease, nonalcoholic 07/14/2014   Per Dr. Tammie Lab Results  Component Value Date   ALT 31 11/02/2014   AST 57* 11/02/2014   ALKPHOS 104 11/02/2014   BILITOT 1.1 11/02/2014       GERD (gastroesophageal reflux disease)    History of blood transfusion    History of kidney stones 2023   11/28/21  small stone currently, taking flomax    Hypertension    Iron  deficiency anemia, unspecified 05/08/2023   Sepsis (HCC) 09/19/2017   Sepsis due to Escherichia coli (E. coli) (HCC)    Symptomatic anemia 06/06/2021   UTI (urinary tract infection) 09/19/2017   Vaginal discharge 04/01/2022   Past Surgical History:  Procedure Laterality Date   ARTERY BIOPSY Left 11/29/2021   Procedure: LEFT BIOPSY TEMPORAL ARTERY;  Surgeon: Eliza Lonni RAMAN, MD;  Location: [facility] OR;  Service: Vascular;  Laterality: Left;   BIOPSY OF SKIN SUBCUTANEOUS TISSUE AND/OR MUCOUS MEMBRANE  11/10/2023   Procedure: BIOPSY;  Surgeon: Federico Rosario BROCKS, MD;  Location: [facility] ENDOSCOPY;   Service: Gastroenterology;;   CATARACT EXTRACTION Bilateral    COLONOSCOPY N/A 11/10/2023   Procedure: COLONOSCOPY;  Surgeon: Federico Rosario BROCKS, MD;  Location: [facility] ENDOSCOPY;  Service: Gastroenterology;  Laterality: N/A;   ESOPHAGOGASTRODUODENOSCOPY N/A 11/10/2023   Procedure: EGD (ESOPHAGOGASTRODUODENOSCOPY);  Surgeon: Federico Rosario BROCKS, MD;  Location: [facility] ENDOSCOPY;  Service: Gastroenterology;  Laterality: N/A;   GIVENS CAPSULE STUDY N/A 12/25/2023   Procedure: IMAGING PROCEDURE, GI TRACT, INTRALUMINAL, VIA CAPSULE;  Surgeon: Legrand Victory LITTIE DOUGLAS, MD;  Location: [facility] ENDOSCOPY;  Service: Gastroenterology;  Laterality: N/A;   HOT HEMOSTASIS N/A 11/10/2023   Procedure: EGD, WITH ARGON PLASMA COAGULATION;  Surgeon: Federico Rosario BROCKS, MD;  Location: [facility] ENDOSCOPY;  Service: Gastroenterology;  Laterality: N/A;   IR IMAGING GUIDED PORT INSERTION  05/10/2024   LEFT HEART CATH AND CORONARY ANGIOGRAPHY N/A 10/20/2023   Procedure: LEFT HEART CATH AND CORONARY ANGIOGRAPHY;  Surgeon: Jordan, Peter M, MD;  Location: [facility] INVASIVE CV LAB;  Service: Cardiovascular;  Laterality: N/A;   MASTECTOMY Left    POLYPECTOMY  11/10/2023   Procedure: POLYPECTOMY, INTESTINE;  Surgeon: Federico Rosario BROCKS, MD;  Location: [facility] ENDOSCOPY;  Service: Gastroenterology;;   TUBAL LIGATION     Social History   Tobacco Use   Smoking status: Former    Current packs/day: 0.20    Average packs/day: 0.2 packs/day for 10.0 years (2.0 ttl pk-yrs)    Types: Cigarettes   Smokeless tobacco: Never  Vaping Use   Vaping status: Never Used  Substance Use Topics   Alcohol use: Not Currently   Drug use: No   Family History  Problem Relation Age of Onset   Stroke Mother    Cancer Father        prostate   Diabetes Sister    Pancreatic cancer Sister    Allergies  Allergen Reactions   Bee Venom Swelling   Penicillins Hives   Ace Inhibitors Cough    ????   Chlorthalidone     REACTION: unspecified   Lactose Intolerance (Gi) Diarrhea   Metformin       REACTION: gi side effects   Sulfamethoxazole     REACTION: questionable    ROS Negative unless stated above    Objective:     BP 136/60 (BP Location: Left Arm, Patient Position: Sitting, Cuff Size: Normal)   Pulse 90   Temp 98.8 F (37.1 C) (Oral)   Ht 5' 2 (1.575 m)   Wt 142 lb 9.6 oz (64.7 kg)   SpO2 98%   BMI 26.08 kg/m  BP Readings from Last 3 Encounters:  05/18/24 136/60  05/10/24 (!) 145/51  05/06/24 (!) 152/71   Wt Readings from Last 3 Encounters:  05/18/24 142 lb 9.6 oz (64.7 kg)  05/10/24 141 lb (64 kg)  05/03/24 141 lb 14.4 oz (64.4 kg)  Physical Exam Constitutional:      General: She is not in acute distress.    Appearance: Normal appearance.  HENT:     Head: Normocephalic and atraumatic.     Nose: Nose normal.     Mouth/Throat:     Mouth: Mucous membranes are moist.  Cardiovascular:     Rate and Rhythm: Normal rate and regular rhythm.     Heart sounds: Normal heart sounds. No murmur heard.    No gallop.  Pulmonary:     Effort: Pulmonary effort is normal. No respiratory distress.     Breath sounds: Normal breath sounds. No wheezing, rhonchi or rales.  Skin:    General: Skin is warm and dry.  Neurological:     Mental Status: She is alert and oriented to person, place, and time.        04/28/2024   10:44 AM 02/23/2024   12:36 PM 02/19/2024   11:00 AM  Depression screen PHQ 2/9  Decreased Interest 0 0 0  Down, Depressed, Hopeless 0 0 0  PHQ - 2 Score 0 0 0  Altered sleeping 0    Tired, decreased energy 3    Change in appetite 0    Feeling bad or failure about yourself  1    Trouble concentrating 0    Moving slowly or fidgety/restless 3    Suicidal thoughts 0    PHQ-9 Score 7        Data saved with a previous flowsheet row definition      11/18/2023    2:53 PM 07/06/2023    8:52 AM 10/29/2022   11:46 AM  GAD 7 : Generalized Anxiety Score  Nervous, Anxious, on Edge 3 0 0  Control/stop worrying 0 0 0  Worry too much - different  things 0 0 0  Trouble relaxing 1 0 0  Restless 0 0 0  Easily annoyed or irritable 2 0 0  Afraid - awful might happen 0 0 0  Total GAD 7 Score 6 0 0  Anxiety Difficulty Not difficult at all Not difficult at all Not difficult at all     Results for orders placed or performed in visit on 05/18/24  POC HgB A1c  Result Value Ref Range   Hemoglobin A1C 5.2 4.0 - 5.6 %   HbA1c POC (<> result, manual entry)     HbA1c, POC (prediabetic range)     HbA1c, POC (controlled diabetic range)        Assessment & Plan:   Type 2 diabetes mellitus with other diabetic kidney complication, without long-term current use of insulin  (HCC) -     Microalbumin / creatinine urine ratio -     POCT glycosylated hemoglobin (Hb A1C)  Hyperlipidemia, unspecified hyperlipidemia type  Iron  deficiency anemia, unspecified iron  deficiency anemia type  Essential hypertension  Encounter for screening mammogram for malignant neoplasm of breast -     Digital Screening Mammogram, Left and Right; Future  Acute cough -     Benzonatate ; Take 1 capsule (100 mg total) by mouth 2 (two) times daily as needed for cough.  Dispense: 20 capsule; Refill: 0  DM 2 controlled.  A1c 6.1% on 12/24/2023.  Recheck this visit 5.2%.  Has Dexcom G7 CGM.  Advised to check glucose manually if alerted to hypo or hyperglycemia.  Continue Trulicity  1.5 mg weekly.  Continue lifestyle modifications.  Discussed the importance of eating consistent meals, decreasing sugar, carb, and sodium intake.  Increase water intake.  Eye exam  done 09/21/2023.  Foot exam done 01/15/2024.  Immunizations reviewed.  Consider Tdap.  ACE-I allergy.  Continue Lipitor  80 mg daily.  Obtain microalbumin creatinine ratio this visit.  BP controlled.  Continue current medications including Toprol -XL 25 mg daily.  Lifestyle modifications.  Patient requesting mammogram due to history of L breast cancer.  Symptomatic anemia of chronic blood loss.-Fatigue and SOB with walking.  Continue follow-up with heme-onc.  Port-A-Cath in place.  Continue iron  infusions as needed.  Monitor for overt bleeding as on Plavix  for history of CVA.     Dry cough.  Possibly 2/2 postnasal drainage/allergies.  Antihistamine or nasal saline rinse as needed, warm fluids, Breztri inhaler.  Tessalon  for cough.  Notify clinic for continued or worsening symptoms.  Return in about 4 months (around 09/18/2024).   Clotilda JONELLE Single, MD

## 2024-05-18 NOTE — Patient Instructions (Addendum)
 Your hgb from yesterday was 7.3.    Your A1C was 5.2% this visit.  This is great!!!!  A prescription for Tessalon  for cough were sent to your pharmacy.

## 2024-05-25 DIAGNOSIS — I672 Cerebral atherosclerosis: Secondary | ICD-10-CM | POA: Diagnosis not present

## 2024-05-25 DIAGNOSIS — N1831 Chronic kidney disease, stage 3a: Secondary | ICD-10-CM | POA: Diagnosis not present

## 2024-05-25 DIAGNOSIS — Z8616 Personal history of COVID-19: Secondary | ICD-10-CM | POA: Diagnosis not present

## 2024-05-25 DIAGNOSIS — E1142 Type 2 diabetes mellitus with diabetic polyneuropathy: Secondary | ICD-10-CM | POA: Diagnosis not present

## 2024-05-25 DIAGNOSIS — Z9989 Dependence on other enabling machines and devices: Secondary | ICD-10-CM | POA: Diagnosis not present

## 2024-05-25 DIAGNOSIS — Z880 Allergy status to penicillin: Secondary | ICD-10-CM | POA: Diagnosis not present

## 2024-05-25 DIAGNOSIS — I700 Atherosclerosis of aorta: Secondary | ICD-10-CM | POA: Diagnosis not present

## 2024-05-25 DIAGNOSIS — I252 Old myocardial infarction: Secondary | ICD-10-CM | POA: Diagnosis not present

## 2024-05-25 DIAGNOSIS — I2510 Atherosclerotic heart disease of native coronary artery without angina pectoris: Secondary | ICD-10-CM | POA: Diagnosis not present

## 2024-05-25 DIAGNOSIS — J9611 Chronic respiratory failure with hypoxia: Secondary | ICD-10-CM | POA: Diagnosis not present

## 2024-05-25 DIAGNOSIS — Z882 Allergy status to sulfonamides status: Secondary | ICD-10-CM | POA: Diagnosis not present

## 2024-05-25 DIAGNOSIS — M2040 Other hammer toe(s) (acquired), unspecified foot: Secondary | ICD-10-CM | POA: Diagnosis not present

## 2024-05-25 DIAGNOSIS — Z9181 History of falling: Secondary | ICD-10-CM | POA: Diagnosis not present

## 2024-05-25 DIAGNOSIS — I509 Heart failure, unspecified: Secondary | ICD-10-CM | POA: Diagnosis not present

## 2024-05-25 DIAGNOSIS — Z809 Family history of malignant neoplasm, unspecified: Secondary | ICD-10-CM | POA: Diagnosis not present

## 2024-05-25 DIAGNOSIS — Z823 Family history of stroke: Secondary | ICD-10-CM | POA: Diagnosis not present

## 2024-05-25 DIAGNOSIS — Z8673 Personal history of transient ischemic attack (TIA), and cerebral infarction without residual deficits: Secondary | ICD-10-CM | POA: Diagnosis not present

## 2024-05-25 DIAGNOSIS — E1122 Type 2 diabetes mellitus with diabetic chronic kidney disease: Secondary | ICD-10-CM | POA: Diagnosis not present

## 2024-05-25 DIAGNOSIS — Z7984 Long term (current) use of oral hypoglycemic drugs: Secondary | ICD-10-CM | POA: Diagnosis not present

## 2024-05-25 DIAGNOSIS — E1151 Type 2 diabetes mellitus with diabetic peripheral angiopathy without gangrene: Secondary | ICD-10-CM | POA: Diagnosis not present

## 2024-05-25 DIAGNOSIS — D509 Iron deficiency anemia, unspecified: Secondary | ICD-10-CM | POA: Diagnosis not present

## 2024-05-25 DIAGNOSIS — I130 Hypertensive heart and chronic kidney disease with heart failure and stage 1 through stage 4 chronic kidney disease, or unspecified chronic kidney disease: Secondary | ICD-10-CM | POA: Diagnosis not present

## 2024-05-25 DIAGNOSIS — Z853 Personal history of malignant neoplasm of breast: Secondary | ICD-10-CM | POA: Diagnosis not present

## 2024-05-25 DIAGNOSIS — E785 Hyperlipidemia, unspecified: Secondary | ICD-10-CM | POA: Diagnosis not present

## 2024-05-28 ENCOUNTER — Other Ambulatory Visit: Payer: Self-pay | Admitting: Family Medicine

## 2024-05-28 DIAGNOSIS — E162 Hypoglycemia, unspecified: Secondary | ICD-10-CM

## 2024-05-28 DIAGNOSIS — E1129 Type 2 diabetes mellitus with other diabetic kidney complication: Secondary | ICD-10-CM

## 2024-05-31 ENCOUNTER — Encounter: Payer: Self-pay | Admitting: Oncology

## 2024-05-31 ENCOUNTER — Inpatient Hospital Stay: Attending: Oncology

## 2024-05-31 ENCOUNTER — Inpatient Hospital Stay

## 2024-05-31 ENCOUNTER — Inpatient Hospital Stay: Admitting: Oncology

## 2024-05-31 VITALS — BP 152/51 | HR 77 | Temp 98.4°F | Resp 18 | Ht 62.0 in | Wt 139.5 lb

## 2024-05-31 DIAGNOSIS — E119 Type 2 diabetes mellitus without complications: Secondary | ICD-10-CM | POA: Insufficient documentation

## 2024-05-31 DIAGNOSIS — K7460 Unspecified cirrhosis of liver: Secondary | ICD-10-CM | POA: Insufficient documentation

## 2024-05-31 DIAGNOSIS — I252 Old myocardial infarction: Secondary | ICD-10-CM | POA: Insufficient documentation

## 2024-05-31 DIAGNOSIS — Z882 Allergy status to sulfonamides status: Secondary | ICD-10-CM | POA: Insufficient documentation

## 2024-05-31 DIAGNOSIS — Z8673 Personal history of transient ischemic attack (TIA), and cerebral infarction without residual deficits: Secondary | ICD-10-CM | POA: Insufficient documentation

## 2024-05-31 DIAGNOSIS — D500 Iron deficiency anemia secondary to blood loss (chronic): Secondary | ICD-10-CM | POA: Diagnosis not present

## 2024-05-31 DIAGNOSIS — Z880 Allergy status to penicillin: Secondary | ICD-10-CM | POA: Insufficient documentation

## 2024-05-31 DIAGNOSIS — Z7982 Long term (current) use of aspirin: Secondary | ICD-10-CM | POA: Insufficient documentation

## 2024-05-31 DIAGNOSIS — K5521 Angiodysplasia of colon with hemorrhage: Secondary | ICD-10-CM | POA: Insufficient documentation

## 2024-05-31 DIAGNOSIS — Q2730 Arteriovenous malformation, site unspecified: Secondary | ICD-10-CM | POA: Insufficient documentation

## 2024-05-31 DIAGNOSIS — Z7902 Long term (current) use of antithrombotics/antiplatelets: Secondary | ICD-10-CM | POA: Insufficient documentation

## 2024-05-31 DIAGNOSIS — Z9012 Acquired absence of left breast and nipple: Secondary | ICD-10-CM | POA: Insufficient documentation

## 2024-05-31 DIAGNOSIS — D696 Thrombocytopenia, unspecified: Secondary | ICD-10-CM | POA: Insufficient documentation

## 2024-05-31 DIAGNOSIS — I2510 Atherosclerotic heart disease of native coronary artery without angina pectoris: Secondary | ICD-10-CM | POA: Insufficient documentation

## 2024-05-31 DIAGNOSIS — Z79899 Other long term (current) drug therapy: Secondary | ICD-10-CM | POA: Insufficient documentation

## 2024-05-31 DIAGNOSIS — Z8674 Personal history of sudden cardiac arrest: Secondary | ICD-10-CM | POA: Insufficient documentation

## 2024-05-31 DIAGNOSIS — M96A2 Fracture of one rib associated with chest compression and cardiopulmonary resuscitation: Secondary | ICD-10-CM | POA: Insufficient documentation

## 2024-05-31 DIAGNOSIS — K760 Fatty (change of) liver, not elsewhere classified: Secondary | ICD-10-CM | POA: Insufficient documentation

## 2024-05-31 DIAGNOSIS — I110 Hypertensive heart disease with heart failure: Secondary | ICD-10-CM | POA: Insufficient documentation

## 2024-05-31 DIAGNOSIS — Z853 Personal history of malignant neoplasm of breast: Secondary | ICD-10-CM | POA: Insufficient documentation

## 2024-05-31 LAB — CBC WITH DIFFERENTIAL (CANCER CENTER ONLY)
Abs Immature Granulocytes: 0.01 K/uL (ref 0.00–0.07)
Basophils Absolute: 0 K/uL (ref 0.0–0.1)
Basophils Relative: 1 %
Eosinophils Absolute: 1 K/uL — ABNORMAL HIGH (ref 0.0–0.5)
Eosinophils Relative: 17 %
HCT: 26.3 % — ABNORMAL LOW (ref 36.0–46.0)
Hemoglobin: 7.6 g/dL — ABNORMAL LOW (ref 12.0–15.0)
Immature Granulocytes: 0 %
Lymphocytes Relative: 11 %
Lymphs Abs: 0.6 K/uL — ABNORMAL LOW (ref 0.7–4.0)
MCH: 27.9 pg (ref 26.0–34.0)
MCHC: 28.9 g/dL — ABNORMAL LOW (ref 30.0–36.0)
MCV: 96.7 fL (ref 80.0–100.0)
Monocytes Absolute: 0.6 K/uL (ref 0.1–1.0)
Monocytes Relative: 10 %
Neutro Abs: 3.6 K/uL (ref 1.7–7.7)
Neutrophils Relative %: 61 %
Platelet Count: 132 K/uL — ABNORMAL LOW (ref 150–400)
RBC: 2.72 MIL/uL — ABNORMAL LOW (ref 3.87–5.11)
RDW: 14.6 % (ref 11.5–15.5)
WBC Count: 5.8 K/uL (ref 4.0–10.5)
nRBC: 0 % (ref 0.0–0.2)

## 2024-05-31 LAB — FERRITIN: Ferritin: 25 ng/mL (ref 11–307)

## 2024-05-31 LAB — IRON AND TIBC
Iron: 21 ug/dL — ABNORMAL LOW (ref 28–170)
Saturation Ratios: 5 % — ABNORMAL LOW (ref 10.4–31.8)
TIBC: 426 ug/dL (ref 250–450)
UIBC: 405 ug/dL

## 2024-05-31 NOTE — Progress Notes (Signed)
 [facility]  HEMATOLOGY CLINIC PROGRESS NOTE  PATIENT NAME: Anita Mccormick   MR#: [id number] DOB: 09-21-1945  Patient Care Team: Mercer Clotilda SAUNDERS, MD as PCP - General (Family Medicine) Jordan, Peter M, MD as PCP - Cardiology (Cardiology) Octavia Bruckner, MD as Consulting Physician (Ophthalmology) Lionell Jon Mccormick, [facility] (Pharmacist)  Date of visit: 05/31/2024   ASSESSMENT & PLAN:   Anita Mccormick is a 78 y.o. lady with a past medical history of acute on chronic anemia due to gastrointestinal bleeding, history of gastric and small bowel AVMs/telangiectasias, liver cirrhosis, CAD s/p PEA arrest and NSTEMI during hospitalization in 09/2023, congestive heart failure, history of CVA, diabetes mellitus, hypertension, Hx of left breast cancer in early 2000s, s/p mastectomy and axillary LN dissection, did not receive adjuvant chemo, was referred to our service in June 2025 for evaluation of iron  deficiency anemia.     Anemia due to chronic blood loss Chronic anemia with a history of low hemoglobin levels, previously as low as 5.4 in May 2025, improved to 8.1 after transfusions.  Likely due to gastrointestinal bleeding causing slow blood loss.   Currently on oral iron  supplements (Fergon) every other day, tolerated well without constipation.   Occasional black stools and minor blood traces when wiping reported, possibly due to hemorrhoids, though she denies a history of hemorrhoids.  On her consultation with us  on 01/25/2024, labs revealed persistent anemia with hemoglobin of 7.7, MCV 97.  White count and platelet count were normal. Iron  studies continued to show evidence of iron  deficiency with iron  saturation of 5%, iron  decreased to 23.  B12, folate were within normal limits.  LDH borderline at 217.   Given persistent iron  deficiency anemia despite oral iron  supplementation, we proceeded with IV iron  using Venofer  x 5 doses, last dose on 03/01/2024.  On 04/26/2024, workup was  negative for monoclonal gammopathy including SPEP, serum free light chains.  It is likely that ongoing anemia is from occult GI blood loss from AVMs.  Hemoglobin is 7.6, within the target range. Symptoms of fatigue and dyspnea are attributed to anemia.  Iron  labs are pending, but previous results were satisfactory. Oral iron  supplementation is ongoing, with constipation managed by stool softeners. Transfusions are minimized to prevent future rejection due to minor antigens. - Continue oral iron  supplementation once every other day. - Monitor hemoglobin levels every two weeks. - Will consider intravenous iron  if iron  levels are low. - Scheduled follow-up in six weeks.  - Continue oral iron  supplements (Fergon) every other day.   Thrombocytopenia Platelet count is slightly low at 132,000, fluctuating over time. Possible relation to liver issues. White blood cell count is normal.  Arteriovenous malformations of gastrointestinal tract Capsule endoscopy previously identified small AVMs in the gastrointestinal tract, which may intermittently bleed and contribute to anemia. She is unaware of any overt bleeding, but chronic blood loss from these AVMs is a concern.  Cirrhosis of liver Recent ultrasound indicates cirrhosis of the liver, previously diagnosed as fatty liver. Denies alcohol use, suggesting non-alcoholic causes such as fatty liver disease or other etiologies. Hepatitis testing was last performed in 2014, and it is unclear if recent testing has been done by the GI team.  I spent a total of 30 minutes during this encounter with the patient including review of chart and various tests results, discussions about plan of care and coordination of care plan.  I reviewed lab results and outside records for this visit and discussed relevant results with the  patient. Diagnosis, plan of care and treatment options were also discussed in detail with the patient. Opportunity provided to ask questions and  answers provided to her apparent satisfaction. Provided instructions to call our clinic with any problems, questions or concerns prior to return visit. I recommended to continue follow-up with PCP and sub-specialists. She verbalized understanding and agreed with the plan. No barriers to learning was detected.  Chinita Patten, MD  05/31/2024 6:01 PM  [facility] - A DEPT OF Anita Mccormick. [facility] [address] [place] [address] Dept: [phone] Dept Fax: [phone]   CHIEF COMPLAINT/ REASON FOR VISIT:  Follow-up for chronic anemia, likely from occult GI blood loss.  Treated with IV iron   INTERVAL HISTORY:  Discussed the use of AI scribe software for clinical note transcription with the patient, who gave verbal consent to proceed.  History of Present Illness [name] is a 78 year old female with anemia who presents for follow-up regarding her anemia management.  She has experienced difficulty with blood draw from her port, which was placed three weeks ago. Initially, blood was drawn successfully, but during a recent attempt, the flow stopped. She suspects dehydration might be a factor and has been performing various arm movements to facilitate blood flow.  She is currently taking oral iron  supplements every other day to manage her anemia. She experiences occasional constipation as a side effect, which she manages with a stool softener. Her hemoglobin level is 7.6. She has been undergoing regular blood work every two weeks to monitor her condition.  She recently completed a course of antibiotics for a persistent cough, which has since resolved. She was also prescribed a cough suppressant, Tessalon  Pearl, which she took as needed.  She experiences fatigue and shortness of breath. She has a history of fatty liver and cirrhosis, which she believes is non-alcoholic in nature. Her platelet count is currently  132,000.   SUMMARY OF HEMATOLOGIC HISTORY:  She was hospitalized 5/28-12/27/23 for acute on chronic anemia due to GIB.  Patient given 2 units PRBCs.  Hemoglobin improved to 9.0 at time of discharge.  Patient with history of AVM/angioectasis noted on EGD 4/15.  GI consulted.  Capsule endoscopy 12/25/2023.  No active bleeding appreciated but relatively fast transit time could not limit small findings such as AVMs.  Endoscopic intervention was not amenable.  She was advised to continue iron  supplementation and referral was sent to us  for further evaluation and management of iron  deficiency anemia.   She has undergone endoscopic evaluations and iron  infusions, though she experiences difficulty with venous access during these procedures, requiring multiple attempts to establish an IV line.   Her hemoglobin level improved to 8.1 g/dL in June 2025 from a previous low of 5.4 g/dL following transfusions. She experiences dizziness, shortness of breath, and coughing. No current chest pain, but ongoing shortness of breath is noted.   She takes Fergon, an iron  supplement, every other day as prescribed since her last hospital visit, and tolerates it well without constipation. She notes black stools and occasional traces of blood when wiping, but denies any history of hemorrhoids, epistaxis, or gum bleeding.   Her past medical history includes breast cancer treated with surgery approximately 20 years ago, without chemotherapy or radiation. She had implants placed and later removed due to rupture. She does not recall taking hormone-blocking medications post-surgery.   In March 2025, she experienced a rib fracture and lung puncture during CPR, which  required a chest tube. She expresses gratitude towards the medical staff involved in her care during that incident.  Chronic anemia with a history of low hemoglobin levels, previously as low as 5.4 in May 2025, improved to 8.1 after transfusions.   Likely due to  gastrointestinal bleeding causing slow blood loss.    Currently on oral iron  supplements (Fergon) every other day, tolerating well without constipation.    Occasional black stools and minor blood traces when wiping reported, possibly due to hemorrhoids, though she denies a history of hemorrhoids.   On her consultation with us  on 01/25/2024, labs revealed persistent anemia with hemoglobin of 7.7, MCV 97.  White count and platelet count were normal. Iron  studies continued to show evidence of iron  deficiency with iron  saturation of 5%, iron  decreased to 23.  B12, folate were within normal limits.  LDH borderline at 217.   Given persistent iron  deficiency anemia despite oral iron  supplementation, we proceeded with IV iron  using Venofer  x 5 doses.  On 04/26/2024, workup was negative for monoclonal gammopathy including SPEP, serum free light chains    I have reviewed the past medical history, past surgical history, social history and family history with the patient and they are unchanged from previous note.  ALLERGIES: She is allergic to bee venom, penicillins, ace inhibitors, chlorthalidone, lactose intolerance (gi), metformin , and sulfamethoxazole.  MEDICATIONS:  Current Outpatient Medications  Medication Sig Dispense Refill   ACCU-CHEK GUIDE TEST test strip 1 each by Other route 3 (three) times daily.     acetaminophen  (TYLENOL ) 325 MG tablet Take 2 tablets (650 mg total) by mouth every 6 (six) hours as needed for mild pain (pain score 1-3) (or Fever >/= 101).     aspirin  EC 81 MG tablet Take 81 mg by mouth daily. Swallow whole.     atorvastatin  (LIPITOR ) 80 MG tablet Take 1 tablet (80 mg total) by mouth daily. 90 tablet 3   azithromycin  (ZITHROMAX ) 250 MG tablet Take 2 tablets by mouth on day 1, then one tablet daily until complete. 6 each 0   benzonatate  (TESSALON ) 100 MG capsule Take 1 capsule (100 mg total) by mouth 2 (two) times daily as needed for cough. 20 capsule 0   Blood Glucose  Monitoring Suppl (ACCU-CHEK GUIDE ME) w/Device KIT Inject 1 Units into the skin 3 (three) times daily.     Budeson-Glycopyrrol-Formoterol  (BREZTRI AEROSPHERE IN) Inhale 2 puffs into the lungs 2 (two) times daily.     clopidogrel  (PLAVIX ) 75 MG tablet Take 1 tablet (75 mg total) by mouth daily. 90 tablet 2   Continuous Glucose Sensor (DEXCOM G7 SENSOR) MISC CHANGE SENSOR EVERY 10 DAYS AS DIRECTED 9 each 3   Dulaglutide  (TRULICITY ) 1.5 MG/0.5ML SOAJ Inject 1.5 mg into the skin once a week.     ferrous gluconate  (FERGON) 324 MG tablet Take 1 tablet (324 mg total) by mouth every other day.     glipiZIDE  (GLUCOTROL  XL) 10 MG 24 hr tablet TAKE 1 TABLET TWICE DAILY 180 tablet 3   glucose 4 GM chewable tablet Chew 1 tablet by mouth as needed for low blood sugar.     metoprolol  succinate (TOPROL -XL) 25 MG 24 hr tablet Take 1 tablet (25 mg total) by mouth daily. 90 tablet 1   Multiple Vitamin (MULTIVITAMIN) tablet Take 1 tablet by mouth daily.     nitroGLYCERIN  (NITROSTAT ) 0.4 MG SL tablet Place 1 tablet (0.4 mg total) under the tongue every 5 (five) minutes as needed for chest pain. 10  tablet 5   pantoprazole  (PROTONIX ) 40 MG tablet Take 1 tablet (40 mg total) by mouth daily. 90 tablet 3   No current facility-administered medications for this visit.     REVIEW OF SYSTEMS:    Review of Systems - Oncology  All other pertinent systems were reviewed with the patient and are negative.  PHYSICAL EXAMINATION:    Onc Performance Status - 05/31/24 1459       ECOG Perf Status   ECOG Perf Status Ambulatory and capable of all selfcare but unable to carry out any work activities.  Up and about more than 50% of waking hours      KPS SCALE   KPS % SCORE Cares for self, unable to carry on normal activity or to do active work           Vitals:   05/31/24 1453 05/31/24 1505  BP: (!) 169/65 (!) 152/51  Pulse: 77   Resp: 18   Temp: 98.4 F (36.9 C)   SpO2: 100%      Filed Weights   05/31/24  1453  Weight: 139 lb 8 oz (63.3 kg)     Physical Exam Constitutional:      General: She is not in acute distress.    Appearance: Normal appearance.  HENT:     Head: Normocephalic and atraumatic.  Cardiovascular:     Rate and Rhythm: Normal rate.  Pulmonary:     Effort: Pulmonary effort is normal. No respiratory distress.  Chest:     Comments: Port-A-Cath in place without any signs of infection. Abdominal:     General: There is no distension.  Neurological:     General: No focal deficit present.     Mental Status: She is alert and oriented to person, place, and time.  Psychiatric:        Mood and Affect: Mood normal.        Behavior: Behavior normal.     LABORATORY DATA:   I have reviewed the data as listed.  Results for orders placed or performed in visit on 05/31/24  Ferritin  Result Value Ref Range   Ferritin 25 11 - 307 ng/mL  CBC with Differential (Cancer Center Only)  Result Value Ref Range   WBC Count 5.8 4.0 - 10.5 K/uL   RBC 2.72 (L) 3.87 - 5.11 MIL/uL   Hemoglobin 7.6 (L) 12.0 - 15.0 g/dL   HCT 73.6 (L) 63.9 - 53.9 %   MCV 96.7 80.0 - 100.0 fL   MCH 27.9 26.0 - 34.0 pg   MCHC 28.9 (L) 30.0 - 36.0 g/dL   RDW 85.3 88.4 - 84.4 %   Platelet Count 132 (L) 150 - 400 K/uL   nRBC 0.0 0.0 - 0.2 %   Neutrophils Relative % 61 %   Neutro Abs 3.6 1.7 - 7.7 K/uL   Lymphocytes Relative 11 %   Lymphs Abs 0.6 (L) 0.7 - 4.0 K/uL   Monocytes Relative 10 %   Monocytes Absolute 0.6 0.1 - 1.0 K/uL   Eosinophils Relative 17 %   Eosinophils Absolute 1.0 (H) 0.0 - 0.5 K/uL   Basophils Relative 1 %   Basophils Absolute 0.0 0.0 - 0.1 K/uL   Immature Granulocytes 0 %   Abs Immature Granulocytes 0.01 0.00 - 0.07 K/uL       RADIOGRAPHIC STUDIES:  IR IMAGING GUIDED PORT INSERTION INDICATION: 78 year old with chronic anemia and requires frequent blood draws and infusions. Request for a Port-A-Cath.  EXAM: FLUOROSCOPIC AND ULTRASOUND  GUIDED PLACEMENT OF A SUBCUTANEOUS  PORT  MEDICATIONS: Moderate sedation  ANESTHESIA/SEDATION: Moderate (conscious) sedation was employed during this procedure. A total of Versed  2 mg and fentanyl  100 mcg was administered intravenously at the order of the provider performing the procedure.  Total intra-service moderate sedation time: 26 minutes.  Patient's level of consciousness and vital signs were monitored continuously by radiology nurse throughout the procedure under the supervision of the provider performing the procedure.  FLUOROSCOPY TIME:  Radiation Exposure Index (as provided by the fluoroscopic device): 1 mGy Kerma  COMPLICATIONS: None immediate.  PROCEDURE: The procedure, risks, benefits, and alternatives were explained to the patient. Questions regarding the procedure were encouraged and answered. The patient understands and consents to the procedure.  Patient was placed supine on the interventional table. Ultrasound confirmed a patent right internal jugular vein. Ultrasound image was saved for documentation. The right chest and neck were cleaned with a skin antiseptic and a sterile drape was placed. Maximal barrier sterile technique was utilized including caps, mask, sterile gowns, sterile gloves, sterile drape, hand hygiene and skin antiseptic. The right neck was anesthetized with 1% lidocaine . Small incision was made in the right neck with a blade. Micropuncture set was placed in the right internal jugular vein with ultrasound guidance. The micropuncture wire was used for measurement purposes. The right chest was anesthetized with 1% lidocaine  with epinephrine . #15 blade was used to make an incision and a subcutaneous port pocket was formed. 8 french Power Port was assembled. Subcutaneous tunnel was formed with a stiff tunneling device. The port catheter was brought through the subcutaneous tunnel. The port was placed in the subcutaneous pocket. The micropuncture set was exchanged for  a peel-away sheath. The catheter was placed through the peel-away sheath and the tip was positioned at the superior cavoatrial junction. Catheter placement was confirmed with fluoroscopy. The port was accessed and flushed with heparinized saline. The port pocket was closed using two layers of absorbable sutures and Dermabond. The vein skin site was closed using a single layer of absorbable suture and Dermabond. Sterile dressings were applied. Patient tolerated the procedure well without an immediate complication. Ultrasound and fluoroscopic images were taken and saved for this procedure.  IMPRESSION: Placement of a subcutaneous power-injectable port device. Catheter tip at the superior cavoatrial junction.  Electronically Signed   By: Juliene Balder M.D.   On: 05/10/2024 20:20    Orders Placed This Encounter  Procedures   CBC with Differential (Cancer Center Only)    Standing Status:   Standing    Number of Occurrences:   5    Expiration Date:   05/31/2025   Iron  and TIBC    Standing Status:   Future    Expected Date:   07/12/2024    Expiration Date:   05/31/2025   Ferritin    Standing Status:   Future    Expected Date:   07/12/2024    Expiration Date:   05/31/2025     Future Appointments  Date Time Provider Department Center  06/14/2024  2:00 PM [facility] None  06/28/2024  2:00 PM [facility] None  07/12/2024  2:30 PM [facility] None  07/12/2024  3:00 PM Jayleon Mcfarlane, MD [facility] None  07/13/2024  9:45 AM Gaynel Delon CROME, DPM [facility]  09/21/2024 11:00 AM Mercer Clotilda SAUNDERS, MD [facility] [address]  03/22/2025 12:20 PM Alvia Norleen BIRCH, MD [facility] None    This document was completed utilizing speech recognition software. Grammatical  errors, random word insertions, pronoun errors, and incomplete sentences are an occasional consequence of this system due to software limitations, ambient noise, and hardware  issues. Any formal questions or concerns about the content, text or information contained within the body of this dictation should be directly addressed to the provider for clarification.

## 2024-05-31 NOTE — Assessment & Plan Note (Addendum)
 Chronic anemia with a history of low hemoglobin levels, previously as low as 5.4 in May 2025, improved to 8.1 after transfusions.  Likely due to gastrointestinal bleeding causing slow blood loss.   Currently on oral iron  supplements (Fergon) every other day, tolerated well without constipation.   Occasional black stools and minor blood traces when wiping reported, possibly due to hemorrhoids, though she denies a history of hemorrhoids.  On her consultation with us  on 01/25/2024, labs revealed persistent anemia with hemoglobin of 7.7, MCV 97.  White count and platelet count were normal. Iron  studies continued to show evidence of iron  deficiency with iron  saturation of 5%, iron  decreased to 23.  B12, folate were within normal limits.  LDH borderline at 217.   Given persistent iron  deficiency anemia despite oral iron  supplementation, we proceeded with IV iron  using Venofer  x 5 doses, last dose on 03/01/2024.  On 04/26/2024, workup was negative for monoclonal gammopathy including SPEP, serum free light chains.  It is likely that ongoing anemia is from occult GI blood loss from AVMs.  Hemoglobin is 7.6, within the target range. Symptoms of fatigue and dyspnea are attributed to anemia.  Iron  labs are pending, but previous results were satisfactory. Oral iron  supplementation is ongoing, with constipation managed by stool softeners. Transfusions are minimized to prevent future rejection due to minor antigens. - Continue oral iron  supplementation once every other day. - Monitor hemoglobin levels every two weeks. - Will consider intravenous iron  if iron  levels are low. - Scheduled follow-up in six weeks.  - Continue oral iron  supplements (Fergon) every other day.

## 2024-05-31 NOTE — Patient Instructions (Signed)

## 2024-06-03 ENCOUNTER — Encounter: Payer: Self-pay | Admitting: Oncology

## 2024-06-07 ENCOUNTER — Other Ambulatory Visit: Payer: Self-pay

## 2024-06-07 MED ORDER — BREZTRI AEROSPHERE 160-9-4.8 MCG/ACT IN AERO
2.0000 | INHALATION_SPRAY | Freq: Two times a day (BID) | RESPIRATORY_TRACT | 11 refills | Status: DC
Start: 2024-06-07 — End: 2024-06-08

## 2024-06-08 ENCOUNTER — Other Ambulatory Visit: Payer: Self-pay

## 2024-06-08 MED ORDER — BREZTRI AEROSPHERE 160-9-4.8 MCG/ACT IN AERO: 2.0000 | INHALATION_SPRAY | Freq: Two times a day (BID) | RESPIRATORY_TRACT | 3 refills | Status: AC

## 2024-06-10 ENCOUNTER — Telehealth: Payer: Self-pay

## 2024-06-10 ENCOUNTER — Ambulatory Visit

## 2024-06-10 DIAGNOSIS — E1129 Type 2 diabetes mellitus with other diabetic kidney complication: Secondary | ICD-10-CM

## 2024-06-10 NOTE — Telephone Encounter (Signed)
 Copied from CRM [id number]. Topic: General - Other >> Jun 10, 2024  8:31 AM Anita Mccormick wrote: Reason for CRM: Patient would like [name] (cma) to give her a call about her Continuous Glucose Sensor (DEXCOM G7 SENSOR) MISC.

## 2024-06-10 NOTE — Telephone Encounter (Signed)
 Copied from CRM #[id number]. Topic: Clinical - Prescription Issue >> Jun 10, 2024  8:36 AM Joesph NOVAK wrote: Reason for CRM: Patient is calling in regards to her  Continuous Glucose Sensor (DEXCOM G7 SENSOR) MISC. She states her has not received her sensor from [facility].

## 2024-06-10 NOTE — Telephone Encounter (Signed)
 Patient was seen in office

## 2024-06-10 NOTE — Progress Notes (Signed)
 Patient presents for a nurses visit to remove and replace from the right arm the Dexcom G7 sensor.  Patient also had 2 right side shoulder bandages changed, area was cleaned and new bandages have been applied per Dr. Mercer , were was not redness or swelling in the Area

## 2024-06-14 ENCOUNTER — Inpatient Hospital Stay

## 2024-06-14 DIAGNOSIS — D500 Iron deficiency anemia secondary to blood loss (chronic): Secondary | ICD-10-CM

## 2024-06-14 LAB — CBC WITH DIFFERENTIAL (CANCER CENTER ONLY)
Abs Immature Granulocytes: 0.01 K/uL (ref 0.00–0.07)
Basophils Absolute: 0 K/uL (ref 0.0–0.1)
Basophils Relative: 1 %
Eosinophils Absolute: 1 K/uL — ABNORMAL HIGH (ref 0.0–0.5)
Eosinophils Relative: 18 %
HCT: 25.1 % — ABNORMAL LOW (ref 36.0–46.0)
Hemoglobin: 7.2 g/dL — ABNORMAL LOW (ref 12.0–15.0)
Immature Granulocytes: 0 %
Lymphocytes Relative: 11 %
Lymphs Abs: 0.6 K/uL — ABNORMAL LOW (ref 0.7–4.0)
MCH: 27.3 pg (ref 26.0–34.0)
MCHC: 28.7 g/dL — ABNORMAL LOW (ref 30.0–36.0)
MCV: 95.1 fL (ref 80.0–100.0)
Monocytes Absolute: 0.5 K/uL (ref 0.1–1.0)
Monocytes Relative: 10 %
Neutro Abs: 3.3 K/uL (ref 1.7–7.7)
Neutrophils Relative %: 60 %
Platelet Count: 144 K/uL — ABNORMAL LOW (ref 150–400)
RBC: 2.64 MIL/uL — ABNORMAL LOW (ref 3.87–5.11)
RDW: 15.3 % (ref 11.5–15.5)
WBC Count: 5.5 K/uL (ref 4.0–10.5)
nRBC: 0 % (ref 0.0–0.2)

## 2024-06-17 ENCOUNTER — Telehealth: Payer: Self-pay

## 2024-06-17 NOTE — Telephone Encounter (Signed)
 Patient's next appointment is 07/13/24 - she called and left a message, her toes are very sore and she would like to get in as soon as possible for nail trim. [phone]

## 2024-06-20 ENCOUNTER — Telehealth: Payer: Self-pay | Admitting: Oncology

## 2024-06-20 ENCOUNTER — Other Ambulatory Visit (HOSPITAL_BASED_OUTPATIENT_CLINIC_OR_DEPARTMENT_OTHER): Payer: Self-pay

## 2024-06-20 ENCOUNTER — Inpatient Hospital Stay

## 2024-06-20 DIAGNOSIS — D500 Iron deficiency anemia secondary to blood loss (chronic): Secondary | ICD-10-CM

## 2024-06-20 LAB — CBC WITH DIFFERENTIAL (CANCER CENTER ONLY)
Abs Immature Granulocytes: 0.01 K/uL (ref 0.00–0.07)
Basophils Absolute: 0 K/uL (ref 0.0–0.1)
Basophils Relative: 1 %
Eosinophils Absolute: 0.9 K/uL — ABNORMAL HIGH (ref 0.0–0.5)
Eosinophils Relative: 18 %
HCT: 25.1 % — ABNORMAL LOW (ref 36.0–46.0)
Hemoglobin: 7.3 g/dL — ABNORMAL LOW (ref 12.0–15.0)
Immature Granulocytes: 0 %
Lymphocytes Relative: 11 %
Lymphs Abs: 0.5 K/uL — ABNORMAL LOW (ref 0.7–4.0)
MCH: 27.5 pg (ref 26.0–34.0)
MCHC: 29.1 g/dL — ABNORMAL LOW (ref 30.0–36.0)
MCV: 94.7 fL (ref 80.0–100.0)
Monocytes Absolute: 0.4 K/uL (ref 0.1–1.0)
Monocytes Relative: 9 %
Neutro Abs: 3 K/uL (ref 1.7–7.7)
Neutrophils Relative %: 61 %
Platelet Count: 141 K/uL — ABNORMAL LOW (ref 150–400)
RBC: 2.65 MIL/uL — ABNORMAL LOW (ref 3.87–5.11)
RDW: 15.3 % (ref 11.5–15.5)
WBC Count: 4.9 K/uL (ref 4.0–10.5)
nRBC: 0 % (ref 0.0–0.2)

## 2024-06-20 NOTE — Patient Instructions (Signed)

## 2024-06-20 NOTE — Telephone Encounter (Signed)
 Called PT to confirm updated appt time. Time confirmed.

## 2024-06-21 ENCOUNTER — Telehealth: Payer: Self-pay

## 2024-06-21 ENCOUNTER — Ambulatory Visit (INDEPENDENT_AMBULATORY_CARE_PROVIDER_SITE_OTHER): Admitting: Podiatry

## 2024-06-21 ENCOUNTER — Encounter: Payer: Self-pay | Admitting: Podiatry

## 2024-06-21 DIAGNOSIS — E1149 Type 2 diabetes mellitus with other diabetic neurological complication: Secondary | ICD-10-CM

## 2024-06-21 DIAGNOSIS — L84 Corns and callosities: Secondary | ICD-10-CM

## 2024-06-21 DIAGNOSIS — B351 Tinea unguium: Secondary | ICD-10-CM | POA: Diagnosis not present

## 2024-06-21 DIAGNOSIS — M79675 Pain in left toe(s): Secondary | ICD-10-CM

## 2024-06-21 DIAGNOSIS — M79674 Pain in right toe(s): Secondary | ICD-10-CM | POA: Diagnosis not present

## 2024-06-21 NOTE — Telephone Encounter (Signed)
 Followed up with patient regarding her hemoglobin being stable at 7.3.  The patient was informed that she does not need a blood transfusion today per Dr. Autumn based on these results.  Patient's next appointments also reviewed. The patient understood all the information and agreed

## 2024-06-26 NOTE — Progress Notes (Signed)
 Subjective:  Patient ID: Anita Mccormick, female    DOB: 23-Jun-1946,  MRN: [id number]  Anita Mccormick presents to clinic today for at risk foot care with history of diabetic neuropathy and corn(s) b/l feet, callus(es) right foot and painful elongated, discolored, dystrophic nails.  Pain interferes with ambulation. Aggravating factors include wearing enclosed shoe gear. Painful toenails interfere with ambulation. Aggravating factors include wearing enclosed shoe gear. Pain is relieved with periodic professional debridement. Painful corns and calluses are aggravated when weightbearing with and without shoegear. Pain is relieved with periodic professional debridement.  Chief Complaint  Patient presents with   RFC    RFC. Diabetic A1c 5.2. 81mg  Asprin. R 5th toe callus very sore. Anita Clotilda SAUNDERS, MD (PCP), 05/18/24    New problem(s): None.   PCP is Anita Clotilda SAUNDERS, MD.  Allergies  Allergen Reactions   Bee Venom Swelling   Penicillins Hives   Ace Inhibitors Cough    ????   Chlorthalidone     REACTION: unspecified   Lactose Intolerance (Gi) Diarrhea   Metformin      REACTION: gi side effects   Sulfamethoxazole     REACTION: questionable    Review of Systems: Negative except as noted in the HPI.  Objective:  There were no vitals filed for this visit. Anita Mccormick is a pleasant 78 y.o. female in NAD. AAO x 3.  Vascular Examination: CFT <4 seconds b/l. DP/PT pulses faintly palpable b/l. Skin temperature gradient warm to warm b/l. No pain with calf compression. No ischemia or gangrene. No cyanosis or clubbing noted b/l.    Neurological Examination:Pt has subjective symptoms of neuropathy. Protective sensation decreased with 10 gram monofilament b/l. Vibratory sensation intact b/l.  Dermatological Examination: Pedal skin warm and supple b/l.   No open wounds. No interdigital macerations.  Toenails 1-5 b/l thick, discolored, elongated with subungual debris and pain on dorsal  palpation.    Hyperkeratotic lesion(s) right heel and lateral nailfold bilateral 5th digits.  No erythema, no edema, no drainage, no fluctuance.   Musculoskeletal Examination: Muscle strength 5/5 to all lower extremity muscle groups bilaterally. Adductovarus deformity L 5th toe and R 5th toe. Pes planus deformity noted bilateral LE.  Radiographs: None Assessment/Plan: 1. Pain due to onychomycosis of toenails of both feet   2. Corns and callosities   3. Type II diabetes mellitus with neurological manifestations Anita Mccormick)   Patient was evaluated and treated. All patient's and/or POA's questions/concerns addressed on today's visit. Mycotic toenails 1-5 b/l debrided in length and girth without incident. Corn(s)/Callus(es) lateral DIPJ of bilateral 5th toes pared with sharp debridement without incident. Continue daily foot inspections and monitor blood glucose per PCP/Endocrinologist's recommendations.Continue soft, supportive shoe gear daily. Report any pedal injuries to medical professional. Call office if there are any questions/concerns. -Patient/POA to call should there be question/concern in the interim.   Return in about 3 months (around 09/21/2024).  Anita Mccormick, DPM      [facility] LOCATION: [address], [place] [address]                   Office [phone]   [facility] LOCATION: [address] [place], [place] [address] Office [phone]

## 2024-06-28 ENCOUNTER — Inpatient Hospital Stay: Attending: Oncology

## 2024-06-28 ENCOUNTER — Other Ambulatory Visit: Payer: Self-pay | Admitting: Oncology

## 2024-06-28 ENCOUNTER — Other Ambulatory Visit (HOSPITAL_BASED_OUTPATIENT_CLINIC_OR_DEPARTMENT_OTHER): Payer: Self-pay

## 2024-06-28 DIAGNOSIS — D500 Iron deficiency anemia secondary to blood loss (chronic): Secondary | ICD-10-CM | POA: Insufficient documentation

## 2024-06-28 DIAGNOSIS — K7460 Unspecified cirrhosis of liver: Secondary | ICD-10-CM | POA: Diagnosis not present

## 2024-06-28 DIAGNOSIS — K5900 Constipation, unspecified: Secondary | ICD-10-CM | POA: Diagnosis not present

## 2024-06-28 DIAGNOSIS — Z853 Personal history of malignant neoplasm of breast: Secondary | ICD-10-CM | POA: Insufficient documentation

## 2024-06-28 DIAGNOSIS — I2510 Atherosclerotic heart disease of native coronary artery without angina pectoris: Secondary | ICD-10-CM | POA: Diagnosis not present

## 2024-06-28 DIAGNOSIS — D696 Thrombocytopenia, unspecified: Secondary | ICD-10-CM | POA: Insufficient documentation

## 2024-06-28 DIAGNOSIS — E119 Type 2 diabetes mellitus without complications: Secondary | ICD-10-CM | POA: Diagnosis not present

## 2024-06-28 DIAGNOSIS — K5521 Angiodysplasia of colon with hemorrhage: Secondary | ICD-10-CM | POA: Insufficient documentation

## 2024-06-28 LAB — CBC WITH DIFFERENTIAL (CANCER CENTER ONLY)
Abs Immature Granulocytes: 0.01 K/uL (ref 0.00–0.07)
Basophils Absolute: 0 K/uL (ref 0.0–0.1)
Basophils Relative: 1 %
Eosinophils Absolute: 1.2 K/uL — ABNORMAL HIGH (ref 0.0–0.5)
Eosinophils Relative: 24 %
HCT: 23.8 % — ABNORMAL LOW (ref 36.0–46.0)
Hemoglobin: 7 g/dL — ABNORMAL LOW (ref 12.0–15.0)
Immature Granulocytes: 0 %
Lymphocytes Relative: 11 %
Lymphs Abs: 0.6 K/uL — ABNORMAL LOW (ref 0.7–4.0)
MCH: 26.9 pg (ref 26.0–34.0)
MCHC: 29.4 g/dL — ABNORMAL LOW (ref 30.0–36.0)
MCV: 91.5 fL (ref 80.0–100.0)
Monocytes Absolute: 0.5 K/uL (ref 0.1–1.0)
Monocytes Relative: 10 %
Neutro Abs: 2.8 K/uL (ref 1.7–7.7)
Neutrophils Relative %: 54 %
Platelet Count: 141 K/uL — ABNORMAL LOW (ref 150–400)
RBC: 2.6 MIL/uL — ABNORMAL LOW (ref 3.87–5.11)
RDW: 14.8 % (ref 11.5–15.5)
WBC Count: 5.1 K/uL (ref 4.0–10.5)
nRBC: 0 % (ref 0.0–0.2)

## 2024-06-28 MED ORDER — LIDOCAINE-PRILOCAINE 2.5-2.5 % EX CREA
1.0000 | TOPICAL_CREAM | CUTANEOUS | 2 refills | Status: AC | PRN
  Filled 2024-06-28: qty 30, 30d supply, fill #0

## 2024-06-28 NOTE — Patient Instructions (Signed)
 Lidocaine  cream instructions:  Put on gloves Put quarter size amount of cream onto port Do not rub in Place bandage over cream so it doesn't get on your clothes Do this 1 HOUR before coming in for appointment  Implanted Port Insertion, Care After The following information offers guidance on how to care for yourself after your procedure. Your health care provider may also give you more specific instructions. If you have problems or questions, contact your health care provider. What can I expect after the procedure? After the procedure, it is common to have: Discomfort at the port insertion site. Bruising on the skin over the port. This should improve over 3-4 days. Follow these instructions at home: [facility] care After your port is placed, you will get a manufacturer's information card. The card has information about your port. Keep this card with you at all times. Take care of the port as told by your health care provider. Ask your health care provider if you or a family member can get training for taking care of the port at home. A home health care nurse will be be available to help care for the port. Make sure to remember what type of port you have. Incision care     Follow instructions from your health care provider about how to take care of your port insertion site. Make sure you: Wash your hands with soap and water for at least 20 seconds before and after you change your bandage (dressing). If soap and water are not available, use hand sanitizer. Change your dressing as told by your health care provider. Leave stitches (sutures), skin glue, or adhesive strips in place. These skin closures may need to stay in place for 2 weeks or longer. If adhesive strip edges start to loosen and curl up, you may trim the loose edges. Do not remove adhesive strips completely unless your health care provider tells you to do that. Check your port insertion site every day for signs of infection. Check  for: Redness, swelling, or pain. Fluid or blood. Warmth. Pus or a bad smell. Activity Return to your normal activities as told by your health care provider. Ask your health care provider what activities are safe for you. You may have to avoid lifting. Ask your health care provider how much you can safely lift. General instructions Take over-the-counter and prescription medicines only as told by your health care provider. Do not take baths, swim, or use a hot tub until your health care provider approves. Ask your health care provider if you may take showers. You may only be allowed to take sponge baths. If you were given a sedative during the procedure, it can affect you for several hours. Do not drive or operate machinery until your health care provider says that it is safe. Wear a medical alert bracelet in case of an emergency. This will tell any health care providers that you have a port. Keep all follow-up visits. This is important. Contact a health care provider if: You cannot flush your port with saline as directed, or you cannot draw blood from the port. You have a fever or chills. You have redness, swelling, or pain around your port insertion site. You have fluid or blood coming from your port insertion site. Your port insertion site feels warm to the touch. You have pus or a bad smell coming from the port insertion site. Get help right away if: You have chest pain or shortness of breath. You have bleeding from your  port that you cannot control. These symptoms may be an emergency. Get help right away. Call 911. Do not wait to see if the symptoms will go away. Do not drive yourself to the hospital. Summary Take care of the port as told by your health care provider. Keep the manufacturer's information card with you at all times. Change your dressing as told by your health care provider. Contact a health care provider if you have a fever or chills or if you have redness, swelling,  or pain around your port insertion site. Keep all follow-up visits. This information is not intended to replace advice given to you by your health care provider. Make sure you discuss any questions you have with your health care provider. Document Revised: 01/15/2021 Document Reviewed: 01/15/2021 Elsevier Patient Education  2024 [employer].

## 2024-07-08 ENCOUNTER — Other Ambulatory Visit (HOSPITAL_BASED_OUTPATIENT_CLINIC_OR_DEPARTMENT_OTHER): Payer: Self-pay

## 2024-07-12 ENCOUNTER — Inpatient Hospital Stay

## 2024-07-12 ENCOUNTER — Inpatient Hospital Stay: Admitting: Oncology

## 2024-07-12 VITALS — BP 135/61 | HR 84 | Temp 98.7°F | Resp 16 | Wt 141.2 lb

## 2024-07-12 DIAGNOSIS — D500 Iron deficiency anemia secondary to blood loss (chronic): Secondary | ICD-10-CM

## 2024-07-12 DIAGNOSIS — K5521 Angiodysplasia of colon with hemorrhage: Secondary | ICD-10-CM | POA: Diagnosis not present

## 2024-07-12 LAB — CBC WITH DIFFERENTIAL (CANCER CENTER ONLY)
Abs Immature Granulocytes: 0.01 K/uL (ref 0.00–0.07)
Basophils Absolute: 0 K/uL (ref 0.0–0.1)
Basophils Relative: 1 %
Eosinophils Absolute: 1.2 K/uL — ABNORMAL HIGH (ref 0.0–0.5)
Eosinophils Relative: 21 %
HCT: 24.7 % — ABNORMAL LOW (ref 36.0–46.0)
Hemoglobin: 7.2 g/dL — ABNORMAL LOW (ref 12.0–15.0)
Immature Granulocytes: 0 %
Lymphocytes Relative: 12 %
Lymphs Abs: 0.6 K/uL — ABNORMAL LOW (ref 0.7–4.0)
MCH: 26.9 pg (ref 26.0–34.0)
MCHC: 29.1 g/dL — ABNORMAL LOW (ref 30.0–36.0)
MCV: 92.2 fL (ref 80.0–100.0)
Monocytes Absolute: 0.5 K/uL (ref 0.1–1.0)
Monocytes Relative: 10 %
Neutro Abs: 3.1 K/uL (ref 1.7–7.7)
Neutrophils Relative %: 56 %
Platelet Count: 141 K/uL — ABNORMAL LOW (ref 150–400)
RBC: 2.68 MIL/uL — ABNORMAL LOW (ref 3.87–5.11)
RDW: 15.8 % — ABNORMAL HIGH (ref 11.5–15.5)
WBC Count: 5.6 K/uL (ref 4.0–10.5)
nRBC: 0 % (ref 0.0–0.2)

## 2024-07-12 LAB — IRON AND TIBC
Iron: 20 ug/dL — ABNORMAL LOW (ref 28–170)
Saturation Ratios: 5 % — ABNORMAL LOW (ref 10.4–31.8)
TIBC: 433 ug/dL (ref 250–450)
UIBC: 412 ug/dL

## 2024-07-12 LAB — FERRITIN: Ferritin: 27 ng/mL (ref 11–307)

## 2024-07-12 NOTE — Progress Notes (Signed)
 "  [facility]  HEMATOLOGY CLINIC PROGRESS NOTE  PATIENT NAME: Anita Mccormick   MR#: [id number] DOB: Dec 04, 1945  Patient Care Team: Mercer Clotilda SAUNDERS, MD as PCP - General (Family Medicine) Jordan, Peter M, MD as PCP - Cardiology (Cardiology) Octavia Bruckner, MD as Consulting Physician (Ophthalmology) Lionell Jon DEL, [facility] (Pharmacist)  Date of visit: 07/12/2024   ASSESSMENT & PLAN:   Anita Mccormick is a 78 y.o. lady with a past medical history of acute on chronic anemia due to gastrointestinal bleeding, history of gastric and small bowel AVMs/telangiectasias, liver cirrhosis, CAD s/p PEA arrest and NSTEMI during hospitalization in 09/2023, congestive heart failure, history of CVA, diabetes mellitus, hypertension, Hx of left breast cancer in early 2000s, s/p mastectomy and axillary LN dissection, did not receive adjuvant chemo, was referred to our service in June 2025 for evaluation of iron  deficiency anemia.     Anemia due to chronic blood loss Chronic anemia with a history of low hemoglobin levels, previously as low as 5.4 in May 2025, improved to 8.1 after transfusions.  Likely due to gastrointestinal bleeding causing slow blood loss.   Currently on oral iron  supplements (Fergon) every other day, tolerated well without constipation.   Occasional black stools and minor blood traces when wiping reported, possibly due to hemorrhoids, though she denies a history of hemorrhoids.  On her consultation with us  on 01/25/2024, labs revealed persistent anemia with hemoglobin of 7.7, MCV 97.  White count and platelet count were normal. Iron  studies continued to show evidence of iron  deficiency with iron  saturation of 5%, iron  decreased to 23.  B12, folate were within normal limits.  LDH borderline at 217.   Given persistent iron  deficiency anemia despite oral iron  supplementation, we proceeded with IV iron  using Venofer  x 5 doses, last dose on 03/01/2024.  On 04/26/2024, workup was  negative for monoclonal gammopathy including SPEP, serum free light chains.  It is likely that ongoing anemia is from occult GI blood loss from AVMs.  Oral iron  supplementation is ongoing, with constipation managed by stool softeners. Transfusions are minimized to prevent future rejection due to minor antigens.  Labs today revealed hemoglobin of 7.2, MCV 92.  White count and platelet count normal.  Iron  studies showed evidence of iron  deficiency with iron  saturation of 5%, iron  decreased to 20, ferritin 27.  We will submit request for IV iron  approval and proceed with IV iron  using either Feraheme or Venofer .  - Continue oral iron  supplementation once every other day. - Monitor hemoglobin levels every two weeks.  - Scheduled follow-up in six weeks.   Thrombocytopenia Platelet count is slightly low at 141,000, fluctuating over time. Possible relation to liver issues. White blood cell count is normal. - Okay to proceed with dental procedures given stable platelet count.  Arteriovenous malformations of gastrointestinal tract Capsule endoscopy previously identified small AVMs in the gastrointestinal tract, which may intermittently bleed and contribute to anemia. She is unaware of any overt bleeding, but chronic blood loss from these AVMs is a concern.  Cirrhosis of liver Recent ultrasound indicates cirrhosis of the liver, previously diagnosed as fatty liver. Denies alcohol use, suggesting non-alcoholic causes such as fatty liver disease or other etiologies. Hepatitis testing was last performed in 2014, and it is unclear if recent testing has been done by the GI team.  I spent a total of 30 minutes during this encounter with the patient including review of chart and various tests results, discussions about plan of care  and coordination of care plan.  I reviewed lab results and outside records for this visit and discussed relevant results with the patient. Diagnosis, plan of care and treatment  options were also discussed in detail with the patient. Opportunity provided to ask questions and answers provided to her apparent satisfaction. Provided instructions to call our clinic with any problems, questions or concerns prior to return visit. I recommended to continue follow-up with PCP and sub-specialists. She verbalized understanding and agreed with the plan. No barriers to learning was detected.  Chinita Patten, MD  07/12/2024 3:06 PM  [facility] - A DEPT OF [name] [id number] [place] [address] Dept: [phone] Dept Fax: [phone]   CHIEF COMPLAINT/ REASON FOR VISIT:  Follow-up for chronic anemia, likely from occult GI blood loss.  Treated with IV iron   INTERVAL HISTORY:  Discussed the use of AI scribe software for clinical note transcription with the patient, who gave verbal consent to proceed.  History of Present Illness  Anita Mccormick is a 78 year old female with chronic iron  deficiency anemia who presents for hematology follow-up and monitoring of hemoglobin levels.  She experiences persistent dyspnea with minimal exertion, requiring frequent rest, and reports ongoing fatigue. She continues to have bilateral foot pain.  She denies overt external blood loss and is uncertain about gastrointestinal bleeding. She takes oral iron  every other day and previously experienced constipation, but currently maintains regular bowel movements with dietary modifications. She has not required intravenous iron  infusions since July or August.  She describes right-sided abdominal pain, which she attributes to her sleeping position, and notes a prior unconfirmed diagnosis of hernia. She reports increased abdominal gas and right-sided abdominal asymmetry.  She applies a Band-Aid over her port site after bathing to prevent irritation. She is undergoing dental work, with a recent cavity treated and further  procedures planned. She did not eat lunch today but had a large, late breakfast.   SUMMARY OF HEMATOLOGIC HISTORY:  She was hospitalized 5/28-12/27/23 for acute on chronic anemia due to GIB.  Patient given 2 units PRBCs.  Hemoglobin improved to 9.0 at time of discharge.  Patient with history of AVM/angioectasis noted on EGD 4/15.  GI consulted.  Capsule endoscopy 12/25/2023.  No active bleeding appreciated but relatively fast transit time could not limit small findings such as AVMs.  Endoscopic intervention was not amenable.  She was advised to continue iron  supplementation and referral was sent to us  for further evaluation and management of iron  deficiency anemia.   She has undergone endoscopic evaluations and iron  infusions, though she experiences difficulty with venous access during these procedures, requiring multiple attempts to establish an IV line.   Her hemoglobin level improved to 8.1 g/dL in June 2025 from a previous low of 5.4 g/dL following transfusions. She experiences dizziness, shortness of breath, and coughing. No current chest pain, but ongoing shortness of breath is noted.   She takes Fergon, an iron  supplement, every other day as prescribed since her last hospital visit, and tolerates it well without constipation. She notes black stools and occasional traces of blood when wiping, but denies any history of hemorrhoids, epistaxis, or gum bleeding.   Her past medical history includes breast cancer treated with surgery approximately 20 years ago, without chemotherapy or radiation. She had implants placed and later removed due to rupture. She does not recall taking hormone-blocking medications post-surgery.   In March 2025, she experienced a rib fracture and  lung puncture during CPR, which required a chest tube. She expresses gratitude towards the medical staff involved in her care during that incident.  Chronic anemia with a history of low hemoglobin levels, previously as low as 5.4 in  May 2025, improved to 8.1 after transfusions.   Likely due to gastrointestinal bleeding causing slow blood loss.    Currently on oral iron  supplements (Fergon) every other day, tolerating well without constipation.    Occasional black stools and minor blood traces when wiping reported, possibly due to hemorrhoids, though she denies a history of hemorrhoids.   On her consultation with us  on 01/25/2024, labs revealed persistent anemia with hemoglobin of 7.7, MCV 97.  White count and platelet count were normal. Iron  studies continued to show evidence of iron  deficiency with iron  saturation of 5%, iron  decreased to 23.  B12, folate were within normal limits.  LDH borderline at 217.   Given persistent iron  deficiency anemia despite oral iron  supplementation, we proceeded with IV iron  using Venofer  x 5 doses.  On 04/26/2024, workup was negative for monoclonal gammopathy including SPEP, serum free light chains    I have reviewed the past medical history, past surgical history, social history and family history with the patient and they are unchanged from previous note.  ALLERGIES: She is allergic to bee venom, penicillins, ace inhibitors, chlorthalidone, lactose intolerance (gi), metformin , and sulfamethoxazole.  MEDICATIONS:  Current Outpatient Medications  Medication Sig Dispense Refill   ACCU-CHEK GUIDE TEST test strip 1 each by Other route 3 (three) times daily.     acetaminophen  (TYLENOL ) 325 MG tablet Take 2 tablets (650 mg total) by mouth every 6 (six) hours as needed for mild pain (pain score 1-3) (or Fever >/= 101).     aspirin  EC 81 MG tablet Take 81 mg by mouth daily. Swallow whole.     atorvastatin  (LIPITOR ) 80 MG tablet Take 1 tablet (80 mg total) by mouth daily. 90 tablet 3   azithromycin  (ZITHROMAX ) 250 MG tablet Take 2 tablets by mouth on day 1, then one tablet daily until complete. 6 each 0   benzonatate  (TESSALON ) 100 MG capsule Take 1 capsule (100 mg total) by mouth 2 (two) times  daily as needed for cough. 20 capsule 0   Blood Glucose Monitoring Suppl (ACCU-CHEK GUIDE ME) w/Device KIT Inject 1 Units into the skin 3 (three) times daily.     budesonide -glycopyrrolate-formoterol  (BREZTRI  AEROSPHERE) 160-9-4.8 MCG/ACT AERO inhaler Inhale 2 puffs into the lungs 2 (two) times daily. 32.1 g 3   clopidogrel  (PLAVIX ) 75 MG tablet Take 1 tablet (75 mg total) by mouth daily. 90 tablet 2   Continuous Glucose Sensor (DEXCOM G7 SENSOR) MISC CHANGE SENSOR EVERY 10 DAYS AS DIRECTED 9 each 3   Dulaglutide  (TRULICITY ) 1.5 MG/0.5ML SOAJ Inject 1.5 mg into the skin once a week.     ferrous gluconate  (FERGON) 324 MG tablet Take 1 tablet (324 mg total) by mouth every other day.     glipiZIDE  (GLUCOTROL  XL) 10 MG 24 hr tablet TAKE 1 TABLET TWICE DAILY 180 tablet 3   glucose 4 GM chewable tablet Chew 1 tablet by mouth as needed for low blood sugar.     lidocaine -prilocaine  (EMLA ) cream Apply 1 Application topically as needed. 30 g 2   metoprolol  succinate (TOPROL -XL) 25 MG 24 hr tablet Take 1 tablet (25 mg total) by mouth daily. 90 tablet 1   Multiple Vitamin (MULTIVITAMIN) tablet Take 1 tablet by mouth daily.     nitroGLYCERIN  (NITROSTAT )  0.4 MG SL tablet Place 1 tablet (0.4 mg total) under the tongue every 5 (five) minutes as needed for chest pain. 10 tablet 5   pantoprazole  (PROTONIX ) 40 MG tablet Take 1 tablet (40 mg total) by mouth daily. 90 tablet 3   No current facility-administered medications for this visit.     REVIEW OF SYSTEMS:    Review of Systems - Oncology  All other pertinent systems were reviewed with the patient and are negative.  PHYSICAL EXAMINATION:    Onc Performance Status - 07/12/24 1500       ECOG Perf Status   ECOG Perf Status Ambulatory and capable of all selfcare but unable to carry out any work activities.  Up and about more than 50% of waking hours      KPS SCALE   KPS % SCORE Cares for self, unable to carry on normal activity or to do active work           Vitals:   07/12/24 1456 07/12/24 1457  BP: (!) 148/62 135/61  Pulse: 84   Resp: 16   Temp: 98.7 F (37.1 C)   SpO2: 100%     Filed Weights   07/12/24 1456  Weight: 141 lb 3.2 oz (64 kg)     Physical Exam Constitutional:      General: She is not in acute distress.    Appearance: Normal appearance.  HENT:     Head: Normocephalic and atraumatic.  Cardiovascular:     Rate and Rhythm: Normal rate.  Pulmonary:     Effort: Pulmonary effort is normal. No respiratory distress.  Chest:     Comments: Port-A-Cath in place without any signs of infection. Abdominal:     General: There is no distension.  Neurological:     General: No focal deficit present.     Mental Status: She is alert and oriented to person, place, and time.  Psychiatric:        Mood and Affect: Mood normal.        Behavior: Behavior normal.     LABORATORY DATA:   I have reviewed the data as listed.  Results for orders placed or performed in visit on 07/12/24  CBC with Differential (Cancer Center Only)  Result Value Ref Range   WBC Count 5.6 4.0 - 10.5 K/uL   RBC 2.68 (L) 3.87 - 5.11 MIL/uL   Hemoglobin 7.2 (L) 12.0 - 15.0 g/dL   HCT 75.2 (L) 63.9 - 53.9 %   MCV 92.2 80.0 - 100.0 fL   MCH 26.9 26.0 - 34.0 pg   MCHC 29.1 (L) 30.0 - 36.0 g/dL   RDW 84.1 (H) 88.4 - 84.4 %   Platelet Count 141 (L) 150 - 400 K/uL   nRBC 0.0 0.0 - 0.2 %   Neutrophils Relative % 56 %   Neutro Abs 3.1 1.7 - 7.7 K/uL   Lymphocytes Relative 12 %   Lymphs Abs 0.6 (L) 0.7 - 4.0 K/uL   Monocytes Relative 10 %   Monocytes Absolute 0.5 0.1 - 1.0 K/uL   Eosinophils Relative 21 %   Eosinophils Absolute 1.2 (H) 0.0 - 0.5 K/uL   Basophils Relative 1 %   Basophils Absolute 0.0 0.0 - 0.1 K/uL   Immature Granulocytes 0 %   Abs Immature Granulocytes 0.01 0.00 - 0.07 K/uL  Ferritin  Result Value Ref Range   Ferritin 27 11 - 307 ng/mL  Iron  and TIBC  Result Value Ref Range   Iron  20 (L)  28 - 170 ug/dL   TIBC 566 749  - 549 ug/dL   Saturation Ratios 5 (L) 10.4 - 31.8 %   UIBC 412 ug/dL     RADIOGRAPHIC STUDIES:  IR IMAGING GUIDED PORT INSERTION INDICATION: 78 year old with chronic anemia and requires frequent blood draws and infusions. Request for a Port-A-Cath.  EXAM: FLUOROSCOPIC AND ULTRASOUND GUIDED PLACEMENT OF A SUBCUTANEOUS PORT  MEDICATIONS: Moderate sedation  ANESTHESIA/SEDATION: Moderate (conscious) sedation was employed during this procedure. A total of Versed  2 mg and fentanyl  100 mcg was administered intravenously at the order of the provider performing the procedure.  Total intra-service moderate sedation time: 26 minutes.  Patient's level of consciousness and vital signs were monitored continuously by radiology nurse throughout the procedure under the supervision of the provider performing the procedure.  FLUOROSCOPY TIME:  Radiation Exposure Index (as provided by the fluoroscopic device): 1 mGy Kerma  COMPLICATIONS: None immediate.  PROCEDURE: The procedure, risks, benefits, and alternatives were explained to the patient. Questions regarding the procedure were encouraged and answered. The patient understands and consents to the procedure.  Patient was placed supine on the interventional table. Ultrasound confirmed a patent right internal jugular vein. Ultrasound image was saved for documentation. The right chest and neck were cleaned with a skin antiseptic and a sterile drape was placed. Maximal barrier sterile technique was utilized including caps, mask, sterile gowns, sterile gloves, sterile drape, hand hygiene and skin antiseptic. The right neck was anesthetized with 1% lidocaine . Small incision was made in the right neck with a blade. Micropuncture set was placed in the right internal jugular vein with ultrasound guidance. The micropuncture wire was used for measurement purposes. The right chest was anesthetized with 1% lidocaine  with epinephrine . #15  blade was used to make an incision and a subcutaneous port pocket was formed. 8 french Power Port was assembled. Subcutaneous tunnel was formed with a stiff tunneling device. The port catheter was brought through the subcutaneous tunnel. The port was placed in the subcutaneous pocket. The micropuncture set was exchanged for a peel-away sheath. The catheter was placed through the peel-away sheath and the tip was positioned at the superior cavoatrial junction. Catheter placement was confirmed with fluoroscopy. The port was accessed and flushed with heparinized saline. The port pocket was closed using two layers of absorbable sutures and Dermabond. The vein skin site was closed using a single layer of absorbable suture and Dermabond. Sterile dressings were applied. Patient tolerated the procedure well without an immediate complication. Ultrasound and fluoroscopic images were taken and saved for this procedure.  IMPRESSION: Placement of a subcutaneous power-injectable port device. Catheter tip at the superior cavoatrial junction.  Electronically Signed   By: Juliene Balder M.D.   On: 05/10/2024 20:20    Orders Placed This Encounter  Procedures   Hemoglobin and Hematocrit (Cancer Center Only)    Standing Status:   Standing    Number of Occurrences:   6    Expiration Date:   07/12/2025   CBC with Differential (Cancer Center Only)    Standing Status:   Future    Expected Date:   08/23/2024    Expiration Date:   11/21/2024   Iron  and TIBC    Standing Status:   Future    Expected Date:   08/23/2024    Expiration Date:   11/21/2024   Ferritin    Standing Status:   Future    Expected Date:   08/23/2024    Expiration Date:   11/21/2024  Future Appointments  Date Time Provider Department Center  09/21/2024 11:00 AM Mercer Clotilda SAUNDERS, MD [facility] [address]  10/05/2024  1:45 PM Gaynel Delon LITTIE ROSENA [facility]  03/22/2025 12:20 PM Alvia Norleen BIRCH, MD [facility] None    This  document was completed utilizing speech recognition software. Grammatical errors, random word insertions, pronoun errors, and incomplete sentences are an occasional consequence of this system due to software limitations, ambient noise, and hardware issues. Any formal questions or concerns about the content, text or information contained within the body of this dictation should be directly addressed to the provider for clarification.  "

## 2024-07-12 NOTE — Patient Instructions (Signed)

## 2024-07-12 NOTE — Assessment & Plan Note (Addendum)
 Chronic anemia with a history of low hemoglobin levels, previously as low as 5.4 in May 2025, improved to 8.1 after transfusions.  Likely due to gastrointestinal bleeding causing slow blood loss.   Currently on oral iron  supplements (Fergon) every other day, tolerated well without constipation.   Occasional black stools and minor blood traces when wiping reported, possibly due to hemorrhoids, though she denies a history of hemorrhoids.  On her consultation with us  on 01/25/2024, labs revealed persistent anemia with hemoglobin of 7.7, MCV 97.  White count and platelet count were normal. Iron  studies continued to show evidence of iron  deficiency with iron  saturation of 5%, iron  decreased to 23.  B12, folate were within normal limits.  LDH borderline at 217.   Given persistent iron  deficiency anemia despite oral iron  supplementation, we proceeded with IV iron  using Venofer  x 5 doses, last dose on 03/01/2024.  On 04/26/2024, workup was negative for monoclonal gammopathy including SPEP, serum free light chains.  It is likely that ongoing anemia is from occult GI blood loss from AVMs.  Oral iron  supplementation is ongoing, with constipation managed by stool softeners. Transfusions are minimized to prevent future rejection due to minor antigens.  Labs today revealed hemoglobin of 7.2, MCV 92.  White count and platelet count normal.  Iron  studies showed evidence of iron  deficiency with iron  saturation of 5%, iron  decreased to 20, ferritin 27.  We will submit request for IV iron  approval and proceed with IV iron  using either Feraheme or Venofer .  - Continue oral iron  supplementation once every other day. - Monitor hemoglobin levels every two weeks.  - Scheduled follow-up in six weeks.

## 2024-07-13 ENCOUNTER — Ambulatory Visit: Admitting: Podiatry

## 2024-07-15 ENCOUNTER — Other Ambulatory Visit (HOSPITAL_BASED_OUTPATIENT_CLINIC_OR_DEPARTMENT_OTHER): Payer: Self-pay

## 2024-07-21 ENCOUNTER — Encounter: Payer: Self-pay | Admitting: Oncology

## 2024-07-22 ENCOUNTER — Telehealth: Payer: Self-pay

## 2024-07-22 ENCOUNTER — Other Ambulatory Visit: Payer: Self-pay | Admitting: Oncology

## 2024-07-22 NOTE — Telephone Encounter (Signed)
 Followed up with patient and reviewed plan to manage her iron  deficiency with IV iron  infusions and monitoring per Dr. Autumn. Patient is aware of her iron  infusion appt at 1pm this Monday and agrees with scheduling an plan.

## 2024-07-25 ENCOUNTER — Other Ambulatory Visit: Payer: Self-pay

## 2024-07-25 ENCOUNTER — Inpatient Hospital Stay

## 2024-07-25 ENCOUNTER — Telehealth: Payer: Self-pay

## 2024-07-25 DIAGNOSIS — D500 Iron deficiency anemia secondary to blood loss (chronic): Secondary | ICD-10-CM

## 2024-07-25 DIAGNOSIS — K5521 Angiodysplasia of colon with hemorrhage: Secondary | ICD-10-CM | POA: Diagnosis not present

## 2024-07-25 LAB — CBC WITH DIFFERENTIAL (CANCER CENTER ONLY)
Abs Immature Granulocytes: 0.02 K/uL (ref 0.00–0.07)
Basophils Absolute: 0 K/uL (ref 0.0–0.1)
Basophils Relative: 1 %
Eosinophils Absolute: 1.3 K/uL — ABNORMAL HIGH (ref 0.0–0.5)
Eosinophils Relative: 25 %
HCT: 22.9 % — ABNORMAL LOW (ref 36.0–46.0)
Hemoglobin: 6.6 g/dL — CL (ref 12.0–15.0)
Immature Granulocytes: 0 %
Lymphocytes Relative: 10 %
Lymphs Abs: 0.5 K/uL — ABNORMAL LOW (ref 0.7–4.0)
MCH: 26.1 pg (ref 26.0–34.0)
MCHC: 28.8 g/dL — ABNORMAL LOW (ref 30.0–36.0)
MCV: 90.5 fL (ref 80.0–100.0)
Monocytes Absolute: 0.5 K/uL (ref 0.1–1.0)
Monocytes Relative: 11 %
Neutro Abs: 2.6 K/uL (ref 1.7–7.7)
Neutrophils Relative %: 53 %
Platelet Count: 139 K/uL — ABNORMAL LOW (ref 150–400)
RBC: 2.53 MIL/uL — ABNORMAL LOW (ref 3.87–5.11)
RDW: 14.9 % (ref 11.5–15.5)
WBC Count: 5 K/uL (ref 4.0–10.5)
nRBC: 0 % (ref 0.0–0.2)

## 2024-07-25 LAB — IRON AND TIBC
Iron: 20 ug/dL — ABNORMAL LOW (ref 28–170)
Saturation Ratios: 5 % — ABNORMAL LOW (ref 10.4–31.8)
TIBC: 417 ug/dL (ref 250–450)
UIBC: 398 ug/dL

## 2024-07-25 LAB — FERRITIN: Ferritin: 18 ng/mL (ref 11–307)

## 2024-07-25 NOTE — Patient Instructions (Signed)

## 2024-07-25 NOTE — Telephone Encounter (Signed)
 Spoke and confirmed with patient flush appointment on Wednesday 07/27/2024 at 4 pm for Type and Cross after her Dental appointment at 1 pm. Patient is also aware of her Blood transfusion appointment on Friday 07/30/2023 at 0900 for 2 units PRBCs. Patient aware of these appointment dates and times and agreeable.

## 2024-07-25 NOTE — Progress Notes (Signed)
 CRITICAL VALUE STICKER  CRITICAL VALUE: HGB 6.6  RECEIVER (on-site recipient of call): Keene Crown, RN  DATE & TIME NOTIFIED:  07/25/2024  1355  MESSENGER (representative from lab): Lynwood MD NOTIFIED:  Dr. Cloretta and Olam Ned TIME OF NOTIFICATION:  1357  RESPONSE:  Aware

## 2024-07-27 ENCOUNTER — Inpatient Hospital Stay

## 2024-07-27 VITALS — BP 144/51 | HR 65 | Temp 97.7°F | Resp 13

## 2024-07-27 DIAGNOSIS — K5521 Angiodysplasia of colon with hemorrhage: Secondary | ICD-10-CM | POA: Diagnosis not present

## 2024-07-27 DIAGNOSIS — D500 Iron deficiency anemia secondary to blood loss (chronic): Secondary | ICD-10-CM

## 2024-07-27 DIAGNOSIS — D649 Anemia, unspecified: Secondary | ICD-10-CM

## 2024-07-27 LAB — PREPARE RBC (CROSSMATCH)

## 2024-07-29 ENCOUNTER — Inpatient Hospital Stay: Attending: Oncology

## 2024-07-29 DIAGNOSIS — K5521 Angiodysplasia of colon with hemorrhage: Secondary | ICD-10-CM | POA: Diagnosis present

## 2024-07-29 DIAGNOSIS — D649 Anemia, unspecified: Secondary | ICD-10-CM

## 2024-07-29 DIAGNOSIS — D500 Iron deficiency anemia secondary to blood loss (chronic): Secondary | ICD-10-CM | POA: Insufficient documentation

## 2024-07-29 MED ORDER — SODIUM CHLORIDE 0.9% IV SOLUTION
250.0000 mL | INTRAVENOUS | Status: DC
  Administered 2024-07-29: 250 mL via INTRAVENOUS

## 2024-07-29 MED ORDER — ACETAMINOPHEN 325 MG PO TABS
650.0000 mg | ORAL_TABLET | Freq: Once | ORAL | Status: AC
  Administered 2024-07-29: 650 mg via ORAL

## 2024-07-29 MED ORDER — SODIUM CHLORIDE 0.9% FLUSH
10.0000 mL | INTRAVENOUS | Status: AC | PRN
  Administered 2024-07-29: 10 mL

## 2024-07-29 MED ORDER — DIPHENHYDRAMINE HCL 25 MG PO CAPS
25.0000 mg | ORAL_CAPSULE | Freq: Once | ORAL | Status: AC
  Administered 2024-07-29: 25 mg via ORAL
  Filled 2024-07-29: qty 1

## 2024-07-29 NOTE — Patient Instructions (Signed)

## 2024-08-01 LAB — TYPE AND SCREEN
ABO/RH(D): B POS
Antibody Screen: NEGATIVE
Unit division: 0

## 2024-08-01 LAB — BPAM RBC
Blood Product Expiration Date: 202601282359
ISSUE DATE / TIME: 202601020646
Unit Type and Rh: 7300

## 2024-08-05 ENCOUNTER — Inpatient Hospital Stay

## 2024-08-05 VITALS — BP 170/48 | HR 95 | Temp 97.7°F | Resp 98

## 2024-08-05 DIAGNOSIS — K5521 Angiodysplasia of colon with hemorrhage: Secondary | ICD-10-CM | POA: Diagnosis not present

## 2024-08-05 DIAGNOSIS — D649 Anemia, unspecified: Secondary | ICD-10-CM

## 2024-08-05 MED ORDER — DIPHENHYDRAMINE HCL 25 MG PO CAPS
25.0000 mg | ORAL_CAPSULE | Freq: Once | ORAL | Status: AC
  Administered 2024-08-05: 25 mg via ORAL
  Filled 2024-08-05: qty 1

## 2024-08-05 MED ORDER — IRON SUCROSE 20 MG/ML IV SOLN
200.0000 mg | Freq: Once | INTRAVENOUS | Status: AC
  Administered 2024-08-05: 200 mg via INTRAVENOUS
  Filled 2024-08-05: qty 10

## 2024-08-05 MED ORDER — ACETAMINOPHEN 325 MG PO TABS
650.0000 mg | ORAL_TABLET | Freq: Once | ORAL | Status: AC
  Administered 2024-08-05: 650 mg via ORAL
  Filled 2024-08-05: qty 2

## 2024-08-05 MED ORDER — SODIUM CHLORIDE 0.9 % IV SOLN: INTRAVENOUS | Status: DC

## 2024-08-05 NOTE — Patient Instructions (Signed)

## 2024-08-12 ENCOUNTER — Inpatient Hospital Stay

## 2024-08-12 VITALS — BP 136/52 | HR 77 | Temp 97.9°F | Resp 18

## 2024-08-12 DIAGNOSIS — K5521 Angiodysplasia of colon with hemorrhage: Secondary | ICD-10-CM | POA: Diagnosis not present

## 2024-08-12 DIAGNOSIS — D649 Anemia, unspecified: Secondary | ICD-10-CM

## 2024-08-12 MED ORDER — DIPHENHYDRAMINE HCL 25 MG PO CAPS
25.0000 mg | ORAL_CAPSULE | Freq: Once | ORAL | Status: AC
  Administered 2024-08-12: 25 mg via ORAL
  Filled 2024-08-12: qty 1

## 2024-08-12 MED ORDER — IRON SUCROSE 20 MG/ML IV SOLN
200.0000 mg | Freq: Once | INTRAVENOUS | Status: AC
  Administered 2024-08-12: 200 mg via INTRAVENOUS
  Filled 2024-08-12: qty 10

## 2024-08-12 MED ORDER — ACETAMINOPHEN 325 MG PO TABS
650.0000 mg | ORAL_TABLET | Freq: Once | ORAL | Status: AC
  Administered 2024-08-12: 650 mg via ORAL
  Filled 2024-08-12: qty 2

## 2024-08-12 MED ORDER — SODIUM CHLORIDE 0.9 % IV SOLN: INTRAVENOUS | Status: DC

## 2024-08-12 NOTE — Patient Instructions (Signed)

## 2024-08-18 ENCOUNTER — Telehealth: Payer: Self-pay | Admitting: Oncology

## 2024-08-18 NOTE — Telephone Encounter (Signed)
 Called PT due to the weather on 1/26;updated appt day and time confirmed.

## 2024-08-19 ENCOUNTER — Inpatient Hospital Stay

## 2024-08-19 VITALS — BP 153/51 | HR 83 | Temp 98.7°F | Resp 16

## 2024-08-19 DIAGNOSIS — D649 Anemia, unspecified: Secondary | ICD-10-CM

## 2024-08-19 DIAGNOSIS — K5521 Angiodysplasia of colon with hemorrhage: Secondary | ICD-10-CM | POA: Diagnosis not present

## 2024-08-19 MED ORDER — IRON SUCROSE 20 MG/ML IV SOLN
200.0000 mg | Freq: Once | INTRAVENOUS | Status: AC
  Administered 2024-08-19: 200 mg via INTRAVENOUS
  Filled 2024-08-19: qty 10

## 2024-08-19 MED ORDER — DIPHENHYDRAMINE HCL 25 MG PO CAPS
25.0000 mg | ORAL_CAPSULE | Freq: Once | ORAL | Status: AC
  Administered 2024-08-19: 25 mg via ORAL
  Filled 2024-08-19: qty 1

## 2024-08-19 MED ORDER — SODIUM CHLORIDE 0.9 % IV SOLN: INTRAVENOUS | Status: DC

## 2024-08-19 MED ORDER — ACETAMINOPHEN 325 MG PO TABS
650.0000 mg | ORAL_TABLET | Freq: Once | ORAL | Status: AC
  Administered 2024-08-19: 650 mg via ORAL
  Filled 2024-08-19: qty 2

## 2024-08-19 NOTE — Patient Instructions (Signed)

## 2024-08-22 ENCOUNTER — Inpatient Hospital Stay

## 2024-08-22 ENCOUNTER — Inpatient Hospital Stay: Admitting: Oncology

## 2024-08-22 ENCOUNTER — Telehealth: Payer: Self-pay

## 2024-08-22 DIAGNOSIS — D509 Iron deficiency anemia, unspecified: Secondary | ICD-10-CM

## 2024-08-22 MED ORDER — FERROUS GLUCONATE 324 (38 FE) MG PO TABS: 324.0000 mg | ORAL_TABLET | ORAL | 2 refills | Status: DC

## 2024-08-22 NOTE — Progress Notes (Signed)
" ° °  08/22/2024  Patient ID: Ronal DELENA Jurist, female   DOB: 30-Nov-1945, 79 y.o.   MRN: [id number]  Chart review shows patient is still needing to be re-enrolled in Breztri  and Trulicity  PAP for 2026, will coordinate with med assistance team.  Jon VEAR Lindau, PharmD Clinical Pharmacist [phone]  "

## 2024-08-22 NOTE — Telephone Encounter (Signed)
 Copied from CRM #[id number]. Topic: Clinical - Medication Refill >> Aug 22, 2024 12:06 PM Deleta RAMAN wrote: Medication: ferrous gluconate  (FERGON) 324 MG tablet  Has the patient contacted their pharmacy? Yes (Agent: If no, request that the patient contact the pharmacy for the refill. If patient does not wish to contact the pharmacy document the reason why and proceed with request.) (Agent: If yes, when and what did the pharmacy advise?)  This is the patient's preferred pharmacy:  [id number] - [place], [place] - [address] AT [facility] [address] [place] [address] Phone: [phone] Fax: [phone]    Is this the correct pharmacy for this prescription? Yes If no, delete pharmacy and type the correct one.   Has the prescription been filled recently? No  Is the patient out of the medication? Yes  Has the patient been seen for an appointment in the last year OR does the patient have an upcoming appointment? Yes  Can we respond through MyChart? No  Agent: Please be advised that Rx refills may take up to 3 business days. We ask that you follow-up with your pharmacy.

## 2024-08-23 ENCOUNTER — Inpatient Hospital Stay: Admitting: Oncology

## 2024-08-23 ENCOUNTER — Inpatient Hospital Stay

## 2024-08-24 ENCOUNTER — Telehealth: Payer: Self-pay

## 2024-08-24 ENCOUNTER — Other Ambulatory Visit (HOSPITAL_COMMUNITY): Payer: Self-pay

## 2024-08-24 NOTE — Telephone Encounter (Signed)
 PAP: Patient assistance application for Trulicity through [employer] has been mailed to pt's home address on file. Provider portion of application will be faxed to provider's office.

## 2024-08-24 NOTE — Telephone Encounter (Signed)
-----   Message from Jon VEAR Lindau sent at 08/22/2024  3:55 PM EST ----- Patient is due for renewal still of her Trulicity  1.5mg  PAP via [employer] and Breztri  PAP via [employer] for 2026. I do not see where any renewal has been coordinated for her yet. Can we reach out to patient to see if she would like these in the mail or to come by the office? Prescribed by PCP Dr. Mercer at [facility]

## 2024-08-24 NOTE — Telephone Encounter (Signed)
 PAP: Patient assistance application for Breztri  through [employer] ([employer]) has been mailed to pt's home address on file. Provider portion of application will be faxed to provider's office.

## 2024-08-24 NOTE — Progress Notes (Addendum)
" ° °  08/24/2024  Patient ID: Ronal DELENA Jurist, female   DOB: 13-Sep-1945, 79 y.o.   MRN: [id number]  Provider completed their portion of the Trulicity  2026 renewal application through [employer]. Faxed into CPhT for further processing.  Provider completed their portion of the Breztri  2026 renewal application through [employer]. Faxed into CPhT for further processing.  Jon VEAR Lindau, PharmD Clinical Pharmacist [phone]  "

## 2024-08-24 NOTE — Telephone Encounter (Signed)
 Received provider portion, awaiting patient portion.

## 2024-08-25 ENCOUNTER — Other Ambulatory Visit: Payer: Self-pay | Admitting: Family Medicine

## 2024-08-25 DIAGNOSIS — D509 Iron deficiency anemia, unspecified: Secondary | ICD-10-CM

## 2024-08-25 DIAGNOSIS — I10 Essential (primary) hypertension: Secondary | ICD-10-CM

## 2024-08-26 ENCOUNTER — Inpatient Hospital Stay

## 2024-08-26 ENCOUNTER — Other Ambulatory Visit: Payer: Self-pay

## 2024-08-26 VITALS — BP 168/61 | HR 76 | Temp 98.1°F | Resp 16

## 2024-08-26 DIAGNOSIS — D509 Iron deficiency anemia, unspecified: Secondary | ICD-10-CM

## 2024-08-26 DIAGNOSIS — D649 Anemia, unspecified: Secondary | ICD-10-CM

## 2024-08-26 DIAGNOSIS — K5521 Angiodysplasia of colon with hemorrhage: Secondary | ICD-10-CM | POA: Diagnosis not present

## 2024-08-26 MED ORDER — ACETAMINOPHEN 325 MG PO TABS
650.0000 mg | ORAL_TABLET | Freq: Once | ORAL | Status: AC
  Administered 2024-08-26: 650 mg via ORAL
  Filled 2024-08-26: qty 2

## 2024-08-26 MED ORDER — SODIUM CHLORIDE 0.9 % IV SOLN: INTRAVENOUS | Status: DC

## 2024-08-26 MED ORDER — DIPHENHYDRAMINE HCL 25 MG PO CAPS
25.0000 mg | ORAL_CAPSULE | Freq: Once | ORAL | Status: AC
  Administered 2024-08-26: 25 mg via ORAL
  Filled 2024-08-26: qty 1

## 2024-08-26 MED ORDER — IRON SUCROSE 20 MG/ML IV SOLN
200.0000 mg | Freq: Once | INTRAVENOUS | Status: AC
  Administered 2024-08-26: 200 mg via INTRAVENOUS
  Filled 2024-08-26: qty 10

## 2024-08-26 NOTE — Patient Instructions (Signed)

## 2024-09-02 ENCOUNTER — Inpatient Hospital Stay

## 2024-09-02 VITALS — BP 170/67 | HR 77 | Temp 98.2°F | Resp 16

## 2024-09-02 DIAGNOSIS — D649 Anemia, unspecified: Secondary | ICD-10-CM

## 2024-09-02 MED ORDER — IRON SUCROSE 20 MG/ML IV SOLN
200.0000 mg | Freq: Once | INTRAVENOUS | Status: AC
  Administered 2024-09-02: 200 mg via INTRAVENOUS
  Filled 2024-09-02: qty 10

## 2024-09-02 MED ORDER — SODIUM CHLORIDE 0.9 % IV SOLN: INTRAVENOUS | Status: DC

## 2024-09-02 MED ORDER — DIPHENHYDRAMINE HCL 25 MG PO CAPS
25.0000 mg | ORAL_CAPSULE | Freq: Once | ORAL | Status: AC
  Administered 2024-09-02: 25 mg via ORAL
  Filled 2024-09-02: qty 1

## 2024-09-02 MED ORDER — ACETAMINOPHEN 325 MG PO TABS
650.0000 mg | ORAL_TABLET | Freq: Once | ORAL | Status: AC
  Administered 2024-09-02: 650 mg via ORAL
  Filled 2024-09-02: qty 2

## 2024-09-02 NOTE — Patient Instructions (Signed)

## 2024-09-06 ENCOUNTER — Inpatient Hospital Stay

## 2024-09-06 ENCOUNTER — Inpatient Hospital Stay: Admitting: Oncology

## 2024-09-21 ENCOUNTER — Ambulatory Visit: Admitting: Family Medicine

## 2024-10-05 ENCOUNTER — Ambulatory Visit: Admitting: Podiatry

## 2025-03-22 ENCOUNTER — Encounter (INDEPENDENT_AMBULATORY_CARE_PROVIDER_SITE_OTHER): Admitting: Ophthalmology
